# Patient Record
Sex: Male | Born: 1960 | Race: Black or African American | Hispanic: No | Smoking: Current every day smoker
Health system: Southern US, Community
[De-identification: ages and names within clinical notes are randomized; demographics above are authoritative.]

## PROBLEM LIST (undated history)

## (undated) DIAGNOSIS — W3400XA Accidental discharge from unspecified firearms or gun, initial encounter: Secondary | ICD-10-CM

## (undated) DIAGNOSIS — I472 Ventricular tachycardia: Secondary | ICD-10-CM

## (undated) DIAGNOSIS — J189 Pneumonia, unspecified organism: Secondary | ICD-10-CM

## (undated) DIAGNOSIS — D649 Anemia, unspecified: Secondary | ICD-10-CM

## (undated) DIAGNOSIS — F32A Depression, unspecified: Secondary | ICD-10-CM

## (undated) DIAGNOSIS — R000 Tachycardia, unspecified: Secondary | ICD-10-CM

## (undated) DIAGNOSIS — I440 Atrioventricular block, first degree: Secondary | ICD-10-CM

## (undated) DIAGNOSIS — I10 Essential (primary) hypertension: Secondary | ICD-10-CM

## (undated) DIAGNOSIS — I714 Abdominal aortic aneurysm, without rupture, unspecified: Secondary | ICD-10-CM

## (undated) DIAGNOSIS — R7989 Other specified abnormal findings of blood chemistry: Secondary | ICD-10-CM

## (undated) DIAGNOSIS — Z992 Dependence on renal dialysis: Secondary | ICD-10-CM

## (undated) DIAGNOSIS — I441 Atrioventricular block, second degree: Secondary | ICD-10-CM

## (undated) DIAGNOSIS — F329 Major depressive disorder, single episode, unspecified: Secondary | ICD-10-CM

## (undated) DIAGNOSIS — D696 Thrombocytopenia, unspecified: Secondary | ICD-10-CM

## (undated) DIAGNOSIS — B1920 Unspecified viral hepatitis C without hepatic coma: Secondary | ICD-10-CM

## (undated) DIAGNOSIS — N186 End stage renal disease: Secondary | ICD-10-CM

## (undated) DIAGNOSIS — H409 Unspecified glaucoma: Secondary | ICD-10-CM

## (undated) DIAGNOSIS — I639 Cerebral infarction, unspecified: Secondary | ICD-10-CM

## (undated) HISTORY — PX: ORIF CALCANEAL FRACTURE: SUR921

## (undated) HISTORY — PX: WISDOM TOOTH EXTRACTION: SHX21

## (undated) HISTORY — PX: FRACTURE SURGERY: SHX138

## (undated) HISTORY — PX: EXPLORATORY LAPAROTOMY: SUR591

## (undated) HISTORY — PX: OTHER SURGICAL HISTORY: SHX169

---

## 1980-10-23 HISTORY — PX: LUNG SURGERY: SHX703

## 2016-11-23 DIAGNOSIS — I639 Cerebral infarction, unspecified: Secondary | ICD-10-CM

## 2016-11-23 HISTORY — DX: Cerebral infarction, unspecified: I63.9

## 2016-11-23 HISTORY — PX: INSERTION OF DIALYSIS CATHETER: SHX1324

## 2016-12-10 ENCOUNTER — Inpatient Hospital Stay (HOSPITAL_COMMUNITY)
Admission: AD | Admit: 2016-12-10 | Discharge: 2016-12-14 | DRG: 682 | Discharge status: Against Medical Advice | Payer: Medicaid Other | Source: Other Acute Inpatient Hospital | Attending: Internal Medicine | Admitting: Internal Medicine

## 2016-12-10 ENCOUNTER — Encounter (HOSPITAL_COMMUNITY): Payer: Self-pay | Admitting: Internal Medicine

## 2016-12-10 DIAGNOSIS — I471 Supraventricular tachycardia: Secondary | ICD-10-CM | POA: Diagnosis present

## 2016-12-10 DIAGNOSIS — G9340 Encephalopathy, unspecified: Secondary | ICD-10-CM

## 2016-12-10 DIAGNOSIS — I161 Hypertensive emergency: Secondary | ICD-10-CM | POA: Diagnosis present

## 2016-12-10 DIAGNOSIS — Z0189 Encounter for other specified special examinations: Secondary | ICD-10-CM

## 2016-12-10 DIAGNOSIS — Z9114 Patient's other noncompliance with medication regimen: Secondary | ICD-10-CM

## 2016-12-10 DIAGNOSIS — I158 Other secondary hypertension: Secondary | ICD-10-CM | POA: Diagnosis present

## 2016-12-10 DIAGNOSIS — F17210 Nicotine dependence, cigarettes, uncomplicated: Secondary | ICD-10-CM | POA: Diagnosis present

## 2016-12-10 DIAGNOSIS — M311 Thrombotic microangiopathy, unspecified: Secondary | ICD-10-CM | POA: Diagnosis present

## 2016-12-10 DIAGNOSIS — I639 Cerebral infarction, unspecified: Secondary | ICD-10-CM

## 2016-12-10 DIAGNOSIS — E871 Hypo-osmolality and hyponatremia: Secondary | ICD-10-CM | POA: Diagnosis not present

## 2016-12-10 DIAGNOSIS — D696 Thrombocytopenia, unspecified: Secondary | ICD-10-CM | POA: Diagnosis present

## 2016-12-10 DIAGNOSIS — I6300 Cerebral infarction due to thrombosis of unspecified precerebral artery: Secondary | ICD-10-CM | POA: Diagnosis not present

## 2016-12-10 DIAGNOSIS — I10 Essential (primary) hypertension: Secondary | ICD-10-CM | POA: Diagnosis not present

## 2016-12-10 DIAGNOSIS — I248 Other forms of acute ischemic heart disease: Secondary | ICD-10-CM | POA: Diagnosis present

## 2016-12-10 DIAGNOSIS — B1920 Unspecified viral hepatitis C without hepatic coma: Secondary | ICD-10-CM | POA: Diagnosis present

## 2016-12-10 DIAGNOSIS — I472 Ventricular tachycardia: Secondary | ICD-10-CM | POA: Diagnosis not present

## 2016-12-10 DIAGNOSIS — R4781 Slurred speech: Secondary | ICD-10-CM | POA: Diagnosis present

## 2016-12-10 DIAGNOSIS — I441 Atrioventricular block, second degree: Secondary | ICD-10-CM | POA: Diagnosis present

## 2016-12-10 DIAGNOSIS — Z452 Encounter for adjustment and management of vascular access device: Secondary | ICD-10-CM

## 2016-12-10 DIAGNOSIS — R066 Hiccough: Secondary | ICD-10-CM | POA: Diagnosis present

## 2016-12-10 DIAGNOSIS — R17 Unspecified jaundice: Secondary | ICD-10-CM | POA: Diagnosis present

## 2016-12-10 DIAGNOSIS — R748 Abnormal levels of other serum enzymes: Secondary | ICD-10-CM | POA: Diagnosis not present

## 2016-12-10 DIAGNOSIS — R778 Other specified abnormalities of plasma proteins: Secondary | ICD-10-CM | POA: Diagnosis present

## 2016-12-10 DIAGNOSIS — N19 Unspecified kidney failure: Secondary | ICD-10-CM

## 2016-12-10 DIAGNOSIS — Z1389 Encounter for screening for other disorder: Secondary | ICD-10-CM

## 2016-12-10 DIAGNOSIS — N179 Acute kidney failure, unspecified: Secondary | ICD-10-CM | POA: Diagnosis present

## 2016-12-10 DIAGNOSIS — D589 Hereditary hemolytic anemia, unspecified: Secondary | ICD-10-CM | POA: Diagnosis present

## 2016-12-10 DIAGNOSIS — R7989 Other specified abnormal findings of blood chemistry: Secondary | ICD-10-CM

## 2016-12-10 DIAGNOSIS — R112 Nausea with vomiting, unspecified: Secondary | ICD-10-CM | POA: Diagnosis present

## 2016-12-10 DIAGNOSIS — N189 Chronic kidney disease, unspecified: Secondary | ICD-10-CM | POA: Diagnosis present

## 2016-12-10 DIAGNOSIS — I159 Secondary hypertension, unspecified: Secondary | ICD-10-CM | POA: Diagnosis not present

## 2016-12-10 DIAGNOSIS — R000 Tachycardia, unspecified: Secondary | ICD-10-CM

## 2016-12-10 HISTORY — DX: Essential (primary) hypertension: I10

## 2016-12-10 LAB — RETICULOCYTES
RBC.: 4.84 MIL/uL (ref 4.22–5.81)
RETIC COUNT ABSOLUTE: 72.6 10*3/uL (ref 19.0–186.0)
Retic Ct Pct: 1.5 % (ref 0.4–3.1)

## 2016-12-10 LAB — COMPREHENSIVE METABOLIC PANEL
ALBUMIN: 3.5 g/dL (ref 3.5–5.0)
ALT: 30 U/L (ref 17–63)
AST: 41 U/L (ref 15–41)
Alkaline Phosphatase: 57 U/L (ref 38–126)
Anion gap: 16 — ABNORMAL HIGH (ref 5–15)
BUN: 93 mg/dL — ABNORMAL HIGH (ref 6–20)
CHLORIDE: 100 mmol/L — AB (ref 101–111)
CO2: 20 mmol/L — AB (ref 22–32)
CREATININE: 8.67 mg/dL — AB (ref 0.61–1.24)
Calcium: 9.1 mg/dL (ref 8.9–10.3)
GFR calc non Af Amer: 6 mL/min — ABNORMAL LOW (ref >60)
GFR, EST AFRICAN AMERICAN: 7 mL/min — AB (ref >60)
GLUCOSE: 147 mg/dL — AB (ref 65–99)
Potassium: 3.6 mmol/L (ref 3.5–5.1)
SODIUM: 136 mmol/L (ref 135–145)
Total Bilirubin: 1.8 mg/dL — ABNORMAL HIGH (ref 0.3–1.2)
Total Protein: 6.1 g/dL — ABNORMAL LOW (ref 6.5–8.1)

## 2016-12-10 LAB — CBC WITH DIFFERENTIAL/PLATELET
BASOS ABS: 0 10*3/uL (ref 0.0–0.1)
Basophils Relative: 0 %
Eosinophils Absolute: 0 10*3/uL (ref 0.0–0.7)
Eosinophils Relative: 0 %
HEMATOCRIT: 40.9 % (ref 39.0–52.0)
Hemoglobin: 14.6 g/dL (ref 13.0–17.0)
LYMPHS ABS: 1.2 10*3/uL (ref 0.7–4.0)
LYMPHS PCT: 15 %
MCH: 30.2 pg (ref 26.0–34.0)
MCHC: 35.7 g/dL (ref 30.0–36.0)
MCV: 84.5 fL (ref 78.0–100.0)
MONO ABS: 1.1 10*3/uL — AB (ref 0.1–1.0)
Monocytes Relative: 14 %
NEUTROS ABS: 5.6 10*3/uL (ref 1.7–7.7)
Neutrophils Relative %: 72 %
Platelets: 55 10*3/uL — ABNORMAL LOW (ref 150–400)
RBC: 4.84 MIL/uL (ref 4.22–5.81)
RDW: 14 % (ref 11.5–15.5)
WBC: 7.8 10*3/uL (ref 4.0–10.5)

## 2016-12-10 LAB — MAGNESIUM: Magnesium: 1.9 mg/dL (ref 1.7–2.4)

## 2016-12-10 LAB — MRSA PCR SCREENING: MRSA by PCR: NEGATIVE

## 2016-12-10 LAB — TROPONIN I: Troponin I: 0.53 ng/mL (ref <0.03)

## 2016-12-10 LAB — TSH: TSH: 0.669 u[IU]/mL (ref 0.350–4.500)

## 2016-12-10 LAB — PROTIME-INR
INR: 1.03
Prothrombin Time: 13.6 seconds (ref 11.4–15.2)

## 2016-12-10 LAB — PHOSPHORUS: PHOSPHORUS: 4.7 mg/dL — AB (ref 2.5–4.6)

## 2016-12-10 LAB — VITAMIN B12: Vitamin B-12: 372 pg/mL (ref 180–914)

## 2016-12-10 LAB — LACTATE DEHYDROGENASE: LDH: 460 U/L — ABNORMAL HIGH (ref 98–192)

## 2016-12-10 LAB — TECHNOLOGIST SMEAR REVIEW

## 2016-12-10 LAB — LIPASE, BLOOD: Lipase: 33 U/L (ref 11–51)

## 2016-12-10 LAB — APTT: aPTT: 31 seconds (ref 24–36)

## 2016-12-10 LAB — LACTIC ACID, PLASMA: Lactic Acid, Venous: 2.5 mmol/L (ref 0.5–1.9)

## 2016-12-10 LAB — URIC ACID: Uric Acid, Serum: 8 mg/dL — ABNORMAL HIGH (ref 4.4–7.6)

## 2016-12-10 MED ORDER — FOLIC ACID 1 MG PO TABS
1.0000 mg | ORAL_TABLET | Freq: Every day | ORAL | Status: DC
  Administered 2016-12-10 – 2016-12-14 (×5): 1 mg via ORAL
  Filled 2016-12-10 (×5): qty 1

## 2016-12-10 MED ORDER — CLONIDINE HCL 0.1 MG PO TABS
0.1000 mg | ORAL_TABLET | Freq: Three times a day (TID) | ORAL | Status: DC
  Administered 2016-12-10 – 2016-12-14 (×9): 0.1 mg via ORAL
  Filled 2016-12-10 (×9): qty 1

## 2016-12-10 MED ORDER — ONDANSETRON HCL 4 MG/2ML IJ SOLN
4.0000 mg | Freq: Four times a day (QID) | INTRAMUSCULAR | Status: DC | PRN
  Administered 2016-12-12: 4 mg via INTRAVENOUS
  Filled 2016-12-10: qty 2

## 2016-12-10 MED ORDER — ACETAMINOPHEN 650 MG RE SUPP: 650.0000 mg | Freq: Four times a day (QID) | RECTAL | Status: DC | PRN

## 2016-12-10 MED ORDER — DOCUSATE SODIUM 100 MG PO CAPS
100.0000 mg | ORAL_CAPSULE | Freq: Two times a day (BID) | ORAL | Status: DC
  Administered 2016-12-10 – 2016-12-12 (×4): 100 mg via ORAL
  Filled 2016-12-10 (×4): qty 1

## 2016-12-10 MED ORDER — PROMETHAZINE HCL 25 MG/ML IJ SOLN: 12.5000 mg | Freq: Four times a day (QID) | INTRAMUSCULAR | Status: DC | PRN

## 2016-12-10 MED ORDER — ACETAMINOPHEN 325 MG PO TABS
650.0000 mg | ORAL_TABLET | Freq: Four times a day (QID) | ORAL | Status: DC | PRN
  Administered 2016-12-10 – 2016-12-12 (×3): 650 mg via ORAL
  Filled 2016-12-10 (×3): qty 2

## 2016-12-10 MED ORDER — ONDANSETRON HCL 4 MG PO TABS: 4.0000 mg | ORAL_TABLET | Freq: Four times a day (QID) | ORAL | Status: DC | PRN

## 2016-12-10 MED ORDER — NITROGLYCERIN IN D5W 200-5 MCG/ML-% IV SOLN
20.0000 ug/min | INTRAVENOUS | Status: DC
  Administered 2016-12-10 – 2016-12-11 (×2): 60 ug/min via INTRAVENOUS
  Filled 2016-12-10 (×2): qty 250

## 2016-12-10 MED ORDER — CHLORPROMAZINE HCL 25 MG/ML IJ SOLN
12.5000 mg | Freq: Three times a day (TID) | INTRAMUSCULAR | Status: DC | PRN
  Filled 2016-12-10: qty 0.5

## 2016-12-10 MED ORDER — HYDRALAZINE HCL 20 MG/ML IJ SOLN
10.0000 mg | INTRAMUSCULAR | Status: DC | PRN
  Administered 2016-12-12: 10 mg via INTRAVENOUS
  Filled 2016-12-10: qty 1

## 2016-12-10 MED ORDER — NICOTINE 14 MG/24HR TD PT24
14.0000 mg | MEDICATED_PATCH | TRANSDERMAL | Status: DC
  Administered 2016-12-10 – 2016-12-13 (×4): 14 mg via TRANSDERMAL
  Filled 2016-12-10 (×4): qty 1

## 2016-12-10 MED ORDER — TRAMADOL HCL 50 MG PO TABS: 50.0000 mg | ORAL_TABLET | Freq: Four times a day (QID) | ORAL | Status: DC | PRN

## 2016-12-10 MED ORDER — AMLODIPINE BESYLATE 5 MG PO TABS
5.0000 mg | ORAL_TABLET | Freq: Every day | ORAL | Status: DC
  Administered 2016-12-10 – 2016-12-11 (×2): 5 mg via ORAL
  Filled 2016-12-10 (×2): qty 1

## 2016-12-10 MED ORDER — ASPIRIN EC 325 MG PO TBEC
325.0000 mg | DELAYED_RELEASE_TABLET | Freq: Every day | ORAL | Status: DC
  Administered 2016-12-10 – 2016-12-12 (×3): 325 mg via ORAL
  Filled 2016-12-10 (×3): qty 1

## 2016-12-10 MED ORDER — SODIUM CHLORIDE 0.9% FLUSH
3.0000 mL | Freq: Two times a day (BID) | INTRAVENOUS | Status: DC
  Administered 2016-12-10 – 2016-12-14 (×8): 3 mL via INTRAVENOUS
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
## 2016-12-11 ENCOUNTER — Inpatient Hospital Stay (HOSPITAL_COMMUNITY): Payer: Medicaid Other

## 2016-12-11 ENCOUNTER — Encounter (HOSPITAL_COMMUNITY): Payer: Self-pay | Admitting: Emergency Medicine

## 2016-12-11 DIAGNOSIS — I159 Secondary hypertension, unspecified: Secondary | ICD-10-CM

## 2016-12-11 DIAGNOSIS — N179 Acute kidney failure, unspecified: Principal | ICD-10-CM

## 2016-12-11 DIAGNOSIS — D696 Thrombocytopenia, unspecified: Secondary | ICD-10-CM

## 2016-12-11 LAB — RENAL FUNCTION PANEL
ALBUMIN: 3.4 g/dL — AB (ref 3.5–5.0)
Anion gap: 12 (ref 5–15)
BUN: 97 mg/dL — AB (ref 6–20)
CO2: 22 mmol/L (ref 22–32)
CREATININE: 9.12 mg/dL — AB (ref 0.61–1.24)
Calcium: 9.2 mg/dL (ref 8.9–10.3)
Chloride: 100 mmol/L — ABNORMAL LOW (ref 101–111)
GFR calc Af Amer: 7 mL/min — ABNORMAL LOW (ref >60)
GFR calc non Af Amer: 6 mL/min — ABNORMAL LOW (ref >60)
Glucose, Bld: 119 mg/dL — ABNORMAL HIGH (ref 65–99)
PHOSPHORUS: 4.5 mg/dL (ref 2.5–4.6)
POTASSIUM: 3.7 mmol/L (ref 3.5–5.1)
Sodium: 134 mmol/L — ABNORMAL LOW (ref 135–145)

## 2016-12-11 LAB — URINALYSIS, COMPLETE (UACMP) WITH MICROSCOPIC
Bilirubin Urine: NEGATIVE
GLUCOSE, UA: NEGATIVE mg/dL
KETONES UR: NEGATIVE mg/dL
NITRITE: NEGATIVE
PH: 5 (ref 5.0–8.0)
Protein, ur: >=300 mg/dL — AB
Specific Gravity, Urine: 1.012 (ref 1.005–1.030)

## 2016-12-11 LAB — ABO/RH: ABO/RH(D): O POS

## 2016-12-11 LAB — RAPID URINE DRUG SCREEN, HOSP PERFORMED
Amphetamines: NOT DETECTED
BARBITURATES: NOT DETECTED
BENZODIAZEPINES: NOT DETECTED
Cocaine: NOT DETECTED
Opiates: NOT DETECTED
Tetrahydrocannabinol: POSITIVE — AB

## 2016-12-11 LAB — TYPE AND SCREEN
ABO/RH(D): O POS
Antibody Screen: NEGATIVE

## 2016-12-11 LAB — CBC
HEMATOCRIT: 39.2 % (ref 39.0–52.0)
Hemoglobin: 13.9 g/dL (ref 13.0–17.0)
MCH: 30.2 pg (ref 26.0–34.0)
MCHC: 35.5 g/dL (ref 30.0–36.0)
MCV: 85.2 fL (ref 78.0–100.0)
PLATELETS: 71 10*3/uL — AB (ref 150–400)
RBC: 4.6 MIL/uL (ref 4.22–5.81)
RDW: 14.5 % (ref 11.5–15.5)
WBC: 8.6 10*3/uL (ref 4.0–10.5)

## 2016-12-11 LAB — PROTEIN / CREATININE RATIO, URINE
CREATININE, URINE: 106.59 mg/dL
Protein Creatinine Ratio: 3.1 mg/mg{Cre} — ABNORMAL HIGH (ref 0.00–0.15)
Total Protein, Urine: 330 mg/dL

## 2016-12-11 LAB — OSMOLALITY, URINE: Osmolality, Ur: 348 mOsm/kg (ref 300–900)

## 2016-12-11 LAB — HEMOGLOBIN A1C
HEMOGLOBIN A1C: 5.2 % (ref 4.8–5.6)
MEAN PLASMA GLUCOSE: 103 mg/dL

## 2016-12-11 LAB — HEPATITIS B SURFACE ANTIGEN: HEP B S AG: NEGATIVE

## 2016-12-11 LAB — TROPONIN I
Troponin I: 0.46 ng/mL (ref <0.03)
Troponin I: 0.5 ng/mL (ref <0.03)

## 2016-12-11 LAB — SAVE SMEAR

## 2016-12-11 LAB — SODIUM, URINE, RANDOM: SODIUM UR: 38 mmol/L

## 2016-12-11 LAB — OSMOLALITY: OSMOLALITY: 327 mosm/kg — AB (ref 275–295)

## 2016-12-11 LAB — FIBRINOGEN: FIBRINOGEN: 274 mg/dL (ref 210–475)

## 2016-12-11 LAB — CREATININE, URINE, RANDOM: Creatinine, Urine: 107 mg/dL

## 2016-12-11 MED ORDER — AMLODIPINE BESYLATE 2.5 MG PO TABS
2.5000 mg | ORAL_TABLET | Freq: Once | ORAL | Status: AC
  Administered 2016-12-11: 2.5 mg via ORAL
  Filled 2016-12-11: qty 1

## 2016-12-11 MED ORDER — AMLODIPINE BESYLATE 10 MG PO TABS
10.0000 mg | ORAL_TABLET | Freq: Every day | ORAL | Status: DC
  Administered 2016-12-12 – 2016-12-14 (×3): 10 mg via ORAL
  Filled 2016-12-11 (×3): qty 1

## 2016-12-11 MED ORDER — HYDRALAZINE HCL 25 MG PO TABS
25.0000 mg | ORAL_TABLET | Freq: Four times a day (QID) | ORAL | Status: DC
  Administered 2016-12-11: 25 mg via ORAL
  Filled 2016-12-11: qty 1

## 2016-12-11 MED ORDER — ISOSORBIDE MONONITRATE ER 30 MG PO TB24
30.0000 mg | ORAL_TABLET | Freq: Every day | ORAL | Status: DC
  Administered 2016-12-11 – 2016-12-12 (×2): 30 mg via ORAL
  Filled 2016-12-11 (×2): qty 1

## 2016-12-11 MED ORDER — HYDRALAZINE HCL 50 MG PO TABS
50.0000 mg | ORAL_TABLET | Freq: Three times a day (TID) | ORAL | Status: DC
  Administered 2016-12-11 – 2016-12-14 (×9): 50 mg via ORAL
  Filled 2016-12-11 (×9): qty 1

## 2016-12-11 MED ORDER — BOOST / RESOURCE BREEZE PO LIQD
1.0000 | Freq: Three times a day (TID) | ORAL | Status: DC
  Administered 2016-12-11 – 2016-12-14 (×6): 1 via ORAL

## 2016-12-11 NOTE — Progress Notes (Signed)
Initial Nutrition Assessment  DOCUMENTATION CODES:   Not applicable  INTERVENTION:    Boost Breeze po TID, each supplement provides 250 kcal and 9 grams of protein  NUTRITION DIAGNOSIS:   Increased nutrient needs related to acute illness as evidenced by estimated needs  GOAL:   Patient will meet greater than or equal to 90% of their needs  MONITOR:   PO intake, Supplement acceptance, Labs, Weight trends, I & O's  REASON FOR ASSESSMENT:   Consult  (weight loss)  ASSESSMENT:   21M with severe renal failure (chronicity unknown -- presumably acute though), TCP, malignant HTN.  Worry is for Acute RPGN esp a TMA.  TTP a possibility with renal failure and TCP.  Other GN also possible.  HTN Emergency can also be present here but pt seems to have been normotensive in past month but not check in past week or two.    Pt reports a poor appetite PTA; vomiting for 3 days. States he's lost 18 lbs in the last 6 days >> ? accuracy  Tolerating Clear Liquids >> PO intake 50% per flowsheet records. Would benefit from oral nutrition supplements >> pt amenable. Unable to complete Nutrition Focused Physical Exam at this time.  Diet Order:  Diet clear liquid Room service appropriate? Yes; Fluid consistency: Thin  Skin:  Reviewed, no issues  Last BM:  2/15  Height:   Ht Readings from Last 1 Encounters:  12/10/16 6\' 3"  (1.905 m)    Weight:   Wt Readings from Last 1 Encounters:  12/11/16 174 lb 13.2 oz (79.3 kg)    Ideal Body Weight:  89 kg  BMI:  Body mass index is 21.85 kg/m.  Estimated Nutritional Needs:   Kcal:  2000-2200  Protein:  100-110 gm  Fluid:  2.0-2.2 L  EDUCATION NEEDS:   No education needs identified at this time  Arthur Holms, RD, LDN Pager #: [phone] After-Hours Pager #: [phone]

## 2016-12-11 NOTE — Care Management Note (Signed)
Case Management Note  Patient Details  Name: Bret Vanessen MRN: [id number] Date of Birth: 02/15/1961  Subjective/Objective:    Presents with AKI, uremia, htn emergency, thrombocytopenia, elevated bili, elevated trops, n/v, conts on nitro drip, he is a English as a second language teacher, he goes to [facility] for his medications and he PCP is Dr. Durene Romans.  He also has medicaid, so if need to get any medications locally ,his medicaid will cover.  He has a cane and crutches and lives in [place], he states one of his friends will transport him home when he is ready for discharge. NCM will cont to follow for dc needs.               Action/Plan:   Expected Discharge Date:                  Expected Discharge Plan:  Home/Self Care  In-House Referral:     Discharge planning Services  CM Consult  Post Acute Care Choice:    Choice offered to:     DME Arranged:    DME Agency:     HH Arranged:    HH Agency:     Status of Service:  In process, will continue to follow  If discussed at Long Length of Stay Meetings, dates discussed:    Additional Comments:  Zenon Mayo, RN 12/11/2016, 3:20 PM

## 2016-12-11 NOTE — Consult Note (Signed)
Frank Fuller Admit Date: 12/10/2016 12/11/2016 Frank Fuller Requesting Physician:  Frank Pronto MD  Reason for Consult:  Renal Failure, HTN HPI:  62M seen at request of Dr. Posey Fuller for above concerns.  PMH Incudes:  HTN: says took daily BPs at home showing normal BPs (SBP 120s) and PMD stopped all BP meds aboue 3-4 weeks ago  No hx/o DM2, FHx renal disease, Thrombocytopenia, known renal disease  About one week ago developed occiptal headaches and then in past sev days N/V without diarrhea.  Having some hiccups.  On / off abdominal pain.  No rashes, arthralgias.  No note of confusion.  Denies F/C.  No change in color or quanity of urine.  Denies NSAIDs. Not on ACEi.  At OSH ED BP was very elevated req NTG gtt.  Rec sev liters of IV NS given concern for hypovolemia.  CT A/P at OSH w/o urinary obstruction.    Pt with TCP PLT count 50 --> 70.  Nl coags.  LDH elevated in 400s.  Hb normal.  BP remains high on NTG gtt.  Other meds include amlodipine, clonidine, hydralazine, ISMN.    No current HA.    UA with TNTC RBC/HPF, 4+ protein, 6-30 WBC/HPF; URine sediment with mostly numerous monomorphic RBC.  Scattered pigmented granular debris and sev pigmented granular casts. No cellular casts.  +RTEs and WBC present   CBC does not note schistocytes.     Creatinine, Ser (mg/dL)  Date Value  12/11/2016 9.12 (H)  12/10/2016 8.67 (H)  ] I/Os:  ROS NSAIDS: denies exposure IV Contrast no exposure TMP/SMX no exposure Hypotension none Balance of 12 systems is negative w/ exceptions as above  PMH  Past Medical History:  Diagnosis Date  . Hypertension    PSH History reviewed. No pertinent surgical history. FH  Family History  Problem Relation Age of Onset  . Hypertension Mother    SH  reports that he has been smoking Cigarettes.  He does not have any smokeless tobacco history on file. His alcohol and drug histories are not on file. Allergies No Known Allergies Home medications Prior to  Admission medications   Medication Sig Start Date End Date Taking? Authorizing Provider  Acetaminophen (TYLENOL PO) Take 2 tablets by mouth every 6 (six) hours as needed (pain/headache).   Yes Historical Provider, MD  amLODipine (NORVASC) 10 MG tablet Take 10 mg by mouth daily.   Yes Historical Provider, MD    Current Medications Scheduled Meds: . amLODipine  5 mg Oral Daily  . aspirin EC  325 mg Oral Daily  . cloNIDine  0.1 mg Oral TID  . docusate sodium  100 mg Oral BID  . folic acid  1 mg Oral Daily  . hydrALAZINE  25 mg Oral Q6H  . isosorbide mononitrate  30 mg Oral Daily  . nicotine  14 mg Transdermal Q24H  . sodium chloride flush  3 mL Intravenous Q12H   Continuous Infusions: . nitroGLYCERIN 60 mcg/min (12/10/16 1857)   PRN Meds:.acetaminophen **OR** acetaminophen, chlorproMAZINE (THORAZINE) IV, hydrALAZINE, ondansetron **OR** ondansetron (ZOFRAN) IV, promethazine  CBC  Recent Labs Lab 12/10/16 1845 12/11/16 0705  WBC 7.8 8.6  NEUTROABS 5.6  --   HGB 14.6 13.9  HCT 40.9 39.2  MCV 84.5 85.2  PLT 55* 71*   Basic Metabolic Panel  Recent Labs Lab 12/10/16 1845 12/11/16 0705  NA 136 134*  K 3.6 3.7  CL 100* 100*  CO2 20* 22  GLUCOSE 147* 119*  BUN 93* 97*  CREATININE 8.67* 9.12*  CALCIUM 9.1 9.2  PHOS 4.7* 4.5    Physical Exam  Blood pressure (!) 161/107, pulse 86, temperature 98.1 F (36.7 C), temperature source Oral, resp. rate 18, height 6\' 3"  (1.905 m), weight 79.3 kg (174 lb 13.2 oz), SpO2 100 %. GEN: NAD, thin, nl muscle/bulk ENT: NCAT EYES: EOMI, on injection or icterus CV: RRR, nl s1s2, no rub PULM: CTAB, nl wob ABD: s/nt/nd. No abd bruits. No HSM.  +BS SKIN: no rashes/lesions. No petechiae / purpura EXT:no LEE   Assessment 23M with severe renal failure (chronicity unknown -- presumably acute though), TCP, malignant HTN.  Worry is for Acute RPGN esp a TMA.  TTP a possibility with renal failure and TCP.  Other GN also possible.  HTN Emergency  can also be present here but pt seems to have been normotensive in past month but not check in past week or two.    1. Renal Failure, presumed acute and possibly RPGN, worrisome for TMA perhaps from HTN or TTP 2. Malignant HTN 3. TCP, nl coags.   Plan 1. Full serologies 2. ADAMTS13 level, haptoblogin; discussed with Dr. Julien Fuller of hematology who will see patient 3. Will likely need renal biopsy when feasible 4. Renal US, UP/C 5. Likely to need dialysis in next 24-48h 6. Hold on further hydration 7. uptitrate amlodpine and hydralazine to ween NTG gtt, target SBP < 160   Frank Grippe MD [phone] pgr 12/11/2016, 1:14 PM

## 2016-12-11 NOTE — Progress Notes (Signed)
[place] [phone]   [phone]  CONSULT NOTE  REFERRING PHYSICIAN: Dr. [name]  REASON FOR CONSULTATION:  56 years old African-American male with thrombocytopenia.  HPI Berley Gambrell is a 56 y.o. male with no significant past medical history except for hypertension and he was not on any medications. The patient mentioned that few days ago he started having severe headache followed by nausea and vomiting for 3 days. He also has generalized malaise. He finally presented to the emergency department at [facility] for evaluation and was found to have malignant hypertension in addition to acute renal failure. He was transferred to [facility] for further evaluation and management of his condition. CBC on the day of admission showed normal white blood count of 7.8, hemoglobin 14.6, hematocrit 40.9 and platelets count were low at 55,000. Peripheral blood smear was performed and it showed burst cells as well as a small bite cells and large platelets but no schistocytes. His comprehensive metabolic panel showed creatinine of 8.67. The patient was started on IV hydration. He was seen by nephrology and renal ultrasound showed increased renal cortical echotexture consistent with medical renal disease. There is no hydronephrosis. Repeat CBC today. Showed improvement of the platelets count to 71,000. No evidence of hemolytic anemia. Dr. Joelyn Oms was concerned about the possibility of TTP and he kindly asked me to see the patient today for evaluation of this condition. When seen today the patient is feeling fine with no specific complaints. He denied having any fever or chills. He has no current neurological abnormalities. He was able to give full history of his condition. He denied having any chest pain, shortness of breath, cough or hemoptysis. He has no current nausea or vomiting. He denied having any bleeding, bruises or ecchymosis. Family history is  unremarkable for any hematological disorder or malignancy. The patient is single and has 2 children. He works as a Hospital doctor. He has a history of smoking for around 37 years. He also drinks alcohol at regular basis. He denied having any drug abuse.  HPI  Past Medical History:  Diagnosis Date  . Hypertension     History reviewed. No pertinent surgical history.  Family History  Problem Relation Age of Onset  . Hypertension Mother     Social History Social History  Substance Use Topics  . Smoking status: Current Every Day Smoker    Types: Cigarettes  . Smokeless tobacco: Not on file  . Alcohol use Not on file    No Known Allergies  Current Facility-Administered Medications  Medication Dose Route Frequency Provider Last Rate Last Dose  . acetaminophen (TYLENOL) tablet 650 mg  650 mg Oral Q6H PRN Lavina Hamman, MD   650 mg at 12/10/16 2144   Or  . acetaminophen (TYLENOL) suppository 650 mg  650 mg Rectal Q6H PRN Lavina Hamman, MD      . Derrill Memo ON 12/12/2016] amLODipine (NORVASC) tablet 10 mg  10 mg Oral Daily Lavina Hamman, MD      . aspirin EC tablet 325 mg  325 mg Oral Daily Lavina Hamman, MD   325 mg at 12/11/16 4431  . chlorproMAZINE (THORAZINE) 12.5 mg in sodium chloride 0.9 % 25 mL IVPB  12.5 mg Intravenous Q8H PRN Lavina Hamman, MD      . cloNIDine (CATAPRES) tablet 0.1 mg  0.1 mg Oral TID Lavina Hamman, MD   0.1 mg at 12/11/16 1515  . docusate sodium (  COLACE) capsule 100 mg  100 mg Oral BID Lavina Hamman, MD   100 mg at 12/11/16 3428  . feeding supplement (BOOST / RESOURCE BREEZE) liquid 1 Container  1 Container Oral TID BM Lavina Hamman, MD   1 Container at 12/11/16 1515  . folic acid (FOLVITE) tablet 1 mg  1 mg Oral Daily Lavina Hamman, MD   1 mg at 12/11/16 7681  . hydrALAZINE (APRESOLINE) injection 10 mg  10 mg Intravenous Q4H PRN Lavina Hamman, MD      . hydrALAZINE (APRESOLINE) tablet 50 mg  50 mg Oral Q8H Lavina Hamman, MD   50 mg at 12/11/16 1655  .  isosorbide mononitrate (IMDUR) 24 hr tablet 30 mg  30 mg Oral Daily Lavina Hamman, MD   30 mg at 12/11/16 0810  . nicotine (NICODERM CQ - dosed in mg/24 hours) patch 14 mg  14 mg Transdermal Q24H Lavina Hamman, MD   14 mg at 12/10/16 2142  . nitroGLYCERIN 50 mg in dextrose 5 % 250 mL (0.2 mg/mL) infusion  20-200 mcg/min Intravenous Titrated Lavina Hamman, MD 18 mL/hr at 12/10/16 1857 60 mcg/min at 12/10/16 1857  . ondansetron (ZOFRAN) tablet 4 mg  4 mg Oral Q6H PRN Lavina Hamman, MD       Or  . ondansetron [facility]) injection 4 mg  4 mg Intravenous Q6H PRN Lavina Hamman, MD      . promethazine (PHENERGAN) injection 12.5 mg  12.5 mg Intravenous Q6H PRN Lavina Hamman, MD      . sodium chloride flush (NS) 0.9 % injection 3 mL  3 mL Intravenous Q12H Lavina Hamman, MD   3 mL at 12/11/16 1559    Review of Systems  Constitutional: negative Eyes: negative Ears, nose, mouth, throat, and face: negative Respiratory: negative Cardiovascular: negative Gastrointestinal: negative Genitourinary:negative Integument/breast: negative Hematologic/lymphatic: negative Musculoskeletal:negative Neurological: negative Behavioral/Psych: negative Endocrine: negative Allergic/Immunologic: negative  Physical Exam  LXB:WIOMB, healthy, no distress, well nourished and well developed SKIN: skin color, texture, turgor are normal, no rashes or significant lesions HEAD: Normocephalic, No masses, lesions, tenderness or abnormalities EYES: normal, PERRLA, Conjunctiva are pink and non-injected EARS: External ears normal, Canals clear OROPHARYNX:no exudate, no erythema and lips, buccal mucosa, and tongue normal  NECK: supple, no adenopathy, no JVD LYMPH:  no palpable lymphadenopathy, no hepatosplenomegaly LUNGS: clear to auscultation , and palpation HEART: regular rate & rhythm, no murmurs and no gallops ABDOMEN:abdomen soft, non-tender, normal bowel sounds and no masses or organomegaly BACK: Back symmetric,  no curvature., No CVA tenderness EXTREMITIES:no joint deformities, effusion, or inflammation, no edema, no skin discoloration  NEURO: alert & oriented x 3 with fluent speech, no focal motor/sensory deficits  PERFORMANCE STATUS: ECOG 1  LABORATORY DATA: Lab Results  Component Value Date   WBC 8.6 12/11/2016   HGB 13.9 12/11/2016   HCT 39.2 12/11/2016   MCV 85.2 12/11/2016   PLT 71 (L) 12/11/2016    @LASTCHEM @  RADIOGRAPHIC STUDIES: Dg Chest 2 View  Result Date: 12/11/2016 CLINICAL DATA:  Acute renal failure. No current cardiopulmonary complaints. Smoker, history of hypertension EXAM: CHEST  2 VIEW COMPARISON:  None in PACs FINDINGS: The lungs are adequately inflated and clear. The heart and pulmonary vascularity are normal. There is calcification in the wall of the thoracic aorta. There is no pleural effusion. The mediastinum is normal in width. The bony thorax exhibits no acute abnormality. IMPRESSION: There is acute cardiopulmonary abnormality. Thoracic aortic  atherosclerosis. Electronically Signed   By: David  Martinique M.D.   On: 12/11/2016 12:42   US Renal  Result Date: 12/11/2016 CLINICAL DATA:  Acute renal insufficiency. EXAM: RENAL / URINARY TRACT ULTRASOUND COMPLETE COMPARISON:  None in PACs FINDINGS: Right Kidney: Length: 10.4 cm. The renal cortical echotexture is increased and is greater than that of the adjacent liver. There is no hydronephrosis. Left Kidney: Length: 10.1 cm. The renal cortical echotexture is increased similar to that on the right. There is a lower pole simple appearing cyst measuring 1.2 cm in greatest dimension. There is no hydronephrosis. Bladder: The urinary bladder is decompressed by a Foley catheter. IMPRESSION: Increased renal cortical echotexture consistent with medical renal disease. No hydronephrosis. The urinary bladder is decompressed by a Foley catheter. Electronically Signed   By: David  Martinique M.D.   On: 12/11/2016 13:12    ASSESSMENT AND PLAN: This  is a very pleasant 56 years old African-American male who was admitted with malignant hypertension in addition to acute renal failure and thrombocytopenia. I'm seeing the patient today for evaluation of questionable TTP. Based on the available data and blood smear, I don't see any convincing findings for TTP but this cannot be completely excluded.  I will wait for the ADAMTS13 test results. We will continue to monitor his CBC closely but the patient has improvement of his platelets count. For the acute renal failure and hypertension, he is followed by nephrology and expected to have biopsy of the kidney for evaluation of his acute renal failure. The patient voices understanding of current disease status and treatment options and is in agreement with the current care plan.  All questions were answered. The patient knows to call the clinic with any problems, questions or concerns. We can certainly see the patient much sooner if necessary.  Thank you so much for allowing me to participate in the care of Frank Fuller. I will continue to follow up the patient with you and assist in his care.  Disclaimer: This note was dictated with voice recognition software. Similar sounding words can inadvertently be transcribed and may not be corrected upon review.   Candyce Gambino K. December 11, 2016, 5:45 PM

## 2016-12-11 NOTE — Progress Notes (Signed)
[facility] Progress Note  Patient: Frank Fuller CLE:[id number]   PCP: No primary care provider on file. DOB: [birth year]   DOA: 12/10/2016   DOS: 12/11/2016   Date of Service: the patient was seen and examined on 12/11/2016   Subjective: Feeling better, no nausea no hiccups. No chest pain. No shortness of breath. No swelling of the leg.  Brief hospital course: Pt. with PMH of hypertension; admitted on 12/10/2016, with complaint of nausea vomiting, hiccups, generalized malaise, was found to have acute kidney injury and thrombocytopenia. Nephrology consulted who in turn has consulted hematology at present. Currently further plan is further workup and supportive management.  Assessment and Plan: 1. AKI (acute kidney injury) ([place])  Uremia with hiccups and nausea. Patient presents with nausea vomiting and general malaise. Found to have a creatinine of 8.8 with BUN off 85. Also has hypertensive emergency. CT abdomen shows no evidence of renal obstruction. This is probably combination of hypertensive nephropathy and prerenal etiology with poor oral intake. UA was bland in other facility, here has proteinuria. Not taking any ACE inhibitor or NSAIDs Currently has a Foley catheter which was inserted in the other facility which I will continue. Patient has received 3 liters of normal saline bolus in the other facility. Avoiding aggressive IV hydration in the setting of hypertensive emergency. Nephrology consulted, no indication for acute hemodialysis as does not have evidence of severe volume overload, acute encephalopathy or hyperkalemia. with uremic symptoms if AKI does not improve may require hemodialysis. Thrombocytopenia is concerning in the setting of acute kidney injury although the patient does not appear to have any active sepsis and does not appear toxic. Therefore TTP HUS DIC is low on differential. Checking SPEP, hepatitis B, and C, HIV. Normal uric acid levels, elevated serum  osmolarity, 348 urine osmolarity Fena 2.4, appears intrinsic  May need HD in a few days.   2. Hypertensive emergency. In the setting of not taking his blood pressure medication for last 3 weeks. Patient has received enalaprilat, hydralazine, oral Lopressor. Was started on nitroglycerin infusion in the other facility I would stop nitroglycerin infusion, add imdur and hydralazine. Continue clonidine 0.1 mg 3 times a day, Norvasc 5 mg daily. Use when necessary IV hydralazine for acute blood pressure changes. avoiding ACE inhibitor. No evidence of volume overload or acute CHF at present. No evidence of encephalopathy.  3. Thrombocytopenia. Etiology currently unclear. Patient denies having any active bleeding. Patient denies any issues with his platelets in the past. Normal INR, APTT, pending fibrinogen level, hematology consulted by nephrology, will monitor.  Technologist smear review does not show any schistocytes. Avoiding pharmacological DVT prophylaxis in the setting of platelets less then 100. Using mechanical compression.  4. Elevated bilirubin. Etiology currently unclear, will monitor and recheck. No evidence of AST ALT elevation or ALP Elevation. No right upper quadrant abdominal pain as well.  5. Elevated troponin. Elevated NT-proBNP EKG shows T-wave inversions in lead V4 to V6 and aVL. No ST elevation. This is likely in the setting of hypertensive emergency.  Also possible etiology of elevation of cardiac enzymes is acute kidney injury causing poor clearance. Patient denies having any compressive chest pain or shortness of breath. I do not suspect that he has any ongoing active ACS or any acute CHF. Since hypertensive emergency causing demand ischemia/non-STEMI cannot be ruled out, I would add aspirin 325 mg daily once platelets are stable. Check echocardiogram. Monitor on telemetry. May require cardiology consult if he has any abnormalities on above workup. UDS  rule  out cocaine.  6. Hiccups. When necessary Thorazine.  7. Nausea and vomiting. CT abdomen unremarkable. UA unremarkable. Normal lipase level, lactic acid level. When necessary Zofran, when necessary Phenergan for refractory nausea.  Bowel regimen: last BM 12/07/2016 Diet: renal diet DVT Prophylaxis: mechanical compression device  Advance goals of care discussion: full code  Family Communication: no family was present at bedside, at the time of interview.   Disposition:  Discharge to home. Expected discharge date: 12/20/2016, stabilization of the renal function  Consultants: nephrology, hematology Procedures: echocardiogram  Antibiotics: Anti-infectives    None     Objective: Physical Exam: Vitals:   12/11/16 0810 12/11/16 0900 12/11/16 1200 12/11/16 1205  BP: (!) 187/118 (!) 168/95  (!) 161/107  Pulse:  79 78 86  Resp:  17 16 18   Temp:    98.1 F (36.7 C)  TempSrc:    Oral  SpO2:  100% 100% 100%  Weight:      Height:        Intake/Output Summary (Last 24 hours) at 12/11/16 1447 Last data filed at 12/11/16 1217  Gross per 24 hour  Intake              480 ml  Output              850 ml  Net             -370 ml   Filed Weights   12/10/16 1742 12/11/16 0500  Weight: 78.8 kg (173 lb 11.2 oz) 79.3 kg (174 lb 13.2 oz)   General: Alert, Awake and Oriented to Time, Place and Person. Appear in mild distress, affect appropriate Eyes: PERRL, Conjunctiva normal ENT: Oral Mucosa clear moist. Neck: no JVD, no Abnormal Mass Or lumps Cardiovascular: S1 and S2 Present, no Murmur, Respiratory: Bilateral Air entry equal and Decreased, no use of accessory muscle, Clear to Auscultation, no Crackles, no wheezes Abdomen: Bowel Sound present, Soft and no tenderness Skin: no redness, no Rash, no induration Extremities: no Pedal edema, no calf tenderness Neurologic: Grossly no focal neuro deficit. Bilaterally Equal motor strength  Data Reviewed: CBC:  Recent Labs Lab  12/10/16 1845 12/11/16 0705  WBC 7.8 8.6  NEUTROABS 5.6  --   HGB 14.6 13.9  HCT 40.9 39.2  MCV 84.5 85.2  PLT 55* 71*   Basic Metabolic Panel:  Recent Labs Lab 12/10/16 1845 12/11/16 0705  NA 136 134*  K 3.6 3.7  CL 100* 100*  CO2 20* 22  GLUCOSE 147* 119*  BUN 93* 97*  CREATININE 8.67* 9.12*  CALCIUM 9.1 9.2  MG 1.9  --   PHOS 4.7* 4.5   Liver Function Tests:  Recent Labs Lab 12/10/16 1845 12/11/16 0705  AST 41  --   ALT 30  --   ALKPHOS 57  --   BILITOT 1.8*  --   PROT 6.1*  --   ALBUMIN 3.5 3.4*    Recent Labs Lab 12/10/16 1938  LIPASE 33   No results for input(s): AMMONIA in the last 168 hours. Coagulation Profile:  Recent Labs Lab 12/10/16 1845  INR 1.03   Cardiac Enzymes:  Recent Labs Lab 12/10/16 1845 12/10/16 2355 12/11/16 0705  TROPONINI 0.53* 0.50* 0.46*   BNP (last 3 results) No results for input(s): PROBNP in the last 8760 hours.  CBG: No results for input(s): GLUCAP in the last 168 hours.  Studies: Dg Chest 2 View  Result Date: 12/11/2016 CLINICAL DATA:  Acute renal failure. No  current cardiopulmonary complaints. Smoker, history of hypertension EXAM: CHEST  2 VIEW COMPARISON:  None in PACs FINDINGS: The lungs are adequately inflated and clear. The heart and pulmonary vascularity are normal. There is calcification in the wall of the thoracic aorta. There is no pleural effusion. The mediastinum is normal in width. The bony thorax exhibits no acute abnormality. IMPRESSION: There is acute cardiopulmonary abnormality. Thoracic aortic atherosclerosis. Electronically Signed   By: David  Martinique M.D.   On: 12/11/2016 12:42   US Renal  Result Date: 12/11/2016 CLINICAL DATA:  Acute renal insufficiency. EXAM: RENAL / URINARY TRACT ULTRASOUND COMPLETE COMPARISON:  None in PACs FINDINGS: Right Kidney: Length: 10.4 cm. The renal cortical echotexture is increased and is greater than that of the adjacent liver. There is no hydronephrosis. Left  Kidney: Length: 10.1 cm. The renal cortical echotexture is increased similar to that on the right. There is a lower pole simple appearing cyst measuring 1.2 cm in greatest dimension. There is no hydronephrosis. Bladder: The urinary bladder is decompressed by a Foley catheter. IMPRESSION: Increased renal cortical echotexture consistent with medical renal disease. No hydronephrosis. The urinary bladder is decompressed by a Foley catheter. Electronically Signed   By: David  Martinique M.D.   On: 12/11/2016 13:12   Scheduled Meds: . amLODipine  5 mg Oral Daily  . aspirin EC  325 mg Oral Daily  . cloNIDine  0.1 mg Oral TID  . docusate sodium  100 mg Oral BID  . feeding supplement  1 Container Oral TID BM  . folic acid  1 mg Oral Daily  . hydrALAZINE  25 mg Oral Q6H  . isosorbide mononitrate  30 mg Oral Daily  . nicotine  14 mg Transdermal Q24H  . sodium chloride flush  3 mL Intravenous Q12H   Continuous Infusions: . nitroGLYCERIN 60 mcg/min (12/10/16 1857)   PRN Meds: acetaminophen **OR** acetaminophen, chlorproMAZINE (THORAZINE) IV, hydrALAZINE, ondansetron **OR** ondansetron (ZOFRAN) IV, promethazine  Time spent: 30 minutes  Author: Berle Mull, MD [facility] Pager: [phone] 12/11/2016 2:47 PM  If 7PM-7AM, please contact night-coverage at [email or web address], password [facility]

## 2016-12-12 ENCOUNTER — Inpatient Hospital Stay (HOSPITAL_COMMUNITY): Payer: Medicaid Other

## 2016-12-12 DIAGNOSIS — M311 Thrombotic microangiopathy, unspecified: Secondary | ICD-10-CM | POA: Diagnosis present

## 2016-12-12 DIAGNOSIS — I10 Essential (primary) hypertension: Secondary | ICD-10-CM

## 2016-12-12 DIAGNOSIS — R7989 Other specified abnormal findings of blood chemistry: Secondary | ICD-10-CM

## 2016-12-12 LAB — CBC
HEMATOCRIT: 32.4 % — AB (ref 39.0–52.0)
HEMOGLOBIN: 11.6 g/dL — AB (ref 13.0–17.0)
MCH: 30.4 pg (ref 26.0–34.0)
MCHC: 35.8 g/dL (ref 30.0–36.0)
MCV: 85 fL (ref 78.0–100.0)
Platelets: 74 10*3/uL — ABNORMAL LOW (ref 150–400)
RBC: 3.81 MIL/uL — ABNORMAL LOW (ref 4.22–5.81)
RDW: 14.5 % (ref 11.5–15.5)
WBC: 7.5 10*3/uL (ref 4.0–10.5)

## 2016-12-12 LAB — HAPTOGLOBIN: Haptoglobin: <10 mg/dL — ABNORMAL LOW (ref 34–200)

## 2016-12-12 LAB — RENAL FUNCTION PANEL
ALBUMIN: 2.8 g/dL — AB (ref 3.5–5.0)
ANION GAP: 13 (ref 5–15)
BUN: 102 mg/dL — ABNORMAL HIGH (ref 6–20)
CO2: 20 mmol/L — ABNORMAL LOW (ref 22–32)
Calcium: 8.5 mg/dL — ABNORMAL LOW (ref 8.9–10.3)
Chloride: 99 mmol/L — ABNORMAL LOW (ref 101–111)
Creatinine, Ser: 9.78 mg/dL — ABNORMAL HIGH (ref 0.61–1.24)
GFR, EST AFRICAN AMERICAN: 6 mL/min — AB (ref >60)
GFR, EST NON AFRICAN AMERICAN: 5 mL/min — AB (ref >60)
Glucose, Bld: 112 mg/dL — ABNORMAL HIGH (ref 65–99)
PHOSPHORUS: 4.4 mg/dL (ref 2.5–4.6)
POTASSIUM: 3.6 mmol/L (ref 3.5–5.1)
Sodium: 132 mmol/L — ABNORMAL LOW (ref 135–145)

## 2016-12-12 LAB — URINE CULTURE: Culture: NO GROWTH

## 2016-12-12 LAB — C3 COMPLEMENT: C3 COMPLEMENT: 86 mg/dL (ref 82–167)

## 2016-12-12 LAB — HIV ANTIBODY (ROUTINE TESTING W REFLEX)
HIV SCREEN 4TH GENERATION: NONREACTIVE
HIV Screen 4th Generation wRfx: NONREACTIVE

## 2016-12-12 LAB — HEPATITIS B SURFACE ANTIGEN: HEP B S AG: NEGATIVE

## 2016-12-12 LAB — C4 COMPLEMENT: COMPLEMENT C4, BODY FLUID: 21 mg/dL (ref 14–44)

## 2016-12-12 LAB — GLOMERULAR BASEMENT MEMBRANE ANTIBODIES: GBM Ab: 3 units (ref 0–20)

## 2016-12-12 LAB — MPO/PR-3 (ANCA) ANTIBODIES
ANCA Proteinase 3: <3.5 U/mL (ref 0.0–3.5)
Myeloperoxidase Abs: <9 U/mL (ref 0.0–9.0)

## 2016-12-12 LAB — COMMENT2 - HEP PANEL

## 2016-12-12 LAB — ANCA TITERS
Atypical P-ANCA titer: <1:20 {titer}
C-ANCA: <1:20 {titer}

## 2016-12-12 LAB — HEPATITIS C ANTIBODY: HCV Ab: >11 s/co ratio — ABNORMAL HIGH (ref 0.0–0.9)

## 2016-12-12 LAB — ADAMTS13 ACTIVITY: ADAMTS 13 ACTIVITY: 62.7 % — AB (ref >66.8)

## 2016-12-12 LAB — ECHOCARDIOGRAM COMPLETE
Height: 75 in
Weight: 2825.42 oz

## 2016-12-12 LAB — ANTINUCLEAR ANTIBODIES, IFA: ANA Ab, IFA: NEGATIVE

## 2016-12-12 LAB — ADAMTS13 ACTIVITY REFLEX

## 2016-12-12 LAB — HEPATITIS C ANTIBODY (REFLEX)

## 2016-12-12 LAB — HEPATITIS B SURFACE ANTIBODY,QUALITATIVE: HEP B S AB: NONREACTIVE

## 2016-12-12 LAB — UREA NITROGEN, URINE: UREA NITROGEN UR: 600 mg/dL

## 2016-12-12 MED ORDER — HEPARIN SODIUM (PORCINE) 1000 UNIT/ML IJ SOLN
2.4000 mL | Freq: Once | INTRAMUSCULAR | Status: DC
  Filled 2016-12-12: qty 2.4

## 2016-12-12 MED ORDER — SODIUM CHLORIDE 0.9 % IV SOLN
4.0000 g | Freq: Once | INTRAVENOUS | Status: DC
  Administered 2016-12-13: 4 g via INTRAVENOUS
  Filled 2016-12-12: qty 40

## 2016-12-12 MED ORDER — CALCIUM CARBONATE ANTACID 500 MG PO CHEW: 2.0000 | CHEWABLE_TABLET | ORAL | Status: DC

## 2016-12-12 MED ORDER — ACETAMINOPHEN 325 MG PO TABS: 650.0000 mg | ORAL_TABLET | ORAL | Status: DC | PRN

## 2016-12-12 MED ORDER — POLYETHYLENE GLYCOL 3350 17 G PO PACK
17.0000 g | PACK | Freq: Every day | ORAL | Status: DC
  Administered 2016-12-12 – 2016-12-14 (×3): 17 g via ORAL
  Filled 2016-12-12 (×3): qty 1

## 2016-12-12 MED ORDER — SENNOSIDES-DOCUSATE SODIUM 8.6-50 MG PO TABS
1.0000 | ORAL_TABLET | Freq: Two times a day (BID) | ORAL | Status: DC
  Administered 2016-12-12 – 2016-12-14 (×3): 1 via ORAL
  Filled 2016-12-12 (×3): qty 1

## 2016-12-12 MED ORDER — ACD FORMULA A 0.73-2.45-2.2 GM/100ML VI SOLN
500.0000 mL | Status: DC
  Administered 2016-12-13 (×2): 500 mL via INTRAVENOUS
  Filled 2016-12-12: qty 500

## 2016-12-12 MED ORDER — ASPIRIN EC 81 MG PO TBEC: 81.0000 mg | DELAYED_RELEASE_TABLET | Freq: Every day | ORAL | Status: DC

## 2016-12-12 MED ORDER — DIPHENHYDRAMINE HCL 25 MG PO CAPS: 25.0000 mg | ORAL_CAPSULE | Freq: Four times a day (QID) | ORAL | Status: DC | PRN

## 2016-12-12 MED ORDER — PREDNISONE 50 MG PO TABS
60.0000 mg | ORAL_TABLET | Freq: Every day | ORAL | Status: DC
  Administered 2016-12-12 – 2016-12-14 (×3): 60 mg via ORAL
  Filled 2016-12-12 (×3): qty 1

## 2016-12-12 MED ORDER — ANTICOAGULANT SODIUM CITRATE 4% (200MG/5ML) IV SOLN
5.0000 mL | Freq: Once | Status: DC
  Filled 2016-12-12 (×2): qty 250

## 2016-12-12 MED ORDER — ISOSORBIDE MONONITRATE ER 60 MG PO TB24
60.0000 mg | ORAL_TABLET | Freq: Every day | ORAL | Status: DC
Start: 2016-12-12 — End: 2016-12-13
  Administered 2016-12-12 – 2016-12-13 (×2): 60 mg via ORAL
  Filled 2016-12-12 (×2): qty 1

## 2016-12-12 MED ORDER — SODIUM CHLORIDE 0.9 % IV SOLN: Freq: Once | INTRAVENOUS | Status: DC

## 2016-12-12 MED ORDER — SODIUM CHLORIDE 0.9% FLUSH
10.0000 mL | INTRAVENOUS | Status: DC | PRN
  Administered 2016-12-12: 30 mL
  Filled 2016-12-12: qty 40

## 2016-12-12 NOTE — Progress Notes (Signed)
Patient ID: Delando Satter, male   DOB: 1961/08/12, 56 y.o.   MRN: [id number] S: No complaints O:BP (!) 162/91   Pulse 70   Temp 97.6 F (36.4 C) (Oral)   Resp 17   Ht 6\' 3"  (1.905 m)   Wt 80.1 kg (176 lb 9.4 oz)   SpO2 100%   BMI 22.07 kg/m   Intake/Output Summary (Last 24 hours) at 12/12/16 1011 Last data filed at 12/12/16 0436  Gross per 24 hour  Intake            414.9 ml  Output              650 ml  Net           -235.1 ml   Intake/Output: I/O last 3 completed shifts: In: 414.9 [I.V.:414.9] Out: 1607 [Urine:1050]  Intake/Output this shift:  No intake/output data recorded. Weight change: 1.31 kg (2 lb 14.2 oz) Gen:WD WN in NAD CVS:no rub Resp:occ rhonchi PXT:GGYIRS Ext:no edema   Recent Labs Lab 12/10/16 1845 12/11/16 0705 12/12/16 0239  NA 136 134* 132*  K 3.6 3.7 3.6  CL 100* 100* 99*  CO2 20* 22 20*  GLUCOSE 147* 119* 112*  BUN 93* 97* 102*  CREATININE 8.67* 9.12* 9.78*  ALBUMIN 3.5 3.4* 2.8*  CALCIUM 9.1 9.2 8.5*  PHOS 4.7* 4.5 4.4  AST 41  --   --   ALT 30  --   --    Liver Function Tests:  Recent Labs Lab 12/10/16 1845 12/11/16 0705 12/12/16 0239  AST 41  --   --   ALT 30  --   --   ALKPHOS 57  --   --   BILITOT 1.8*  --   --   PROT 6.1*  --   --   ALBUMIN 3.5 3.4* 2.8*    Recent Labs Lab 12/10/16 1938  LIPASE 33   No results for input(s): AMMONIA in the last 168 hours. CBC:  Recent Labs Lab 12/10/16 1845 12/11/16 0705 12/12/16 0239  WBC 7.8 8.6 7.5  NEUTROABS 5.6  --   --   HGB 14.6 13.9 11.6*  HCT 40.9 39.2 32.4*  MCV 84.5 85.2 85.0  PLT 55* 71* 74*   Cardiac Enzymes:  Recent Labs Lab 12/10/16 1845 12/10/16 2355 12/11/16 0705  TROPONINI 0.53* 0.50* 0.46*   CBG: No results for input(s): GLUCAP in the last 168 hours.  Iron Studies: No results for input(s): IRON, TIBC, TRANSFERRIN, FERRITIN in the last 72 hours. Studies/Results: Dg Chest 2 View  Result Date: 12/11/2016 CLINICAL DATA:  Acute renal failure. No  current cardiopulmonary complaints. Smoker, history of hypertension EXAM: CHEST  2 VIEW COMPARISON:  None in PACs FINDINGS: The lungs are adequately inflated and clear. The heart and pulmonary vascularity are normal. There is calcification in the wall of the thoracic aorta. There is no pleural effusion. The mediastinum is normal in width. The bony thorax exhibits no acute abnormality. IMPRESSION: There is acute cardiopulmonary abnormality. Thoracic aortic atherosclerosis. Electronically Signed   By: David  Martinique M.D.   On: 12/11/2016 12:42   US Renal  Result Date: 12/11/2016 CLINICAL DATA:  Acute renal insufficiency. EXAM: RENAL / URINARY TRACT ULTRASOUND COMPLETE COMPARISON:  None in PACs FINDINGS: Right Kidney: Length: 10.4 cm. The renal cortical echotexture is increased and is greater than that of the adjacent liver. There is no hydronephrosis. Left Kidney: Length: 10.1 cm. The renal cortical echotexture is increased similar to that on the right. There  is a lower pole simple appearing cyst measuring 1.2 cm in greatest dimension. There is no hydronephrosis. Bladder: The urinary bladder is decompressed by a Foley catheter. IMPRESSION: Increased renal cortical echotexture consistent with medical renal disease. No hydronephrosis. The urinary bladder is decompressed by a Foley catheter. Electronically Signed   By: David  Martinique M.D.   On: 12/11/2016 13:12   . amLODipine  10 mg Oral Daily  . [START ON 12/13/2016] aspirin EC  81 mg Oral Daily  . cloNIDine  0.1 mg Oral TID  . docusate sodium  100 mg Oral BID  . feeding supplement  1 Container Oral TID BM  . folic acid  1 mg Oral Daily  . hydrALAZINE  50 mg Oral Q8H  . isosorbide mononitrate  60 mg Oral Daily  . nicotine  14 mg Transdermal Q24H  . sodium chloride flush  3 mL Intravenous Q12H    BMET    Component Value Date/Time   NA 132 (L) 12/12/2016 0239   K 3.6 12/12/2016 0239   CL 99 (L) 12/12/2016 0239   CO2 20 (L) 12/12/2016 0239   GLUCOSE  112 (H) 12/12/2016 0239   BUN 102 (H) 12/12/2016 0239   CREATININE 9.78 (H) 12/12/2016 0239   CALCIUM 8.5 (L) 12/12/2016 0239   GFRNONAA 5 (L) 12/12/2016 0239   GFRAA 6 (L) 12/12/2016 0239   CBC    Component Value Date/Time   WBC 7.5 12/12/2016 0239   RBC 3.81 (L) 12/12/2016 0239   HGB 11.6 (L) 12/12/2016 0239   HCT 32.4 (L) 12/12/2016 0239   PLT 74 (L) 12/12/2016 0239   MCV 85.0 12/12/2016 0239   MCH 30.4 12/12/2016 0239   MCHC 35.8 12/12/2016 0239   RDW 14.5 12/12/2016 0239   LYMPHSABS 1.2 12/10/2016 1845   MONOABS 1.1 (H) 12/10/2016 1845   EOSABS 0.0 12/10/2016 1845   BASOSABS 0.0 12/10/2016 1845   Per Dr. Adin Hector consult on 12/11/16:  66M with severe renal failure (chronicity unknown -- presumably acute though), TCP, malignant HTN.  Worry is for Acute RPGN esp a TMA.  TTP a possibility with renal failure and TCP.  Other GN also possible.  HTN Emergency can also be present here but pt seems to have been normotensive in past month but not check in past week or two.   Per Dr. Julien Nordmann, there is concern for TTP  Assessment/Plan:  1. AKI- in setting of malignant hypertension, thrombocytopenia, concern for TTP vs. Acute RPGN/TMA.   1. Consulted PCCM for HD catheter placement and start TPE for presumed TTP +/- HD as needed.   2. Normal complements, negative Hep B, C, HIV, anti GBM, however ANA/ANCA/ADAMTS13 activity are pending 2. Thrombocytopenia with suspicion for TTP/HUS:  Fever, ARF, thrombocytopenia, hemolytic anemia (low haptoglobin, +schistocytes), and AMS  1. Discussed with Heme and PCCM.  As above 3. Malignant HTN- improving with medications  Donetta Potts, MD [facility] [phone]

## 2016-12-12 NOTE — Consult Note (Signed)
Admission H&P    Chief Complaint: Transient right-sided numbness and weakness.  HPI: Frank Fuller is an 56 y.o. male with a history of hypertension, admitted on 12/10/2016 with nausea and vomiting for 3 days as well as pickups. He was noted to be thrombocytopenic as well as having signs of acute kidney injury. He is being evaluated by hematology and also nephrology. Asthma pheresis and possible hemodialysis or planned. Neurology consultation was obtained because patient developed acute onset of numbness and weakness involving his right side at around 6 PM today. He noted inability to make a tight fist with his right hand, and had difficulty with speech output. This is subsequently resolved. CT scan of his head showed no acute abnormality. MRI findings consistent with small acute left frontal ischemic infarction. NIH stroke score at the time of this evaluation was 0.  LSN: 6:00 PM on 12/12/2016 tPA Given: No: Thrombocytopenic; minimal deficits mRankin:  Past Medical History:  Diagnosis Date  . Hypertension     History reviewed. No pertinent surgical history.  Family History  Problem Relation Age of Onset  . Hypertension Mother    Social History:  reports that he has been smoking Cigarettes.  He does not have any smokeless tobacco history on file. His alcohol and drug histories are not on file.  Allergies: No Known Allergies  Medications Prior to Admission  Medication Sig Dispense Refill  . Acetaminophen (TYLENOL PO) Take 2 tablets by mouth every 6 (six) hours as needed (pain/headache).    Marland Kitchen amLODipine (NORVASC) 10 MG tablet Take 10 mg by mouth daily.      ROS: History obtained from the patient  General ROS: negative for - chills, fatigue, fever, night sweats, weight gain or weight loss Psychological ROS: negative for - behavioral disorder, hallucinations, memory difficulties, mood swings or suicidal ideation Ophthalmic ROS: negative for - blurry vision, double vision, eye pain or  loss of vision ENT ROS: negative for - epistaxis, nasal discharge, oral lesions, sore throat, tinnitus or vertigo Allergy and Immunology ROS: negative for - hives or itchy/watery eyes Hematological and Lymphatic ROS: negative for - bleeding problems, bruising or swollen lymph nodes Endocrine ROS: negative for - galactorrhea, hair pattern changes, polydipsia/polyuria or temperature intolerance Respiratory ROS: negative for - cough, hemoptysis, shortness of breath or wheezing Cardiovascular ROS: negative for - chest pain, dyspnea on exertion, edema or irregular heartbeat Gastrointestinal ROS: As noted in present illness  Genito-Urinary ROS: As noted in present illness  Musculoskeletal ROS: negative for - joint swelling or muscular weakness Neurological ROS: as noted in HPI Dermatological ROS: negative for rash and skin lesion changes  Physical Examination: Blood pressure (!) 152/101, pulse 76, temperature 97.9 F (36.6 C), temperature source Oral, resp. rate 19, height '6\' 3"'  (1.905 m), weight 80.1 kg (176 lb 9.4 oz), SpO2 100 %.  HEENT-  Normocephalic, no lesions, without obvious abnormality.  Normal external eye and conjunctiva.  Normal TM's bilaterally.  Normal auditory canals and external ears. Normal external nose, mucus membranes and septum.  Normal pharynx. Neck supple with no masses, nodes, nodules or enlargement. Cardiovascular - regular rate and rhythm, S1, S2 normal, no murmur, click, rub or gallop Lungs - chest clear, no wheezing, rales, normal symmetric air entry Abdomen - soft, non-tender; bowel sounds normal; no masses,  no organomegaly Extremities - no joint deformities, effusion, or inflammation  Neurologic Examination: Mental Status: Alert, oriented, thought content appropriate.  Speech fluent without evidence of aphasia. Able to follow commands without difficulty. Cranial Nerves:  II-Visual fields were normal. III/IV/VI-Pupils were equal and reacted normally to light.  Extraocular movements were full and conjugate.    V/VII-no facial numbness and no facial weakness. VIII-normal. X-normal speech. Motor: 5/5 bilaterally with normal tone and bulk Sensory: Normal throughout. Deep Tendon Reflexes: 1+ and symmetric in upper extremities and absent in lower extremities. Plantars: Flexor bilaterally Cerebellar: Normal finger-to-nose testing. Carotid auscultation: Normal  Results for orders placed or performed during the hospital encounter of 12/10/16 (from the past 48 hour(s))  Troponin I     Status: Abnormal   Collection Time: 12/10/16 11:55 PM  Result Value Ref Range   Troponin I 0.50 (HH) <0.03 ng/mL    Comment: CRITICAL VALUE NOTED.  VALUE IS CONSISTENT WITH PREVIOUSLY REPORTED AND CALLED VALUE.  Urine culture     Status: None   Collection Time: 12/11/16  5:45 AM  Result Value Ref Range   Specimen Description URINE, CATHETERIZED    Special Requests NONE    Culture NO GROWTH    Report Status 12/12/2016 FINAL   Urinalysis, Complete w Microscopic     Status: Abnormal   Collection Time: 12/11/16  5:45 AM  Result Value Ref Range   Color, Urine YELLOW YELLOW   APPearance HAZY (A) CLEAR   Specific Gravity, Urine 1.012 1.005 - 1.030   pH 5.0 5.0 - 8.0   Glucose, UA NEGATIVE NEGATIVE mg/dL   Hgb urine dipstick LARGE (A) NEGATIVE   Bilirubin Urine NEGATIVE NEGATIVE   Ketones, ur NEGATIVE NEGATIVE mg/dL   Protein, ur >=300 (A) NEGATIVE mg/dL   Nitrite NEGATIVE NEGATIVE   Leukocytes, UA TRACE (A) NEGATIVE   RBC / HPF TOO NUMEROUS TO COUNT 0 - 5 RBC/hpf   WBC, UA 6-30 0 - 5 WBC/hpf   Bacteria, UA RARE (A) NONE SEEN   Squamous Epithelial / LPF 0-5 (A) NONE SEEN  Urine rapid drug screen (hosp performed)     Status: Abnormal   Collection Time: 12/11/16  5:45 AM  Result Value Ref Range   Opiates NONE DETECTED NONE DETECTED   Cocaine NONE DETECTED NONE DETECTED   Benzodiazepines NONE DETECTED NONE DETECTED   Amphetamines NONE DETECTED NONE DETECTED    Tetrahydrocannabinol POSITIVE (A) NONE DETECTED   Barbiturates NONE DETECTED NONE DETECTED    Comment:        DRUG SCREEN FOR MEDICAL PURPOSES ONLY.  IF CONFIRMATION IS NEEDED FOR ANY PURPOSE, NOTIFY LAB WITHIN 5 DAYS.        LOWEST DETECTABLE LIMITS FOR URINE DRUG SCREEN Drug Class       Cutoff (ng/mL) Amphetamine      1000 Barbiturate      200 Benzodiazepine   366 Tricyclics       294 Opiates          300 Cocaine          300 THC              50   Osmolality, urine     Status: None   Collection Time: 12/11/16  5:45 AM  Result Value Ref Range   Osmolality, Ur 348 300 - 900 mOsm/kg  Creatinine, urine, random     Status: None   Collection Time: 12/11/16  5:45 AM  Result Value Ref Range   Creatinine, Urine 107.00 mg/dL  Sodium, urine, random     Status: None   Collection Time: 12/11/16  5:45 AM  Result Value Ref Range   Sodium, Ur 38 mmol/L  Urea nitrogen, urine  Status: None   Collection Time: 12/11/16  5:45 AM  Result Value Ref Range   Urea Nitrogen, Ur 600 Not Estab. mg/dL    Comment: (NOTE) Performed At: [facility] [place], [place] [id number] Lindon Romp MD OI:[id number]   Protein / creatinine ratio, urine     Status: Abnormal   Collection Time: 12/11/16  5:45 AM  Result Value Ref Range   Creatinine, Urine 106.59 mg/dL   Total Protein, Urine 330 mg/dL    Comment: RESULTS CONFIRMED BY MANUAL DILUTION NO NORMAL RANGE ESTABLISHED FOR THIS TEST    Protein Creatinine Ratio 3.10 (H) 0.00 - 0.15 mg/mg[Cre]  Troponin I     Status: Abnormal   Collection Time: 12/11/16  7:05 AM  Result Value Ref Range   Troponin I 0.46 (HH) <0.03 ng/mL    Comment: CRITICAL VALUE NOTED.  VALUE IS CONSISTENT WITH PREVIOUSLY REPORTED AND CALLED VALUE.  CBC     Status: Abnormal   Collection Time: 12/11/16  7:05 AM  Result Value Ref Range   WBC 8.6 4.0 - 10.5 K/uL   RBC 4.60 4.22 - 5.81 MIL/uL   Hemoglobin 13.9 13.0 - 17.0 g/dL   HCT 39.2 39.0 - 52.0 %    MCV 85.2 78.0 - 100.0 fL   MCH 30.2 26.0 - 34.0 pg   MCHC 35.5 30.0 - 36.0 g/dL   RDW 14.5 11.5 - 15.5 %   Platelets 71 (L) 150 - 400 K/uL    Comment: CONSISTENT WITH PREVIOUS RESULT  Renal function panel     Status: Abnormal   Collection Time: 12/11/16  7:05 AM  Result Value Ref Range   Sodium 134 (L) 135 - 145 mmol/L   Potassium 3.7 3.5 - 5.1 mmol/L    Comment: SPECIMEN HEMOLYZED. HEMOLYSIS MAY AFFECT INTEGRITY OF RESULTS.   Chloride 100 (L) 101 - 111 mmol/L   CO2 22 22 - 32 mmol/L   Glucose, Bld 119 (H) 65 - 99 mg/dL   BUN 97 (H) 6 - 20 mg/dL   Creatinine, Ser 9.12 (H) 0.61 - 1.24 mg/dL   Calcium 9.2 8.9 - 10.3 mg/dL   Phosphorus 4.5 2.5 - 4.6 mg/dL   Albumin 3.4 (L) 3.5 - 5.0 g/dL   GFR calc non Af Amer 6 (L) >60 mL/min   GFR calc Af Amer 7 (L) >60 mL/min    Comment: (NOTE) The eGFR has been calculated using the CKD EPI equation. This calculation has not been validated in all clinical situations. eGFR's persistently <60 mL/min signify possible Chronic Kidney Disease.    Anion gap 12 5 - 15  Type and screen [place]     Status: None   Collection Time: 12/11/16  2:00 PM  Result Value Ref Range   ABO/RH(D) O POS    Antibody Screen NEG    Sample Expiration 12/14/2016   ABO/Rh     Status: None   Collection Time: 12/11/16  2:00 PM  Result Value Ref Range   ABO/RH(D) O POS   ADAMTS13 Activity     Status: Abnormal   Collection Time: 12/11/16  2:07 PM  Result Value Ref Range   Adamts 13 Activity 62.7 (L) >66.8 %    Comment: (NOTE) This test was developed and its performance characteristics determined by [facility]. It has not been cleared or approved by the Food and Drug Administration. Performed At: [facility] [place], [place] [id number] Lindon Romp MD QH:[id number]   HIV  antibody     Status: None   Collection Time: 12/11/16  2:07 PM  Result Value Ref Range   HIV Screen 4th Generation wRfx Non Reactive Non Reactive     Comment: (NOTE) Performed At: [facility] [address] [place], [place] [id number] Lindon Romp MD [id number]   Antinuclear Antibodies, IFA     Status: None   Collection Time: 12/11/16  2:07 PM  Result Value Ref Range   ANA Ab, IFA Negative     Comment: (NOTE)                                     Negative   <1:80                                     Borderline  1:80                                     Positive   >1:80 Performed At: [facility] [place], [place] [id number] Lindon Romp MD OF:[id number]   Hepatitis B surface antigen     Status: None   Collection Time: 12/11/16  2:07 PM  Result Value Ref Range   Hepatitis B Surface Ag Negative Negative    Comment: (NOTE) Performed At: [facility] [place], [place] [id number] Lindon Romp MD [id number]   Hepatitis B surface antibody     Status: None   Collection Time: 12/11/16  2:07 PM  Result Value Ref Range   Hep B S Ab Non Reactive     Comment: (NOTE)              Non Reactive: Inconsistent with immunity,                            less than 10 mIU/mL              Reactive:     Consistent with immunity,                            greater than 9.9 mIU/mL Performed At: [facility] [place], [place] [id number] Lindon Romp MD OP:[id number]   Hepatitis C antibody     Status: Abnormal   Collection Time: 12/11/16  2:07 PM  Result Value Ref Range   HCV Ab >11.0 (H) 0.0 - 0.9 s/co ratio    Comment: (NOTE)                                  Negative:     < 0.8                             Indeterminate: 0.8 - 0.9                                  Positive:     > 0.9 The CDC recommends that a positive HCV antibody result be followed up with a  HCV Nucleic Acid Amplification test (542706). Performed At: [facility] [place], [place] [id number] Lindon Romp MD VV:[id number]   C3 complement     Status:  None   Collection Time: 12/11/16  2:07 PM  Result Value Ref Range   C3 Complement 86 82 - 167 mg/dL    Comment: (NOTE) Performed At: [facility] [address] [place], [place] [id number] Lindon Romp MD VO:[id number]   C4 complement     Status: None   Collection Time: 12/11/16  2:07 PM  Result Value Ref Range   Complement C4, Body Fluid 21 14 - 44 mg/dL    Comment: (NOTE) Performed At: [facility] [address] [place], [place] [id number] Lindon Romp MD [id number]   Haptoglobin     Status: Abnormal   Collection Time: 12/11/16  2:07 PM  Result Value Ref Range   Haptoglobin <10 (L) 34 - 200 mg/dL    Comment: (NOTE) Performed At: [facility] [place], [place] [id number] Lindon Romp MD [id number]   ANCA Titers     Status: None   Collection Time: 12/11/16  2:07 PM  Result Value Ref Range   C-ANCA <1:20 Neg:<1:20 titer   P-ANCA <1:20 Neg:<1:20 titer    Comment: (NOTE) The presence of positive fluorescence exhibiting P-ANCA or C-ANCA patterns alone is not specific for the diagnosis of Wegener's Granulomatosis (WG) or microscopic polyangiitis. Decisions about treatment should not be based solely on ANCA IFA results.  The International ANCA Group Consensus recommends follow up testing of positive sera with both PR-3 and MPO-ANCA enzyme immunoassays. As many as 5% serum samples are positive only by EIA. Ref. AM J Clin Pathol 1999;111:507-513.    Atypical P-ANCA titer <1:20 Neg:<1:20 titer    Comment: (NOTE) The atypical pANCA pattern has been observed in a significant percentage of patients with ulcerative colitis, primary sclerosing cholangitis and autoimmune hepatitis. Performed At: [facility] [place], [place] [id number] Lindon Romp MD KD:[id number]   Mpo/pr-3 (anca) antibodies     Status: None   Collection Time: 12/11/16  2:07 PM  Result Value Ref Range   Myeloperoxidase Abs  <9.0 0.0 - 9.0 U/mL   ANCA Proteinase 3 <3.5 0.0 - 3.5 U/mL    Comment: (NOTE) Performed At: [facility] [place], [place] [id number] Lindon Romp MD NL:[id number]   Glomerular basement membrane antibodies     Status: None   Collection Time: 12/11/16  2:07 PM  Result Value Ref Range   GBM Ab 3 0 - 20 units    Comment: (NOTE)                   Negative                   0 - 20                   Weak Positive             21 - 30                   Moderate to Strong Positive   >30 Performed At: [facility] [address] [place], [place] [id number] Lindon Romp MD [id number]   ADAMTS13 Activity reflex     Status: None   Collection Time: 12/11/16  2:07 PM  Result Value Ref Range   ADAMTS13 Activity comment Comment  Comment: (NOTE) Severe deficiency of ADAMTS13 (less than 10% activity) is a relatively specific finding in patients with a clinical diagnosis of either hereditary or acquired thrombotic thrombocytopenic purpura (TTP). Normal to moderately reduced ADAMTS13 activity results do not exclude a diagnosis of TTP. Conditions that could have ADAMTS13 activity greater than 10% include hemolytic uremic syndrome (HUS), atypical hemolytic uremic syndrome (aHUS), and other thrombotic microangiopathies associated with hematopoietic stem cell and solid organ transplantation, liver disease, DIC, sepsis, pregnancy, or effects of certain medications (eg, clopidogrel, cyclosporine, mitomycin C, quinine). Performed At: [facility] [place], [place] [id number] Lindon Romp MD WY:[id number]   Save smear     Status: None   Collection Time: 12/11/16  3:22 PM  Result Value Ref Range   Smear Review SMEAR STAINED AND AVAILABLE FOR REVIEW   Fibrinogen     Status: None   Collection Time: 12/11/16  5:00 PM  Result Value Ref Range   Fibrinogen 274 210 - 475 mg/dL  CBC     Status: Abnormal   Collection Time: 12/12/16  2:39  AM  Result Value Ref Range   WBC 7.5 4.0 - 10.5 K/uL   RBC 3.81 (L) 4.22 - 5.81 MIL/uL   Hemoglobin 11.6 (L) 13.0 - 17.0 g/dL   HCT 32.4 (L) 39.0 - 52.0 %   MCV 85.0 78.0 - 100.0 fL   MCH 30.4 26.0 - 34.0 pg   MCHC 35.8 30.0 - 36.0 g/dL   RDW 14.5 11.5 - 15.5 %   Platelets 74 (L) 150 - 400 K/uL    Comment: CONSISTENT WITH PREVIOUS RESULT  Renal function panel     Status: Abnormal   Collection Time: 12/12/16  2:39 AM  Result Value Ref Range   Sodium 132 (L) 135 - 145 mmol/L   Potassium 3.6 3.5 - 5.1 mmol/L   Chloride 99 (L) 101 - 111 mmol/L   CO2 20 (L) 22 - 32 mmol/L   Glucose, Bld 112 (H) 65 - 99 mg/dL   BUN 102 (H) 6 - 20 mg/dL   Creatinine, Ser 9.78 (H) 0.61 - 1.24 mg/dL   Calcium 8.5 (L) 8.9 - 10.3 mg/dL   Phosphorus 4.4 2.5 - 4.6 mg/dL   Albumin 2.8 (L) 3.5 - 5.0 g/dL   GFR calc non Af Amer 5 (L) >60 mL/min   GFR calc Af Amer 6 (L) >60 mL/min    Comment: (NOTE) The eGFR has been calculated using the CKD EPI equation. This calculation has not been validated in all clinical situations. eGFR's persistently <60 mL/min signify possible Chronic Kidney Disease.    Anion gap 13 5 - 15   Dg Chest 2 View  Result Date: 12/11/2016 CLINICAL DATA:  Acute renal failure. No current cardiopulmonary complaints. Smoker, history of hypertension EXAM: CHEST  2 VIEW COMPARISON:  None in PACs FINDINGS: The lungs are adequately inflated and clear. The heart and pulmonary vascularity are normal. There is calcification in the wall of the thoracic aorta. There is no pleural effusion. The mediastinum is normal in width. The bony thorax exhibits no acute abnormality. IMPRESSION: There is acute cardiopulmonary abnormality. Thoracic aortic atherosclerosis. Electronically Signed   By: David  Martinique M.D.   On: 12/11/2016 12:42   Dg Abd 1 View  Result Date: 12/12/2016 CLINICAL DATA:  56 y/o  M; evaluation for metal prior to MRI. EXAM: ABDOMEN - 1 VIEW COMPARISON:  None. FINDINGS: Punctate metallic  fragments project over the left hemi sacrum and there is a  20 mm fragment projecting over the left ilium. Normal bowel gas pattern. Lower lumbar spine degenerative changes. No acute osseous abnormality identified. IMPRESSION: Metal fragments projecting over the pelvis. Electronically Signed   By: Kristine Garbe M.D.   On: 12/12/2016 20:17   Ct Head Wo Contrast  Result Date: 12/12/2016 CLINICAL DATA:  Acute encephalopathy.  Hypertension EXAM: CT HEAD WITHOUT CONTRAST TECHNIQUE: Contiguous axial images were obtained from the base of the skull through the vertex without intravenous contrast. COMPARISON:  None. FINDINGS: Brain: Ventricle size and cerebral volume normal. Extensive white matter hypodensity diffusely. Brainstem and cerebellum normal. Negative for hemorrhage or mass. No shift of the midline structures. Vascular: No hyperdense vessel or unexpected calcification. Skull: Negative Sinuses/Orbits: Negative Other: None IMPRESSION: Extensive white matter disease bilaterally. No prior studies available however this pattern is most consistent with advanced chronic microvascular ischemia. No acute infarct or hemorrhage. Electronically Signed   By: Franchot Gallo M.D.   On: 12/12/2016 19:47   Mr Brain Wo Contrast  Result Date: 12/12/2016 CLINICAL DATA:  Headache and hypertension.  Right body numbness EXAM: MRI HEAD WITHOUT CONTRAST TECHNIQUE: Multiplanar, multiecho pulse sequences of the brain and surrounding structures were obtained without intravenous contrast. COMPARISON:  CT head 12/12/2016 FINDINGS: Brain: Small linear area of restricted diffusion in the left frontal white matter most consistent with acute infarct. Small area of restricted diffusion in the right parietal white matter also consistent with acute infarct. Extensive hyperintensity throughout the periventricular and deep white matter bilaterally. Hyperintensity in the pons bilaterally extending into the cerebellum bilaterally. These  findings are most likely due to chronic ischemia. Negative for hemorrhage or mass. Ventricle size normal. No shift of the midline structures. Vascular: Normal arterial flow void Skull and upper cervical spine: Negative Sinuses/Orbits: Negative Other: None IMPRESSION: Small areas of restricted diffusion in the left frontal lobe and right parietal lobe most consistent with acute infarction. Extensive white matter changes bilaterally most compatible chronic microvascular ischemia. Hyperintensity in the pons and cerebellum most likely due to chronic ischemia. Given history severe hypertension, PRES also a consideration. Electronically Signed   By: Franchot Gallo M.D.   On: 12/12/2016 21:10   US Renal  Result Date: 12/11/2016 CLINICAL DATA:  Acute renal insufficiency. EXAM: RENAL / URINARY TRACT ULTRASOUND COMPLETE COMPARISON:  None in PACs FINDINGS: Right Kidney: Length: 10.4 cm. The renal cortical echotexture is increased and is greater than that of the adjacent liver. There is no hydronephrosis. Left Kidney: Length: 10.1 cm. The renal cortical echotexture is increased similar to that on the right. There is a lower pole simple appearing cyst measuring 1.2 cm in greatest dimension. There is no hydronephrosis. Bladder: The urinary bladder is decompressed by a Foley catheter. IMPRESSION: Increased renal cortical echotexture consistent with medical renal disease. No hydronephrosis. The urinary bladder is decompressed by a Foley catheter. Electronically Signed   By: David  Martinique M.D.   On: 12/11/2016 13:12   Dg Chest Port 1 View  Result Date: 12/12/2016 CLINICAL DATA:  Central line placement EXAM: PORTABLE CHEST 1 VIEW COMPARISON:  12/11/2016 FINDINGS: New right internal jugular central line tip in the SVC at the azygos level, 6 cm above the right atrium. No pneumothorax. Lungs remain clear. IMPRESSION: Right internal jugular central line tip in the SVC at the azygos level. Electronically Signed   By: Nelson Chimes  M.D.   On: 12/12/2016 15:27    Assessment: 56 y.o. male with a history of hypertension new-onset acute kidney injury and thrombocytopenia,  with findings consistent with acute left frontal ischemic stroke.  Stroke Risk Factors - hypertension and smoking  Plan: 1. HgbA1c, fasting lipid panel 2. MRI, MRA  of the brain without contrast 3. PT consult, OT consult, Speech consult 4. Echocardiogram 5. Carotid dopplers 6. Prophylactic therapy-None (thrombocytopenia) 7. Risk factor modification 8. Telemetry monitoring  C.R. Nicole Kindred, MD [facility] [phone]  12/12/2016, 9:47 PM

## 2016-12-12 NOTE — Progress Notes (Signed)
CHART NOTE The results from CBC and Haptoglobin today increased my suspicion for underlying TTP. I will ask Nephrology to consider the patient for plasma exchange.

## 2016-12-12 NOTE — Progress Notes (Signed)
Pt systolic BP elevated to 437C. Pt complaining of headache. Pt also states that R side of body is feeling numb. When asked if it felt like pins and needles he said "no, dizzy" and struggle to find words and stated "I feel like I've been drugged". Pt face still symmetrical, grips still equal however weaker than prior exams. PRN hydralizine given and PRN tylenol given. BP down to 167/97. Pt complains of being sleepy. MD paged and ordered CT scan and q4 Neuro checks. Will continue to monitor.

## 2016-12-12 NOTE — Procedures (Signed)
Hemodialysis Insertion Procedure Note Frank Fuller [id number] Mar 01, 1961  Procedure: Insertion of Hemodialysis Catheter Type: 3 port  Indications: Hemodialysis   Procedure Details Consent: Risks of procedure as well as the alternatives and risks of each were explained to the (patient/caregiver).  Consent for procedure obtained. Time Out: Verified patient identification, verified procedure, site/side was marked, verified correct patient position, special equipment/implants available, medications/allergies/relevent history reviewed, required imaging and test results available.  Performed  Maximum sterile technique was used including antiseptics, cap, gloves, gown, hand hygiene, mask and sheet. Skin prep: Chlorhexidine; local anesthetic administered A antimicrobial bonded/coated triple lumen catheter was placed in the right internal jugular vein using the Seldinger technique. Ultrasound guidance used.Yes.   Catheter placed to 16 cm. Blood aspirated via all 3 ports and then flushed x 3. Line sutured x 2 and dressing applied.  Evaluation Blood flow good Complications: No apparent complications Patient did tolerate procedure well. Chest X-ray ordered to verify placement.  CXR: pending.  Georgann Housekeeper, AGACNP-BC [facility] Pulmonology/Critical Care Pager [phone] or [phone]  12/12/2016 3:07 PM

## 2016-12-12 NOTE — Progress Notes (Addendum)
[facility] Progress Note  Patient: Frank Fuller INO:[id number]   PCP: No primary care provider on file. DOB: November 29, 1960   DOA: 12/10/2016   DOS: 12/12/2016   Date of Service: the patient was seen and examined on 12/12/2016   Subjective: Somewhat more lethargic today. Denies any nausea or vomiting no acute complaints.  Brief hospital course: Pt. with PMH of hypertension; admitted on 12/10/2016, with complaint of nausea vomiting, hiccups, generalized malaise, was found to have acute kidney injury and thrombocytopenia. Nephrology consulted who in turn has consulted hematology at present. Low haptoglobin, hematology recommended plasmapheresis, CCM with insert HD catheter. Currently further plan is plasmapheresis and monitor for renal recovery  Assessment and Plan: 1. AKI (acute kidney injury) ([place])  Uremia with hiccups and nausea. 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 Found to have a creatinine of 8.8 with BUN off 85. Also has hypertensive emergency. CT abdomen shows no evidence of renal obstruction. This is probably combination of hypertensive nephropathy and prerenal etiology with poor oral intake. With low haptoglobin this can also be TMA/TTP. Not taking any ACE inhibitor or NSAIDs Had a Foley catheter which was inserted in the other facility which was removed on 12/12/2016. Avoiding aggressive IV hydration in the setting of hypertensive emergency. Nephrology consulted. Fena 2.4, appears intrinsic. Hematology is concerned that the patient has having TMA and recommended plasmapheresis, CCM will be putting in HD catheter and the patient will be starting on the plasmapheresis today. Monitor for renal recovery. Daily weight and strict ins and outs. Avoid nephrotoxic medications.  2.  malignant hypertension In the setting of not taking his blood pressure medication for last 3 weeks. Patient has received enalaprilat,  hydralazine, oral Lopressor. Was started on nitroglycerin infusion I would stop nitroglycerin infusion. Increase imdur and hydralazine. Continue clonidine 0.1 mg 3 times a day, Norvasc 10 mg daily. Use when necessary IV hydralazine for acute blood pressure changes. avoiding ACE inhibitor. No evidence of volume overload or acute CHF at present.  Somewhat lethargic today. No focal deficit  3. Thrombocytopenia. Etiology currently unclear. Patient denies having any active bleeding. Patient denies any issues with his platelets in the past. Normal INR, APTT, normal fibrinogen level, hematology consulted by nephrology, but low haptoglobin concern for TMA, plasmapheresis will be initiated. Monitor for response. Avoiding pharmacological DVT prophylaxis in the setting of platelets less then 100. Using mechanical compression.  4. Elevated bilirubin. Etiology currently unclear, will monitor and recheck. No evidence of AST ALT elevation or ALP Elevation. No right upper quadrant abdominal pain as well. CT scan of the other facility unremarkable  5. Elevated troponin. Elevated NT-proBNP EKG shows T-wave inversions in lead V4 to V6 and aVL. No ST elevation. This is likely in the setting of hypertensive emergency. Also possible etiology of elevation of cardiac enzymes is acute kidney injury causing poor clearance.  Patient denies having any compressive chest pain or shortness of breath. I do not suspect that he has any ongoing active ACS or any acute CHF. Continue 80 mg aspirin, Check echocardiogram. Monitor on telemetry. May require cardiology consult if he has any abnormalities on above workup. UDS rule out cocaine.  6. Hiccups. When necessary Thorazine.  7. Nausea and vomiting. CT abdomen unremarkable. UA unremarkable. Normal lipase level, lactic acid level. When necessary Zofran, when necessary Phenergan for refractory nausea.  8. Hepatitis C Positive antibody, check viral load,  outpatient treatment  complement level stable  Bowel regimen: last BM 12/07/2016, bowel regimen  ordered Diet: renal diet DVT Prophylaxis: mechanical compression device  Advance goals of care discussion: full code  Family Communication: no family was present at bedside, at the time of interview.   Disposition:  Discharge to home. Expected discharge date: 12/20/2016, stabilization of the renal function  Consultants: nephrology, hematology Procedures: echocardiogram  Antibiotics: Anti-infectives    None     Objective: Physical Exam: Vitals:   12/12/16 0809 12/12/16 0900 12/12/16 1000 12/12/16 1100  BP: (!) 167/86 (!) 162/91 (!) 167/83 (!) 150/98  Pulse: 80 70 67 79  Resp: 18 17  13   Temp: 97.6 F (36.4 C)   97.6 F (36.4 C)  TempSrc: Oral   Oral  SpO2: 100% 100% 100% 99%  Weight:      Height:        Intake/Output Summary (Last 24 hours) at 12/12/16 1308 Last data filed at 12/12/16 1144  Gross per 24 hour  Intake            414.9 ml  Output              850 ml  Net           -435.1 ml   Filed Weights   12/10/16 1742 12/11/16 0500 12/12/16 0500  Weight: 78.8 kg (173 lb 11.2 oz) 79.3 kg (174 lb 13.2 oz) 80.1 kg (176 lb 9.4 oz)   General: Alert, Awake and Oriented to Time, Place and Person. Appear in mild distress, affect appropriate Eyes: PERRL, Conjunctiva normal ENT: Oral Mucosa clear moist. Neck: no JVD, no Abnormal Mass Or lumps Cardiovascular: S1 and S2 Present, no Murmur, Respiratory: Bilateral Air entry equal and Decreased, no use of accessory muscle, Clear to Auscultation, no Crackles, no wheezes Abdomen: Bowel Sound present, Soft and no tenderness Skin: no redness, no Rash, no induration Extremities: no Pedal edema, no calf tenderness Neurologic: Grossly no focal neuro deficit. Bilaterally Equal motor strength  Data Reviewed: CBC:  Recent Labs Lab 12/10/16 1845 12/11/16 0705 12/12/16 0239  WBC 7.8 8.6 7.5  NEUTROABS 5.6  --   --   HGB 14.6  13.9 11.6*  HCT 40.9 39.2 32.4*  MCV 84.5 85.2 85.0  PLT 55* 71* 74*   Basic Metabolic Panel:  Recent Labs Lab 12/10/16 1845 12/11/16 0705 12/12/16 0239  NA 136 134* 132*  K 3.6 3.7 3.6  CL 100* 100* 99*  CO2 20* 22 20*  GLUCOSE 147* 119* 112*  BUN 93* 97* 102*  CREATININE 8.67* 9.12* 9.78*  CALCIUM 9.1 9.2 8.5*  MG 1.9  --   --   PHOS 4.7* 4.5 4.4   Liver Function Tests:  Recent Labs Lab 12/10/16 1845 12/11/16 0705 12/12/16 0239  AST 41  --   --   ALT 30  --   --   ALKPHOS 57  --   --   BILITOT 1.8*  --   --   PROT 6.1*  --   --   ALBUMIN 3.5 3.4* 2.8*    Recent Labs Lab 12/10/16 1938  LIPASE 33   No results for input(s): AMMONIA in the last 168 hours. Coagulation Profile:  Recent Labs Lab 12/10/16 1845  INR 1.03   Cardiac Enzymes:  Recent Labs Lab 12/10/16 1845 12/10/16 2355 12/11/16 0705  TROPONINI 0.53* 0.50* 0.46*   BNP (last 3 results) No results for input(s): PROBNP in the last 8760 hours.  CBG: No results for input(s): GLUCAP in the last 168 hours.  Studies: No results found. Scheduled Meds: .  amLODipine  10 mg Oral Daily  . [START ON 12/13/2016] aspirin EC  81 mg Oral Daily  . cloNIDine  0.1 mg Oral TID  . docusate sodium  100 mg Oral BID  . feeding supplement  1 Container Oral TID BM  . folic acid  1 mg Oral Daily  . hydrALAZINE  50 mg Oral Q8H  . isosorbide mononitrate  60 mg Oral Daily  . nicotine  14 mg Transdermal Q24H  . sodium chloride flush  3 mL Intravenous Q12H   Continuous Infusions:  PRN Meds: acetaminophen **OR** acetaminophen, chlorproMAZINE (THORAZINE) IV, hydrALAZINE, ondansetron **OR** ondansetron (ZOFRAN) IV, promethazine  Time spent: 30 minutes  Author: Berle Mull, MD [facility] Pager: [phone] 12/12/2016 1:08 PM  If 7PM-7AM, please contact night-coverage at [email or web address], password [facility]

## 2016-12-13 ENCOUNTER — Encounter (HOSPITAL_COMMUNITY): Payer: Self-pay | Admitting: *Deleted

## 2016-12-13 ENCOUNTER — Encounter (HOSPITAL_COMMUNITY): Payer: Medicaid Other

## 2016-12-13 DIAGNOSIS — E871 Hypo-osmolality and hyponatremia: Secondary | ICD-10-CM

## 2016-12-13 DIAGNOSIS — I471 Supraventricular tachycardia, unspecified: Secondary | ICD-10-CM

## 2016-12-13 DIAGNOSIS — I639 Cerebral infarction, unspecified: Secondary | ICD-10-CM | POA: Diagnosis not present

## 2016-12-13 DIAGNOSIS — I6300 Cerebral infarction due to thrombosis of unspecified precerebral artery: Secondary | ICD-10-CM

## 2016-12-13 DIAGNOSIS — R112 Nausea with vomiting, unspecified: Secondary | ICD-10-CM

## 2016-12-13 LAB — RENAL FUNCTION PANEL
ANION GAP: 13 (ref 5–15)
Albumin: 2.8 g/dL — ABNORMAL LOW (ref 3.5–5.0)
BUN: 107 mg/dL — ABNORMAL HIGH (ref 6–20)
CALCIUM: 8.7 mg/dL — AB (ref 8.9–10.3)
CO2: 20 mmol/L — AB (ref 22–32)
Chloride: 95 mmol/L — ABNORMAL LOW (ref 101–111)
Creatinine, Ser: 10.36 mg/dL — ABNORMAL HIGH (ref 0.61–1.24)
GFR calc non Af Amer: 5 mL/min — ABNORMAL LOW (ref >60)
GFR, EST AFRICAN AMERICAN: 6 mL/min — AB (ref >60)
GLUCOSE: 141 mg/dL — AB (ref 65–99)
POTASSIUM: 4.1 mmol/L (ref 3.5–5.1)
Phosphorus: 5.5 mg/dL — ABNORMAL HIGH (ref 2.5–4.6)
SODIUM: 128 mmol/L — AB (ref 135–145)

## 2016-12-13 LAB — POCT I-STAT, CHEM 8
BUN: 114 mg/dL — AB (ref 6–20)
CREATININE: 11 mg/dL — AB (ref 0.61–1.24)
Calcium, Ion: 1.1 mmol/L — ABNORMAL LOW (ref 1.15–1.40)
Chloride: 95 mmol/L — ABNORMAL LOW (ref 101–111)
GLUCOSE: 135 mg/dL — AB (ref 65–99)
HCT: 34 % — ABNORMAL LOW (ref 39.0–52.0)
HEMOGLOBIN: 11.6 g/dL — AB (ref 13.0–17.0)
POTASSIUM: 4.1 mmol/L (ref 3.5–5.1)
Sodium: 129 mmol/L — ABNORMAL LOW (ref 135–145)
TCO2: 21 mmol/L (ref 0–100)

## 2016-12-13 LAB — CBC
HCT: 33.3 % — ABNORMAL LOW (ref 39.0–52.0)
HEMOGLOBIN: 11.6 g/dL — AB (ref 13.0–17.0)
MCH: 29.7 pg (ref 26.0–34.0)
MCHC: 34.8 g/dL (ref 30.0–36.0)
MCV: 85.2 fL (ref 78.0–100.0)
Platelets: 107 10*3/uL — ABNORMAL LOW (ref 150–400)
RBC: 3.91 MIL/uL — AB (ref 4.22–5.81)
RDW: 14.1 % (ref 11.5–15.5)
WBC: 5 10*3/uL (ref 4.0–10.5)

## 2016-12-13 LAB — PROTEIN ELECTROPHORESIS, SERUM
A/G Ratio: 1.3 (ref 0.7–1.7)
ALPHA-1-GLOBULIN: 0.2 g/dL (ref 0.0–0.4)
ALPHA-2-GLOBULIN: 0.5 g/dL (ref 0.4–1.0)
Albumin ELP: 3.5 g/dL (ref 2.9–4.4)
Beta Globulin: 1 g/dL (ref 0.7–1.3)
GAMMA GLOBULIN: 0.9 g/dL (ref 0.4–1.8)
GLOBULIN, TOTAL: 2.6 (ref 2.2–3.9)
TOTAL PROTEIN ELP: 6.1 g/dL (ref 6.0–8.5)

## 2016-12-13 LAB — TROPONIN I
Troponin I: 0.12 ng/mL (ref <0.03)
Troponin I: 0.2 ng/mL (ref <0.03)

## 2016-12-13 LAB — TSH: TSH: 0.301 u[IU]/mL — AB (ref 0.350–4.500)

## 2016-12-13 LAB — GLUCOSE, CAPILLARY: Glucose-Capillary: 169 mg/dL — ABNORMAL HIGH (ref 65–99)

## 2016-12-13 LAB — HEPATITIS B SURFACE ANTIGEN: HEP B S AG: NEGATIVE

## 2016-12-13 LAB — MAGNESIUM: MAGNESIUM: 1.7 mg/dL (ref 1.7–2.4)

## 2016-12-13 MED ORDER — DIPHENHYDRAMINE HCL 25 MG PO CAPS
ORAL_CAPSULE | ORAL | Status: AC
  Filled 2016-12-13: qty 1

## 2016-12-13 MED ORDER — ALTEPLASE 2 MG IJ SOLR: 2.0000 mg | Freq: Once | INTRAMUSCULAR | Status: DC | PRN

## 2016-12-13 MED ORDER — PENTAFLUOROPROP-TETRAFLUOROETH EX AERO: 1.0000 "application " | INHALATION_SPRAY | CUTANEOUS | Status: DC | PRN

## 2016-12-13 MED ORDER — ACD FORMULA A 0.73-2.45-2.2 GM/100ML VI SOLN
Status: AC
  Administered 2016-12-13: 500 mL via INTRAVENOUS
  Filled 2016-12-13: qty 500

## 2016-12-13 MED ORDER — HYDRALAZINE HCL 20 MG/ML IJ SOLN
INTRAMUSCULAR | Status: AC
  Filled 2016-12-13: qty 10

## 2016-12-13 MED ORDER — LIDOCAINE-PRILOCAINE 2.5-2.5 % EX CREA
1.0000 "application " | TOPICAL_CREAM | CUTANEOUS | Status: DC | PRN
  Filled 2016-12-13: qty 5

## 2016-12-13 MED ORDER — CLONIDINE HCL 0.1 MG PO TABS
0.1000 mg | ORAL_TABLET | ORAL | Status: AC
  Administered 2016-12-13: 0.1 mg via ORAL
  Filled 2016-12-13 (×2): qty 1

## 2016-12-13 MED ORDER — SODIUM CHLORIDE 0.9 % IV SOLN: 100.0000 mL | INTRAVENOUS | Status: DC | PRN

## 2016-12-13 MED ORDER — ACETAMINOPHEN 325 MG PO TABS
ORAL_TABLET | ORAL | Status: AC
  Filled 2016-12-13: qty 2

## 2016-12-13 MED ORDER — MAGNESIUM SULFATE 4 GM/100ML IV SOLN
4.0000 g | Freq: Once | INTRAVENOUS | Status: AC
  Administered 2016-12-13: 4 g via INTRAVENOUS
  Filled 2016-12-13: qty 100

## 2016-12-13 MED ORDER — SENNOSIDES-DOCUSATE SODIUM 8.6-50 MG PO TABS: 1.0000 | ORAL_TABLET | Freq: Every evening | ORAL | Status: DC | PRN

## 2016-12-13 MED ORDER — SODIUM CHLORIDE 0.9 % IV SOLN
4.0000 g | Freq: Once | INTRAVENOUS | Status: DC
  Filled 2016-12-13: qty 40

## 2016-12-13 MED ORDER — ACETAMINOPHEN 325 MG PO TABS: 650.0000 mg | ORAL_TABLET | ORAL | Status: DC | PRN

## 2016-12-13 MED ORDER — ACETAMINOPHEN 650 MG RE SUPP: 650.0000 mg | RECTAL | Status: DC | PRN

## 2016-12-13 MED ORDER — SODIUM CHLORIDE 0.9 % IV SOLN: Freq: Once | INTRAVENOUS | Status: DC

## 2016-12-13 MED ORDER — METOPROLOL TARTRATE 25 MG PO TABS
25.0000 mg | ORAL_TABLET | Freq: Two times a day (BID) | ORAL | Status: DC
  Administered 2016-12-13 – 2016-12-14 (×2): 25 mg via ORAL
  Filled 2016-12-13 (×2): qty 1

## 2016-12-13 MED ORDER — CALCIUM CARBONATE ANTACID 500 MG PO CHEW: 2.0000 | CHEWABLE_TABLET | ORAL | Status: AC

## 2016-12-13 MED ORDER — ACETAMINOPHEN 325 MG PO TABS
650.0000 mg | ORAL_TABLET | ORAL | Status: DC | PRN
  Administered 2016-12-13: 650 mg via ORAL

## 2016-12-13 MED ORDER — STROKE: EARLY STAGES OF RECOVERY BOOK
Freq: Once | Status: DC
  Filled 2016-12-13: qty 1

## 2016-12-13 MED ORDER — ACD FORMULA A 0.73-2.45-2.2 GM/100ML VI SOLN
Status: AC
  Filled 2016-12-13: qty 1000

## 2016-12-13 MED ORDER — LIDOCAINE HCL (PF) 1 % IJ SOLN
5.0000 mL | INTRAMUSCULAR | Status: DC | PRN
Start: 2016-12-13 — End: 2016-12-14

## 2016-12-13 MED ORDER — ACETAMINOPHEN 160 MG/5ML PO SOLN: 650.0000 mg | ORAL | Status: DC | PRN

## 2016-12-13 MED ORDER — HEPARIN SODIUM (PORCINE) 1000 UNIT/ML DIALYSIS
1000.0000 [IU] | INTRAMUSCULAR | Status: DC | PRN
  Filled 2016-12-13: qty 1

## 2016-12-13 MED ORDER — ACD FORMULA A 0.73-2.45-2.2 GM/100ML VI SOLN
500.0000 mL | Status: DC
  Filled 2016-12-13: qty 500

## 2016-12-13 MED ORDER — DIPHENHYDRAMINE HCL 25 MG PO CAPS: 25.0000 mg | ORAL_CAPSULE | Freq: Four times a day (QID) | ORAL | Status: DC | PRN

## 2016-12-13 MED ORDER — ANTICOAGULANT SODIUM CITRATE 4% (200MG/5ML) IV SOLN
5.0000 mL | Freq: Once | Status: DC
  Filled 2016-12-13: qty 250

## 2016-12-13 NOTE — Progress Notes (Signed)
   12/13/16 1354  Vitals  Temp 97.8 F (36.6 C)  Temp Source Oral  BP (!) 188/103  BP Location Right Arm  BP Method Automatic  Patient Position (if appropriate) Lying  Pulse Rate 94  ECG Heart Rate 91  Resp 18  Oxygen Therapy  SpO2 96 %  O2 Device Room Air  Pt arrived back from HD, reported off from HD RN pt had HTN episode and BP returned to stable state prior to pt receiving BP med, pt VS as above, pt BP meds given, MD [place] S[place] updated, nursing will cont to monitor pt

## 2016-12-13 NOTE — Progress Notes (Addendum)
Patient ID: Frank Fuller, male   DOB: 06-06-1961, 56 y.o.   MRN: [id number] S:Does not feel well and more agitated today.  Patient was seen on dialysis and the procedure was supervised. BFR [address] BP is variable from 200/108 down to 147/97.   O:BP (!) 148/87 (BP Location: Right Arm)   Pulse 81   Temp 98.7 F (37.1 C) (Oral)   Resp 20   Ht 6\' 3"  (1.905 m)   Wt 81.4 kg (179 lb 7.3 oz)   SpO2 100%   BMI 22.43 kg/m   Intake/Output Summary (Last 24 hours) at 12/13/16 0903 Last data filed at 12/13/16 0600  Gross per 24 hour  Intake             1150 ml  Output              700 ml  Net              450 ml   Intake/Output: I/O last 3 completed shifts: In: 1150 [P.O.:1120; I.V.:30] Out: 1150 [Urine:1150]  Intake/Output this shift:  No intake/output data recorded. Weight change: 0.9 kg (1 lb 15.7 oz) Gen:WD AAM in mild distress, slowed mentation CVS:no rub Resp:cta YIF:OYDXAJ Ext:no edema   Recent Labs Lab 12/10/16 1845 12/11/16 0705 12/12/16 0239 12/13/16 0343 12/13/16 0353  NA 136 134* 132* 128* 129*  K 3.6 3.7 3.6 4.1 4.1  CL 100* 100* 99* 95* 95*  CO2 20* 22 20* 20*  --   GLUCOSE 147* 119* 112* 141* 135*  BUN 93* 97* 102* 107* 114*  CREATININE 8.67* 9.12* 9.78* 10.36* 11.00*  ALBUMIN 3.5 3.4* 2.8* 2.8*  --   CALCIUM 9.1 9.2 8.5* 8.7*  --   PHOS 4.7* 4.5 4.4 5.5*  --   AST 41  --   --   --   --   ALT 30  --   --   --   --    Liver Function Tests:  Recent Labs Lab 12/10/16 1845 12/11/16 0705 12/12/16 0239 12/13/16 0343  AST 41  --   --   --   ALT 30  --   --   --   ALKPHOS 57  --   --   --   BILITOT 1.8*  --   --   --   PROT 6.1*  --   --   --   ALBUMIN 3.5 3.4* 2.8* 2.8*    Recent Labs Lab 12/10/16 1938  LIPASE 33   No results for input(s): AMMONIA in the last 168 hours. CBC:  Recent Labs Lab 12/10/16 1845 12/11/16 0705 12/12/16 0239 12/13/16 0342 12/13/16 0353  WBC 7.8 8.6 7.5 5.0  --   NEUTROABS 5.6  --   --   --   --   HGB 14.6  13.9 11.6* 11.6* 11.6*  HCT 40.9 39.2 32.4* 33.3* 34.0*  MCV 84.5 85.2 85.0 85.2  --   PLT 55* 71* 74* 107*  --    Cardiac Enzymes:  Recent Labs Lab 12/10/16 1845 12/10/16 2355 12/11/16 0705  TROPONINI 0.53* 0.50* 0.46*   CBG: No results for input(s): GLUCAP in the last 168 hours.  Iron Studies: No results for input(s): IRON, TIBC, TRANSFERRIN, FERRITIN in the last 72 hours. Studies/Results: Dg Chest 2 View  Result Date: 12/11/2016 CLINICAL DATA:  Acute renal failure. No current cardiopulmonary complaints. Smoker, history of hypertension EXAM: CHEST  2 VIEW COMPARISON:  None in PACs FINDINGS: The lungs are adequately inflated  and clear. The heart and pulmonary vascularity are normal. There is calcification in the wall of the thoracic aorta. There is no pleural effusion. The mediastinum is normal in width. The bony thorax exhibits no acute abnormality. IMPRESSION: There is acute cardiopulmonary abnormality. Thoracic aortic atherosclerosis. Electronically Signed   By: David  Martinique M.D.   On: 12/11/2016 12:42   Dg Abd 1 View  Result Date: 12/12/2016 CLINICAL DATA:  56 y/o  M; evaluation for metal prior to MRI. EXAM: ABDOMEN - 1 VIEW COMPARISON:  None. FINDINGS: Punctate metallic fragments project over the left hemi sacrum and there is a 20 mm fragment projecting over the left ilium. Normal bowel gas pattern. Lower lumbar spine degenerative changes. No acute osseous abnormality identified. IMPRESSION: Metal fragments projecting over the pelvis. Electronically Signed   By: Kristine Garbe M.D.   On: 12/12/2016 20:17   Ct Head Wo Contrast  Result Date: 12/12/2016 CLINICAL DATA:  Acute encephalopathy.  Hypertension EXAM: CT HEAD WITHOUT CONTRAST TECHNIQUE: Contiguous axial images were obtained from the base of the skull through the vertex without intravenous contrast. COMPARISON:  None. FINDINGS: Brain: Ventricle size and cerebral volume normal. Extensive white matter hypodensity  diffusely. Brainstem and cerebellum normal. Negative for hemorrhage or mass. No shift of the midline structures. Vascular: No hyperdense vessel or unexpected calcification. Skull: Negative Sinuses/Orbits: Negative Other: None IMPRESSION: Extensive white matter disease bilaterally. No prior studies available however this pattern is most consistent with advanced chronic microvascular ischemia. No acute infarct or hemorrhage. Electronically Signed   By: Franchot Gallo M.D.   On: 12/12/2016 19:47   Mr Brain Wo Contrast  Result Date: 12/12/2016 CLINICAL DATA:  Headache and hypertension.  Right body numbness EXAM: MRI HEAD WITHOUT CONTRAST TECHNIQUE: Multiplanar, multiecho pulse sequences of the brain and surrounding structures were obtained without intravenous contrast. COMPARISON:  CT head 12/12/2016 FINDINGS: Brain: Small linear area of restricted diffusion in the left frontal white matter most consistent with acute infarct. Small area of restricted diffusion in the right parietal white matter also consistent with acute infarct. Extensive hyperintensity throughout the periventricular and deep white matter bilaterally. Hyperintensity in the pons bilaterally extending into the cerebellum bilaterally. These findings are most likely due to chronic ischemia. Negative for hemorrhage or mass. Ventricle size normal. No shift of the midline structures. Vascular: Normal arterial flow void Skull and upper cervical spine: Negative Sinuses/Orbits: Negative Other: None IMPRESSION: Small areas of restricted diffusion in the left frontal lobe and right parietal lobe most consistent with acute infarction. Extensive white matter changes bilaterally most compatible chronic microvascular ischemia. Hyperintensity in the pons and cerebellum most likely due to chronic ischemia. Given history severe hypertension, PRES also a consideration. Electronically Signed   By: Franchot Gallo M.D.   On: 12/12/2016 21:10   US Renal  Result Date:  12/11/2016 CLINICAL DATA:  Acute renal insufficiency. EXAM: RENAL / URINARY TRACT ULTRASOUND COMPLETE COMPARISON:  None in PACs FINDINGS: Right Kidney: Length: 10.4 cm. The renal cortical echotexture is increased and is greater than that of the adjacent liver. There is no hydronephrosis. Left Kidney: Length: 10.1 cm. The renal cortical echotexture is increased similar to that on the right. There is a lower pole simple appearing cyst measuring 1.2 cm in greatest dimension. There is no hydronephrosis. Bladder: The urinary bladder is decompressed by a Foley catheter. IMPRESSION: Increased renal cortical echotexture consistent with medical renal disease. No hydronephrosis. The urinary bladder is decompressed by a Foley catheter. Electronically Signed   By: Shanon Brow  Martinique M.D.   On: 12/11/2016 13:12   Dg Chest Port 1 View  Result Date: 12/12/2016 CLINICAL DATA:  Central line placement EXAM: PORTABLE CHEST 1 VIEW COMPARISON:  12/11/2016 FINDINGS: New right internal jugular central line tip in the SVC at the azygos level, 6 cm above the right atrium. No pneumothorax. Lungs remain clear. IMPRESSION: Right internal jugular central line tip in the SVC at the azygos level. Electronically Signed   By: Nelson Chimes M.D.   On: 12/12/2016 15:27   . sodium chloride   Intravenous Once  . sodium chloride   Intravenous Once  . amLODipine  10 mg Oral Daily  . aspirin EC  81 mg Oral Daily  . cloNIDine  0.1 mg Oral TID  . feeding supplement  1 Container Oral TID BM  . folic acid  1 mg Oral Daily  . heparin  2.4 mL Intravenous Once  . hydrALAZINE  50 mg Oral Q8H  . isosorbide mononitrate  60 mg Oral Daily  . nicotine  14 mg Transdermal Q24H  . polyethylene glycol  17 g Oral Daily  . predniSONE  60 mg Oral Q breakfast  . senna-docusate  1 tablet Oral BID  . sodium chloride flush  3 mL Intravenous Q12H    BMET    Component Value Date/Time   NA 129 (L) 12/13/2016 0353   K 4.1 12/13/2016 0353   CL 95 (L) 12/13/2016  0353   CO2 20 (L) 12/13/2016 0343   GLUCOSE 135 (H) 12/13/2016 0353   BUN 114 (H) 12/13/2016 0353   CREATININE 11.00 (H) 12/13/2016 0353   CALCIUM 8.7 (L) 12/13/2016 0343   GFRNONAA 5 (L) 12/13/2016 0343   GFRAA 6 (L) 12/13/2016 0343   CBC    Component Value Date/Time   WBC 5.0 12/13/2016 0342   RBC 3.91 (L) 12/13/2016 0342   HGB 11.6 (L) 12/13/2016 0353   HCT 34.0 (L) 12/13/2016 0353   PLT 107 (L) 12/13/2016 0342   MCV 85.2 12/13/2016 0342   MCH 29.7 12/13/2016 0342   MCHC 34.8 12/13/2016 0342   RDW 14.1 12/13/2016 0342   LYMPHSABS 1.2 12/10/2016 1845   MONOABS 1.1 (H) 12/10/2016 1845   EOSABS 0.0 12/10/2016 1845   BASOSABS 0.0 12/10/2016 1845     Assessment/Plan:  1. AKI- in setting of malignant hypertension, thrombocytopenia, concern for TTP vs. Acute RPGN/TMA.   1. S/p temp HD catheter placement by PCCM on 12/12/16 2. started TPE for presumed TTP 12/12/16 3. Will plan for HD today before TPE due to rising BUN/Cr.   4. Normal complements, negative Hep B, C, HIV, anti GBM, ANA, and ANCA  2. Thrombocytopenia with suspicion for TTP/HUS:  Fever, ARF, thrombocytopenia, hemolytic anemia (low haptoglobin, +schistocytes), and AMS  1. Discussed with Heme and PCCM.   2. Platelets are up after first TPE, will continue to follow 3. ADAMTS13 activity 63 % only mildly decreased 3. Malignant HTN- improving with medications 4. AMS- ?TTP but also with new small acute left frontal ischemic CVA per MRI  Donetta Potts, MD [facility] [phone]

## 2016-12-13 NOTE — Progress Notes (Signed)
STROKE TEAM PROGRESS NOTE   SUBJECTIVE (INTERVAL HISTORY) Patient currently in hemodialysis. Feels back to baseline. No new complaints.   OBJECTIVE Temp:  [97.8 F (36.6 C)-98.7 F (37.1 C)] 97.8 F (36.6 C) (02/21 1000) Pulse Rate:  [70-123] 80 (02/21 1130) Cardiac Rhythm: Normal sinus rhythm (02/21 1000) Resp:  [12-20] 19 (02/21 1130) BP: (134-182)/(77-107) 165/87 (02/21 1130) SpO2:  [97 %-100 %] 98 % (02/21 1000) Weight:  [81 kg (178 lb 9.2 oz)-82.5 kg (181 lb 14.1 oz)] 82.5 kg (181 lb 14.1 oz) (02/21 1000)  CBC:  Recent Labs Lab 12/10/16 1845  12/12/16 0239 12/13/16 0342 12/13/16 0353  WBC 7.8  < > 7.5 5.0  --   NEUTROABS 5.6  --   --   --   --   HGB 14.6  < > 11.6* 11.6* 11.6*  HCT 40.9  < > 32.4* 33.3* 34.0*  MCV 84.5  < > 85.0 85.2  --   PLT 55*  < > 74* 107*  --   < > = values in this interval not displayed.  Basic Metabolic Panel:  Recent Labs Lab 12/10/16 1845  12/12/16 0239 12/13/16 0343 12/13/16 0353  NA 136  < > 132* 128* 129*  K 3.6  < > 3.6 4.1 4.1  CL 100*  < > 99* 95* 95*  CO2 20*  < > 20* 20*  --   GLUCOSE 147*  < > 112* 141* 135*  BUN 93*  < > 102* 107* 114*  CREATININE 8.67*  < > 9.78* 10.36* 11.00*  CALCIUM 9.1  < > 8.5* 8.7*  --   MG 1.9  --   --   --   --   PHOS 4.7*  < > 4.4 5.5*  --   < > = values in this interval not displayed.  Lipid Panel: No results found for: CHOL, TRIG, HDL, CHOLHDL, VLDL, LDLCALC   PHYSICAL EXAM Frail middle aged african [place] male not in distress. . Afebrile. Head is nontraumatic. Neck is supple without bruit.    Cardiac exam no murmur or gallop. Lungs are clear to auscultation. Distal pulses are well felt. Neurological Exam ;  Awake  Alert oriented x 3. Normal speech and language.eye movements full without nystagmus.fundi were not visualized. Vision acuity and fields appear normal. Hearing is normal. Palatal movements are normal. Face symmetric. Tongue midline. Normal strength, tone, reflexes and  coordination. Normal sensation. Gait deferred.  ASSESSMENT/PLAN [name] is a 56 y.o. male with history of HTN and smoking admitted with vomiting x 3 days who was found to be thrombocytopenic with adute kidney injury. He also developed transient right-sided numbness and weakness. MRI showed an acute small L frontal ischemic infarct.Marland Kitchen He did not receive IV t-PA due to thrombocytopenia, minimal deficits.   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 Extensive white matter disease. No acute abnormality  MRI  small L frontal ischemic infarct  MRA head  pending   MRA neck pending   Carotid Doppler  pending   2D Echo  EF 50-55%. No source of embolus   LDL pending   HgbA1c 5.2  SCDs for VTE prophylaxis  Diet renal with fluid restriction Fluid restriction: 1500 mL Fluid; Room service appropriate? Yes; Fluid consistency: Thin  No antithrombotic prior to admission, now on No antithrombotic given thrombocytopenia (platelets increasing 55->107)  Therapy recommendations:  pending   Disposition:  pending  Malignant Hypertension  As high as 190/125 on arrival  Noncompliant with meds at home  ACE inhibitor on hold  Initially on On NTG drip  Neurologic symptom worsening with rapid lowering of  BP At this time, Gradual lowering of BP to normotensive range. Will adjust if PRES confirmed.  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 Agree with note above.  He presented with significantly elevated blood pressure and transient right-sided weakness and MRI scan shows bilateral small lacunar infarcts from small vessel disease. Remains at risk for neurological worsening, recurrent stroke, TIA needs aggressive blood pressure control and close neurological monitoring. Continue aspirin for stroke prevention and deep systolic blood pressure between 140 and 180. Greater than 50% time during this 25 minute visit was spent on counseling and coordination of care about his strokes, small vessel disease, risk factor modification and answered questions  Antony Contras, MD Medical Director [facility] Pager: [phone] 12/13/2016 3:24 PM  To contact Stroke Continuity provider, please refer to [email or web address] After hours, contact General Neurology

## 2016-12-13 NOTE — Progress Notes (Signed)
Was called by nursing that patient had approximately 50 beat run of SVT. Per nursing and patient patient with complaints of headache, dizziness, palpitations, and not feeling well while in hemodialysis this morning at which time patient was noted to have a blood pressure 188/103. Patient's blood pressure subsequently improved. And patient returned to the floor. Subjective: She states not feeling too well. Patient with some complaints of headache which she states are improving, some dizziness and also noted some palpitations. Vitals blood pressure 188/103, pulse of 94, respirations 18, sats 96% on room air. Gen.: Laying in bed in no acute distress Respiratory: Clear to auscultation bilaterally Cardiovascular: Regular rate rhythm no murmurs rubs or gallops Abdomen: Soft, nontender, nondistended, positive bowel sounds Extremities: No clubbing cyanosis or edema Telemetry with at 38-40 beat run of SVT. Assessment/plan #1 38-40 beat run of SVT Questionable etiology. Patient stated during hemodialysis had a headache, dizziness, palpitations which have since improved. Will cycle cardiac enzymes every 6 hours 3. Check a magnesium level. Check a TSH. Repeat EKG. Repeat vital signs. Patient with recent 2-D echo 12/12/2016 with a EF of 50-55%, no wall motion abnormalities, grade 1 diastolic dysfunction, mild LVH, no significant valvular abnormalities. Continue current regimen of Norvasc, clonidine, hydralazine,imdur for blood pressure control. Will consult with cardiology for further evaluation and management.

## 2016-12-13 NOTE — Consult Note (Signed)
Date: 12/13/2016               Patient Name:  Frank Fuller MRN: [id number]  DOB: [birth year] Age / Sex: 56 y.o., male   PCP: No primary care provider on file.         Requesting Physician: Dr. Eugenie Filler, MD    Consulting Reason:  Sustained SVT     Chief Complaint:   History of Present Illness: Pt is a 56 yo F with a PMHx of HTN transferred from  [facility] to [facility] on 12/10/2016 with complaint of nausea, vomiting, hiccups, and generalized malaise found to have acute kidney injury, malignant HTN, and thrombocytopenia. An acute CVA noted on MRI of head done 12/12/2016. Cardiology has been consulted as patient was found to have an episode of SVT on telemetry and was symptomatic at that time. Patient states he started having dizziness, headaches, sweating, body aches, nausea, chills, palpitations, chest pain, and numbness in both of his hands during dialysis. He describes it as a "panic attack" and states he had trouble breathing at that time. States his symptoms have improved but he still continues to have headaches, chills, and body aches at present. Per nursing staff, patient was in dialysis from 10:35 AM to 1:51 PM. Review of telemetry shows patient had a 36 beat run of wide complex tachycardia at 2:36 PM. EKG done this afternoon showing TWI in leads V4-6 and aVL, pattern consistent with LVH, and 1st degree heart block. Findings are similar to prior tracing from admission. HR 88 on EKG done today. Echo done 12/12/2016 with a EF of 40-98%, grade 1 diastolic dysfunction, and mild LVH. Chart review does not show any prior history of coronary artery disease.   Meds: Current Facility-Administered Medications  Medication Dose Route Frequency Provider Last Rate Last Dose  .  stroke: mapping our early stages of recovery book   Does not apply Once Irine Seal V, MD      . 0.9 %  sodium chloride infusion   Intravenous Once Donato Heinz, MD      . 0.9 %  sodium chloride infusion    Intravenous Once Donato Heinz, MD      . acetaminophen (TYLENOL) 325 MG tablet           . acetaminophen (TYLENOL) tablet 650 mg  650 mg Oral Q4H PRN Eugenie Filler, MD   650 mg at 12/13/16 1140   Or  . acetaminophen (TYLENOL) solution 650 mg  650 mg Per Tube Q4H PRN Eugenie Filler, MD       Or  . acetaminophen (TYLENOL) suppository 650 mg  650 mg Rectal Q4H PRN Eugenie Filler, MD      . amLODipine (NORVASC) tablet 10 mg  10 mg Oral Daily Lavina Hamman, MD   10 mg at 12/13/16 1511  . chlorproMAZINE (THORAZINE) 12.5 mg in sodium chloride 0.9 % 25 mL IVPB  12.5 mg Intravenous Q8H PRN Lavina Hamman, MD      . cloNIDine (CATAPRES) tablet 0.1 mg  0.1 mg Oral TID Lavina Hamman, MD   0.1 mg at 12/12/16 2143  . feeding supplement (BOOST / RESOURCE BREEZE) liquid 1 Container  1 Container Oral TID BM Lavina Hamman, MD   1 Container at 12/12/16 1457  . folic acid (FOLVITE) tablet 1 mg  1 mg Oral Daily Lavina Hamman, MD   1 mg at 12/13/16 1511  . heparin injection 2,400 Units  2.4  mL Intravenous Once Corey Harold, NP      . hydrALAZINE (APRESOLINE) injection 10 mg  10 mg Intravenous Q4H PRN Lavina Hamman, MD   10 mg at 12/12/16 1822  . hydrALAZINE (APRESOLINE) tablet 50 mg  50 mg Oral Q8H Lavina Hamman, MD   50 mg at 12/13/16 1408  . isosorbide mononitrate (IMDUR) 24 hr tablet 60 mg  60 mg Oral Daily Lavina Hamman, MD   60 mg at 12/13/16 1511  . nicotine (NICODERM CQ - dosed in mg/24 hours) patch 14 mg  14 mg Transdermal Q24H Lavina Hamman, MD   14 mg at 12/12/16 2000  . ondansetron (ZOFRAN) tablet 4 mg  4 mg Oral Q6H PRN Lavina Hamman, MD       Or  . ondansetron [facility]) injection 4 mg  4 mg Intravenous Q6H PRN Lavina Hamman, MD   4 mg at 12/12/16 2143  . polyethylene glycol (MIRALAX / GLYCOLAX) packet 17 g  17 g Oral Daily Lavina Hamman, MD   17 g at 12/13/16 1522  . predniSONE (DELTASONE) tablet 60 mg  60 mg Oral Q breakfast Donato Heinz, MD   60 mg at 12/13/16 9379  .  promethazine (PHENERGAN) injection 12.5 mg  12.5 mg Intravenous Q6H PRN Lavina Hamman, MD      . senna-docusate (Senokot-S) tablet 1 tablet  1 tablet Oral BID Lavina Hamman, MD   1 tablet at 12/12/16 2143  . senna-docusate (Senokot-S) tablet 1 tablet  1 tablet Oral QHS PRN Irine Seal V, MD      . sodium chloride flush (NS) 0.9 % injection 10-40 mL  10-40 mL Intracatheter PRN Lavina Hamman, MD   30 mL at 12/12/16 1557  . sodium chloride flush (NS) 0.9 % injection 3 mL  3 mL Intravenous Q12H Lavina Hamman, MD   3 mL at 12/13/16 0900    Allergies: Allergies as of 12/10/2016  . (No Known Allergies)   Past Medical History:  Diagnosis Date  . Hypertension    History reviewed. No pertinent surgical history. Family History  Problem Relation Age of Onset  . Hypertension Mother    Social History   Social History  . Marital status: Single    Spouse name: N/A  . Number of children: N/A  . Years of education: N/A   Occupational History  . Not on file.   Social History Main Topics  . Smoking status: Current Every Day Smoker    Packs/day: 1.00    Years: 27.00    Types: Cigarettes  . Smokeless tobacco: Never Used  . Alcohol use No  . Drug use: No  . Sexual activity: Yes    Partners: Female    Birth control/ protection: None   Other Topics Concern  . Not on file   Social History Narrative  . No narrative on file    Review of Systems: Pertinent positives mentioned in HPI. Remainder of all ROS negative.   Physical Exam: Blood pressure (!) 188/103, pulse 94, temperature 97.8 F (36.6 C), temperature source Oral, resp. rate 18, height 6\' 3"  (1.905 m), weight 181 lb 14.1 oz (82.5 kg), SpO2 96 %. Physical Exam  Constitutional: He is oriented to person, place, and time. He appears well-developed and well-nourished. No distress.  Sitting comfortably in hospital bed watching TV  HENT:  Head: Normocephalic and atraumatic.  Eyes: EOM are normal.  Cardiovascular: Normal  rate, regular rhythm and intact distal  pulses.  Exam reveals no gallop and no friction rub.   No murmur heard. Pulmonary/Chest: Effort normal and breath sounds normal. No respiratory distress. He has no wheezes. He has no rales.  Abdominal: Soft. Bowel sounds are normal. He exhibits no distension. There is no tenderness.  Musculoskeletal: He exhibits no edema.  Neurological: He is alert and oriented to person, place, and time.  Skin: Skin is warm and dry.    Lab results: Basic Metabolic Panel:  Recent Labs  12/10/16 1845  12/12/16 0239 12/13/16 0343 12/13/16 0353  NA 136  < > 132* 128* 129*  K 3.6  < > 3.6 4.1 4.1  CL 100*  < > 99* 95* 95*  CO2 20*  < > 20* 20*  --   GLUCOSE 147*  < > 112* 141* 135*  BUN 93*  < > 102* 107* 114*  CREATININE 8.67*  < > 9.78* 10.36* 11.00*  CALCIUM 9.1  < > 8.5* 8.7*  --   MG 1.9  --   --   --   --   PHOS 4.7*  < > 4.4 5.5*  --   < > = values in this interval not displayed. Liver Function Tests:  Recent Labs  12/10/16 1845  12/12/16 0239 12/13/16 0343  AST 41  --   --   --   ALT 30  --   --   --   ALKPHOS 57  --   --   --   BILITOT 1.8*  --   --   --   PROT 6.1*  --   --   --   ALBUMIN 3.5  < > 2.8* 2.8*  < > = values in this interval not displayed.  Recent Labs  12/10/16 1938  LIPASE 33   No results for input(s): AMMONIA in the last 72 hours. CBC:  Recent Labs  12/10/16 1845  12/12/16 0239 12/13/16 0342 12/13/16 0353  WBC 7.8  < > 7.5 5.0  --   NEUTROABS 5.6  --   --   --   --   HGB 14.6  < > 11.6* 11.6* 11.6*  HCT 40.9  < > 32.4* 33.3* 34.0*  MCV 84.5  < > 85.0 85.2  --   PLT 55*  < > 74* 107*  --   < > = values in this interval not displayed. Cardiac Enzymes:  Recent Labs  12/10/16 1845 12/10/16 2355 12/11/16 0705  TROPONINI 0.53* 0.50* 0.46*   BNP: No results for input(s): PROBNP in the last 72 hours. D-Dimer: No results for input(s): DDIMER in the last 72 hours. CBG:  Recent Labs  12/13/16 1317    GLUCAP 169*   Hemoglobin A1C:  Recent Labs  12/10/16 1845  HGBA1C 5.2   Fasting Lipid Panel: No results for input(s): CHOL, HDL, LDLCALC, TRIG, CHOLHDL, LDLDIRECT in the last 72 hours. Thyroid Function Tests:  Recent Labs  12/10/16 1845  TSH 0.669   Anemia Panel:  Recent Labs  12/10/16 1845 12/10/16 1938  VITAMINB12  --  372  RETICCTPCT 1.5  --    Coagulation:  Recent Labs  12/10/16 1845  LABPROT 13.6  INR 1.03   Urine Drug Screen: Drugs of Abuse     Component Value Date/Time   LABOPIA NONE DETECTED 12/11/2016 0545   COCAINSCRNUR NONE DETECTED 12/11/2016 0545   LABBENZ NONE DETECTED 12/11/2016 0545   AMPHETMU NONE DETECTED 12/11/2016 0545   THCU POSITIVE (A) 12/11/2016 0545   LABBARB NONE  DETECTED 12/11/2016 0545    Alcohol Level: No results for input(s): ETH in the last 72 hours. Urinalysis:  Recent Labs  12/11/16 0545  COLORURINE YELLOW  LABSPEC 1.012  PHURINE 5.0  GLUCOSEU NEGATIVE  HGBUR LARGE*  BILIRUBINUR NEGATIVE  KETONESUR NEGATIVE  PROTEINUR >=300*  NITRITE NEGATIVE  LEUKOCYTESUR TRACE*   Imaging results:  Dg Abd 1 View  Result Date: 12/12/2016 CLINICAL DATA:  56 y/o  M; evaluation for metal prior to MRI. EXAM: ABDOMEN - 1 VIEW COMPARISON:  None. FINDINGS: Punctate metallic fragments project over the left hemi sacrum and there is a 20 mm fragment projecting over the left ilium. Normal bowel gas pattern. Lower lumbar spine degenerative changes. No acute osseous abnormality identified. IMPRESSION: Metal fragments projecting over the pelvis. Electronically Signed   By: Kristine Garbe M.D.   On: 12/12/2016 20:17   Ct Head Wo Contrast  Result Date: 12/12/2016 CLINICAL DATA:  Acute encephalopathy.  Hypertension EXAM: CT HEAD WITHOUT CONTRAST TECHNIQUE: Contiguous axial images were obtained from the base of the skull through the vertex without intravenous contrast. COMPARISON:  None. FINDINGS: Brain: Ventricle size and cerebral  volume normal. Extensive white matter hypodensity diffusely. Brainstem and cerebellum normal. Negative for hemorrhage or mass. No shift of the midline structures. Vascular: No hyperdense vessel or unexpected calcification. Skull: Negative Sinuses/Orbits: Negative Other: None IMPRESSION: Extensive white matter disease bilaterally. No prior studies available however this pattern is most consistent with advanced chronic microvascular ischemia. No acute infarct or hemorrhage. Electronically Signed   By: Franchot Gallo M.D.   On: 12/12/2016 19:47   Mr Brain Wo Contrast  Result Date: 12/12/2016 CLINICAL DATA:  Headache and hypertension.  Right body numbness EXAM: MRI HEAD WITHOUT CONTRAST TECHNIQUE: Multiplanar, multiecho pulse sequences of the brain and surrounding structures were obtained without intravenous contrast. COMPARISON:  CT head 12/12/2016 FINDINGS: Brain: Small linear area of restricted diffusion in the left frontal white matter most consistent with acute infarct. Small area of restricted diffusion in the right parietal white matter also consistent with acute infarct. Extensive hyperintensity throughout the periventricular and deep white matter bilaterally. Hyperintensity in the pons bilaterally extending into the cerebellum bilaterally. These findings are most likely due to chronic ischemia. Negative for hemorrhage or mass. Ventricle size normal. No shift of the midline structures. Vascular: Normal arterial flow void Skull and upper cervical spine: Negative Sinuses/Orbits: Negative Other: None IMPRESSION: Small areas of restricted diffusion in the left frontal lobe and right parietal lobe most consistent with acute infarction. Extensive white matter changes bilaterally most compatible chronic microvascular ischemia. Hyperintensity in the pons and cerebellum most likely due to chronic ischemia. Given history severe hypertension, PRES also a consideration. Electronically Signed   By: Franchot Gallo M.D.    On: 12/12/2016 21:10   Dg Chest Port 1 View  Result Date: 12/12/2016 CLINICAL DATA:  Central line placement EXAM: PORTABLE CHEST 1 VIEW COMPARISON:  12/11/2016 FINDINGS: New right internal jugular central line tip in the SVC at the azygos level, 6 cm above the right atrium. No pneumothorax. Lungs remain clear. IMPRESSION: Right internal jugular central line tip in the SVC at the azygos level. Electronically Signed   By: Nelson Chimes M.D.   On: 12/12/2016 15:27    Other results: EKG: TWI in leads V4-6 and aVL, pattern consistent with LVH, and 1st degree heart block. Findings are similar to prior tracing from admission.  Telemetry: 36 beat run of wide complex tachycardia at 2:36 PM. Personally reviewed.   Assessment,  Plan, & Recommendations by Problem: Principal Problem:   ARF (acute renal failure) (HCC) Active Problems:   Thrombocytopenia (HCC)   Elevated troponin   Malignant hypertension   Hiccups   Elevated bilirubin   Nausea with vomiting   Uremia   Thrombotic microangiopathy (HCC)   CVA (cerebral vascular accident) ([place])   Hyponatremia   SVT (supraventricular tachycardia) ([place])  Wide complex tachycardia: Patient has a 36 beat run of wide complex tachycardia on telemetry this afternoon and was not feeling well at dialysis this morning. Currently feeling better and hemodynamically stable. Differentials include SVT and SVT with aberrancy. VT less likely as patient had a normal EF on recent echo. Troponin 0.12 today; was 0.50> 0.46 previously. Elevated troponin likely in the setting of demand ischemia as patient was admitted for malignant hypertension. Electrolytes including K and Mag normal. TSH only borderline low at 0.301.  -Continue to monitor on telemetry  -Start Metoprolol 25 mg BID  Malignant hypertension: In the setting of noncompliance with medications at home. -Goal systolic blood pressure between 140-180 in the setting of CVA as per neurology recommendation  -Discontinue  Imdur -Start Metoprolol 25 mg BID -Continue amlodipine, clonidine, and hydralazine.   AKI: In the setting of malignant HTN and thrombocytopenia. There is concern for TTP vs. Acute RPGN/TMA. Currently undergoing HD.  -Nephrology following   Thrombocytopenia: Pt presented with ARF, thrombocytopenia, hemolytic anemia with low haptoglobin and elevated LDH, and some alteration in his mental status. Mental status now improved. Suspicion for TTP/HUS. Platelets are up after plasma exchange. -Hematology following   Acute CVA:  Per MRI.  -Neurology following   Signed: Shela Leff, MD 12/13/2016, 3:48 PM   Patient seen, examined. Available data reviewed. Agree with findings, assessment, and plan as outlined by Dr Marlowe Sax. The patient is independently interviewed and examined. He is alert and oriented, in no distress. His affect is somewhat flat. JVP is normal, carotid upstrokes normal without bruits, lungs are clear to auscultation bilaterally, heart is regular rate and rhythm with an S4 gallop but no systolic or diastolic murmur. Abdomen is soft and nontender. Extremities are without edema. Telemetry is carefully reviewed and the patient has a rod of regular wide complex tachycardia. I suspect this is SVT with aberrancy. The patient has normal LV systolic function, normal electrolytes, and no evidence of acute coronary syndrome. His troponin has been mildly elevated in the setting of acute renal failure and malignant hypertension. Recommend addition of metoprolol 25 mg twice daily. Will discontinue isosorbide and monitor his blood pressure closely. Neurology recommendations reviewed today in order to keep his systolic blood pressure a proximally 140-180 mmHg. He did not have clear symptoms at the time of his short run of wide-complex tachycardia. Will follow with you.  Sherren Mocha, M.D. 12/13/2016 6:59 PM

## 2016-12-13 NOTE — Progress Notes (Signed)
Pt SBP 210/98; c/o of headache; Tylenol 650 mg given for the headche; headche persisted and SBP rechecked at 183/104; Dr. Donato Heinz, Nephrology made aware and he ordered clonidine 0.1mg  PO which was requested STAT from pharmacy. Due to the delay and pt condition not changing; hydralazine 10mg  injection was removed pyxis but was not give because pt SBP dropped to 147/97; pt noted diaphoretic, weak  and confused. Both hydralazine and clonidine withheld. Dr. Mary Sella rounded to see pt in HD. 10 mins upon completing HD; pt states he is feeling a little better. Paged [name] and informed him of the brief hypertensive episode; he stated pt has brittle BPs and to keep his SBP parameters 140 -160's. Report given to Cleatrice Burke, Floor RN.

## 2016-12-13 NOTE — Progress Notes (Signed)
PROGRESS NOTE    Frank Fuller  PTW:[id number] DOB: [birth year] DOA: 12/10/2016 PCP: No primary care provider on file.    Brief Narrative:  Pt. with PMH of hypertension; admitted on 12/10/2016, with complaint of nausea vomiting, hiccups, generalized malaise, was found to have acute kidney injury and thrombocytopenia. Nephrology consulted who in turn has consulted hematology at present. Low haptoglobin, hematology recommended plasmapheresis, CCM with insert HD catheter. Currently further plan is plasmapheresis and monitor for renal recovery   Assessment & Plan:   Principal Problem:   ARF (acute renal failure) (HCC) Active Problems:   Thrombocytopenia (HCC)   Elevated troponin   Malignant hypertension   Hiccups   Elevated bilirubin   Nausea with vomiting   Uremia   Thrombotic microangiopathy (HCC)   CVA (cerebral vascular accident) ([place])   Hyponatremia  #1 acute renal failure ?? Etiology. Patient presented with malignant hypertension in the setting of thrombocytopenia. 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 Patient noted to present with acute renal failure, thrombocytopenia, hemolytic anemia with low haptoglobin, elevated LDH, elevated bilirubin, altered mental status and now with an acute CVA noted on MRI of the head 12/12/2016. ANA antibody was negative. Anchor proteinase 3 less than 3.5. GBM antibodies was 3. C-ANCA was less than 1:20, p-ANCA <1:20, C3 of 86, C4 of 21.ADAMTS 13 activity pending. Hep B antibody nonreactive, hep B surface antigen negative. HIV nonreactive. Hep C viral antibody greater than 11.0. HD catheter placed yesterday per P CCM and patient started on TPE on 12/12/2016 for presumed TTP +/- HD as needed. Per nephrology.  #2 thrombocytopenia with suspicion for TTP/HUS Patient had presented with acute renal failure, thrombocytopenia, hemolytic anemia with low haptoglobin, elevated LDH with some associated altered mental status. Mental status has improved.  Thrombocytopenia improving with TPE. Renal function worsened creatinine currently at 11 with a BUN of 114. Hematology and nephrology following.  #3 hyponatremia Likely secondary to problem #1. Follow.  #4 acute CVA MRI of the brain 12/12/2016 showing small areas of restricted diffusion the left frontal lobe and right parietal lobe was consistent with acute infarction. Patient was noted on 12/12/2016 to have some slurred speech and right upper extremity weakness and numbness which has since resolved. CT head negative. Stroke workup underway with carotid Dopplers, MRA of the head and neck, hemoglobin A1c, fasting lipid panel. 2-D echo which was done with a EF of 50-55%, no wall motion abnormalities, grade 1 diastolic dysfunction, no significant valvular abnormalities, no source of emboli noted. Unable to place patient on antiplatelets for stroke prophylaxis secondary to thrombocytopenia. PT/OT/ST. Neurology following.  #5 malignant hypertension Secondary to medication noncompliance that patient had not taken his blood pressure medications for least 3 weeks prior to admission. Continue to hold ACE inhibitor secondary to acute renal failure. Nitroglycerin drip has been discontinued. Blood pressure improved. Continue current regimen of Norvasc, clonidine, hydralazine,imdur. Follow and titrate as needed.  #6 tobacco abuse Tobacco cessation. Continue nicotine patch.  #7 elevated troponin EKG on admission showed T-wave inversions in leads V4-V6 and aVL. No ST elevation. Likely secondary to malignant hypertension and acute renal failure. Patient denies any acute chest pain. 2-D echo with a EF of 50-55% with no wall motion abnormalities. Discontinue aspirin as patient with thrombocytopenia and will defer to hematology when aspirin may be started.  #8 hiccups Thorazine as needed.  #9 nausea/vomiting Likely secondary to uremia secondary to acute renal failure. Improved.  #10 hepatitis C antibody  positive Hep C viral load pending. Outpatient follow-up.  DVT prophylaxis: SCDs Code Status: Full Family Communication: Updated patient. No family present. Disposition Plan: Remain the stepdown unit today. Home once acute renal failure has resolved, thrombocytopenia improved, stroke workup completed and per nephrology.   Consultants:   Neurology: Dr. Tressie Ellis 12/12/2016  Nephrology Dr. Joelyn Oms 12/11/2016  Hematology oncology Dr. Lorna Few 12/11/2016  Procedures:   CT 12/12/2016  MRI head 12/12/2016  Abdominal x-ray 12/12/2016  Chest x-ray 12/11/2016, 12/12/2016  Renal ultrasound 12/11/2016  2-D echo 12/12/2016  Plasmapheresis started 12/12/2016  Insertion of hemodialysis catheter, Georgann Housekeeper, NP 12/12/2016    Antimicrobials:   None   Subjective: Patient alert x 4. No SOB. No CP. Patient states he is making urine. Patient states right upper extremity weakness and numbness resolved. No complaints. Patient states he's feeling better.  Objective: Vitals:   12/13/16 0520 12/13/16 0545 12/13/16 0800 12/13/16 0840  BP: (!) 141/77 (!) 145/87 (!) 148/87 (!) 148/87  Pulse:  80 80 81  Resp: 20 12 15 20   Temp: 98 F (36.7 C) 98.1 F (36.7 C)  98.7 F (37.1 C)  TempSrc: Oral Oral  Oral  SpO2: 97% 98% 99% 100%  Weight:  81.4 kg (179 lb 7.3 oz)    Height:        Intake/Output Summary (Last 24 hours) at 12/13/16 0933 Last data filed at 12/13/16 0600  Gross per 24 hour  Intake             1150 ml  Output              700 ml  Net              450 ml   Filed Weights   12/12/16 0500 12/12/16 2120 12/13/16 0545  Weight: 80.1 kg (176 lb 9.4 oz) 81 kg (178 lb 9.2 oz) 81.4 kg (179 lb 7.3 oz)    Examination:  General exam: Appears calm and comfortable  Respiratory system: Clear to auscultation. Respiratory effort normal. Cardiovascular system: S1 & S2 heard, RRR. No JVD, murmurs, rubs, gallops or clicks. No pedal edema. Gastrointestinal system: Abdomen  is nondistended, soft and nontender. No organomegaly or masses felt. Normal bowel sounds heard. Central nervous system: Alert and oriented. No focal neurological deficits. Extremities: Symmetric 5 x 5 power. Skin: No rashes, lesions or ulcers Psychiatry: Judgement and insight appear normal. Mood & affect appropriate.     Data Reviewed: I have personally reviewed following labs and imaging studies  CBC:  Recent Labs Lab 12/10/16 1845 12/11/16 0705 12/12/16 0239 12/13/16 0342 12/13/16 0353  WBC 7.8 8.6 7.5 5.0  --   NEUTROABS 5.6  --   --   --   --   HGB 14.6 13.9 11.6* 11.6* 11.6*  HCT 40.9 39.2 32.4* 33.3* 34.0*  MCV 84.5 85.2 85.0 85.2  --   PLT 55* 71* 74* 107*  --    Basic Metabolic Panel:  Recent Labs Lab 12/10/16 1845 12/11/16 0705 12/12/16 0239 12/13/16 0343 12/13/16 0353  NA 136 134* 132* 128* 129*  K 3.6 3.7 3.6 4.1 4.1  CL 100* 100* 99* 95* 95*  CO2 20* 22 20* 20*  --   GLUCOSE 147* 119* 112* 141* 135*  BUN 93* 97* 102* 107* 114*  CREATININE 8.67* 9.12* 9.78* 10.36* 11.00*  CALCIUM 9.1 9.2 8.5* 8.7*  --   MG 1.9  --   --   --   --   PHOS 4.7* 4.5 4.4 5.5*  --    GFR: Estimated  Creatinine Clearance: 8.5 mL/min (by C-G formula based on SCr of 11 mg/dL (H)). Liver Function Tests:  Recent Labs Lab 12/10/16 1845 12/11/16 0705 12/12/16 0239 12/13/16 0343  AST 41  --   --   --   ALT 30  --   --   --   ALKPHOS 57  --   --   --   BILITOT 1.8*  --   --   --   PROT 6.1*  --   --   --   ALBUMIN 3.5 3.4* 2.8* 2.8*    Recent Labs Lab 12/10/16 1938  LIPASE 33   No results for input(s): AMMONIA in the last 168 hours. Coagulation Profile:  Recent Labs Lab 12/10/16 1845  INR 1.03   Cardiac Enzymes:  Recent Labs Lab 12/10/16 1845 12/10/16 2355 12/11/16 0705  TROPONINI 0.53* 0.50* 0.46*   BNP (last 3 results) No results for input(s): PROBNP in the last 8760 hours. HbA1C:  Recent Labs  12/10/16 1845  HGBA1C 5.2   CBG: No results for  input(s): GLUCAP in the last 168 hours. Lipid Profile: No results for input(s): CHOL, HDL, LDLCALC, TRIG, CHOLHDL, LDLDIRECT in the last 72 hours. Thyroid Function Tests:  Recent Labs  12/10/16 1845  TSH 0.669   Anemia Panel:  Recent Labs  12/10/16 1845 12/10/16 1938  VITAMINB12  --  372  RETICCTPCT 1.5  --    Sepsis Labs:  Recent Labs Lab 12/10/16 1939  LATICACIDVEN 2.5*    Recent Results (from the past 240 hour(s))  MRSA PCR Screening     Status: None   Collection Time: 12/10/16  5:46 PM  Result Value Ref Range Status   MRSA by PCR NEGATIVE NEGATIVE Final    Comment:        The GeneXpert MRSA Assay (FDA approved for NASAL specimens only), is one component of a comprehensive MRSA colonization surveillance program. It is not intended to diagnose MRSA infection nor to guide or monitor treatment for MRSA infections.   Urine culture     Status: None   Collection Time: 12/11/16  5:45 AM  Result Value Ref Range Status   Specimen Description URINE, CATHETERIZED  Final   Special Requests NONE  Final   Culture NO GROWTH  Final   Report Status 12/12/2016 FINAL  Final         Radiology Studies: Dg Chest 2 View  Result Date: 12/11/2016 CLINICAL DATA:  Acute renal failure. No current cardiopulmonary complaints. Smoker, history of hypertension EXAM: CHEST  2 VIEW COMPARISON:  None in PACs FINDINGS: The lungs are adequately inflated and clear. The heart and pulmonary vascularity are normal. There is calcification in the wall of the thoracic aorta. There is no pleural effusion. The mediastinum is normal in width. The bony thorax exhibits no acute abnormality. IMPRESSION: There is acute cardiopulmonary abnormality. Thoracic aortic atherosclerosis. Electronically Signed   By: David  Martinique M.D.   On: 12/11/2016 12:42   Dg Abd 1 View  Result Date: 12/12/2016 CLINICAL DATA:  56 y/o  M; evaluation for metal prior to MRI. EXAM: ABDOMEN - 1 VIEW COMPARISON:  None. FINDINGS:  Punctate metallic fragments project over the left hemi sacrum and there is a 20 mm fragment projecting over the left ilium. Normal bowel gas pattern. Lower lumbar spine degenerative changes. No acute osseous abnormality identified. IMPRESSION: Metal fragments projecting over the pelvis. Electronically Signed   By: Kristine Garbe M.D.   On: 12/12/2016 20:17   Ct Head Wo  Contrast  Result Date: 12/12/2016 CLINICAL DATA:  Acute encephalopathy.  Hypertension EXAM: CT HEAD WITHOUT CONTRAST TECHNIQUE: Contiguous axial images were obtained from the base of the skull through the vertex without intravenous contrast. COMPARISON:  None. FINDINGS: Brain: Ventricle size and cerebral volume normal. Extensive white matter hypodensity diffusely. Brainstem and cerebellum normal. Negative for hemorrhage or mass. No shift of the midline structures. Vascular: No hyperdense vessel or unexpected calcification. Skull: Negative Sinuses/Orbits: Negative Other: None IMPRESSION: Extensive white matter disease bilaterally. No prior studies available however this pattern is most consistent with advanced chronic microvascular ischemia. No acute infarct or hemorrhage. Electronically Signed   By: Franchot Gallo M.D.   On: 12/12/2016 19:47   Mr Brain Wo Contrast  Result Date: 12/12/2016 CLINICAL DATA:  Headache and hypertension.  Right body numbness EXAM: MRI HEAD WITHOUT CONTRAST TECHNIQUE: Multiplanar, multiecho pulse sequences of the brain and surrounding structures were obtained without intravenous contrast. COMPARISON:  CT head 12/12/2016 FINDINGS: Brain: Small linear area of restricted diffusion in the left frontal white matter most consistent with acute infarct. Small area of restricted diffusion in the right parietal white matter also consistent with acute infarct. Extensive hyperintensity throughout the periventricular and deep white matter bilaterally. Hyperintensity in the pons bilaterally extending into the cerebellum  bilaterally. These findings are most likely due to chronic ischemia. Negative for hemorrhage or mass. Ventricle size normal. No shift of the midline structures. Vascular: Normal arterial flow void Skull and upper cervical spine: Negative Sinuses/Orbits: Negative Other: None IMPRESSION: Small areas of restricted diffusion in the left frontal lobe and right parietal lobe most consistent with acute infarction. Extensive white matter changes bilaterally most compatible chronic microvascular ischemia. Hyperintensity in the pons and cerebellum most likely due to chronic ischemia. Given history severe hypertension, PRES also a consideration. Electronically Signed   By: Franchot Gallo M.D.   On: 12/12/2016 21:10   US Renal  Result Date: 12/11/2016 CLINICAL DATA:  Acute renal insufficiency. EXAM: RENAL / URINARY TRACT ULTRASOUND COMPLETE COMPARISON:  None in PACs FINDINGS: Right Kidney: Length: 10.4 cm. The renal cortical echotexture is increased and is greater than that of the adjacent liver. There is no hydronephrosis. Left Kidney: Length: 10.1 cm. The renal cortical echotexture is increased similar to that on the right. There is a lower pole simple appearing cyst measuring 1.2 cm in greatest dimension. There is no hydronephrosis. Bladder: The urinary bladder is decompressed by a Foley catheter. IMPRESSION: Increased renal cortical echotexture consistent with medical renal disease. No hydronephrosis. The urinary bladder is decompressed by a Foley catheter. Electronically Signed   By: David  Martinique M.D.   On: 12/11/2016 13:12   Dg Chest Port 1 View  Result Date: 12/12/2016 CLINICAL DATA:  Central line placement EXAM: PORTABLE CHEST 1 VIEW COMPARISON:  12/11/2016 FINDINGS: New right internal jugular central line tip in the SVC at the azygos level, 6 cm above the right atrium. No pneumothorax. Lungs remain clear. IMPRESSION: Right internal jugular central line tip in the SVC at the azygos level. Electronically Signed    By: Nelson Chimes M.D.   On: 12/12/2016 15:27        Scheduled Meds: .  stroke: mapping our early stages of recovery book   Does not apply Once  . sodium chloride   Intravenous Once  . sodium chloride   Intravenous Once  . amLODipine  10 mg Oral Daily  . aspirin EC  81 mg Oral Daily  . cloNIDine  0.1 mg Oral TID  .  feeding supplement  1 Container Oral TID BM  . folic acid  1 mg Oral Daily  . heparin  2.4 mL Intravenous Once  . hydrALAZINE  50 mg Oral Q8H  . isosorbide mononitrate  60 mg Oral Daily  . nicotine  14 mg Transdermal Q24H  . polyethylene glycol  17 g Oral Daily  . predniSONE  60 mg Oral Q breakfast  . senna-docusate  1 tablet Oral BID  . sodium chloride flush  3 mL Intravenous Q12H   Continuous Infusions:   LOS: 3 days    Time spent: 73 mins    Prichard,Duana Benedict, MD [facility] Pager [phone]  If 7PM-7AM, please contact night-coverage [email or web address] Password [facility] 12/13/2016, 9:33 AM

## 2016-12-14 ENCOUNTER — Inpatient Hospital Stay (HOSPITAL_COMMUNITY): Payer: Medicaid Other

## 2016-12-14 ENCOUNTER — Encounter (HOSPITAL_COMMUNITY): Payer: Medicaid Other

## 2016-12-14 DIAGNOSIS — M311 Thrombotic microangiopathy: Secondary | ICD-10-CM

## 2016-12-14 DIAGNOSIS — R748 Abnormal levels of other serum enzymes: Secondary | ICD-10-CM

## 2016-12-14 DIAGNOSIS — I472 Ventricular tachycardia: Secondary | ICD-10-CM

## 2016-12-14 DIAGNOSIS — R000 Tachycardia, unspecified: Secondary | ICD-10-CM

## 2016-12-14 DIAGNOSIS — G9340 Encephalopathy, unspecified: Secondary | ICD-10-CM

## 2016-12-14 DIAGNOSIS — N19 Unspecified kidney failure: Secondary | ICD-10-CM

## 2016-12-14 LAB — PREPARE FRESH FROZEN PLASMA
BLOOD PRODUCT EXPIRATION DATE: 201802262359
BLOOD PRODUCT EXPIRATION DATE: 201802262359
BLOOD PRODUCT EXPIRATION DATE: 201802262359
Blood Product Expiration Date: 201802242359
Blood Product Expiration Date: 201802262359
Blood Product Expiration Date: 201802262359
Blood Product Expiration Date: 201802262359
Blood Product Expiration Date: 201802262359
Blood Product Expiration Date: 201802262359
Blood Product Expiration Date: 201802262359
Blood Product Expiration Date: 201802262359
Blood Product Expiration Date: 201802262359
Blood Product Expiration Date: 201802262359
ISSUE DATE / TIME: 201802210134
ISSUE DATE / TIME: 201802210134
ISSUE DATE / TIME: 201802210134
ISSUE DATE / TIME: 201802210134
ISSUE DATE / TIME: 201802210134
ISSUE DATE / TIME: 201802210134
ISSUE DATE / TIME: 201802210134
ISSUE DATE / TIME: 201802210134
ISSUE DATE / TIME: 201802210134
ISSUE DATE / TIME: 201802210134
ISSUE DATE / TIME: 201802210134
ISSUE DATE / TIME: 201802210134
ISSUE DATE / TIME: 201802210134
UNIT TYPE AND RH: 5100
UNIT TYPE AND RH: 5100
UNIT TYPE AND RH: 5100
UNIT TYPE AND RH: 9500
UNIT TYPE AND RH: 9500
Unit Type and Rh: 5100
Unit Type and Rh: 5100
Unit Type and Rh: 5100
Unit Type and Rh: 5100
Unit Type and Rh: 5100
Unit Type and Rh: 5100
Unit Type and Rh: 5100
Unit Type and Rh: 9500

## 2016-12-14 LAB — THERAPEUTIC PLASMA EXCHANGE (BLOOD BANK)
BLOOD PRODUCT EXPIRATION DATE: 201802252359
BLOOD PRODUCT EXPIRATION DATE: 201802252359
BLOOD PRODUCT EXPIRATION DATE: 201802262359
BLOOD PRODUCT EXPIRATION DATE: 201802262359
BLOOD PRODUCT EXPIRATION DATE: 201802262359
BLOOD PRODUCT EXPIRATION DATE: 201802262359
Blood Product Expiration Date: 201802252359
Blood Product Expiration Date: 201802252359
Blood Product Expiration Date: 201802262359
Blood Product Expiration Date: 201802262359
Blood Product Expiration Date: 201802262359
Blood Product Expiration Date: 201802262359
Blood Product Expiration Date: 201802262359
ISSUE DATE / TIME: 201802212101
ISSUE DATE / TIME: 201802212101
ISSUE DATE / TIME: 201802212101
ISSUE DATE / TIME: 201802212101
ISSUE DATE / TIME: 201802212101
ISSUE DATE / TIME: 201802212101
ISSUE DATE / TIME: 201802212101
ISSUE DATE / TIME: 201802212101
ISSUE DATE / TIME: 201802212101
ISSUE DATE / TIME: 201802212101
ISSUE DATE / TIME: 201802212101
ISSUE DATE / TIME: 201802212101
ISSUE DATE / TIME: 201802212101
UNIT TYPE AND RH: 5100
UNIT TYPE AND RH: 5100
UNIT TYPE AND RH: 5100
UNIT TYPE AND RH: 5100
UNIT TYPE AND RH: 6200
UNIT TYPE AND RH: 6200
UNIT TYPE AND RH: 9500
Unit Type and Rh: 5100
Unit Type and Rh: 5100
Unit Type and Rh: 5100
Unit Type and Rh: 5100
Unit Type and Rh: 6200
Unit Type and Rh: 6200

## 2016-12-14 LAB — LIPID PANEL
Cholesterol: 137 mg/dL (ref 0–200)
HDL: 46 mg/dL (ref >40)
LDL CALC: 69 mg/dL (ref 0–99)
Total CHOL/HDL Ratio: 3 RATIO
Triglycerides: 112 mg/dL (ref <150)
VLDL: 22 mg/dL (ref 0–40)

## 2016-12-14 LAB — CBC
HEMATOCRIT: 30.7 % — AB (ref 39.0–52.0)
Hemoglobin: 10.8 g/dL — ABNORMAL LOW (ref 13.0–17.0)
MCH: 30 pg (ref 26.0–34.0)
MCHC: 35.2 g/dL (ref 30.0–36.0)
MCV: 85.3 fL (ref 78.0–100.0)
Platelets: 161 10*3/uL (ref 150–400)
RBC: 3.6 MIL/uL — AB (ref 4.22–5.81)
RDW: 14 % (ref 11.5–15.5)
WBC: 10.4 10*3/uL (ref 4.0–10.5)

## 2016-12-14 LAB — BASIC METABOLIC PANEL
Anion gap: 16 — ABNORMAL HIGH (ref 5–15)
BUN: 66 mg/dL — ABNORMAL HIGH (ref 6–20)
CALCIUM: 9.6 mg/dL (ref 8.9–10.3)
CO2: 27 mmol/L (ref 22–32)
CREATININE: 7.2 mg/dL — AB (ref 0.61–1.24)
Chloride: 92 mmol/L — ABNORMAL LOW (ref 101–111)
GFR calc non Af Amer: 8 mL/min — ABNORMAL LOW (ref >60)
GFR, EST AFRICAN AMERICAN: 9 mL/min — AB (ref >60)
Glucose, Bld: 115 mg/dL — ABNORMAL HIGH (ref 65–99)
Potassium: 3.5 mmol/L (ref 3.5–5.1)
SODIUM: 135 mmol/L (ref 135–145)

## 2016-12-14 LAB — HEPATIC FUNCTION PANEL
ALT: 21 U/L (ref 17–63)
AST: 24 U/L (ref 15–41)
Albumin: 3.2 g/dL — ABNORMAL LOW (ref 3.5–5.0)
Alkaline Phosphatase: 53 U/L (ref 38–126)
BILIRUBIN INDIRECT: 0.6 mg/dL (ref 0.3–0.9)
Bilirubin, Direct: 0.2 mg/dL (ref 0.1–0.5)
TOTAL PROTEIN: 5.8 g/dL — AB (ref 6.5–8.1)
Total Bilirubin: 0.8 mg/dL (ref 0.3–1.2)

## 2016-12-14 LAB — HEPATITIS B SURFACE ANTIBODY,QUALITATIVE: HEP B S AB: REACTIVE

## 2016-12-14 LAB — POCT I-STAT, CHEM 8
BUN: 66 mg/dL — ABNORMAL HIGH (ref 6–20)
CREATININE: 8.2 mg/dL — AB (ref 0.61–1.24)
Calcium, Ion: 0.96 mmol/L — ABNORMAL LOW (ref 1.15–1.40)
Chloride: 93 mmol/L — ABNORMAL LOW (ref 101–111)
GLUCOSE: 174 mg/dL — AB (ref 65–99)
HEMATOCRIT: 29 % — AB (ref 39.0–52.0)
Hemoglobin: 9.9 g/dL — ABNORMAL LOW (ref 13.0–17.0)
POTASSIUM: 3.7 mmol/L (ref 3.5–5.1)
Sodium: 131 mmol/L — ABNORMAL LOW (ref 135–145)
TCO2: 25 mmol/L (ref 0–100)

## 2016-12-14 LAB — MAGNESIUM: MAGNESIUM: 2.8 mg/dL — AB (ref 1.7–2.4)

## 2016-12-14 LAB — HEPATITIS B CORE ANTIBODY, TOTAL: Hep B Core Total Ab: POSITIVE — AB

## 2016-12-14 LAB — TROPONIN I: Troponin I: 0.18 ng/mL (ref <0.03)

## 2016-12-14 LAB — T4, FREE: Free T4: 0.91 ng/dL (ref 0.61–1.12)

## 2016-12-14 MED ORDER — SODIUM CHLORIDE 0.9 % IV SOLN
4.0000 g | Freq: Once | INTRAVENOUS | Status: AC
  Administered 2016-12-14: 4 g via INTRAVENOUS
  Filled 2016-12-14: qty 40

## 2016-12-14 MED ORDER — CALCIUM CARBONATE ANTACID 500 MG PO CHEW: 2.0000 | CHEWABLE_TABLET | ORAL | Status: DC

## 2016-12-14 MED ORDER — DIPHENHYDRAMINE HCL 25 MG PO CAPS
25.0000 mg | ORAL_CAPSULE | Freq: Four times a day (QID) | ORAL | Status: DC | PRN
Start: 2016-12-14 — End: 2016-12-14

## 2016-12-14 MED ORDER — SODIUM CHLORIDE 0.9 % IV SOLN: Freq: Once | INTRAVENOUS | Status: DC

## 2016-12-14 MED ORDER — SODIUM CHLORIDE 0.9 % IV SOLN
4.0000 g | Freq: Once | INTRAVENOUS | Status: DC
  Filled 2016-12-14: qty 40

## 2016-12-14 MED ORDER — DIPHENHYDRAMINE HCL 25 MG PO CAPS: 25.0000 mg | ORAL_CAPSULE | Freq: Four times a day (QID) | ORAL | Status: DC | PRN

## 2016-12-14 MED ORDER — ANTICOAGULANT SODIUM CITRATE 4% (200MG/5ML) IV SOLN
5.0000 mL | Freq: Once | Status: DC
  Filled 2016-12-14: qty 250

## 2016-12-14 MED ORDER — ACETAMINOPHEN 325 MG PO TABS: 650.0000 mg | ORAL_TABLET | ORAL | Status: DC | PRN

## 2016-12-14 MED ORDER — CALCIUM CARBONATE ANTACID 500 MG PO CHEW
CHEWABLE_TABLET | ORAL | Status: AC
  Administered 2016-12-14: 400 mg
  Filled 2016-12-14: qty 2

## 2016-12-14 MED ORDER — ACD FORMULA A 0.73-2.45-2.2 GM/100ML VI SOLN
500.0000 mL | Status: DC
  Filled 2016-12-14: qty 500

## 2016-12-14 MED ORDER — ACD FORMULA A 0.73-2.45-2.2 GM/100ML VI SOLN
Status: AC
  Filled 2016-12-14: qty 500

## 2016-12-14 MED ORDER — CALCIUM CARBONATE ANTACID 500 MG PO CHEW
CHEWABLE_TABLET | ORAL | Status: AC
  Filled 2016-12-14: qty 1

## 2016-12-14 MED ORDER — ACD FORMULA A 0.73-2.45-2.2 GM/100ML VI SOLN
500.0000 mL | Status: DC
  Administered 2016-12-14: 500 mL via INTRAVENOUS
  Filled 2016-12-14: qty 500

## 2016-12-14 MED ORDER — ANTICOAGULANT SODIUM CITRATE 4% (200MG/5ML) IV SOLN
5.0000 mL | Freq: Once | Status: AC
  Administered 2016-12-14: 5 mL
  Filled 2016-12-14: qty 250

## 2016-12-14 NOTE — Progress Notes (Signed)
Removed pt IV's around 1945. Educated pt on the importance of staying in the hospital. Pt stated " I have money to make" . Pt up walking around the room. HD removal site has small amount of drainage. Reminded pt he could leave around 805 because he had to lay flat for an hour post HD cath removal. Pt also reminded staff has to escort him out. Pt got back in the bed. At 805 pt in hallway with friend walking halfway down the hall to the exit. Pt stopped and escort out to ED parking lot. Pt has all belongings.

## 2016-12-14 NOTE — Progress Notes (Signed)
Pt has signed [facility] paperwork, HD cath removed per MD order/ [facility] Silos and updated, pt re-educated on importance of not leaving AMA. Pt stated" you don't know how important this time of year is for me."

## 2016-12-14 NOTE — Progress Notes (Signed)
OT Cancellation Note  Patient Details Name: Frank Fuller MRN: [id number] DOB: Oct 01, 1961   Cancelled Treatment:    Reason Eval/Treat Not Completed: Patient at procedure or test/ unavailable. Spoke to pt's RN and she states pt is in [place] now. Will re-attempt eval tomorrow  Almon Register [phone] 12/14/2016, 3:10 PM

## 2016-12-14 NOTE — Progress Notes (Signed)
PT Cancellation Note  Patient Details Name: Frank Fuller MRN: [id number] DOB: [birth year]   Cancelled Treatment:    Reason Eval/Treat Not Completed: Patient at procedure or test/unavailable. Pt off of the floor for dialysis. PT will continue to f/u with pt as appropriate and available.    [place] 12/14/2016, 3:46 PM

## 2016-12-14 NOTE — Progress Notes (Signed)
SLP Cancellation Note  Patient Details Name: Frank Fuller MRN: [id number] DOB: 01/30/1961   Cancelled treatment:       Reason Eval/Treat Not Completed: Patient at procedure or test/unavailable   Nahome Bublitz, Katherene Ponto 12/14/2016, 2:12 PM

## 2016-12-14 NOTE — Progress Notes (Signed)
PROGRESS NOTE    Frank Fuller  SNK:[id number] DOB: [birth year] DOA: 12/10/2016 PCP: No primary care provider on file.    Brief Narrative:  Pt. with PMH of hypertension; admitted on 12/10/2016, with complaint of nausea vomiting, hiccups, generalized malaise, was found to have acute kidney injury and thrombocytopenia. Nephrology consulted who in turn has consulted hematology at present. Low haptoglobin, hematology recommended plasmapheresis, CCM with insert HD catheter. Currently further plan is plasmapheresis and monitor for renal recovery. Patient subsequently went into SVT with a 36 beat run was seen in consultation by cardiology and started on a beta blocker.   Assessment & Plan:   Principal Problem:   ARF (acute renal failure) (HCC) Active Problems:   Thrombocytopenia (HCC)   Elevated troponin   Malignant hypertension   Hiccups   Elevated bilirubin   Nausea with vomiting   Uremia   Thrombotic microangiopathy (HCC)   CVA (cerebral vascular accident) ([place])   Hyponatremia   SVT (supraventricular tachycardia) ([place])  #1 acute renal failure ?? Etiology. Patient presented with malignant hypertension in the setting of thrombocytopenia. 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 Patient noted to present with acute renal failure, thrombocytopenia, hemolytic anemia with low haptoglobin, elevated LDH, elevated bilirubin, altered mental status and now with an acute CVA noted on MRI of the head 12/12/2016. ANA antibody was negative. Anchor proteinase 3 less than 3.5. GBM antibodies was 3. C-ANCA was less than 1:20, p-ANCA <1:20, C3 of 86, C4 of 21.ADAMTS 13 activity 62.7 and slightly decreased. pending. Hep B antibody nonreactive on 12/11/2016 however repeat hep B surface antibody reactive on 12/13/2016. Hep B core antibody positive. Hep B surface antigen has been negative. HIV nonreactive. Hep C viral antibody greater than 11.0. HD catheter placed 12/12/2016 per P CCM and patient started on TPE on  12/12/2016 for presumed TTP +/- HD as needed. Per nephrology.  #2 thrombocytopenia with suspicion for TTP/HUS Patient had presented with acute renal failure, thrombocytopenia, hemolytic anemia with low haptoglobin, elevated LDH with some associated altered mental status. Mental status has improved. Thrombocytopenia improving with TPE. Renal function check him back down after hemodialysis on 12/13/2016. Platelet count has also improved and currently at 161,000. Hematology and nephrology following.  #3 hyponatremia Likely secondary to problem #1. Improved with hemodialysis. Follow.  #4 acute CVA MRI of the brain 12/12/2016 showing small areas of restricted diffusion the left frontal lobe and right parietal lobe was consistent with acute infarction. Patient was noted on 12/12/2016 to have some slurred speech and right upper extremity weakness and numbness which has since resolved. CT head negative. Stroke workup underway with carotid Dopplers, MRA of the head and neck negative for any significant stenosis. Fasting lipid panel with LDL of 69, hemoglobin A1c = 5.2. 2-D echo which was done with a EF of 50-55%, no wall motion abnormalities, grade 1 diastolic dysfunction, no significant valvular abnormalities, no source of emboli noted. Unable to place patient on antiplatelets for stroke prophylaxis secondary to thrombocytopenia. PT/OT/ST. Neurology following.  #5 malignant hypertension Secondary to medication noncompliance that patient had not taken his blood pressure medications for least 3 weeks prior to admission. Continue to hold ACE inhibitor secondary to acute renal failure. Nitroglycerin drip has been discontinued. Blood pressure improved. Continue current regimen of Norvasc, clonidine, hydralazine,imdur. Follow and titrate as needed.  #6 SVT Patient noted to have a 36 beat run of wide complex tachycardia felt likely secondary to SVT. TSH done was slightly decreased however free T4 within normal  limits.  Cardiac enzymes mildly elevated however felt to be secondary to acute renal failure and malignant hypertension. 2-D echo with normal left ventricular systolic function. Patient was started on metoprolol 25 mg twice daily per cardiology recommendations with no further SVT noted on telemetry. Cardiology following and appreciate input and recommendations.  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 Patient currently asymptomatic. Monitor closely while on metoprolol. Per cardiology.  #8 elevated troponin EKG on admission showed T-wave inversions in leads V4-V6 and aVL. No ST elevation. Likely secondary to malignant hypertension and acute renal failure. Patient denies any acute chest pain. 2-D echo with a EF of 50-55% with no wall motion abnormalities. Discontinued aspirin as patient with thrombocytopenia and will defer to hematology when aspirin may be started. Repeat troponins were done on 12/13/2016 secondary to patient going into SVT and mildly elevated felt likely secondary to malignant hypertension and acute renal failure and not acute coronary syndrome.  #9 hiccups Thorazine as needed.  #10 nausea/vomiting Likely secondary to uremia secondary to acute renal failure. Improved.  #11 hepatitis C antibody positive Hep C viral load pending. Outpatient follow-up.  #12 tobacco abuse Tobacco cessation. Continue nicotine patch.    DVT prophylaxis: SCDs Code Status: Full Family Communication: Updated patient. No family present. Disposition Plan: Remain the stepdown unit today. Home once acute renal failure has resolved, thrombocytopenia improved, stroke workup completed and per nephrology.   Consultants:   Neurology: Dr. Tressie Ellis 12/12/2016  Nephrology Dr. Joelyn Oms 12/11/2016  Hematology oncology Dr. Lorna Few 12/11/2016  Cardiology: Dr. Burt Knack 12/13/2016  Procedures:   CT 12/12/2016  MRI head 12/12/2016  Abdominal x-ray 12/12/2016  Chest x-ray  12/11/2016, 12/12/2016  Renal ultrasound 12/11/2016  2-D echo 12/12/2016  Plasmapheresis started 12/12/2016  Insertion of hemodialysis catheter, Georgann Housekeeper, NP 12/12/2016  Hemodialysis 12/13/2016  Antimicrobials:   None   Subjective: Patient alert x 4. No SOB. No CP. Patient states he is making urine. Patient states right upper extremity weakness and numbness resolved. Patient states he's feeling much better.  Objective: Vitals:   12/14/16 0624 12/14/16 0700 12/14/16 0800 12/14/16 0830  BP: (!) 149/92 (!) 158/94 (!) 147/100 (!) 147/92  Pulse:  70 78 84  Resp:  18 16 18   Temp:    98.5 F (36.9 C)  TempSrc:    Oral  SpO2:    97%  Weight:      Height:        Intake/Output Summary (Last 24 hours) at 12/14/16 0931 Last data filed at 12/14/16 0831  Gross per 24 hour  Intake              820 ml  Output             1052 ml  Net             -232 ml   Filed Weights   12/13/16 0545 12/13/16 1000 12/14/16 0500  Weight: 81.4 kg (179 lb 7.3 oz) 82.5 kg (181 lb 14.1 oz) 82.9 kg (182 lb 12.2 oz)    Examination:  General exam: Appears calm and comfortable  Respiratory system: Clear to auscultation. Respiratory effort normal. Cardiovascular system: S1 & S2 heard, RRR. No JVD, murmurs, rubs, gallops or clicks. No pedal edema. Gastrointestinal system: Abdomen is nondistended, soft and nontender. No organomegaly or masses felt. Normal bowel sounds heard. Central nervous system: Alert and oriented. No focal neurological deficits. Extremities: Symmetric 5 x 5 power. Skin: No rashes, lesions or ulcers Psychiatry: Judgement and insight appear  normal. Mood & affect appropriate.     Data Reviewed: I have personally reviewed following labs and imaging studies  CBC:  Recent Labs Lab 12/10/16 1845 12/11/16 0705 12/12/16 0239 12/13/16 0342 12/13/16 0353 12/13/16 2226 12/14/16 0228  WBC 7.8 8.6 7.5 5.0  --   --  10.4  NEUTROABS 5.6  --   --   --   --   --   --   HGB 14.6  13.9 11.6* 11.6* 11.6* 9.9* 10.8*  HCT 40.9 39.2 32.4* 33.3* 34.0* 29.0* 30.7*  MCV 84.5 85.2 85.0 85.2  --   --  85.3  PLT 55* 71* 74* 107*  --   --  563   Basic Metabolic Panel:  Recent Labs Lab 12/10/16 1845 12/11/16 0705 12/12/16 0239 12/13/16 0343 12/13/16 0353 12/13/16 1509 12/13/16 2226 12/14/16 0228  NA 136 134* 132* 128* 129*  --  131* 135  K 3.6 3.7 3.6 4.1 4.1  --  3.7 3.5  CL 100* 100* 99* 95* 95*  --  93* 92*  CO2 20* 22 20* 20*  --   --   --  27  GLUCOSE 147* 119* 112* 141* 135*  --  174* 115*  BUN 93* 97* 102* 107* 114*  --  66* 66*  CREATININE 8.67* 9.12* 9.78* 10.36* 11.00*  --  8.20* 7.20*  CALCIUM 9.1 9.2 8.5* 8.7*  --   --   --  9.6  MG 1.9  --   --   --   --  1.7  --  2.8*  PHOS 4.7* 4.5 4.4 5.5*  --   --   --   --    GFR: Estimated Creatinine Clearance: 13.3 mL/min (by C-G formula based on SCr of 7.2 mg/dL (H)). Liver Function Tests:  Recent Labs Lab 12/10/16 1845 12/11/16 0705 12/12/16 0239 12/13/16 0343 12/14/16 0228  AST 41  --   --   --  24  ALT 30  --   --   --  21  ALKPHOS 57  --   --   --  53  BILITOT 1.8*  --   --   --  0.8  PROT 6.1*  --   --   --  5.8*  ALBUMIN 3.5 3.4* 2.8* 2.8* 3.2*    Recent Labs Lab 12/10/16 1938  LIPASE 33   No results for input(s): AMMONIA in the last 168 hours. Coagulation Profile:  Recent Labs Lab 12/10/16 1845  INR 1.03   Cardiac Enzymes:  Recent Labs Lab 12/10/16 2355 12/11/16 0705 12/13/16 1509 12/13/16 2039 12/14/16 0228  TROPONINI 0.50* 0.46* 0.12* 0.20* 0.18*   BNP (last 3 results) No results for input(s): PROBNP in the last 8760 hours. HbA1C: No results for input(s): HGBA1C in the last 72 hours. CBG:  Recent Labs Lab 12/13/16 1317  GLUCAP 169*   Lipid Profile:  Recent Labs  12/14/16 0228  CHOL 137  HDL 46  LDLCALC 69  TRIG 112  CHOLHDL 3.0   Thyroid Function Tests:  Recent Labs  12/13/16 1508 12/14/16 0228  TSH 0.301*  --   FREET4  --  0.91   Anemia  Panel: No results for input(s): VITAMINB12, FOLATE, FERRITIN, TIBC, IRON, RETICCTPCT in the last 72 hours. Sepsis Labs:  Recent Labs Lab 12/10/16 1939  LATICACIDVEN 2.5*    Recent Results (from the past 240 hour(s))  MRSA PCR Screening     Status: None   Collection Time: 12/10/16  5:46 PM  Result  Value Ref Range Status   MRSA by PCR NEGATIVE NEGATIVE Final    Comment:        The GeneXpert MRSA Assay (FDA approved for NASAL specimens only), is one component of a comprehensive MRSA colonization surveillance program. It is not intended to diagnose MRSA infection nor to guide or monitor treatment for MRSA infections.   Urine culture     Status: None   Collection Time: 12/11/16  5:45 AM  Result Value Ref Range Status   Specimen Description URINE, CATHETERIZED  Final   Special Requests NONE  Final   Culture NO GROWTH  Final   Report Status 12/12/2016 FINAL  Final         Radiology Studies: Dg Abd 1 View  Result Date: 12/12/2016 CLINICAL DATA:  56 y/o  M; evaluation for metal prior to MRI. EXAM: ABDOMEN - 1 VIEW COMPARISON:  None. FINDINGS: Punctate metallic fragments project over the left hemi sacrum and there is a 20 mm fragment projecting over the left ilium. Normal bowel gas pattern. Lower lumbar spine degenerative changes. No acute osseous abnormality identified. IMPRESSION: Metal fragments projecting over the pelvis. Electronically Signed   By: Kristine Garbe M.D.   On: 12/12/2016 20:17   Ct Head Wo Contrast  Result Date: 12/12/2016 CLINICAL DATA:  Acute encephalopathy.  Hypertension EXAM: CT HEAD WITHOUT CONTRAST TECHNIQUE: Contiguous axial images were obtained from the base of the skull through the vertex without intravenous contrast. COMPARISON:  None. FINDINGS: Brain: Ventricle size and cerebral volume normal. Extensive white matter hypodensity diffusely. Brainstem and cerebellum normal. Negative for hemorrhage or mass. No shift of the midline structures.  Vascular: No hyperdense vessel or unexpected calcification. Skull: Negative Sinuses/Orbits: Negative Other: None IMPRESSION: Extensive white matter disease bilaterally. No prior studies available however this pattern is most consistent with advanced chronic microvascular ischemia. No acute infarct or hemorrhage. Electronically Signed   By: Franchot Gallo M.D.   On: 12/12/2016 19:47   Mr Jodene Nam Neck Wo Contrast  Result Date: 12/14/2016 CLINICAL DATA:  CVA EXAM: MRA NECK WITHOUT CONTRAST MRA HEAD WITHOUT CONTRAST TECHNIQUE: Multiplanar and multiecho pulse sequences of the neck were obtained without intravenous contrast. Angiographic images of the neck were obtained using MRA technique without intravenous contrast.; Angiographic images of the Circle of Willis were obtained using MRA technique without intravenous contrast. COMPARISON:  Brain MRI 12/12/2016 FINDINGS: MRA NECK FINDINGS There is a normal variant aortic arch branching pattern with the brachiocephalic and left common carotid arteries sharing a common origin. The proximal arch vessels are normal. Both carotid bifurcations are normal. There is no evidence of dissection, aneurysm or hemodynamically significant stenosis of the internal carotid arteries. Apparent narrowing of the proximal left ICA petrous segment is favored to be artifactual, as this is not confirmed on the time-of-flight sequence of the head. Both vertebral artery origins are normal. The vertebral system is left dominant. Both vertebral arteries are normal to their confluence at the basilar artery. MRA HEAD FINDINGS Intracranial internal carotid arteries: There is fusiform dilatation of the communicating segment of the right internal carotid artery. The left ICA is normal. Anterior cerebral arteries: Normal. Middle cerebral arteries: Normal. Posterior communicating arteries: Present on the right. Posterior cerebral arteries: Fetal predominant origin of the right posterior cerebral artery.  Otherwise normal. Basilar artery: Normal. Vertebral arteries: Left dominant. Normal. Superior cerebellar arteries: Normal. Anterior inferior cerebellar arteries: Normal. Posterior inferior cerebellar arteries: Normal. IMPRESSION: 1. No intracranial arterial occlusion or high-grade stenosis. 2. Mildly dilated appearance of  the communicating segment of the right internal carotid artery on the brain time-of-flight sequence may indicate carotid ectasia. However, the appearance is likely at least partially artifactual, as it is not reproduced on the intracranial portion of the MRA neck acquisition. 3. No cervical internal carotid or vertebral artery abnormality. Electronically Signed   By: Ulyses Jarred M.D.   On: 12/14/2016 05:46   Mr Brain Wo Contrast  Result Date: 12/12/2016 CLINICAL DATA:  Headache and hypertension.  Right body numbness EXAM: MRI HEAD WITHOUT CONTRAST TECHNIQUE: Multiplanar, multiecho pulse sequences of the brain and surrounding structures were obtained without intravenous contrast. COMPARISON:  CT head 12/12/2016 FINDINGS: Brain: Small linear area of restricted diffusion in the left frontal white matter most consistent with acute infarct. Small area of restricted diffusion in the right parietal white matter also consistent with acute infarct. Extensive hyperintensity throughout the periventricular and deep white matter bilaterally. Hyperintensity in the pons bilaterally extending into the cerebellum bilaterally. These findings are most likely due to chronic ischemia. Negative for hemorrhage or mass. Ventricle size normal. No shift of the midline structures. Vascular: Normal arterial flow void Skull and upper cervical spine: Negative Sinuses/Orbits: Negative Other: None IMPRESSION: Small areas of restricted diffusion in the left frontal lobe and right parietal lobe most consistent with acute infarction. Extensive white matter changes bilaterally most compatible chronic microvascular ischemia.  Hyperintensity in the pons and cerebellum most likely due to chronic ischemia. Given history severe hypertension, PRES also a consideration. Electronically Signed   By: Franchot Gallo M.D.   On: 12/12/2016 21:10   Dg Chest Port 1 View  Result Date: 12/12/2016 CLINICAL DATA:  Central line placement EXAM: PORTABLE CHEST 1 VIEW COMPARISON:  12/11/2016 FINDINGS: New right internal jugular central line tip in the SVC at the azygos level, 6 cm above the right atrium. No pneumothorax. Lungs remain clear. IMPRESSION: Right internal jugular central line tip in the SVC at the azygos level. Electronically Signed   By: Nelson Chimes M.D.   On: 12/12/2016 15:27   Mr Jodene Nam Head/brain RJ Cm  Result Date: 12/14/2016 CLINICAL DATA:  CVA EXAM: MRA NECK WITHOUT CONTRAST MRA HEAD WITHOUT CONTRAST TECHNIQUE: Multiplanar and multiecho pulse sequences of the neck were obtained without intravenous contrast. Angiographic images of the neck were obtained using MRA technique without intravenous contrast.; Angiographic images of the Circle of Willis were obtained using MRA technique without intravenous contrast. COMPARISON:  Brain MRI 12/12/2016 FINDINGS: MRA NECK FINDINGS There is a normal variant aortic arch branching pattern with the brachiocephalic and left common carotid arteries sharing a common origin. The proximal arch vessels are normal. Both carotid bifurcations are normal. There is no evidence of dissection, aneurysm or hemodynamically significant stenosis of the internal carotid arteries. Apparent narrowing of the proximal left ICA petrous segment is favored to be artifactual, as this is not confirmed on the time-of-flight sequence of the head. Both vertebral artery origins are normal. The vertebral system is left dominant. Both vertebral arteries are normal to their confluence at the basilar artery. MRA HEAD FINDINGS Intracranial internal carotid arteries: There is fusiform dilatation of the communicating segment of the right  internal carotid artery. The left ICA is normal. Anterior cerebral arteries: Normal. Middle cerebral arteries: Normal. Posterior communicating arteries: Present on the right. Posterior cerebral arteries: Fetal predominant origin of the right posterior cerebral artery. Otherwise normal. Basilar artery: Normal. Vertebral arteries: Left dominant. Normal. Superior cerebellar arteries: Normal. Anterior inferior cerebellar arteries: Normal. Posterior inferior cerebellar arteries: Normal. IMPRESSION: 1.  No intracranial arterial occlusion or high-grade stenosis. 2. Mildly dilated appearance of the communicating segment of the right internal carotid artery on the brain time-of-flight sequence may indicate carotid ectasia. However, the appearance is likely at least partially artifactual, as it is not reproduced on the intracranial portion of the MRA neck acquisition. 3. No cervical internal carotid or vertebral artery abnormality. Electronically Signed   By: Ulyses Jarred M.D.   On: 12/14/2016 05:46        Scheduled Meds: .  stroke: mapping our early stages of recovery book   Does not apply Once  . sodium chloride   Intravenous Once  . sodium chloride   Intravenous Once  . amLODipine  10 mg Oral Daily  . anticoagulant sodium citrate  5 mL Intracatheter Once  . calcium gluconate IVPB  4 g Intravenous Once  . cloNIDine  0.1 mg Oral TID  . diphenhydrAMINE      . feeding supplement  1 Container Oral TID BM  . folic acid  1 mg Oral Daily  . heparin  2.4 mL Intravenous Once  . hydrALAZINE  50 mg Oral Q8H  . metoprolol tartrate  25 mg Oral BID  . nicotine  14 mg Transdermal Q24H  . polyethylene glycol  17 g Oral Daily  . predniSONE  60 mg Oral Q breakfast  . senna-docusate  1 tablet Oral BID  . sodium chloride flush  3 mL Intravenous Q12H   Continuous Infusions: . citrate dextrose       LOS: 4 days    Time spent: 65 mins    Dewilde,DANIEL, MD [facility] Pager [phone]  If  7PM-7AM, please contact night-coverage [email or web address] Password [facility] 12/14/2016, 9:31 AM

## 2016-12-14 NOTE — Progress Notes (Signed)
Patient ID: Frank Fuller, male   DOB: 07-02-1961, 56 y.o.   MRN: [id number] S:No complaints, vague about the issue during HD yesterday and is dressed as if ready to go home. O:BP (!) 149/92   Pulse 72   Temp 97.9 F (36.6 C) (Oral)   Resp 14   Ht 6\' 3"  (1.905 m)   Wt 82.9 kg (182 lb 12.2 oz)   SpO2 99%   BMI 22.84 kg/m   Intake/Output Summary (Last 24 hours) at 12/14/16 0818 Last data filed at 12/14/16 0421  Gross per 24 hour  Intake              820 ml  Output              902 ml  Net              -82 ml   Intake/Output: I/O last 3 completed shifts: In: 940 [P.O.:840; IV Piggyback:100] Out: 1102 [Urine:875; Other:227]  Intake/Output this shift:  No intake/output data recorded. Weight change: 1.5 kg (3 lb 4.9 oz) Gen:WD AAM in NAD CVS:no rub Resp:cta ONG:EXBMWU Ext:no edema   Recent Labs Lab 12/10/16 1845 12/11/16 0705 12/12/16 0239 12/13/16 0343 12/13/16 0353 12/14/16 0228  NA 136 134* 132* 128* 129* 135  K 3.6 3.7 3.6 4.1 4.1 3.5  CL 100* 100* 99* 95* 95* 92*  CO2 20* 22 20* 20*  --  27  GLUCOSE 147* 119* 112* 141* 135* 115*  BUN 93* 97* 102* 107* 114* 66*  CREATININE 8.67* 9.12* 9.78* 10.36* 11.00* 7.20*  ALBUMIN 3.5 3.4* 2.8* 2.8*  --  3.2*  CALCIUM 9.1 9.2 8.5* 8.7*  --  9.6  PHOS 4.7* 4.5 4.4 5.5*  --   --   AST 41  --   --   --   --  24  ALT 30  --   --   --   --  21   Liver Function Tests:  Recent Labs Lab 12/10/16 1845  12/12/16 0239 12/13/16 0343 12/14/16 0228  AST 41  --   --   --  24  ALT 30  --   --   --  21  ALKPHOS 57  --   --   --  53  BILITOT 1.8*  --   --   --  0.8  PROT 6.1*  --   --   --  5.8*  ALBUMIN 3.5  < > 2.8* 2.8* 3.2*  < > = values in this interval not displayed.  Recent Labs Lab 12/10/16 1938  LIPASE 33   No results for input(s): AMMONIA in the last 168 hours. CBC:  Recent Labs Lab 12/10/16 1845 12/11/16 0705 12/12/16 0239 12/13/16 0342 12/13/16 0353 12/14/16 0228  WBC 7.8 8.6 7.5 5.0  --  10.4   NEUTROABS 5.6  --   --   --   --   --   HGB 14.6 13.9 11.6* 11.6* 11.6* 10.8*  HCT 40.9 39.2 32.4* 33.3* 34.0* 30.7*  MCV 84.5 85.2 85.0 85.2  --  85.3  PLT 55* 71* 74* 107*  --  161   Cardiac Enzymes:  Recent Labs Lab 12/10/16 2355 12/11/16 0705 12/13/16 1509 12/13/16 2039 12/14/16 0228  TROPONINI 0.50* 0.46* 0.12* 0.20* 0.18*   CBG:  Recent Labs Lab 12/13/16 1317  GLUCAP 169*    Iron Studies: No results for input(s): IRON, TIBC, TRANSFERRIN, FERRITIN in the last 72 hours. Studies/Results: Dg Abd 1 View  Result Date:  12/12/2016 CLINICAL DATA:  56 y/o  M; evaluation for metal prior to MRI. EXAM: ABDOMEN - 1 VIEW COMPARISON:  None. FINDINGS: Punctate metallic fragments project over the left hemi sacrum and there is a 20 mm fragment projecting over the left ilium. Normal bowel gas pattern. Lower lumbar spine degenerative changes. No acute osseous abnormality identified. IMPRESSION: Metal fragments projecting over the pelvis. Electronically Signed   By: Kristine Garbe M.D.   On: 12/12/2016 20:17   Ct Head Wo Contrast  Result Date: 12/12/2016 CLINICAL DATA:  Acute encephalopathy.  Hypertension EXAM: CT HEAD WITHOUT CONTRAST TECHNIQUE: Contiguous axial images were obtained from the base of the skull through the vertex without intravenous contrast. COMPARISON:  None. FINDINGS: Brain: Ventricle size and cerebral volume normal. Extensive white matter hypodensity diffusely. Brainstem and cerebellum normal. Negative for hemorrhage or mass. No shift of the midline structures. Vascular: No hyperdense vessel or unexpected calcification. Skull: Negative Sinuses/Orbits: Negative Other: None IMPRESSION: Extensive white matter disease bilaterally. No prior studies available however this pattern is most consistent with advanced chronic microvascular ischemia. No acute infarct or hemorrhage. Electronically Signed   By: Franchot Gallo M.D.   On: 12/12/2016 19:47   Mr Jodene Nam Neck Wo  Contrast  Result Date: 12/14/2016 CLINICAL DATA:  CVA EXAM: MRA NECK WITHOUT CONTRAST MRA HEAD WITHOUT CONTRAST TECHNIQUE: Multiplanar and multiecho pulse sequences of the neck were obtained without intravenous contrast. Angiographic images of the neck were obtained using MRA technique without intravenous contrast.; Angiographic images of the Circle of Willis were obtained using MRA technique without intravenous contrast. COMPARISON:  Brain MRI 12/12/2016 FINDINGS: MRA NECK FINDINGS There is a normal variant aortic arch branching pattern with the brachiocephalic and left common carotid arteries sharing a common origin. The proximal arch vessels are normal. Both carotid bifurcations are normal. There is no evidence of dissection, aneurysm or hemodynamically significant stenosis of the internal carotid arteries. Apparent narrowing of the proximal left ICA petrous segment is favored to be artifactual, as this is not confirmed on the time-of-flight sequence of the head. Both vertebral artery origins are normal. The vertebral system is left dominant. Both vertebral arteries are normal to their confluence at the basilar artery. MRA HEAD FINDINGS Intracranial internal carotid arteries: There is fusiform dilatation of the communicating segment of the right internal carotid artery. The left ICA is normal. Anterior cerebral arteries: Normal. Middle cerebral arteries: Normal. Posterior communicating arteries: Present on the right. Posterior cerebral arteries: Fetal predominant origin of the right posterior cerebral artery. Otherwise normal. Basilar artery: Normal. Vertebral arteries: Left dominant. Normal. Superior cerebellar arteries: Normal. Anterior inferior cerebellar arteries: Normal. Posterior inferior cerebellar arteries: Normal. IMPRESSION: 1. No intracranial arterial occlusion or high-grade stenosis. 2. Mildly dilated appearance of the communicating segment of the right internal carotid artery on the brain  time-of-flight sequence may indicate carotid ectasia. However, the appearance is likely at least partially artifactual, as it is not reproduced on the intracranial portion of the MRA neck acquisition. 3. No cervical internal carotid or vertebral artery abnormality. Electronically Signed   By: Ulyses Jarred M.D.   On: 12/14/2016 05:46   Mr Brain Wo Contrast  Result Date: 12/12/2016 CLINICAL DATA:  Headache and hypertension.  Right body numbness EXAM: MRI HEAD WITHOUT CONTRAST TECHNIQUE: Multiplanar, multiecho pulse sequences of the brain and surrounding structures were obtained without intravenous contrast. COMPARISON:  CT head 12/12/2016 FINDINGS: Brain: Small linear area of restricted diffusion in the left frontal white matter most consistent with acute infarct. Small area  of restricted diffusion in the right parietal white matter also consistent with acute infarct. Extensive hyperintensity throughout the periventricular and deep white matter bilaterally. Hyperintensity in the pons bilaterally extending into the cerebellum bilaterally. These findings are most likely due to chronic ischemia. Negative for hemorrhage or mass. Ventricle size normal. No shift of the midline structures. Vascular: Normal arterial flow void Skull and upper cervical spine: Negative Sinuses/Orbits: Negative Other: None IMPRESSION: Small areas of restricted diffusion in the left frontal lobe and right parietal lobe most consistent with acute infarction. Extensive white matter changes bilaterally most compatible chronic microvascular ischemia. Hyperintensity in the pons and cerebellum most likely due to chronic ischemia. Given history severe hypertension, PRES also a consideration. Electronically Signed   By: Franchot Gallo M.D.   On: 12/12/2016 21:10   Dg Chest Port 1 View  Result Date: 12/12/2016 CLINICAL DATA:  Central line placement EXAM: PORTABLE CHEST 1 VIEW COMPARISON:  12/11/2016 FINDINGS: New right internal jugular central  line tip in the SVC at the azygos level, 6 cm above the right atrium. No pneumothorax. Lungs remain clear. IMPRESSION: Right internal jugular central line tip in the SVC at the azygos level. Electronically Signed   By: Nelson Chimes M.D.   On: 12/12/2016 15:27   Mr Jodene Nam Head/brain VP Cm  Result Date: 12/14/2016 CLINICAL DATA:  CVA EXAM: MRA NECK WITHOUT CONTRAST MRA HEAD WITHOUT CONTRAST TECHNIQUE: Multiplanar and multiecho pulse sequences of the neck were obtained without intravenous contrast. Angiographic images of the neck were obtained using MRA technique without intravenous contrast.; Angiographic images of the Circle of Willis were obtained using MRA technique without intravenous contrast. COMPARISON:  Brain MRI 12/12/2016 FINDINGS: MRA NECK FINDINGS There is a normal variant aortic arch branching pattern with the brachiocephalic and left common carotid arteries sharing a common origin. The proximal arch vessels are normal. Both carotid bifurcations are normal. There is no evidence of dissection, aneurysm or hemodynamically significant stenosis of the internal carotid arteries. Apparent narrowing of the proximal left ICA petrous segment is favored to be artifactual, as this is not confirmed on the time-of-flight sequence of the head. Both vertebral artery origins are normal. The vertebral system is left dominant. Both vertebral arteries are normal to their confluence at the basilar artery. MRA HEAD FINDINGS Intracranial internal carotid arteries: There is fusiform dilatation of the communicating segment of the right internal carotid artery. The left ICA is normal. Anterior cerebral arteries: Normal. Middle cerebral arteries: Normal. Posterior communicating arteries: Present on the right. Posterior cerebral arteries: Fetal predominant origin of the right posterior cerebral artery. Otherwise normal. Basilar artery: Normal. Vertebral arteries: Left dominant. Normal. Superior cerebellar arteries: Normal.  Anterior inferior cerebellar arteries: Normal. Posterior inferior cerebellar arteries: Normal. IMPRESSION: 1. No intracranial arterial occlusion or high-grade stenosis. 2. Mildly dilated appearance of the communicating segment of the right internal carotid artery on the brain time-of-flight sequence may indicate carotid ectasia. However, the appearance is likely at least partially artifactual, as it is not reproduced on the intracranial portion of the MRA neck acquisition. 3. No cervical internal carotid or vertebral artery abnormality. Electronically Signed   By: Ulyses Jarred M.D.   On: 12/14/2016 05:46   .  stroke: mapping our early stages of recovery book   Does not apply Once  . sodium chloride   Intravenous Once  . sodium chloride   Intravenous Once  . amLODipine  10 mg Oral Daily  . anticoagulant sodium citrate  5 mL Intracatheter Once  . calcium  gluconate IVPB  4 g Intravenous Once  . citrate dextrose      . cloNIDine  0.1 mg Oral TID  . diphenhydrAMINE      . feeding supplement  1 Container Oral TID BM  . folic acid  1 mg Oral Daily  . heparin  2.4 mL Intravenous Once  . hydrALAZINE  50 mg Oral Q8H  . metoprolol tartrate  25 mg Oral BID  . nicotine  14 mg Transdermal Q24H  . polyethylene glycol  17 g Oral Daily  . predniSONE  60 mg Oral Q breakfast  . senna-docusate  1 tablet Oral BID  . sodium chloride flush  3 mL Intravenous Q12H    BMET    Component Value Date/Time   NA 135 12/14/2016 0228   K 3.5 12/14/2016 0228   CL 92 (L) 12/14/2016 0228   CO2 27 12/14/2016 0228   GLUCOSE 115 (H) 12/14/2016 0228   BUN 66 (H) 12/14/2016 0228   CREATININE 7.20 (H) 12/14/2016 0228   CALCIUM 9.6 12/14/2016 0228   GFRNONAA 8 (L) 12/14/2016 0228   GFRAA 9 (L) 12/14/2016 0228   CBC    Component Value Date/Time   WBC 10.4 12/14/2016 0228   RBC 3.60 (L) 12/14/2016 0228   HGB 10.8 (L) 12/14/2016 0228   HCT 30.7 (L) 12/14/2016 0228   PLT 161 12/14/2016 0228   MCV 85.3 12/14/2016 0228    MCH 30.0 12/14/2016 0228   MCHC 35.2 12/14/2016 0228   RDW 14.0 12/14/2016 0228   LYMPHSABS 1.2 12/10/2016 1845   MONOABS 1.1 (H) 12/10/2016 1845   EOSABS 0.0 12/10/2016 1845   BASOSABS 0.0 12/10/2016 1845     Assessment/Plan:  1. AKI- in setting of malignant hypertension, thrombocytopenia, concern for TTP vs. Acute RPGN/TMA.  1. S/p temp HD catheter placement by PCCM on 12/12/16 2. started TPE for presumed TTP 12/12/16 3. S/p HD 12/13/16 before TPE due to rising BUN/Cr.  4. Normal complements, negative Hep B, C, HIV, anti GBM, ANA, and ANCA  5. Hold off on further HD at this time and follow BUN/Cr 2. Thrombocytopenia with suspicion for TTP/HUS: Fever, ARF, thrombocytopenia, hemolytic anemia (low haptoglobin, +schistocytes), and AMS  1. Discussed with Heme and PCCM.  2. Platelets are up after first TPE, will continue to follow 3. S/p 2 TPE sessions and platelets normal, discussed with Heme and they recommend to continue with treatments for now. 4. ADAMTS13 activity 63 % only mildly decreased 3. Malignant HTN- improving with medications 4. 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

## 2016-12-14 NOTE — Progress Notes (Signed)
Progress Note  Patient Name: Javar Eshbach Date of Encounter: 12/14/2016  Primary Cardiologist:   Subjective   The patient is stable from the cardiac viewpoint. I asked him again today if he had ever had syncope or presyncope. He has never had syncope or presyncope. He does not have significant palpitations.  Inpatient Medications    Scheduled Meds: .  stroke: mapping our early stages of recovery book   Does not apply Once  . sodium chloride   Intravenous Once  . sodium chloride   Intravenous Once  . amLODipine  10 mg Oral Daily  . anticoagulant sodium citrate  5 mL Intracatheter Once  . calcium gluconate IVPB  4 g Intravenous Once  . cloNIDine  0.1 mg Oral TID  . diphenhydrAMINE      . feeding supplement  1 Container Oral TID BM  . folic acid  1 mg Oral Daily  . heparin  2.4 mL Intravenous Once  . hydrALAZINE  50 mg Oral Q8H  . metoprolol tartrate  25 mg Oral BID  . nicotine  14 mg Transdermal Q24H  . polyethylene glycol  17 g Oral Daily  . predniSONE  60 mg Oral Q breakfast  . senna-docusate  1 tablet Oral BID  . sodium chloride flush  3 mL Intravenous Q12H   Continuous Infusions: . citrate dextrose     PRN Meds: sodium chloride, sodium chloride, acetaminophen **OR** acetaminophen (TYLENOL) oral liquid 160 mg/5 mL **OR** acetaminophen, acetaminophen, alteplase, chlorproMAZINE (THORAZINE) IV, diphenhydrAMINE, heparin, hydrALAZINE, lidocaine (PF), lidocaine-prilocaine, ondansetron **OR** ondansetron (ZOFRAN) IV, pentafluoroprop-tetrafluoroeth, promethazine, senna-docusate, sodium chloride flush   Vital Signs    Vitals:   12/14/16 0624 12/14/16 0700 12/14/16 0800 12/14/16 0830  BP: (!) 149/92 (!) 158/94 (!) 147/100 (!) 147/92  Pulse:  70 78 84  Resp:  18 16 18   Temp:    98.5 F (36.9 C)  TempSrc:    Oral  SpO2:    97%  Weight:      Height:        Intake/Output Summary (Last 24 hours) at 12/14/16 1025 Last data filed at 12/14/16 0831  Gross per 24 hour    Intake              820 ml  Output              802 ml  Net               18 ml   Filed Weights   12/13/16 0545 12/13/16 1000 12/14/16 0500  Weight: 179 lb 7.3 oz (81.4 kg) 181 lb 14.1 oz (82.5 kg) 182 lb 12.2 oz (82.9 kg)    Telemetry    There is normal sinus rhythm. There has been no recurrent wide-complex tachycardia.  - Personally Reviewed  ECG    Physical Exam   Patient is oriented person time and place. Affect is normal. Lungs are clear. Respiratory effort is not labored. Cardiac exam reveals S1 and S2.  Labs    Chemistry Recent Labs Lab 12/10/16 1845  12/12/16 0239 12/13/16 0343 12/13/16 0353 12/13/16 2226 12/14/16 0228  NA 136  < > 132* 128* 129* 131* 135  K 3.6  < > 3.6 4.1 4.1 3.7 3.5  CL 100*  < > 99* 95* 95* 93* 92*  CO2 20*  < > 20* 20*  --   --  27  GLUCOSE 147*  < > 112* 141* 135* 174* 115*  BUN 93*  < > 102*  107* 114* 66* 66*  CREATININE 8.67*  < > 9.78* 10.36* 11.00* 8.20* 7.20*  CALCIUM 9.1  < > 8.5* 8.7*  --   --  9.6  PROT 6.1*  --   --   --   --   --  5.8*  ALBUMIN 3.5  < > 2.8* 2.8*  --   --  3.2*  AST 41  --   --   --   --   --  24  ALT 30  --   --   --   --   --  21  ALKPHOS 57  --   --   --   --   --  53  BILITOT 1.8*  --   --   --   --   --  0.8  GFRNONAA 6*  < > 5* 5*  --   --  8*  GFRAA 7*  < > 6* 6*  --   --  9*  ANIONGAP 16*  < > 13 13  --   --  16*  < > = values in this interval not displayed.   Hematology Recent Labs Lab 12/12/16 0239 12/13/16 0342 12/13/16 0353 12/13/16 2226 12/14/16 0228  WBC 7.5 5.0  --   --  10.4  RBC 3.81* 3.91*  --   --  3.60*  HGB 11.6* 11.6* 11.6* 9.9* 10.8*  HCT 32.4* 33.3* 34.0* 29.0* 30.7*  MCV 85.0 85.2  --   --  85.3  MCH 30.4 29.7  --   --  30.0  MCHC 35.8 34.8  --   --  35.2  RDW 14.5 14.1  --   --  14.0  PLT 74* 107*  --   --  161    Cardiac Enzymes Recent Labs Lab 12/11/16 0705 12/13/16 1509 12/13/16 2039 12/14/16 0228  TROPONINI 0.46* 0.12* 0.20* 0.18*   No results for  input(s): TROPIPOC in the last 168 hours.   BNPNo results for input(s): BNP, PROBNP in the last 168 hours.   DDimer No results for input(s): DDIMER in the last 168 hours.   Radiology    Dg Abd 1 View  Result Date: 12/12/2016 CLINICAL DATA:  56 y/o  M; evaluation for metal prior to MRI. EXAM: ABDOMEN - 1 VIEW COMPARISON:  None. FINDINGS: Punctate metallic fragments project over the left hemi sacrum and there is a 20 mm fragment projecting over the left ilium. Normal bowel gas pattern. Lower lumbar spine degenerative changes. No acute osseous abnormality identified. IMPRESSION: Metal fragments projecting over the pelvis. Electronically Signed   By: Kristine Garbe M.D.   On: 12/12/2016 20:17   Ct Head Wo Contrast  Result Date: 12/12/2016 CLINICAL DATA:  Acute encephalopathy.  Hypertension EXAM: CT HEAD WITHOUT CONTRAST TECHNIQUE: Contiguous axial images were obtained from the base of the skull through the vertex without intravenous contrast. COMPARISON:  None. FINDINGS: Brain: Ventricle size and cerebral volume normal. Extensive white matter hypodensity diffusely. Brainstem and cerebellum normal. Negative for hemorrhage or mass. No shift of the midline structures. Vascular: No hyperdense vessel or unexpected calcification. Skull: Negative Sinuses/Orbits: Negative Other: None IMPRESSION: Extensive white matter disease bilaterally. No prior studies available however this pattern is most consistent with advanced chronic microvascular ischemia. No acute infarct or hemorrhage. Electronically Signed   By: Franchot Gallo M.D.   On: 12/12/2016 19:47   Mr Jodene Nam Neck Wo Contrast  Result Date: 12/14/2016 CLINICAL DATA:  CVA EXAM: MRA NECK WITHOUT CONTRAST MRA  HEAD WITHOUT CONTRAST TECHNIQUE: Multiplanar and multiecho pulse sequences of the neck were obtained without intravenous contrast. Angiographic images of the neck were obtained using MRA technique without intravenous contrast.; Angiographic images  of the Circle of Willis were obtained using MRA technique without intravenous contrast. COMPARISON:  Brain MRI 12/12/2016 FINDINGS: MRA NECK FINDINGS There is a normal variant aortic arch branching pattern with the brachiocephalic and left common carotid arteries sharing a common origin. The proximal arch vessels are normal. Both carotid bifurcations are normal. There is no evidence of dissection, aneurysm or hemodynamically significant stenosis of the internal carotid arteries. Apparent narrowing of the proximal left ICA petrous segment is favored to be artifactual, as this is not confirmed on the time-of-flight sequence of the head. Both vertebral artery origins are normal. The vertebral system is left dominant. Both vertebral arteries are normal to their confluence at the basilar artery. MRA HEAD FINDINGS Intracranial internal carotid arteries: There is fusiform dilatation of the communicating segment of the right internal carotid artery. The left ICA is normal. Anterior cerebral arteries: Normal. Middle cerebral arteries: Normal. Posterior communicating arteries: Present on the right. Posterior cerebral arteries: Fetal predominant origin of the right posterior cerebral artery. Otherwise normal. Basilar artery: Normal. Vertebral arteries: Left dominant. Normal. Superior cerebellar arteries: Normal. Anterior inferior cerebellar arteries: Normal. Posterior inferior cerebellar arteries: Normal. IMPRESSION: 1. No intracranial arterial occlusion or high-grade stenosis. 2. Mildly dilated appearance of the communicating segment of the right internal carotid artery on the brain time-of-flight sequence may indicate carotid ectasia. However, the appearance is likely at least partially artifactual, as it is not reproduced on the intracranial portion of the MRA neck acquisition. 3. No cervical internal carotid or vertebral artery abnormality. Electronically Signed   By: Ulyses Jarred M.D.   On: 12/14/2016 05:46   Mr Brain  Wo Contrast  Result Date: 12/12/2016 CLINICAL DATA:  Headache and hypertension.  Right body numbness EXAM: MRI HEAD WITHOUT CONTRAST TECHNIQUE: Multiplanar, multiecho pulse sequences of the brain and surrounding structures were obtained without intravenous contrast. COMPARISON:  CT head 12/12/2016 FINDINGS: Brain: Small linear area of restricted diffusion in the left frontal white matter most consistent with acute infarct. Small area of restricted diffusion in the right parietal white matter also consistent with acute infarct. Extensive hyperintensity throughout the periventricular and deep white matter bilaterally. Hyperintensity in the pons bilaterally extending into the cerebellum bilaterally. These findings are most likely due to chronic ischemia. Negative for hemorrhage or mass. Ventricle size normal. No shift of the midline structures. Vascular: Normal arterial flow void Skull and upper cervical spine: Negative Sinuses/Orbits: Negative Other: None IMPRESSION: Small areas of restricted diffusion in the left frontal lobe and right parietal lobe most consistent with acute infarction. Extensive white matter changes bilaterally most compatible chronic microvascular ischemia. Hyperintensity in the pons and cerebellum most likely due to chronic ischemia. Given history severe hypertension, PRES also a consideration. Electronically Signed   By: Franchot Gallo M.D.   On: 12/12/2016 21:10   Dg Chest Port 1 View  Result Date: 12/12/2016 CLINICAL DATA:  Central line placement EXAM: PORTABLE CHEST 1 VIEW COMPARISON:  12/11/2016 FINDINGS: New right internal jugular central line tip in the SVC at the azygos level, 6 cm above the right atrium. No pneumothorax. Lungs remain clear. IMPRESSION: Right internal jugular central line tip in the SVC at the azygos level. Electronically Signed   By: Nelson Chimes M.D.   On: 12/12/2016 15:27   Mr Jodene Nam Head/brain YI Cm  Result  Date: 12/14/2016 CLINICAL DATA:  CVA EXAM: MRA NECK  WITHOUT CONTRAST MRA HEAD WITHOUT CONTRAST TECHNIQUE: Multiplanar and multiecho pulse sequences of the neck were obtained without intravenous contrast. Angiographic images of the neck were obtained using MRA technique without intravenous contrast.; Angiographic images of the Circle of Willis were obtained using MRA technique without intravenous contrast. COMPARISON:  Brain MRI 12/12/2016 FINDINGS: MRA NECK FINDINGS There is a normal variant aortic arch branching pattern with the brachiocephalic and left common carotid arteries sharing a common origin. The proximal arch vessels are normal. Both carotid bifurcations are normal. There is no evidence of dissection, aneurysm or hemodynamically significant stenosis of the internal carotid arteries. Apparent narrowing of the proximal left ICA petrous segment is favored to be artifactual, as this is not confirmed on the time-of-flight sequence of the head. Both vertebral artery origins are normal. The vertebral system is left dominant. Both vertebral arteries are normal to their confluence at the basilar artery. MRA HEAD FINDINGS Intracranial internal carotid arteries: There is fusiform dilatation of the communicating segment of the right internal carotid artery. The left ICA is normal. Anterior cerebral arteries: Normal. Middle cerebral arteries: Normal. Posterior communicating arteries: Present on the right. Posterior cerebral arteries: Fetal predominant origin of the right posterior cerebral artery. Otherwise normal. Basilar artery: Normal. Vertebral arteries: Left dominant. Normal. Superior cerebellar arteries: Normal. Anterior inferior cerebellar arteries: Normal. Posterior inferior cerebellar arteries: Normal. IMPRESSION: 1. No intracranial arterial occlusion or high-grade stenosis. 2. Mildly dilated appearance of the communicating segment of the right internal carotid artery on the brain time-of-flight sequence may indicate carotid ectasia. However, the appearance is  likely at least partially artifactual, as it is not reproduced on the intracranial portion of the MRA neck acquisition. 3. No cervical internal carotid or vertebral artery abnormality. Electronically Signed   By: Ulyses Jarred M.D.   On: 12/14/2016 05:46    Cardiac Studies     Patient Profile     56 y.o. male admitted with multiple medical problems. Cardiology is consulted for a brief burst of wide complex tachycardia.  Assessment & Plan    Wide complex tachycardia    This was self-limited on the monitor. The patient has normal left jugular function. There is no history of syncope or presyncope. He does not have palpitations. He's been started on a low dose of beta blockade. With his prolonged PR interval. We may have to use even a smaller dose.  Severe hypertension    Patient will need ongoing aggressive medication treatment for his blood pressure.  Prolonged PR interval    Patient has a prolonged PR interval at baseline. On the monitor he has intermittent periods where his PR interval is even much longer. I am not seeing any definite AV block.  Dola Argyle, MD  12/14/2016, 10:25 AM

## 2016-12-14 NOTE — Progress Notes (Signed)
Pt continues to request to leave through out day. This RN has communicated and educated the pt on the importance to remain in hospital for care. Pt is complaint at times however unsure if has called for ride to leave, nursing will cont to monitor

## 2016-12-14 NOTE — Consult Note (Signed)
Patient Name: Frank Fuller Date of Encounter: 12/14/2016  Primary Cardiologist: New- Dr. Tyrell Antonio Problem List     Principal Problem:   ARF (acute renal failure) [facility]) Active Problems:   Thrombocytopenia (HCC)   Elevated troponin   Malignant hypertension   Hiccups   Elevated bilirubin   Nausea with vomiting   Uremia   Thrombotic microangiopathy (HCC)   CVA (cerebral vascular accident) ([place])   Hyponatremia   SVT (supraventricular tachycardia) (HCC)     Subjective   No complaints. Feeling "fabulous."   Inpatient Medications    Scheduled Meds: .  stroke: mapping our early stages of recovery book   Does not apply Once  . sodium chloride   Intravenous Once  . sodium chloride   Intravenous Once  . amLODipine  10 mg Oral Daily  . anticoagulant sodium citrate  5 mL Intracatheter Once  . calcium gluconate IVPB  4 g Intravenous Once  . citrate dextrose      . cloNIDine  0.1 mg Oral TID  . diphenhydrAMINE      . feeding supplement  1 Container Oral TID BM  . folic acid  1 mg Oral Daily  . heparin  2.4 mL Intravenous Once  . hydrALAZINE  50 mg Oral Q8H  . metoprolol tartrate  25 mg Oral BID  . nicotine  14 mg Transdermal Q24H  . polyethylene glycol  17 g Oral Daily  . predniSONE  60 mg Oral Q breakfast  . senna-docusate  1 tablet Oral BID  . sodium chloride flush  3 mL Intravenous Q12H   Continuous Infusions: . citrate dextrose     PRN Meds: sodium chloride, sodium chloride, acetaminophen **OR** acetaminophen (TYLENOL) oral liquid 160 mg/5 mL **OR** acetaminophen, acetaminophen, alteplase, chlorproMAZINE (THORAZINE) IV, diphenhydrAMINE, heparin, hydrALAZINE, lidocaine (PF), lidocaine-prilocaine, ondansetron **OR** ondansetron (ZOFRAN) IV, pentafluoroprop-tetrafluoroeth, promethazine, senna-docusate, sodium chloride flush   Vital Signs    Vitals:   12/14/16 0317 12/14/16 0400 12/14/16 0500 12/14/16 0624  BP: (!) 153/99 (!) 148/94  (!) 149/92  Pulse: 76 72     Resp: 18 14    Temp: 97.9 F (36.6 C)     TempSrc: Oral     SpO2: 99% 99%    Weight:   182 lb 12.2 oz (82.9 kg)   Height:        Intake/Output Summary (Last 24 hours) at 12/14/16 3790 Last data filed at 12/14/16 0421  Gross per 24 hour  Intake              820 ml  Output              902 ml  Net              -82 ml   Filed Weights   12/13/16 0545 12/13/16 1000 12/14/16 0500  Weight: 179 lb 7.3 oz (81.4 kg) 181 lb 14.1 oz (82.5 kg) 182 lb 12.2 oz (82.9 kg)    Physical Exam   GEN: Well nourished, well developed, in no acute distress.  HEENT: Grossly normal.  Neck: Supple, no JVD, carotid bruits, or masses. Cardiac: RRR, no murmurs, rubs, or gallops. No clubbing, cyanosis, edema.  Radials/DP/PT 2+ and equal bilaterally.  Respiratory:  Respirations regular and unlabored, clear to auscultation bilaterally. GI: Soft, nontender, nondistended, BS + x 4. MS: no deformity or atrophy. Skin: warm and dry, no rash. Neuro:  Strength and sensation are intact. Psych: AAOx3.  Normal affect.  Labs    CBC  Recent Labs  12/13/16 0342 12/13/16 0353 12/14/16 0228  WBC 5.0  --  10.4  HGB 11.6* 11.6* 10.8*  HCT 33.3* 34.0* 30.7*  MCV 85.2  --  85.3  PLT 107*  --  924   Basic Metabolic Panel  Recent Labs  12/12/16 0239 12/13/16 0343 12/13/16 0353 12/13/16 1509 12/14/16 0228  NA 132* 128* 129*  --  135  K 3.6 4.1 4.1  --  3.5  CL 99* 95* 95*  --  92*  CO2 20* 20*  --   --  27  GLUCOSE 112* 141* 135*  --  115*  BUN 102* 107* 114*  --  66*  CREATININE 9.78* 10.36* 11.00*  --  7.20*  CALCIUM 8.5* 8.7*  --   --  9.6  MG  --   --   --  1.7 2.8*  PHOS 4.4 5.5*  --   --   --    Liver Function Tests  Recent Labs  12/13/16 0343 12/14/16 0228  AST  --  24  ALT  --  21  ALKPHOS  --  53  BILITOT  --  0.8  PROT  --  5.8*  ALBUMIN 2.8* 3.2*   No results for input(s): LIPASE, AMYLASE in the last 72 hours. Cardiac Enzymes  Recent Labs  12/13/16 1509 12/13/16 2039  12/14/16 0228  TROPONINI 0.12* 0.20* 0.18*   BNP Invalid input(s): POCBNP D-Dimer No results for input(s): DDIMER in the last 72 hours. Hemoglobin A1C No results for input(s): HGBA1C in the last 72 hours. Fasting Lipid Panel  Recent Labs  12/14/16 0228  CHOL 137  HDL 46  LDLCALC 69  TRIG 112  CHOLHDL 3.0   Thyroid Function Tests  Recent Labs  12/13/16 1508  TSH 0.301*    Telemetry    NSR, possible 2nd degree AV block Type I - Personally Reviewed  Radiology    Dg Abd 1 View  Result Date: 12/12/2016 CLINICAL DATA:  56 y/o  M; evaluation for metal prior to MRI. EXAM: ABDOMEN - 1 VIEW COMPARISON:  None. FINDINGS: Punctate metallic fragments project over the left hemi sacrum and there is a 20 mm fragment projecting over the left ilium. Normal bowel gas pattern. Lower lumbar spine degenerative changes. No acute osseous abnormality identified. IMPRESSION: Metal fragments projecting over the pelvis. Electronically Signed   By: Kristine Garbe M.D.   On: 12/12/2016 20:17   Ct Head Wo Contrast  Result Date: 12/12/2016 CLINICAL DATA:  Acute encephalopathy.  Hypertension EXAM: CT HEAD WITHOUT CONTRAST TECHNIQUE: Contiguous axial images were obtained from the base of the skull through the vertex without intravenous contrast. COMPARISON:  None. FINDINGS: Brain: Ventricle size and cerebral volume normal. Extensive white matter hypodensity diffusely. Brainstem and cerebellum normal. Negative for hemorrhage or mass. No shift of the midline structures. Vascular: No hyperdense vessel or unexpected calcification. Skull: Negative Sinuses/Orbits: Negative Other: None IMPRESSION: Extensive white matter disease bilaterally. No prior studies available however this pattern is most consistent with advanced chronic microvascular ischemia. No acute infarct or hemorrhage. Electronically Signed   By: Franchot Gallo M.D.   On: 12/12/2016 19:47   Mr Jodene Nam Neck Wo Contrast  Result Date:  12/14/2016 CLINICAL DATA:  CVA EXAM: MRA NECK WITHOUT CONTRAST MRA HEAD WITHOUT CONTRAST TECHNIQUE: Multiplanar and multiecho pulse sequences of the neck were obtained without intravenous contrast. Angiographic images of the neck were obtained using MRA technique without intravenous contrast.; Angiographic images of the Circle of Willis were obtained  using MRA technique without intravenous contrast. COMPARISON:  Brain MRI 12/12/2016 FINDINGS: MRA NECK FINDINGS There is a normal variant aortic arch branching pattern with the brachiocephalic and left common carotid arteries sharing a common origin. The proximal arch vessels are normal. Both carotid bifurcations are normal. There is no evidence of dissection, aneurysm or hemodynamically significant stenosis of the internal carotid arteries. Apparent narrowing of the proximal left ICA petrous segment is favored to be artifactual, as this is not confirmed on the time-of-flight sequence of the head. Both vertebral artery origins are normal. The vertebral system is left dominant. Both vertebral arteries are normal to their confluence at the basilar artery. MRA HEAD FINDINGS Intracranial internal carotid arteries: There is fusiform dilatation of the communicating segment of the right internal carotid artery. The left ICA is normal. Anterior cerebral arteries: Normal. Middle cerebral arteries: Normal. Posterior communicating arteries: Present on the right. Posterior cerebral arteries: Fetal predominant origin of the right posterior cerebral artery. Otherwise normal. Basilar artery: Normal. Vertebral arteries: Left dominant. Normal. Superior cerebellar arteries: Normal. Anterior inferior cerebellar arteries: Normal. Posterior inferior cerebellar arteries: Normal. IMPRESSION: 1. No intracranial arterial occlusion or high-grade stenosis. 2. Mildly dilated appearance of the communicating segment of the right internal carotid artery on the brain time-of-flight sequence may  indicate carotid ectasia. However, the appearance is likely at least partially artifactual, as it is not reproduced on the intracranial portion of the MRA neck acquisition. 3. No cervical internal carotid or vertebral artery abnormality. Electronically Signed   By: Ulyses Jarred M.D.   On: 12/14/2016 05:46   Mr Brain Wo Contrast  Result Date: 12/12/2016 CLINICAL DATA:  Headache and hypertension.  Right body numbness EXAM: MRI HEAD WITHOUT CONTRAST TECHNIQUE: Multiplanar, multiecho pulse sequences of the brain and surrounding structures were obtained without intravenous contrast. COMPARISON:  CT head 12/12/2016 FINDINGS: Brain: Small linear area of restricted diffusion in the left frontal white matter most consistent with acute infarct. Small area of restricted diffusion in the right parietal white matter also consistent with acute infarct. Extensive hyperintensity throughout the periventricular and deep white matter bilaterally. Hyperintensity in the pons bilaterally extending into the cerebellum bilaterally. These findings are most likely due to chronic ischemia. Negative for hemorrhage or mass. Ventricle size normal. No shift of the midline structures. Vascular: Normal arterial flow void Skull and upper cervical spine: Negative Sinuses/Orbits: Negative Other: None IMPRESSION: Small areas of restricted diffusion in the left frontal lobe and right parietal lobe most consistent with acute infarction. Extensive white matter changes bilaterally most compatible chronic microvascular ischemia. Hyperintensity in the pons and cerebellum most likely due to chronic ischemia. Given history severe hypertension, PRES also a consideration. Electronically Signed   By: Franchot Gallo M.D.   On: 12/12/2016 21:10   Dg Chest Port 1 View  Result Date: 12/12/2016 CLINICAL DATA:  Central line placement EXAM: PORTABLE CHEST 1 VIEW COMPARISON:  12/11/2016 FINDINGS: New right internal jugular central line tip in the SVC at the  azygos level, 6 cm above the right atrium. No pneumothorax. Lungs remain clear. IMPRESSION: Right internal jugular central line tip in the SVC at the azygos level. Electronically Signed   By: Nelson Chimes M.D.   On: 12/12/2016 15:27   Mr Jodene Nam Head/brain QQ Cm  Result Date: 12/14/2016 CLINICAL DATA:  CVA EXAM: MRA NECK WITHOUT CONTRAST MRA HEAD WITHOUT CONTRAST TECHNIQUE: Multiplanar and multiecho pulse sequences of the neck were obtained without intravenous contrast. Angiographic images of the neck were obtained using MRA technique  without intravenous contrast.; Angiographic images of the Circle of Willis were obtained using MRA technique without intravenous contrast. COMPARISON:  Brain MRI 12/12/2016 FINDINGS: MRA NECK FINDINGS There is a normal variant aortic arch branching pattern with the brachiocephalic and left common carotid arteries sharing a common origin. The proximal arch vessels are normal. Both carotid bifurcations are normal. There is no evidence of dissection, aneurysm or hemodynamically significant stenosis of the internal carotid arteries. Apparent narrowing of the proximal left ICA petrous segment is favored to be artifactual, as this is not confirmed on the time-of-flight sequence of the head. Both vertebral artery origins are normal. The vertebral system is left dominant. Both vertebral arteries are normal to their confluence at the basilar artery. MRA HEAD FINDINGS Intracranial internal carotid arteries: There is fusiform dilatation of the communicating segment of the right internal carotid artery. The left ICA is normal. Anterior cerebral arteries: Normal. Middle cerebral arteries: Normal. Posterior communicating arteries: Present on the right. Posterior cerebral arteries: Fetal predominant origin of the right posterior cerebral artery. Otherwise normal. Basilar artery: Normal. Vertebral arteries: Left dominant. Normal. Superior cerebellar arteries: Normal. Anterior inferior cerebellar  arteries: Normal. Posterior inferior cerebellar arteries: Normal. IMPRESSION: 1. No intracranial arterial occlusion or high-grade stenosis. 2. Mildly dilated appearance of the communicating segment of the right internal carotid artery on the brain time-of-flight sequence may indicate carotid ectasia. However, the appearance is likely at least partially artifactual, as it is not reproduced on the intracranial portion of the MRA neck acquisition. 3. No cervical internal carotid or vertebral artery abnormality. Electronically Signed   By: Ulyses Jarred M.D.   On: 12/14/2016 05:46    Cardiac Studies   2D ECHO: 12/12/2016 LV EF: 50% -   55% Study Conclusions - Left ventricle: The cavity size was normal. Wall thickness was   increased in a pattern of mild LVH. Systolic function was low   normal to mildly reduced. The estimated ejection fraction was in   the range of 50% to 55%. Wall motion was normal; there were no   regional wall motion abnormalities. Doppler parameters are   consistent with abnormal left ventricular relaxation (grade 1   diastolic dysfunction). - Aortic valve: There was no stenosis. - Aorta: Mildly dilated ascending aorta. Aortic root dimension: 38   mm (ED). - Mitral valve: There was no significant regurgitation. - Left atrium: The atrium was mildly to moderately dilated. - Right ventricle: The cavity size was normal. Systolic function   was normal. - Right atrium: The atrium was mildly dilated. - Pulmonary arteries: No complete TR doppler jet so unable to   estimate PA systolic pressure. - Inferior vena cava: The vessel was normal in size. The   respirophasic diameter changes were in the normal range (>= 50%),   consistent with normal central venous pressure. Impressions: - Normal LV size with mild LV hypertrophy. EF 50-55%, low normal to   mildly reduced systolic function. Normal RV size and systolic   function. No significant valvular abnormalities.   Patient  Profile     Pt is a 56 yo F with a PMHx of HTN who was transferred from [facility] to [facility] on 12/10/2016 with complaint of nausea, vomiting, hiccups, and generalized malaise found to have acute kidney injury, malignant HTN, thrombocytopenia, and acute CVA. Cardiology was consulted as patient was found to have an episode of SVT with abberency on telemetry   Assessment & Plan    SVT with abberency: Patient has a 36 beat  run of wide complex tachycardia on telemetry yesterday felt to be most c/w SVT with abberancy. Not clearly symptomatic. The patient has normal LV systolic function, normal electrolytes, and no evidence of acute coronary syndrome. Started on metoprolol 25 mg BID with no recurrence. TSH mildly suppressed at 0.301 but free T4 normal.   Possible 2nd degree AV block Type I: noted on tele. Dr. Burt Knack to follow. No further work up.   Elevated troponin:  0.50> 0.46 > 0.12 > 0.20> 0.18. His troponin has been mildly elevated in the setting of acute renal failure and malignant hypertension. No c/w ACS. No chest pain.   Malignant hypertension: In the setting of noncompliance with medications at home. -Goal systolic blood pressure between 140-180 in the setting of CVA as per neurology recommendation  - Imdur discontinued to make room for Metoprolol 25 mg BID -Continue amlodipine, clonidine, and hydralazine.   AKI: In the setting of malignant HTN and thrombocytopenia. There is concern for TTP vs. Acute RPGN/TMA. Currently undergoing HD.  -Nephrology following   Thrombocytopenia: Pt presented with ARF, thrombocytopenia, hemolytic anemia with low haptoglobin and elevated LDH, and some alteration in his mental status. Mental status now improved. Suspicion for TTP/HUS. Platelets are up after plasma exchange. -Hematology following   Acute CVA:  Per MRI.  -Neurology following   Signed, Angelena Form, PA-C  12/14/2016, 8:22 AM   Pt seen by Dr Ron Parker today. Please see his note.    Sherren Mocha 12/14/2016 12:21 PM

## 2016-12-14 NOTE — Progress Notes (Signed)
STROKE TEAM PROGRESS NOTE   SUBJECTIVE (INTERVAL HISTORY) Patient is more alert and interactive today. Feels back to baseline. No new complaints.he has thrombocytopenia and was transfused   OBJECTIVE Temp:  [97.4 F (36.3 C)-99 F (37.2 C)] 97.4 F (36.3 C) (02/22 1224) Pulse Rate:  [70-94] 84 (02/22 0830) Cardiac Rhythm: Heart block (02/22 0700) Resp:  [12-28] 18 (02/22 0830) BP: (120-188)/(74-103) 147/92 (02/22 0830) SpO2:  [95 %-99 %] 97 % (02/22 0830) Weight:  [182 lb 12.2 oz (82.9 kg)] 182 lb 12.2 oz (82.9 kg) (02/22 0500)  CBC:  Recent Labs Lab 12/10/16 1845  12/13/16 0342  12/13/16 2226 12/14/16 0228  WBC 7.8  < > 5.0  --   --  10.4  NEUTROABS 5.6  --   --   --   --   --   HGB 14.6  < > 11.6*  < > 9.9* 10.8*  HCT 40.9  < > 33.3*  < > 29.0* 30.7*  MCV 84.5  < > 85.2  --   --  85.3  PLT 55*  < > 107*  --   --  161  < > = values in this interval not displayed.  Basic Metabolic Panel:  Recent Labs Lab 12/12/16 0239 12/13/16 0343  12/13/16 1509 12/13/16 2226 12/14/16 0228  NA 132* 128*  < >  --  131* 135  K 3.6 4.1  < >  --  3.7 3.5  CL 99* 95*  < >  --  93* 92*  CO2 20* 20*  --   --   --  27  GLUCOSE 112* 141*  < >  --  174* 115*  BUN 102* 107*  < >  --  66* 66*  CREATININE 9.78* 10.36*  < >  --  8.20* 7.20*  CALCIUM 8.5* 8.7*  --   --   --  9.6  MG  --   --   --  1.7  --  2.8*  PHOS 4.4 5.5*  --   --   --   --   < > = values in this interval not displayed.  Lipid Panel:     Component Value Date/Time   CHOL 137 12/14/2016 0228   TRIG 112 12/14/2016 0228   HDL 46 12/14/2016 0228   CHOLHDL 3.0 12/14/2016 0228   VLDL 22 12/14/2016 0228   LDLCALC 69 12/14/2016 0228     PHYSICAL EXAM Frail middle aged african [place] male not in distress. . Afebrile. Head is nontraumatic. Neck is supple without bruit.    Cardiac exam no murmur or gallop. Lungs are clear to auscultation. Distal pulses are well felt. Neurological Exam ;  Awake  Alert oriented x 3.  Normal speech and language.eye movements full without nystagmus.fundi were not visualized. Vision acuity and fields appear normal. Hearing is normal. Palatal movements are normal. Face symmetric. Tongue midline. Normal strength, tone, reflexes and coordination. Normal sensation. Gait deferred.  ASSESSMENT/PLAN [name] is a 56 y.o. male with history of HTN and smoking admitted with vomiting x 3 days who was found to be thrombocytopenic with adute kidney injury. He also developed transient right-sided numbness and weakness. MRI showed an acute small L frontal ischemic infarct.Marland Kitchen He did not receive IV t-PA due to thrombocytopenia, minimal deficits.   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 Extensive white matter disease. No acute abnormality  MRI  small  L frontal ischemic infarct  MRA head  No large vessel stenosis  MRA neck no large vessel stenosis  Carotid Doppler  See mra neck   2D Echo  EF 50-55%. No source of embolus   LDL 69  HgbA1c 5.2  SCDs for VTE prophylaxis Diet renal with fluid restriction Fluid restriction: 1500 mL Fluid; Room service appropriate? Yes; Fluid consistency: Thin  No antithrombotic prior to admission, now on No antithrombotic given thrombocytopenia (platelets increasing 55->107)  Therapy recommendations:  pending   Disposition:  pending   Malignant Hypertension  As high as 190/125 on arrival  Noncompliant with meds at home  ACE inhibitor on hold  Initially on On NTG drip  Neurologic symptom worsening with rapid lowering of  BP At this time, Gradual lowering of BP to normotensive range. Will adjust if PRES confirmed.  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 Agree with note above.  He presented with significantly elevated blood pressure and transient right-sided weakness and MRI scan shows bilateral small lacunar infarcts from small vessel disease.  Given patient's low platelet count and thrombocytopenia will not recommend aspirin for stroke prevention at the present time .Keep systolic blood pressure between 140 and 180.discussed with Dr. Candiss Norse Stroke team will sign off. Kindly call for questions.  Antony Contras, MD Medical Director [facility] Pager: [phone] 12/14/2016 1:15 PM  To contact Stroke Continuity provider, please refer to [email or web address] After hours, contact General Neurology

## 2016-12-14 NOTE — Progress Notes (Signed)
VASCULAR LAB    Patient had normal MRA of the neck. Please advise if you would still like carotid duplex.    Thank you,  Beverlyn Mcginness, RVT 12/14/2016, 8:22 AM

## 2016-12-15 LAB — HEMOGLOBIN A1C
Hgb A1c MFr Bld: 5 % (ref 4.8–5.6)
Mean Plasma Glucose: 97

## 2016-12-15 LAB — THERAPEUTIC PLASMA EXCHANGE (BLOOD BANK)
Plasma Exchange: 4200
Plasma volume needed: 4200
UNIT DIVISION: 0
UNIT DIVISION: 0
UNIT DIVISION: 0
UNIT DIVISION: 0
UNIT DIVISION: 0
UNIT DIVISION: 0
UNIT DIVISION: 0
UNIT DIVISION: 0
UNIT DIVISION: 0
UNIT DIVISION: 0
UNIT DIVISION: 0
UNIT DIVISION: 0
UNIT DIVISION: 0
UNIT DIVISION: 0
Unit division: 0
Unit division: 0
Unit division: 0
Unit division: 0

## 2016-12-15 LAB — HEPATITIS C VRS RNA DETECT BY PCR-QUAL: Hepatitis C Vrs RNA by PCR-Qual: POSITIVE — AB

## 2016-12-15 LAB — T3, FREE: T3 FREE: 1.6 pg/mL — AB (ref 2.0–4.4)

## 2016-12-15 LAB — T3: T3 TOTAL: 66 ng/dL — AB (ref 71–180)

## 2016-12-18 ENCOUNTER — Other Ambulatory Visit: Payer: Self-pay

## 2016-12-18 NOTE — Patient Outreach (Signed)
[place] [facility]) Care Management  12/18/2016  Frank Fuller [birth year] [id number]     EMMI- Stroke RED ON EMMI ALERT Day #  Date: 12/16/16 Red Alert Reason: " Been able to take every dose of meds? No"    Problems setting up rehab? Yes"    Outreach attempt #1 to patient. Spoke with patient. Patient states he did not respond to questions that way and machine must have heard him incorrectly. He was able to get all his meds and has been taking them. Patient states that since starting some of his new meds he has had a slight headache and stomach ache. He has f/u appt tomorrow at [facility] and will address the matter with MD then. He voices he has transportation to appts.patient declined that he needed rehab. He states he is able to care for himself.  Patient advised that he will continue to get automated EMMI stroke post discharge calls and will receive call from a nurse if any of his responses are abnormal. He voiced understanding and was appreciative of call.    Plan: RN CM will notify [facility] administrative assistant of case status.   Enzo Montgomery, RN,BSN,CCM [facility] Telephonic Care Management Coordinator Direct Phone: [phone] Toll Free: [phone] Fax: [phone]

## 2017-01-04 DIAGNOSIS — G9340 Encephalopathy, unspecified: Secondary | ICD-10-CM
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
## 2017-01-09 ENCOUNTER — Other Ambulatory Visit: Payer: Self-pay | Admitting: Surgery

## 2017-01-09 DIAGNOSIS — N186 End stage renal disease: Secondary | ICD-10-CM

## 2017-01-17 ENCOUNTER — Encounter: Payer: Self-pay | Admitting: Surgery

## 2017-01-29 ENCOUNTER — Other Ambulatory Visit (HOSPITAL_COMMUNITY): Payer: Medicaid Other

## 2017-01-29 ENCOUNTER — Encounter: Payer: Medicaid Other | Admitting: Surgery

## 2017-01-29 ENCOUNTER — Encounter (HOSPITAL_COMMUNITY): Payer: Medicaid Other

## 2017-01-31 ENCOUNTER — Ambulatory Visit (HOSPITAL_COMMUNITY)
Admission: RE | Admit: 2017-01-31 | Discharge: 2017-01-31 | Disposition: A | Discharge status: Home / Self Care | Payer: Medicaid Other | Source: Ambulatory Visit | Attending: Surgery | Admitting: Surgery

## 2017-01-31 ENCOUNTER — Encounter: Payer: Self-pay | Admitting: Vascular Surgery

## 2017-01-31 ENCOUNTER — Ambulatory Visit (INDEPENDENT_AMBULATORY_CARE_PROVIDER_SITE_OTHER): Payer: Medicaid Other | Admitting: Vascular Surgery

## 2017-01-31 ENCOUNTER — Other Ambulatory Visit: Payer: Self-pay

## 2017-01-31 ENCOUNTER — Ambulatory Visit (INDEPENDENT_AMBULATORY_CARE_PROVIDER_SITE_OTHER)
Admission: RE | Admit: 2017-01-31 | Discharge: 2017-01-31 | Disposition: A | Discharge status: Home / Self Care | Payer: Medicaid Other | Source: Ambulatory Visit | Attending: Surgery | Admitting: Surgery

## 2017-01-31 VITALS — BP 187/124 | HR 67 | Resp 20 | Ht 75.0 in | Wt 173.0 lb

## 2017-01-31 DIAGNOSIS — N184 Chronic kidney disease, stage 4 (severe): Secondary | ICD-10-CM | POA: Diagnosis not present

## 2017-01-31 DIAGNOSIS — N186 End stage renal disease: Secondary | ICD-10-CM

## 2017-01-31 NOTE — Progress Notes (Signed)
Patient name: Frank Fuller MRN: [id number] DOB: July 10, 1961 Sex: male  REASON FOR CONSULT: To evaluate for hemodialysis access. Referred by Dr. Joelyn Oms.  HPI: Frank Fuller is a 56 y.o. male, who is on dialysis and [place]. He dialyzes on Tuesdays Thursdays and Saturdays. He uses a right IJ tunneled dialysis catheter. He was sent for evaluation for hemodialysis access.  I have reviewed the records that were sent with the patient. The patient had a tunneled dialysis catheter placed by interventional radiology on 12/18/2016. According to the records he does have hepatitis B.   He denies any uremic symptoms. Specifically, he denies nausea, vomiting, fatigue, anorexia, or palpitations.  Past Medical History:  Diagnosis Date  . Hypertension     Family History  Problem Relation Age of Onset  . Hypertension Mother     SOCIAL HISTORY: Social History   Social History  . Marital status: Single    Spouse name: N/A  . Number of children: N/A  . Years of education: N/A   Occupational History  . Not on file.   Social History Main Topics  . Smoking status: Current Every Day Smoker    Packs/day: 1.00    Years: 27.00    Types: Cigarettes  . Smokeless tobacco: Never Used  . Alcohol use No  . Drug use: No  . Sexual activity: Yes    Partners: Female    Birth control/ protection: None   Other Topics Concern  . Not on file   Social History Narrative  . No narrative on file    No Known Allergies  Current Outpatient Prescriptions  Medication Sig Dispense Refill  . hydrALAZINE (APRESOLINE) 100 MG tablet   11  . labetalol (NORMODYNE) 300 MG tablet TK 1 T PO BID  11  . Acetaminophen (TYLENOL PO) Take 2 tablets by mouth every 6 (six) hours as needed (pain/headache).    Marland Kitchen amLODipine (NORVASC) 10 MG tablet Take 10 mg by mouth daily.     No current facility-administered medications for this visit.     REVIEW OF SYSTEMS:  [X]  denotes positive finding, [ ]  denotes  negative finding Cardiac  Comments:  Chest pain or chest pressure:    Shortness of breath upon exertion:    Short of breath when lying flat:    Irregular heart rhythm:        Vascular    Pain in calf, thigh, or hip brought on by ambulation:    Pain in feet at night that wakes you up from your sleep:     Blood clot in your veins:    Leg swelling:         Pulmonary    Oxygen at home:    Productive cough:     Wheezing:         Neurologic    Sudden weakness in arms or legs:     Sudden numbness in arms or legs:     Sudden onset of difficulty speaking or slurred speech:    Temporary loss of vision in one eye:     Problems with dizziness:         Gastrointestinal    Blood in stool:     Vomited blood:         Genitourinary    Burning when urinating:     Blood in urine:        Psychiatric    Major depression:         Hematologic  Bleeding problems:    Problems with blood clotting too easily:        Skin    Rashes or ulcers:        Constitutional    Fever or chills:      PHYSICAL EXAM: Vitals:   01/31/17 1508 01/31/17 1510  BP: (!) 193/129 (!) 187/124  Pulse: 67   Resp: 20   SpO2: 97%   Weight: 173 lb (78.5 kg)   Height: 6\' 3"  (1.905 m)     GENERAL: The patient is a well-nourished male, in no acute distress. The vital signs are documented above. CARDIAC: There is a regular rate and rhythm.  VASCULAR: I do not detect carotid bruits. He has palpable brachial and radial pulses bilaterally. PULMONARY: There is good air exchange bilaterally without wheezing or rales. ABDOMEN: Soft and non-tender with normal pitched bowel sounds.  MUSCULOSKELETAL: There are no major deformities or cyanosis. NEUROLOGIC: No focal weakness or paresthesias are detected. SKIN: There are no ulcers or rashes noted. PSYCHIATRIC: The patient has a normal affect.  DATA:   BILATERAL UPPER EXTREMITY VEIN MAP: I have independently interpreted his bilateral upper vein map.  On the right  side he does not appear to have a usable cephalic vein or basilic vein.  On the left side, he does not have appeared to have a usable cephalic vein or basilic vein.  BILATERAL UPPER EXTREMITY ARTERIAL DUPLEX: Jennings Books interpreted his bilateral upper extremity arterial duplex scan. The brachial arteries appear to be good size. He has a triphasic radial and ulnar waveform bilaterally.  MEDICAL ISSUES:  END-STAGE RENAL DISEASE: Based on his vein mapping looks like he will likely require an AV graft. However, certainly if I see an adequate vein that I will attempt a fistula. His surgery is scheduled for 02/16/2017. This is a nondialysis day.  I have explained the indications for placement of an AV fistula or AV graft. I've explained that if at all possible we will place an AV fistula.  I have reviewed the risks of placement of an AV fistula including but not limited to: failure of the fistula to mature, need for subsequent interventions, and thrombosis. In addition I have reviewed the potential complications of placement of an AV graft. These risks include, but are not limited to, graft thrombosis, graft infection, wound healing problems, bleeding, arm swelling, and steal syndrome. All the patient's questions were answered and they are agreeable to proceed with surgery.  [name] [facility] [phone]

## 2017-02-01 ENCOUNTER — Encounter: Payer: Self-pay | Admitting: Nephrology

## 2017-02-13 ENCOUNTER — Encounter (HOSPITAL_COMMUNITY): Payer: Self-pay | Admitting: *Deleted

## 2017-02-13 NOTE — Progress Notes (Signed)
Pt denies SOB, chest pain, and being under the care of a cardiologist. Pt denies being under the care of a cardiologist. Pt denies having a cardiac cath but stated that a stress test is scheduled for the near future. Pt verbalized understanding of all pre-op instructions. Anesthesia asked to review pt history ( EKG, CXR, elevated troponin ).

## 2017-02-15 ENCOUNTER — Encounter (HOSPITAL_COMMUNITY): Payer: Self-pay | Admitting: Emergency Medicine
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
## 2017-02-16 ENCOUNTER — Ambulatory Visit (HOSPITAL_COMMUNITY): Admission: RE | Admit: 2017-02-16 | Payer: Medicaid Other | Source: Ambulatory Visit | Admitting: Vascular Surgery

## 2017-02-16 HISTORY — DX: Accidental discharge from unspecified firearms or gun, initial encounter: W34.00XA

## 2017-02-16 HISTORY — DX: Major depressive disorder, single episode, unspecified: F32.9

## 2017-02-16 HISTORY — DX: Pneumonia, unspecified organism: J18.9

## 2017-02-16 HISTORY — DX: Depression, unspecified: F32.A

## 2017-02-16 SURGERY — ARTERIOVENOUS (AV) FISTULA CREATION
Anesthesia: Monitor Anesthesia Care | Laterality: Left

## 2017-02-26 ENCOUNTER — Ambulatory Visit: Payer: Self-pay | Admitting: Neurology

## 2017-02-27 ENCOUNTER — Encounter: Payer: Self-pay | Admitting: Neurology

## 2017-06-23 HISTORY — PX: EXCHANGE OF A DIALYSIS CATHETER: SHX5818

## 2017-07-08 DIAGNOSIS — I161 Hypertensive emergency: Secondary | ICD-10-CM

## 2017-07-08 DIAGNOSIS — T82898A Other specified complication of vascular prosthetic devices, implants and grafts, initial encounter: Secondary | ICD-10-CM

## 2017-07-08 DIAGNOSIS — I10 Essential (primary) hypertension: Secondary | ICD-10-CM

## 2017-07-08 DIAGNOSIS — R58 Hemorrhage, not elsewhere classified: Secondary | ICD-10-CM

## 2017-07-08 DIAGNOSIS — N186 End stage renal disease: Secondary | ICD-10-CM | POA: Diagnosis not present

## 2017-07-09 DIAGNOSIS — I10 Essential (primary) hypertension: Secondary | ICD-10-CM | POA: Diagnosis not present

## 2017-07-09 DIAGNOSIS — T82898A Other specified complication of vascular prosthetic devices, implants and grafts, initial encounter: Secondary | ICD-10-CM | POA: Diagnosis not present

## 2017-07-09 DIAGNOSIS — I161 Hypertensive emergency: Secondary | ICD-10-CM | POA: Diagnosis not present

## 2017-07-09 DIAGNOSIS — N186 End stage renal disease: Secondary | ICD-10-CM | POA: Diagnosis not present

## 2017-07-09 DIAGNOSIS — R58 Hemorrhage, not elsewhere classified: Secondary | ICD-10-CM | POA: Diagnosis not present

## 2017-07-14 ENCOUNTER — Emergency Department (HOSPITAL_COMMUNITY): Payer: Medicare Other

## 2017-07-14 ENCOUNTER — Observation Stay (HOSPITAL_COMMUNITY)
Admission: EM | Admit: 2017-07-14 | Discharge: 2017-07-15 | Disposition: A | Discharge status: Home / Self Care | Payer: Medicare Other | Attending: Internal Medicine | Admitting: Internal Medicine

## 2017-07-14 ENCOUNTER — Encounter (HOSPITAL_COMMUNITY): Payer: Self-pay | Admitting: Emergency Medicine

## 2017-07-14 DIAGNOSIS — N2581 Secondary hyperparathyroidism of renal origin: Secondary | ICD-10-CM | POA: Diagnosis not present

## 2017-07-14 DIAGNOSIS — D631 Anemia in chronic kidney disease: Secondary | ICD-10-CM | POA: Insufficient documentation

## 2017-07-14 DIAGNOSIS — B1920 Unspecified viral hepatitis C without hepatic coma: Secondary | ICD-10-CM | POA: Insufficient documentation

## 2017-07-14 DIAGNOSIS — Z8673 Personal history of transient ischemic attack (TIA), and cerebral infarction without residual deficits: Secondary | ICD-10-CM | POA: Diagnosis not present

## 2017-07-14 DIAGNOSIS — R109 Unspecified abdominal pain: Secondary | ICD-10-CM | POA: Insufficient documentation

## 2017-07-14 DIAGNOSIS — I120 Hypertensive chronic kidney disease with stage 5 chronic kidney disease or end stage renal disease: Secondary | ICD-10-CM | POA: Diagnosis not present

## 2017-07-14 DIAGNOSIS — I160 Hypertensive urgency: Principal | ICD-10-CM | POA: Diagnosis present

## 2017-07-14 DIAGNOSIS — J90 Pleural effusion, not elsewhere classified: Secondary | ICD-10-CM | POA: Insufficient documentation

## 2017-07-14 DIAGNOSIS — I714 Abdominal aortic aneurysm, without rupture, unspecified: Secondary | ICD-10-CM

## 2017-07-14 DIAGNOSIS — Z79899 Other long term (current) drug therapy: Secondary | ICD-10-CM | POA: Insufficient documentation

## 2017-07-14 DIAGNOSIS — Z992 Dependence on renal dialysis: Secondary | ICD-10-CM | POA: Diagnosis not present

## 2017-07-14 DIAGNOSIS — R112 Nausea with vomiting, unspecified: Secondary | ICD-10-CM | POA: Diagnosis not present

## 2017-07-14 DIAGNOSIS — R110 Nausea: Secondary | ICD-10-CM | POA: Insufficient documentation

## 2017-07-14 DIAGNOSIS — F17210 Nicotine dependence, cigarettes, uncomplicated: Secondary | ICD-10-CM | POA: Insufficient documentation

## 2017-07-14 DIAGNOSIS — E8889 Other specified metabolic disorders: Secondary | ICD-10-CM | POA: Diagnosis not present

## 2017-07-14 DIAGNOSIS — I700 Atherosclerosis of aorta: Secondary | ICD-10-CM | POA: Diagnosis not present

## 2017-07-14 DIAGNOSIS — R197 Diarrhea, unspecified: Secondary | ICD-10-CM

## 2017-07-14 DIAGNOSIS — R0602 Shortness of breath: Secondary | ICD-10-CM | POA: Diagnosis not present

## 2017-07-14 DIAGNOSIS — D696 Thrombocytopenia, unspecified: Secondary | ICD-10-CM | POA: Diagnosis present

## 2017-07-14 DIAGNOSIS — R1084 Generalized abdominal pain: Secondary | ICD-10-CM

## 2017-07-14 DIAGNOSIS — R531 Weakness: Secondary | ICD-10-CM | POA: Diagnosis not present

## 2017-07-14 DIAGNOSIS — F329 Major depressive disorder, single episode, unspecified: Secondary | ICD-10-CM | POA: Diagnosis not present

## 2017-07-14 DIAGNOSIS — N186 End stage renal disease: Secondary | ICD-10-CM | POA: Diagnosis not present

## 2017-07-14 LAB — CBC WITH DIFFERENTIAL/PLATELET
BASOS ABS: 0 10*3/uL (ref 0.0–0.1)
BASOS PCT: 0 %
EOS ABS: 0.1 10*3/uL (ref 0.0–0.7)
Eosinophils Relative: 2 %
HEMATOCRIT: 32.4 % — AB (ref 39.0–52.0)
HEMOGLOBIN: 11.1 g/dL — AB (ref 13.0–17.0)
Lymphocytes Relative: 23 %
Lymphs Abs: 1.9 10*3/uL (ref 0.7–4.0)
MCH: 30.2 pg (ref 26.0–34.0)
MCHC: 34.3 g/dL (ref 30.0–36.0)
MCV: 88.3 fL (ref 78.0–100.0)
MONO ABS: 1.2 10*3/uL — AB (ref 0.1–1.0)
Monocytes Relative: 15 %
NEUTROS ABS: 4.9 10*3/uL (ref 1.7–7.7)
NEUTROS PCT: 60 %
Platelets: 76 10*3/uL — ABNORMAL LOW (ref 150–400)
RBC: 3.67 MIL/uL — ABNORMAL LOW (ref 4.22–5.81)
RDW: 16 % — AB (ref 11.5–15.5)
WBC: 8.2 10*3/uL (ref 4.0–10.5)

## 2017-07-14 LAB — COMPREHENSIVE METABOLIC PANEL
ALK PHOS: 56 U/L (ref 38–126)
ALT: 12 U/L — ABNORMAL LOW (ref 17–63)
ANION GAP: 12 (ref 5–15)
AST: 20 U/L (ref 15–41)
Albumin: 2.8 g/dL — ABNORMAL LOW (ref 3.5–5.0)
BILIRUBIN TOTAL: 0.8 mg/dL (ref 0.3–1.2)
BUN: 40 mg/dL — ABNORMAL HIGH (ref 6–20)
CALCIUM: 8.6 mg/dL — AB (ref 8.9–10.3)
CO2: 28 mmol/L (ref 22–32)
Chloride: 96 mmol/L — ABNORMAL LOW (ref 101–111)
Creatinine, Ser: 8.55 mg/dL — ABNORMAL HIGH (ref 0.61–1.24)
GFR calc Af Amer: 7 mL/min — ABNORMAL LOW (ref >60)
GFR calc non Af Amer: 6 mL/min — ABNORMAL LOW (ref >60)
Glucose, Bld: 96 mg/dL (ref 65–99)
Potassium: 3.7 mmol/L (ref 3.5–5.1)
Sodium: 136 mmol/L (ref 135–145)
TOTAL PROTEIN: 5.4 g/dL — AB (ref 6.5–8.1)

## 2017-07-14 LAB — I-STAT TROPONIN, ED
TROPONIN I, POC: 0.05 ng/mL (ref 0.00–0.08)
Troponin i, poc: 0.03 ng/mL (ref 0.00–0.08)

## 2017-07-14 LAB — LIPASE, BLOOD: Lipase: 35 U/L (ref 11–51)

## 2017-07-14 MED ORDER — PROMETHAZINE HCL 25 MG/ML IJ SOLN
25.0000 mg | Freq: Once | INTRAMUSCULAR | Status: AC
  Administered 2017-07-14: 25 mg via INTRAVENOUS
  Filled 2017-07-14: qty 1

## 2017-07-14 MED ORDER — METOCLOPRAMIDE HCL 5 MG/ML IJ SOLN: 10.0000 mg | Freq: Once | INTRAMUSCULAR | Status: DC

## 2017-07-14 MED ORDER — HYDRALAZINE HCL 25 MG PO TABS
100.0000 mg | ORAL_TABLET | Freq: Once | ORAL | Status: AC
  Administered 2017-07-14: 100 mg via ORAL
  Filled 2017-07-14: qty 4

## 2017-07-14 MED ORDER — LABETALOL HCL 5 MG/ML IV SOLN
10.0000 mg | Freq: Once | INTRAVENOUS | Status: AC
  Administered 2017-07-14: 10 mg via INTRAVENOUS
  Filled 2017-07-14: qty 4

## 2017-07-14 MED ORDER — FENTANYL CITRATE (PF) 100 MCG/2ML IJ SOLN
50.0000 ug | Freq: Once | INTRAMUSCULAR | Status: AC
  Administered 2017-07-14: 50 ug via INTRAVENOUS
  Filled 2017-07-14: qty 2

## 2017-07-14 MED ORDER — AMLODIPINE BESYLATE 5 MG PO TABS: 10.0000 mg | ORAL_TABLET | Freq: Every day | ORAL | Status: DC

## 2017-07-14 MED ORDER — HYDRALAZINE HCL 50 MG PO TABS: 100.0000 mg | ORAL_TABLET | Freq: Three times a day (TID) | ORAL | Status: DC

## 2017-07-14 MED ORDER — AMLODIPINE BESYLATE 5 MG PO TABS
10.0000 mg | ORAL_TABLET | Freq: Once | ORAL | Status: AC
  Administered 2017-07-14: 10 mg via ORAL
  Filled 2017-07-14: qty 2

## 2017-07-14 NOTE — ED Notes (Signed)
Spoke to [employer] on 3E who will call rapid response to assess patient for proper placement.  Discussed with admitting prior to admission orders being placed.

## 2017-07-14 NOTE — ED Notes (Signed)
Pt taken to ct 

## 2017-07-14 NOTE — ED Triage Notes (Signed)
Per EMS: Pt is coming from Dialysis today . Pt was only able to finish about half of his Tx b/c his BP kept rising.  Initial BP was 230/140, EMS gave 10 of labetalol and 4 of zofran. Pt c/o of blurred vision, N/V, Abd pain, and HA.  A&Ox4.
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
## 2017-07-14 NOTE — ED Notes (Signed)
Admitting at bedside 

## 2017-07-14 NOTE — ED Notes (Signed)
Pt received Labetolol enroute per [facility]-- received 1/2 dialysis treatment-- pt has dialysis catheter right subclavian, Right arm is restricted for future graft placement. Pt states that he has  Not been able to sleep due to shortness of breath for 3 days. No pedal edema,

## 2017-07-14 NOTE — Progress Notes (Signed)
Full admission note to follow.   Patient's bp at home normally 175/140's, currently above this but stable. Anticipate will be able to treat with oral meds and PRNs so stable to go to telemetry.   Alphonzo Grieve, MD IMTS - PGY2 Pager [phone]

## 2017-07-15 DIAGNOSIS — I714 Abdominal aortic aneurysm, without rupture, unspecified: Secondary | ICD-10-CM

## 2017-07-15 DIAGNOSIS — R112 Nausea with vomiting, unspecified: Secondary | ICD-10-CM

## 2017-07-15 DIAGNOSIS — I160 Hypertensive urgency: Secondary | ICD-10-CM | POA: Diagnosis not present

## 2017-07-15 DIAGNOSIS — R1084 Generalized abdominal pain: Secondary | ICD-10-CM

## 2017-07-15 DIAGNOSIS — N186 End stage renal disease: Secondary | ICD-10-CM | POA: Diagnosis not present

## 2017-07-15 DIAGNOSIS — D696 Thrombocytopenia, unspecified: Secondary | ICD-10-CM | POA: Diagnosis not present

## 2017-07-15 DIAGNOSIS — Z992 Dependence on renal dialysis: Secondary | ICD-10-CM | POA: Diagnosis not present

## 2017-07-15 LAB — PROTIME-INR
INR: 1.12
Prothrombin Time: 14.3 seconds (ref 11.4–15.2)

## 2017-07-15 LAB — COMPREHENSIVE METABOLIC PANEL
ALBUMIN: 3 g/dL — AB (ref 3.5–5.0)
ALK PHOS: 57 U/L (ref 38–126)
ALT: 11 U/L — ABNORMAL LOW (ref 17–63)
ANION GAP: 12 (ref 5–15)
AST: 21 U/L (ref 15–41)
BILIRUBIN TOTAL: 0.9 mg/dL (ref 0.3–1.2)
BUN: 49 mg/dL — AB (ref 6–20)
CALCIUM: 8.6 mg/dL — AB (ref 8.9–10.3)
CO2: 28 mmol/L (ref 22–32)
Chloride: 93 mmol/L — ABNORMAL LOW (ref 101–111)
Creatinine, Ser: 10.3 mg/dL — ABNORMAL HIGH (ref 0.61–1.24)
GFR calc Af Amer: 6 mL/min — ABNORMAL LOW (ref >60)
GFR calc non Af Amer: 5 mL/min — ABNORMAL LOW (ref >60)
GLUCOSE: 106 mg/dL — AB (ref 65–99)
Potassium: 3.8 mmol/L (ref 3.5–5.1)
Sodium: 133 mmol/L — ABNORMAL LOW (ref 135–145)
TOTAL PROTEIN: 5.9 g/dL — AB (ref 6.5–8.1)

## 2017-07-15 LAB — CBC
HEMATOCRIT: 33.4 % — AB (ref 39.0–52.0)
HEMOGLOBIN: 11.2 g/dL — AB (ref 13.0–17.0)
MCH: 29.8 pg (ref 26.0–34.0)
MCHC: 33.5 g/dL (ref 30.0–36.0)
MCV: 88.8 fL (ref 78.0–100.0)
Platelets: 80 10*3/uL — ABNORMAL LOW (ref 150–400)
RBC: 3.76 MIL/uL — ABNORMAL LOW (ref 4.22–5.81)
RDW: 16 % — ABNORMAL HIGH (ref 11.5–15.5)
WBC: 8.8 10*3/uL (ref 4.0–10.5)

## 2017-07-15 LAB — DIFFERENTIAL
Basophils Absolute: 0 10*3/uL (ref 0.0–0.1)
Basophils Relative: 0 %
EOS ABS: 0.1 10*3/uL (ref 0.0–0.7)
Eosinophils Relative: 2 %
Lymphocytes Relative: 21 %
Lymphs Abs: 1.9 10*3/uL (ref 0.7–4.0)
MONO ABS: 1.4 10*3/uL — AB (ref 0.1–1.0)
Monocytes Relative: 16 %
Neutro Abs: 5.4 10*3/uL (ref 1.7–7.7)
Neutrophils Relative %: 62 %

## 2017-07-15 MED ORDER — SEVELAMER CARBONATE 800 MG PO TABS: 2400.0000 mg | ORAL_TABLET | Freq: Three times a day (TID) | ORAL | Status: DC

## 2017-07-15 MED ORDER — LISINOPRIL 40 MG PO TABS: 40.0000 mg | ORAL_TABLET | Freq: Every day | ORAL | 0 refills | Status: DC

## 2017-07-15 MED ORDER — RENA-VITE PO TABS: 1.0000 | ORAL_TABLET | Freq: Every day | ORAL | Status: DC

## 2017-07-15 MED ORDER — HYDRALAZINE HCL 50 MG PO TABS
100.0000 mg | ORAL_TABLET | Freq: Three times a day (TID) | ORAL | Status: DC
  Administered 2017-07-15: 100 mg via ORAL
  Filled 2017-07-15: qty 2

## 2017-07-15 MED ORDER — AMLODIPINE BESYLATE 10 MG PO TABS
10.0000 mg | ORAL_TABLET | Freq: Every day | ORAL | Status: DC
  Administered 2017-07-15: 10 mg via ORAL
  Filled 2017-07-15: qty 1

## 2017-07-15 MED ORDER — CALCITRIOL 0.25 MCG PO CAPS: 0.2500 ug | ORAL_CAPSULE | ORAL | Status: DC

## 2017-07-15 MED ORDER — LABETALOL HCL 200 MG PO TABS
300.0000 mg | ORAL_TABLET | Freq: Two times a day (BID) | ORAL | Status: DC
  Administered 2017-07-15: 10:00:00 300 mg via ORAL
  Filled 2017-07-15: qty 1

## 2017-07-15 MED ORDER — LISINOPRIL 20 MG PO TABS: 20.0000 mg | ORAL_TABLET | Freq: Every day | ORAL | Status: DC

## 2017-07-15 MED ORDER — LABETALOL HCL 5 MG/ML IV SOLN
10.0000 mg | Freq: Once | INTRAVENOUS | Status: AC
  Administered 2017-07-15: 10 mg via INTRAVENOUS
  Filled 2017-07-15: qty 4

## 2017-07-15 NOTE — Progress Notes (Signed)
Pt left wallet and knife in room, Pt notified will return to pick up.
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
## 2017-07-15 NOTE — Discharge Summary (Signed)
Name: Frank [facility] MRN: [id number] DOB: 1961/03/07 56 y.o. PCP: Cathlyn Parsons, PA-C  Date of Admission: 07/14/2017  2:37 PM Date of Discharge: 07/16/2017 Attending Physician: No att. providers found  Discharge Diagnosis: Active Problems:   Thrombocytopenia ([place])   Hypertensive urgency   AAA (abdominal aortic aneurysm) without rupture ([place])   ESRD on hemodialysis [facility])  Discharge Medications: Allergies as of 07/15/2017   No Known Allergies     Medication List    STOP taking these medications   acetaminophen 325 MG tablet Commonly known as:  TYLENOL     TAKE these medications   amLODipine 10 MG tablet Commonly known as:  NORVASC Take 10 mg by mouth at bedtime.   ANALGESIC EX Apply 1 application topically 2 (two) times daily as needed (pain).   hydrALAZINE 100 MG tablet Commonly known as:  APRESOLINE Take 100 mg by mouth 3 (three) times daily. Hold for systolic blood pressure 366/44   labetalol 300 MG tablet Commonly known as:  NORMODYNE TAKE 1 TABLET BY MOUTH TWICE DAILY   lisinopril 40 MG tablet Commonly known as:  PRINIVIL,ZESTRIL Take 1 tablet (40 mg total) by mouth daily at 6 PM.            Discharge Care Instructions        Start     Ordered   07/15/17 0000  lisinopril (PRINIVIL,ZESTRIL) 40 MG tablet  Daily-1800     07/15/17 1220   07/15/17 0000  Increase activity slowly     07/15/17 1220   07/15/17 0000  Diet - low sodium heart healthy     07/15/17 1220   07/15/17 0000  Call MD for:  persistant dizziness or light-headedness     07/15/17 1220   07/15/17 0000  Call MD for:  severe uncontrolled pain     07/15/17 1220   07/15/17 0000  Call MD for:  difficulty breathing, headache or visual disturbances     07/15/17 1220   07/15/17 0000  Increase activity slowly     07/15/17 1240   07/15/17 0000  Diet - low sodium heart healthy     07/15/17 1240      Disposition and follow-up:   [facility] was discharged from [facility] in Stable condition.  At the hospital follow up visit please address:  1.  A. Right sided loculated pleural effusion with CT, Patient confirmed desire to follow at the [place]      B. Abdominal aortic aneurysm, incidentally found on CT      C. Uncontrolled hypertension with end organ failure, please address and assist with optimal control       D. Thrombocytopenia, please evaluate the patient for decreased PLT count if this is new  2.  Labs / imaging needed at time of follow-up: CBC  3.  Pending labs/ test needing follow-up: n/a  Follow-up Appointments: Follow-up Information    Cathlyn Parsons, PA-C Follow up in 1 week(s).   Specialty:  Physician Assistant Contact information: [place] [id number] [place] Hospital Course by problem list: Active Problems:   Thrombocytopenia ([place])   Hypertensive urgency   AAA (abdominal aortic aneurysm) without rupture ([place])   ESRD on hemodialysis ([place])   1. Hypertensive urgency: This patient stated that upon presenting to his hemodialysis session prior to admission he was observed to have a blood pressure greater than 250/175. He was noted that his pressures are typically  greater than 190/100 by nephrology as he is often noncompliant with medications. As such, the patient was administered his home dose of antihypertensives consisting of labetalol 300 mg twice a day, hydralazine 100 mg 3 times a day, and amlodipine daily. His pressures eventually decreased to 164/114 by discharge and he was advised to continue his home medication regimen and follow-up with hemodialysis on Tuesday as he had only completed half session the day of admission. Nephrology had advised the patient that they could continue dialysis the following day if he so desired, however, the patient clearly stated that he wished to be discharged home. In addition the patient was advised to bring with him his home medications to his next hemodialysis session  so that they may better determine if he is taking his medications appropriately. He agreed to follow-up with his PCP as advised and to comply with nephrologist request.  2. Thrombocytopenia- Chronic: Patient was observed to have thrombocytopenia on admission the platelet count of 76. Increased 80 prior to discharge. The patient was noted to be thrombocytopenic, however, he states this is not new and that he to follow-up with his primary care provider with regard to this lab finding.   3. AAA noted on abdominal CT in the ED The patient was observed to have a abdominal aortic aneurysm of 3.2 cm on abdominal CT secondary to complaint of abdominal pain, nausea, and emesis. Please follow-up with apropriate monitoring of the patients AAA. Radiology recommending a repeat scan in three years given the dimensions of the aneurysm.   4. Right sided loculated pleural effusion on X-ray The patient was noted to have a right-sided loculated pleural effusion on x-ray as an incidental finding. He denies shortness of breath, cough, or chest pain, and preferred to follow-up with his primary care provider as an outpatient with regard to this effusion. Recommend that the patient undergo CT evaluation of the loculation.   5. Vomiting, nausea, abdominal pain: The patient was originally admitted with severe nausea, vomiting, abdominal pain. This appeared to rapidly resolve with ondansetron and time. Patient was tolerating PO intake shortly after admission and continued to tolerate his breakfast prior to discharge. E was discharged home with continued complaints of vomiting, nausea or abdominal pain. This was most likely secondary to viral gastritis versus foodborne toxin ingestion.   Discharge Vitals:   BP (!) 164/114 (BP Location: Left Arm)   Pulse 80   Temp 98 F (36.7 C) (Oral)   Resp 20   Ht 6\' 3"  (1.905 m)   Wt 170 lb 8 oz (77.3 kg) Comment: a scale  SpO2 96%   BMI 21.31 kg/m   Pertinent Labs, Studies, and  Procedures:  CBC Latest Ref Rng & Units 07/15/2017 07/14/2017 12/14/2016  WBC 4.0 - 10.5 K/uL 8.8 8.2 10.4  Hemoglobin 13.0 - 17.0 g/dL 11.2(L) 11.1(L) 10.8(L)  Hematocrit 39.0 - 52.0 % 33.4(L) 32.4(L) 30.7(L)  Platelets 150 - 400 K/uL 80(L) 76(L) 161   CMP Latest Ref Rng & Units 07/15/2017 07/14/2017 12/14/2016  Glucose 65 - 99 mg/dL 106(H) 96 115(H)  BUN 6 - 20 mg/dL 49(H) 40(H) 66(H)  Creatinine 0.61 - 1.24 mg/dL 10.30(H) 8.55(H) 7.20(H)  Sodium 135 - 145 mmol/L 133(L) 136 135  Potassium 3.5 - 5.1 mmol/L 3.8 3.7 3.5  Chloride 101 - 111 mmol/L 93(L) 96(L) 92(L)  CO2 22 - 32 mmol/L 28 28 27   Calcium 8.9 - 10.3 mg/dL 8.6(L) 8.6(L) 9.6  Total Protein 6.5 - 8.1 g/dL 5.9(L) 5.4(L) 5.8(L)  Total  Bilirubin 0.3 - 1.2 mg/dL 0.9 0.8 0.8  Alkaline Phos 38 - 126 U/L 57 56 53  AST 15 - 41 U/L 21 20 24   ALT 17 - 63 U/L 11(L) 12(L) 21   Chest X-ray: IMPRESSION: No acute cardiopulmonary disease. Pleural based density over the lateral right thorax known to be fluid attenuation on CT likely loculated pleural fluid. Aortic Atherosclerosis (ICD10-I70.0).  CT abdomina and pelvis w/o contrast: IMPRESSION: No acute findings in the abdomen/pelvis. Subcentimeter liver hypodensity too small to characterize but likely cysts. Pleural based fluid density over the lateral right lower thorax likely loculated effusion as this is only partially visualized. Consider chest CT for further evaluation on an elective basis. Aortic Atherosclerosis (ICD10-I70.0). Aortic aneurysm NOS (ICD10-I71.9). 3.2 cm infrarenal abdominal aortic aneurysm. Recommend followup by ultrasound in 3 years. This recommendation follows ACR consensus guidelines: White Paper of the ACR Incidental Findings Committee II on Vascular Findings. J Am Coll Radiol 2013; 10:789-794.   Discharge Instructions: Discharge Instructions    Call MD for:  difficulty breathing, headache or visual disturbances    Complete by:  As directed    Call MD for:   persistant dizziness or light-headedness    Complete by:  As directed    Call MD for:  severe uncontrolled pain    Complete by:  As directed    Diet - low sodium heart healthy    Complete by:  As directed    Diet - low sodium heart healthy    Complete by:  As directed    Increase activity slowly    Complete by:  As directed    Increase activity slowly    Complete by:  As directed       Signed: Kathi Ludwig, MD 07/16/2017, 8:21 AM   Pager: Pager# [phone]

## 2017-07-15 NOTE — Progress Notes (Signed)
Paged Dr. Berneice Gandy updated with  pts elevated BP= 198/125 . No orders received.pt asymptomatic.

## 2017-07-15 NOTE — Progress Notes (Signed)
Pt discharge home. Discharge instructions given questions answered. Taken out via wheelchair.

## 2017-07-15 NOTE — Progress Notes (Signed)
   Subjective: Patient was lying comfortably in his bed today staring at the window upon entering the room. He stated he had not felt well yesterday and a such he had not watched any of the football games. The patient stated that he tolerated breakfast well, without issues, denied vomiting, or nausea. The patient reiterated history of presentation that initiated this hospital admission. He indicated that the nausea, vomiting and shortness of breath all resolved overnight. The patient was made aware of the plan to control his blood pressure to discharge him home with follow-up chest x-ray findings as an outpatient. He was in agreement with this plan.  Objective:  Vital signs in last 24 hours: Vitals:   07/15/17 0005 07/15/17 0636 07/15/17 0731 07/15/17 0953  BP: (!) 196/129 (!) 198/125 (!) 218/123 (!) 200/120  Pulse: 66 (!) 106 92   Resp: 16  20   Temp: 98.9 F (37.2 C) 98.4 F (36.9 C) 98 F (36.7 C)   TempSrc: Oral Oral Oral   SpO2: 99% 94% 96%   Weight: 170 lb 8 oz (77.3 kg)     Height: 6\' 3"  (1.905 m)      ROS negative except as per HPI.  Physical Exam Constitutional, non-distressed, appears well-nourished. HEENT, extraocular movements intact, Cardiovascular, normal rate, regular rhythm, no murmur auscultated Pulmonary, normal effort, normal breath sounds, absent wheezing or crackles Abdominal, soft, nontender, bowel sounds normal, Musculoskeletal, no tenderness or edema Neurological, alert and oriented Skin is warm and dry to touch Psych, Patient appears to have normal thought content abnormal mood and affect  Assessment/Plan:  Active Problems:   Hypertensive urgency  Severe Hypertension: -Continue amlodipine 10 mg, and hydralazine 100 mg TID -Restarted home labetalol 300 mg BID  -gave an additional dose of IV 10 mg labetalol today  Thrombocytopenia:   -Slightly increased today, appears stable, recommend that he continue to follow this with his PCP  ESRD on  dialysis T/Th/Sat:  -Follow up nephrology recommendations -patient says that they have session will suffice until his next scheduled on Tuesday, does not wish to have dialysis today  N/V and abdominal pain:  -resolved, tolerated breakfast well -most likely viral gastritis versus toxin ingestion from an infected food  Possible R loculated fluid collection:  -CT of chest on hold as [facility]  Abdominal aortic aneurysm: A 3.2 cm infrarenal AAA was also incidentally found on initial ED evaluation.  The radiologist recommended follow up scan in 3 years as an outpatient.  -the above information is consistent with medical guidance -Patient will be advised to follow-up with his PCP this finding.  Diet: Renal diet Fluids: none Code: full DVT ppx: SCD's while in bed, ambulate as tolerated Dispo: Anticipated discharge in approximately  0-1 day(s).   Kathi Ludwig, MD 07/15/2017, 11:06 AM Pager: Pager# [phone]

## 2017-07-15 NOTE — Discharge Instructions (Signed)
Please continue to take your medications as prescribed. If you have any questions please do not hesitate to call your primary care physician(PCP). It is essential that you control your blood pressure more effectively and this is best completed by strict medication compliance.   Please call your PCP to schedule an appointment with them for approximately one week from today. They will need to discuss the findings your your imaging concerning the abdominal aortic aneurysm and the incidental pulmonary effusion. We will send them a copy of your discharge summary to better assist them in the process.   Please bring all of your medications with you to the next hemodialysis session so that they may review them appropriately.  Thank you for your visit to [facility]. If any time you were to again experience concerning symptoms for free to return to the ED.
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
## 2017-07-15 NOTE — Progress Notes (Signed)
Pt just arrived on the floor. VS taken BP 196/129. No c/o headaches nor blurring of vision. no CP. Continued to observed. Tele on

## 2017-07-15 NOTE — Progress Notes (Signed)
Spoke to Rapid response fugio stated that pt is appropriate nto be admitted to 3e.

## 2017-07-16 DIAGNOSIS — N186 End stage renal disease: Secondary | ICD-10-CM

## 2017-07-16 DIAGNOSIS — Z992 Dependence on renal dialysis: Secondary | ICD-10-CM

## 2017-07-19 ENCOUNTER — Encounter (HOSPITAL_COMMUNITY): Payer: Self-pay | Admitting: Emergency Medicine

## 2017-07-19 ENCOUNTER — Emergency Department (HOSPITAL_COMMUNITY): Payer: Medicare Other

## 2017-07-19 ENCOUNTER — Observation Stay (HOSPITAL_COMMUNITY)
Admission: EM | Admit: 2017-07-19 | Discharge: 2017-07-20 | Disposition: A | Discharge status: Home / Self Care | Payer: Medicare Other | Attending: Internal Medicine | Admitting: Internal Medicine

## 2017-07-19 DIAGNOSIS — N186 End stage renal disease: Secondary | ICD-10-CM

## 2017-07-19 DIAGNOSIS — Z9114 Patient's other noncompliance with medication regimen: Secondary | ICD-10-CM

## 2017-07-19 DIAGNOSIS — J90 Pleural effusion, not elsewhere classified: Secondary | ICD-10-CM | POA: Diagnosis not present

## 2017-07-19 DIAGNOSIS — R109 Unspecified abdominal pain: Secondary | ICD-10-CM | POA: Insufficient documentation

## 2017-07-19 DIAGNOSIS — I161 Hypertensive emergency: Secondary | ICD-10-CM

## 2017-07-19 DIAGNOSIS — R112 Nausea with vomiting, unspecified: Secondary | ICD-10-CM | POA: Diagnosis not present

## 2017-07-19 DIAGNOSIS — D509 Iron deficiency anemia, unspecified: Secondary | ICD-10-CM | POA: Insufficient documentation

## 2017-07-19 DIAGNOSIS — I714 Abdominal aortic aneurysm, without rupture: Secondary | ICD-10-CM | POA: Diagnosis not present

## 2017-07-19 DIAGNOSIS — X58XXXA Exposure to other specified factors, initial encounter: Secondary | ICD-10-CM | POA: Insufficient documentation

## 2017-07-19 DIAGNOSIS — A084 Viral intestinal infection, unspecified: Secondary | ICD-10-CM

## 2017-07-19 DIAGNOSIS — N179 Acute kidney failure, unspecified: Secondary | ICD-10-CM | POA: Insufficient documentation

## 2017-07-19 DIAGNOSIS — I160 Hypertensive urgency: Secondary | ICD-10-CM | POA: Diagnosis present

## 2017-07-19 DIAGNOSIS — T8242XA Displacement of vascular dialysis catheter, initial encounter: Secondary | ICD-10-CM | POA: Insufficient documentation

## 2017-07-19 DIAGNOSIS — N2581 Secondary hyperparathyroidism of renal origin: Secondary | ICD-10-CM | POA: Diagnosis not present

## 2017-07-19 DIAGNOSIS — N189 Chronic kidney disease, unspecified: Secondary | ICD-10-CM

## 2017-07-19 DIAGNOSIS — Z79899 Other long term (current) drug therapy: Secondary | ICD-10-CM | POA: Insufficient documentation

## 2017-07-19 DIAGNOSIS — Z8249 Family history of ischemic heart disease and other diseases of the circulatory system: Secondary | ICD-10-CM | POA: Diagnosis not present

## 2017-07-19 DIAGNOSIS — T82898A Other specified complication of vascular prosthetic devices, implants and grafts, initial encounter: Secondary | ICD-10-CM

## 2017-07-19 DIAGNOSIS — H409 Unspecified glaucoma: Secondary | ICD-10-CM | POA: Diagnosis not present

## 2017-07-19 DIAGNOSIS — D631 Anemia in chronic kidney disease: Secondary | ICD-10-CM

## 2017-07-19 DIAGNOSIS — Z8673 Personal history of transient ischemic attack (TIA), and cerebral infarction without residual deficits: Secondary | ICD-10-CM

## 2017-07-19 DIAGNOSIS — Z91048 Other nonmedicinal substance allergy status: Secondary | ICD-10-CM

## 2017-07-19 DIAGNOSIS — T8241XA Breakdown (mechanical) of vascular dialysis catheter, initial encounter: Secondary | ICD-10-CM | POA: Diagnosis not present

## 2017-07-19 DIAGNOSIS — K5710 Diverticulosis of small intestine without perforation or abscess without bleeding: Secondary | ICD-10-CM | POA: Diagnosis not present

## 2017-07-19 DIAGNOSIS — B1920 Unspecified viral hepatitis C without hepatic coma: Secondary | ICD-10-CM | POA: Insufficient documentation

## 2017-07-19 DIAGNOSIS — I120 Hypertensive chronic kidney disease with stage 5 chronic kidney disease or end stage renal disease: Principal | ICD-10-CM | POA: Insufficient documentation

## 2017-07-19 DIAGNOSIS — Z992 Dependence on renal dialysis: Secondary | ICD-10-CM | POA: Insufficient documentation

## 2017-07-19 DIAGNOSIS — F17210 Nicotine dependence, cigarettes, uncomplicated: Secondary | ICD-10-CM

## 2017-07-19 LAB — COMPREHENSIVE METABOLIC PANEL
ALBUMIN: 3.1 g/dL — AB (ref 3.5–5.0)
ALK PHOS: 66 U/L (ref 38–126)
ALT: 11 U/L — AB (ref 17–63)
ANION GAP: 13 (ref 5–15)
AST: 20 U/L (ref 15–41)
BUN: 76 mg/dL — ABNORMAL HIGH (ref 6–20)
CALCIUM: 8.3 mg/dL — AB (ref 8.9–10.3)
CHLORIDE: 97 mmol/L — AB (ref 101–111)
CO2: 24 mmol/L (ref 22–32)
CREATININE: 15.82 mg/dL — AB (ref 0.61–1.24)
GFR calc Af Amer: 3 mL/min — ABNORMAL LOW (ref >60)
GFR calc non Af Amer: 3 mL/min — ABNORMAL LOW (ref >60)
GLUCOSE: 137 mg/dL — AB (ref 65–99)
Potassium: 4.2 mmol/L (ref 3.5–5.1)
SODIUM: 134 mmol/L — AB (ref 135–145)
Total Bilirubin: 0.7 mg/dL (ref 0.3–1.2)
Total Protein: 5.9 g/dL — ABNORMAL LOW (ref 6.5–8.1)

## 2017-07-19 LAB — CBC
HCT: 26.5 % — ABNORMAL LOW (ref 39.0–52.0)
HEMOGLOBIN: 8.8 g/dL — AB (ref 13.0–17.0)
MCH: 29.9 pg (ref 26.0–34.0)
MCHC: 33.2 g/dL (ref 30.0–36.0)
MCV: 90.1 fL (ref 78.0–100.0)
Platelets: 153 10*3/uL (ref 150–400)
RBC: 2.94 MIL/uL — ABNORMAL LOW (ref 4.22–5.81)
RDW: 17.1 % — ABNORMAL HIGH (ref 11.5–15.5)
WBC: 7.1 10*3/uL (ref 4.0–10.5)

## 2017-07-19 LAB — LIPASE, BLOOD: Lipase: 40 U/L (ref 11–51)

## 2017-07-19 MED ORDER — PENTAFLUOROPROP-TETRAFLUOROETH EX AERO
1.0000 "application " | INHALATION_SPRAY | CUTANEOUS | Status: DC | PRN
  Filled 2017-07-19: qty 30

## 2017-07-19 MED ORDER — IOPAMIDOL (ISOVUE-300) INJECTION 61%
INTRAVENOUS | Status: AC
  Administered 2017-07-19: 100 mL
  Filled 2017-07-19: qty 100

## 2017-07-19 MED ORDER — AMLODIPINE BESYLATE 5 MG PO TABS
10.0000 mg | ORAL_TABLET | Freq: Every day | ORAL | Status: DC
  Filled 2017-07-19: qty 2

## 2017-07-19 MED ORDER — METRONIDAZOLE IN NACL 5-0.79 MG/ML-% IV SOLN: 500.0000 mg | Freq: Once | INTRAVENOUS | Status: DC

## 2017-07-19 MED ORDER — SODIUM CHLORIDE 0.9 % IV SOLN: 100.0000 mL | INTRAVENOUS | Status: DC | PRN

## 2017-07-19 MED ORDER — CIPROFLOXACIN IN D5W 400 MG/200ML IV SOLN: 400.0000 mg | Freq: Once | INTRAVENOUS | Status: DC

## 2017-07-19 MED ORDER — HYDRALAZINE HCL 50 MG PO TABS
100.0000 mg | ORAL_TABLET | Freq: Three times a day (TID) | ORAL | Status: DC
  Filled 2017-07-19: qty 2

## 2017-07-19 MED ORDER — LABETALOL HCL 200 MG PO TABS
300.0000 mg | ORAL_TABLET | Freq: Two times a day (BID) | ORAL | Status: DC
  Filled 2017-07-19: qty 1.5

## 2017-07-19 MED ORDER — LABETALOL HCL 5 MG/ML IV SOLN
10.0000 mg | Freq: Once | INTRAVENOUS | Status: AC
  Administered 2017-07-20: 10 mg via INTRAVENOUS
  Filled 2017-07-19: qty 4

## 2017-07-19 MED ORDER — CALCITRIOL 0.25 MCG PO CAPS
0.2500 ug | ORAL_CAPSULE | ORAL | Status: DC
  Filled 2017-07-19: qty 1

## 2017-07-19 MED ORDER — LIDOCAINE HCL (PF) 1 % IJ SOLN: 5.0000 mL | INTRAMUSCULAR | Status: DC | PRN

## 2017-07-19 MED ORDER — LIDOCAINE-PRILOCAINE 2.5-2.5 % EX CREA
1.0000 "application " | TOPICAL_CREAM | CUTANEOUS | Status: DC | PRN
  Filled 2017-07-19: qty 5

## 2017-07-19 MED ORDER — HYDRALAZINE HCL 25 MG PO TABS
100.0000 mg | ORAL_TABLET | Freq: Three times a day (TID) | ORAL | Status: DC
  Administered 2017-07-20 (×3): 100 mg via ORAL
  Filled 2017-07-19 (×2): qty 4

## 2017-07-19 NOTE — ED Triage Notes (Signed)
Pt reports he was discharged last week for vomiting, went to dialysis today but did not get his dialysis done due to issue with catheter, states his vomiting has been ongoing since he left the hospital. Pt a/ox4, resp e/u, nad.

## 2017-07-19 NOTE — ED Notes (Signed)
Patient transported to CT 

## 2017-07-19 NOTE — ED Notes (Signed)
Pt is going to dialysis

## 2017-07-19 NOTE — ED Notes (Signed)
Pt still in dialysis 
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
## 2017-07-19 NOTE — ED Triage Notes (Signed)
Pt presents to the ed with ems from dialysis for complaints of something being wrong with his portacath to where they could not do his dialysis today, he also has been generally weak x 3 days.

## 2017-07-19 NOTE — Consult Note (Signed)
[facility] Progress Note   Dialysis Orders: [facility] 4 hr EDW 78 2 K 2.25 Ca 400/600 heparin 2000 calcitriol 0.25, Mircera 30 q 2 wk last 9./20 right IJ - referred to [facility] for access Recent labs: hgb 10.8 25% sat ferritin 588 iPTH 509 P 5.9   Assessment/Plan: 1. Dialysis catheter malfunction- rec check CXR for catheter placement and if ok, can dialyze tonight and then be replaced tomorrow. Can drawn labs on dialysis.  IR recently replaced ~9/18 at [facility].  He is high risk for catheter sepsis due to arriving twice at dialysis without a dressing and that he told staff he is doing his own dressing change.  Seems to have had a skin reaction to dressing material.  Rec empiric BC and Vanc and Fortaz coverage since he has some watery drainage and an exposed catheter cuff. Have made NPO after midnight for catheter replacement - if we keep on the right, will need to be retunneled.  CAth ok to use if secured 2. Uncontrolled hypertension - apparently only taking labetalol, hydralazine/amlodipine; claims he cannot take lisinopril- titrate volume down as much as possible. Claims to take labetalol and hydralazine pre HD at 10 am. Patient did bring in his BP meds 9/25 for review by his outpatient HD RN-  Rx was filled 8/16 and he still had pills in his bottle and pill organizer.  He was out of labetolol.  Health literacy is an issue. Severe nonadherence, literacy is not an issue with that.  S/O most tx and shortend.  Has PND, orthopnea, and severe vol xs. will need back to back HD to control vol and HTN.    2. ESRD -TTS - HD today; catheter placement is ok Uremic, Sarajane Jews from nonadherence.  Needs perm access .  3. Anemia - not due for ESA 4. Secondary hyperparathyroidism - continue calcitriol/renvela 3 ac 5. Nutrition - needs renal diet 6. Patch test on skin + for chlorhexidine allergy 06/26/17 - at outpatient dialysis unit- added to allergies 7. Severe nonadherence one of primary  issues  Myriam Jacobson, PA-C [place] [phone] 07/19/2017,2:58 PM  LOS: 0 days  I have seen and examined this patient and agree with the plan of care seen, eval, examined, discussed with PA and counseled patient. .  Kailee Essman L 07/19/2017, 5:44 PM   Subjective:   Drove self to dialysis. Arrived too weak to get out of the truck, tearful and SOB. Doing own dressing changes after leaving dialysis. States dressing comes off during the night Watery drainage/itching at skin around catheter.  C/o being cold. Dialysis staff said he was 4 kg above EDW.  The dialysis RN arranged transport to [facility] ED for evaluation and treatment Recurrent shortened tx, Needs perm access.   Objective Vitals:   07/19/17 1310  BP: (!) 171/115  Pulse: 84  Resp: 18  Temp: 97.7 F (36.5 C)  TempSrc: Oral  SpO2: 98%   Physical Exam Patient seen in Triage - sent to [facility] ED via ambulance from his [facility] General: slender AAM NAD - breathing ok on room air Heart: RRR LV lift GR 2/6 M Lungs: crackles at bases decreased bs Abdomen: soft Liver down 6 cm and tender. Extremities:LE edema 1+ Dialysis Access: right IJ [facility] cath exposed, watery drainage- skin changes oaround catheter exit site HEENT upper plate, Fundi HTN changes with exudates, silver wiring, AV nicking   Additional Objective Labs: Basic Metabolic Panel:  Recent Labs Lab 07/14/17 1615 07/15/17  0524  NA 136 133*  K 3.7 3.8  CL 96* 93*  CO2 28 28  GLUCOSE 96 106*  BUN 40* 49*  CREATININE 8.55* 10.30*  CALCIUM 8.6* 8.6*   Liver Function Tests:  Recent Labs Lab 07/14/17 1615 07/15/17 0524  AST 20 21  ALT 12* 11*  ALKPHOS 56 57  BILITOT 0.8 0.9  PROT 5.4* 5.9*  ALBUMIN 2.8* 3.0*    Recent Labs Lab 07/14/17 1759  LIPASE 35   CBC:  Recent Labs Lab 07/14/17 1615 07/15/17 0524  WBC 8.2 8.8  NEUTROABS 4.9 5.4  HGB 11.1* 11.2*  HCT 32.4* 33.4*  MCV 88.3 88.8  PLT 76* 80*   Blood  Culture    Component Value Date/Time   SDES URINE, CATHETERIZED 12/11/2016 0545   SPECREQUEST NONE 12/11/2016 0545   CULT NO GROWTH 12/11/2016 0545   REPTSTATUS 12/12/2016 FINAL 12/11/2016 0545    Cardiac Enzymes: No results for input(s): CKTOTAL, CKMB, CKMBINDEX, TROPONINI in the last 168 hours. CBG: No results for input(s): GLUCAP in the last 168 hours. Iron Studies: No results for input(s): IRON, TIBC, TRANSFERRIN, FERRITIN in the last 72 hours. Lab Results  Component Value Date   INR 1.12 07/15/2017   INR 1.03 12/10/2016   Studies/Results: No results found. Medications:
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
## 2017-07-20 ENCOUNTER — Inpatient Hospital Stay (HOSPITAL_COMMUNITY): Payer: Medicare Other

## 2017-07-20 ENCOUNTER — Encounter (HOSPITAL_COMMUNITY): Payer: Self-pay | Admitting: *Deleted

## 2017-07-20 DIAGNOSIS — I714 Abdominal aortic aneurysm, without rupture: Secondary | ICD-10-CM | POA: Diagnosis not present

## 2017-07-20 DIAGNOSIS — D509 Iron deficiency anemia, unspecified: Secondary | ICD-10-CM | POA: Diagnosis not present

## 2017-07-20 DIAGNOSIS — J90 Pleural effusion, not elsewhere classified: Secondary | ICD-10-CM

## 2017-07-20 DIAGNOSIS — N186 End stage renal disease: Secondary | ICD-10-CM | POA: Diagnosis not present

## 2017-07-20 DIAGNOSIS — Z888 Allergy status to other drugs, medicaments and biological substances status: Secondary | ICD-10-CM | POA: Diagnosis not present

## 2017-07-20 DIAGNOSIS — Z992 Dependence on renal dialysis: Secondary | ICD-10-CM | POA: Diagnosis not present

## 2017-07-20 DIAGNOSIS — I120 Hypertensive chronic kidney disease with stage 5 chronic kidney disease or end stage renal disease: Principal | ICD-10-CM

## 2017-07-20 DIAGNOSIS — N179 Acute kidney failure, unspecified: Secondary | ICD-10-CM

## 2017-07-20 DIAGNOSIS — N189 Chronic kidney disease, unspecified: Secondary | ICD-10-CM

## 2017-07-20 DIAGNOSIS — I160 Hypertensive urgency: Secondary | ICD-10-CM

## 2017-07-20 DIAGNOSIS — R748 Abnormal levels of other serum enzymes: Secondary | ICD-10-CM | POA: Diagnosis not present

## 2017-07-20 HISTORY — PX: IR FLUORO GUIDE CV LINE RIGHT: IMG2283

## 2017-07-20 LAB — IRON AND TIBC
IRON: 47 ug/dL (ref 45–182)
Saturation Ratios: 16 % — ABNORMAL LOW (ref 17.9–39.5)
TIBC: 302 ug/dL (ref 250–450)
UIBC: 255 ug/dL

## 2017-07-20 LAB — CBC
HCT: 25.6 % — ABNORMAL LOW (ref 39.0–52.0)
HEMOGLOBIN: 8.6 g/dL — AB (ref 13.0–17.0)
MCH: 29.9 pg (ref 26.0–34.0)
MCHC: 33.6 g/dL (ref 30.0–36.0)
MCV: 88.9 fL (ref 78.0–100.0)
PLATELETS: 165 10*3/uL (ref 150–400)
RBC: 2.88 MIL/uL — AB (ref 4.22–5.81)
RDW: 17 % — ABNORMAL HIGH (ref 11.5–15.5)
WBC: 6.6 10*3/uL (ref 4.0–10.5)

## 2017-07-20 LAB — BASIC METABOLIC PANEL
ANION GAP: 11 (ref 5–15)
BUN: 22 mg/dL — ABNORMAL HIGH (ref 6–20)
CALCIUM: 8.1 mg/dL — AB (ref 8.9–10.3)
CO2: 25 mmol/L (ref 22–32)
CREATININE: 5.97 mg/dL — AB (ref 0.61–1.24)
Chloride: 98 mmol/L — ABNORMAL LOW (ref 101–111)
GFR, EST AFRICAN AMERICAN: 11 mL/min — AB (ref >60)
GFR, EST NON AFRICAN AMERICAN: 9 mL/min — AB (ref >60)
Glucose, Bld: 99 mg/dL (ref 65–99)
Potassium: 3.1 mmol/L — ABNORMAL LOW (ref 3.5–5.1)
SODIUM: 134 mmol/L — AB (ref 135–145)

## 2017-07-20 LAB — FERRITIN: FERRITIN: 360 ng/mL — AB (ref 24–336)

## 2017-07-20 LAB — TROPONIN I
TROPONIN I: 0.1 ng/mL — AB (ref <0.03)
Troponin I: 0.1 ng/mL (ref <0.03)

## 2017-07-20 LAB — MRSA PCR SCREENING: MRSA by PCR: NEGATIVE

## 2017-07-20 MED ORDER — AMLODIPINE BESYLATE 10 MG PO TABS: 10.0000 mg | ORAL_TABLET | Freq: Every day | ORAL | Status: DC

## 2017-07-20 MED ORDER — VANCOMYCIN HCL 10 G IV SOLR
1750.0000 mg | Freq: Once | INTRAVENOUS | Status: AC
  Administered 2017-07-20: 1750 mg via INTRAVENOUS
  Filled 2017-07-20: qty 1750

## 2017-07-20 MED ORDER — LIDOCAINE HCL 1 % IJ SOLN
INTRAMUSCULAR | Status: AC
  Filled 2017-07-20: qty 20

## 2017-07-20 MED ORDER — CEFAZOLIN SODIUM-DEXTROSE 2-4 GM/100ML-% IV SOLN
INTRAVENOUS | Status: AC
  Administered 2017-07-20: 2 g via INTRAVENOUS
  Filled 2017-07-20: qty 100

## 2017-07-20 MED ORDER — CHLORHEXIDINE GLUCONATE 4 % EX LIQD
CUTANEOUS | Status: AC
  Filled 2017-07-20: qty 15

## 2017-07-20 MED ORDER — LABETALOL HCL 300 MG PO TABS
300.0000 mg | ORAL_TABLET | Freq: Two times a day (BID) | ORAL | Status: DC
  Administered 2017-07-20: 300 mg via ORAL
  Filled 2017-07-20: qty 1

## 2017-07-20 MED ORDER — HEPARIN SODIUM (PORCINE) 1000 UNIT/ML IJ SOLN
INTRAMUSCULAR | Status: AC
  Administered 2017-07-20: 3.8 mL
  Filled 2017-07-20: qty 1

## 2017-07-20 MED ORDER — LISINOPRIL 40 MG PO TABS
40.0000 mg | ORAL_TABLET | Freq: Every day | ORAL | Status: DC
  Administered 2017-07-20 (×2): 40 mg via ORAL
  Filled 2017-07-20 (×2): qty 1

## 2017-07-20 MED ORDER — LIDOCAINE HCL (PF) 1 % IJ SOLN
INTRAMUSCULAR | Status: DC | PRN
  Administered 2017-07-20: 5 mL

## 2017-07-20 MED ORDER — DEXTROSE 5 % IV SOLN
1.0000 g | Freq: Once | INTRAVENOUS | Status: AC
  Administered 2017-07-20: 1 g via INTRAVENOUS
  Filled 2017-07-20 (×2): qty 1

## 2017-07-20 MED ORDER — CEFAZOLIN SODIUM-DEXTROSE 2-4 GM/100ML-% IV SOLN
2.0000 g | Freq: Once | INTRAVENOUS | Status: AC
  Administered 2017-07-20: 2 g via INTRAVENOUS
  Filled 2017-07-20: qty 100

## 2017-07-20 NOTE — Progress Notes (Signed)
Pt back from IR. HD cath on right side- dressed with guaze by IR. Denies any pain.

## 2017-07-20 NOTE — Discharge Instructions (Signed)
Please be sure to call your primary care office to schedule an appointment within one week.   Please continue to care for your catheter site with appropriate cleaning and bandage replacement.   Please follow-up at your [facility] for fistula replacement as you were hospitalized today and unable to have it placed.  Please continue to take your blood pressure medications as scheduled to prevent severe hypertension which presents with nausea, vomiting and pain.  If at any time you were to develop additional symptoms, or worsening symptoms, please call your PCP or return to the ED if they are unavailable.   Thank you for visiting [facility] hospital.

## 2017-07-20 NOTE — Discharge Summary (Signed)
Name: Frank Fuller MRN: [id number] DOB: [birth year] 56 y.o. PCP: Cathlyn Parsons, PA-C  Date of Admission: 07/19/2017  4:52 PM Date of Discharge: 07/20/2017 Attending Physician: Sid Falcon, MD  Discharge Diagnosis: Principal Problem:   ESRD needing dialysis [facility]) Active Problems:   Hypertensive urgency   Acute kidney injury superimposed on chronic kidney disease [facility])   Discharge Medications: Allergies as of 07/20/2017      Reactions   Chlorhexidine    Patch skin test done at dialysis 06/26/17  - staff using clear dressing and alcohol to clean exit site of catheter      Medication List    STOP taking these medications   lisinopril 40 MG tablet Commonly known as:  PRINIVIL,ZESTRIL     TAKE these medications   amLODipine 10 MG tablet Commonly known as:  NORVASC Take 10 mg by mouth at bedtime.   ANALGESIC EX Apply 1 application topically 2 (two) times daily as needed (pain).   hydrALAZINE 100 MG tablet Commonly known as:  APRESOLINE Take 100 mg by mouth 3 (three) times daily. Hold for systolic blood pressure 409/81   labetalol 300 MG tablet Commonly known as:  NORMODYNE TAKE 1 TABLET (300MG ) BY MOUTH TWICE DAILY            Discharge Care Instructions        Start     Ordered   07/20/17 0000  Increase activity slowly     07/20/17 1417   07/20/17 0000  Diet - low sodium heart healthy     07/20/17 1417   07/20/17 0000  Call MD for:  severe uncontrolled pain     07/20/17 1417      Disposition and follow-up:   Frank Fuller was discharged from [facility] in Good condition.  At the hospital follow up visit please address:  1.  A. Catheter site care      B. Treatment of his underlying severe hypertension, true lisinopril reaction      C. [facility]      D. AAA will need to be followed      E. Will need outpatient follow-up for incidental lung finding, see below x-ray impression      F. Please  continue the patients vancomycin and ceftazidime following his 24 hours dose 07/20/17.  2.  Labs / imaging needed at time of follow-up: CT-chest  3.  Pending labs/ test needing follow-up: n/a  Follow-up Appointments: Follow-up Information    Cathlyn Parsons, PA-C Follow up.   Specialty:  Physician Assistant Contact information: [place] [address] [phone]           Hospital Course by problem list: Principal Problem:   ESRD needing dialysis [facility]) Active Problems:   Hypertensive urgency   Acute kidney injury superimposed on chronic kidney disease ([place])   1. ESRD in need of dialysis with displaced catheter Patient was sent to the ED from his dialysis center for catheter replacement as his was observed to be dislodged. It was used for HD the first night of his stay. Nephrology was consulted who requested IR perform the procedure. IR placed a right-sided IJ catheter where the previous catheter have been placed. As the patient was scheduled for hemodialysis the following day nephrology was in agreement with him being discharged home. He was discharged home in stable condition to [facility]  2. AAA noted  on abdominal CT in the ED The patient was observed to have an abdominal aortic aneurysm of 3.2 cm on abdominal CT secondary to complaint of abdominal pain, nausea, and emesis on his prior admission. Please follow-up with apropriate monitoring of the patients AAA. Radiology recommending a repeat scan in three years given the dimensions of the aneurysm.   3. Right sided loculated pleural effusion on X-ray The patient was noted to have a right-sided loculated pleural effusion on x-ray as an incidental finding which was labeled as a stable ventral fluid pseudotumor on the right on a repeat chest X-ray. He denies shortness of breath, cough, or chest pain, and again preferred to follow-up  with his primary care provider as an outpatient with regard to this effusion. Recommend that the patient undergo CT evaluation of the loculation at the [place], where he receives the majority of his care.  4. Nausea, abdominal pain: The patient was originally admitted with severe nausea, and abdominal pain. This appeared to rapidly resolve without treatment following better blood pressure control. Patient was tolerating PO intake following his catheter replacement. He was discharged home with continued complaints of vomiting, nausea or abdominal pain. This was most likely secondary to viral gastritis versus foodborne toxin ingestion or severe hypertension. Patient received one 24 hours dose of vancomycin and one 24 hours dose of ceftazidime prior to discharge. He will need to continue receiving both for a total of five days.  5. Iron deficiency anemia/ elevated troponin Anemia was noted on emergency department evaluation. It was evaluated and determined to be of Iron deficiency with ferritin 360, iron 47 and TIBC 302. Please discuss these findings with the patient and his nephrologist. There have been no signs of blood loss. The anemia is most likely secondary to his chronic kidney disease.  Patient was noted to have a slightly elevated troponin of 0.10 which was 0.10 again 6 hours later and attributed    Discharge Vitals:   BP (!) 173/107 (BP Location: Right Arm) Comment: RN notified  Pulse 87   Temp 97.9 F (36.6 C) (Oral)   Resp 18   Ht 6' 3.5" (1.918 m)   Wt 175 lb (79.4 kg)   SpO2 100%   BMI 21.58 kg/m   Pertinent Labs, Studies, and Procedures:  CBC Latest Ref Rng & Units 07/20/2017 07/19/2017 07/15/2017  WBC 4.0 - 10.5 K/uL 6.6 7.1 8.8  Hemoglobin 13.0 - 17.0 g/dL 8.6(L) 8.8(L) 11.2(L)  Hematocrit 39.0 - 52.0 % 25.6(L) 26.5(L) 33.4(L)  Platelets 150 - 400 K/uL 165 153 80(L)   CMP Latest Ref Rng & Units 07/20/2017 07/19/2017 07/15/2017  Glucose 65 - 99 mg/dL 99 137(H) 106(H)  BUN 6 - 20  mg/dL 22(H) 76(H) 49(H)  Creatinine 0.61 - 1.24 mg/dL 5.97(H) 15.82(H) 10.30(H)  Sodium 135 - 145 mmol/L 134(L) 134(L) 133(L)  Potassium 3.5 - 5.1 mmol/L 3.1(L) 4.2 3.8  Chloride 101 - 111 mmol/L 98(L) 97(L) 93(L)  CO2 22 - 32 mmol/L 25 24 28   Calcium 8.9 - 10.3 mg/dL 8.1(L) 8.3(L) 8.6(L)  Total Protein 6.5 - 8.1 g/dL - 5.9(L) 5.9(L)  Total Bilirubin 0.3 - 1.2 mg/dL - 0.7 0.9  Alkaline Phos 38 - 126 U/L - 66 57  AST 15 - 41 U/L - 20 21  ALT 17 - 63 U/L - 11(L) 11(L)   Chest X-ray IMPRESSION: 1.  Dual-lumen right IJ catheter stable position. 2. Stable cardiomegaly. Stable ventral fluid pseudotumor right chest. No acute cardiopulmonary disease Elliptical density again noted over  the right chest again consistent fissural fluid pseudotumor  Discharge Instructions: Discharge Instructions    Call MD for:  severe uncontrolled pain    Complete by:  As directed    Diet - low sodium heart healthy    Complete by:  As directed    Increase activity slowly    Complete by:  As directed       Signed: Kathi Ludwig, MD 07/20/2017, 2:17 PM   Pager: Pager# [phone]
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
## 2017-07-20 NOTE — Progress Notes (Signed)
New Admission Note:  Arrival Method: Stretcher Mental Orientation: Alert and oriented x 4  Telemetry: Box 04 ST   Assessment: Completed Skin: Warm and dry   IV: NSL  Pain: Denies Tubes: N/A Safety Measures: Safety Fall Prevention Plan initiated.  Admission: Completed 2 [place] Orientation: Patient has been orientated to the room, unit and the staff. Family:  Orders have been reviewed and implemented. Will continue to monitor the patient. Call light has been placed within reach and bed alarm has been activated.   Sima Matas BSN, RN  Phone Number: [phone]

## 2017-07-20 NOTE — Progress Notes (Signed)
Pharmacy Antibiotic Note  Frank Fuller is a 56 y.o. male admitted on 07/19/2017 with possible catheter infection.  Pharmacy has been consulted for vancomycin dosing.  Patient is ESRD on HD. Last had HD on 9/27 PM (4.15h @ BFR 400). Had catheter exchanged today with plan for him to receive antibiotics and then discharge and continue PTA HD schedule of TTSat.  Plan: Ceftazidime 1g IV x1 ordered by MD Vancomycin 1750mg  IV x1 to be given on the floor prior to discharge  Recommend vancomycin 750mg  IV qHD-TTSat and ceftazidime 2g IV qHD-TTSat as an outpatient regimen. Renal note states patient is to receive 5 more doses. These maintenance doses have NOT been entered as consult received as imminent discharge  Height: 6' 3.5" (191.8 cm) Weight: 175 lb (79.4 kg) IBW/kg (Calculated) : 85.65  Temp (24hrs), Avg:98.2 F (36.8 C), Min:97.7 F (36.5 C), Max:98.7 F (37.1 C)   Recent Labs Lab 07/14/17 1615 07/15/17 0524 07/19/17 1614 07/20/17 0137  WBC 8.2 8.8 7.1 6.6  CREATININE 8.55* 10.30* 15.82* 5.97*    Estimated Creatinine Clearance: 15.1 mL/min (A) (by C-G formula based on SCr of 5.97 mg/dL (H)).    Allergies  Allergen Reactions  . Chlorhexidine     Patch skin test done at dialysis 06/26/17  - staff using clear dressing and alcohol to clean exit site of catheter    Antimicrobials this admission: Ceftazidime 9/28 >>  Vancomycin 9/28 >>   Dose adjustments this admission: n/a  Microbiology results: 9/27 BCx: ngtd  9/28 MRSA PCR: neg  Thank you for allowing pharmacy to be a part of this patient's care.  Ketzaly Cardella D. Patty Leitzke, PharmD, BCPS Clinical Pharmacist Pager: [phone] Clinical Phone for 07/20/2017 until 3:30pm: [phone] If after 3:30pm, please call main pharmacy at [phone] 07/20/2017 3:11 PM

## 2017-07-20 NOTE — Progress Notes (Signed)
   Subjective: Frank Fuller was lying in bed upon entering the room today watching television. He stated that he would like to have this procedure done as soon as possible so that he may eat. In addition he would like to know when he may be able to be discharged home. The patient was informed that IR was planning to the procedure hopefully today and following the procedure will be discussed with nephrology as to this disposition and discharge plan. Patient denied headache, residual changes, nausea, vomiting, chest pain, abdominal pain, dysuria, muscle aches, fever or chills  Objective:  Vital signs in last 24 hours: Vitals:   07/20/17 0056 07/20/17 0136 07/20/17 0358 07/20/17 0502  BP: (!) 166/116 (!) 200/184 (!) 157/107   Pulse: 85 94 89 94  Resp: 12 19  16   Temp: 98.7 F (37.1 C) 98.3 F (36.8 C)  98.7 F (37.1 C)  TempSrc: Oral     SpO2: 100% 98%  99%  Weight: 173 lb 8 oz (78.7 kg) 175 lb (79.4 kg)    Height:  6' 3.5" (1.918 m)     ROS negative except as per HPI.  Physical Exam  Nursing note and vitals reviewed. constitutional: Patient per orders well-developed, in no acute distress at this time Cardiovascular: Regular rate and rhythm, no murmur auscultated with intact distal pulses Right IJ catheter site remains clean without drainage currently Pulmonary: Regular effort, by basilar crackles Abdominal: Soft. Nondistended, nontender, with bowel sounds Musculoskeletal, no edema,tenderness Neurological: Patient is alert and oriented Psychiatric: Mood is normal, normal affect   Assessment/Plan:  Principal Problem:   ESRD needing dialysis ([place]) Active Problems:   Hypertensive urgency   Acute kidney injury superimposed on chronic kidney disease ([place])  ESRD  -Patient was diuresed 07/19/17, post dialysis the patient's cr=5.97 -Nephrology following as he is their patient -Blood cultures pending -NPO for catheter replacement -May need to consult vascular for permanent access,  was to have access placed at [facility] today, will defer to [facility] for such -culture line after removal  Hypertensive Emergency The patient was previously on lisinopril but it was stopped. Patient stated that he gets nauseous when taking lisinopril which he was recently started on -labetalol 300mg  BID -amlodipine 10 mg qd -hydralazine 100mg  tid -Put patient in a quiet dark room which has been shown to drop bp >20/7mmHg  Abdominal Pain Small Bowel Enteritis -the patient stated that his abdominal pain had resolved.  -empiric ciprofloxacin and metronidazole not continued on the floor  Anemia of Chronic Disease  The patient's hb=8.8, mcv=90 which is decreased from his prior hb=11.2 in (11/2016). -Iron studies ordered: fe=47, tibc=302, saturation ratio=16% which is consistent with iron deficiency anemia -will defer to nephrology   Diet: renal Fluids: Code: Full Dvt ppx: NONE ordered??? Dispo: Anticipated discharge in approximately 1-2 day(s).   Kathi Ludwig, MD 07/20/2017, 6:57 AM Pager: Pager# [phone]

## 2017-07-20 NOTE — Procedures (Signed)
Interventional Radiology Procedure Note  Procedure: Exchange of tunneled hemodialysis catheter  Complications: None  Estimated Blood Loss: < 10 mL  Pre-existing catheter removed over wire and new 23 cm tip to cuff length Palindrome catheter advanced.  Tip in RA.  OK to use.  Venetia Night. Kathlene Cote, M.D Pager:  [phone]

## 2017-07-21 LAB — HEPATITIS B SURFACE ANTIGEN: HEP B S AG: NEGATIVE

## 2017-07-21 LAB — HEPATITIS B SURFACE ANTIBODY,QUALITATIVE: Hep B S Ab: NONREACTIVE

## 2017-07-21 LAB — HEPATITIS B CORE ANTIBODY, TOTAL: Hep B Core Total Ab: POSITIVE — AB

## 2017-07-24 LAB — CULTURE, BLOOD (ROUTINE X 2)
CULTURE: NO GROWTH
CULTURE: NO GROWTH
Special Requests: ADEQUATE

## 2017-08-02 ENCOUNTER — Encounter (HOSPITAL_COMMUNITY): Payer: Self-pay

## 2017-08-02 ENCOUNTER — Emergency Department (HOSPITAL_COMMUNITY): Payer: Medicare Other

## 2017-08-02 ENCOUNTER — Inpatient Hospital Stay (HOSPITAL_COMMUNITY)
Admission: EM | Admit: 2017-08-02 | Discharge: 2017-08-04 | DRG: 291 | Disposition: A | Discharge status: Home / Self Care | Payer: Medicare Other | Attending: Oncology | Admitting: Oncology

## 2017-08-02 ENCOUNTER — Other Ambulatory Visit: Payer: Self-pay

## 2017-08-02 DIAGNOSIS — Z9111 Patient's noncompliance with dietary regimen: Secondary | ICD-10-CM | POA: Diagnosis not present

## 2017-08-02 DIAGNOSIS — N186 End stage renal disease: Secondary | ICD-10-CM | POA: Diagnosis present

## 2017-08-02 DIAGNOSIS — D509 Iron deficiency anemia, unspecified: Secondary | ICD-10-CM | POA: Diagnosis present

## 2017-08-02 DIAGNOSIS — Z8673 Personal history of transient ischemic attack (TIA), and cerebral infarction without residual deficits: Secondary | ICD-10-CM | POA: Diagnosis not present

## 2017-08-02 DIAGNOSIS — Z8619 Personal history of other infectious and parasitic diseases: Secondary | ICD-10-CM

## 2017-08-02 DIAGNOSIS — Z79899 Other long term (current) drug therapy: Secondary | ICD-10-CM

## 2017-08-02 DIAGNOSIS — Z992 Dependence on renal dialysis: Secondary | ICD-10-CM | POA: Diagnosis not present

## 2017-08-02 DIAGNOSIS — I714 Abdominal aortic aneurysm, without rupture: Secondary | ICD-10-CM | POA: Diagnosis present

## 2017-08-02 DIAGNOSIS — D631 Anemia in chronic kidney disease: Secondary | ICD-10-CM | POA: Diagnosis present

## 2017-08-02 DIAGNOSIS — R042 Hemoptysis: Secondary | ICD-10-CM | POA: Diagnosis present

## 2017-08-02 DIAGNOSIS — F329 Major depressive disorder, single episode, unspecified: Secondary | ICD-10-CM | POA: Diagnosis present

## 2017-08-02 DIAGNOSIS — J810 Acute pulmonary edema: Secondary | ICD-10-CM | POA: Diagnosis not present

## 2017-08-02 DIAGNOSIS — I160 Hypertensive urgency: Secondary | ICD-10-CM | POA: Diagnosis present

## 2017-08-02 DIAGNOSIS — I509 Heart failure, unspecified: Secondary | ICD-10-CM | POA: Diagnosis present

## 2017-08-02 DIAGNOSIS — Z888 Allergy status to other drugs, medicaments and biological substances status: Secondary | ICD-10-CM | POA: Diagnosis not present

## 2017-08-02 DIAGNOSIS — I132 Hypertensive heart and chronic kidney disease with heart failure and with stage 5 chronic kidney disease, or end stage renal disease: Principal | ICD-10-CM | POA: Diagnosis present

## 2017-08-02 DIAGNOSIS — I120 Hypertensive chronic kidney disease with stage 5 chronic kidney disease or end stage renal disease: Secondary | ICD-10-CM | POA: Diagnosis not present

## 2017-08-02 DIAGNOSIS — Z87891 Personal history of nicotine dependence: Secondary | ICD-10-CM | POA: Diagnosis not present

## 2017-08-02 DIAGNOSIS — J811 Chronic pulmonary edema: Secondary | ICD-10-CM | POA: Diagnosis not present

## 2017-08-02 HISTORY — DX: End stage renal disease: N18.6

## 2017-08-02 HISTORY — DX: Unspecified glaucoma: H40.9

## 2017-08-02 HISTORY — DX: Dependence on renal dialysis: Z99.2

## 2017-08-02 HISTORY — DX: Cerebral infarction, unspecified: I63.9

## 2017-08-02 HISTORY — DX: Unspecified viral hepatitis C without hepatic coma: B19.20

## 2017-08-02 LAB — CBC
HEMATOCRIT: 27.7 % — AB (ref 39.0–52.0)
HEMOGLOBIN: 8.9 g/dL — AB (ref 13.0–17.0)
MCH: 30.4 pg (ref 26.0–34.0)
MCHC: 32.1 g/dL (ref 30.0–36.0)
MCV: 94.5 fL (ref 78.0–100.0)
Platelets: 180 10*3/uL (ref 150–400)
RBC: 2.93 MIL/uL — ABNORMAL LOW (ref 4.22–5.81)
RDW: 18.8 % — ABNORMAL HIGH (ref 11.5–15.5)
WBC: 7.6 10*3/uL (ref 4.0–10.5)

## 2017-08-02 LAB — COMPREHENSIVE METABOLIC PANEL
ALBUMIN: 3 g/dL — AB (ref 3.5–5.0)
ALT: 9 U/L — ABNORMAL LOW (ref 17–63)
AST: 19 U/L (ref 15–41)
Alkaline Phosphatase: 85 U/L (ref 38–126)
Anion gap: 15 (ref 5–15)
BUN: 59 mg/dL — AB (ref 6–20)
CHLORIDE: 102 mmol/L (ref 101–111)
CO2: 22 mmol/L (ref 22–32)
Calcium: 8.9 mg/dL (ref 8.9–10.3)
Creatinine, Ser: 13.2 mg/dL — ABNORMAL HIGH (ref 0.61–1.24)
GFR calc Af Amer: 4 mL/min — ABNORMAL LOW (ref >60)
GFR calc non Af Amer: 4 mL/min — ABNORMAL LOW (ref >60)
GLUCOSE: 95 mg/dL (ref 65–99)
POTASSIUM: 4.6 mmol/L (ref 3.5–5.1)
SODIUM: 139 mmol/L (ref 135–145)
TOTAL PROTEIN: 6.6 g/dL (ref 6.5–8.1)
Total Bilirubin: 0.9 mg/dL (ref 0.3–1.2)

## 2017-08-02 LAB — I-STAT TROPONIN, ED: Troponin i, poc: 0.04 ng/mL (ref 0.00–0.08)

## 2017-08-02 LAB — BRAIN NATRIURETIC PEPTIDE: B NATRIURETIC PEPTIDE 5: 2225.3 pg/mL — AB (ref 0.0–100.0)

## 2017-08-02 LAB — MRSA PCR SCREENING: MRSA BY PCR: NEGATIVE

## 2017-08-02 LAB — LIPASE, BLOOD: Lipase: 27 U/L (ref 11–51)

## 2017-08-02 MED ORDER — VANCOMYCIN HCL 10 G IV SOLR
1750.0000 mg | Freq: Once | INTRAVENOUS | Status: DC
  Filled 2017-08-02: qty 1750

## 2017-08-02 MED ORDER — LIDOCAINE HCL (PF) 1 % IJ SOLN: 5.0000 mL | INTRAMUSCULAR | Status: DC | PRN

## 2017-08-02 MED ORDER — SEVELAMER CARBONATE 800 MG PO TABS
3200.0000 mg | ORAL_TABLET | Freq: Three times a day (TID) | ORAL | Status: DC
  Administered 2017-08-03 – 2017-08-04 (×6): 3200 mg via ORAL
  Filled 2017-08-02 (×6): qty 4

## 2017-08-02 MED ORDER — DEXTROSE 5 % IV SOLN
1.0000 g | Freq: Once | INTRAVENOUS | Status: DC
  Filled 2017-08-02 (×2): qty 1

## 2017-08-02 MED ORDER — AMLODIPINE BESYLATE 10 MG PO TABS
10.0000 mg | ORAL_TABLET | Freq: Every day | ORAL | Status: DC
  Administered 2017-08-02 – 2017-08-03 (×2): 10 mg via ORAL
  Filled 2017-08-02 (×2): qty 1
  Filled 2017-08-02: qty 2

## 2017-08-02 MED ORDER — ACETAMINOPHEN 650 MG RE SUPP: 650.0000 mg | Freq: Four times a day (QID) | RECTAL | Status: DC | PRN

## 2017-08-02 MED ORDER — HEPARIN SODIUM (PORCINE) 1000 UNIT/ML DIALYSIS: 1000.0000 [IU] | INTRAMUSCULAR | Status: DC | PRN

## 2017-08-02 MED ORDER — SEVELAMER CARBONATE 800 MG PO TABS: 800.0000 mg | ORAL_TABLET | Freq: Three times a day (TID) | ORAL | Status: DC

## 2017-08-02 MED ORDER — SODIUM CHLORIDE 0.9 % IV SOLN: 100.0000 mL | INTRAVENOUS | Status: DC | PRN

## 2017-08-02 MED ORDER — ACETAMINOPHEN 325 MG PO TABS: 650.0000 mg | ORAL_TABLET | Freq: Four times a day (QID) | ORAL | Status: DC | PRN

## 2017-08-02 MED ORDER — SODIUM CHLORIDE 0.9 % IV SOLN
125.0000 mg | INTRAVENOUS | Status: DC
  Administered 2017-08-03 – 2017-08-04 (×2): 125 mg via INTRAVENOUS
  Filled 2017-08-02 (×3): qty 10

## 2017-08-02 MED ORDER — DOXYCYCLINE HYCLATE 100 MG PO TABS: 100.0000 mg | ORAL_TABLET | Freq: Once | ORAL | Status: DC

## 2017-08-02 MED ORDER — HEPARIN SODIUM (PORCINE) 1000 UNIT/ML DIALYSIS: 40.0000 [IU]/kg | Freq: Once | INTRAMUSCULAR | Status: DC

## 2017-08-02 MED ORDER — VANCOMYCIN HCL IN DEXTROSE 750-5 MG/150ML-% IV SOLN
750.0000 mg | INTRAVENOUS | Status: DC
Start: 2017-08-04 — End: 2017-08-02

## 2017-08-02 MED ORDER — LABETALOL HCL 200 MG PO TABS
300.0000 mg | ORAL_TABLET | Freq: Two times a day (BID) | ORAL | Status: DC
  Administered 2017-08-02 – 2017-08-04 (×3): 300 mg via ORAL
  Filled 2017-08-02: qty 1
  Filled 2017-08-02: qty 2
  Filled 2017-08-02: qty 1

## 2017-08-02 MED ORDER — HEPARIN SODIUM (PORCINE) 5000 UNIT/ML IJ SOLN
5000.0000 [IU] | Freq: Three times a day (TID) | INTRAMUSCULAR | Status: DC
  Administered 2017-08-02 – 2017-08-04 (×4): 5000 [IU] via SUBCUTANEOUS
  Filled 2017-08-02 (×4): qty 1

## 2017-08-02 MED ORDER — RENA-VITE PO TABS
1.0000 | ORAL_TABLET | Freq: Every day | ORAL | Status: DC
  Administered 2017-08-02 – 2017-08-03 (×2): 1 via ORAL
  Filled 2017-08-02 (×2): qty 1

## 2017-08-02 MED ORDER — DARBEPOETIN ALFA 200 MCG/0.4ML IJ SOSY
200.0000 ug | PREFILLED_SYRINGE | INTRAMUSCULAR | Status: DC
  Administered 2017-08-03: 200 ug via INTRAVENOUS
  Filled 2017-08-02: qty 0.4

## 2017-08-02 MED ORDER — ALTEPLASE 2 MG IJ SOLR: 2.0000 mg | Freq: Once | INTRAMUSCULAR | Status: DC | PRN

## 2017-08-02 MED ORDER — PENTAFLUOROPROP-TETRAFLUOROETH EX AERO: 1.0000 "application " | INHALATION_SPRAY | CUTANEOUS | Status: DC | PRN

## 2017-08-02 MED ORDER — DIPHENHYDRAMINE HCL 50 MG/ML IJ SOLN
25.0000 mg | Freq: Once | INTRAMUSCULAR | Status: AC
Start: 2017-08-02 — End: 2017-08-02
  Administered 2017-08-02: 25 mg via INTRAVENOUS
  Filled 2017-08-02: qty 1

## 2017-08-02 MED ORDER — HYDRALAZINE HCL 50 MG PO TABS
100.0000 mg | ORAL_TABLET | Freq: Three times a day (TID) | ORAL | Status: DC
  Administered 2017-08-02 – 2017-08-04 (×4): 100 mg via ORAL
  Filled 2017-08-02 (×4): qty 2

## 2017-08-02 MED ORDER — DICLOFENAC SODIUM 1 % TD GEL
4.0000 g | Freq: Four times a day (QID) | TRANSDERMAL | Status: DC
  Filled 2017-08-02: qty 100

## 2017-08-02 MED ORDER — LIDOCAINE-PRILOCAINE 2.5-2.5 % EX CREA: 1.0000 "application " | TOPICAL_CREAM | CUTANEOUS | Status: DC | PRN

## 2017-08-02 NOTE — ED Notes (Signed)
This EMT requested a urine sample from pt. Pt stated he could provide later. RN notified.
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
## 2017-08-02 NOTE — Progress Notes (Signed)
Pharmacy Antibiotic Note  Frank Fuller is a 56 y.o. male admitted on 08/02/2017 with pneumonia.  Pharmacy has been consulted for Vancomycin dosing. Patient hypertensive, WBC wnl, Afebrile. CXR: bilateral opacities.  ESRD HD T, Th, Sat. Did not receive dialysis today.  Cefepime 1g IV X1 received in ED.   Plan: Vancomycin 1750mg  IV X1, then vancomycin 750mg  IV with each HD sessions (T, Th, Sat)  Pre-HD  Vancomycin level as indicated, monitor for c/s, clinical resolution,  F/u de-escalation plan/LOT, F/u inpatient HD schedule/tolerance for entering maintenance doses     Temp (24hrs), Avg:97.7 F (36.5 C), Min:97.7 F (36.5 C), Max:97.7 F (36.5 C)   Recent Labs Lab 08/02/17 1201  WBC 7.6  CREATININE 13.20*    Estimated Creatinine Clearance: 6.9 mL/min (A) (by C-G formula based on SCr of 13.2 mg/dL (H)).    Allergies  Allergen Reactions  . Chlorhexidine     Patch skin test done at dialysis 06/26/17  - staff using clear dressing and alcohol to clean exit site of catheter    Antimicrobials this admission: Vancomycin 10/11 >>  Cefepime 10/11>>       Thank you for allowing pharmacy to be a part of this patient's care.  Jerrye Noble, PharmD Candidate  08/02/2017 4:22 PM

## 2017-08-02 NOTE — Progress Notes (Signed)
Subjective: Interval History:SOB for 2 d, progressive . Cough with blood streaked phlegm.  Did not dialyze today.  Has attended full sessions x 2 prior to today, but only 2 1/2 h prior to that.  Chills, no fever.  Here <2wk ago with catheter malfunction/infx and CHF.   For some reason dry wgt has been ^ 2.5 kg over past 10d despite ^^bp and vol xs...  Objective: Vital signs in last 24 hours: Temp:  [97.7 F (36.5 C)] 97.7 F (36.5 C) (10/11 1209) Pulse Rate:  [81-100] 100 (10/11 2010) Resp:  [13-22] 17 (10/11 1930) BP: (187-222)/(122-142) 187/141 (10/11 2010) SpO2:  [89 %-98 %] 92 % (10/11 1930) Weight change:   Intake/Output from previous day: No intake/output data recorded. Intake/Output this shift: No intake/output data recorded.  General appearance: alert, cooperative, moderate distress and SOB Eyes: alert, cooperative, moderate distress and SOB Neck: PCL Resp: decreased bs, rales in bases, dullness in bases to percussion Chest wall: RIJ PC Cardio: S1, S2 normal, S4 present, systolic murmur: holosystolic 2/6, blowing at apex and LV lift GI: pos bs, liver down 6 cm, soft Extremities: edema 1+ Eyes HTN changes,  Lab Results:  Recent Labs  08/02/17 1201  WBC 7.6  HGB 8.9*  HCT 27.7*  PLT 180   BMET:  Recent Labs  08/02/17 1201  NA 139  K 4.6  CL 102  CO2 22  GLUCOSE 95  BUN 59*  CREATININE 13.20*  CALCIUM 8.9   No results for input(s): PTH in the last 72 hours. Iron Studies: No results for input(s): IRON, TIBC, TRANSFERRIN, FERRITIN in the last 72 hours.  Studies/Results: Ct Abdomen Pelvis Wo Contrast  Result Date: 08/02/2017 CLINICAL DATA:  56 y/o male with hemoptysis, generalized weakness, abdominal pain. Dialysis patient. EXAM: CT ABDOMEN AND PELVIS WITHOUT CONTRAST TECHNIQUE: Multidetector CT imaging of the abdomen and pelvis was performed following the standard protocol without IV contrast. COMPARISON:  07/19/2017, 07/14/2017 FINDINGS: Lower chest:  Stable cardiomegaly. No pericardial effusion. Small or trace layering pleural effusions. Mosaic type attenuation in the lower lobes has not significantly changed, with central peribronchial type ground-glass opacity. More confluent dependent opacity in the left costophrenic angle more resembles atelectasis. Hepatobiliary: Negative noncontrast liver and gallbladder. Pancreas: Negative. Spleen: Negative. Adrenals/Urinary Tract: Stable mild adrenal thickening compatible with adrenal hyperplasia. Bilateral renal atrophy appears stable. No hydronephrosis or proximal hydroureter. Unremarkable urinary bladder. Stomach/Bowel: Mild gas and stool in the rectum. Decompressed sigmoid colon. Decompressed left colon. Mild retained stool throughout the transverse colon and right colon. Outside of the rectum all of the large bowel segments demonstrate up to mild wall thickening, but without convincing mesenteric inflammation. Normal appendix (coronal image 43). No dilated small bowel. Up to mild small bowel wall thickening in the left abdomen appears stable but could be artifactual. Stomach and duodenum appear negative. No abdominal free air.  No free fluid. Vascular/Lymphatic: Calcified aortic and iliofemoral atherosclerosis again noted. Infrarenal abdominal aortic aneurysm better demonstrated on the recent study with IV contrast. Vascular patency is not evaluated in the absence of IV contrast. No lymphadenopathy identified. Reproductive: Negative. Other: No pelvic free fluid. Musculoskeletal: Advanced lower thoracic disc and endplate degeneration. No acute osseous abnormality identified. IMPRESSION: 1. Essentially stable CT appearance of the abdomen and pelvis since 07/19/2017. 2. Questionable mild left abdominal small bowel and generalized large bowel wall thickening such that an infectious or inflammatory enterocolitis would be difficult to exclude, but there is no convincing mesenteric inflammation, and no free fluid. 3.  Continued nonspecific mosaic or peribronchial ground-glass opacity in both lower lobes. Trace bilateral pleural effusions. 4. Aortic Atherosclerosis (ICD10-I70.0) and 3.2 cm infrarenal abdominal aortic aneurysm. Ultrasound follow-up recommended in 3 years as per the prior CT. Electronically Signed   By: Genevie Ann M.D.   On: 08/02/2017 17:19   Dg Chest 2 View  Result Date: 08/02/2017 CLINICAL DATA:  Cough and hemoptysis. EXAM: CHEST  2 VIEW COMPARISON:  Chest x-ray dated July 19, 2017. FINDINGS: Right internal jugular tunnel dialysis catheter again noted, with tip near the cavoatrial junction. Mild cardiomegaly, unchanged. Increased central perihilar opacities bilaterally. Unchanged fissural fluid pseudotumor along the right lower chest. No new pleural effusion. No pneumothorax. No acute osseous abnormality. IMPRESSION: 1. Increased central perihilar opacities bilaterally, which could reflect pulmonary edema or atypical pneumonia. Electronically Signed   By: Titus Dubin M.D.   On: 08/02/2017 15:58    I have reviewed the patient's current medications.  Assessment/Plan: 1 ESRD vol xs . Needs lower dry, not higher.  bp is vol dependent.  Needs back to back HD 2 Cough with hemoptysis suspect pulm edema but could be a primary pulm process.  No wbc^, fever.  3 HTN bp meds and lower vol 4 HPTH 5 Anemia esa, Fe P HD, esa, lower vol , cont bp meds, cont Fe    LOS: 0 days   Lowana Hable L 08/02/2017,9:10 PM

## 2017-08-02 NOTE — ED Notes (Signed)
Patient transported to CT 
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
## 2017-08-02 NOTE — ED Notes (Signed)
Pt reports he has itching and burning to area around port that was just bandaged. PA made aware. Pt also noted to have oxygen saturations around 90%. Placed on 2L [place] and saturations improved. Will cont to monitor.

## 2017-08-02 NOTE — ED Triage Notes (Signed)
Pt presents for evaluation of coughing up blood x 1 day. Pt was at dialysis this AM, did not receive any of his treatment. Pt reports coughing up large clots, EMS reports pt coughed up clear sputum with bloody streaks in route. Pt ambulatory, hypertensive 198/60.

## 2017-08-02 NOTE — ED Notes (Signed)
IV team at bedside 

## 2017-08-02 NOTE — ED Notes (Signed)
Attempted report, secretary, Jaci Standard, stated RN unavailable @ this time could this RN leave callback #. This RN told Network engineer that b/c pt has had a bed for 1hr 40 mins, this RN would have to do bedside report, could Network engineer find another Educational psychologist for report, Network engineer stated she couldn't find anybody. Will do bedside report.

## 2017-08-03 ENCOUNTER — Inpatient Hospital Stay (HOSPITAL_COMMUNITY): Payer: Medicare Other

## 2017-08-03 DIAGNOSIS — J810 Acute pulmonary edema: Secondary | ICD-10-CM

## 2017-08-03 DIAGNOSIS — R042 Hemoptysis: Secondary | ICD-10-CM

## 2017-08-03 DIAGNOSIS — I120 Hypertensive chronic kidney disease with stage 5 chronic kidney disease or end stage renal disease: Secondary | ICD-10-CM

## 2017-08-03 LAB — IRON AND TIBC
Iron: 50 ug/dL (ref 45–182)
Saturation Ratios: 19 % (ref 17.9–39.5)
TIBC: 260 ug/dL (ref 250–450)
UIBC: 210 ug/dL

## 2017-08-03 LAB — RENAL FUNCTION PANEL
ALBUMIN: 2.7 g/dL — AB (ref 3.5–5.0)
Anion gap: 14 (ref 5–15)
BUN: 21 mg/dL — AB (ref 6–20)
CO2: 26 mmol/L (ref 22–32)
CREATININE: 6.23 mg/dL — AB (ref 0.61–1.24)
Calcium: 8 mg/dL — ABNORMAL LOW (ref 8.9–10.3)
Chloride: 96 mmol/L — ABNORMAL LOW (ref 101–111)
GFR calc Af Amer: 10 mL/min — ABNORMAL LOW (ref >60)
GFR, EST NON AFRICAN AMERICAN: 9 mL/min — AB (ref >60)
Glucose, Bld: 91 mg/dL (ref 65–99)
PHOSPHORUS: 2.5 mg/dL (ref 2.5–4.6)
POTASSIUM: 3.2 mmol/L — AB (ref 3.5–5.1)
Sodium: 136 mmol/L (ref 135–145)

## 2017-08-03 LAB — CBC
HEMATOCRIT: 26.3 % — AB (ref 39.0–52.0)
HEMOGLOBIN: 8.6 g/dL — AB (ref 13.0–17.0)
MCH: 30.5 pg (ref 26.0–34.0)
MCHC: 32.7 g/dL (ref 30.0–36.0)
MCV: 93.3 fL (ref 78.0–100.0)
Platelets: 172 10*3/uL (ref 150–400)
RBC: 2.82 MIL/uL — AB (ref 4.22–5.81)
RDW: 19 % — ABNORMAL HIGH (ref 11.5–15.5)
WBC: 5.4 10*3/uL (ref 4.0–10.5)

## 2017-08-03 LAB — ECHOCARDIOGRAM COMPLETE: WEIGHTICAEL: 2765.45 [oz_av]

## 2017-08-03 LAB — FERRITIN: Ferritin: 305 ng/mL (ref 24–336)

## 2017-08-03 MED ORDER — LIDOCAINE HCL (PF) 1 % IJ SOLN: 5.0000 mL | INTRAMUSCULAR | Status: DC | PRN

## 2017-08-03 MED ORDER — ALTEPLASE 2 MG IJ SOLR: 2.0000 mg | Freq: Once | INTRAMUSCULAR | Status: DC | PRN

## 2017-08-03 MED ORDER — DARBEPOETIN ALFA 200 MCG/0.4ML IJ SOSY
PREFILLED_SYRINGE | INTRAMUSCULAR | Status: AC
  Filled 2017-08-03: qty 0.4

## 2017-08-03 MED ORDER — SODIUM CHLORIDE 0.9 % IV SOLN: 100.0000 mL | INTRAVENOUS | Status: DC | PRN

## 2017-08-03 MED ORDER — LIDOCAINE-PRILOCAINE 2.5-2.5 % EX CREA: 1.0000 "application " | TOPICAL_CREAM | CUTANEOUS | Status: DC | PRN

## 2017-08-03 MED ORDER — HEPARIN SODIUM (PORCINE) 1000 UNIT/ML DIALYSIS: 1000.0000 [IU] | INTRAMUSCULAR | Status: DC | PRN

## 2017-08-03 MED ORDER — PENTAFLUOROPROP-TETRAFLUOROETH EX AERO: 1.0000 "application " | INHALATION_SPRAY | CUTANEOUS | Status: DC | PRN

## 2017-08-03 NOTE — Progress Notes (Signed)
Admit: 08/02/2017 LOS: 1  60M ESRD with HTN, reported hemoptysis, hypervolemia  Subjective:  HD yesterday: post weight 78.4 kg, 3.8 L UF; tolerated well; full Treatment BP still up No further hemptysis Hb stable     10/11 0701 - 10/12 0700 In: -  Out: 3759   Filed Weights   08/02/17 2304 08/03/17 0350 08/03/17 0559  Weight: 83.3 kg (183 lb 10.3 oz) 79.2 kg (174 lb 9.7 oz) 78.4 kg (172 lb 13.5 oz)    Scheduled Meds: . amLODipine  10 mg Oral QHS  . darbepoetin (ARANESP) injection - DIALYSIS  200 mcg Intravenous Q Fri-HD  . diclofenac sodium  4 g Topical QID  . heparin  5,000 Units Subcutaneous Q8H  . hydrALAZINE  100 mg Oral TID  . labetalol  300 mg Oral BID  . multivitamin  1 tablet Oral QHS  . sevelamer carbonate  3,200 mg Oral TID WC   Continuous Infusions: . [START ON 08/04/2017] ferric gluconate (FERRLECIT/NULECIT) IV     PRN Meds:.acetaminophen **OR** acetaminophen  Current Labs: reviewed    Physical Exam:  Blood pressure (!) 163/108, pulse 89, temperature 98.8 F (37.1 C), temperature source Oral, resp. rate 18, weight 78.4 kg (172 lb 13.5 oz), SpO2 94 %. NAD eating breakfast sitting up RRR nl s1s2 with 2/6 MSM Diminished in bases No LEE TDC  Outpt HD Orders Unit: [facility] Days: [facility] Time: 4h Dialyzer: F180 EDW: 79.5kg K/Ca: 2/2.25 Access: TDC Needle Size: na BFR/DFR: 400 / a1.5 UF Proflie: none VDRA: 0.29mcg qTx EPO: mircera 200 q2wk, last dose 10/2 IV Fe: On venofer load through 10/30 Heparin: 2000 IVB qTx Most Recent Phos / PTH: 5.7 / 507 Most Recent TSAT: 15% Treatment Adherence: freq s/o early   A 1. ESRD via [place] at [facility] 2. HTN, uncontrolled, hypervolemia 3. Hemoptysis 4. Anemia, for ESA today; on outpt Fe load cont here  P 1. HD again today, 4K, 3L UF, TDC,  2. Add on Fe panel, ESA today 3. Keep BP meds as they are currently ,see if can improve by lowering post weights   [name] MD 08/03/2017, 9:02 AM   Recent Labs Lab  08/02/17 1201 08/03/17 0554  NA 139 136  K 4.6 3.2*  CL 102 96*  CO2 22 26  GLUCOSE 95 91  BUN 59* 21*  CREATININE 13.20* 6.23*  CALCIUM 8.9 8.0*  PHOS  --  2.5    Recent Labs Lab 08/02/17 1201 08/03/17 0554  WBC 7.6 5.4  HGB 8.9* 8.6*  HCT 27.7* 26.3*  MCV 94.5 93.3  PLT 180 172

## 2017-08-03 NOTE — Progress Notes (Signed)
Medicine attending: I examined this patient today together with resident physician Dr. Rochele Pages and I concur with her evaluation and management plan which we discussed together. Please see attending history and physical note for complete details. We appreciate close nephrology follow-up.  Patient is above his dry weight.  He will be dialyzed again today.  He is on a Tuesday Thursday Saturday schedule as an outpatient.  Per nephrology recommendations, we will not change his antihypertensives at this time but reassess after additional volume is removed with dialysis. We plan a CT scan of the chest today in view of hemoptysis to rule out occult malignancy in this man who just stopped smoking a few weeks ago after smoking for 42 years.

## 2017-08-03 NOTE — Progress Notes (Signed)
  Echocardiogram 2D Echocardiogram has been performed.  Darlina Sicilian M 08/03/2017, 11:21 AM

## 2017-08-03 NOTE — Progress Notes (Signed)
   Subjective: Patient seen and examined. States he is continuing to cough up streaks of blood. Denies chest pain, abdominal pain, shortness of breath, or visual disturbances. Patient tolerated his HD session well.   Objective:  Vital signs in last 24 hours: Vitals:   08/03/17 0557 08/03/17 0559 08/03/17 0750 08/03/17 1300  BP:  (!) 169/113 (!) 163/108 (!) 183/125  Pulse:  85 89 87  Resp:  12 18 17   Temp: 98.9 F (37.2 C)  98.8 F (37.1 C) 98.2 F (36.8 C)  TempSrc: Oral  Oral Oral  SpO2:  96% 94% 95%  Weight:  172 lb 13.5 oz (78.4 kg)  176 lb 9.4 oz (80.1 kg)   General: Laying in bed comfortably, NAD HEENT: [place]/AT, EOMI, no scleral icterus, PERRL Cardiac: RRR, No R/M/G appreciated Pulm: normal effort, CTAB  Abd: soft, non tender, non distended, BS normal Ext: extremities well perfused, no peripheral edema Neuro: alert and oriented X3, cranial nerves II-XII grossly intact   Assessment/Plan:  Active Problems:   Pulmonary edema  ESRD Shortness of breath, Pulmonary edema Most likely secondary to pulmonary edema and volume overload in the setting of ESRD. Patient remains afebrile with no leukoctosis, and is sating well on room air. CXR revealed perihilar opacities that are consistent with pulmonary edema. CT of the abdomen showed trace bibasilar pleural effusions. Does not appear volume overloaded on exam, no peripheral edema or JVD, no crackles appreciated on PE. Elevated BNP >2200 on admission. Patient received HD session last night with removal of 3.8 L, tolerated it well. Symptoms improved after HD.  - HD today  -ECHO completed, results pending  -Nephro consulted >> appreciate recs  Hypertension Uncontrolled in the setting of ESRD. Improved after dialysis. Most recent 182/114. Asymptomatic.  -Continue Hydralazine 100 mg TID, Labetalol 300 mg, Amlodipine 10 mg -Will continue to monitor  Hemoptysis Afebrile without leukocytosis. Patient denies weight loss, night sweats,  or fevers. Acute onset hemoptysis and cough maybe consistent with acute viral bronchitis or atypical pneumonia. PE less likely as patient is sating well on RA, normal heart rate, and without increased work of breathing. No evidence of DVT on exam. Patient has never had a CT chest.  -Closely monitor vital signs for symptoms of infection -CT chest WO contrast   VTE ppx: SQ Heparin  Dispo: Anticipated discharge in approximately 1-2 day(s).   Melanee Spry, MD 08/03/2017, 1:19 PM Pager: [phone]

## 2017-08-04 DIAGNOSIS — I120 Hypertensive chronic kidney disease with stage 5 chronic kidney disease or end stage renal disease: Secondary | ICD-10-CM

## 2017-08-04 DIAGNOSIS — R042 Hemoptysis: Secondary | ICD-10-CM

## 2017-08-04 DIAGNOSIS — N186 End stage renal disease: Secondary | ICD-10-CM

## 2017-08-04 DIAGNOSIS — I714 Abdominal aortic aneurysm, without rupture: Secondary | ICD-10-CM

## 2017-08-04 DIAGNOSIS — J811 Chronic pulmonary edema: Secondary | ICD-10-CM

## 2017-08-04 DIAGNOSIS — Z888 Allergy status to other drugs, medicaments and biological substances status: Secondary | ICD-10-CM

## 2017-08-04 DIAGNOSIS — Z992 Dependence on renal dialysis: Secondary | ICD-10-CM

## 2017-08-04 LAB — URINALYSIS, ROUTINE W REFLEX MICROSCOPIC
Bacteria, UA: NONE SEEN
Bilirubin Urine: NEGATIVE
GLUCOSE, UA: 150 mg/dL — AB
HGB URINE DIPSTICK: NEGATIVE
KETONES UR: NEGATIVE mg/dL
Leukocytes, UA: NEGATIVE
Nitrite: NEGATIVE
Protein, ur: >=300 mg/dL — AB
Specific Gravity, Urine: 1.009 (ref 1.005–1.030)
pH: 9 — ABNORMAL HIGH (ref 5.0–8.0)

## 2017-08-04 LAB — CBC
HCT: 27.4 % — ABNORMAL LOW (ref 39.0–52.0)
Hemoglobin: 8.9 g/dL — ABNORMAL LOW (ref 13.0–17.0)
MCH: 30.7 pg (ref 26.0–34.0)
MCHC: 32.5 g/dL (ref 30.0–36.0)
MCV: 94.5 fL (ref 78.0–100.0)
Platelets: 151 10*3/uL (ref 150–400)
RBC: 2.9 MIL/uL — ABNORMAL LOW (ref 4.22–5.81)
RDW: 19.4 % — ABNORMAL HIGH (ref 11.5–15.5)
WBC: 5.2 10*3/uL (ref 4.0–10.5)

## 2017-08-04 LAB — RENAL FUNCTION PANEL
Albumin: 2.7 g/dL — ABNORMAL LOW (ref 3.5–5.0)
Anion gap: 10 (ref 5–15)
BUN: 25 mg/dL — ABNORMAL HIGH (ref 6–20)
CO2: 25 mmol/L (ref 22–32)
Calcium: 8.5 mg/dL — ABNORMAL LOW (ref 8.9–10.3)
Chloride: 99 mmol/L — ABNORMAL LOW (ref 101–111)
Creatinine, Ser: 6.43 mg/dL — ABNORMAL HIGH (ref 0.61–1.24)
GFR calc Af Amer: 10 mL/min — ABNORMAL LOW (ref >60)
GFR calc non Af Amer: 9 mL/min — ABNORMAL LOW (ref >60)
Glucose, Bld: 97 mg/dL (ref 65–99)
Phosphorus: 3.7 mg/dL (ref 2.5–4.6)
Potassium: 3.8 mmol/L (ref 3.5–5.1)
Sodium: 134 mmol/L — ABNORMAL LOW (ref 135–145)

## 2017-08-04 MED ORDER — SODIUM CHLORIDE 0.9 % IV SOLN: 100.0000 mL | INTRAVENOUS | Status: DC | PRN

## 2017-08-04 MED ORDER — HEPARIN SODIUM (PORCINE) 1000 UNIT/ML DIALYSIS: 40.0000 [IU]/kg | Freq: Once | INTRAMUSCULAR | Status: DC

## 2017-08-04 MED ORDER — ALTEPLASE 2 MG IJ SOLR: 2.0000 mg | Freq: Once | INTRAMUSCULAR | Status: DC | PRN

## 2017-08-04 MED ORDER — HEPARIN SODIUM (PORCINE) 1000 UNIT/ML DIALYSIS: 1000.0000 [IU] | INTRAMUSCULAR | Status: DC | PRN

## 2017-08-04 MED ORDER — PENTAFLUOROPROP-TETRAFLUOROETH EX AERO: 1.0000 "application " | INHALATION_SPRAY | CUTANEOUS | Status: DC | PRN

## 2017-08-04 MED ORDER — LIDOCAINE-PRILOCAINE 2.5-2.5 % EX CREA: 1.0000 "application " | TOPICAL_CREAM | CUTANEOUS | Status: DC | PRN

## 2017-08-04 MED ORDER — SEVELAMER HCL 800 MG PO TABS: 3200.0000 mg | ORAL_TABLET | Freq: Three times a day (TID) | ORAL | 2 refills | Status: DC

## 2017-08-04 MED ORDER — LIDOCAINE HCL (PF) 1 % IJ SOLN: 5.0000 mL | INTRAMUSCULAR | Status: DC | PRN

## 2017-08-04 NOTE — Progress Notes (Signed)
[facility] Progress Note  Subjective: no more SOB / hemoptysis, no CP.  Lots of questions about BP   Vitals:   08/04/17 0010 08/04/17 0347 08/04/17 0807 08/04/17 0809  BP: (!) 178/132 (!) 175/110 (!) 187/127   Pulse: 89 89 86 90  Resp:   20   Temp: 98.9 F (37.2 C) 98.9 F (37.2 C) 98.7 F (37.1 C)   TempSrc: Axillary Oral Oral   SpO2: 98% 99% 100%   Weight:  77.8 kg (171 lb 9.6 oz)      Inpatient medications: . amLODipine  10 mg Oral QHS  . darbepoetin (ARANESP) injection - DIALYSIS  200 mcg Intravenous Q Fri-HD  . diclofenac sodium  4 g Topical QID  . heparin  5,000 Units Subcutaneous Q8H  . hydrALAZINE  100 mg Oral TID  . labetalol  300 mg Oral BID  . multivitamin  1 tablet Oral QHS  . sevelamer carbonate  3,200 mg Oral TID WC   . ferric gluconate (FERRLECIT/NULECIT) IV 125 mg (08/03/17 1702)   acetaminophen **OR** acetaminophen  Exam: Alert no distress No jvd Chest cta bilat RRR no mrg Abd soft scaphoid No LE edema TDC  Dialysis: TTS [facility] 4h  79.5kg   2/2.25   TDC   Hep 2000 -venofer load through 10/30 -mirc every 2 wks last 10/2 -calcitriol 0.25 tiw      Impression: 1. Pulm edema/ hemoptysis - severe vol load/ uncont HTN. SOB resolved. Pt is losing body wt, needs another HD today and hopefully will be stable for dc today after HD.  2. Uncont HTN - also likely vol related, lowering dry wt, continues on 3 home BP meds. Says he can't take clonidine or lisinopril due to side effects.  3. Anemia of CKD, cont ESA 4. MBD cont meds  Plan - as above   [name] MD [facility] pager [phone]   08/04/2017, 10:53 AM    Recent Labs Lab 08/02/17 1201 08/03/17 0554  NA 139 136  K 4.6 3.2*  CL 102 96*  CO2 22 26  GLUCOSE 95 91  BUN 59* 21*  CREATININE 13.20* 6.23*  CALCIUM 8.9 8.0*  PHOS  --  2.5    Recent Labs Lab 08/02/17 1201 08/03/17 0554  AST 19  --   ALT 9*  --   ALKPHOS 85  --   BILITOT 0.9  --    PROT 6.6  --   ALBUMIN 3.0* 2.7*    Recent Labs Lab 08/02/17 1201 08/03/17 0554  WBC 7.6 5.4  HGB 8.9* 8.6*  HCT 27.7* 26.3*  MCV 94.5 93.3  PLT 180 172   Iron/TIBC/Ferritin/ %Sat    Component Value Date/Time   IRON 50 08/03/2017 1028   TIBC 260 08/03/2017 1028   FERRITIN 305 08/03/2017 1028   IRONPCTSAT 19 08/03/2017 1028

## 2017-08-04 NOTE — Progress Notes (Signed)
  Date: 08/04/2017  Patient name: Frank Fuller  Medical record number: [id number]  Date of birth: [birth year]   I have seen and evaluated this patient and I have discussed the plan of care with the house staff. Please see their note for complete details. I concur with their findings with the following additions/corrections:   57 year old male with ESRD on HD who presented with hemoptysis. Significantly improved now after multiple sessions of dialysis for volume overload, which was the presumed etiology of his hemoptysis. He is stable for discharge.  Oda Kilts, MD 08/04/2017, 7:45 PM

## 2017-08-04 NOTE — Progress Notes (Signed)
   Subjective: Patient states that his cough and hemoptysis has resolved since yesterday. He denies fevers, chills, chest pain, shortness of breath, nausea, vomiting. He states he had a great discussion with the nephrologist about his blood pressure and fluid management and that he was drinking too much fluids at home.  Objective:  Vital signs in last 24 hours: Vitals:   08/04/17 1530 08/04/17 1600 08/04/17 1630 08/04/17 1704  BP: (!) 170/89 (!) 141/75 (!) 141/78 (!) 156/102  Pulse: 90 86 84   Resp: 19 (!) 22 15 16   Temp:   98 F (36.7 C)   TempSrc:   Oral   SpO2:   100%   Weight:   168 lb 14 oz (76.6 kg)    General: sitting up in bed comfortably, NAD HEENT: EOMI, no scleral icterus Cardiac: RRR, No R/M/G appreciated Pulm: normal effort, CTAB, minimal bibasilar crackles  Abd: soft, non tender, non distended, BS normal Ext: extremities well perfused, no peripheral edema Neuro: alert and oriented X3, cranial nerves II-XII grossly intact  Assessment/Plan:  Active Problems:   Pulmonary edema   Hemoptysis  ESRD Shortness of breath, Pulmonary edema Shortness of breath and cough with hemoptysis seems to be 2/2 pulmonary edema stemming from need to establish new dry weight and patient noncompliance with fluid restriction. He is completely asymptomatic today and has had 3 session of HD back to back with better control of his BP. Echo done this admission shows EF 45-50%, and G2DD; there is no regional wall motion abnormality. We discussed again fluid restriction, salt intake including hidden salt in processed foods, etc. - likely discharge today after HD.  Hypertension Uncontrolled in the setting of ESRD. Improved after dialysis.   -Continue Hydralazine 100 mg TID, Labetalol 300 mg, Amlodipine 10 mg -will continue to work with dialysis to lower dry weight and with PCP for medication management.  Hemoptysis Resolved with control of his fluid status with HD. CT chest performed showed  ground glass opacities consistent with pulm edema vs pulm hemorrhage though most likely consistent with pulm edema as there was symptoms resolution with volume management. He continues to be afebrile.   Loculated fluid collection in inferior aspect of right ML He has remained asymptomatic with this finding. No indication for acute management but will include in documentation for PCP to f/u at the [place].  VTE ppx: SQ Heparin  Dispo: Anticipated discharge in approximately today.   Alphonzo Grieve, MD 08/04/2017, 5:43 PM

## 2017-08-04 NOTE — Progress Notes (Signed)
Pt d/c home in stable condition, in no acute distress.  No oxygen was needed today, Sats 93-100% on RA.  BP elevated but per MD, this is pt baseline.  All d/c instructions given to pt.  Only med change was Renvela, to take 3200mg  with each meal.  Pt understands and states his meds are all arranged accordingly in a med case.  PIV intact when d/c.

## 2017-08-04 NOTE — Discharge Summary (Signed)
Name: Frank Fuller MRN: [id number] DOB: Dec 05, 1960 56 y.o. PCP: Cathlyn Parsons, PA-C  Date of Admission: 08/02/2017  2:07 PM Date of Discharge: 08/04/2017 Attending Physician: Dr. Rebeca Alert  Discharge Diagnosis: 1. Pulmonary edema 2. ESRD on HD Active Problems:   Pulmonary edema   Hemoptysis   Discharge Medications: Allergies as of 08/04/2017      Reactions   Chlorhexidine    Patch skin test done at dialysis 06/26/17  - staff using clear dressing and alcohol to clean exit site of catheter   Clonidine Derivatives Other (See Comments)   unresponsive   Lisinopril Other (See Comments)   unresponsive      Medication List    TAKE these medications   amLODipine 10 MG tablet Commonly known as:  NORVASC Take 10 mg by mouth at bedtime.   hydrALAZINE 100 MG tablet Commonly known as:  APRESOLINE Take 100 mg by mouth 3 (three) times daily. Hold for systolic blood pressure 951/88   labetalol 300 MG tablet Commonly known as:  NORMODYNE TAKE 1 TABLET (300MG ) BY MOUTH TWICE DAILY   sevelamer 800 MG tablet Commonly known as:  RENAGEL Take 4 tablets (3,200 mg total) by mouth 3 (three) times daily with meals. What changed:  how much to take       Disposition and follow-up:   Frank Fuller was discharged from [facility] in Stable condition.  At the hospital follow up visit please address:  1.   ESRD on HD: --please reaffirm fluid intake <2L and salt intake <2gm daily --new dry weight is being established, at discharge was 76kg  Uncontrolled HTN: Patient with difficult to control HTN in setting of ESRD that is responsive to HD. --labetalol 300mg  BID --hydralazine 100mg   TID --amlodipine 10mg  daily --Please continue adjusting medication regimen in conjunction with fluid status management for better control of BP  AAA: 3.2 cm infrarenal AAA; repeat U/S in 3 yrs  R mid lobe pleural fluid loculation: Consider following with CT in 3-6 months   2.  Labs /  imaging needed at time of follow-up: Needs repeat AAA imaging in 3 years; consider further evaluation of pleural fluid collection  3.  Pending labs/ test needing follow-up: none  Follow-up Appointments: Follow-up Information    Cathlyn Parsons, [place]. Schedule an appointment as soon as possible for a visit in 1 week(s).   Specialty:  Physician Assistant Contact information: [place] [id number] [place] Hospital Course by problem list: Active Problems:   Pulmonary edema   Hemoptysis   Hemoptysis Pulmonary edema ESRD on HD Patient presented to ED from dialysis with increasing shortness of breath, one day history of hemoptysis and cough with uncontrolled hypertension, no hypoxia and afebrile. Patient also initially with abd pain due to coughing and no acute abdomen signs; CT abd was performed which showed small and large bowel wall thickening thought to be infectious or inflammatory - no actions were taken as patient was without further abdominal or infectious symptoms and abd pain resolved. He was found to have pulmonary edema on CXR and further on chest CT. Patient was evaluated by nephrology and had 3 days back to back dialysis for fluid removal and establishment of new dry weight. His cough, shortness of breath and hemoptysis resolved completely by the 2nd HD session. He was educated on fluid and salt intake at home. He will resume his previous HD schedule on TThS. Echo was performed this  admission which showed mildly decreased EF at 45-50%, and grade 2 diastolic dysfunction.   Uncontrolled HTN: Patient with chronically uncontrolled hypertension that is responsive somewhat to HD. On admission, BP was 210/140s (baseline per patient is 170s/120s); on admission he denied headache, vision or hearing changes, chest pain, nausea, vomiting. His EKG did not show acute ischemic changes and initial iStat troponin was negative. Patient was continued on his home regimen  of labetalol 300mg  BID, hydralazine 100mg  TID, and amlodipine 10mg  daily. On day of discharge after 3rd HD session, his BP was still ranging from 150-170s/90-100s and he remained asymptomatic. Please continue to work with patient and HD for further improvement of his BP control.   Loculated fluid collection in inferior aspect of middle lobe of right lung: Patient has had stable fluid collection in right lung throughout recent admissions over the last month. He has not become symptomatic with it and I do not believe it is contributing to his symptoms. Previously it was recommended to obtain CT chest to further characterize this loculation; CT chest was obtained this admission with no changes to its appearance. Consider repeat CT chest in 3-6 months to follow.   Infrarenal abdominal aortic aneurysm: Patient with incidental finding of 3.2cm infrarenal AAA on CT abdomen. Please f/u with ultrasound in 3 years per current recommendations.  Discharge Vitals:   BP (!) 156/102   Pulse 84   Temp 98 F (36.7 C) (Oral)   Resp 16   Wt 168 lb 14 oz (76.6 kg)   SpO2 100%   BMI 20.83 kg/m   Pertinent Labs, Studies, and Procedures:  CBC Latest Ref Rng & Units 08/04/2017 08/03/2017 08/02/2017  WBC 4.0 - 10.5 K/uL 5.2 5.4 7.6  Hemoglobin 13.0 - 17.0 g/dL 8.9(L) 8.6(L) 8.9(L)  Hematocrit 39.0 - 52.0 % 27.4(L) 26.3(L) 27.7(L)  Platelets 150 - 400 K/uL 151 172 180   BMP Latest Ref Rng & Units 08/04/2017 08/03/2017 08/02/2017  Glucose 65 - 99 mg/dL 97 91 95  BUN 6 - 20 mg/dL 25(H) 21(H) 59(H)  Creatinine 0.61 - 1.24 mg/dL 6.43(H) 6.23(H) 13.20(H)  Sodium 135 - 145 mmol/L 134(L) 136 139  Potassium 3.5 - 5.1 mmol/L 3.8 3.2(L) 4.6  Chloride 101 - 111 mmol/L 99(L) 96(L) 102  CO2 22 - 32 mmol/L 25 26 22   Calcium 8.9 - 10.3 mg/dL 8.5(L) 8.0(L) 8.9   CXR 08/02/17: Increased central perihilar opacities bilaterally which could reflect pulmonary edema or atypical pneumonia.   CT abd/pel wo contrast  08/02/17: 1. Questionable mild left abdominal small bowel and generalized large bowel wall thickening such that an infectious or inflammatory enterocolitis would be difficult to exclude, but there is no convincing mesenteric inflammation, and no free fluid. 2. Continued nonspecific mosaic or peribronchial ground-glass opacity in both lower lobes. Trace bilateral pleural effusions. 3. Aortic Atherosclerosis (ICD10-I70.0) and 3.2 cm infrarenal abdominal aortic aneurysm. Ultrasound follow-up recommended in 3 years as per the prior CT.  CT chest wo contrast 08/03/17: 1. Diffuse ground-glass opacities suggest pulmonary edema. Cannot exclude pulmonary hemorrhage with reported hemoptysis. 2. Stable loculated fluid collection the medial aspect of the RIGHT middle lobe. Similar to CT of 07/14/2017 3. No mediastinal adenopathy  Echo 08/03/17: - Left ventricle: The cavity size was normal. There was mild   concentric hypertrophy. Systolic function was mildly reduced. The   estimated ejection fraction was in the range of 45% to 50%. Wall   motion was normal; there were no regional wall motion   abnormalities. Features are  consistent with a pseudonormal left   ventricular filling pattern, with concomitant abnormal relaxation   and increased filling pressure (grade 2 diastolic dysfunction). - Mitral valve: There was mild regurgitation. - Left atrium: The atrium was mildly dilated. - Right ventricle: The cavity size was mildly dilated. Wall   thickness was normal. - Right atrium: The atrium was mildly dilated. - Atrial septum: No defect or patent foramen ovale was identified. - Tricuspid valve: There was mild regurgitation. Diastolic   regurgitation was present. - Pulmonary arteries: Systolic pressure was moderately increased.   PA peak pressure: 58 mm Hg (S).  Discharge Instructions: Discharge Instructions    (HEART FAILURE PATIENTS) Call MD:  Anytime you have any of the following symptoms:  1) 3 pound weight gain in 24 hours or 5 pounds in 1 week 2) shortness of breath, with or without a dry hacking cough 3) swelling in the hands, feet or stomach 4) if you have to sleep on extra pillows at night in order to breathe.    Complete by:  As directed    Call MD for:  difficulty breathing, headache or visual disturbances    Complete by:  As directed    Call MD for:  extreme fatigue    Complete by:  As directed    Call MD for:  hives    Complete by:  As directed    Call MD for:  persistant dizziness or light-headedness    Complete by:  As directed    Call MD for:  persistant nausea and vomiting    Complete by:  As directed    Call MD for:  redness, tenderness, or signs of infection (pain, swelling, redness, odor or green/yellow discharge around incision site)    Complete by:  As directed    Call MD for:  severe uncontrolled pain    Complete by:  As directed    Call MD for:  temperature >100.4    Complete by:  As directed    Diet - low sodium heart healthy    Complete by:  As directed    Discharge instructions    Complete by:  As directed    For your blood pressure please continue to take amlodipine 10mg  daily, hydralazine 100mg  three times daily, and labetalol 300mg  twice daily. Please work with your dialysis center and your primary care provider on your blood pressure in terms of decreasing your dry weight and possibly adjusting your medicines when you follow up.  You'll continue your dialysis Tuesdays, Thursdays and Saturdays. Please limit the amount of fluids from any source that you take it to less than 2 liters, and limit the amount of salt you take in to 2 grams (there is hidden salt in fried foods, cheese, meats, frozen meals, sauces, canned foods).   We performed an Echocardiogram of your heart; it showed EF 45-50%, and grade 2 diastolic dysfunction which is not unusual for patients with renal disease requiring dialysis.   We also performed a chest CT which showed fluid in  your lungs and also a loculated fluid collection in your left lower lung that has been stable for the last few weeks based on the images you've gotten your last couple of admissions. Please talk with your primary care provider about further follow up for this.   As discussed with your prior admissions, you were also found to have an abdominal aortic aneurysm, size of 3.2cm that should be monitored with imaging in 3 years.  Please schedule an  appointment to see your primary care provider in 1 to 2 weeks.  If you develop shortness of breath, chest pain, fevers, chills please either see your primary care provider or the emergency room.   Increase activity slowly    Complete by:  As directed       Signed: Alphonzo Grieve, MD 08/05/2017, 2:58 PM

## 2018-08-20 ENCOUNTER — Emergency Department (HOSPITAL_COMMUNITY)
Admission: EM | Admit: 2018-08-20 | Discharge: 2018-08-20 | Disposition: A | Discharge status: Home / Self Care | Payer: Medicare Other | Attending: Emergency Medicine | Admitting: Emergency Medicine

## 2018-08-20 ENCOUNTER — Emergency Department (HOSPITAL_COMMUNITY): Payer: Medicare Other

## 2018-08-20 DIAGNOSIS — Z992 Dependence on renal dialysis: Secondary | ICD-10-CM | POA: Diagnosis not present

## 2018-08-20 DIAGNOSIS — I120 Hypertensive chronic kidney disease with stage 5 chronic kidney disease or end stage renal disease: Secondary | ICD-10-CM | POA: Diagnosis not present

## 2018-08-20 DIAGNOSIS — N186 End stage renal disease: Secondary | ICD-10-CM | POA: Diagnosis not present

## 2018-08-20 DIAGNOSIS — F17210 Nicotine dependence, cigarettes, uncomplicated: Secondary | ICD-10-CM | POA: Diagnosis not present

## 2018-08-20 DIAGNOSIS — E8770 Fluid overload, unspecified: Secondary | ICD-10-CM

## 2018-08-20 DIAGNOSIS — Z79899 Other long term (current) drug therapy: Secondary | ICD-10-CM | POA: Insufficient documentation

## 2018-08-20 DIAGNOSIS — E875 Hyperkalemia: Secondary | ICD-10-CM | POA: Insufficient documentation

## 2018-08-20 DIAGNOSIS — R0789 Other chest pain: Secondary | ICD-10-CM | POA: Diagnosis present

## 2018-08-20 LAB — CBC WITH DIFFERENTIAL/PLATELET
ABS IMMATURE GRANULOCYTES: 0.04 10*3/uL (ref 0.00–0.07)
BASOS PCT: 0 %
Basophils Absolute: 0 10*3/uL (ref 0.0–0.1)
EOS ABS: 0.2 10*3/uL (ref 0.0–0.5)
Eosinophils Relative: 3 %
HCT: 32.3 % — ABNORMAL LOW (ref 39.0–52.0)
Hemoglobin: 10 g/dL — ABNORMAL LOW (ref 13.0–17.0)
IMMATURE GRANULOCYTES: 1 %
LYMPHS ABS: 1 10*3/uL (ref 0.7–4.0)
Lymphocytes Relative: 15 %
MCH: 31.1 pg (ref 26.0–34.0)
MCHC: 31 g/dL (ref 30.0–36.0)
MCV: 100.3 fL — AB (ref 80.0–100.0)
MONOS PCT: 9 %
Monocytes Absolute: 0.6 10*3/uL (ref 0.1–1.0)
NEUTROS PCT: 72 %
Neutro Abs: 4.8 10*3/uL (ref 1.7–7.7)
PLATELETS: 140 10*3/uL — AB (ref 150–400)
RBC: 3.22 MIL/uL — ABNORMAL LOW (ref 4.22–5.81)
RDW: 16.8 % — ABNORMAL HIGH (ref 11.5–15.5)
WBC: 6.7 10*3/uL (ref 4.0–10.5)
nRBC: 0 % (ref 0.0–0.2)

## 2018-08-20 LAB — I-STAT TROPONIN, ED: Troponin i, poc: 0.07 ng/mL (ref 0.00–0.08)

## 2018-08-20 LAB — I-STAT CHEM 8, ED
BUN: 78 mg/dL — AB (ref 6–20)
CREATININE: 14.9 mg/dL — AB (ref 0.61–1.24)
Calcium, Ion: 1.02 mmol/L — ABNORMAL LOW (ref 1.15–1.40)
Chloride: 107 mmol/L (ref 98–111)
GLUCOSE: 96 mg/dL (ref 70–99)
HCT: 30 % — ABNORMAL LOW (ref 39.0–52.0)
HEMOGLOBIN: 10.2 g/dL — AB (ref 13.0–17.0)
POTASSIUM: 6.5 mmol/L — AB (ref 3.5–5.1)
Sodium: 135 mmol/L (ref 135–145)
TCO2: 22 mmol/L (ref 22–32)

## 2018-08-20 MED ORDER — CHLORHEXIDINE GLUCONATE CLOTH 2 % EX PADS: 6.0000 | MEDICATED_PAD | Freq: Every day | CUTANEOUS | Status: DC

## 2018-08-20 MED ORDER — ALBUTEROL SULFATE (2.5 MG/3ML) 0.083% IN NEBU
5.0000 mg | INHALATION_SOLUTION | Freq: Once | RESPIRATORY_TRACT | Status: AC
  Administered 2018-08-20: 5 mg via RESPIRATORY_TRACT
  Filled 2018-08-20: qty 6

## 2018-08-20 NOTE — Progress Notes (Signed)
RT note-Patient transferred to Dialysis on Bipap.
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
## 2018-08-20 NOTE — ED Notes (Signed)
Pt is pouring sweat; retrations. RT setting up BIPAP.

## 2018-08-20 NOTE — ED Triage Notes (Signed)
Pt BIB EMS from home. C/o central CP, SOB. Began around 2100 last night, went away, then returned around 0600 this AM after waking. Dialysis pt, TuThSa. Should be getting it today. Received normal treatment on Sat. Pain worse with deep inspiration. 93% on RA. Given 2Lpm via [place], which increase to 96%.

## 2018-08-20 NOTE — ED Notes (Addendum)
2 attempts to stick unsuccessful. MD informed Phlebotomist to come stick.

## 2018-08-20 NOTE — Progress Notes (Signed)
Pt completed tx without any complication; denies pain, n/v, SOB, dizziness or weakness. Pt accompanied off the unit on a wheelchair by Willia Craze, Tech to his awaiting family.

## 2018-08-20 NOTE — ED Notes (Signed)
Pt is breathing much easier- no longer sweating or retracting

## 2018-08-20 NOTE — ED Notes (Addendum)
Report given to Daim, RN on dialysis. Pt to bay 3.

## 2018-11-11 ENCOUNTER — Other Ambulatory Visit: Payer: Self-pay

## 2018-11-11 ENCOUNTER — Non-Acute Institutional Stay (HOSPITAL_COMMUNITY)
Admission: EM | Admit: 2018-11-11 | Discharge: 2018-11-11 | Disposition: A | Discharge status: Home Health | Payer: No Typology Code available for payment source | Attending: Emergency Medicine | Admitting: Emergency Medicine

## 2018-11-11 ENCOUNTER — Emergency Department (HOSPITAL_COMMUNITY): Payer: No Typology Code available for payment source

## 2018-11-11 DIAGNOSIS — Z79899 Other long term (current) drug therapy: Secondary | ICD-10-CM | POA: Insufficient documentation

## 2018-11-11 DIAGNOSIS — F17210 Nicotine dependence, cigarettes, uncomplicated: Secondary | ICD-10-CM | POA: Diagnosis not present

## 2018-11-11 DIAGNOSIS — R9431 Abnormal electrocardiogram [ECG] [EKG]: Secondary | ICD-10-CM | POA: Diagnosis not present

## 2018-11-11 DIAGNOSIS — Z888 Allergy status to other drugs, medicaments and biological substances status: Secondary | ICD-10-CM | POA: Diagnosis not present

## 2018-11-11 DIAGNOSIS — I517 Cardiomegaly: Secondary | ICD-10-CM | POA: Insufficient documentation

## 2018-11-11 DIAGNOSIS — Z7982 Long term (current) use of aspirin: Secondary | ICD-10-CM | POA: Insufficient documentation

## 2018-11-11 DIAGNOSIS — Z992 Dependence on renal dialysis: Secondary | ICD-10-CM

## 2018-11-11 DIAGNOSIS — R0602 Shortness of breath: Secondary | ICD-10-CM | POA: Diagnosis present

## 2018-11-11 DIAGNOSIS — N186 End stage renal disease: Secondary | ICD-10-CM

## 2018-11-11 LAB — CBC WITH DIFFERENTIAL/PLATELET
ABS IMMATURE GRANULOCYTES: 0.01 10*3/uL (ref 0.00–0.07)
BASOS ABS: 0 10*3/uL (ref 0.0–0.1)
Basophils Relative: 0 %
EOS ABS: 0.1 10*3/uL (ref 0.0–0.5)
EOS PCT: 2 %
HCT: 32 % — ABNORMAL LOW (ref 39.0–52.0)
HEMOGLOBIN: 10.4 g/dL — AB (ref 13.0–17.0)
IMMATURE GRANULOCYTES: 0 %
LYMPHS ABS: 1.2 10*3/uL (ref 0.7–4.0)
Lymphocytes Relative: 24 %
MCH: 30.3 pg (ref 26.0–34.0)
MCHC: 32.5 g/dL (ref 30.0–36.0)
MCV: 93.3 fL (ref 80.0–100.0)
MONOS PCT: 13 %
Monocytes Absolute: 0.6 10*3/uL (ref 0.1–1.0)
NEUTROS PCT: 61 %
NRBC: 0 % (ref 0.0–0.2)
Neutro Abs: 3 10*3/uL (ref 1.7–7.7)
Platelets: 138 10*3/uL — ABNORMAL LOW (ref 150–400)
RBC: 3.43 MIL/uL — ABNORMAL LOW (ref 4.22–5.81)
RDW: 15.7 % — AB (ref 11.5–15.5)
WBC: 4.9 10*3/uL (ref 4.0–10.5)

## 2018-11-11 LAB — COMPREHENSIVE METABOLIC PANEL
ALBUMIN: 3.6 g/dL (ref 3.5–5.0)
ALT: 13 U/L (ref 0–44)
AST: 20 U/L (ref 15–41)
Alkaline Phosphatase: 71 U/L (ref 38–126)
Anion gap: 19 — ABNORMAL HIGH (ref 5–15)
BILIRUBIN TOTAL: 1.2 mg/dL (ref 0.3–1.2)
BUN: 61 mg/dL — AB (ref 6–20)
CO2: 22 mmol/L (ref 22–32)
CREATININE: 12.36 mg/dL — AB (ref 0.61–1.24)
Calcium: 9 mg/dL (ref 8.9–10.3)
Chloride: 97 mmol/L — ABNORMAL LOW (ref 98–111)
GFR calc Af Amer: 5 mL/min — ABNORMAL LOW (ref >60)
GFR calc non Af Amer: 4 mL/min — ABNORMAL LOW (ref >60)
GLUCOSE: 80 mg/dL (ref 70–99)
POTASSIUM: 4.2 mmol/L (ref 3.5–5.1)
Sodium: 138 mmol/L (ref 135–145)
Total Protein: 7 g/dL (ref 6.5–8.1)

## 2018-11-11 LAB — I-STAT TROPONIN, ED: Troponin i, poc: 0.05 ng/mL (ref 0.00–0.08)

## 2018-11-11 MED ORDER — SODIUM CHLORIDE 0.9 % IV SOLN: 100.0000 mL | INTRAVENOUS | Status: DC | PRN

## 2018-11-11 MED ORDER — HYDRALAZINE HCL 25 MG PO TABS
100.0000 mg | ORAL_TABLET | Freq: Once | ORAL | Status: AC
  Administered 2018-11-11: 100 mg via ORAL
  Filled 2018-11-11: qty 4

## 2018-11-11 MED ORDER — HEPARIN SODIUM (PORCINE) 1000 UNIT/ML DIALYSIS
20.0000 [IU]/kg | INTRAMUSCULAR | Status: DC | PRN
  Filled 2018-11-11: qty 2

## 2018-11-11 MED ORDER — LIDOCAINE-PRILOCAINE 2.5-2.5 % EX CREA
1.0000 "application " | TOPICAL_CREAM | CUTANEOUS | Status: DC | PRN
  Filled 2018-11-11: qty 5

## 2018-11-11 MED ORDER — HEPARIN SODIUM (PORCINE) 1000 UNIT/ML DIALYSIS
1000.0000 [IU] | INTRAMUSCULAR | Status: DC | PRN
  Filled 2018-11-11: qty 1

## 2018-11-11 MED ORDER — LIDOCAINE HCL (PF) 1 % IJ SOLN: 5.0000 mL | INTRAMUSCULAR | Status: DC | PRN

## 2018-11-11 MED ORDER — CHLORHEXIDINE GLUCONATE CLOTH 2 % EX PADS: 6.0000 | MEDICATED_PAD | Freq: Every day | CUTANEOUS | Status: DC

## 2018-11-11 MED ORDER — LABETALOL HCL 200 MG PO TABS
300.0000 mg | ORAL_TABLET | Freq: Once | ORAL | Status: AC
  Administered 2018-11-11: 300 mg via ORAL
  Filled 2018-11-11: qty 2

## 2018-11-11 MED ORDER — PENTAFLUOROPROP-TETRAFLUOROETH EX AERO
1.0000 "application " | INHALATION_SPRAY | CUTANEOUS | Status: DC | PRN
  Filled 2018-11-11: qty 116

## 2018-11-11 MED ORDER — ALTEPLASE 2 MG IJ SOLR: 2.0000 mg | Freq: Once | INTRAMUSCULAR | Status: DC | PRN

## 2018-11-11 NOTE — ED Triage Notes (Signed)
Pt T, Th, S dialysis pt. Dialysis machine down on Saturday so they only pulled off 2 L. Feeling SOB since Sunday, abdominal cramping, and slight chest pain.

## 2018-11-11 NOTE — Procedures (Signed)
   I was present at this dialysis session, have reviewed the session itself and made  appropriate changes [name] MD [place] pager [phone]   11/11/2018, 4:24 PM
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
## 2018-11-11 NOTE — ED Notes (Signed)
Patient transported to X-ray 

## 2018-11-11 NOTE — Progress Notes (Signed)
Pt presented with SOB.  Left 2 kg above EDW ostensibly due to machine issues at dialysis Saturday.  Plan extra tmt and d/c home.  SOB when supine.  BP elevated.  Still has not picked up his lisinopril (discussed with him last Thursday); encouraged him to do so after d/c today. K 4.2 with 3 K bath- plan extra treatment today for volume goal 4 L.  Then d/c home to return to usual treatment tomorrow.Amalia Hailey, PA-C

## 2018-11-12 LAB — HEPATITIS B SURFACE ANTIGEN: Hepatitis B Surface Ag: NEGATIVE

## 2018-12-23 ENCOUNTER — Ambulatory Visit: Payer: Medicare Other | Admitting: Family

## 2019-01-03 ENCOUNTER — Encounter: Payer: Self-pay | Admitting: Family

## 2019-01-03 ENCOUNTER — Ambulatory Visit: Payer: Medicare Other | Admitting: Family

## 2019-02-07 ENCOUNTER — Telehealth (HOSPITAL_COMMUNITY): Payer: Self-pay | Admitting: Rehabilitation

## 2019-02-07 NOTE — Telephone Encounter (Signed)
The above patient or their representative was contacted and gave the following answers to these questions:         Do you have any of the following symptoms? No  Fever                    Cough                   Shortness of breath  Do  you have any of the following other symptoms? No   muscle pain         vomiting,        diarrhea        rash         weakness        red eye        abdominal pain         bruising          bruising or bleeding              joint pain           severe headache    Have you been in contact with someone who was or has been sick in the past 2 weeks? No  Yes                 Unsure                         Unable to assess   Does the person that you were in contact with have any of the following symptoms? N/a  Cough         shortness of breath           muscle pain         vomiting,            diarrhea            rash            weakness           fever            red eye           abdominal pain           bruising  or  bleeding                joint pain                severe headache               Have you  or someone you have been in contact with traveled internationally in th last month? No       If yes, which countries? n/a   Have you  or someone you have been in contact with traveled outside [place] in th last month? No        If yes, which state and city? N/a   COMMENTS OR ACTION PLAN FOR THIS PATIENT:

## 2019-02-10 ENCOUNTER — Ambulatory Visit (INDEPENDENT_AMBULATORY_CARE_PROVIDER_SITE_OTHER): Payer: Medicare Other | Admitting: Family

## 2019-02-10 ENCOUNTER — Other Ambulatory Visit: Payer: Self-pay | Admitting: *Deleted

## 2019-02-10 ENCOUNTER — Encounter: Payer: Self-pay | Admitting: *Deleted

## 2019-02-10 ENCOUNTER — Encounter: Payer: Self-pay | Admitting: Family

## 2019-02-10 ENCOUNTER — Other Ambulatory Visit: Payer: Self-pay

## 2019-02-10 VITALS — BP 173/100 | HR 63 | Temp 97.9°F | Resp 16 | Ht 75.5 in | Wt 200.0 lb

## 2019-02-10 DIAGNOSIS — N186 End stage renal disease: Secondary | ICD-10-CM

## 2019-02-10 DIAGNOSIS — Z992 Dependence on renal dialysis: Secondary | ICD-10-CM

## 2019-02-10 DIAGNOSIS — T82590A Other mechanical complication of surgically created arteriovenous fistula, initial encounter: Secondary | ICD-10-CM
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
## 2019-02-13 ENCOUNTER — Telehealth: Payer: Self-pay | Admitting: *Deleted

## 2019-02-13 NOTE — Telephone Encounter (Signed)
Spoke to patient and instructed to be at [facility] admitting at 7 am on 4/ for procedure with Dr. Carlis Abbott. All other instructions unchanged. Verbalized understanding.

## 2019-02-14 ENCOUNTER — Ambulatory Visit (HOSPITAL_COMMUNITY)
Admission: RE | Admit: 2019-02-14 | Discharge: 2019-02-14 | Disposition: A | Discharge status: Home / Self Care | Payer: Medicare Other | Attending: Vascular Surgery | Admitting: Vascular Surgery

## 2019-02-14 ENCOUNTER — Encounter (HOSPITAL_COMMUNITY): Payer: Self-pay | Admitting: Vascular Surgery

## 2019-02-14 ENCOUNTER — Other Ambulatory Visit: Payer: Self-pay

## 2019-02-14 ENCOUNTER — Other Ambulatory Visit: Payer: Self-pay | Admitting: *Deleted

## 2019-02-14 ENCOUNTER — Encounter (HOSPITAL_COMMUNITY)
Admission: RE | Disposition: A | Discharge status: Home / Self Care | Payer: Self-pay | Source: Home / Self Care | Attending: Vascular Surgery

## 2019-02-14 DIAGNOSIS — I120 Hypertensive chronic kidney disease with stage 5 chronic kidney disease or end stage renal disease: Secondary | ICD-10-CM | POA: Diagnosis not present

## 2019-02-14 DIAGNOSIS — Y841 Kidney dialysis as the cause of abnormal reaction of the patient, or of later complication, without mention of misadventure at the time of the procedure: Secondary | ICD-10-CM | POA: Diagnosis not present

## 2019-02-14 DIAGNOSIS — F17210 Nicotine dependence, cigarettes, uncomplicated: Secondary | ICD-10-CM | POA: Diagnosis not present

## 2019-02-14 DIAGNOSIS — T82898A Other specified complication of vascular prosthetic devices, implants and grafts, initial encounter: Secondary | ICD-10-CM

## 2019-02-14 DIAGNOSIS — N186 End stage renal disease: Secondary | ICD-10-CM | POA: Insufficient documentation

## 2019-02-14 DIAGNOSIS — F329 Major depressive disorder, single episode, unspecified: Secondary | ICD-10-CM | POA: Insufficient documentation

## 2019-02-14 DIAGNOSIS — Z7982 Long term (current) use of aspirin: Secondary | ICD-10-CM | POA: Insufficient documentation

## 2019-02-14 DIAGNOSIS — Z8673 Personal history of transient ischemic attack (TIA), and cerebral infarction without residual deficits: Secondary | ICD-10-CM | POA: Diagnosis not present

## 2019-02-14 DIAGNOSIS — Z992 Dependence on renal dialysis: Secondary | ICD-10-CM | POA: Insufficient documentation

## 2019-02-14 DIAGNOSIS — Z79899 Other long term (current) drug therapy: Secondary | ICD-10-CM | POA: Diagnosis not present

## 2019-02-14 DIAGNOSIS — T82868A Thrombosis of vascular prosthetic devices, implants and grafts, initial encounter: Secondary | ICD-10-CM | POA: Diagnosis not present

## 2019-02-14 HISTORY — PX: A/V SHUNTOGRAM: CATH118297

## 2019-02-14 LAB — POCT I-STAT 4, (NA,K, GLUC, HGB,HCT)
Glucose, Bld: 130 mg/dL — ABNORMAL HIGH (ref 70–99)
HCT: 35 % — ABNORMAL LOW (ref 39.0–52.0)
Hemoglobin: 11.9 g/dL — ABNORMAL LOW (ref 13.0–17.0)
Potassium: 4.5 mmol/L (ref 3.5–5.1)
Sodium: 137 mmol/L (ref 135–145)

## 2019-02-14 SURGERY — A/V SHUNTOGRAM
Anesthesia: LOCAL | Laterality: Right

## 2019-02-14 MED ORDER — LIDOCAINE HCL (PF) 1 % IJ SOLN
INTRAMUSCULAR | Status: DC | PRN
  Administered 2019-02-14: 2 mL via INTRADERMAL

## 2019-02-14 MED ORDER — IODIXANOL 320 MG/ML IV SOLN
INTRAVENOUS | Status: DC | PRN
  Administered 2019-02-14: 09:00:00 75 mL via INTRAVENOUS

## 2019-02-14 MED ORDER — LIDOCAINE HCL (PF) 1 % IJ SOLN
INTRAMUSCULAR | Status: AC
  Filled 2019-02-14: qty 30

## 2019-02-14 MED ORDER — HEPARIN (PORCINE) IN NACL 1000-0.9 UT/500ML-% IV SOLN
INTRAVENOUS | Status: AC
  Filled 2019-02-14: qty 500

## 2019-02-14 MED ORDER — HYDRALAZINE HCL 20 MG/ML IJ SOLN
INTRAMUSCULAR | Status: AC
  Filled 2019-02-14: qty 1

## 2019-02-14 MED ORDER — HEPARIN (PORCINE) IN NACL 1000-0.9 UT/500ML-% IV SOLN
INTRAVENOUS | Status: DC | PRN
  Administered 2019-02-14: 500 mL

## 2019-02-14 MED ORDER — HYDRALAZINE HCL 20 MG/ML IJ SOLN
INTRAMUSCULAR | Status: DC | PRN
  Administered 2019-02-14 (×2): 10 mg via INTRAVENOUS

## 2019-02-14 MED ORDER — SODIUM CHLORIDE 0.9% FLUSH: 3.0000 mL | INTRAVENOUS | Status: DC | PRN

## 2019-02-14 SURGICAL SUPPLY — 9 items

## 2019-02-14 NOTE — Discharge Instructions (Signed)

## 2019-02-14 NOTE — Op Note (Signed)
° ° °  OPERATIVE NOTE   PROCEDURE: 1. Right forearm arteriovenous graft cannulation under ultrasound guidance 2. Right arm fistulogram  PRE-OPERATIVE DIAGNOSIS: Malfunctioning right arteriovenous forearm loop graft  POST-OPERATIVE DIAGNOSIS: same as above   SURGEON: [name], MD  ANESTHESIA: local  ESTIMATED BLOOD LOSS: 5 cc  FINDING(S): 1. Right forearm loop graft was widely patent and I saw no evidence of flow-limiting stenosis at either the arterial or venous anastomosis.  The graft had an excellent thrill.  Venous outflow in the upper arm was via the deep brachial system that was widely patent and there was no evidence of flow-limiting stenosis in the central system either.  He does have at least 5 pseudoaneurysms along the arterial limb side of the forearm loop graft and several of these are quite large.  SPECIMEN(S):  None  CONTRAST: 75 cc  INDICATIONS: Frank Fuller is a 58 y.o. male who  presents with malfunctioning right forearm arteriovenous graft.  The patient is scheduled for right arm fistulogram.  The patient is aware the risks include but are not limited to: bleeding, infection, thrombosis of the cannulated access, and possible anaphylactic reaction to the contrast.  The patient is aware of the risks of the procedure and elects to proceed forward.  DESCRIPTION: After full informed written consent was obtained, the patient was brought back to the angiography suite and placed supine upon the angiography table.  The patient was connected to monitoring equipment.  The right arm was prepped and draped in the standard fashion for a right arm fistulogram.  Under ultrasound guidance, graft was evaluated, it was patent, an image was saved.  The right forearm arteriovenous graft was cannulated with a micropuncture needle under ultrasound guidance.  The microwire was advanced into the fistula and the needle was exchanged for the a microsheath, which was lodged 2 cm into  the access.  The wire was removed and the sheath was connected to the IV extension tubing.  Hand injections were completed to image the acces.  We had a inflatable blood pressure cuff on the upper arm in order to get an adequate reflux shot to evaluate the arterial anastomosis.  Ultimately I did not see any focal evidence of flow-limiting stenosis at either the arterial or venous anastomosis.  The outflow of the deep brachial system the upper arm was widely patent and there was no central stenosis.  The patient does have at least 5 pseudoaneurysms along the arterial limb of the forearm loop graft.  Ultimately a 4 oh pursestring was placed and the micro sheath was removed.  Pressure was held.  Hemostasis.  COMPLICATIONS: None  CONDITION: Stable  Plan: Patient is anxious to have his forearm loop graft revised given that he feels the pseudoaneurysms have been rapidly degenerating recently and it is limiting dialysis center ability to access graft.  The skin has fairly good integrity although does appear to be thinning over one of the pseudoaneurysms.  I will look at the schedule and find time to get him revised in the near future on a nondialysis day likely with interposition graft tunneled laterally.  [name], MD [facility] Office: [phone] Pager: [phone]  02/14/2019 9:16 AM
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
## 2019-02-28 ENCOUNTER — Other Ambulatory Visit: Payer: Self-pay

## 2019-02-28 ENCOUNTER — Encounter (HOSPITAL_COMMUNITY): Payer: Self-pay | Admitting: *Deleted

## 2019-02-28 NOTE — Progress Notes (Addendum)
Spoke with pt for pre-op call. Pt denies any cardiac history, chest pain or sob. Pt does have a hx of HTN with a "mini stroke" a couple of years ago. Pt states he is not diabetic.   Coronavirus Screening  Have you experienced the following symptoms:  Cough NO Fever (>100.68F) NO Runny nose NO Sore throat NO Difficulty breathing/shortness of breath  NO  Have you or a family member traveled in the last 14 days and where? NO    Patient reminded that hospital visitation restrictions are in effect and the importance of the restrictions.

## 2019-03-03 ENCOUNTER — Ambulatory Visit (HOSPITAL_COMMUNITY)
Admission: RE | Admit: 2019-03-03 | Discharge: 2019-03-03 | Disposition: A | Discharge status: Home / Self Care | Payer: Medicare Other | Attending: Vascular Surgery | Admitting: Vascular Surgery

## 2019-03-03 ENCOUNTER — Ambulatory Visit (HOSPITAL_COMMUNITY): Payer: Medicare Other | Admitting: Certified Registered"

## 2019-03-03 ENCOUNTER — Encounter (HOSPITAL_COMMUNITY): Payer: Self-pay

## 2019-03-03 ENCOUNTER — Telehealth: Payer: Self-pay

## 2019-03-03 ENCOUNTER — Encounter (HOSPITAL_COMMUNITY)
Admission: RE | Disposition: A | Discharge status: Home / Self Care | Payer: Self-pay | Source: Home / Self Care | Attending: Vascular Surgery

## 2019-03-03 ENCOUNTER — Other Ambulatory Visit: Payer: Self-pay

## 2019-03-03 DIAGNOSIS — F17210 Nicotine dependence, cigarettes, uncomplicated: Secondary | ICD-10-CM | POA: Diagnosis not present

## 2019-03-03 DIAGNOSIS — Z8673 Personal history of transient ischemic attack (TIA), and cerebral infarction without residual deficits: Secondary | ICD-10-CM | POA: Insufficient documentation

## 2019-03-03 DIAGNOSIS — F329 Major depressive disorder, single episode, unspecified: Secondary | ICD-10-CM | POA: Diagnosis not present

## 2019-03-03 DIAGNOSIS — Z79899 Other long term (current) drug therapy: Secondary | ICD-10-CM | POA: Insufficient documentation

## 2019-03-03 DIAGNOSIS — Z1159 Encounter for screening for other viral diseases: Secondary | ICD-10-CM | POA: Insufficient documentation

## 2019-03-03 DIAGNOSIS — I132 Hypertensive heart and chronic kidney disease with heart failure and with stage 5 chronic kidney disease, or end stage renal disease: Secondary | ICD-10-CM | POA: Diagnosis not present

## 2019-03-03 DIAGNOSIS — Z992 Dependence on renal dialysis: Secondary | ICD-10-CM | POA: Insufficient documentation

## 2019-03-03 DIAGNOSIS — T82898A Other specified complication of vascular prosthetic devices, implants and grafts, initial encounter: Secondary | ICD-10-CM | POA: Diagnosis present

## 2019-03-03 DIAGNOSIS — B1920 Unspecified viral hepatitis C without hepatic coma: Secondary | ICD-10-CM | POA: Insufficient documentation

## 2019-03-03 DIAGNOSIS — Z8249 Family history of ischemic heart disease and other diseases of the circulatory system: Secondary | ICD-10-CM | POA: Insufficient documentation

## 2019-03-03 DIAGNOSIS — Z7982 Long term (current) use of aspirin: Secondary | ICD-10-CM | POA: Diagnosis not present

## 2019-03-03 DIAGNOSIS — X58XXXA Exposure to other specified factors, initial encounter: Secondary | ICD-10-CM | POA: Insufficient documentation

## 2019-03-03 DIAGNOSIS — N186 End stage renal disease: Secondary | ICD-10-CM | POA: Insufficient documentation

## 2019-03-03 DIAGNOSIS — I272 Pulmonary hypertension, unspecified: Secondary | ICD-10-CM | POA: Insufficient documentation

## 2019-03-03 DIAGNOSIS — Z888 Allergy status to other drugs, medicaments and biological substances status: Secondary | ICD-10-CM | POA: Insufficient documentation

## 2019-03-03 DIAGNOSIS — H409 Unspecified glaucoma: Secondary | ICD-10-CM | POA: Insufficient documentation

## 2019-03-03 DIAGNOSIS — I509 Heart failure, unspecified: Secondary | ICD-10-CM | POA: Diagnosis not present

## 2019-03-03 HISTORY — PX: REVISION OF ARTERIOVENOUS GORETEX GRAFT: SHX6073

## 2019-03-03 LAB — SARS CORONAVIRUS 2 BY RT PCR (HOSPITAL ORDER, PERFORMED IN ~~LOC~~ HOSPITAL LAB): SARS Coronavirus 2: NEGATIVE

## 2019-03-03 LAB — POCT I-STAT 4, (NA,K, GLUC, HGB,HCT)
Glucose, Bld: 79 mg/dL (ref 70–99)
HCT: 32 % — ABNORMAL LOW (ref 39.0–52.0)
Hemoglobin: 10.9 g/dL — ABNORMAL LOW (ref 13.0–17.0)
Potassium: 5.3 mmol/L — ABNORMAL HIGH (ref 3.5–5.1)
Sodium: 136 mmol/L (ref 135–145)

## 2019-03-03 SURGERY — REVISION OF ARTERIOVENOUS GORETEX GRAFT
Anesthesia: General | Site: Arm Upper | Laterality: Right

## 2019-03-03 MED ORDER — LABETALOL HCL 100 MG PO TABS
100.0000 mg | ORAL_TABLET | ORAL | Status: AC
  Administered 2019-03-03: 100 mg via ORAL
  Filled 2019-03-03: qty 1

## 2019-03-03 MED ORDER — AMLODIPINE BESYLATE 10 MG PO TABS
10.0000 mg | ORAL_TABLET | ORAL | Status: AC
  Administered 2019-03-03: 11:00:00 10 mg via ORAL
  Filled 2019-03-03: qty 1

## 2019-03-03 MED ORDER — 0.9 % SODIUM CHLORIDE (POUR BTL) OPTIME
TOPICAL | Status: DC | PRN
  Administered 2019-03-03: 1000 mL

## 2019-03-03 MED ORDER — ONDANSETRON HCL 4 MG/2ML IJ SOLN: 4.0000 mg | Freq: Once | INTRAMUSCULAR | Status: DC | PRN

## 2019-03-03 MED ORDER — MIDAZOLAM HCL 5 MG/5ML IJ SOLN
INTRAMUSCULAR | Status: DC | PRN
  Administered 2019-03-03: 1 mg via INTRAVENOUS

## 2019-03-03 MED ORDER — DEXAMETHASONE SODIUM PHOSPHATE 10 MG/ML IJ SOLN
INTRAMUSCULAR | Status: DC | PRN
  Administered 2019-03-03: 5 mg via INTRAVENOUS

## 2019-03-03 MED ORDER — GLYCOPYRROLATE 0.2 MG/ML IJ SOLN
INTRAMUSCULAR | Status: DC | PRN
  Administered 2019-03-03: 0.1 mg via INTRAVENOUS

## 2019-03-03 MED ORDER — LIDOCAINE HCL 1 % IJ SOLN
INTRAMUSCULAR | Status: DC | PRN
  Administered 2019-03-03: 10 mL

## 2019-03-03 MED ORDER — SODIUM CHLORIDE 0.9 % IV SOLN
INTRAVENOUS | Status: DC
  Administered 2019-03-03: 09:00:00 via INTRAVENOUS

## 2019-03-03 MED ORDER — PROTAMINE SULFATE 10 MG/ML IV SOLN
INTRAVENOUS | Status: DC | PRN
  Administered 2019-03-03: 20 mg via INTRAVENOUS

## 2019-03-03 MED ORDER — FENTANYL CITRATE (PF) 250 MCG/5ML IJ SOLN
INTRAMUSCULAR | Status: DC | PRN
  Administered 2019-03-03 (×5): 25 ug via INTRAVENOUS

## 2019-03-03 MED ORDER — LABETALOL HCL 5 MG/ML IV SOLN
10.0000 mg | Freq: Once | INTRAVENOUS | Status: AC
  Administered 2019-03-03: 10 mg via INTRAVENOUS

## 2019-03-03 MED ORDER — MIDAZOLAM HCL 2 MG/2ML IJ SOLN
INTRAMUSCULAR | Status: AC
  Filled 2019-03-03: qty 2

## 2019-03-03 MED ORDER — LIDOCAINE 2% (20 MG/ML) 5 ML SYRINGE
INTRAMUSCULAR | Status: DC | PRN
  Administered 2019-03-03: 60 mg via INTRAVENOUS

## 2019-03-03 MED ORDER — LABETALOL HCL 5 MG/ML IV SOLN
INTRAVENOUS | Status: AC
  Administered 2019-03-03: 10 mg via INTRAVENOUS
  Filled 2019-03-03: qty 4

## 2019-03-03 MED ORDER — FENTANYL CITRATE (PF) 100 MCG/2ML IJ SOLN: 25.0000 ug | INTRAMUSCULAR | Status: DC | PRN

## 2019-03-03 MED ORDER — SODIUM CHLORIDE 0.9 % IV SOLN
INTRAVENOUS | Status: DC | PRN
  Administered 2019-03-03: 12:00:00

## 2019-03-03 MED ORDER — HEMOSTATIC AGENTS (NO CHARGE) OPTIME
TOPICAL | Status: DC | PRN
  Administered 2019-03-03: 1 via TOPICAL

## 2019-03-03 MED ORDER — PROPOFOL 10 MG/ML IV BOLUS
INTRAVENOUS | Status: AC
  Filled 2019-03-03: qty 40

## 2019-03-03 MED ORDER — ONDANSETRON HCL 4 MG/2ML IJ SOLN
INTRAMUSCULAR | Status: DC | PRN
  Administered 2019-03-03: 4 mg via INTRAVENOUS

## 2019-03-03 MED ORDER — PROPOFOL 10 MG/ML IV BOLUS
INTRAVENOUS | Status: DC | PRN
  Administered 2019-03-03: 50 mg via INTRAVENOUS
  Administered 2019-03-03: 150 mg via INTRAVENOUS

## 2019-03-03 MED ORDER — CEFAZOLIN SODIUM-DEXTROSE 2-4 GM/100ML-% IV SOLN
2.0000 g | INTRAVENOUS | Status: AC
  Administered 2019-03-03: 2 g via INTRAVENOUS
  Filled 2019-03-03: qty 100

## 2019-03-03 MED ORDER — HYDRALAZINE HCL 20 MG/ML IJ SOLN
INTRAMUSCULAR | Status: DC | PRN
  Administered 2019-03-03 (×2): 10 mg via INTRAVENOUS

## 2019-03-03 MED ORDER — OXYCODONE HCL 5 MG/5ML PO SOLN: 5.0000 mg | Freq: Once | ORAL | Status: DC | PRN

## 2019-03-03 MED ORDER — HEPARIN SODIUM (PORCINE) 1000 UNIT/ML IJ SOLN
INTRAMUSCULAR | Status: DC | PRN
  Administered 2019-03-03: 3000 [IU] via INTRAVENOUS

## 2019-03-03 MED ORDER — HYDROCODONE-ACETAMINOPHEN 5-325 MG PO TABS: 1.0000 | ORAL_TABLET | Freq: Four times a day (QID) | ORAL | 0 refills | Status: DC | PRN

## 2019-03-03 MED ORDER — SODIUM CHLORIDE 0.9 % IV SOLN
INTRAVENOUS | Status: DC | PRN
  Administered 2019-03-03: 20 ug/min via INTRAVENOUS

## 2019-03-03 MED ORDER — OXYCODONE HCL 5 MG PO TABS: 5.0000 mg | ORAL_TABLET | Freq: Once | ORAL | Status: DC | PRN

## 2019-03-03 MED ORDER — SODIUM CHLORIDE 0.9 % IV SOLN
INTRAVENOUS | Status: AC
  Filled 2019-03-03: qty 1.2

## 2019-03-03 MED ORDER — HYDRALAZINE HCL 20 MG/ML IJ SOLN
INTRAMUSCULAR | Status: AC
  Filled 2019-03-03: qty 1

## 2019-03-03 MED ORDER — LABETALOL HCL 5 MG/ML IV SOLN
INTRAVENOUS | Status: DC | PRN
  Administered 2019-03-03 (×2): 10 mg via INTRAVENOUS

## 2019-03-03 MED ORDER — FENTANYL CITRATE (PF) 250 MCG/5ML IJ SOLN
INTRAMUSCULAR | Status: AC
  Filled 2019-03-03: qty 5

## 2019-03-03 MED ORDER — LIDOCAINE HCL (PF) 1 % IJ SOLN
INTRAMUSCULAR | Status: AC
  Filled 2019-03-03: qty 30

## 2019-03-03 SURGICAL SUPPLY — 36 items
ARMBAND PINK RESTRICT EXTREMIT (MISCELLANEOUS) ×3 IMPLANT
BANDAGE ELASTIC 4 VELCRO ST LF (GAUZE/BANDAGES/DRESSINGS) ×3 IMPLANT
CANISTER SUCT 3000ML PPV (MISCELLANEOUS) ×3 IMPLANT
CLIP VESOCCLUDE MED 6/CT (CLIP) ×3 IMPLANT
CLIP VESOCCLUDE SM WIDE 6/CT (CLIP) ×3 IMPLANT
COVER PROBE W GEL 5X96 (DRAPES) IMPLANT
COVER WAND RF STERILE (DRAPES) IMPLANT
DECANTER SPIKE VIAL GLASS SM (MISCELLANEOUS) ×3 IMPLANT
DERMABOND ADVANCED (GAUZE/BANDAGES/DRESSINGS) ×2
DERMABOND ADVANCED .7 DNX12 (GAUZE/BANDAGES/DRESSINGS) ×1 IMPLANT
ELECT REM PT RETURN 9FT ADLT (ELECTROSURGICAL) ×3
ELECTRODE REM PT RTRN 9FT ADLT (ELECTROSURGICAL) ×1 IMPLANT
GLOVE BIO SURGEON STRL SZ7.5 (GLOVE) ×6 IMPLANT
GLOVE BIOGEL PI IND STRL 8 (GLOVE) ×1 IMPLANT
GLOVE BIOGEL PI INDICATOR 8 (GLOVE) ×2
GOWN STRL REUS W/ TWL LRG LVL3 (GOWN DISPOSABLE) IMPLANT
GOWN STRL REUS W/ TWL XL LVL3 (GOWN DISPOSABLE) ×4 IMPLANT
GOWN STRL REUS W/TWL LRG LVL3 (GOWN DISPOSABLE)
GOWN STRL REUS W/TWL XL LVL3 (GOWN DISPOSABLE) ×8
GRAFT GORETEX 6X40 (Vascular Products) ×3 IMPLANT
HEMOSTAT SNOW SURGICEL 2X4 (HEMOSTASIS) ×3 IMPLANT
HEMOSTAT SPONGE AVITENE ULTRA (HEMOSTASIS) IMPLANT
KIT BASIN OR (CUSTOM PROCEDURE TRAY) ×3 IMPLANT
KIT TURNOVER KIT B (KITS) ×3 IMPLANT
NEEDLE HYPO 25GX1X1/2 BEV (NEEDLE) IMPLANT
NS IRRIG 1000ML POUR BTL (IV SOLUTION) ×3 IMPLANT
PACK CV ACCESS (CUSTOM PROCEDURE TRAY) ×3 IMPLANT
PAD ARMBOARD 7.5X6 YLW CONV (MISCELLANEOUS) ×6 IMPLANT
SUT MNCRL AB 4-0 PS2 18 (SUTURE) ×3 IMPLANT
SUT PROLENE 6 0 BV (SUTURE) ×6 IMPLANT
SUT PROLENE 7 0 BV 1 (SUTURE) IMPLANT
SUT VIC AB 3-0 SH 27 (SUTURE) ×4
SUT VIC AB 3-0 SH 27X BRD (SUTURE) ×2 IMPLANT
TOWEL GREEN STERILE (TOWEL DISPOSABLE) ×3 IMPLANT
UNDERPAD 30X30 (UNDERPADS AND DIAPERS) ×3 IMPLANT
WATER STERILE IRR 1000ML POUR (IV SOLUTION) IMPLANT
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
## 2019-03-03 NOTE — Anesthesia Postprocedure Evaluation (Signed)
Anesthesia Post Note  Patient: Frank Fuller  Procedure(s) Performed: REVISION OF ARTERIOVENOUS GORETEX GRAFT RIGHT FOREARM (Right Arm Upper)     Patient location during evaluation: PACU Anesthesia Type: General Level of consciousness: awake and alert Pain management: pain level controlled Vital Signs Assessment: post-procedure vital signs reviewed and stable Respiratory status: spontaneous breathing, nonlabored ventilation and respiratory function stable Cardiovascular status: blood pressure returned to baseline and stable Postop Assessment: no apparent nausea or vomiting Anesthetic complications: no    Last Vitals:  Vitals:   03/03/19 1335 03/03/19 1350  BP: (!) 155/108 (!) 162/102  Pulse: 71 73  Resp: 13 12  Temp:  36.4 C  SpO2: 95% 93%    Last Pain:  Vitals:   03/03/19 1350  TempSrc:   PainSc: 0-No pain                 Lidia Collum

## 2019-03-03 NOTE — Addendum Note (Signed)
Addendum  created 03/03/19 1452 by Cleda Daub, CRNA   Intraprocedure Flowsheets edited

## 2019-03-03 NOTE — Anesthesia Procedure Notes (Signed)
Procedure Name: LMA Insertion Date/Time: 03/03/2019 11:50 AM Performed by: Cleda Daub, CRNA Pre-anesthesia Checklist: Patient identified, Emergency Drugs available, Suction available and Patient being monitored Patient Re-evaluated:Patient Re-evaluated prior to induction Oxygen Delivery Method: Circle system utilized Preoxygenation: Pre-oxygenation with 100% oxygen Induction Type: IV induction LMA: LMA inserted LMA Size: 5.0 Number of attempts: 1 Placement Confirmation: positive ETCO2 Tube secured with: Tape Dental Injury: Teeth and Oropharynx as per pre-operative assessment

## 2019-03-03 NOTE — Anesthesia Preprocedure Evaluation (Addendum)
Anesthesia Evaluation  Patient identified by MRN, date of birth, ID band Patient awake    Reviewed: Allergy & Precautions, NPO status , Patient's Chart, lab work & pertinent test results, reviewed documented beta blocker date and time   History of Anesthesia Complications Negative for: history of anesthetic complications  Airway Mallampati: III  TM Distance: >3 FB Neck ROM: Full    Dental  (+) Edentulous Upper   Pulmonary neg pulmonary ROS, Current Smoker,    Pulmonary exam normal        Cardiovascular hypertension, Pt. on medications and Pt. on home beta blockers +CHF  Normal cardiovascular exam     Neuro/Psych CVA, No Residual Symptoms negative psych ROS   GI/Hepatic negative GI ROS, (+) Hepatitis -, C  Endo/Other  negative endocrine ROS  Renal/GU ESRF and DialysisRenal disease  negative genitourinary   Musculoskeletal negative musculoskeletal ROS (+)   Abdominal   Peds  Hematology negative hematology ROS (+)   Anesthesia Other Findings 58 yo M dialysis access revision - CVA (11/2016), HTN, CHF, pulm HTN, HCV, ESRD on HD - Echo 07/2017: EF 45-50%, grade 2 dd, mild MR, mild TR, PASP 58  Reproductive/Obstetrics                           Anesthesia Physical Anesthesia Plan  ASA: IV  Anesthesia Plan: General   Post-op Pain Management:    Induction: Intravenous  PONV Risk Score and Plan: 1 and Ondansetron, Dexamethasone and Treatment may vary due to age or medical condition  Airway Management Planned: LMA  Additional Equipment: None  Intra-op Plan:   Post-operative Plan: Extubation in OR  Informed Consent: I have reviewed the patients History and Physical, chart, labs and discussed the procedure including the risks, benefits and alternatives for the proposed anesthesia with the patient or authorized representative who has indicated his/her understanding and acceptance.      Dental advisory given  Plan Discussed with:   Anesthesia Plan Comments:        Anesthesia Quick Evaluation

## 2019-03-03 NOTE — Transfer of Care (Signed)
Immediate Anesthesia Transfer of Care Note  Patient: Frank Fuller  Procedure(s) Performed: REVISION OF ARTERIOVENOUS GORETEX GRAFT RIGHT FOREARM (Right Arm Upper)  Patient Location: PACU  Anesthesia Type:General  Level of Consciousness: drowsy and patient cooperative  Airway & Oxygen Therapy: Patient Spontanous Breathing and Patient connected to face mask oxygen  Post-op Assessment: Report given to RN and Post -op Vital signs reviewed and stable  Post vital signs: Reviewed and stable  Last Vitals:  Vitals Value Taken Time  BP 170/107 03/03/2019  1:20 PM  Temp 36.2 C 03/03/2019  1:20 PM  Pulse 75 03/03/2019  1:23 PM  Resp 22 03/03/2019  1:23 PM  SpO2 100 % 03/03/2019  1:23 PM  Vitals shown include unvalidated device data.  Last Pain:  Vitals:   03/03/19 0916  TempSrc:   PainSc: 0-No pain      Patients Stated Pain Goal: 4 (38/37/79 [id number])  Complications: No apparent anesthesia complications

## 2019-03-03 NOTE — Progress Notes (Signed)
Dr. Christella Hartigan made aware of pt's elevated bp's. New orders received.

## 2019-03-03 NOTE — Op Note (Signed)
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  Revision of right upper extremity forearm AV loop graft with 6 mm Gore-Tex interposition  Surgeon: Dr. [name], MD  Assistant: Arlee Muslim, PA  Indications: Patient is a 58 year old male with end-stage renal disease that has been dialyzing via a right forearm loop graft.  He was ultimately referred to our clinic for further evaluation of multiple pseudoaneurysms degenerating along the arterial limb of the graft.  Ultimately performed right upper extremity fistulogram with no overt stenosis identified.  He presents today for forearm graft revision after risks and benefits were discussed.  Findings: 6 mm Gore-Tex interposition in the right forearm with exclusion of at least 3 large pseudoaneurysms and excellent thrill in the graft at completion of the case.  Anesthesia: LMA  Details: The patient was taken to the operating room after informed consent was obtained.  He was placed on the operating table in supine position and his right arm was prepped and draped in usual sterile fashion.  A preop timeout was performed to identify patient, procedure and site.  After his timeout was performed closely examined his right forearm and identified where the three large pseudoaneurysms were located on the arterial side of the forearm graft (medially).  Ultimately made a transverse incision in the proximal forearm just before the pseudoaneurysms and then another transverse incision over the graft distally in the distal forearm.  Through each of these incisions I then dissected out with Bovie cautery and blunt dissection and got Vesseloops around the graft.  I then brought a straight tunneler on the field and instead of trying to tunnel the new graft limb laterally where I thought it would be out of anatomic position and difficult to cannulate I  decided to tunnel medial.  I then used a 6 mm Gore-Tex graft that was then passed through the tunneler and the tunneler was removed.  Patient was then given 3000 units of IV heparin.  I then used a fistula clamp and clamped proximally at our first transverse incision in the proximal forearm and then placed another fistula clamp in the distal forearm incision near where the graft formed its loop in the distal forearm.  I then spatulated the graft and the anastomosis was sewn in end to end fashion with a  running 6-0 Prolene.  I then released the first vascular clamp and with excellent pulsatile flow in the new interposition limb.  I then pulled to appropriate length and cut the graft to apporiate length since it had already been tunneled.  The distal anastomosis was sewn end to end with a running 6-0 Prolene.  There was excellent thrill in the graft.  At that point time patient was given protamine for reversal.  Both wounds were then copiously irrigated.  Surgicel snow was used for hemostasis.  The subcutaneous tissue was closed with 3-0 Vicryl in each incision.  Skin was run closed with 4-0 Monocryl.  Dermabond was applied.  Complications: None  Condition: Stable  [name], MD [facility] Office: [phone] Pager: [place]

## 2019-03-03 NOTE — Telephone Encounter (Signed)
Kim with [facility] called this morning to let us know that there was an issue with his authorization for surgery. She said that it had expired this weekend as the 30 days had just ended.   She asked that I call Keri to initiate a new approval.   Called Keri and gave her the information for surgery and she has entered in the new data and will fax over the new approval when it gets back.   York Cerise, CMA

## 2019-03-03 NOTE — Discharge Instructions (Signed)
° °  [facility] ° °Discharge Instructions ° °AV Fistula or Graft Surgery for Dialysis Access ° °Please refer to the following instructions for your post-procedure care. Your surgeon or physician assistant will discuss any changes with you. ° °Activity ° °You may drive the day following your surgery, if you are comfortable and no longer taking prescription pain medication. Resume full activity as the soreness in your incision resolves. ° °Bathing/Showering ° °You may shower after you go home. Keep your incision dry for 48 hours. Do not soak in a bathtub, hot tub, or swim until the incision heals completely. You may not shower if you have a hemodialysis catheter. ° °Incision Care ° °Clean your incision with mild soap and water after 48 hours. Pat the area dry with a clean towel. You do not need a bandage unless otherwise instructed. Do not apply any ointments or creams to your incision. You may have skin glue on your incision. Do not peel it off. It will come off on its own in about one week. Your arm may swell a bit after surgery. To reduce swelling use pillows to elevate your arm so it is above your heart. Your doctor will tell you if you need to lightly wrap your arm with an ACE bandage. ° °Diet ° °Resume your normal diet. There are not special food restrictions following this procedure. In order to heal from your surgery, it is CRITICAL to get adequate nutrition. Your body requires vitamins, minerals, and protein. Vegetables are the best source of vitamins and minerals. Vegetables also provide the perfect balance of protein. Processed food has little nutritional value, so try to avoid this. ° °Medications ° °Resume taking all of your medications. If your incision is causing pain, you may take over-the counter pain relievers such as acetaminophen (Tylenol). If you were prescribed a stronger pain medication, please be aware these medications can cause nausea and constipation. Prevent  nausea by taking the medication with a snack or meal. Avoid constipation by drinking plenty of fluids and eating foods with high amount of fiber, such as fruits, vegetables, and grains. Do not take Tylenol if you are taking prescription pain medications. ° ° ° ° °Follow up °Your surgeon may want to see you in the office following your access surgery. If so, this will be arranged at the time of your surgery. ° °Please call us immediately for any of the following conditions: ° °Increased pain, redness, drainage (pus) from your incision site °Fever of 101 degrees or higher °Severe or worsening pain at your incision site °Hand pain or numbness. ° °Reduce your risk of vascular disease: ° °Stop smoking. If you would like help, call [facility] at 1-800-QUIT-NOW (1-800-784-8669) or [place] at 336-586-4000 ° °Manage your cholesterol °Maintain a desired weight °Control your diabetes °Keep your blood pressure down ° °Dialysis ° °It will take several weeks to several months for your new dialysis access to be ready for use. Your surgeon will determine when it is OK to use it. Your nephrologist will continue to direct your dialysis. You can continue to use your Permcath until your new access is ready for use. ° °If you have any questions, please call the office at [phone]. ° °

## 2019-03-04 ENCOUNTER — Encounter (HOSPITAL_COMMUNITY): Payer: Self-pay | Admitting: Vascular Surgery

## 2019-03-19 ENCOUNTER — Encounter (HOSPITAL_COMMUNITY): Payer: Self-pay | Admitting: Vascular Surgery

## 2019-04-12 ENCOUNTER — Emergency Department (HOSPITAL_COMMUNITY): Payer: No Typology Code available for payment source

## 2019-04-12 ENCOUNTER — Other Ambulatory Visit: Payer: Self-pay

## 2019-04-12 ENCOUNTER — Emergency Department (HOSPITAL_COMMUNITY)
Admission: EM | Admit: 2019-04-12 | Discharge: 2019-04-12 | Disposition: A | Discharge status: Home / Self Care | Payer: No Typology Code available for payment source | Source: Home / Self Care | Attending: Emergency Medicine | Admitting: Emergency Medicine

## 2019-04-12 DIAGNOSIS — Z7982 Long term (current) use of aspirin: Secondary | ICD-10-CM | POA: Insufficient documentation

## 2019-04-12 DIAGNOSIS — Z992 Dependence on renal dialysis: Secondary | ICD-10-CM | POA: Insufficient documentation

## 2019-04-12 DIAGNOSIS — R1114 Bilious vomiting: Secondary | ICD-10-CM

## 2019-04-12 DIAGNOSIS — R1084 Generalized abdominal pain: Secondary | ICD-10-CM | POA: Diagnosis not present

## 2019-04-12 DIAGNOSIS — I120 Hypertensive chronic kidney disease with stage 5 chronic kidney disease or end stage renal disease: Secondary | ICD-10-CM | POA: Insufficient documentation

## 2019-04-12 DIAGNOSIS — Z79899 Other long term (current) drug therapy: Secondary | ICD-10-CM | POA: Insufficient documentation

## 2019-04-12 DIAGNOSIS — R1032 Left lower quadrant pain: Secondary | ICD-10-CM

## 2019-04-12 DIAGNOSIS — F17210 Nicotine dependence, cigarettes, uncomplicated: Secondary | ICD-10-CM | POA: Insufficient documentation

## 2019-04-12 DIAGNOSIS — N186 End stage renal disease: Secondary | ICD-10-CM | POA: Insufficient documentation

## 2019-04-12 DIAGNOSIS — T827XXA Infection and inflammatory reaction due to other cardiac and vascular devices, implants and grafts, initial encounter: Secondary | ICD-10-CM | POA: Diagnosis not present

## 2019-04-12 DIAGNOSIS — Z20828 Contact with and (suspected) exposure to other viral communicable diseases: Secondary | ICD-10-CM | POA: Insufficient documentation

## 2019-04-12 LAB — COMPREHENSIVE METABOLIC PANEL
ALT: 8 U/L (ref 0–44)
AST: 19 U/L (ref 15–41)
Albumin: 3.9 g/dL (ref 3.5–5.0)
Alkaline Phosphatase: 70 U/L (ref 38–126)
Anion gap: 14 (ref 5–15)
BUN: 25 mg/dL — ABNORMAL HIGH (ref 6–20)
CO2: 28 mmol/L (ref 22–32)
Calcium: 9.4 mg/dL (ref 8.9–10.3)
Chloride: 95 mmol/L — ABNORMAL LOW (ref 98–111)
Creatinine, Ser: 7.68 mg/dL — ABNORMAL HIGH (ref 0.61–1.24)
GFR calc Af Amer: 8 mL/min — ABNORMAL LOW (ref >60)
GFR calc non Af Amer: 7 mL/min — ABNORMAL LOW (ref >60)
Glucose, Bld: 102 mg/dL — ABNORMAL HIGH (ref 70–99)
Potassium: 4.2 mmol/L (ref 3.5–5.1)
Sodium: 137 mmol/L (ref 135–145)
Total Bilirubin: 0.7 mg/dL (ref 0.3–1.2)
Total Protein: 7.9 g/dL (ref 6.5–8.1)

## 2019-04-12 LAB — CBC WITH DIFFERENTIAL/PLATELET
Abs Immature Granulocytes: 0.04 10*3/uL (ref 0.00–0.07)
Basophils Absolute: 0 10*3/uL (ref 0.0–0.1)
Basophils Relative: 0 %
Eosinophils Absolute: 0 10*3/uL (ref 0.0–0.5)
Eosinophils Relative: 0 %
HCT: 37.8 % — ABNORMAL LOW (ref 39.0–52.0)
Hemoglobin: 12.7 g/dL — ABNORMAL LOW (ref 13.0–17.0)
Immature Granulocytes: 1 %
Lymphocytes Relative: 5 %
Lymphs Abs: 0.4 10*3/uL — ABNORMAL LOW (ref 0.7–4.0)
MCH: 30.8 pg (ref 26.0–34.0)
MCHC: 33.6 g/dL (ref 30.0–36.0)
MCV: 91.7 fL (ref 80.0–100.0)
Monocytes Absolute: 1.2 10*3/uL — ABNORMAL HIGH (ref 0.1–1.0)
Monocytes Relative: 14 %
Neutro Abs: 6.5 10*3/uL (ref 1.7–7.7)
Neutrophils Relative %: 80 %
Platelets: 142 10*3/uL — ABNORMAL LOW (ref 150–400)
RBC: 4.12 MIL/uL — ABNORMAL LOW (ref 4.22–5.81)
RDW: 17 % — ABNORMAL HIGH (ref 11.5–15.5)
WBC: 8.2 10*3/uL (ref 4.0–10.5)
nRBC: 0 % (ref 0.0–0.2)

## 2019-04-12 LAB — SARS CORONAVIRUS 2 BY RT PCR (HOSPITAL ORDER, PERFORMED IN ~~LOC~~ HOSPITAL LAB): SARS Coronavirus 2: NEGATIVE

## 2019-04-12 LAB — LIPASE, BLOOD: Lipase: 31 U/L (ref 11–51)

## 2019-04-12 MED ORDER — HYDROCODONE-ACETAMINOPHEN 5-325 MG PO TABS: 1.0000 | ORAL_TABLET | Freq: Four times a day (QID) | ORAL | 0 refills | Status: DC | PRN

## 2019-04-12 MED ORDER — ONDANSETRON HCL 4 MG/2ML IJ SOLN
4.0000 mg | Freq: Once | INTRAMUSCULAR | Status: AC
  Administered 2019-04-12: 4 mg via INTRAVENOUS
  Filled 2019-04-12: qty 2

## 2019-04-12 MED ORDER — IOHEXOL 300 MG/ML  SOLN
100.0000 mL | Freq: Once | INTRAMUSCULAR | Status: AC | PRN
  Administered 2019-04-12: 100 mL via INTRAVENOUS

## 2019-04-12 MED ORDER — FENTANYL CITRATE (PF) 100 MCG/2ML IJ SOLN
50.0000 ug | Freq: Once | INTRAMUSCULAR | Status: AC
  Administered 2019-04-12: 50 ug via INTRAVENOUS
  Filled 2019-04-12: qty 2

## 2019-04-12 MED ORDER — ONDANSETRON 4 MG PO TBDP: 4.0000 mg | ORAL_TABLET | Freq: Three times a day (TID) | ORAL | 0 refills | Status: DC | PRN

## 2019-04-12 MED ORDER — SODIUM CHLORIDE 0.9 % IV SOLN
INTRAVENOUS | Status: DC
  Administered 2019-04-12: 12:00:00 via INTRAVENOUS

## 2019-04-12 NOTE — ED Notes (Signed)
Pt. States that they do not make urine.

## 2019-04-12 NOTE — ED Triage Notes (Signed)
Pt here from dialysis-vomited dark emesis before dialysis x 3. LLQ tenderness. At dialysis got cold chills and began feeling overall unwell so EMS was called. Completed 3/4.25 hours of dialysis. 650 tylenol given 0600 today.

## 2019-04-12 NOTE — Discharge Instructions (Signed)
As discussed, your evaluation today has been largely reassuring.  But, it is important that you monitor your condition carefully, and do not hesitate to return to the ED if you develop new, or concerning changes in your condition. ? ?Otherwise, please follow-up with your physician for appropriate ongoing care. ? ?
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
## 2019-04-12 NOTE — ED Notes (Signed)
Pt mom Arlander Gillen [phone]

## 2019-04-14 ENCOUNTER — Inpatient Hospital Stay (HOSPITAL_COMMUNITY)
Admission: EM | Admit: 2019-04-14 | Discharge: 2019-04-18 | DRG: 252 | Disposition: A | Discharge status: Home / Self Care | Payer: No Typology Code available for payment source | Source: Ambulatory Visit | Attending: Internal Medicine | Admitting: Internal Medicine

## 2019-04-14 ENCOUNTER — Emergency Department (HOSPITAL_COMMUNITY): Payer: No Typology Code available for payment source

## 2019-04-14 ENCOUNTER — Encounter (HOSPITAL_COMMUNITY): Payer: Self-pay

## 2019-04-14 ENCOUNTER — Other Ambulatory Visit: Payer: Self-pay

## 2019-04-14 DIAGNOSIS — I440 Atrioventricular block, first degree: Secondary | ICD-10-CM | POA: Diagnosis present

## 2019-04-14 DIAGNOSIS — E1122 Type 2 diabetes mellitus with diabetic chronic kidney disease: Secondary | ICD-10-CM | POA: Diagnosis present

## 2019-04-14 DIAGNOSIS — Z888 Allergy status to other drugs, medicaments and biological substances status: Secondary | ICD-10-CM

## 2019-04-14 DIAGNOSIS — B9561 Methicillin susceptible Staphylococcus aureus infection as the cause of diseases classified elsewhere: Secondary | ICD-10-CM | POA: Diagnosis not present

## 2019-04-14 DIAGNOSIS — B1920 Unspecified viral hepatitis C without hepatic coma: Secondary | ICD-10-CM | POA: Diagnosis present

## 2019-04-14 DIAGNOSIS — Z8673 Personal history of transient ischemic attack (TIA), and cerebral infarction without residual deficits: Secondary | ICD-10-CM | POA: Diagnosis not present

## 2019-04-14 DIAGNOSIS — Z7982 Long term (current) use of aspirin: Secondary | ICD-10-CM | POA: Diagnosis not present

## 2019-04-14 DIAGNOSIS — D696 Thrombocytopenia, unspecified: Secondary | ICD-10-CM | POA: Diagnosis present

## 2019-04-14 DIAGNOSIS — Z79899 Other long term (current) drug therapy: Secondary | ICD-10-CM

## 2019-04-14 DIAGNOSIS — R509 Fever, unspecified: Secondary | ICD-10-CM | POA: Diagnosis not present

## 2019-04-14 DIAGNOSIS — Z8249 Family history of ischemic heart disease and other diseases of the circulatory system: Secondary | ICD-10-CM | POA: Diagnosis not present

## 2019-04-14 DIAGNOSIS — Y832 Surgical operation with anastomosis, bypass or graft as the cause of abnormal reaction of the patient, or of later complication, without mention of misadventure at the time of the procedure: Secondary | ICD-10-CM | POA: Diagnosis present

## 2019-04-14 DIAGNOSIS — H409 Unspecified glaucoma: Secondary | ICD-10-CM | POA: Diagnosis not present

## 2019-04-14 DIAGNOSIS — Z992 Dependence on renal dialysis: Secondary | ICD-10-CM

## 2019-04-14 DIAGNOSIS — F17200 Nicotine dependence, unspecified, uncomplicated: Secondary | ICD-10-CM | POA: Diagnosis present

## 2019-04-14 DIAGNOSIS — N186 End stage renal disease: Secondary | ICD-10-CM | POA: Diagnosis present

## 2019-04-14 DIAGNOSIS — D631 Anemia in chronic kidney disease: Secondary | ICD-10-CM | POA: Diagnosis present

## 2019-04-14 DIAGNOSIS — J189 Pneumonia, unspecified organism: Secondary | ICD-10-CM | POA: Diagnosis present

## 2019-04-14 DIAGNOSIS — Z79891 Long term (current) use of opiate analgesic: Secondary | ICD-10-CM

## 2019-04-14 DIAGNOSIS — R1084 Generalized abdominal pain: Secondary | ICD-10-CM | POA: Diagnosis present

## 2019-04-14 DIAGNOSIS — Y95 Nosocomial condition: Secondary | ICD-10-CM | POA: Diagnosis present

## 2019-04-14 DIAGNOSIS — R109 Unspecified abdominal pain: Secondary | ICD-10-CM | POA: Diagnosis not present

## 2019-04-14 DIAGNOSIS — T827XXA Infection and inflammatory reaction due to other cardiac and vascular devices, implants and grafts, initial encounter: Principal | ICD-10-CM | POA: Diagnosis present

## 2019-04-14 DIAGNOSIS — T82838A Hemorrhage of vascular prosthetic devices, implants and grafts, initial encounter: Secondary | ICD-10-CM | POA: Diagnosis present

## 2019-04-14 DIAGNOSIS — R066 Hiccough: Secondary | ICD-10-CM | POA: Diagnosis present

## 2019-04-14 DIAGNOSIS — F17210 Nicotine dependence, cigarettes, uncomplicated: Secondary | ICD-10-CM | POA: Diagnosis not present

## 2019-04-14 DIAGNOSIS — T82898A Other specified complication of vascular prosthetic devices, implants and grafts, initial encounter: Secondary | ICD-10-CM | POA: Diagnosis not present

## 2019-04-14 DIAGNOSIS — A4101 Sepsis due to Methicillin susceptible Staphylococcus aureus: Secondary | ICD-10-CM | POA: Diagnosis present

## 2019-04-14 DIAGNOSIS — Z8619 Personal history of other infectious and parasitic diseases: Secondary | ICD-10-CM | POA: Diagnosis not present

## 2019-04-14 DIAGNOSIS — E875 Hyperkalemia: Secondary | ICD-10-CM | POA: Diagnosis present

## 2019-04-14 DIAGNOSIS — I120 Hypertensive chronic kidney disease with stage 5 chronic kidney disease or end stage renal disease: Secondary | ICD-10-CM | POA: Diagnosis present

## 2019-04-14 DIAGNOSIS — R7881 Bacteremia: Secondary | ICD-10-CM | POA: Diagnosis not present

## 2019-04-14 DIAGNOSIS — R7989 Other specified abnormal findings of blood chemistry: Secondary | ICD-10-CM | POA: Diagnosis present

## 2019-04-14 DIAGNOSIS — R918 Other nonspecific abnormal finding of lung field: Secondary | ICD-10-CM | POA: Diagnosis not present

## 2019-04-14 DIAGNOSIS — Z8701 Personal history of pneumonia (recurrent): Secondary | ICD-10-CM

## 2019-04-14 DIAGNOSIS — F329 Major depressive disorder, single episode, unspecified: Secondary | ICD-10-CM | POA: Diagnosis present

## 2019-04-14 DIAGNOSIS — E871 Hypo-osmolality and hyponatremia: Secondary | ICD-10-CM | POA: Diagnosis present

## 2019-04-14 DIAGNOSIS — I700 Atherosclerosis of aorta: Secondary | ICD-10-CM | POA: Diagnosis not present

## 2019-04-14 DIAGNOSIS — Z20828 Contact with and (suspected) exposure to other viral communicable diseases: Secondary | ICD-10-CM | POA: Diagnosis present

## 2019-04-14 LAB — CBC WITH DIFFERENTIAL/PLATELET
Abs Immature Granulocytes: 0.13 10*3/uL — ABNORMAL HIGH (ref 0.00–0.07)
Basophils Absolute: 0 10*3/uL (ref 0.0–0.1)
Basophils Relative: 0 %
Eosinophils Absolute: 0 10*3/uL (ref 0.0–0.5)
Eosinophils Relative: 0 %
HCT: 35.6 % — ABNORMAL LOW (ref 39.0–52.0)
Hemoglobin: 12.2 g/dL — ABNORMAL LOW (ref 13.0–17.0)
Immature Granulocytes: 2 %
Lymphocytes Relative: 1 %
Lymphs Abs: 0.1 10*3/uL — ABNORMAL LOW (ref 0.7–4.0)
MCH: 31.1 pg (ref 26.0–34.0)
MCHC: 34.3 g/dL (ref 30.0–36.0)
MCV: 90.8 fL (ref 80.0–100.0)
Monocytes Absolute: 0.7 10*3/uL (ref 0.1–1.0)
Monocytes Relative: 9 %
Neutro Abs: 7.3 10*3/uL (ref 1.7–7.7)
Neutrophils Relative %: 88 %
Platelets: 92 10*3/uL — ABNORMAL LOW (ref 150–400)
RBC: 3.92 MIL/uL — ABNORMAL LOW (ref 4.22–5.81)
RDW: 16.9 % — ABNORMAL HIGH (ref 11.5–15.5)
WBC: 8.3 10*3/uL (ref 4.0–10.5)
nRBC: 0 % (ref 0.0–0.2)

## 2019-04-14 LAB — COMPREHENSIVE METABOLIC PANEL
ALT: 13 U/L (ref 0–44)
AST: 27 U/L (ref 15–41)
Albumin: 3.4 g/dL — ABNORMAL LOW (ref 3.5–5.0)
Alkaline Phosphatase: 75 U/L (ref 38–126)
Anion gap: 24 — ABNORMAL HIGH (ref 5–15)
BUN: 80 mg/dL — ABNORMAL HIGH (ref 6–20)
CO2: 21 mmol/L — ABNORMAL LOW (ref 22–32)
Calcium: 9.3 mg/dL (ref 8.9–10.3)
Chloride: 86 mmol/L — ABNORMAL LOW (ref 98–111)
Creatinine, Ser: 15.82 mg/dL — ABNORMAL HIGH (ref 0.61–1.24)
GFR calc Af Amer: 3 mL/min — ABNORMAL LOW (ref >60)
GFR calc non Af Amer: 3 mL/min — ABNORMAL LOW (ref >60)
Glucose, Bld: 113 mg/dL — ABNORMAL HIGH (ref 70–99)
Potassium: 6.3 mmol/L (ref 3.5–5.1)
Sodium: 131 mmol/L — ABNORMAL LOW (ref 135–145)
Total Bilirubin: 0.6 mg/dL (ref 0.3–1.2)
Total Protein: 7.3 g/dL (ref 6.5–8.1)

## 2019-04-14 LAB — SARS CORONAVIRUS 2 BY RT PCR (HOSPITAL ORDER, PERFORMED IN ~~LOC~~ HOSPITAL LAB): SARS Coronavirus 2: NEGATIVE

## 2019-04-14 LAB — LACTIC ACID, PLASMA: Lactic Acid, Venous: 1.4 mmol/L (ref 0.5–1.9)

## 2019-04-14 LAB — PROTIME-INR
INR: 1.2 (ref 0.8–1.2)
Prothrombin Time: 14.7 seconds (ref 11.4–15.2)

## 2019-04-14 LAB — GLUCOSE, RANDOM: Glucose, Bld: 139 mg/dL — ABNORMAL HIGH (ref 70–99)

## 2019-04-14 LAB — CBG MONITORING, ED
Glucose-Capillary: 103 mg/dL — ABNORMAL HIGH (ref 70–99)
Glucose-Capillary: 169 mg/dL — ABNORMAL HIGH (ref 70–99)

## 2019-04-14 LAB — TROPONIN I: Troponin I: 0.1 ng/mL (ref <0.03)

## 2019-04-14 LAB — GLUCOSE, CAPILLARY: Glucose-Capillary: 147 mg/dL — ABNORMAL HIGH (ref 70–99)

## 2019-04-14 MED ORDER — ACETAMINOPHEN 500 MG PO TABS
1000.0000 mg | ORAL_TABLET | Freq: Once | ORAL | Status: AC
  Administered 2019-04-14: 15:00:00 1000 mg via ORAL
  Filled 2019-04-14: qty 2

## 2019-04-14 MED ORDER — DEXTROSE 50 % IV SOLN
1.0000 | Freq: Once | INTRAVENOUS | Status: AC
  Administered 2019-04-14: 18:00:00 50 mL via INTRAVENOUS
  Filled 2019-04-14: qty 50

## 2019-04-14 MED ORDER — ALBUTEROL SULFATE (2.5 MG/3ML) 0.083% IN NEBU
2.5000 mg | INHALATION_SOLUTION | Freq: Four times a day (QID) | RESPIRATORY_TRACT | Status: DC
  Administered 2019-04-14 – 2019-04-15 (×2): 2.5 mg via RESPIRATORY_TRACT
  Filled 2019-04-14: qty 3

## 2019-04-14 MED ORDER — LABETALOL HCL 300 MG PO TABS
300.0000 mg | ORAL_TABLET | Freq: Three times a day (TID) | ORAL | Status: DC
  Administered 2019-04-14 – 2019-04-18 (×10): 300 mg via ORAL
  Filled 2019-04-14 (×10): qty 1

## 2019-04-14 MED ORDER — LIDOCAINE HCL (PF) 1 % IJ SOLN: 5.0000 mL | INTRAMUSCULAR | Status: DC | PRN

## 2019-04-14 MED ORDER — ALBUTEROL SULFATE (2.5 MG/3ML) 0.083% IN NEBU: 2.5000 mg | INHALATION_SOLUTION | RESPIRATORY_TRACT | Status: DC | PRN

## 2019-04-14 MED ORDER — ONDANSETRON HCL 4 MG PO TABS
4.0000 mg | ORAL_TABLET | Freq: Four times a day (QID) | ORAL | Status: DC | PRN
  Administered 2019-04-18 (×2): 4 mg via ORAL
  Filled 2019-04-14 (×2): qty 1

## 2019-04-14 MED ORDER — INSULIN ASPART 100 UNIT/ML IV SOLN
5.0000 [IU] | Freq: Once | INTRAVENOUS | Status: AC
  Administered 2019-04-14: 18:00:00 5 [IU] via INTRAVENOUS

## 2019-04-14 MED ORDER — DEXTROSE 10 % IV SOLN
Freq: Once | INTRAVENOUS | Status: AC
  Administered 2019-04-14: 18:00:00 250 mL/h via INTRAVENOUS

## 2019-04-14 MED ORDER — VANCOMYCIN HCL 10 G IV SOLR
2000.0000 mg | Freq: Once | INTRAVENOUS | Status: AC
  Administered 2019-04-14: 16:00:00 2000 mg via INTRAVENOUS
  Filled 2019-04-14: qty 2000

## 2019-04-14 MED ORDER — AMLODIPINE BESYLATE 10 MG PO TABS
10.0000 mg | ORAL_TABLET | Freq: Every day | ORAL | Status: DC
  Administered 2019-04-14 – 2019-04-17 (×3): 10 mg via ORAL
  Filled 2019-04-14 (×3): qty 1

## 2019-04-14 MED ORDER — HYDRALAZINE HCL 50 MG PO TABS
100.0000 mg | ORAL_TABLET | Freq: Three times a day (TID) | ORAL | Status: DC
  Administered 2019-04-14 – 2019-04-18 (×10): 100 mg via ORAL
  Filled 2019-04-14 (×11): qty 2

## 2019-04-14 MED ORDER — HYDRALAZINE HCL 20 MG/ML IJ SOLN
10.0000 mg | Freq: Once | INTRAMUSCULAR | Status: AC
  Administered 2019-04-14: 10 mg via INTRAVENOUS
  Filled 2019-04-14: qty 1

## 2019-04-14 MED ORDER — ACETAMINOPHEN 325 MG PO TABS
650.0000 mg | ORAL_TABLET | ORAL | Status: DC | PRN
  Administered 2019-04-14 – 2019-04-18 (×4): 650 mg via ORAL
  Filled 2019-04-14 (×5): qty 2

## 2019-04-14 MED ORDER — VANCOMYCIN VARIABLE DOSE PER UNSTABLE RENAL FUNCTION (PHARMACIST DOSING): Status: DC

## 2019-04-14 MED ORDER — FERRIC CITRATE 1 GM 210 MG(FE) PO TABS: 420.0000 mg | ORAL_TABLET | ORAL | Status: DC

## 2019-04-14 MED ORDER — SODIUM CHLORIDE 0.9 % IV SOLN: 1.0000 g | INTRAVENOUS | Status: DC

## 2019-04-14 MED ORDER — FERRIC CITRATE 1 GM 210 MG(FE) PO TABS
630.0000 mg | ORAL_TABLET | Freq: Three times a day (TID) | ORAL | Status: DC
  Administered 2019-04-15 – 2019-04-18 (×8): 630 mg via ORAL
  Filled 2019-04-14 (×12): qty 3

## 2019-04-14 MED ORDER — HEPARIN SODIUM (PORCINE) 1000 UNIT/ML DIALYSIS
1000.0000 [IU] | INTRAMUSCULAR | Status: DC | PRN
  Filled 2019-04-14: qty 1

## 2019-04-14 MED ORDER — LIDOCAINE-PRILOCAINE 2.5-2.5 % EX CREA
1.0000 "application " | TOPICAL_CREAM | CUTANEOUS | Status: DC | PRN
  Filled 2019-04-14: qty 5

## 2019-04-14 MED ORDER — SODIUM CHLORIDE 0.9 % IV SOLN
250.0000 mL | INTRAVENOUS | Status: DC | PRN
  Administered 2019-04-15: 19:00:00 via INTRAVENOUS

## 2019-04-14 MED ORDER — HEPARIN SODIUM (PORCINE) 1000 UNIT/ML DIALYSIS
20.0000 [IU]/kg | INTRAMUSCULAR | Status: DC | PRN
  Filled 2019-04-14: qty 2

## 2019-04-14 MED ORDER — FERRIC CITRATE 1 GM 210 MG(FE) PO TABS
420.0000 mg | ORAL_TABLET | Freq: Three times a day (TID) | ORAL | Status: DC | PRN
  Filled 2019-04-14: qty 2

## 2019-04-14 MED ORDER — PENTAFLUOROPROP-TETRAFLUOROETH EX AERO: 1.0000 "application " | INHALATION_SPRAY | CUTANEOUS | Status: DC | PRN

## 2019-04-14 MED ORDER — ASPIRIN EC 81 MG PO TBEC
81.0000 mg | DELAYED_RELEASE_TABLET | Freq: Every day | ORAL | Status: DC
  Administered 2019-04-15 – 2019-04-18 (×4): 81 mg via ORAL
  Filled 2019-04-14 (×4): qty 1

## 2019-04-14 MED ORDER — ENOXAPARIN SODIUM 40 MG/0.4ML ~~LOC~~ SOLN: 40.0000 mg | SUBCUTANEOUS | Status: DC

## 2019-04-14 MED ORDER — SODIUM CHLORIDE 0.9% FLUSH: 3.0000 mL | INTRAVENOUS | Status: DC | PRN

## 2019-04-14 MED ORDER — SODIUM CHLORIDE 0.9 % IV SOLN: 100.0000 mL | INTRAVENOUS | Status: DC | PRN

## 2019-04-14 MED ORDER — ALTEPLASE 2 MG IJ SOLR: 2.0000 mg | Freq: Once | INTRAMUSCULAR | Status: DC | PRN

## 2019-04-14 MED ORDER — SODIUM CHLORIDE 0.9% FLUSH
3.0000 mL | Freq: Two times a day (BID) | INTRAVENOUS | Status: DC
  Administered 2019-04-14 – 2019-04-18 (×6): 3 mL via INTRAVENOUS

## 2019-04-14 MED ORDER — PATIROMER SORBITEX CALCIUM 8.4 G PO PACK
16.8000 g | PACK | Freq: Once | ORAL | Status: AC
  Administered 2019-04-14: 16.8 g via ORAL
  Filled 2019-04-14: qty 2

## 2019-04-14 MED ORDER — SODIUM CHLORIDE 0.9 % IV SOLN
1.0000 g | Freq: Once | INTRAVENOUS | Status: AC
  Administered 2019-04-14: 1 g via INTRAVENOUS
  Filled 2019-04-14: qty 1

## 2019-04-14 MED ORDER — HYDROCODONE-ACETAMINOPHEN 5-325 MG PO TABS
1.0000 | ORAL_TABLET | Freq: Four times a day (QID) | ORAL | Status: DC | PRN
  Administered 2019-04-15 – 2019-04-18 (×3): 1 via ORAL
  Filled 2019-04-14 (×4): qty 1

## 2019-04-14 MED ORDER — SEVELAMER CARBONATE 800 MG PO TABS
800.0000 mg | ORAL_TABLET | Freq: Three times a day (TID) | ORAL | Status: DC
  Administered 2019-04-15 – 2019-04-18 (×8): 800 mg via ORAL
  Filled 2019-04-14 (×8): qty 1

## 2019-04-14 MED ORDER — ONDANSETRON 4 MG PO TBDP: 4.0000 mg | ORAL_TABLET | Freq: Three times a day (TID) | ORAL | Status: DC | PRN

## 2019-04-14 MED ORDER — HEPARIN SODIUM (PORCINE) 5000 UNIT/ML IJ SOLN
5000.0000 [IU] | Freq: Two times a day (BID) | INTRAMUSCULAR | Status: DC
  Administered 2019-04-14: 5000 [IU] via SUBCUTANEOUS
  Filled 2019-04-14 (×2): qty 1

## 2019-04-14 MED ORDER — ONDANSETRON HCL 4 MG/2ML IJ SOLN
4.0000 mg | Freq: Four times a day (QID) | INTRAMUSCULAR | Status: DC | PRN
  Administered 2019-04-15: 4 mg via INTRAVENOUS
  Filled 2019-04-14: qty 2
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
## 2019-04-14 NOTE — ED Notes (Signed)
MD at bedside. 

## 2019-04-14 NOTE — ED Notes (Signed)
Iv at left hand not funtional.

## 2019-04-14 NOTE — ED Notes (Signed)
Pt arrived with plastic wrap to left arm. Removed.

## 2019-04-14 NOTE — ED Notes (Addendum)
MD aware of abnormal labs.
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
## 2019-04-14 NOTE — ED Notes (Addendum)
Pt c/o/ hiccough resolve after a few minutes.

## 2019-04-14 NOTE — ED Notes (Signed)
Patient transported to CT 

## 2019-04-14 NOTE — ED Notes (Signed)
Pt is unable to provide urine specimen due to the fact he is a dialysis pt.
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
## 2019-04-14 NOTE — ED Triage Notes (Signed)
Pt arrives [facility] from dialysis center where he was going in to be treated but found to have fever 101.4. Pt also c/o abdominal pain and nausea x 2 days. Pt usually t/th/s. Schedule but off schedule due to recent hospitalization.

## 2019-04-14 NOTE — ED Provider Notes (Signed)
Care transferred to me.  Patient's labs are consistent with missing dialysis including a potassium of 6.3.  His CT scans show no acute intra-abdominal pathology but do show pneumonia/atypical pneumonia.  His pain is a little better.  He is tachypneic but in no respiratory distress.  He has been given pneumonia/hospital pneumonia antibiotics.  He is being given insulin and dextrose for the hyperkalemia.  There are some peaked T waves.  Covid is negative. D/w Dr. Laren Everts who will admit. D/w Nephrology, Dr. Joelyn Oms, who asks for 16.8 g veltassa as well.    EKG Interpretation  Date/Time:  Monday April 14 2019 13:55:30 EDT Ventricular Rate:  102 PR Interval:    QRS Duration: 90 QT Interval:  323 QTC Calculation: 421 R Axis:   -68 Text Interpretation:  Sinus or ectopic atrial tachycardia Prolonged PR interval Left anterior fascicular block Abnormal R-wave progression, early transition Borderline abnrm T, anterolateral leads Confirmed by Dene Gentry [id number]) on 04/14/2019 2:15:31 PM        CRITICAL CARE Performed by: Ephraim Hamburger   Total critical care time: 30 minutes  Critical care time was exclusive of separately billable procedures and treating other patients.  Critical care was necessary to treat or prevent imminent or life-threatening deterioration.  Critical care was time spent personally by me on the following activities: development of treatment plan with patient and/or surrogate as well as nursing, discussions with consultants, evaluation of patient's response to treatment, examination of patient, obtaining history from patient or surrogate, ordering and performing treatments and interventions, ordering and review of laboratory studies, ordering and review of radiographic studies, pulse oximetry and re-evaluation of patient's condition.    Sherwood Gambler, MD 04/14/19 [phone]

## 2019-04-14 NOTE — Progress Notes (Signed)
Pharmacy Antibiotic Note  Frank Fuller is a 58 y.o. male admitted on 04/14/2019 with sepsis.  Pharmacy has been consulted for Cefepime and Vancomycin dosing.  Patient is on hemodialysis.  Plan: Vancomycin 2 grams IV x 1, then variable dosing depending on dialysis schedule. Cefepime 1 gram IV q24hr Monitor C&S, dialysis schedule and vancomycin levels as needed  Height: 6' 3.5" (191.8 cm) Weight: 205 lb (93 kg) IBW/kg (Calculated) : 85.65  Temp (24hrs), Avg:102.5 F (39.2 C), Min:102.5 F (39.2 C), Max:102.5 F (39.2 C)  Recent Labs  Lab 04/12/19 1100  WBC 8.2  CREATININE 7.68*    Estimated Creatinine Clearance: 12.4 mL/min (A) (by C-G formula based on SCr of 7.68 mg/dL (H)).    Allergies  Allergen Reactions  . Chlorhexidine     UNSPECIFIED REACTION  Patch skin test done at dialysis 06/26/17  - staff using clear dressing and alcohol to clean exit site of catheter  . Clonidine Derivatives Other (See Comments)    unresponsive  . Lisinopril Other (See Comments)    unresponsive  . Carvedilol Rash    Antimicrobials this admission: Vanc 6/22 >>  Cefepime 6/22 >>   Thank you for allowing pharmacy to be a part of this patient's care.  Alanda Slim, PharmD, [facility] Clinical Pharmacist Please see AMION for all Pharmacists' Contact Phone Numbers 04/14/2019, 2:37 PM

## 2019-04-15 ENCOUNTER — Inpatient Hospital Stay (HOSPITAL_COMMUNITY): Payer: No Typology Code available for payment source | Admitting: Anesthesiology

## 2019-04-15 ENCOUNTER — Inpatient Hospital Stay (HOSPITAL_COMMUNITY): Payer: No Typology Code available for payment source

## 2019-04-15 ENCOUNTER — Inpatient Hospital Stay (HOSPITAL_COMMUNITY)
Admission: EM | Disposition: A | Discharge status: Home / Self Care | Payer: Self-pay | Source: Home / Self Care | Attending: Internal Medicine

## 2019-04-15 ENCOUNTER — Encounter (HOSPITAL_COMMUNITY): Payer: Self-pay

## 2019-04-15 DIAGNOSIS — T82838A Hemorrhage of vascular prosthetic devices, implants and grafts, initial encounter: Secondary | ICD-10-CM

## 2019-04-15 DIAGNOSIS — R7881 Bacteremia: Secondary | ICD-10-CM

## 2019-04-15 DIAGNOSIS — Z888 Allergy status to other drugs, medicaments and biological substances status: Secondary | ICD-10-CM

## 2019-04-15 DIAGNOSIS — I120 Hypertensive chronic kidney disease with stage 5 chronic kidney disease or end stage renal disease: Secondary | ICD-10-CM

## 2019-04-15 DIAGNOSIS — H409 Unspecified glaucoma: Secondary | ICD-10-CM

## 2019-04-15 DIAGNOSIS — R109 Unspecified abdominal pain: Secondary | ICD-10-CM

## 2019-04-15 DIAGNOSIS — R509 Fever, unspecified: Secondary | ICD-10-CM

## 2019-04-15 DIAGNOSIS — Z8673 Personal history of transient ischemic attack (TIA), and cerebral infarction without residual deficits: Secondary | ICD-10-CM

## 2019-04-15 DIAGNOSIS — N186 End stage renal disease: Secondary | ICD-10-CM

## 2019-04-15 DIAGNOSIS — B9561 Methicillin susceptible Staphylococcus aureus infection as the cause of diseases classified elsewhere: Secondary | ICD-10-CM

## 2019-04-15 DIAGNOSIS — Z8619 Personal history of other infectious and parasitic diseases: Secondary | ICD-10-CM

## 2019-04-15 DIAGNOSIS — Z992 Dependence on renal dialysis: Secondary | ICD-10-CM

## 2019-04-15 DIAGNOSIS — T82898A Other specified complication of vascular prosthetic devices, implants and grafts, initial encounter: Secondary | ICD-10-CM

## 2019-04-15 DIAGNOSIS — R918 Other nonspecific abnormal finding of lung field: Secondary | ICD-10-CM

## 2019-04-15 DIAGNOSIS — R110 Nausea: Secondary | ICD-10-CM

## 2019-04-15 DIAGNOSIS — F17210 Nicotine dependence, cigarettes, uncomplicated: Secondary | ICD-10-CM

## 2019-04-15 DIAGNOSIS — Z91048 Other nonmedicinal substance allergy status: Secondary | ICD-10-CM

## 2019-04-15 HISTORY — PX: REVISION OF ARTERIOVENOUS GORETEX GRAFT: SHX6073

## 2019-04-15 LAB — BLOOD CULTURE ID PANEL (REFLEXED)

## 2019-04-15 LAB — RENAL FUNCTION PANEL
Albumin: 2.8 g/dL — ABNORMAL LOW (ref 3.5–5.0)
Anion gap: 17 — ABNORMAL HIGH (ref 5–15)
BUN: 47 mg/dL — ABNORMAL HIGH (ref 6–20)
CO2: 24 mmol/L (ref 22–32)
Calcium: 9 mg/dL (ref 8.9–10.3)
Chloride: 91 mmol/L — ABNORMAL LOW (ref 98–111)
Creatinine, Ser: 11.7 mg/dL — ABNORMAL HIGH (ref 0.61–1.24)
GFR calc Af Amer: 5 mL/min — ABNORMAL LOW (ref >60)
GFR calc non Af Amer: 4 mL/min — ABNORMAL LOW (ref >60)
Glucose, Bld: 111 mg/dL — ABNORMAL HIGH (ref 70–99)
Phosphorus: 5.8 mg/dL — ABNORMAL HIGH (ref 2.5–4.6)
Potassium: 4.7 mmol/L (ref 3.5–5.1)
Sodium: 132 mmol/L — ABNORMAL LOW (ref 135–145)

## 2019-04-15 LAB — CBC
HCT: 32.3 % — ABNORMAL LOW (ref 39.0–52.0)
Hemoglobin: 11.1 g/dL — ABNORMAL LOW (ref 13.0–17.0)
MCH: 31 pg (ref 26.0–34.0)
MCHC: 34.4 g/dL (ref 30.0–36.0)
MCV: 90.2 fL (ref 80.0–100.0)
Platelets: 90 10*3/uL — ABNORMAL LOW (ref 150–400)
RBC: 3.58 MIL/uL — ABNORMAL LOW (ref 4.22–5.81)
RDW: 16.8 % — ABNORMAL HIGH (ref 11.5–15.5)
WBC: 8.2 10*3/uL (ref 4.0–10.5)
nRBC: 0 % (ref 0.0–0.2)

## 2019-04-15 LAB — GLUCOSE, CAPILLARY: Glucose-Capillary: 107 mg/dL — ABNORMAL HIGH (ref 70–99)

## 2019-04-15 LAB — ECHOCARDIOGRAM COMPLETE
Height: 75.5 in
Weight: 3086.44 oz

## 2019-04-15 SURGERY — REVISION OF ARTERIOVENOUS GORETEX GRAFT
Anesthesia: General | Site: Arm Lower | Laterality: Right

## 2019-04-15 MED ORDER — CEFAZOLIN SODIUM-DEXTROSE 1-4 GM/50ML-% IV SOLN
1.0000 g | INTRAVENOUS | Status: DC
  Administered 2019-04-16 – 2019-04-17 (×2): 1 g via INTRAVENOUS
  Filled 2019-04-15 (×3): qty 50

## 2019-04-15 MED ORDER — HEPARIN SODIUM (PORCINE) 1000 UNIT/ML IJ SOLN
INTRAMUSCULAR | Status: DC | PRN
  Administered 2019-04-15: 7000 [IU] via INTRAVENOUS

## 2019-04-15 MED ORDER — LIDOCAINE HCL (CARDIAC) PF 100 MG/5ML IV SOSY
PREFILLED_SYRINGE | INTRAVENOUS | Status: DC | PRN
  Administered 2019-04-15: 40 mg via INTRATRACHEAL

## 2019-04-15 MED ORDER — VANCOMYCIN HCL 1000 MG IV SOLR
INTRAVENOUS | Status: DC | PRN
  Administered 2019-04-15: 1000 mg via INTRAVENOUS

## 2019-04-15 MED ORDER — PHENYLEPHRINE HCL (PRESSORS) 10 MG/ML IV SOLN
INTRAVENOUS | Status: AC
  Filled 2019-04-15: qty 1

## 2019-04-15 MED ORDER — MIDAZOLAM HCL 2 MG/2ML IJ SOLN
INTRAMUSCULAR | Status: AC
  Filled 2019-04-15: qty 2

## 2019-04-15 MED ORDER — ROCURONIUM 10MG/ML (10ML) SYRINGE FOR MEDFUSION PUMP - OPTIME
INTRAVENOUS | Status: DC | PRN
  Administered 2019-04-15: 30 mg via INTRAVENOUS

## 2019-04-15 MED ORDER — PROTAMINE SULFATE 10 MG/ML IV SOLN
INTRAVENOUS | Status: AC
  Filled 2019-04-15: qty 10

## 2019-04-15 MED ORDER — SODIUM CHLORIDE 0.9 % IV SOLN
INTRAVENOUS | Status: AC
  Filled 2019-04-15: qty 1.2

## 2019-04-15 MED ORDER — VANCOMYCIN HCL IN DEXTROSE 1-5 GM/200ML-% IV SOLN
INTRAVENOUS | Status: AC
  Filled 2019-04-15: qty 200

## 2019-04-15 MED ORDER — LIDOCAINE 2% (20 MG/ML) 5 ML SYRINGE
INTRAMUSCULAR | Status: AC
  Filled 2019-04-15: qty 5

## 2019-04-15 MED ORDER — PROPOFOL 10 MG/ML IV BOLUS
INTRAVENOUS | Status: AC
  Filled 2019-04-15: qty 20

## 2019-04-15 MED ORDER — SODIUM CHLORIDE (PF) 0.9 % IJ SOLN
INTRAMUSCULAR | Status: AC
  Filled 2019-04-15: qty 10

## 2019-04-15 MED ORDER — THROMBIN (RECOMBINANT) 20000 UNITS EX SOLR
CUTANEOUS | Status: AC
  Filled 2019-04-15: qty 20000

## 2019-04-15 MED ORDER — CHLORPROMAZINE HCL 25 MG/ML IJ SOLN
25.0000 mg | Freq: Once | INTRAMUSCULAR | Status: AC
  Administered 2019-04-15: 25 mg via INTRAMUSCULAR
  Filled 2019-04-15: qty 1

## 2019-04-15 MED ORDER — SUGAMMADEX SODIUM 200 MG/2ML IV SOLN
INTRAVENOUS | Status: DC | PRN
  Administered 2019-04-15: 200 mg via INTRAVENOUS

## 2019-04-15 MED ORDER — SODIUM CHLORIDE 0.9 % IV SOLN
INTRAVENOUS | Status: DC
  Administered 2019-04-15: 19:00:00 via INTRAVENOUS

## 2019-04-15 MED ORDER — LIDOCAINE HCL (PF) 1 % IJ SOLN
INTRAMUSCULAR | Status: AC
  Filled 2019-04-15: qty 30

## 2019-04-15 MED ORDER — ONDANSETRON HCL 4 MG/2ML IJ SOLN
INTRAMUSCULAR | Status: DC | PRN
  Administered 2019-04-15: 4 mg via INTRAVENOUS

## 2019-04-15 MED ORDER — PROTAMINE SULFATE 10 MG/ML IV SOLN
INTRAVENOUS | Status: DC | PRN
  Administered 2019-04-15: 30 mg via INTRAVENOUS

## 2019-04-15 MED ORDER — CALCIUM CHLORIDE 10 % IV SOLN
INTRAVENOUS | Status: DC | PRN
  Administered 2019-04-15: 500 mg via INTRAVENOUS

## 2019-04-15 MED ORDER — FENTANYL CITRATE (PF) 250 MCG/5ML IJ SOLN
INTRAMUSCULAR | Status: DC | PRN
  Administered 2019-04-15 (×3): 50 ug via INTRAVENOUS

## 2019-04-15 MED ORDER — MIDAZOLAM HCL 2 MG/2ML IJ SOLN
INTRAMUSCULAR | Status: DC | PRN
  Administered 2019-04-15: 2 mg via INTRAVENOUS

## 2019-04-15 MED ORDER — ONDANSETRON HCL 4 MG/2ML IJ SOLN
INTRAMUSCULAR | Status: AC
  Filled 2019-04-15: qty 2

## 2019-04-15 MED ORDER — FENTANYL CITRATE (PF) 250 MCG/5ML IJ SOLN
INTRAMUSCULAR | Status: AC
  Filled 2019-04-15: qty 5

## 2019-04-15 MED ORDER — THROMBIN 20000 UNITS EX SOLR
CUTANEOUS | Status: DC | PRN
  Administered 2019-04-15: 21:00:00 20 mL via TOPICAL

## 2019-04-15 MED ORDER — LIDOCAINE-EPINEPHRINE (PF) 1 %-1:200000 IJ SOLN
INTRAMUSCULAR | Status: AC
  Filled 2019-04-15: qty 30

## 2019-04-15 MED ORDER — SUCCINYLCHOLINE CHLORIDE 200 MG/10ML IV SOSY
PREFILLED_SYRINGE | INTRAVENOUS | Status: AC
  Filled 2019-04-15: qty 10

## 2019-04-15 MED ORDER — HEPARIN SODIUM (PORCINE) 5000 UNIT/ML IJ SOLN
5000.0000 [IU] | Freq: Three times a day (TID) | INTRAMUSCULAR | Status: DC
  Administered 2019-04-15 – 2019-04-18 (×5): 5000 [IU] via SUBCUTANEOUS
  Filled 2019-04-15 (×7): qty 1

## 2019-04-15 MED ORDER — ALBUMIN HUMAN 5 % IV SOLN
INTRAVENOUS | Status: DC | PRN
  Administered 2019-04-15 (×2): via INTRAVENOUS

## 2019-04-15 MED ORDER — SODIUM CHLORIDE 0.9 % IV SOLN
INTRAVENOUS | Status: DC | PRN
  Administered 2019-04-15: 500 mL

## 2019-04-15 MED ORDER — EPHEDRINE 5 MG/ML INJ
INTRAVENOUS | Status: AC
  Filled 2019-04-15: qty 20

## 2019-04-15 MED ORDER — DEXAMETHASONE SODIUM PHOSPHATE 10 MG/ML IJ SOLN
INTRAMUSCULAR | Status: AC
  Filled 2019-04-15: qty 2

## 2019-04-15 MED ORDER — PROPOFOL 10 MG/ML IV BOLUS
INTRAVENOUS | Status: DC | PRN
  Administered 2019-04-15: 130 mg via INTRAVENOUS

## 2019-04-15 MED ORDER — SODIUM CHLORIDE 0.9 % IV SOLN
INTRAVENOUS | Status: DC | PRN
  Administered 2019-04-15: 100 ug/min via INTRAVENOUS

## 2019-04-15 MED ORDER — EPHEDRINE SULFATE 50 MG/ML IJ SOLN
INTRAMUSCULAR | Status: DC | PRN
  Administered 2019-04-15: 10 mg via INTRAVENOUS

## 2019-04-15 MED ORDER — ROCURONIUM BROMIDE 10 MG/ML (PF) SYRINGE
PREFILLED_SYRINGE | INTRAVENOUS | Status: AC
  Filled 2019-04-15: qty 10

## 2019-04-15 MED ORDER — HEPARIN SODIUM (PORCINE) 1000 UNIT/ML IJ SOLN
INTRAMUSCULAR | Status: AC
  Filled 2019-04-15: qty 2

## 2019-04-15 MED ORDER — PHENYLEPHRINE HCL (PRESSORS) 10 MG/ML IV SOLN
INTRAVENOUS | Status: DC | PRN
  Administered 2019-04-15 (×2): 80 ug via INTRAVENOUS
  Administered 2019-04-15: 160 ug via INTRAVENOUS

## 2019-04-15 MED ORDER — CEFAZOLIN SODIUM-DEXTROSE 2-4 GM/100ML-% IV SOLN
2.0000 g | INTRAVENOUS | Status: DC
  Administered 2019-04-15: 2 g via INTRAVENOUS
  Filled 2019-04-15: qty 100

## 2019-04-15 MED ORDER — DEXAMETHASONE SODIUM PHOSPHATE 10 MG/ML IJ SOLN
INTRAMUSCULAR | Status: DC | PRN
  Administered 2019-04-15: 10 mg via INTRAVENOUS

## 2019-04-15 MED ORDER — 0.9 % SODIUM CHLORIDE (POUR BTL) OPTIME
TOPICAL | Status: DC | PRN
  Administered 2019-04-15: 1000 mL

## 2019-04-15 MED ORDER — PHENYLEPHRINE 40 MCG/ML (10ML) SYRINGE FOR IV PUSH (FOR BLOOD PRESSURE SUPPORT)
PREFILLED_SYRINGE | INTRAVENOUS | Status: AC
  Filled 2019-04-15: qty 30

## 2019-04-15 MED ORDER — SUCCINYLCHOLINE 20MG/ML (10ML) SYRINGE FOR MEDFUSION PUMP - OPTIME
INTRAMUSCULAR | Status: DC | PRN
  Administered 2019-04-15: 110 mg via INTRAVENOUS

## 2019-04-15 SURGICAL SUPPLY — 45 items
CANISTER SUCT 3000ML PPV (MISCELLANEOUS) ×3 IMPLANT
CANNULA VESSEL 3MM 2 BLNT TIP (CANNULA) ×3 IMPLANT
CATH EMB 4FR 80CM (CATHETERS) ×2 IMPLANT
CLIP VESOCCLUDE MED 6/CT (CLIP) ×3 IMPLANT
CLIP VESOCCLUDE SM WIDE 6/CT (CLIP) ×3 IMPLANT
COVER WAND RF STERILE (DRAPES) ×1 IMPLANT
DECANTER SPIKE VIAL GLASS SM (MISCELLANEOUS) ×1 IMPLANT
DERMABOND ADVANCED (GAUZE/BANDAGES/DRESSINGS) ×2
DERMABOND ADVANCED .7 DNX12 (GAUZE/BANDAGES/DRESSINGS) ×1 IMPLANT
DRAPE INCISE IOBAN 66X45 STRL (DRAPES) ×1 IMPLANT
ELECT REM PT RETURN 9FT ADLT (ELECTROSURGICAL) ×3
ELECTRODE REM PT RTRN 9FT ADLT (ELECTROSURGICAL) ×1 IMPLANT
GAUZE SPONGE 2X2 8PLY STRL LF (GAUZE/BANDAGES/DRESSINGS) IMPLANT
GAUZE SPONGE 4X4 12PLY STRL LF (GAUZE/BANDAGES/DRESSINGS) ×2 IMPLANT
GLOVE BIO SURGEON STRL SZ7.5 (GLOVE) ×5 IMPLANT
GLOVE BIOGEL PI IND STRL 6.5 (GLOVE) IMPLANT
GLOVE BIOGEL PI IND STRL 7.5 (GLOVE) IMPLANT
GLOVE BIOGEL PI IND STRL 8 (GLOVE) ×1 IMPLANT
GLOVE BIOGEL PI INDICATOR 6.5 (GLOVE) ×2
GLOVE BIOGEL PI INDICATOR 7.5 (GLOVE) ×2
GLOVE BIOGEL PI INDICATOR 8 (GLOVE) ×2
GLOVE SURG SS PI 7.5 STRL IVOR (GLOVE) ×2 IMPLANT
GOWN STRL REUS W/ TWL LRG LVL3 (GOWN DISPOSABLE) ×3 IMPLANT
GOWN STRL REUS W/TWL LRG LVL3 (GOWN DISPOSABLE) ×6
GOWN STRL REUS W/TWL XL LVL3 (GOWN DISPOSABLE) ×2 IMPLANT
GRAFT GORETEX STRT 4-7X45 (Vascular Products) ×2 IMPLANT
KIT BASIN OR (CUSTOM PROCEDURE TRAY) ×3 IMPLANT
KIT TURNOVER KIT B (KITS) ×3 IMPLANT
NDL HYPO 25GX1X1/2 BEV (NEEDLE) ×1 IMPLANT
NEEDLE HYPO 25GX1X1/2 BEV (NEEDLE) ×3 IMPLANT
NS IRRIG 1000ML POUR BTL (IV SOLUTION) ×3 IMPLANT
PACK CV ACCESS (CUSTOM PROCEDURE TRAY) ×3 IMPLANT
PAD ARMBOARD 7.5X6 YLW CONV (MISCELLANEOUS) ×6 IMPLANT
SPONGE GAUZE 2X2 STER 10/PKG (GAUZE/BANDAGES/DRESSINGS) ×2
SPONGE SURGIFOAM ABS GEL 100 (HEMOSTASIS) ×2 IMPLANT
SUT ETHILON 3 0 PS 1 (SUTURE) ×2 IMPLANT
SUT PROLENE 6 0 BV (SUTURE) ×8 IMPLANT
SUT VIC AB 3-0 SH 27 (SUTURE) ×4
SUT VIC AB 3-0 SH 27X BRD (SUTURE) ×2 IMPLANT
SUT VICRYL 4-0 PS2 18IN ABS (SUTURE) ×6 IMPLANT
SWAB CULTURE LIQ STUART DBL (MISCELLANEOUS) ×2 IMPLANT
TAPE CLOTH SURG 4X10 WHT LF (GAUZE/BANDAGES/DRESSINGS) ×2 IMPLANT
TOWEL GREEN STERILE (TOWEL DISPOSABLE) ×3 IMPLANT
UNDERPAD 30X30 (UNDERPADS AND DIAPERS) ×3 IMPLANT
WATER STERILE IRR 1000ML POUR (IV SOLUTION) ×3 IMPLANT

## 2019-04-15 NOTE — Progress Notes (Signed)
[facility] Progress Note  Subjective: no new c/o, had HD [place], blood cx's + for staph, ID seeing pt  Vitals:   04/15/19 0245 04/15/19 0255 04/15/19 0714 04/15/19 0854  BP: 138/71 (!) 149/82 (!) 118/59   Pulse: 74 78 88   Resp:  18    Temp:  98 F (36.7 C)    TempSrc:  Oral    SpO2:  96%  96%  Weight:  87.5 kg    Height:        Inpatient medications: . amLODipine  10 mg Oral QHS  . aspirin EC  81 mg Oral Daily  . ferric citrate  630 mg Oral TID WC  . heparin  5,000 Units Subcutaneous Q8H  . hydrALAZINE  100 mg Oral TID  . labetalol  300 mg Oral TID  . sevelamer carbonate  800 mg Oral TID WC  . sodium chloride flush  3 mL Intravenous Q12H   . sodium chloride    . [START ON 04/16/2019]  ceFAZolin (ANCEF) IV     sodium chloride, acetaminophen, albuterol, ferric citrate, HYDROcodone-acetaminophen, ondansetron **OR** ondansetron (ZOFRAN) IV, sodium chloride flush    Exam: GEN: No acute distress, appears unwell, still ENT: NCAT EYES: EOMI CV: RRR, normal S1/S2, no murmur gallop or rub PULM: Clear bilaterally, normal work of breathing ABD: Soft, mild tenderness to palpation throughout, no rebound or guarding, quiet bowel sounds SKIN: No rashes or lesions VASCULAR: Right forearm AV graft with normal bruit and thrill, no identified ulcer/scab/bruising, dressing was not removed EXT: No peripheral edema   Outpt HD: [facility]  4h 81min  86kg   2/2.25 bath  450/1.5  P2  Hep 3000   RFA AVG  - mircera 50 ug last 6/2, is on hold currently  - calcitriol 1.5 ug tiw      Assessment/ Plan: 1. Hyperkalemia - sp HD last night 2. Staph bacteremia/ fevers - in combination w/ spont bleeding from AVG concerned about infected AVG.  Have called [facility] to see patient.  3. ESRD - HD TTS. Had HD last night, next HD likely 6/25 4. HTN - on 3 bp meds at home 5. Hep C 6. H/o CVA 7. H/o depression 8. Anemia ckd - not on esa now at op center    [facility] 04/15/2019, 2:01 PM  Iron/TIBC/Ferritin/ %Sat    Component Value Date/Time   IRON 50 08/03/2017 1028   TIBC 260 08/03/2017 1028   FERRITIN 305 08/03/2017 1028   IRONPCTSAT 19 08/03/2017 1028   Recent Labs  Lab 04/14/19 1420  04/15/19 0840  NA 131*  --  132*  K 6.3*  --  4.7  CL 86*  --  91*  CO2 21*  --  24  GLUCOSE 113*   < > 111*  BUN 80*  --  47*  CREATININE 15.82*  --  11.70*  CALCIUM 9.3  --  9.0  PHOS  --   --  5.8*  ALBUMIN 3.4*  --  2.8*  INR 1.2  --   --    < > = values in this interval not displayed.   Recent Labs  Lab 04/14/19 1420  AST 27  ALT 13  ALKPHOS 75  BILITOT 0.6  PROT 7.3   Recent Labs  Lab 04/15/19 0840  WBC 8.2  HGB 11.1*  HCT 32.3*  PLT 90*

## 2019-04-15 NOTE — Progress Notes (Signed)
Patient new admission to unit, arrived during shift change at 1940.  Patient lethargic, febrile and hypertensive on arrival.  Hospitalist notified and provided Hydralazine IV.  Patient pending dialysis, graft site located on Right lower arm.  Pressure dressing was intact from session earlier in outpatient facility, thrill and bruit present.  Blood pressures and fevers controlled before dialysis.  Patient had 1.4 liters of fluid removed during dialysis, with stable vitals.  Post dialysis patient complained to pain to site.  Swelling hematoma noted to access site.  Hospitalist, Hemodialysis Charge Nurse Ann, and Rapid Response called.  Ann with HD arrived to room to assess site she informed that she would contact Nephrologists to come and assess the site as it may require vascular intervention.  Patient stable at end of shift.  Provided Thorazine to left anterior thigh, due to complaints of hiccups x two days.  Will continue to monitor.

## 2019-04-15 NOTE — Progress Notes (Signed)
Pharmacy Antibiotic Note  Frank Fuller is a 58 y.o. male admitted on 04/14/2019 with MSSA bacteremia.  Pharmacy has been consulted for Cefazolin dosing.  The patient is ESRD-TTS, last HD early 6/23 AM. Cefazolin given appropriately post-HD. Given bacteremia and evaluation - it is likely that the patient may get off-schedule so will adjust Cefazolin to q24h dosing for now - next dose due 6/24.   Plan: - Adjust Cefazolin to 1g IV every 24 hours - next dose 6/24 - Will continue to follow HD schedule/duration, culture results, LOT, and antibiotic de-escalation plans   Height: 6' 3.5" (191.8 cm) Weight: 192 lb 14.4 oz (87.5 kg) IBW/kg (Calculated) : 85.65  Temp (24hrs), Avg:100.2 F (37.9 C), Min:98 F (36.7 C), Max:102.5 F (39.2 C)  Recent Labs  Lab 04/12/19 1100 04/14/19 1410 04/14/19 1420  WBC 8.2  --  8.3  CREATININE 7.68*  --  15.82*  LATICACIDVEN  --  1.4  --     Estimated Creatinine Clearance: 6 mL/min (A) (by C-G formula based on SCr of 15.82 mg/dL (H)).    Allergies  Allergen Reactions  . Chlorhexidine Other (See Comments)    Unknown reaction Patch skin test done at dialysis 06/26/17  - staff using clear dressing and alcohol to clean exit site of catheter  . Clonidine Derivatives Other (See Comments)    unresponsive  . Lisinopril Other (See Comments)    unresponsive  . Carvedilol Rash    Antimicrobials this admission: Cefepime 6/22 x 1 Vanc 6/22 x 1 Cefazolin 6/23 >>  Dose adjustments this admission: n/a  Microbiology results:  6/22 COVID >> neg 6/22 BCx >> 2/2 GPC (BCID MSSA)  Thank you for allowing pharmacy to be a part of this patient's care.  Alycia Rossetti, PharmD, BCPS Clinical Pharmacist Clinical phone for 04/15/2019: [phone] 04/15/2019 8:55 AM   **Pharmacist phone directory can now be found on [email or web address] ([email or web address]).  Listed under [facility].

## 2019-04-15 NOTE — Anesthesia Preprocedure Evaluation (Addendum)
Anesthesia Evaluation  Patient identified by MRN, date of birth, ID band Patient awake    Reviewed: Allergy & Precautions, NPO status , Patient's Chart, lab work & pertinent test results  Airway Mallampati: II  TM Distance: >3 FB Neck ROM: Full    Dental  (+) Edentulous Upper, Dental Advisory Given, Poor Dentition   Pulmonary Current Smoker,    Pulmonary exam normal breath sounds clear to auscultation       Cardiovascular hypertension, Pt. on home beta blockers and Pt. on medications Normal cardiovascular exam Rhythm:Regular Rate:Normal     Neuro/Psych Depression CVA    GI/Hepatic (+) Hepatitis -, C  Endo/Other    Renal/GU DialysisRenal disease     Musculoskeletal   Abdominal   Peds  Hematology   Anesthesia Other Findings   Reproductive/Obstetrics                            Anesthesia Physical Anesthesia Plan  ASA: III  Anesthesia Plan: General   Post-op Pain Management:    Induction: Intravenous  PONV Risk Score and Plan: Ondansetron and Dexamethasone  Airway Management Planned: Oral ETT  Additional Equipment:   Intra-op Plan:   Post-operative Plan: Extubation in OR  Informed Consent: I have reviewed the patients History and Physical, chart, labs and discussed the procedure including the risks, benefits and alternatives for the proposed anesthesia with the patient or authorized representative who has indicated his/her understanding and acceptance.     Dental advisory given  Plan Discussed with: Anesthesiologist, Surgeon and CRNA  Anesthesia Plan Comments:        Anesthesia Quick Evaluation

## 2019-04-15 NOTE — Progress Notes (Signed)
  Echocardiogram 2D Echocardiogram has been performed.  Bobbye Charleston 04/15/2019, 3:56 PM

## 2019-04-15 NOTE — Progress Notes (Signed)
Renal Navigator notified OP HD clinic/ of patient's admission and negative COVID 19 rapid test result in order to provide continuity of care and safety.  Nevyn Bossman Elizabeth, LCSW  Renal Navigator [phone] 

## 2019-04-15 NOTE — Anesthesia Procedure Notes (Signed)
Procedure Name: Intubation Performed by: Valetta Fuller, CRNA Pre-anesthesia Checklist: Patient identified, Emergency Drugs available, Suction available and Patient being monitored Patient Re-evaluated:Patient Re-evaluated prior to induction Oxygen Delivery Method: Circle system utilized Preoxygenation: Pre-oxygenation with 100% oxygen Induction Type: IV induction, Rapid sequence and Cricoid Pressure applied Laryngoscope Size: Miller and 2 Grade View: Grade I Tube type: Oral Tube size: 7.5 mm Number of attempts: 1 Airway Equipment and Method: Stylet Placement Confirmation: ETT inserted through vocal cords under direct vision,  breath sounds checked- equal and bilateral and positive ETCO2 Secured at: 23 cm Tube secured with: Tape Dental Injury: Teeth and Oropharynx as per pre-operative assessment

## 2019-04-15 NOTE — Progress Notes (Signed)
PHARMACY - PHYSICIAN COMMUNICATION CRITICAL VALUE ALERT - BLOOD CULTURE IDENTIFICATION (BCID)  Frank Fuller is an 58 y.o. male who presented to [facility] on 04/14/2019 with a chief complaint of fever/SOB/cough.    Assessment:  2/2 blood cultures growing MSSA.  Currently in HD, usual schedule TTSat  Name of physician (or Provider) Contacted: Tylene Fantasia  Current antibiotics: Vancomycin and Cefepime   Changes to prescribed antibiotics recommended:   Change to Ancef 2 g IV after each HD  Results for orders placed or performed during the hospital encounter of 04/14/19  Blood Culture ID Panel (Reflexed) (Collected: 04/14/2019  2:10 PM)  Result Value Ref Range   Enterococcus species NOT DETECTED NOT DETECTED   Listeria monocytogenes NOT DETECTED NOT DETECTED   Staphylococcus species DETECTED (A) NOT DETECTED   Staphylococcus aureus (BCID) DETECTED (A) NOT DETECTED   Methicillin resistance NOT DETECTED NOT DETECTED   Streptococcus species NOT DETECTED NOT DETECTED   Streptococcus agalactiae NOT DETECTED NOT DETECTED   Streptococcus pneumoniae NOT DETECTED NOT DETECTED   Streptococcus pyogenes NOT DETECTED NOT DETECTED   Acinetobacter baumannii NOT DETECTED NOT DETECTED   Enterobacteriaceae species NOT DETECTED NOT DETECTED   Enterobacter cloacae complex NOT DETECTED NOT DETECTED   Escherichia coli NOT DETECTED NOT DETECTED   Klebsiella oxytoca NOT DETECTED NOT DETECTED   Klebsiella pneumoniae NOT DETECTED NOT DETECTED   Proteus species NOT DETECTED NOT DETECTED   Serratia marcescens NOT DETECTED NOT DETECTED   Haemophilus influenzae NOT DETECTED NOT DETECTED   Neisseria meningitidis NOT DETECTED NOT DETECTED   Pseudomonas aeruginosa NOT DETECTED NOT DETECTED   Candida albicans NOT DETECTED NOT DETECTED   Candida glabrata NOT DETECTED NOT DETECTED   Candida krusei NOT DETECTED NOT DETECTED   Candida parapsilosis NOT DETECTED NOT DETECTED   Candida tropicalis NOT DETECTED NOT DETECTED     Caryl Pina 04/15/2019  3:20 AM

## 2019-04-15 NOTE — Anesthesia Postprocedure Evaluation (Signed)
Anesthesia Post Note  Patient: Frank Fuller  Procedure(s) Performed: REVISION OF RIGHT FOREARM ARTERIOVENOUS GORTEX GRAFT (Right Arm Lower)     Patient location during evaluation: PACU Anesthesia Type: General Level of consciousness: awake and alert Pain management: pain level controlled Vital Signs Assessment: post-procedure vital signs reviewed and stable Respiratory status: spontaneous breathing, nonlabored ventilation, respiratory function stable and patient connected to nasal cannula oxygen Cardiovascular status: blood pressure returned to baseline and stable Postop Assessment: no apparent nausea or vomiting Anesthetic complications: no    Last Vitals:  Vitals:   04/15/19 2228 04/15/19 2230  BP: 113/78 113/78  Pulse: 87 88  Resp: 20 20  Temp:  (!) 36.3 C  SpO2: 95% 95%    Last Pain:  Vitals:   04/15/19 2215  TempSrc:   PainSc: 7                  Basil Buffin COKER
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
## 2019-04-15 NOTE — Transfer of Care (Signed)
Immediate Anesthesia Transfer of Care Note  Patient: Frank Fuller  Procedure(s) Performed: REVISION OF RIGHT FOREARM ARTERIOVENOUS GORTEX GRAFT (Right Arm Lower)  Patient Location: PACU  Anesthesia Type:General  Level of Consciousness: awake, alert  and oriented  Airway & Oxygen Therapy: Patient connected to nasal cannula oxygen  Post-op Assessment: Report given to RN and Post -op Vital signs reviewed and stable  Post vital signs: Reviewed and stable  Last Vitals:  Vitals Value Taken Time  BP 145/82 04/15/19 2156  Temp 36.3 C 04/15/19 2156  Pulse 90 04/15/19 2156  Resp 22 04/15/19 2156  SpO2 96 % 04/15/19 2156    Last Pain:  Vitals:   04/15/19 2156  TempSrc:   PainSc: 8       Patients Stated Pain Goal: 0 (81/44/81 8563)  Complications: No apparent anesthesia complications
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
## 2019-04-15 NOTE — Op Note (Signed)
NAME: Frank Fuller    MRN: [id number] DOB: 1961-02-14    DATE OF OPERATION: 04/15/2019  PREOP DIAGNOSIS:    Possible infected right forearm AV graft  POSTOP DIAGNOSIS:    Possible infected hematoma adjacent to right forearm AV graft  PROCEDURE:    Revision of right forearm AV graft by replacing venous type of graft with interposition 7 mm PTFE  SURGEON: Judeth Cornfield. Scot Dock, MD, FACS  ASSIST: Arlee Muslim, PA  ANESTHESIA: General  EBL: Minimal  INDICATIONS:    Frank Fuller is a 58 y.o. male who was admitted with a fever and was found to have a pneumonia.  There was also some concerns about his right forearm graft as he had had some bleeding problems and a hematoma overlying the lateral aspect of the graft.  Given the concern for infection is brought to the operating room for exploration.  FINDINGS:   The graft was well incorporated proximal and distal to the area of concern.  I revised the venous half of the graft by bypassing medial to the old graft on the lateral aspect of the forearm.  Once this was closed and protected the hematoma was evacuated and cultures were sent.    TECHNIQUE:   The patient was taken to the operating room and received a general anesthetic.  The right arm was prepped and draped in usual sterile fashion.  Initially made an incision over the distal loop of the graft was dissected free.  Appeared to be well incorporated.  I then made a separate incision over the venous limb of the graft near the antecubital space in the lateral aspect of the forearm.  Again here the graft appeared well incorporated.  A tunnel was created between the 2 incisions medial to the old graft and a 7 mm PTFE graft tunnel between 2.  The patient was then heparinized.  The graft then clamped proximally and distally.  Initially I dressed the distal aspect of the graft.  The Fogarty catheter was passed multiple times up the arterial limb without evidence of obstruction.  This had  recently been revised in May.  No clot was retrieved.  The graft was flushed with heparinized saline and clamped.  The new segment of graft spatulated and sewn into into the spatulated old graft using continuous 6-0 Prolene suture.  The graft then pulled appropriate length for anastomosis to the graft upon the antecubital level.  Again I passed the Fogarty catheter multiple times and no clot was retrieved.  Each ends of the graft were beveled and cut the appropriate length and then the graft was sewn end-to-end with continuous 6-0 Prolene suture.  Hemostasis was obtained in this wound and the heparin was partially reversed with protamine.  Each of the wounds was closed with a deep layer of 3-0 Vicryl and the skin closed with 4-0 Vicryl.  Dermabond was then applied.  Once these wounds were protected elliptical incision was made encompassing the area of devitalized skin and hematoma was evacuated.  Intraoperative culture was sent.  The graft in this area was significantly degenerative and damaged in this area was excised.  Wound was irrigated.  The wound was then closed with interrupted 3 oh nylons.  Sterile dressing was applied.  Patient tolerated the procedure well was transferred to the recovery room in stable condition.  All needle and sponge counts were correct.  [name], MD, FACS [facility]  DATE OF DICTATION:   04/15/2019

## 2019-04-15 NOTE — Progress Notes (Signed)
PROGRESS NOTE    Frank Fuller  HLK:[id number] DOB: Apr 05, 1961 DOA: 04/14/2019 PCP: [facility]:   Brief Narrative:  As per he H&P "Frank Fuller  is a 58 y.o. male, with past medical history significant for ESRD, diabetes mellitus, CVA was presenting today with 2 days history of fever, shortness of breath and mild cough.  He reports also pleuritic chest pain.  The patient was scheduled for dialysis today however transferred to the emergency room due to the fever.  Patient also complains of diffuse abdominal pain but no diarrhea  . No recent exposure to COVID positive patient's Work-up in the emergency room right middle and lower lobe pneumonia.  His white blood cell count was 8.3 hemoglobin, platelets 92,000.  His potassium was 6.3 and troponin was 0.10.  Discussed with nephrology and the patient will have dialysis today after his COVID-19 was negative.  Patient started on IV antibiotics for treatment of hospital-acquired pneumonia".  04/15/2019: Patient was admitted with oxacillin sensitive Staphylococcus aureus bacteremia/sepsis.  Apparently, this occurred during dialysis.  Patient feels a lot better today.  No fever or chills.  Patient continues to have hiccups.  Will follow final cultures.  Nephrology input is appreciated.  Further management will depend on hospital course.  Assessment & Plan:   Active Problems:   Pneumonia   Hospital-acquired pneumonia  Bacteremia/sepsis secondary to staph species: Follow final cultures Continue IV cefazolin. IV vancomycin/cefepime have been discontinued. Further management will depend on hospital course.  ESRD on hemodialysis: Hemodialysis team is managing.  Elevated troponin: Likely type II elevation. No chest pain.  Hypertension:  Continue to optimize.  History of hep C and CVA.   DVT prophylaxis: Subacute heparin Code Status: Full code Family Communication:  Disposition Plan: Home   Consultants:    Nephrology  Procedures:   Hemodialysis  Antimicrobials:   IV cefazolin  IV vancomycin discontinued  IV cefepime discontinued   Subjective: No fever or chills. Patient continues to have hiccups.  Objective: Vitals:   04/15/19 0245 04/15/19 0255 04/15/19 0714 04/15/19 0854  BP: 138/71 (!) 149/82 (!) 118/59   Pulse: 74 78 88   Resp:  18    Temp:  98 F (36.7 C)    TempSrc:  Oral    SpO2:  96%  96%  Weight:  87.5 kg    Height:        Intake/Output Summary (Last 24 hours) at 04/15/2019 0948 Last data filed at 04/15/2019 0255 Gross per 24 hour  Intake 103 ml  Output 1405 ml  Net -1302 ml   Filed Weights   04/14/19 1400 04/15/19 0015 04/15/19 0255  Weight: 93 kg 88.9 kg 87.5 kg    Examination:  General exam: Appears calm and comfortable  Respiratory system: Clear to auscultation.  Cardiovascular system: S1 & S2.  Gastrointestinal system: Abdomen is nondistended, soft and nontender. No organomegaly or masses felt. Normal bowel sounds heard. Central nervous system: Alert and oriented.  Moves all extremities.   Extremities: No leg edema.  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  Data Reviewed: I have personally reviewed following labs and imaging studies  CBC: Recent Labs  Lab 04/12/19 1100 04/14/19 1420 04/15/19 0840  WBC 8.2 8.3 8.2  NEUTROABS 6.5 7.3  --   HGB 12.7* 12.2* 11.1*  HCT 37.8* 35.6* 32.3*  MCV 91.7 90.8 90.2  PLT 142* 92* 90*   Basic Metabolic Panel: Recent Labs  Lab 04/12/19 1100 04/14/19 1420 04/14/19 2232  NA 137 131*  --   K 4.2 6.3*  --   CL 95* 86*  --   CO2 28 21*  --   GLUCOSE 102* 113* 139*  BUN 25* 80*  --   CREATININE 7.68* 15.82*  --   CALCIUM 9.4 9.3  --    GFR: Estimated Creatinine Clearance: 6 mL/min (A) (by C-G formula based on SCr of 15.82 mg/dL (H)). Liver Function Tests: Recent Labs  Lab 04/12/19 1100 04/14/19 1420  AST 19 27  ALT 8 13  ALKPHOS 70 75  BILITOT 0.7 0.6  PROT 7.9 7.3   ALBUMIN 3.9 3.4*   Recent Labs  Lab 04/12/19 1100  LIPASE 31   No results for input(s): AMMONIA in the last 168 hours. Coagulation Profile: Recent Labs  Lab 04/14/19 1420  INR 1.2   Cardiac Enzymes: Recent Labs  Lab 04/14/19 1420  TROPONINI 0.10*   BNP (last 3 results) No results for input(s): PROBNP in the last 8760 hours. HbA1C: No results for input(s): HGBA1C in the last 72 hours. CBG: Recent Labs  Lab 04/14/19 1707 04/14/19 1830 04/14/19 2158  GLUCAP 103* 169* 147*   Lipid Profile: No results for input(s): CHOL, HDL, LDLCALC, TRIG, CHOLHDL, LDLDIRECT in the last 72 hours. Thyroid Function Tests: No results for input(s): TSH, T4TOTAL, FREET4, T3FREE, THYROIDAB in the last 72 hours. Anemia Panel: No results for input(s): VITAMINB12, FOLATE, FERRITIN, TIBC, IRON, RETICCTPCT in the last 72 hours. Urine analysis:    Component Value Date/Time   COLORURINE YELLOW 08/04/2017 1824   APPEARANCEUR HAZY (A) 08/04/2017 1824   LABSPEC 1.009 08/04/2017 1824   PHURINE 9.0 (H) 08/04/2017 1824   GLUCOSEU 150 (A) 08/04/2017 1824   HGBUR NEGATIVE 08/04/2017 1824   BILIRUBINUR NEGATIVE 08/04/2017 1824   KETONESUR NEGATIVE 08/04/2017 1824   PROTEINUR >=300 (A) 08/04/2017 1824   NITRITE NEGATIVE 08/04/2017 1824   LEUKOCYTESUR NEGATIVE 08/04/2017 1824   Sepsis Labs: @LABRCNTIP (procalcitonin:4,lacticidven:4)  ) Recent Results (from the past 240 hour(s))  SARS Coronavirus 2 (CEPHEID- Performed in [place] hospital lab), Hosp Order     Status: None   Collection Time: 04/12/19 10:50 AM   Specimen: Nasopharyngeal Swab  Result Value Ref Range Status   SARS Coronavirus 2 NEGATIVE NEGATIVE Final    Comment: (NOTE) If result is NEGATIVE SARS-CoV-2 target nucleic acids are NOT DETECTED. The SARS-CoV-2 RNA is generally detectable in upper and lower  respiratory specimens during the acute phase of infection. The lowest  concentration of SARS-CoV-2 viral copies this assay can  detect is 250  copies / mL. A negative result does not preclude SARS-CoV-2 infection  and should not be used as the sole basis for treatment or other  patient management decisions.  A negative result may occur with  improper specimen collection / handling, submission of specimen other  than nasopharyngeal swab, presence of viral mutation(s) within the  areas targeted by this assay, and inadequate number of viral copies  (<250 copies / mL). A negative result must be combined with clinical  observations, patient history, and epidemiological information. If result is POSITIVE SARS-CoV-2 target nucleic acids are DETECTED. The SARS-CoV-2 RNA is generally detectable in upper and lower  respiratory specimens dur ing the acute phase of infection.  Positive  results are indicative of active infection with SARS-CoV-2.  Clinical  correlation with patient history and other diagnostic information is  necessary to determine patient infection status.  Positive results do  not rule out bacterial infection or co-infection with  other viruses. If result is PRESUMPTIVE POSTIVE SARS-CoV-2 nucleic acids MAY BE PRESENT.   A presumptive positive result was obtained on the submitted specimen  and confirmed on repeat testing.  While 2019 novel coronavirus  (SARS-CoV-2) nucleic acids may be present in the submitted sample  additional confirmatory testing may be necessary for epidemiological  and / or clinical management purposes  to differentiate between  SARS-CoV-2 and other Sarbecovirus currently known to infect humans.  If clinically indicated additional testing with an alternate test  methodology [phone]) is advised. The SARS-CoV-2 RNA is generally  detectable in upper and lower respiratory sp ecimens during the acute  phase of infection. The expected result is Negative. Fact Sheet for Patients:  StrictlyIdeas.no Fact Sheet for Healthcare  Providers: BankingDealers.co.za This test is not yet approved or cleared by the [place] FDA and has been authorized for detection and/or diagnosis of SARS-CoV-2 by FDA under an Emergency Use Authorization (EUA).  This EUA will remain in effect (meaning this test can be used) for the duration of the COVID-19 declaration under Section 564(b)(1) of the Act, 21 U.S.C. section 360bbb-3(b)(1), unless the authorization is terminated or revoked sooner. Performed at [facility], [place] [address] [place], [place] [address]   Culture, blood (routine x 2)     Status: None (Preliminary result)   Collection Time: 04/14/19  2:10 PM   Specimen: BLOOD  Result Value Ref Range Status   Specimen Description BLOOD LEFT WRIST  Final   Special Requests   Final    BOTTLES DRAWN AEROBIC AND ANAEROBIC Blood Culture adequate volume   Culture  Setup Time   Final    IN BOTH AEROBIC AND ANAEROBIC BOTTLES GRAM POSITIVE COCCI CRITICAL RESULT CALLED TO, READ BACK BY AND VERIFIED WITH: G. ABBOTT,PHARMD 0301 04/15/2019 Mena Goes Performed at [facility], [place] [address] [place], [place] [address]    Culture GRAM POSITIVE COCCI  Final   Report Status PENDING  Incomplete  Blood Culture ID Panel (Reflexed)     Status: Abnormal   Collection Time: 04/14/19  2:10 PM  Result Value Ref Range Status   Enterococcus species NOT DETECTED NOT DETECTED Final   Listeria monocytogenes NOT DETECTED NOT DETECTED Final   Staphylococcus species DETECTED (A) NOT DETECTED Final    Comment: CRITICAL RESULT CALLED TO, READ BACK BY AND VERIFIED WITH: G. ABBOTT,PHARMD 0301 04/15/2019 T. TYSOR    Staphylococcus aureus (BCID) DETECTED (A) NOT DETECTED Final    Comment: Methicillin (oxacillin) susceptible Staphylococcus aureus (MSSA). Preferred therapy is anti staphylococcal beta lactam antibiotic (Cefazolin or Nafcillin), unless clinically contraindicated. CRITICAL RESULT CALLED TO, READ BACK BY AND  VERIFIED WITH: G. ABBOTT,PHARMD 0301 04/15/2019 T. TYSOR    Methicillin resistance NOT DETECTED NOT DETECTED Final   Streptococcus species NOT DETECTED NOT DETECTED Final   Streptococcus agalactiae NOT DETECTED NOT DETECTED Final   Streptococcus pneumoniae NOT DETECTED NOT DETECTED Final   Streptococcus pyogenes NOT DETECTED NOT DETECTED Final   Acinetobacter baumannii NOT DETECTED NOT DETECTED Final   Enterobacteriaceae species NOT DETECTED NOT DETECTED Final   Enterobacter cloacae complex NOT DETECTED NOT DETECTED Final   Escherichia coli NOT DETECTED NOT DETECTED Final   Klebsiella oxytoca NOT DETECTED NOT DETECTED Final   Klebsiella pneumoniae NOT DETECTED NOT DETECTED Final   Proteus species NOT DETECTED NOT DETECTED Final   Serratia marcescens NOT DETECTED NOT DETECTED Final   Haemophilus influenzae NOT DETECTED NOT DETECTED Final   Neisseria meningitidis NOT DETECTED NOT  DETECTED Final   Pseudomonas aeruginosa NOT DETECTED NOT DETECTED Final   Candida albicans NOT DETECTED NOT DETECTED Final   Candida glabrata NOT DETECTED NOT DETECTED Final   Candida krusei NOT DETECTED NOT DETECTED Final   Candida parapsilosis NOT DETECTED NOT DETECTED Final   Candida tropicalis NOT DETECTED NOT DETECTED Final    Comment: Performed at [facility], [place] [address] [place], [place] [address]  Culture, blood (routine x 2)     Status: None (Preliminary result)   Collection Time: 04/14/19  2:20 PM   Specimen: BLOOD  Result Value Ref Range Status   Specimen Description BLOOD BLOOD LEFT FOREARM  Final   Special Requests   Final    BOTTLES DRAWN AEROBIC AND ANAEROBIC Blood Culture results may not be optimal due to an inadequate volume of blood received in culture bottles   Culture  Setup Time   Final    GRAM POSITIVE COCCI IN CLUSTERS IN BOTH AEROBIC AND ANAEROBIC BOTTLES CRITICAL VALUE NOTED.  VALUE IS CONSISTENT WITH PREVIOUSLY REPORTED AND CALLED VALUE. Performed at [place], [place] [address] [place], [place] [address]    Culture [facility] POSITIVE COCCI  Final   Report Status PENDING  Incomplete  SARS Coronavirus 2 (CEPHEID - Performed in [place] hospital lab), Hosp Order     Status: None   Collection Time: 04/14/19  2:29 PM   Specimen: Nasopharyngeal Swab  Result Value Ref Range Status   SARS Coronavirus 2 NEGATIVE NEGATIVE Final    Comment: (NOTE) If result is NEGATIVE SARS-CoV-2 target nucleic acids are NOT DETECTED. The SARS-CoV-2 RNA is generally detectable in upper and lower  respiratory specimens during the acute phase of infection. The lowest  concentration of SARS-CoV-2 viral copies this assay can detect is 250  copies / mL. A negative result does not preclude SARS-CoV-2 infection  and should not be used as the sole basis for treatment or other  patient management decisions.  A negative result may occur with  improper specimen collection / handling, submission of specimen other  than nasopharyngeal swab, presence of viral mutation(s) within the  areas targeted by this assay, and inadequate number of viral copies  (<250 copies / mL). A negative result must be combined with clinical  observations, patient history, and epidemiological information. If result is POSITIVE SARS-CoV-2 target nucleic acids are DETECTED. The SARS-CoV-2 RNA is generally detectable in upper and lower  respiratory specimens dur ing the acute phase of infection.  Positive  results are indicative of active infection with SARS-CoV-2.  Clinical  correlation with patient history and other diagnostic information is  necessary to determine patient infection status.  Positive results do  not rule out bacterial infection or co-infection with other viruses. If result is PRESUMPTIVE POSTIVE SARS-CoV-2 nucleic acids MAY BE PRESENT.   A presumptive positive result was obtained on the submitted specimen  and confirmed on repeat testing.  While 2019 novel coronavirus  (SARS-CoV-2) nucleic  acids may be present in the submitted sample  additional confirmatory testing may be necessary for epidemiological  and / or clinical management purposes  to differentiate between  SARS-CoV-2 and other Sarbecovirus currently known to infect humans.  If clinically indicated additional testing with an alternate test  methodology [phone]) is advised. The SARS-CoV-2 RNA is generally  detectable in upper and lower respiratory sp ecimens during the acute  phase of infection. The expected result is Negative. Fact Sheet for Patients:  StrictlyIdeas.no Fact Sheet for  Healthcare Providers: BankingDealers.co.za This test is not yet approved or cleared by the [place] and has been authorized for detection and/or diagnosis of SARS-CoV-2 by FDA under an Emergency Use Authorization (EUA).  This EUA will remain in effect (meaning this test can be used) for the duration of the COVID-19 declaration under Section 564(b)(1) of the Act, 21 U.S.C. section 360bbb-3(b)(1), unless the authorization is terminated or revoked sooner. Performed at [facility], [place] [address], [place],  [address]          Radiology Studies: Ct Abdomen Pelvis Wo Contrast  Result Date: 04/14/2019 CLINICAL DATA:  Abdominal pain, fever. Suspected abscess. EXAM: CT CHEST, ABDOMEN AND PELVIS WITHOUT CONTRAST TECHNIQUE: Multidetector CT imaging of the chest, abdomen and pelvis was performed following the standard protocol without IV contrast. COMPARISON:  04/12/2019 FINDINGS: CT CHEST FINDINGS Cardiovascular: Enlarged heart. Small pericardial effusion measuring 7 mm in maximum thickness. Calcific atherosclerotic disease of the coronary arteries and aorta. Mediastinum/Nodes: No enlarged mediastinal, hilar, or axillary lymph nodes. Thyroid gland, and trachea demonstrate no significant findings. Diffuse mild dilation of the esophagus. Lungs/Pleura: Scattered ground-glass  opacities in the right lower lobe and right middle lobe, with minimal ground-glass opacification of the rest of the lung parenchyma. Musculoskeletal: No chest wall mass or suspicious bone lesions identified. CT ABDOMEN PELVIS FINDINGS Hepatobiliary: Normal appearance of the liver. Vicarious excretion of contrast in the gallbladder, likely residual from the CT scan dated 04/12/2019 Pancreas: Unremarkable. No pancreatic ductal dilatation or surrounding inflammatory changes. Spleen: Normal in size without focal abnormality. Adrenals/Urinary Tract: Numerous hypoattenuated lesions throughout both kidneys, incompletely evaluated due to lack of IV contrast. The largest 2.9 cm mass in the superior pole of the right kidney does not satisfy the criteria for simple cyst. Atrophic appearance of the kidneys. The minimally distended urinary bladder contains excreted contrast. Stomach/Bowel: Stomach is within normal limits. Appendix appears normal. No evidence of bowel wall thickening, distention, or inflammatory changes. Vascular/Lymphatic: Calcific atherosclerotic disease and tortuosity of the aorta. Again seen is a fusiform abdominal aortic aneurysm of the infrarenal abdominal aorta with maximum AP measurement of 3.5 cm. Reproductive: Prostate is unremarkable. Other: No abdominal wall hernia or abnormality. No abdominopelvic ascites. Musculoskeletal: Mild multilevel spondylosis of the spine. Advanced spondylosis at L4-L5 and L5-S1. IMPRESSION: 1. Interval development of confluent ground-glass airspace opacifications in the right lower and right middle lobes, with minimal ground-glass opacification of the rest of the lung parenchyma. Findings may represent asymmetric edema, however atypical/viral pneumonia, including COVID pneumonia is also of consideration. 2. Numerous hypoattenuated lesions throughout both kidneys, incompletely evaluated due to lack of IV contrast. The largest 2.9 cm mass in the superior pole of the right  kidney does not satisfy the criteria for a simple cyst. It may represent a hemorrhagic cyst, or a solid renal mass. 3. Enlarged heart with small pericardial effusion. 4. Calcific atherosclerotic disease of the coronary arteries and aorta. 5. Diffuse dilation of the esophagus with circumferential mucosal thickening of the distal esophagus, suggestive of esophagitis/dysmotility. 6. Fusiform infrarenal abdominal aortic aneurysm with maximum AP measurement of 3.5 cm. Recommend followup by ultrasound in 2 years. This recommendation follows ACR consensus guidelines: White Paper of the ACR Incidental Findings Committee II on Vascular Findings. J Am Coll Radiol 2013; 10:789-794. Electronically Signed   By: Fidela Salisbury M.D.   On: 04/14/2019 15:38   Ct Chest Wo Contrast  Result Date: 04/14/2019 CLINICAL DATA:  Abdominal pain, fever. Suspected abscess. EXAM: CT CHEST, ABDOMEN AND  PELVIS WITHOUT CONTRAST TECHNIQUE: Multidetector CT imaging of the chest, abdomen and pelvis was performed following the standard protocol without IV contrast. COMPARISON:  04/12/2019 FINDINGS: CT CHEST FINDINGS Cardiovascular: Enlarged heart. Small pericardial effusion measuring 7 mm in maximum thickness. Calcific atherosclerotic disease of the coronary arteries and aorta. Mediastinum/Nodes: No enlarged mediastinal, hilar, or axillary lymph nodes. Thyroid gland, and trachea demonstrate no significant findings. Diffuse mild dilation of the esophagus. Lungs/Pleura: Scattered ground-glass opacities in the right lower lobe and right middle lobe, with minimal ground-glass opacification of the rest of the lung parenchyma. Musculoskeletal: No chest wall mass or suspicious bone lesions identified. CT ABDOMEN PELVIS FINDINGS Hepatobiliary: Normal appearance of the liver. Vicarious excretion of contrast in the gallbladder, likely residual from the CT scan dated 04/12/2019 Pancreas: Unremarkable. No pancreatic ductal dilatation or surrounding  inflammatory changes. Spleen: Normal in size without focal abnormality. Adrenals/Urinary Tract: Numerous hypoattenuated lesions throughout both kidneys, incompletely evaluated due to lack of IV contrast. The largest 2.9 cm mass in the superior pole of the right kidney does not satisfy the criteria for simple cyst. Atrophic appearance of the kidneys. The minimally distended urinary bladder contains excreted contrast. Stomach/Bowel: Stomach is within normal limits. Appendix appears normal. No evidence of bowel wall thickening, distention, or inflammatory changes. Vascular/Lymphatic: Calcific atherosclerotic disease and tortuosity of the aorta. Again seen is a fusiform abdominal aortic aneurysm of the infrarenal abdominal aorta with maximum AP measurement of 3.5 cm. Reproductive: Prostate is unremarkable. Other: No abdominal wall hernia or abnormality. No abdominopelvic ascites. Musculoskeletal: Mild multilevel spondylosis of the spine. Advanced spondylosis at L4-L5 and L5-S1. IMPRESSION: 1. Interval development of confluent ground-glass airspace opacifications in the right lower and right middle lobes, with minimal ground-glass opacification of the rest of the lung parenchyma. Findings may represent asymmetric edema, however atypical/viral pneumonia, including COVID pneumonia is also of consideration. 2. Numerous hypoattenuated lesions throughout both kidneys, incompletely evaluated due to lack of IV contrast. The largest 2.9 cm mass in the superior pole of the right kidney does not satisfy the criteria for a simple cyst. It may represent a hemorrhagic cyst, or a solid renal mass. 3. Enlarged heart with small pericardial effusion. 4. Calcific atherosclerotic disease of the coronary arteries and aorta. 5. Diffuse dilation of the esophagus with circumferential mucosal thickening of the distal esophagus, suggestive of esophagitis/dysmotility. 6. Fusiform infrarenal abdominal aortic aneurysm with maximum AP measurement of  3.5 cm. Recommend followup by ultrasound in 2 years. This recommendation follows ACR consensus guidelines: White Paper of the ACR Incidental Findings Committee II on Vascular Findings. J Am Coll Radiol 2013; 10:789-794. Electronically Signed   By: Fidela Salisbury M.D.   On: 04/14/2019 15:38   Dg Chest Port 1 View  Result Date: 04/14/2019 CLINICAL DATA:  Fever. EXAM: PORTABLE CHEST 1 VIEW COMPARISON:  Radiograph of April 12, 2019. FINDINGS: Stable cardiomegaly. Atherosclerosis of thoracic aorta is noted. No pneumothorax or pleural effusion is noted. Left lung is clear. Mild right basilar atelectasis or infiltrate is noted. Bony thorax unremarkable. IMPRESSION: Mild right basilar atelectasis or infiltrate is noted. Aortic Atherosclerosis (ICD10-I70.0). Electronically Signed   By: Marijo Conception M.D.   On: 04/14/2019 14:34        Scheduled Meds:  amLODipine  10 mg Oral QHS   aspirin EC  81 mg Oral Daily   ferric citrate  630 mg Oral TID WC   heparin  5,000 Units Subcutaneous Q8H   hydrALAZINE  100 mg Oral TID   labetalol  300  mg Oral TID   sevelamer carbonate  800 mg Oral TID WC   sodium chloride flush  3 mL Intravenous Q12H   Continuous Infusions:  sodium chloride     [START ON 04/16/2019]  ceFAZolin (ANCEF) IV       LOS: 1 day    Time spent: 35 Minutes.    Dana Allan, MD  [facility] Pager #: [phone] 7PM-7AM contact night coverage as above

## 2019-04-15 NOTE — Significant Event (Signed)
Rapid Response Event Note  Overview:Called d/t bleeding AV graft with enlarging hematoma formation. Time Called: 0631 Arrival Time: 0638    Initial Focused Assessment: Pt laying in bed, alert and oriented, VSS, R AVG bleeding under cannulation site, very tender to touch, enlarging hematoma present.   Interventions: Mild pressure held X 20 minutes until HD RN arrived to evaluate. Plan of Care (if not transferred): Per HD RN Event Summary: Name of Physician Notified: Baltazar Najjar, NP at [phone]  Name of Consulting Physician Notified: HD RN at [phone], [phone], [phone], [phone])  Outcome: Stayed in room and stabalized  Event End Time: 0715  Dillard Essex

## 2019-04-15 NOTE — Plan of Care (Signed)
Care plan and goals discussed with patient.  Patient verbalizes understanding with goals and interventions.   Problem: Education: Goal: Knowledge of General Education information will improve Description: Including pain rating scale, medication(s)/side effects and non-pharmacologic comfort measures Outcome: Progressing   Problem: Health Behavior/Discharge Planning: Goal: Ability to manage health-related needs will improve Outcome: Progressing   Problem: Clinical Measurements: Goal: Ability to maintain clinical measurements within normal limits will improve Outcome: Progressing Goal: Will remain free from infection Outcome: Progressing Goal: Diagnostic test results will improve Outcome: Progressing Goal: Respiratory complications will improve Outcome: Progressing Goal: Cardiovascular complication will be avoided Outcome: Progressing   Problem: Activity: Goal: Risk for activity intolerance will decrease Outcome: Progressing   Problem: Nutrition: Goal: Adequate nutrition will be maintained Outcome: Progressing   Problem: Coping: Goal: Level of anxiety will decrease Outcome: Progressing   Problem: Elimination: Goal: Will not experience complications related to bowel motility Outcome: Progressing Goal: Will not experience complications related to urinary retention Outcome: Progressing   Problem: Pain Managment: Goal: General experience of comfort will improve Outcome: Progressing   Problem: Safety: Goal: Ability to remain free from injury will improve Outcome: Progressing   Problem: Skin Integrity: Goal: Risk for impaired skin integrity will decrease Outcome: Progressing

## 2019-04-16 ENCOUNTER — Encounter (HOSPITAL_COMMUNITY): Payer: Self-pay | Admitting: Vascular Surgery

## 2019-04-16 LAB — RENAL FUNCTION PANEL
Albumin: 2.9 g/dL — ABNORMAL LOW (ref 3.5–5.0)
Anion gap: 18 — ABNORMAL HIGH (ref 5–15)
BUN: 72 mg/dL — ABNORMAL HIGH (ref 6–20)
CO2: 22 mmol/L (ref 22–32)
Calcium: 8.9 mg/dL (ref 8.9–10.3)
Chloride: 89 mmol/L — ABNORMAL LOW (ref 98–111)
Creatinine, Ser: 13.71 mg/dL — ABNORMAL HIGH (ref 0.61–1.24)
GFR calc Af Amer: 4 mL/min — ABNORMAL LOW (ref >60)
GFR calc non Af Amer: 3 mL/min — ABNORMAL LOW (ref >60)
Glucose, Bld: 134 mg/dL — ABNORMAL HIGH (ref 70–99)
Phosphorus: 6.4 mg/dL — ABNORMAL HIGH (ref 2.5–4.6)
Potassium: 5.9 mmol/L — ABNORMAL HIGH (ref 3.5–5.1)
Sodium: 129 mmol/L — ABNORMAL LOW (ref 135–145)

## 2019-04-16 LAB — CBC WITH DIFFERENTIAL/PLATELET
Abs Immature Granulocytes: 0.04 10*3/uL (ref 0.00–0.07)
Basophils Absolute: 0 10*3/uL (ref 0.0–0.1)
Basophils Relative: 0 %
Eosinophils Absolute: 0 10*3/uL (ref 0.0–0.5)
Eosinophils Relative: 0 %
HCT: 30.9 % — ABNORMAL LOW (ref 39.0–52.0)
Hemoglobin: 10.6 g/dL — ABNORMAL LOW (ref 13.0–17.0)
Immature Granulocytes: 1 %
Lymphocytes Relative: 5 %
Lymphs Abs: 0.4 10*3/uL — ABNORMAL LOW (ref 0.7–4.0)
MCH: 31.2 pg (ref 26.0–34.0)
MCHC: 34.3 g/dL (ref 30.0–36.0)
MCV: 90.9 fL (ref 80.0–100.0)
Monocytes Absolute: 0.9 10*3/uL (ref 0.1–1.0)
Monocytes Relative: 11 %
Neutro Abs: 6.8 10*3/uL (ref 1.7–7.7)
Neutrophils Relative %: 83 %
Platelets: 96 10*3/uL — ABNORMAL LOW (ref 150–400)
RBC: 3.4 MIL/uL — ABNORMAL LOW (ref 4.22–5.81)
RDW: 17.2 % — ABNORMAL HIGH (ref 11.5–15.5)
WBC: 8.1 10*3/uL (ref 4.0–10.5)
nRBC: 0 % (ref 0.0–0.2)

## 2019-04-16 LAB — HIV ANTIBODY (ROUTINE TESTING W REFLEX): HIV Screen 4th Generation wRfx: NONREACTIVE

## 2019-04-16 MED ORDER — SODIUM ZIRCONIUM CYCLOSILICATE 10 G PO PACK
10.0000 g | PACK | Freq: Every day | ORAL | Status: AC
  Administered 2019-04-16: 10 g via ORAL
  Filled 2019-04-16: qty 1

## 2019-04-16 MED ORDER — CHLORHEXIDINE GLUCONATE CLOTH 2 % EX PADS
6.0000 | MEDICATED_PAD | Freq: Every day | CUTANEOUS | Status: DC
  Administered 2019-04-17 – 2019-04-18 (×2): 6 via TOPICAL

## 2019-04-16 MED FILL — Thrombin (Recombinant) For Soln 20000 Unit: CUTANEOUS | Qty: 1 | Status: AC

## 2019-04-16 NOTE — Progress Notes (Signed)
[facility] for Infectious Disease  Date of Admission:  04/14/2019     Total days of antibiotics 3         ASSESSMENT/PLAN  Frank Fuller is a 58 year old male admitted with fever, abdominal pain, and nausea with concern for pneumonia on CT scan and found to have MSSA bacteremia with potential source being his right fistula given new onset pain and bleeding.  Antimicrobial therapy was narrowed to Ancef with identification of MSSA.  Evaluated by vascular surgery now status post revision of right forearm AV graft by replacing venous type of graft with interposition 7 mm PTFE.  Surgical specimens with Gram stain showing gram-positive cocci and culture pending.  MSSA bacteremia -repeat blood cultures drawn today.  2D echocardiogram with no evidence of endocarditis and will likely require TEE.  Most likely source of infection being the right AV fistula s/p replacement of segment.  Clinically no evidence of pneumonia today. Continue current dose of Ancef.  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  Continue wound management and care per vascular surgery.  End-stage renal disease on dialysis - continuing on intermittent hemodialysis per nephrology.   Principal Problem:   MSSA bacteremia Active Problems:   Pneumonia   Hospital-acquired pneumonia   . amLODipine  10 mg Oral QHS  . aspirin EC  81 mg Oral Daily  . [START ON 04/17/2019] Chlorhexidine Gluconate Cloth  6 each Topical Q0600  . ferric citrate  630 mg Oral TID WC  . heparin  5,000 Units Subcutaneous Q8H  . hydrALAZINE  100 mg Oral TID  . labetalol  300 mg Oral TID  . sevelamer carbonate  800 mg Oral TID WC  . sodium chloride flush  3 mL Intravenous Q12H    SUBJECTIVE:  Afebrile overnight without leukocytosis. Having arm swelling today following surgery.   Allergies  Allergen Reactions  .  Chlorhexidine Other (See Comments)    Unknown reaction Patch skin test done at dialysis 06/26/17  - staff using clear dressing and alcohol to clean exit site of catheter  . Clonidine Derivatives Other (See Comments)    unresponsive  . Lisinopril Other (See Comments)    unresponsive  . Carvedilol Rash     Review of Systems: Review of Systems  Constitutional: Negative for chills, fever and weight loss.  Respiratory: Negative for cough, shortness of breath and wheezing.   Cardiovascular: Negative for chest pain and leg swelling.  Gastrointestinal: Negative for abdominal pain, constipation, diarrhea, nausea and vomiting.  Musculoskeletal:       Positive for right arm swelling.   Skin: Negative for rash.      OBJECTIVE: Vitals:   04/15/19 2228 04/15/19 2230 04/16/19 0100 04/16/19 0755  BP: 113/78 113/78 (!) 120/55 132/87  Pulse: 87 88  76  Resp: 20 20  19   Temp:  (!) 97.3 F (36.3 C) 98 F (36.7 C) 97.8 F (36.6 C)  TempSrc:   Oral   SpO2: 95% 95% 95% 94%  Weight:      Height:       Body mass index is 23.79 kg/m.  Physical Exam Constitutional:      General: He is not in acute distress.    Appearance: He is well-developed.  Cardiovascular:     Rate and Rhythm: Normal rate and regular rhythm.     Heart sounds: Normal heart sounds.  Comments: Surgical site appears well approximated with no drainage. There is mild/moderate edema. Distal pulses and sensation are intact and appropriate.  Pulmonary:     Effort: Pulmonary effort is normal.     Breath sounds: Normal breath sounds.  Skin:    General: Skin is warm and dry.  Neurological:     Mental Status: He is alert and oriented to person, place, and time.  Psychiatric:        Behavior: Behavior normal.        Thought Content: Thought content normal.        Judgment: Judgment normal.     Lab Results Lab Results  Component Value Date   WBC 8.1 04/16/2019   HGB 10.6 (L) 04/16/2019   HCT 30.9 (L) 04/16/2019   MCV  90.9 04/16/2019   PLT 96 (L) 04/16/2019    Lab Results  Component Value Date   CREATININE 13.71 (H) 04/16/2019   BUN 72 (H) 04/16/2019   NA 129 (L) 04/16/2019   K 5.9 (H) 04/16/2019   CL 89 (L) 04/16/2019   CO2 22 04/16/2019    Lab Results  Component Value Date   ALT 13 04/14/2019   AST 27 04/14/2019   ALKPHOS 75 04/14/2019   BILITOT 0.6 04/14/2019     Microbiology: Recent Results (from the past 240 hour(s))  SARS Coronavirus 2 (CEPHEID- Performed in [place] hospital lab), Hosp Order     Status: None   Collection Time: 04/12/19 10:50 AM   Specimen: Nasopharyngeal Swab  Result Value Ref Range Status   SARS Coronavirus 2 NEGATIVE NEGATIVE Final    Comment: (NOTE) If result is NEGATIVE SARS-CoV-2 target nucleic acids are NOT DETECTED. The SARS-CoV-2 RNA is generally detectable in upper and lower  respiratory specimens during the acute phase of infection. The lowest  concentration of SARS-CoV-2 viral copies this assay can detect is 250  copies / mL. A negative result does not preclude SARS-CoV-2 infection  and should not be used as the sole basis for treatment or other  patient management decisions.  A negative result may occur with  improper specimen collection / handling, submission of specimen other  than nasopharyngeal swab, presence of viral mutation(s) within the  areas targeted by this assay, and inadequate number of viral copies  (<250 copies / mL). A negative result must be combined with clinical  observations, patient history, and epidemiological information. If result is POSITIVE SARS-CoV-2 target nucleic acids are DETECTED. The SARS-CoV-2 RNA is generally detectable in upper and lower  respiratory specimens dur ing the acute phase of infection.  Positive  results are indicative of active infection with SARS-CoV-2.  Clinical  correlation with patient history and other diagnostic information is  necessary to determine patient infection status.  Positive  results do  not rule out bacterial infection or co-infection with other viruses. If result is PRESUMPTIVE POSTIVE SARS-CoV-2 nucleic acids MAY BE PRESENT.   A presumptive positive result was obtained on the submitted specimen  and confirmed on repeat testing.  While 2019 novel coronavirus  (SARS-CoV-2) nucleic acids may be present in the submitted sample  additional confirmatory testing may be necessary for epidemiological  and / or clinical management purposes  to differentiate between  SARS-CoV-2 and other Sarbecovirus currently known to infect humans.  If clinically indicated additional testing with an alternate test  methodology [phone]) is advised. The SARS-CoV-2 RNA is generally  detectable in upper and lower respiratory sp ecimens during the acute  phase of infection.  The expected result is Negative. Fact Sheet for Patients:  StrictlyIdeas.no Fact Sheet for Healthcare Providers: BankingDealers.co.za This test is not yet approved or cleared by the [place] FDA and has been authorized for detection and/or diagnosis of SARS-CoV-2 by FDA under an Emergency Use Authorization (EUA).  This EUA will remain in effect (meaning this test can be used) for the duration of the COVID-19 declaration under Section 564(b)(1) of the Act, 21 U.S.C. section 360bbb-3(b)(1), unless the authorization is terminated or revoked sooner. Performed at [facility], [place] [address], [place], [place] [address]   Culture, blood (routine x 2)     Status: Abnormal (Preliminary result)   Collection Time: 04/14/19  2:10 PM   Specimen: BLOOD  Result Value Ref Range Status   Specimen Description BLOOD LEFT WRIST  Final   Special Requests   Final    BOTTLES DRAWN AEROBIC AND ANAEROBIC Blood Culture adequate volume   Culture  Setup Time   Final    IN BOTH AEROBIC AND ANAEROBIC BOTTLES GRAM POSITIVE COCCI CRITICAL RESULT CALLED TO, READ BACK BY AND  VERIFIED WITH: G. ABBOTT,PHARMD 0301 04/15/2019 T. TYSOR    Culture (A)  Final    STAPHYLOCOCCUS AUREUS SUSCEPTIBILITIES TO FOLLOW Performed at [facility], [place] [address], [place], [place] [address]    Report Status PENDING  Incomplete  Blood Culture ID Panel (Reflexed)     Status: Abnormal   Collection Time: 04/14/19  2:10 PM  Result Value Ref Range Status   Enterococcus species NOT DETECTED NOT DETECTED Final   Listeria monocytogenes NOT DETECTED NOT DETECTED Final   Staphylococcus species DETECTED (A) NOT DETECTED Final    Comment: CRITICAL RESULT CALLED TO, READ BACK BY AND VERIFIED WITH: G. ABBOTT,PHARMD 0301 04/15/2019 T. TYSOR    Staphylococcus aureus (BCID) DETECTED (A) NOT DETECTED Final    Comment: Methicillin (oxacillin) susceptible Staphylococcus aureus (MSSA). Preferred therapy is anti staphylococcal beta lactam antibiotic (Cefazolin or Nafcillin), unless clinically contraindicated. CRITICAL RESULT CALLED TO, READ BACK BY AND VERIFIED WITH: G. ABBOTT,PHARMD 0301 04/15/2019 T. TYSOR    Methicillin resistance NOT DETECTED NOT DETECTED Final   Streptococcus species NOT DETECTED NOT DETECTED Final   Streptococcus agalactiae NOT DETECTED NOT DETECTED Final   Streptococcus pneumoniae NOT DETECTED NOT DETECTED Final   Streptococcus pyogenes NOT DETECTED NOT DETECTED Final   Acinetobacter baumannii NOT DETECTED NOT DETECTED Final   Enterobacteriaceae species NOT DETECTED NOT DETECTED Final   Enterobacter cloacae complex NOT DETECTED NOT DETECTED Final   Escherichia coli NOT DETECTED NOT DETECTED Final   Klebsiella oxytoca NOT DETECTED NOT DETECTED Final   Klebsiella pneumoniae NOT DETECTED NOT DETECTED Final   Proteus species NOT DETECTED NOT DETECTED Final   Serratia marcescens NOT DETECTED NOT DETECTED Final   Haemophilus influenzae NOT DETECTED NOT DETECTED Final   Neisseria meningitidis NOT DETECTED NOT DETECTED Final   Pseudomonas aeruginosa NOT DETECTED NOT  DETECTED Final   Candida albicans NOT DETECTED NOT DETECTED Final   Candida glabrata NOT DETECTED NOT DETECTED Final   Candida krusei NOT DETECTED NOT DETECTED Final   Candida parapsilosis NOT DETECTED NOT DETECTED Final   Candida tropicalis NOT DETECTED NOT DETECTED Final    Comment: Performed at [facility], [place]. [address] [place], [place] [address]  Culture, blood (routine x 2)     Status: Abnormal (Preliminary result)   Collection Time: 04/14/19  2:20 PM   Specimen: BLOOD  Result Value Ref Range Status  Specimen Description BLOOD BLOOD LEFT FOREARM  Final   Special Requests   Final    BOTTLES DRAWN AEROBIC AND ANAEROBIC Blood Culture results may not be optimal due to an inadequate volume of blood received in culture bottles   Culture  Setup Time   Final    GRAM POSITIVE COCCI IN CLUSTERS IN BOTH AEROBIC AND ANAEROBIC BOTTLES CRITICAL VALUE NOTED.  VALUE IS CONSISTENT WITH PREVIOUSLY REPORTED AND CALLED VALUE. Performed at [facility], [place] [address] [place], [place] [address]    Culture STAPHYLOCOCCUS AUREUS (A)  Final   Report Status PENDING  Incomplete  SARS Coronavirus 2 (CEPHEID - Performed in [place] hospital lab), Hosp Order     Status: None   Collection Time: 04/14/19  2:29 PM   Specimen: Nasopharyngeal Swab  Result Value Ref Range Status   SARS Coronavirus 2 NEGATIVE NEGATIVE Final    Comment: (NOTE) If result is NEGATIVE SARS-CoV-2 target nucleic acids are NOT DETECTED. The SARS-CoV-2 RNA is generally detectable in upper and lower  respiratory specimens during the acute phase of infection. The lowest  concentration of SARS-CoV-2 viral copies this assay can detect is 250  copies / mL. A negative result does not preclude SARS-CoV-2 infection  and should not be used as the sole basis for treatment or other  patient management decisions.  A negative result may occur with  improper specimen collection / handling, submission of specimen other  than  nasopharyngeal swab, presence of viral mutation(s) within the  areas targeted by this assay, and inadequate number of viral copies  (<250 copies / mL). A negative result must be combined with clinical  observations, patient history, and epidemiological information. If result is POSITIVE SARS-CoV-2 target nucleic acids are DETECTED. The SARS-CoV-2 RNA is generally detectable in upper and lower  respiratory specimens dur ing the acute phase of infection.  Positive  results are indicative of active infection with SARS-CoV-2.  Clinical  correlation with patient history and other diagnostic information is  necessary to determine patient infection status.  Positive results do  not rule out bacterial infection or co-infection with other viruses. If result is PRESUMPTIVE POSTIVE SARS-CoV-2 nucleic acids MAY BE PRESENT.   A presumptive positive result was obtained on the submitted specimen  and confirmed on repeat testing.  While 2019 novel coronavirus  (SARS-CoV-2) nucleic acids may be present in the submitted sample  additional confirmatory testing may be necessary for epidemiological  and / or clinical management purposes  to differentiate between  SARS-CoV-2 and other Sarbecovirus currently known to infect humans.  If clinically indicated additional testing with an alternate test  methodology [phone]) is advised. The SARS-CoV-2 RNA is generally  detectable in upper and lower respiratory sp ecimens during the acute  phase of infection. The expected result is Negative. Fact Sheet for Patients:  StrictlyIdeas.no Fact Sheet for Healthcare Providers: BankingDealers.co.za This test is not yet approved or cleared by the [place] FDA and has been authorized for detection and/or diagnosis of SARS-CoV-2 by FDA under an Emergency Use Authorization (EUA).  This EUA will remain in effect (meaning this test can be used) for the duration of the  COVID-19 declaration under Section 564(b)(1) of the Act, 21 U.S.C. section 360bbb-3(b)(1), unless the authorization is terminated or revoked sooner. Performed at [facility], [place] [address] [place], [place] [address]   Aerobic/Anaerobic Culture (surgical/deep wound)     Status: None (Preliminary result)   Collection Time: 04/15/19  9:28 PM  Specimen: Wound  Result Value Ref Range Status   Specimen Description WOUND RIGHT FOREARM  Final   Special Requests PT ON VANC  Final   Gram Stain   Final    RARE WBC PRESENT, PREDOMINANTLY PMN FEW GRAM POSITIVE COCCI Performed at [facility], [place] [address] [place], [place] [address]    Culture PENDING  Incomplete   Report Status PENDING  Incomplete     Terri Piedra, NP [place] [facility] [place] [phone] Pager  04/16/2019  2:41 PM

## 2019-04-16 NOTE — Progress Notes (Signed)
   VASCULAR SURGERY ASSESSMENT & PLAN:   POD 1: Status post revision of right forearm AV graft.  His graft has a good thrill.  The graft should only be cannulated along the medial/ulnar aspect of the right forearm for 1 month.  ID: The graft itself did not appear to be infected.  There was hematoma around the damaged segment of graft which was removed.  An intraoperative culture was sent.  This had a few gram-positive cocci on Gram stain.  Culture is pending.  I have arranged follow-up in the office for removal of the sutures where the damaged segment of graft was removed.  SUBJECTIVE:   No complaints this morning.  PHYSICAL EXAM:   Vitals:   04/15/19 2215 04/15/19 2228 04/15/19 2230 04/16/19 0100  BP: (!) 142/76 113/78 113/78 (!) 120/55  Pulse: 89 87 88   Resp: 19 20 20    Temp:   (!) 97.3 F (36.3 C) 98 F (36.7 C)  TempSrc:    Oral  SpO2: 96% 95% 95% 95%  Weight:      Height:       His graft has a good thrill.   He has a palpable right radial pulse.   The incision where I am removed the damaged segment of graft and evacuated the hematoma looks fine without drainage.  LABS:   Lab Results  Component Value Date   WBC 8.2 04/15/2019   HGB 11.1 (L) 04/15/2019   HCT 32.3 (L) 04/15/2019   MCV 90.2 04/15/2019   PLT 90 (L) 04/15/2019   Lab Results  Component Value Date   CREATININE 11.70 (H) 04/15/2019   Lab Results  Component Value Date   INR 1.2 04/14/2019   CBG (last 3)  Recent Labs    04/14/19 1830 04/14/19 2158 04/15/19 1847  GLUCAP 169* 147* 107*    PROBLEM LIST:    Principal Problem:   MSSA bacteremia Active Problems:   Pneumonia   Hospital-acquired pneumonia   CURRENT MEDS:   . amLODipine  10 mg Oral QHS  . aspirin EC  81 mg Oral Daily  . ferric citrate  630 mg Oral TID WC  . heparin  5,000 Units Subcutaneous Q8H  . hydrALAZINE  100 mg Oral TID  . labetalol  300 mg Oral TID  . sevelamer carbonate  800 mg Oral TID WC  . sodium chloride  flush  3 mL Intravenous Q12H    [name] Beeper: [phone] Office: [phone] 04/16/2019

## 2019-04-16 NOTE — Progress Notes (Signed)
PROGRESS NOTE    Frank Fuller  XIP:[id number] DOB: [birth year] DOA: 04/14/2019 PCP: [facility]ists:   Brief Narrative:  As per he H&P "Frank Fuller  is a 58 y.o. male, with past medical history significant for ESRD, diabetes mellitus, CVA was presenting today with 2 days history of fever, shortness of breath and mild cough.  He reports also pleuritic chest pain.  The patient was scheduled for dialysis today however transferred to the emergency room due to the fever.  Patient also complains of diffuse abdominal pain but no diarrhea  . No recent exposure to COVID positive patient's Work-up in the emergency room right middle and lower lobe pneumonia.  His white blood cell count was 8.3 hemoglobin, platelets 92,000.  His potassium was 6.3 and troponin was 0.10.  Discussed with nephrology and the patient will have dialysis today after his COVID-19 was negative.  Patient started on IV antibiotics for treatment of hospital-acquired pneumonia".  04/15/2019: Patient was admitted with oxacillin sensitive Staphylococcus aureus bacteremia/sepsis.  Apparently, this occurred during dialysis.  Patient feels a lot better today.  No fever or chills.  Patient continues to have hiccups.  Will follow final cultures.  Nephrology input is appreciated.  Further management will depend on hospital course.  04/16/2019: Patient seen.  Reports pain around the lateral aspect of the right AV graft.  Input from the vascular surgical team is appreciated.  Work-up for possible infected AV graft is in progress.  Cultures have been sent.  Nephrology and infectious disease input is appreciated as well.  No fever overnight.  Assessment & Plan:   Principal Problem:   MSSA bacteremia Active Problems:   Pneumonia   Hospital-acquired pneumonia  Bacteremia/sepsis secondary to staph species: Follow final cultures Continue IV cefazolin. IV vancomycin/cefepime have been discontinued. Further management  will depend on hospital course. 04/16/2019: MSSA could be secondary to AV graft infection.  Vascular surgery and infectious disease team input is appreciated.  Continue IV antibiotics for now.  ESRD on hemodialysis: Hemodialysis team is managing.  Elevated troponin: Likely type II elevation. No chest pain.  Hypertension:  Continue to optimize.  History of hep C and CVA.   DVT prophylaxis: Subacute heparin Code Status: Full code Family Communication:  Disposition Plan: Home   Consultants:   Nephrology  Procedures:   Hemodialysis  Antimicrobials:   IV cefazolin  IV vancomycin discontinued  IV cefepime discontinued   Subjective: No fever or chills. Hiccups have resolved.   Objective: Vitals:   04/15/19 2228 04/15/19 2230 04/16/19 0100 04/16/19 0755  BP: 113/78 113/78 (!) 120/55 132/87  Pulse: 87 88  76  Resp: 20 20  19   Temp:  (!) 97.3 F (36.3 C) 98 F (36.7 C) 97.8 F (36.6 C)  TempSrc:   Oral   SpO2: 95% 95% 95% 94%  Weight:      Height:        Intake/Output Summary (Last 24 hours) at 04/16/2019 1221 Last data filed at 04/16/2019 0200 Gross per 24 hour  Intake 530 ml  Output 100 ml  Net 430 ml   Filed Weights   04/14/19 1400 04/15/19 0015 04/15/19 0255  Weight: 93 kg 88.9 kg 87.5 kg    Examination:  General exam: Appears calm and comfortable  Respiratory system: Clear to auscultation.  Cardiovascular system: S1 & S2.  Gastrointestinal system: Abdomen is nondistended, soft and nontender. No organomegaly or masses felt. Normal bowel sounds heard. Central nervous system: Alert and oriented.  Moves  all extremities.   Extremities: No leg edema.  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  Data Reviewed: I have personally reviewed following labs and imaging studies  CBC: Recent Labs  Lab 04/12/19 1100 04/14/19 1420 04/15/19 0840 04/16/19 0625  WBC 8.2 8.3 8.2 8.1  NEUTROABS 6.5 7.3  --  6.8  HGB 12.7* 12.2* 11.1* 10.6*  HCT  37.8* 35.6* 32.3* 30.9*  MCV 91.7 90.8 90.2 90.9  PLT 142* 92* 90* 96*   Basic Metabolic Panel: Recent Labs  Lab 04/12/19 1100 04/14/19 1420 04/14/19 2232 04/15/19 0840 04/16/19 0625  NA 137 131*  --  132* 129*  K 4.2 6.3*  --  4.7 5.9*  CL 95* 86*  --  91* 89*  CO2 28 21*  --  24 22  GLUCOSE 102* 113* 139* 111* 134*  BUN 25* 80*  --  47* 72*  CREATININE 7.68* 15.82*  --  11.70* 13.71*  CALCIUM 9.4 9.3  --  9.0 8.9  PHOS  --   --   --  5.8* 6.4*   GFR: Estimated Creatinine Clearance: 6.9 mL/min (A) (by C-G formula based on SCr of 13.71 mg/dL (H)). Liver Function Tests: Recent Labs  Lab 04/12/19 1100 04/14/19 1420 04/15/19 0840 04/16/19 0625  AST 19 27  --   --   ALT 8 13  --   --   ALKPHOS 70 75  --   --   BILITOT 0.7 0.6  --   --   PROT 7.9 7.3  --   --   ALBUMIN 3.9 3.4* 2.8* 2.9*   Recent Labs  Lab 04/12/19 1100  LIPASE 31   No results for input(s): AMMONIA in the last 168 hours. Coagulation Profile: Recent Labs  Lab 04/14/19 1420  INR 1.2   Cardiac Enzymes: Recent Labs  Lab 04/14/19 1420  TROPONINI 0.10*   BNP (last 3 results) No results for input(s): PROBNP in the last 8760 hours. HbA1C: No results for input(s): HGBA1C in the last 72 hours. CBG: Recent Labs  Lab 04/14/19 1707 04/14/19 1830 04/14/19 2158 04/15/19 1847  GLUCAP 103* 169* 147* 107*   Lipid Profile: No results for input(s): CHOL, HDL, LDLCALC, TRIG, CHOLHDL, LDLDIRECT in the last 72 hours. Thyroid Function Tests: No results for input(s): TSH, T4TOTAL, FREET4, T3FREE, THYROIDAB in the last 72 hours. Anemia Panel: No results for input(s): VITAMINB12, FOLATE, FERRITIN, TIBC, IRON, RETICCTPCT in the last 72 hours. Urine analysis:    Component Value Date/Time   COLORURINE YELLOW 08/04/2017 1824   APPEARANCEUR HAZY (A) 08/04/2017 1824   LABSPEC 1.009 08/04/2017 1824   PHURINE 9.0 (H) 08/04/2017 1824   GLUCOSEU 150 (A) 08/04/2017 1824   HGBUR NEGATIVE 08/04/2017 1824    BILIRUBINUR NEGATIVE 08/04/2017 1824   KETONESUR NEGATIVE 08/04/2017 1824   PROTEINUR >=300 (A) 08/04/2017 1824   NITRITE NEGATIVE 08/04/2017 1824   LEUKOCYTESUR NEGATIVE 08/04/2017 1824   Sepsis Labs: @LABRCNTIP (procalcitonin:4,lacticidven:4)  ) Recent Results (from the past 240 hour(s))  SARS Coronavirus 2 (CEPHEID- Performed in [facility] hospital lab), Hosp Order     Status: None   Collection Time: 04/12/19 10:50 AM   Specimen: Nasopharyngeal Swab  Result Value Ref Range Status   SARS Coronavirus 2 NEGATIVE NEGATIVE Final    Comment: (NOTE) If result is NEGATIVE SARS-CoV-2 target nucleic acids are NOT DETECTED. The SARS-CoV-2 RNA is generally detectable in upper and lower  respiratory specimens during the acute phase of infection. The lowest  concentration of SARS-CoV-2 viral  copies this assay can detect is 250  copies / mL. A negative result does not preclude SARS-CoV-2 infection  and should not be used as the sole basis for treatment or other  patient management decisions.  A negative result may occur with  improper specimen collection / handling, submission of specimen other  than nasopharyngeal swab, presence of viral mutation(s) within the  areas targeted by this assay, and inadequate number of viral copies  (<250 copies / mL). A negative result must be combined with clinical  observations, patient history, and epidemiological information. If result is POSITIVE SARS-CoV-2 target nucleic acids are DETECTED. The SARS-CoV-2 RNA is generally detectable in upper and lower  respiratory specimens dur ing the acute phase of infection.  Positive  results are indicative of active infection with SARS-CoV-2.  Clinical  correlation with patient history and other diagnostic information is  necessary to determine patient infection status.  Positive results do  not rule out bacterial infection or co-infection with other viruses. If result is PRESUMPTIVE POSTIVE SARS-CoV-2 nucleic  acids MAY BE PRESENT.   A presumptive positive result was obtained on the submitted specimen  and confirmed on repeat testing.  While 2019 novel coronavirus  (SARS-CoV-2) nucleic acids may be present in the submitted sample  additional confirmatory testing may be necessary for epidemiological  and / or clinical management purposes  to differentiate between  SARS-CoV-2 and other Sarbecovirus currently known to infect humans.  If clinically indicated additional testing with an alternate test  methodology [phone]) is advised. The SARS-CoV-2 RNA is generally  detectable in upper and lower respiratory sp ecimens during the acute  phase of infection. The expected result is Negative. Fact Sheet for Patients:  StrictlyIdeas.no Fact Sheet for Healthcare Providers: BankingDealers.co.za This test is not yet approved or cleared by the [place] FDA and has been authorized for detection and/or diagnosis of SARS-CoV-2 by FDA under an Emergency Use Authorization (EUA).  This EUA will remain in effect (meaning this test can be used) for the duration of the COVID-19 declaration under Section 564(b)(1) of the Act, 21 U.S.C. section 360bbb-3(b)(1), unless the authorization is terminated or revoked sooner. Performed at [facility], [place] [address] [place], [place] [address]   Culture, blood (routine x 2)     Status: Abnormal (Preliminary result)   Collection Time: 04/14/19  2:10 PM   Specimen: BLOOD  Result Value Ref Range Status   Specimen Description BLOOD LEFT WRIST  Final   Special Requests   Final    BOTTLES DRAWN AEROBIC AND ANAEROBIC Blood Culture adequate volume   Culture  Setup Time   Final    IN BOTH AEROBIC AND ANAEROBIC BOTTLES GRAM POSITIVE COCCI CRITICAL RESULT CALLED TO, READ BACK BY AND VERIFIED WITH: G. ABBOTT,PHARMD 0301 04/15/2019 T. TYSOR    Culture (A)  Final    STAPHYLOCOCCUS AUREUS SUSCEPTIBILITIES TO  FOLLOW Performed at [facility], [place] [address], [place], [place] [address]    Report Status PENDING  Incomplete  Blood Culture ID Panel (Reflexed)     Status: Abnormal   Collection Time: 04/14/19  2:10 PM  Result Value Ref Range Status   Enterococcus species NOT DETECTED NOT DETECTED Final   Listeria monocytogenes NOT DETECTED NOT DETECTED Final   Staphylococcus species DETECTED (A) NOT DETECTED Final    Comment: CRITICAL RESULT CALLED TO, READ BACK BY AND VERIFIED WITH: G. ABBOTT,PHARMD 0301 04/15/2019 T. TYSOR    Staphylococcus aureus (BCID) DETECTED (A) NOT DETECTED  Final    Comment: Methicillin (oxacillin) susceptible Staphylococcus aureus (MSSA). Preferred therapy is anti staphylococcal beta lactam antibiotic (Cefazolin or Nafcillin), unless clinically contraindicated. CRITICAL RESULT CALLED TO, READ BACK BY AND VERIFIED WITH: G. ABBOTT,PHARMD 0301 04/15/2019 T. TYSOR    Methicillin resistance NOT DETECTED NOT DETECTED Final   Streptococcus species NOT DETECTED NOT DETECTED Final   Streptococcus agalactiae NOT DETECTED NOT DETECTED Final   Streptococcus pneumoniae NOT DETECTED NOT DETECTED Final   Streptococcus pyogenes NOT DETECTED NOT DETECTED Final   Acinetobacter baumannii NOT DETECTED NOT DETECTED Final   Enterobacteriaceae species NOT DETECTED NOT DETECTED Final   Enterobacter cloacae complex NOT DETECTED NOT DETECTED Final   Escherichia coli NOT DETECTED NOT DETECTED Final   Klebsiella oxytoca NOT DETECTED NOT DETECTED Final   Klebsiella pneumoniae NOT DETECTED NOT DETECTED Final   Proteus species NOT DETECTED NOT DETECTED Final   Serratia marcescens NOT DETECTED NOT DETECTED Final   Haemophilus influenzae NOT DETECTED NOT DETECTED Final   Neisseria meningitidis NOT DETECTED NOT DETECTED Final   Pseudomonas aeruginosa NOT DETECTED NOT DETECTED Final   Candida albicans NOT DETECTED NOT DETECTED Final   Candida glabrata NOT DETECTED NOT DETECTED Final   Candida  krusei NOT DETECTED NOT DETECTED Final   Candida parapsilosis NOT DETECTED NOT DETECTED Final   Candida tropicalis NOT DETECTED NOT DETECTED Final    Comment: Performed at [facility], [place]. [address], [place], [place] [address]  Culture, blood (routine x 2)     Status: Abnormal (Preliminary result)   Collection Time: 04/14/19  2:20 PM   Specimen: BLOOD  Result Value Ref Range Status   Specimen Description BLOOD BLOOD LEFT FOREARM  Final   Special Requests   Final    BOTTLES DRAWN AEROBIC AND ANAEROBIC Blood Culture results may not be optimal due to an inadequate volume of blood received in culture bottles   Culture  Setup Time   Final    GRAM POSITIVE COCCI IN CLUSTERS IN BOTH AEROBIC AND ANAEROBIC BOTTLES CRITICAL VALUE NOTED.  VALUE IS CONSISTENT WITH PREVIOUSLY REPORTED AND CALLED VALUE. Performed at [facility], [place] [address], [place], [place] [address]    Culture STAPHYLOCOCCUS AUREUS (A)  Final   Report Status PENDING  Incomplete  SARS Coronavirus 2 (CEPHEID - Performed in [facility] hospital lab), Hosp Order     Status: None   Collection Time: 04/14/19  2:29 PM   Specimen: Nasopharyngeal Swab  Result Value Ref Range Status   SARS Coronavirus 2 NEGATIVE NEGATIVE Final    Comment: (NOTE) If result is NEGATIVE SARS-CoV-2 target nucleic acids are NOT DETECTED. The SARS-CoV-2 RNA is generally detectable in upper and lower  respiratory specimens during the acute phase of infection. The lowest  concentration of SARS-CoV-2 viral copies this assay can detect is 250  copies / mL. A negative result does not preclude SARS-CoV-2 infection  and should not be used as the sole basis for treatment or other  patient management decisions.  A negative result may occur with  improper specimen collection / handling, submission of specimen other  than nasopharyngeal swab, presence of viral mutation(s) within the  areas targeted by this assay, and inadequate number of viral copies   (<250 copies / mL). A negative result must be combined with clinical  observations, patient history, and epidemiological information. If result is POSITIVE SARS-CoV-2 target nucleic acids are DETECTED. The SARS-CoV-2 RNA is generally detectable in upper and lower  respiratory specimens dur  ing the acute phase of infection.  Positive  results are indicative of active infection with SARS-CoV-2.  Clinical  correlation with patient history and other diagnostic information is  necessary to determine patient infection status.  Positive results do  not rule out bacterial infection or co-infection with other viruses. If result is PRESUMPTIVE POSTIVE SARS-CoV-2 nucleic acids MAY BE PRESENT.   A presumptive positive result was obtained on the submitted specimen  and confirmed on repeat testing.  While 2019 novel coronavirus  (SARS-CoV-2) nucleic acids may be present in the submitted sample  additional confirmatory testing may be necessary for epidemiological  and / or clinical management purposes  to differentiate between  SARS-CoV-2 and other Sarbecovirus currently known to infect humans.  If clinically indicated additional testing with an alternate test  methodology [phone]) is advised. The SARS-CoV-2 RNA is generally  detectable in upper and lower respiratory sp ecimens during the acute  phase of infection. The expected result is Negative. Fact Sheet for Patients:  StrictlyIdeas.no Fact Sheet for Healthcare Providers: BankingDealers.co.za This test is not yet approved or cleared by the [place] FDA and has been authorized for detection and/or diagnosis of SARS-CoV-2 by FDA under an Emergency Use Authorization (EUA).  This EUA will remain in effect (meaning this test can be used) for the duration of the COVID-19 declaration under Section 564(b)(1) of the Act, 21 U.S.C. section 360bbb-3(b)(1), unless the authorization is terminated  or revoked sooner. Performed at [facility], [place] [address], [place], [place] [address]   Aerobic/Anaerobic Culture (surgical/deep wound)     Status: None (Preliminary result)   Collection Time: 04/15/19  9:28 PM   Specimen: Wound  Result Value Ref Range Status   Specimen Description WOUND RIGHT FOREARM  Final   Special Requests PT ON VANC  Final   Gram Stain   Final    RARE WBC PRESENT, PREDOMINANTLY PMN FEW GRAM POSITIVE COCCI Performed at [facility], [place] [address], [place],  [address]    Culture PENDING  Incomplete   Report Status PENDING  Incomplete         Radiology Studies: Ct Abdomen Pelvis Wo Contrast  Result Date: 04/14/2019 CLINICAL DATA:  Abdominal pain, fever. Suspected abscess. EXAM: CT CHEST, ABDOMEN AND PELVIS WITHOUT CONTRAST TECHNIQUE: Multidetector CT imaging of the chest, abdomen and pelvis was performed following the standard protocol without IV contrast. COMPARISON:  04/12/2019 FINDINGS: CT CHEST FINDINGS Cardiovascular: Enlarged heart. Small pericardial effusion measuring 7 mm in maximum thickness. Calcific atherosclerotic disease of the coronary arteries and aorta. Mediastinum/Nodes: No enlarged mediastinal, hilar, or axillary lymph nodes. Thyroid gland, and trachea demonstrate no significant findings. Diffuse mild dilation of the esophagus. Lungs/Pleura: Scattered ground-glass opacities in the right lower lobe and right middle lobe, with minimal ground-glass opacification of the rest of the lung parenchyma. Musculoskeletal: No chest wall mass or suspicious bone lesions identified. CT ABDOMEN PELVIS FINDINGS Hepatobiliary: Normal appearance of the liver. Vicarious excretion of contrast in the gallbladder, likely residual from the CT scan dated 04/12/2019 Pancreas: Unremarkable. No pancreatic ductal dilatation or surrounding inflammatory changes. Spleen: Normal in size without focal abnormality. Adrenals/Urinary Tract: Numerous hypoattenuated  lesions throughout both kidneys, incompletely evaluated due to lack of IV contrast. The largest 2.9 cm mass in the superior pole of the right kidney does not satisfy the criteria for simple cyst. Atrophic appearance of the kidneys. The minimally distended urinary bladder contains excreted contrast. Stomach/Bowel: Stomach is within normal limits. Appendix appears normal. No  evidence of bowel wall thickening, distention, or inflammatory changes. Vascular/Lymphatic: Calcific atherosclerotic disease and tortuosity of the aorta. Again seen is a fusiform abdominal aortic aneurysm of the infrarenal abdominal aorta with maximum AP measurement of 3.5 cm. Reproductive: Prostate is unremarkable. Other: No abdominal wall hernia or abnormality. No abdominopelvic ascites. Musculoskeletal: Mild multilevel spondylosis of the spine. Advanced spondylosis at L4-L5 and L5-S1. IMPRESSION: 1. Interval development of confluent ground-glass airspace opacifications in the right lower and right middle lobes, with minimal ground-glass opacification of the rest of the lung parenchyma. Findings may represent asymmetric edema, however atypical/viral pneumonia, including COVID pneumonia is also of consideration. 2. Numerous hypoattenuated lesions throughout both kidneys, incompletely evaluated due to lack of IV contrast. The largest 2.9 cm mass in the superior pole of the right kidney does not satisfy the criteria for a simple cyst. It may represent a hemorrhagic cyst, or a solid renal mass. 3. Enlarged heart with small pericardial effusion. 4. Calcific atherosclerotic disease of the coronary arteries and aorta. 5. Diffuse dilation of the esophagus with circumferential mucosal thickening of the distal esophagus, suggestive of esophagitis/dysmotility. 6. Fusiform infrarenal abdominal aortic aneurysm with maximum AP measurement of 3.5 cm. Recommend followup by ultrasound in 2 years. This recommendation follows ACR consensus guidelines: White Paper  of the ACR Incidental Findings Committee II on Vascular Findings. J Am Coll Radiol 2013; 10:789-794. Electronically Signed   By: Fidela Salisbury M.D.   On: 04/14/2019 15:38   Ct Chest Wo Contrast  Result Date: 04/14/2019 CLINICAL DATA:  Abdominal pain, fever. Suspected abscess. EXAM: CT CHEST, ABDOMEN AND PELVIS WITHOUT CONTRAST TECHNIQUE: Multidetector CT imaging of the chest, abdomen and pelvis was performed following the standard protocol without IV contrast. COMPARISON:  04/12/2019 FINDINGS: CT CHEST FINDINGS Cardiovascular: Enlarged heart. Small pericardial effusion measuring 7 mm in maximum thickness. Calcific atherosclerotic disease of the coronary arteries and aorta. Mediastinum/Nodes: No enlarged mediastinal, hilar, or axillary lymph nodes. Thyroid gland, and trachea demonstrate no significant findings. Diffuse mild dilation of the esophagus. Lungs/Pleura: Scattered ground-glass opacities in the right lower lobe and right middle lobe, with minimal ground-glass opacification of the rest of the lung parenchyma. Musculoskeletal: No chest wall mass or suspicious bone lesions identified. CT ABDOMEN PELVIS FINDINGS Hepatobiliary: Normal appearance of the liver. Vicarious excretion of contrast in the gallbladder, likely residual from the CT scan dated 04/12/2019 Pancreas: Unremarkable. No pancreatic ductal dilatation or surrounding inflammatory changes. Spleen: Normal in size without focal abnormality. Adrenals/Urinary Tract: Numerous hypoattenuated lesions throughout both kidneys, incompletely evaluated due to lack of IV contrast. The largest 2.9 cm mass in the superior pole of the right kidney does not satisfy the criteria for simple cyst. Atrophic appearance of the kidneys. The minimally distended urinary bladder contains excreted contrast. Stomach/Bowel: Stomach is within normal limits. Appendix appears normal. No evidence of bowel wall thickening, distention, or inflammatory changes.  Vascular/Lymphatic: Calcific atherosclerotic disease and tortuosity of the aorta. Again seen is a fusiform abdominal aortic aneurysm of the infrarenal abdominal aorta with maximum AP measurement of 3.5 cm. Reproductive: Prostate is unremarkable. Other: No abdominal wall hernia or abnormality. No abdominopelvic ascites. Musculoskeletal: Mild multilevel spondylosis of the spine. Advanced spondylosis at L4-L5 and L5-S1. IMPRESSION: 1. Interval development of confluent ground-glass airspace opacifications in the right lower and right middle lobes, with minimal ground-glass opacification of the rest of the lung parenchyma. Findings may represent asymmetric edema, however atypical/viral pneumonia, including COVID pneumonia is also of consideration. 2. Numerous hypoattenuated lesions throughout both kidneys, incompletely evaluated due  to lack of IV contrast. The largest 2.9 cm mass in the superior pole of the right kidney does not satisfy the criteria for a simple cyst. It may represent a hemorrhagic cyst, or a solid renal mass. 3. Enlarged heart with small pericardial effusion. 4. Calcific atherosclerotic disease of the coronary arteries and aorta. 5. Diffuse dilation of the esophagus with circumferential mucosal thickening of the distal esophagus, suggestive of esophagitis/dysmotility. 6. Fusiform infrarenal abdominal aortic aneurysm with maximum AP measurement of 3.5 cm. Recommend followup by ultrasound in 2 years. This recommendation follows ACR consensus guidelines: White Paper of the ACR Incidental Findings Committee II on Vascular Findings. J Am Coll Radiol 2013; 10:789-794. Electronically Signed   By: Fidela Salisbury M.D.   On: 04/14/2019 15:38   Dg Chest Port 1 View  Result Date: 04/14/2019 CLINICAL DATA:  Fever. EXAM: PORTABLE CHEST 1 VIEW COMPARISON:  Radiograph of April 12, 2019. FINDINGS: Stable cardiomegaly. Atherosclerosis of thoracic aorta is noted. No pneumothorax or pleural effusion is noted. Left  lung is clear. Mild right basilar atelectasis or infiltrate is noted. Bony thorax unremarkable. IMPRESSION: Mild right basilar atelectasis or infiltrate is noted. Aortic Atherosclerosis (ICD10-I70.0). Electronically Signed   By: Marijo Conception M.D.   On: 04/14/2019 14:34        Scheduled Meds:  amLODipine  10 mg Oral QHS   aspirin EC  81 mg Oral Daily   ferric citrate  630 mg Oral TID WC   heparin  5,000 Units Subcutaneous Q8H   hydrALAZINE  100 mg Oral TID   labetalol  300 mg Oral TID   sevelamer carbonate  800 mg Oral TID WC   sodium chloride flush  3 mL Intravenous Q12H   sodium zirconium cyclosilicate  10 g Oral Daily   Continuous Infusions:  sodium chloride Stopped (04/15/19 2215)   sodium chloride Stopped (04/15/19 2215)    ceFAZolin (ANCEF) IV       LOS: 2 days    Time spent: 35 Minutes.    Dana Allan, MD  [facility] Pager #: [phone] 7PM-7AM contact night coverage as above

## 2019-04-16 NOTE — Progress Notes (Signed)
[facility] Progress Note  Subjective: went to OR for revision AVG (excision of infected area and replaced w/ new segment).  No c/o today  Vitals:   04/15/19 2228 04/15/19 2230 04/16/19 0100 04/16/19 0755  BP: 113/78 113/78 (!) 120/55 132/87  Pulse: 87 88  76  Resp: 20 20  19   Temp:  (!) 97.3 F (36.3 C) 98 F (36.7 C) 97.8 F (36.6 C)  TempSrc:   Oral   SpO2: 95% 95% 95% 94%  Weight:      Height:        Inpatient medications: . amLODipine  10 mg Oral QHS  . aspirin EC  81 mg Oral Daily  . ferric citrate  630 mg Oral TID WC  . heparin  5,000 Units Subcutaneous Q8H  . hydrALAZINE  100 mg Oral TID  . labetalol  300 mg Oral TID  . sevelamer carbonate  800 mg Oral TID WC  . sodium chloride flush  3 mL Intravenous Q12H   . sodium chloride Stopped (04/15/19 2215)  . sodium chloride Stopped (04/15/19 2215)  .  ceFAZolin (ANCEF) IV     sodium chloride, acetaminophen, albuterol, ferric citrate, HYDROcodone-acetaminophen, ondansetron **OR** ondansetron (ZOFRAN) IV, sodium chloride flush    Exam: GEN: No acute distress, appears unwell, still ENT: NCAT EYES: EOMI CV: RRR, normal S1/S2, no murmur gallop or rub PULM: Clear bilaterally, normal work of breathing ABD: Soft, mild tenderness to palpation throughout, no rebound or guarding, quiet bowel sounds SKIN: No rashes or lesions VASCULAR: Right forearm AV graft with normal bruit and thrill, no identified ulcer/scab/bruising, dressing was not removed EXT: No peripheral edema   Outpt HD: [facility]  4h 62min  86kg   2/2.25 bath  450/1.5  P2  Hep 3000   RFA AVG  - mircera 50 ug last 6/2, is on hold currently  - calcitriol 1.5 ug tiw      Assessment/ Plan: 1. Hyperkalemia - K+ still up 5.9.  Lokelma x 1 and HD tomorrow.  2. Staph bacteremia/ spontaneous AVG bleed: infected segment removed at surg yest 6/23 w/ revision of new segment of AVG.  OR fluid gram stain + GPC.   3. ESRD - HD TTS. HD tomorrow 4. HTN - on  3 bp meds at home 5. Hep C 6. H/o CVA 7. H/o depression 8. Anemia ckd - not on esa now at op center    [facility] 04/16/2019, 2:00 PM  Iron/TIBC/Ferritin/ %Sat    Component Value Date/Time   IRON 50 08/03/2017 1028   TIBC 260 08/03/2017 1028   FERRITIN 305 08/03/2017 1028   IRONPCTSAT 19 08/03/2017 1028   Recent Labs  Lab 04/14/19 1420  04/16/19 0625  NA 131*   < > 129*  K 6.3*   < > 5.9*  CL 86*   < > 89*  CO2 21*   < > 22  GLUCOSE 113*   < > 134*  BUN 80*   < > 72*  CREATININE 15.82*   < > 13.71*  CALCIUM 9.3   < > 8.9  PHOS  --    < > 6.4*  ALBUMIN 3.4*   < > 2.9*  INR 1.2  --   --    < > = values in this interval not displayed.   Recent Labs  Lab 04/14/19 1420  AST 27  ALT 13  ALKPHOS 75  BILITOT 0.6  PROT 7.3   Recent Labs  Lab  04/16/19 0625  WBC 8.1  HGB 10.6*  HCT 30.9*  PLT 96*

## 2019-04-17 LAB — RENAL FUNCTION PANEL
Albumin: 2.7 g/dL — ABNORMAL LOW (ref 3.5–5.0)
Anion gap: 20 — ABNORMAL HIGH (ref 5–15)
BUN: 108 mg/dL — ABNORMAL HIGH (ref 6–20)
CO2: 20 mmol/L — ABNORMAL LOW (ref 22–32)
Calcium: 8.9 mg/dL (ref 8.9–10.3)
Chloride: 87 mmol/L — ABNORMAL LOW (ref 98–111)
Creatinine, Ser: 14.95 mg/dL — ABNORMAL HIGH (ref 0.61–1.24)
GFR calc Af Amer: 4 mL/min — ABNORMAL LOW (ref >60)
GFR calc non Af Amer: 3 mL/min — ABNORMAL LOW (ref >60)
Glucose, Bld: 109 mg/dL — ABNORMAL HIGH (ref 70–99)
Phosphorus: 4.8 mg/dL — ABNORMAL HIGH (ref 2.5–4.6)
Potassium: 4.8 mmol/L (ref 3.5–5.1)
Sodium: 127 mmol/L — ABNORMAL LOW (ref 135–145)

## 2019-04-17 LAB — CBC
HCT: 28.3 % — ABNORMAL LOW (ref 39.0–52.0)
Hemoglobin: 9.9 g/dL — ABNORMAL LOW (ref 13.0–17.0)
MCH: 31.4 pg (ref 26.0–34.0)
MCHC: 35 g/dL (ref 30.0–36.0)
MCV: 89.8 fL (ref 80.0–100.0)
Platelets: 109 10*3/uL — ABNORMAL LOW (ref 150–400)
RBC: 3.15 MIL/uL — ABNORMAL LOW (ref 4.22–5.81)
RDW: 16.9 % — ABNORMAL HIGH (ref 11.5–15.5)
WBC: 9.1 10*3/uL (ref 4.0–10.5)
nRBC: 0 % (ref 0.0–0.2)

## 2019-04-17 LAB — CULTURE, BLOOD (ROUTINE X 2): Special Requests: ADEQUATE

## 2019-04-17 MED ORDER — HEPARIN SODIUM (PORCINE) 1000 UNIT/ML DIALYSIS: 2000.0000 [IU] | Freq: Once | INTRAMUSCULAR | Status: DC

## 2019-04-17 NOTE — Progress Notes (Signed)
[facility]  Date of Admission:  04/14/2019     Total days of antibiotics 4        ASSESSMENT/PLAN  Frank Fuller is a 58 year old male admitted with fever, abdominal pain, and nausea with concern for pneumonia on CT scan and found to have MSSA bacteremia with infection of his dialysis graft located in his right forearm.  Antimicrobial therapy narrowed to Ancef with identification of MSSA.  Now status post revision of the graft by replacing venous type of graft with interposition 7 mm PTFE.  Atrial operative specimens positive for MSSA.  MSSA bacteremia -repeat blood cultures with no growth following 1 day.  Most likely source was infected dialysis graft which has since been replaced.  Awaiting TEE to rule out endocarditis.  Continue current dose of Ancef.  Infected dialysis graft status post replacement - postop day 2 with some mild swelling.  Appears to be healing well.  Operative cultures confirming MSSA. Continue wound care per vascular surgery.   Principal Problem:   MSSA bacteremia Active Problems:   Pneumonia   Hospital-acquired pneumonia   . amLODipine  10 mg Oral QHS  . aspirin EC  81 mg Oral Daily  . Chlorhexidine Gluconate Cloth  6 each Topical Q0600  . ferric citrate  630 mg Oral TID WC  . heparin  5,000 Units Subcutaneous Q8H  . hydrALAZINE  100 mg Oral TID  . labetalol  300 mg Oral TID  . sevelamer carbonate  800 mg Oral TID WC  . sodium chloride flush  3 mL Intravenous Q12H    SUBJECTIVE:  Afebrile overnight.  Continues to have swelling and pain in the right forearm.   Allergies  Allergen Reactions  . Chlorhexidine Other (See Comments)    Unknown reaction Patch skin test done at dialysis 06/26/17  - staff using clear dressing and alcohol to clean exit site of catheter  . Clonidine Derivatives Other (See Comments)    unresponsive  . Lisinopril Other (See Comments)    unresponsive  . Carvedilol Rash     Review of Systems:  Review of Systems  Constitutional: Negative for chills, fever and weight loss.  Respiratory: Negative for cough, shortness of breath and wheezing.   Cardiovascular: Negative for chest pain and leg swelling.  Gastrointestinal: Negative for abdominal pain, constipation, diarrhea, nausea and vomiting.  Musculoskeletal:       Positive for right arm pain  Skin: Negative for rash.      OBJECTIVE: Vitals:   04/17/19 1030 04/17/19 1040 04/17/19 1202 04/17/19 1610  BP: (!) 146/88 (!) 166/93 (!) 155/88 (!) 148/81  Pulse: 69 68 75 70  Resp:  17 18 (!) 24  Temp:  98.5 F (36.9 C) 97.8 F (36.6 C) 98 F (36.7 C)  TempSrc:  Oral Oral Oral  SpO2:  97% 94% 94%  Weight:      Height:       Body mass index is 24.88 kg/m.  Physical Exam Constitutional:      General: He is not in acute distress.    Appearance: He is well-developed.     Comments: Lying in bed with head of bed elevated; pleasant.   Cardiovascular:     Rate and Rhythm: Normal rate and regular rhythm.     Heart sounds: Normal heart sounds.  Pulmonary:     Effort: Pulmonary effort is normal.     Breath sounds: Normal breath sounds.  Skin:    General: Skin is warm  and dry.     Comments: Surgical incision appears well approximated with dermabond present. No evidence of infection.   Neurological:     Mental Status: He is alert and oriented to person, place, and time.  Psychiatric:        Behavior: Behavior normal.        Thought Content: Thought content normal.        Judgment: Judgment normal.     Lab Results Lab Results  Component Value Date   WBC 9.1 04/17/2019   HGB 9.9 (L) 04/17/2019   HCT 28.3 (L) 04/17/2019   MCV 89.8 04/17/2019   PLT 109 (L) 04/17/2019    Lab Results  Component Value Date   CREATININE 14.95 (H) 04/17/2019   BUN 108 (H) 04/17/2019   NA 127 (L) 04/17/2019   K 4.8 04/17/2019   CL 87 (L) 04/17/2019   CO2 20 (L) 04/17/2019    Lab Results  Component Value Date   ALT 13 04/14/2019   AST  27 04/14/2019   ALKPHOS 75 04/14/2019   BILITOT 0.6 04/14/2019     Microbiology: Recent Results (from the past 240 hour(s))  SARS Coronavirus 2 (CEPHEID- Performed in [facility] hospital lab), Hosp Order     Status: None   Collection Time: 04/12/19 10:50 AM   Specimen: Nasopharyngeal Swab  Result Value Ref Range Status   SARS Coronavirus 2 NEGATIVE NEGATIVE Final    Comment: (NOTE) If result is NEGATIVE SARS-CoV-2 target nucleic acids are NOT DETECTED. The SARS-CoV-2 RNA is generally detectable in upper and lower  respiratory specimens during the acute phase of infection. The lowest  concentration of SARS-CoV-2 viral copies this assay can detect is 250  copies / mL. A negative result does not preclude SARS-CoV-2 infection  and should not be used as the sole basis for treatment or other  patient management decisions.  A negative result may occur with  improper specimen collection / handling, submission of specimen other  than nasopharyngeal swab, presence of viral mutation(s) within the  areas targeted by this assay, and inadequate number of viral copies  (<250 copies / mL). A negative result must be combined with clinical  observations, patient history, and epidemiological information. If result is POSITIVE SARS-CoV-2 target nucleic acids are DETECTED. The SARS-CoV-2 RNA is generally detectable in upper and lower  respiratory specimens dur ing the acute phase of infection.  Positive  results are indicative of active infection with SARS-CoV-2.  Clinical  correlation with patient history and other diagnostic information is  necessary to determine patient infection status.  Positive results do  not rule out bacterial infection or co-infection with other viruses. If result is PRESUMPTIVE POSTIVE SARS-CoV-2 nucleic acids MAY BE PRESENT.   A presumptive positive result was obtained on the submitted specimen  and confirmed on repeat testing.  While 2019 novel coronavirus   (SARS-CoV-2) nucleic acids may be present in the submitted sample  additional confirmatory testing may be necessary for epidemiological  and / or clinical management purposes  to differentiate between  SARS-CoV-2 and other Sarbecovirus currently known to infect humans.  If clinically indicated additional testing with an alternate test  methodology [phone]) is advised. The SARS-CoV-2 RNA is generally  detectable in upper and lower respiratory sp ecimens during the acute  phase of infection. The expected result is Negative. Fact Sheet for Patients:  StrictlyIdeas.no Fact Sheet for Healthcare Providers: BankingDealers.co.za This test is not yet approved or cleared by the [place] FDA and has  been authorized for detection and/or diagnosis of SARS-CoV-2 by FDA under an Emergency Use Authorization (EUA).  This EUA will remain in effect (meaning this test can be used) for the duration of the COVID-19 declaration under Section 564(b)(1) of the Act, 21 U.S.C. section 360bbb-3(b)(1), unless the authorization is terminated or revoked sooner. Performed at [facility], [place] [address] [place], [place] [address]   Culture, blood (routine x 2)     Status: Abnormal   Collection Time: 04/14/19  2:10 PM   Specimen: BLOOD  Result Value Ref Range Status   Specimen Description BLOOD LEFT WRIST  Final   Special Requests   Final    BOTTLES DRAWN AEROBIC AND ANAEROBIC Blood Culture adequate volume   Culture  Setup Time   Final    IN BOTH AEROBIC AND ANAEROBIC BOTTLES GRAM POSITIVE COCCI CRITICAL RESULT CALLED TO, READ BACK BY AND VERIFIED WITH: G. ABBOTT,PHARMD 0301 04/15/2019 Mena Goes Performed at [facility], [place] [address] [place], [place] [address]    Culture STAPHYLOCOCCUS AUREUS (A)  Final   Report Status 04/17/2019 FINAL  Final   Organism ID, Bacteria STAPHYLOCOCCUS AUREUS  Final      Susceptibility   Staphylococcus aureus -  MIC*    CIPROFLOXACIN <=0.5 SENSITIVE Sensitive     ERYTHROMYCIN <=0.25 SENSITIVE Sensitive     GENTAMICIN <=0.5 SENSITIVE Sensitive     OXACILLIN <=0.25 SENSITIVE Sensitive     TETRACYCLINE <=1 SENSITIVE Sensitive     VANCOMYCIN 1 SENSITIVE Sensitive     TRIMETH/SULFA <=10 SENSITIVE Sensitive     CLINDAMYCIN <=0.25 SENSITIVE Sensitive     RIFAMPIN <=0.5 SENSITIVE Sensitive     Inducible Clindamycin NEGATIVE Sensitive     * STAPHYLOCOCCUS AUREUS  Blood Culture ID Panel (Reflexed)     Status: Abnormal   Collection Time: 04/14/19  2:10 PM  Result Value Ref Range Status   Enterococcus species NOT DETECTED NOT DETECTED Final   Listeria monocytogenes NOT DETECTED NOT DETECTED Final   Staphylococcus species DETECTED (A) NOT DETECTED Final    Comment: CRITICAL RESULT CALLED TO, READ BACK BY AND VERIFIED WITH: G. ABBOTT,PHARMD 0301 04/15/2019 T. TYSOR    Staphylococcus aureus (BCID) DETECTED (A) NOT DETECTED Final    Comment: Methicillin (oxacillin) susceptible Staphylococcus aureus (MSSA). Preferred therapy is anti staphylococcal beta lactam antibiotic (Cefazolin or Nafcillin), unless clinically contraindicated. CRITICAL RESULT CALLED TO, READ BACK BY AND VERIFIED WITH: G. ABBOTT,PHARMD 0301 04/15/2019 T. TYSOR    Methicillin resistance NOT DETECTED NOT DETECTED Final   Streptococcus species NOT DETECTED NOT DETECTED Final   Streptococcus agalactiae NOT DETECTED NOT DETECTED Final   Streptococcus pneumoniae NOT DETECTED NOT DETECTED Final   Streptococcus pyogenes NOT DETECTED NOT DETECTED Final   Acinetobacter baumannii NOT DETECTED NOT DETECTED Final   Enterobacteriaceae species NOT DETECTED NOT DETECTED Final   Enterobacter cloacae complex NOT DETECTED NOT DETECTED Final   Escherichia coli NOT DETECTED NOT DETECTED Final   Klebsiella oxytoca NOT DETECTED NOT DETECTED Final   Klebsiella pneumoniae NOT DETECTED NOT DETECTED Final   Proteus species NOT DETECTED NOT DETECTED Final    Serratia marcescens NOT DETECTED NOT DETECTED Final   Haemophilus influenzae NOT DETECTED NOT DETECTED Final   Neisseria meningitidis NOT DETECTED NOT DETECTED Final   Pseudomonas aeruginosa NOT DETECTED NOT DETECTED Final   Candida albicans NOT DETECTED NOT DETECTED Final   Candida glabrata NOT DETECTED NOT DETECTED Final   Candida krusei NOT DETECTED NOT DETECTED Final  Candida parapsilosis NOT DETECTED NOT DETECTED Final   Candida tropicalis NOT DETECTED NOT DETECTED Final    Comment: Performed at [facility], [place] [address] [place], [place] [address]  Culture, blood (routine x 2)     Status: Abnormal   Collection Time: 04/14/19  2:20 PM   Specimen: BLOOD  Result Value Ref Range Status   Specimen Description BLOOD BLOOD LEFT FOREARM  Final   Special Requests   Final    BOTTLES DRAWN AEROBIC AND ANAEROBIC Blood Culture results may not be optimal due to an inadequate volume of blood received in culture bottles   Culture  Setup Time   Final    GRAM POSITIVE COCCI IN CLUSTERS IN BOTH AEROBIC AND ANAEROBIC BOTTLES CRITICAL VALUE NOTED.  VALUE IS CONSISTENT WITH PREVIOUSLY REPORTED AND CALLED VALUE.    Culture (A)  Final    STAPHYLOCOCCUS AUREUS SUSCEPTIBILITIES PERFORMED ON PREVIOUS CULTURE WITHIN THE LAST 5 DAYS. Performed at [facility], [place] [address] [place], [place] [address]    Report Status 04/17/2019 FINAL  Final  SARS Coronavirus 2 (CEPHEID - Performed in [facility] hospital lab), Hosp Order     Status: None   Collection Time: 04/14/19  2:29 PM   Specimen: Nasopharyngeal Swab  Result Value Ref Range Status   SARS Coronavirus 2 NEGATIVE NEGATIVE Final    Comment: (NOTE) If result is NEGATIVE SARS-CoV-2 target nucleic acids are NOT DETECTED. The SARS-CoV-2 RNA is generally detectable in upper and lower  respiratory specimens during the acute phase of infection. The lowest  concentration of SARS-CoV-2 viral copies this assay can detect is 250  copies / mL.  A negative result does not preclude SARS-CoV-2 infection  and should not be used as the sole basis for treatment or other  patient management decisions.  A negative result may occur with  improper specimen collection / handling, submission of specimen other  than nasopharyngeal swab, presence of viral mutation(s) within the  areas targeted by this assay, and inadequate number of viral copies  (<250 copies / mL). A negative result must be combined with clinical  observations, patient history, and epidemiological information. If result is POSITIVE SARS-CoV-2 target nucleic acids are DETECTED. The SARS-CoV-2 RNA is generally detectable in upper and lower  respiratory specimens dur ing the acute phase of infection.  Positive  results are indicative of active infection with SARS-CoV-2.  Clinical  correlation with patient history and other diagnostic information is  necessary to determine patient infection status.  Positive results do  not rule out bacterial infection or co-infection with other viruses. If result is PRESUMPTIVE POSTIVE SARS-CoV-2 nucleic acids MAY BE PRESENT.   A presumptive positive result was obtained on the submitted specimen  and confirmed on repeat testing.  While 2019 novel coronavirus  (SARS-CoV-2) nucleic acids may be present in the submitted sample  additional confirmatory testing may be necessary for epidemiological  and / or clinical management purposes  to differentiate between  SARS-CoV-2 and other Sarbecovirus currently known to infect humans.  If clinically indicated additional testing with an alternate test  methodology [phone]) is advised. The SARS-CoV-2 RNA is generally  detectable in upper and lower respiratory sp ecimens during the acute  phase of infection. The expected result is Negative. Fact Sheet for Patients:  StrictlyIdeas.no Fact Sheet for Healthcare Providers: BankingDealers.co.za This test is not  yet approved or cleared by the [place] FDA and has been authorized for detection and/or diagnosis of SARS-CoV-2 by  FDA under an Emergency Use Authorization (EUA).  This EUA will remain in effect (meaning this test can be used) for the duration of the COVID-19 declaration under Section 564(b)(1) of the Act, 21 U.S.C. section 360bbb-3(b)(1), unless the authorization is terminated or revoked sooner. Performed at [facility], [place] [address] [place], [place] [address]   Aerobic/Anaerobic Culture (surgical/deep wound)     Status: None (Preliminary result)   Collection Time: 04/15/19  9:28 PM   Specimen: Wound  Result Value Ref Range Status   Specimen Description WOUND RIGHT FOREARM  Final   Special Requests PT ON VANC  Final   Gram Stain   Final    RARE WBC PRESENT, PREDOMINANTLY PMN FEW GRAM POSITIVE COCCI Performed at [facility], [place] [address] [place], [place] [address]    Culture   Final    MODERATE STAPHYLOCOCCUS AUREUS NO ANAEROBES ISOLATED; CULTURE IN PROGRESS FOR 5 DAYS    Report Status PENDING  Incomplete   Organism ID, Bacteria STAPHYLOCOCCUS AUREUS  Final      Susceptibility   Staphylococcus aureus - MIC*    CIPROFLOXACIN <=0.5 SENSITIVE Sensitive     ERYTHROMYCIN <=0.25 SENSITIVE Sensitive     GENTAMICIN <=0.5 SENSITIVE Sensitive     OXACILLIN <=0.25 SENSITIVE Sensitive     TETRACYCLINE <=1 SENSITIVE Sensitive     VANCOMYCIN <=0.5 SENSITIVE Sensitive     TRIMETH/SULFA <=10 SENSITIVE Sensitive     CLINDAMYCIN <=0.25 SENSITIVE Sensitive     RIFAMPIN <=0.5 SENSITIVE Sensitive     Inducible Clindamycin NEGATIVE Sensitive     * MODERATE STAPHYLOCOCCUS AUREUS  Culture, blood (routine x 2)     Status: None (Preliminary result)   Collection Time: 04/16/19  9:09 AM   Specimen: BLOOD  Result Value Ref Range Status   Specimen Description BLOOD LEFT ANTECUBITAL  Final   Special Requests   Final    BOTTLES DRAWN AEROBIC AND ANAEROBIC Blood Culture  adequate volume   Culture   Final    NO GROWTH 1 DAY Performed at [facility], [address], [place],  [address]    Report Status PENDING  Incomplete  Culture, blood (routine x 2)     Status: None (Preliminary result)   Collection Time: 04/16/19  9:15 AM   Specimen: BLOOD RIGHT HAND  Result Value Ref Range Status   Specimen Description BLOOD RIGHT HAND  Final   Special Requests   Final    BOTTLES DRAWN AEROBIC ONLY Blood Culture adequate volume   Culture   Final    NO GROWTH 1 DAY Performed at [facility], [place] [address] [place],  [address]    Report Status PENDING  Incomplete     Terri Piedra, [place] [facility] Pager  04/17/2019  4:32 PM

## 2019-04-17 NOTE — Progress Notes (Signed)
PROGRESS NOTE    Frank Fuller  TUU:[id number] DOB: Sep 16, 1961 DOA: 04/14/2019 PCP: [facility]:   Brief Narrative:  As per he H&P "Frank Fuller  is a 58 y.o. male, with past medical history significant for ESRD, diabetes mellitus, CVA was presenting today with 2 days history of fever, shortness of breath and mild cough.  He reports also pleuritic chest pain.  The patient was scheduled for dialysis today however transferred to the emergency room due to the fever.  Patient also complains of diffuse abdominal pain but no diarrhea  . No recent exposure to COVID positive patient's Work-up in the emergency room right middle and lower lobe pneumonia.  His white blood cell count was 8.3 hemoglobin, platelets 92,000.  His potassium was 6.3 and troponin was 0.10.  Discussed with nephrology and the patient will have dialysis today after his COVID-19 was negative".    04/17/2019: Patient was admitted with MSSA bacteremia/sepsis.  Patient underwent excision of part of the right upper extremity AV graft and specimen was sent for culture.  Infectious disease input is appreciated.  Vascular surgery input is appreciated.  For TEE in the morning as per infectious disease team.  Meanwhile, patient is continued on IV cefazolin. Assessment & Plan:   Principal Problem:   MSSA bacteremia Active Problems:   Pneumonia   Hospital-acquired pneumonia  Bacteremia/sepsis secondary to MSSA: Continue IV cefazolin. IV vancomycin/cefepime have been discontinued. Further management will depend on hospital course. 04/17/2019: MSSA likely secondary to AV graft infection.  Vascular surgery and infectious disease team input is appreciated.  Continue IV antibiotics for now.  For TEE in am.  ESRD on hemodialysis: Hemodialysis team is managing.  Elevated troponin: Likely type II elevation. No chest pain.  Hypertension:  Continue to optimize.  History of hep C and CVA.   DVT prophylaxis:  Subacute heparin Code Status: Full code Family Communication:  Disposition Plan: Home   Consultants:   Nephrology  Procedures:   Hemodialysis  Antimicrobials:   IV cefazolin  IV vancomycin discontinued  IV cefepime discontinued   Subjective: No fever or chills. Hiccups have resolved.   Objective: Vitals:   04/17/19 1000 04/17/19 1030 04/17/19 1040 04/17/19 1202  BP: (!) 149/88 (!) 146/88 (!) 166/93 (!) 155/88  Pulse: 78 69 68 75  Resp:   17 18  Temp:   98.5 F (36.9 C) 97.8 F (36.6 C)  TempSrc:   Oral Oral  SpO2:   97% 94%  Weight:      Height:        Intake/Output Summary (Last 24 hours) at 04/17/2019 1413 Last data filed at 04/17/2019 1040 Gross per 24 hour  Intake 130.67 ml  Output 2000 ml  Net -1869.33 ml   Filed Weights   04/15/19 0015 04/15/19 0255 04/17/19 0735  Weight: 88.9 kg 87.5 kg 91.5 kg    Examination:  General exam: Appears calm and comfortable  Respiratory system: Clear to auscultation.  Cardiovascular system: S1 & S2.  Gastrointestinal system: Abdomen is nondistended, soft and nontender. No organomegaly or masses felt. Normal bowel sounds heard. Central nervous system: Alert and oriented.  Moves all extremities.   Extremities: No leg edema.  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  Data Reviewed: I have personally reviewed following labs and imaging studies  CBC: Recent Labs  Lab 04/12/19 1100 04/14/19 1420 04/15/19 0840 04/16/19 0625 04/17/19 0750  WBC 8.2 8.3 8.2 8.1 9.1  NEUTROABS 6.5 7.3  --  6.8  --  HGB 12.7* 12.2* 11.1* 10.6* 9.9*  HCT 37.8* 35.6* 32.3* 30.9* 28.3*  MCV 91.7 90.8 90.2 90.9 89.8  PLT 142* 92* 90* 96* 956*   Basic Metabolic Panel: Recent Labs  Lab 04/12/19 1100 04/14/19 1420 04/14/19 2232 04/15/19 0840 04/16/19 0625 04/17/19 0750  NA 137 131*  --  132* 129* 127*  K 4.2 6.3*  --  4.7 5.9* 4.8  CL 95* 86*  --  91* 89* 87*  CO2 28 21*  --  24 22 20*  GLUCOSE 102* 113* 139*  111* 134* 109*  BUN 25* 80*  --  47* 72* 108*  CREATININE 7.68* 15.82*  --  11.70* 13.71* 14.95*  CALCIUM 9.4 9.3  --  9.0 8.9 8.9  PHOS  --   --   --  5.8* 6.4* 4.8*   GFR: Estimated Creatinine Clearance: 6.4 mL/min (A) (by C-G formula based on SCr of 14.95 mg/dL (H)). Liver Function Tests: Recent Labs  Lab 04/12/19 1100 04/14/19 1420 04/15/19 0840 04/16/19 0625 04/17/19 0750  AST 19 27  --   --   --   ALT 8 13  --   --   --   ALKPHOS 70 75  --   --   --   BILITOT 0.7 0.6  --   --   --   PROT 7.9 7.3  --   --   --   ALBUMIN 3.9 3.4* 2.8* 2.9* 2.7*   Recent Labs  Lab 04/12/19 1100  LIPASE 31   No results for input(s): AMMONIA in the last 168 hours. Coagulation Profile: Recent Labs  Lab 04/14/19 1420  INR 1.2   Cardiac Enzymes: Recent Labs  Lab 04/14/19 1420  TROPONINI 0.10*   BNP (last 3 results) No results for input(s): PROBNP in the last 8760 hours. HbA1C: No results for input(s): HGBA1C in the last 72 hours. CBG: Recent Labs  Lab 04/14/19 1707 04/14/19 1830 04/14/19 2158 04/15/19 1847  GLUCAP 103* 169* 147* 107*   Lipid Profile: No results for input(s): CHOL, HDL, LDLCALC, TRIG, CHOLHDL, LDLDIRECT in the last 72 hours. Thyroid Function Tests: No results for input(s): TSH, T4TOTAL, FREET4, T3FREE, THYROIDAB in the last 72 hours. Anemia Panel: No results for input(s): VITAMINB12, FOLATE, FERRITIN, TIBC, IRON, RETICCTPCT in the last 72 hours. Urine analysis:    Component Value Date/Time   COLORURINE YELLOW 08/04/2017 1824   APPEARANCEUR HAZY (A) 08/04/2017 1824   LABSPEC 1.009 08/04/2017 1824   PHURINE 9.0 (H) 08/04/2017 1824   GLUCOSEU 150 (A) 08/04/2017 1824   HGBUR NEGATIVE 08/04/2017 1824   BILIRUBINUR NEGATIVE 08/04/2017 1824   KETONESUR NEGATIVE 08/04/2017 1824   PROTEINUR >=300 (A) 08/04/2017 1824   NITRITE NEGATIVE 08/04/2017 1824   LEUKOCYTESUR NEGATIVE 08/04/2017 1824   Sepsis Labs: @LABRCNTIP (procalcitonin:4,lacticidven:4)  )  Recent Results (from the past 240 hour(s))  SARS Coronavirus 2 (CEPHEID- Performed in [place] hospital lab), Hosp Order     Status: None   Collection Time: 04/12/19 10:50 AM   Specimen: Nasopharyngeal Swab  Result Value Ref Range Status   SARS Coronavirus 2 NEGATIVE NEGATIVE Final    Comment: (NOTE) If result is NEGATIVE SARS-CoV-2 target nucleic acids are NOT DETECTED. The SARS-CoV-2 RNA is generally detectable in upper and lower  respiratory specimens during the acute phase of infection. The lowest  concentration of SARS-CoV-2 viral copies this assay can detect is 250  copies / mL. A negative result does not preclude SARS-CoV-2 infection  and should not be used  as the sole basis for treatment or other  patient management decisions.  A negative result may occur with  improper specimen collection / handling, submission of specimen other  than nasopharyngeal swab, presence of viral mutation(s) within the  areas targeted by this assay, and inadequate number of viral copies  (<250 copies / mL). A negative result must be combined with clinical  observations, patient history, and epidemiological information. If result is POSITIVE SARS-CoV-2 target nucleic acids are DETECTED. The SARS-CoV-2 RNA is generally detectable in upper and lower  respiratory specimens dur ing the acute phase of infection.  Positive  results are indicative of active infection with SARS-CoV-2.  Clinical  correlation with patient history and other diagnostic information is  necessary to determine patient infection status.  Positive results do  not rule out bacterial infection or co-infection with other viruses. If result is PRESUMPTIVE POSTIVE SARS-CoV-2 nucleic acids MAY BE PRESENT.   A presumptive positive result was obtained on the submitted specimen  and confirmed on repeat testing.  While 2019 novel coronavirus  (SARS-CoV-2) nucleic acids may be present in the submitted sample  additional confirmatory  testing may be necessary for epidemiological  and / or clinical management purposes  to differentiate between  SARS-CoV-2 and other Sarbecovirus currently known to infect humans.  If clinically indicated additional testing with an alternate test  methodology [phone]) is advised. The SARS-CoV-2 RNA is generally  detectable in upper and lower respiratory sp ecimens during the acute  phase of infection. The expected result is Negative. Fact Sheet for Patients:  StrictlyIdeas.no Fact Sheet for Healthcare Providers: BankingDealers.co.za This test is not yet approved or cleared by the [place] FDA and has been authorized for detection and/or diagnosis of SARS-CoV-2 by FDA under an Emergency Use Authorization (EUA).  This EUA will remain in effect (meaning this test can be used) for the duration of the COVID-19 declaration under Section 564(b)(1) of the Act, 21 U.S.C. section 360bbb-3(b)(1), unless the authorization is terminated or revoked sooner. Performed at [facility], [place] [address] [place], [place] [address]   Culture, blood (routine x 2)     Status: Abnormal   Collection Time: 04/14/19  2:10 PM   Specimen: BLOOD  Result Value Ref Range Status   Specimen Description BLOOD LEFT WRIST  Final   Special Requests   Final    BOTTLES DRAWN AEROBIC AND ANAEROBIC Blood Culture adequate volume   Culture  Setup Time   Final    IN BOTH AEROBIC AND ANAEROBIC BOTTLES GRAM POSITIVE COCCI CRITICAL RESULT CALLED TO, READ BACK BY AND VERIFIED WITH: G. ABBOTT,PHARMD 0301 04/15/2019 Mena Goes Performed at [facility], [place] [address], [place], [place] [address]    Culture STAPHYLOCOCCUS AUREUS (A)  Final   Report Status 04/17/2019 FINAL  Final   Organism ID, Bacteria STAPHYLOCOCCUS AUREUS  Final      Susceptibility   Staphylococcus aureus - MIC*    CIPROFLOXACIN <=0.5 SENSITIVE Sensitive     ERYTHROMYCIN <=0.25 SENSITIVE Sensitive      GENTAMICIN <=0.5 SENSITIVE Sensitive     OXACILLIN <=0.25 SENSITIVE Sensitive     TETRACYCLINE <=1 SENSITIVE Sensitive     VANCOMYCIN 1 SENSITIVE Sensitive     TRIMETH/SULFA <=10 SENSITIVE Sensitive     CLINDAMYCIN <=0.25 SENSITIVE Sensitive     RIFAMPIN <=0.5 SENSITIVE Sensitive     Inducible Clindamycin NEGATIVE Sensitive     * STAPHYLOCOCCUS AUREUS  Blood Culture ID Panel (Reflexed)  Status: Abnormal   Collection Time: 04/14/19  2:10 PM  Result Value Ref Range Status   Enterococcus species NOT DETECTED NOT DETECTED Final   Listeria monocytogenes NOT DETECTED NOT DETECTED Final   Staphylococcus species DETECTED (A) NOT DETECTED Final    Comment: CRITICAL RESULT CALLED TO, READ BACK BY AND VERIFIED WITH: G. ABBOTT,PHARMD 0301 04/15/2019 T. TYSOR    Staphylococcus aureus (BCID) DETECTED (A) NOT DETECTED Final    Comment: Methicillin (oxacillin) susceptible Staphylococcus aureus (MSSA). Preferred therapy is anti staphylococcal beta lactam antibiotic (Cefazolin or Nafcillin), unless clinically contraindicated. CRITICAL RESULT CALLED TO, READ BACK BY AND VERIFIED WITH: G. ABBOTT,PHARMD 0301 04/15/2019 T. TYSOR    Methicillin resistance NOT DETECTED NOT DETECTED Final   Streptococcus species NOT DETECTED NOT DETECTED Final   Streptococcus agalactiae NOT DETECTED NOT DETECTED Final   Streptococcus pneumoniae NOT DETECTED NOT DETECTED Final   Streptococcus pyogenes NOT DETECTED NOT DETECTED Final   Acinetobacter baumannii NOT DETECTED NOT DETECTED Final   Enterobacteriaceae species NOT DETECTED NOT DETECTED Final   Enterobacter cloacae complex NOT DETECTED NOT DETECTED Final   Escherichia coli NOT DETECTED NOT DETECTED Final   Klebsiella oxytoca NOT DETECTED NOT DETECTED Final   Klebsiella pneumoniae NOT DETECTED NOT DETECTED Final   Proteus species NOT DETECTED NOT DETECTED Final   Serratia marcescens NOT DETECTED NOT DETECTED Final   Haemophilus influenzae NOT DETECTED NOT  DETECTED Final   Neisseria meningitidis NOT DETECTED NOT DETECTED Final   Pseudomonas aeruginosa NOT DETECTED NOT DETECTED Final   Candida albicans NOT DETECTED NOT DETECTED Final   Candida glabrata NOT DETECTED NOT DETECTED Final   Candida krusei NOT DETECTED NOT DETECTED Final   Candida parapsilosis NOT DETECTED NOT DETECTED Final   Candida tropicalis NOT DETECTED NOT DETECTED Final    Comment: Performed at [facility], [place]. [address] [place], [place] [address]  Culture, blood (routine x 2)     Status: Abnormal   Collection Time: 04/14/19  2:20 PM   Specimen: BLOOD  Result Value Ref Range Status   Specimen Description BLOOD BLOOD LEFT FOREARM  Final   Special Requests   Final    BOTTLES DRAWN AEROBIC AND ANAEROBIC Blood Culture results may not be optimal due to an inadequate volume of blood received in culture bottles   Culture  Setup Time   Final    GRAM POSITIVE COCCI IN CLUSTERS IN BOTH AEROBIC AND ANAEROBIC BOTTLES CRITICAL VALUE NOTED.  VALUE IS CONSISTENT WITH PREVIOUSLY REPORTED AND CALLED VALUE.    Culture (A)  Final    STAPHYLOCOCCUS AUREUS SUSCEPTIBILITIES PERFORMED ON PREVIOUS CULTURE WITHIN THE LAST 5 DAYS. Performed at [facility], [place] [address] [place], [place] [address]    Report Status 04/17/2019 FINAL  Final  SARS Coronavirus 2 (CEPHEID - Performed in [place] hospital lab), Hosp Order     Status: None   Collection Time: 04/14/19  2:29 PM   Specimen: Nasopharyngeal Swab  Result Value Ref Range Status   SARS Coronavirus 2 NEGATIVE NEGATIVE Final    Comment: (NOTE) If result is NEGATIVE SARS-CoV-2 target nucleic acids are NOT DETECTED. The SARS-CoV-2 RNA is generally detectable in upper and lower  respiratory specimens during the acute phase of infection. The lowest  concentration of SARS-CoV-2 viral copies this assay can detect is 250  copies / mL. A negative result does not preclude SARS-CoV-2 infection  and should not be used as the sole  basis for treatment or other  patient management decisions.  A negative result may occur with  improper specimen collection / handling, submission of specimen other  than nasopharyngeal swab, presence of viral mutation(s) within the  areas targeted by this assay, and inadequate number of viral copies  (<250 copies / mL). A negative result must be combined with clinical  observations, patient history, and epidemiological information. If result is POSITIVE SARS-CoV-2 target nucleic acids are DETECTED. The SARS-CoV-2 RNA is generally detectable in upper and lower  respiratory specimens dur ing the acute phase of infection.  Positive  results are indicative of active infection with SARS-CoV-2.  Clinical  correlation with patient history and other diagnostic information is  necessary to determine patient infection status.  Positive results do  not rule out bacterial infection or co-infection with other viruses. If result is PRESUMPTIVE POSTIVE SARS-CoV-2 nucleic acids MAY BE PRESENT.   A presumptive positive result was obtained on the submitted specimen  and confirmed on repeat testing.  While 2019 novel coronavirus  (SARS-CoV-2) nucleic acids may be present in the submitted sample  additional confirmatory testing may be necessary for epidemiological  and / or clinical management purposes  to differentiate between  SARS-CoV-2 and other Sarbecovirus currently known to infect humans.  If clinically indicated additional testing with an alternate test  methodology [phone]) is advised. The SARS-CoV-2 RNA is generally  detectable in upper and lower respiratory sp ecimens during the acute  phase of infection. The expected result is Negative. Fact Sheet for Patients:  StrictlyIdeas.no Fact Sheet for Healthcare Providers: BankingDealers.co.za This test is not yet approved or cleared by the [place] FDA and has been authorized for detection  and/or diagnosis of SARS-CoV-2 by FDA under an Emergency Use Authorization (EUA).  This EUA will remain in effect (meaning this test can be used) for the duration of the COVID-19 declaration under Section 564(b)(1) of the Act, 21 U.S.C. section 360bbb-3(b)(1), unless the authorization is terminated or revoked sooner. Performed at [facility], [place] [address] [place], [place] [address]   Aerobic/Anaerobic Culture (surgical/deep wound)     Status: None (Preliminary result)   Collection Time: 04/15/19  9:28 PM   Specimen: Wound  Result Value Ref Range Status   Specimen Description WOUND RIGHT FOREARM  Final   Special Requests PT ON VANC  Final   Gram Stain   Final    RARE WBC PRESENT, PREDOMINANTLY PMN FEW GRAM POSITIVE COCCI Performed at [facility], [place] [address], [place], [place] [address]    Culture   Final    MODERATE STAPHYLOCOCCUS AUREUS NO ANAEROBES ISOLATED; CULTURE IN PROGRESS FOR 5 DAYS    Report Status PENDING  Incomplete   Organism ID, Bacteria STAPHYLOCOCCUS AUREUS  Final      Susceptibility   Staphylococcus aureus - MIC*    CIPROFLOXACIN <=0.5 SENSITIVE Sensitive     ERYTHROMYCIN <=0.25 SENSITIVE Sensitive     GENTAMICIN <=0.5 SENSITIVE Sensitive     OXACILLIN <=0.25 SENSITIVE Sensitive     TETRACYCLINE <=1 SENSITIVE Sensitive     VANCOMYCIN <=0.5 SENSITIVE Sensitive     TRIMETH/SULFA <=10 SENSITIVE Sensitive     CLINDAMYCIN <=0.25 SENSITIVE Sensitive     RIFAMPIN <=0.5 SENSITIVE Sensitive     Inducible Clindamycin NEGATIVE Sensitive     * MODERATE STAPHYLOCOCCUS AUREUS  Culture, blood (routine x 2)     Status: None (Preliminary result)   Collection Time: 04/16/19  9:09 AM   Specimen: BLOOD  Result Value Ref Range Status  Specimen Description BLOOD LEFT ANTECUBITAL  Final   Special Requests   Final    BOTTLES DRAWN AEROBIC AND ANAEROBIC Blood Culture adequate volume   Culture   Final    NO GROWTH 1 DAY Performed at [facility],  [address] [place], [place] [address]    Report Status PENDING  Incomplete  Culture, blood (routine x 2)     Status: None (Preliminary result)   Collection Time: 04/16/19  9:15 AM   Specimen: BLOOD RIGHT HAND  Result Value Ref Range Status   Specimen Description BLOOD RIGHT HAND  Final   Special Requests   Final    BOTTLES DRAWN AEROBIC ONLY Blood Culture adequate volume   Culture   Final    NO GROWTH 1 DAY Performed at [facility], [place] [address], [place], [place] [address]    Report Status PENDING  Incomplete         Radiology Studies: No results found.      Scheduled Meds: . amLODipine  10 mg Oral QHS  . aspirin EC  81 mg Oral Daily  . Chlorhexidine Gluconate Cloth  6 each Topical Q0600  . ferric citrate  630 mg Oral TID WC  . heparin  5,000 Units Subcutaneous Q8H  . hydrALAZINE  100 mg Oral TID  . labetalol  300 mg Oral TID  . sevelamer carbonate  800 mg Oral TID WC  . sodium chloride flush  3 mL Intravenous Q12H   Continuous Infusions: . sodium chloride Stopped (04/15/19 2215)  . sodium chloride Stopped (04/15/19 2215)  .  ceFAZolin (ANCEF) IV Stopped (04/17/19 0712)     LOS: 3 days    Time spent: 25 Minutes.    Dana Allan, MD  [facility] Pager #: [phone] 7PM-7AM contact night coverage as above

## 2019-04-17 NOTE — H&P (View-Only) (Signed)
PROGRESS NOTE    Frank Fuller  YFV:[id number] DOB: 1961-06-03 DOA: 04/14/2019 PCP: [facility] Specialists:   Brief Narrative:  As per he H&P "Frank Fuller  is a 58 y.o. male, with past medical history significant for ESRD, diabetes mellitus, CVA was presenting today with 2 days history of fever, shortness of breath and mild cough.  He reports also pleuritic chest pain.  The patient was scheduled for dialysis today however transferred to the emergency room due to the fever.  Patient also complains of diffuse abdominal pain but no diarrhea  . No recent exposure to COVID positive patient's Work-up in the emergency room right middle and lower lobe pneumonia.  His white blood cell count was 8.3 hemoglobin, platelets 92,000.  His potassium was 6.3 and troponin was 0.10.  Discussed with nephrology and the patient will have dialysis today after his COVID-19 was negative".    04/17/2019: Patient was admitted with MSSA bacteremia/sepsis.  Patient underwent excision of part of the right upper extremity AV graft and specimen was sent for culture.  Infectious disease input is appreciated.  Vascular surgery input is appreciated.  For TEE in the morning as per infectious disease team.  Meanwhile, patient is continued on IV cefazolin. Assessment & Plan:   Principal Problem:   MSSA bacteremia Active Problems:   Pneumonia   Hospital-acquired pneumonia  Bacteremia/sepsis secondary to MSSA: Continue IV cefazolin. IV vancomycin/cefepime have been discontinued. Further management will depend on hospital course. 04/17/2019: MSSA likely secondary to AV graft infection.  Vascular surgery and infectious disease team input is appreciated.  Continue IV antibiotics for now.  For TEE in am.  ESRD on hemodialysis: Hemodialysis team is managing.  Elevated troponin: Likely type II elevation. No chest pain.  Hypertension:  Continue to optimize.  History of hep C and CVA.   DVT prophylaxis:  Subacute heparin Code Status: Full code Family Communication:  Disposition Plan: Home   Consultants:   Nephrology  Procedures:   Hemodialysis  Antimicrobials:   IV cefazolin  IV vancomycin discontinued  IV cefepime discontinued   Subjective: No fever or chills. Hiccups have resolved.   Objective: Vitals:   04/17/19 1000 04/17/19 1030 04/17/19 1040 04/17/19 1202  BP: (!) 149/88 (!) 146/88 (!) 166/93 (!) 155/88  Pulse: 78 69 68 75  Resp:   17 18  Temp:   98.5 F (36.9 C) 97.8 F (36.6 C)  TempSrc:   Oral Oral  SpO2:   97% 94%  Weight:      Height:        Intake/Output Summary (Last 24 hours) at 04/17/2019 1413 Last data filed at 04/17/2019 1040 Gross per 24 hour  Intake 130.67 ml  Output 2000 ml  Net -1869.33 ml   Filed Weights   04/15/19 0015 04/15/19 0255 04/17/19 0735  Weight: 88.9 kg 87.5 kg 91.5 kg    Examination:  General exam: Appears calm and comfortable  Respiratory system: Clear to auscultation.  Cardiovascular system: S1 & S2.  Gastrointestinal system: Abdomen is nondistended, soft and nontender. No organomegaly or masses felt. Normal bowel sounds heard. Central nervous system: Alert and oriented.  Moves all extremities.   Extremities: No leg edema.  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  Data Reviewed: I have personally reviewed following labs and imaging studies  CBC: Recent Labs  Lab 04/12/19 1100 04/14/19 1420 04/15/19 0840 04/16/19 0625 04/17/19 0750  WBC 8.2 8.3 8.2 8.1 9.1  NEUTROABS 6.5 7.3  --  6.8  --  HGB 12.7* 12.2* 11.1* 10.6* 9.9*  HCT 37.8* 35.6* 32.3* 30.9* 28.3*  MCV 91.7 90.8 90.2 90.9 89.8  PLT 142* 92* 90* 96* 485*   Basic Metabolic Panel: Recent Labs  Lab 04/12/19 1100 04/14/19 1420 04/14/19 2232 04/15/19 0840 04/16/19 0625 04/17/19 0750  NA 137 131*  --  132* 129* 127*  K 4.2 6.3*  --  4.7 5.9* 4.8  CL 95* 86*  --  91* 89* 87*  CO2 28 21*  --  24 22 20*  GLUCOSE 102* 113* 139*  111* 134* 109*  BUN 25* 80*  --  47* 72* 108*  CREATININE 7.68* 15.82*  --  11.70* 13.71* 14.95*  CALCIUM 9.4 9.3  --  9.0 8.9 8.9  PHOS  --   --   --  5.8* 6.4* 4.8*   GFR: Estimated Creatinine Clearance: 6.4 mL/min (A) (by C-G formula based on SCr of 14.95 mg/dL (H)). Liver Function Tests: Recent Labs  Lab 04/12/19 1100 04/14/19 1420 04/15/19 0840 04/16/19 0625 04/17/19 0750  AST 19 27  --   --   --   ALT 8 13  --   --   --   ALKPHOS 70 75  --   --   --   BILITOT 0.7 0.6  --   --   --   PROT 7.9 7.3  --   --   --   ALBUMIN 3.9 3.4* 2.8* 2.9* 2.7*   Recent Labs  Lab 04/12/19 1100  LIPASE 31   No results for input(s): AMMONIA in the last 168 hours. Coagulation Profile: Recent Labs  Lab 04/14/19 1420  INR 1.2   Cardiac Enzymes: Recent Labs  Lab 04/14/19 1420  TROPONINI 0.10*   BNP (last 3 results) No results for input(s): PROBNP in the last 8760 hours. HbA1C: No results for input(s): HGBA1C in the last 72 hours. CBG: Recent Labs  Lab 04/14/19 1707 04/14/19 1830 04/14/19 2158 04/15/19 1847  GLUCAP 103* 169* 147* 107*   Lipid Profile: No results for input(s): CHOL, HDL, LDLCALC, TRIG, CHOLHDL, LDLDIRECT in the last 72 hours. Thyroid Function Tests: No results for input(s): TSH, T4TOTAL, FREET4, T3FREE, THYROIDAB in the last 72 hours. Anemia Panel: No results for input(s): VITAMINB12, FOLATE, FERRITIN, TIBC, IRON, RETICCTPCT in the last 72 hours. Urine analysis:    Component Value Date/Time   COLORURINE YELLOW 08/04/2017 1824   APPEARANCEUR HAZY (A) 08/04/2017 1824   LABSPEC 1.009 08/04/2017 1824   PHURINE 9.0 (H) 08/04/2017 1824   GLUCOSEU 150 (A) 08/04/2017 1824   HGBUR NEGATIVE 08/04/2017 1824   BILIRUBINUR NEGATIVE 08/04/2017 1824   KETONESUR NEGATIVE 08/04/2017 1824   PROTEINUR >=300 (A) 08/04/2017 1824   NITRITE NEGATIVE 08/04/2017 1824   LEUKOCYTESUR NEGATIVE 08/04/2017 1824   Sepsis Labs: @LABRCNTIP (IOEVOJJKKXFGH:8,EXHBZJIRCVE:9)  )  Recent Results (from the past 240 hour(s))  SARS Coronavirus 2 (CEPHEID- Performed in [place] hospital lab), Hosp Order     Status: None   Collection Time: 04/12/19 10:50 AM   Specimen: Nasopharyngeal Swab  Result Value Ref Range Status   SARS Coronavirus 2 NEGATIVE NEGATIVE Final    Comment: (NOTE) If result is NEGATIVE SARS-CoV-2 target nucleic acids are NOT DETECTED. The SARS-CoV-2 RNA is generally detectable in upper and lower  respiratory specimens during the acute phase of infection. The lowest  concentration of SARS-CoV-2 viral copies this assay can detect is 250  copies / mL. A negative result does not preclude SARS-CoV-2 infection  and should not be used  as the sole basis for treatment or other  patient management decisions.  A negative result may occur with  improper specimen collection / handling, submission of specimen other  than nasopharyngeal swab, presence of viral mutation(s) within the  areas targeted by this assay, and inadequate number of viral copies  (<250 copies / mL). A negative result must be combined with clinical  observations, patient history, and epidemiological information. If result is POSITIVE SARS-CoV-2 target nucleic acids are DETECTED. The SARS-CoV-2 RNA is generally detectable in upper and lower  respiratory specimens dur ing the acute phase of infection.  Positive  results are indicative of active infection with SARS-CoV-2.  Clinical  correlation with patient history and other diagnostic information is  necessary to determine patient infection status.  Positive results do  not rule out bacterial infection or co-infection with other viruses. If result is PRESUMPTIVE POSTIVE SARS-CoV-2 nucleic acids MAY BE PRESENT.   A presumptive positive result was obtained on the submitted specimen  and confirmed on repeat testing.  While 2019 novel coronavirus  (SARS-CoV-2) nucleic acids may be present in the submitted sample  additional confirmatory  testing may be necessary for epidemiological  and / or clinical management purposes  to differentiate between  SARS-CoV-2 and other Sarbecovirus currently known to infect humans.  If clinically indicated additional testing with an alternate test  methodology [phone]) is advised. The SARS-CoV-2 RNA is generally  detectable in upper and lower respiratory sp ecimens during the acute  phase of infection. The expected result is Negative. Fact Sheet for Patients:  StrictlyIdeas.no Fact Sheet for Healthcare Providers: BankingDealers.co.za This test is not yet approved or cleared by the [place] FDA and has been authorized for detection and/or diagnosis of SARS-CoV-2 by FDA under an Emergency Use Authorization (EUA).  This EUA will remain in effect (meaning this test can be used) for the duration of the COVID-19 declaration under Section 564(b)(1) of the Act, 21 U.S.C. section 360bbb-3(b)(1), unless the authorization is terminated or revoked sooner. Performed at [facility], [place] [address], [place], [place] [address]   Culture, blood (routine x 2)     Status: Abnormal   Collection Time: 04/14/19  2:10 PM   Specimen: BLOOD  Result Value Ref Range Status   Specimen Description BLOOD LEFT WRIST  Final   Special Requests   Final    BOTTLES DRAWN AEROBIC AND ANAEROBIC Blood Culture adequate volume   Culture  Setup Time   Final    IN BOTH AEROBIC AND ANAEROBIC BOTTLES GRAM POSITIVE COCCI CRITICAL RESULT CALLED TO, READ BACK BY AND VERIFIED WITH: G. ABBOTT,PHARMD 0301 04/15/2019 Mena Goes Performed at [facility], [place] [address] [place], [place] [address]    Culture STAPHYLOCOCCUS AUREUS (A)  Final   Report Status 04/17/2019 FINAL  Final   Organism ID, Bacteria STAPHYLOCOCCUS AUREUS  Final      Susceptibility   Staphylococcus aureus - MIC*    CIPROFLOXACIN <=0.5 SENSITIVE Sensitive     ERYTHROMYCIN <=0.25 SENSITIVE Sensitive      GENTAMICIN <=0.5 SENSITIVE Sensitive     OXACILLIN <=0.25 SENSITIVE Sensitive     TETRACYCLINE <=1 SENSITIVE Sensitive     VANCOMYCIN 1 SENSITIVE Sensitive     TRIMETH/SULFA <=10 SENSITIVE Sensitive     CLINDAMYCIN <=0.25 SENSITIVE Sensitive     RIFAMPIN <=0.5 SENSITIVE Sensitive     Inducible Clindamycin NEGATIVE Sensitive     * STAPHYLOCOCCUS AUREUS  Blood Culture ID Panel (Reflexed)  Status: Abnormal   Collection Time: 04/14/19  2:10 PM  Result Value Ref Range Status   Enterococcus species NOT DETECTED NOT DETECTED Final   Listeria monocytogenes NOT DETECTED NOT DETECTED Final   Staphylococcus species DETECTED (A) NOT DETECTED Final    Comment: CRITICAL RESULT CALLED TO, READ BACK BY AND VERIFIED WITH: G. ABBOTT,PHARMD 0301 04/15/2019 T. TYSOR    Staphylococcus aureus (BCID) DETECTED (A) NOT DETECTED Final    Comment: Methicillin (oxacillin) susceptible Staphylococcus aureus (MSSA). Preferred therapy is anti staphylococcal beta lactam antibiotic (Cefazolin or Nafcillin), unless clinically contraindicated. CRITICAL RESULT CALLED TO, READ BACK BY AND VERIFIED WITH: G. ABBOTT,PHARMD 0301 04/15/2019 T. TYSOR    Methicillin resistance NOT DETECTED NOT DETECTED Final   Streptococcus species NOT DETECTED NOT DETECTED Final   Streptococcus agalactiae NOT DETECTED NOT DETECTED Final   Streptococcus pneumoniae NOT DETECTED NOT DETECTED Final   Streptococcus pyogenes NOT DETECTED NOT DETECTED Final   Acinetobacter baumannii NOT DETECTED NOT DETECTED Final   Enterobacteriaceae species NOT DETECTED NOT DETECTED Final   Enterobacter cloacae complex NOT DETECTED NOT DETECTED Final   Escherichia coli NOT DETECTED NOT DETECTED Final   Klebsiella oxytoca NOT DETECTED NOT DETECTED Final   Klebsiella pneumoniae NOT DETECTED NOT DETECTED Final   Proteus species NOT DETECTED NOT DETECTED Final   Serratia marcescens NOT DETECTED NOT DETECTED Final   Haemophilus influenzae NOT DETECTED NOT  DETECTED Final   Neisseria meningitidis NOT DETECTED NOT DETECTED Final   Pseudomonas aeruginosa NOT DETECTED NOT DETECTED Final   Candida albicans NOT DETECTED NOT DETECTED Final   Candida glabrata NOT DETECTED NOT DETECTED Final   Candida krusei NOT DETECTED NOT DETECTED Final   Candida parapsilosis NOT DETECTED NOT DETECTED Final   Candida tropicalis NOT DETECTED NOT DETECTED Final    Comment: Performed at [facility], [place] [address] [place], [place] [address]  Culture, blood (routine x 2)     Status: Abnormal   Collection Time: 04/14/19  2:20 PM   Specimen: BLOOD  Result Value Ref Range Status   Specimen Description BLOOD BLOOD LEFT FOREARM  Final   Special Requests   Final    BOTTLES DRAWN AEROBIC AND ANAEROBIC Blood Culture results may not be optimal due to an inadequate volume of blood received in culture bottles   Culture  Setup Time   Final    GRAM POSITIVE COCCI IN CLUSTERS IN BOTH AEROBIC AND ANAEROBIC BOTTLES CRITICAL VALUE NOTED.  VALUE IS CONSISTENT WITH PREVIOUSLY REPORTED AND CALLED VALUE.    Culture (A)  Final    STAPHYLOCOCCUS AUREUS SUSCEPTIBILITIES PERFORMED ON PREVIOUS CULTURE WITHIN THE LAST 5 DAYS. Performed at [facility], [place] [address], [place], [place] [address]    Report Status 04/17/2019 FINAL  Final  SARS Coronavirus 2 (CEPHEID - Performed in [place] hospital lab), Hosp Order     Status: None   Collection Time: 04/14/19  2:29 PM   Specimen: Nasopharyngeal Swab  Result Value Ref Range Status   SARS Coronavirus 2 NEGATIVE NEGATIVE Final    Comment: (NOTE) If result is NEGATIVE SARS-CoV-2 target nucleic acids are NOT DETECTED. The SARS-CoV-2 RNA is generally detectable in upper and lower  respiratory specimens during the acute phase of infection. The lowest  concentration of SARS-CoV-2 viral copies this assay can detect is 250  copies / mL. A negative result does not preclude SARS-CoV-2 infection  and should not be used as the sole  basis for treatment or other  patient management decisions.  A negative result may occur with  improper specimen collection / handling, submission of specimen other  than nasopharyngeal swab, presence of viral mutation(s) within the  areas targeted by this assay, and inadequate number of viral copies  (<250 copies / mL). A negative result must be combined with clinical  observations, patient history, and epidemiological information. If result is POSITIVE SARS-CoV-2 target nucleic acids are DETECTED. The SARS-CoV-2 RNA is generally detectable in upper and lower  respiratory specimens dur ing the acute phase of infection.  Positive  results are indicative of active infection with SARS-CoV-2.  Clinical  correlation with patient history and other diagnostic information is  necessary to determine patient infection status.  Positive results do  not rule out bacterial infection or co-infection with other viruses. If result is PRESUMPTIVE POSTIVE SARS-CoV-2 nucleic acids MAY BE PRESENT.   A presumptive positive result was obtained on the submitted specimen  and confirmed on repeat testing.  While 2019 novel coronavirus  (SARS-CoV-2) nucleic acids may be present in the submitted sample  additional confirmatory testing may be necessary for epidemiological  and / or clinical management purposes  to differentiate between  SARS-CoV-2 and other Sarbecovirus currently known to infect humans.  If clinically indicated additional testing with an alternate test  methodology [phone]) is advised. The SARS-CoV-2 RNA is generally  detectable in upper and lower respiratory sp ecimens during the acute  phase of infection. The expected result is Negative. Fact Sheet for Patients:  StrictlyIdeas.no Fact Sheet for Healthcare Providers: BankingDealers.co.za This test is not yet approved or cleared by the [place] FDA and has been authorized for detection  and/or diagnosis of SARS-CoV-2 by FDA under an Emergency Use Authorization (EUA).  This EUA will remain in effect (meaning this test can be used) for the duration of the COVID-19 declaration under Section 564(b)(1) of the Act, 21 U.S.C. section 360bbb-3(b)(1), unless the authorization is terminated or revoked sooner. Performed at [facility], [place] [address], [place], [place] [address]   Aerobic/Anaerobic Culture (surgical/deep wound)     Status: None (Preliminary result)   Collection Time: 04/15/19  9:28 PM   Specimen: Wound  Result Value Ref Range Status   Specimen Description WOUND RIGHT FOREARM  Final   Special Requests PT ON VANC  Final   Gram Stain   Final    RARE WBC PRESENT, PREDOMINANTLY PMN FEW GRAM POSITIVE COCCI Performed at [facility], [place] [address], [name], [place] [address]    Culture   Final    MODERATE STAPHYLOCOCCUS AUREUS NO ANAEROBES ISOLATED; CULTURE IN PROGRESS FOR 5 DAYS    Report Status PENDING  Incomplete   Organism ID, Bacteria STAPHYLOCOCCUS AUREUS  Final      Susceptibility   Staphylococcus aureus - MIC*    CIPROFLOXACIN <=0.5 SENSITIVE Sensitive     ERYTHROMYCIN <=0.25 SENSITIVE Sensitive     GENTAMICIN <=0.5 SENSITIVE Sensitive     OXACILLIN <=0.25 SENSITIVE Sensitive     TETRACYCLINE <=1 SENSITIVE Sensitive     VANCOMYCIN <=0.5 SENSITIVE Sensitive     TRIMETH/SULFA <=10 SENSITIVE Sensitive     CLINDAMYCIN <=0.25 SENSITIVE Sensitive     RIFAMPIN <=0.5 SENSITIVE Sensitive     Inducible Clindamycin NEGATIVE Sensitive     * MODERATE STAPHYLOCOCCUS AUREUS  Culture, blood (routine x 2)     Status: None (Preliminary result)   Collection Time: 04/16/19  9:09 AM   Specimen: BLOOD  Result Value Ref Range Status  Specimen Description BLOOD LEFT ANTECUBITAL  Final   Special Requests   Final    BOTTLES DRAWN AEROBIC AND ANAEROBIC Blood Culture adequate volume   Culture   Final    NO GROWTH 1 DAY Performed at [facility],  [address] [place], [place] [address]    Report Status PENDING  Incomplete  Culture, blood (routine x 2)     Status: None (Preliminary result)   Collection Time: 04/16/19  9:15 AM   Specimen: BLOOD RIGHT HAND  Result Value Ref Range Status   Specimen Description BLOOD RIGHT HAND  Final   Special Requests   Final    BOTTLES DRAWN AEROBIC ONLY Blood Culture adequate volume   Culture   Final    NO GROWTH 1 DAY Performed at [facility], [place] [address] [place], [place] [address]    Report Status PENDING  Incomplete         Radiology Studies: No results found.      Scheduled Meds: . amLODipine  10 mg Oral QHS  . aspirin EC  81 mg Oral Daily  . Chlorhexidine Gluconate Cloth  6 each Topical Q0600  . ferric citrate  630 mg Oral TID WC  . heparin  5,000 Units Subcutaneous Q8H  . hydrALAZINE  100 mg Oral TID  . labetalol  300 mg Oral TID  . sevelamer carbonate  800 mg Oral TID WC  . sodium chloride flush  3 mL Intravenous Q12H   Continuous Infusions: . sodium chloride Stopped (04/15/19 2215)  . sodium chloride Stopped (04/15/19 2215)  .  ceFAZolin (ANCEF) IV Stopped (04/17/19 0712)     LOS: 3 days    Time spent: 25 Minutes.    Dana Allan, MD  [facility] Pager #: [phone] 7PM-7AM contact night coverage as above

## 2019-04-17 NOTE — Progress Notes (Signed)
    [facility] has been requested to perform a transesophageal echocardiogram on Clovis Fredrickson for bacteremia.  After careful review of history and examination, the risks and benefits of transesophageal echocardiogram have been explained including risks of esophageal damage, perforation (1:10,000 risk), bleeding, pharyngeal hematoma as well as other potential complications associated with conscious sedation including aspiration, arrhythmia, respiratory failure and death. Alternatives to treatment were discussed, questions were answered. Patient is willing to proceed.   Lyda Jester, PA-C  04/17/2019 7:28 PM

## 2019-04-17 NOTE — Progress Notes (Signed)
[facility] Progress Note  Subjective: on HD this am, no new c/o's, afeb overnight  Vitals:   04/17/19 1000 04/17/19 1030 04/17/19 1040 04/17/19 1202  BP: (!) 149/88 (!) 146/88 (!) 166/93 (!) 155/88  Pulse: 78 69 68 75  Resp:   17 18  Temp:   98.5 F (36.9 C) 97.8 F (36.6 C)  TempSrc:   Oral Oral  SpO2:   97% 94%  Weight:      Height:        Inpatient medications: . amLODipine  10 mg Oral QHS  . aspirin EC  81 mg Oral Daily  . Chlorhexidine Gluconate Cloth  6 each Topical Q0600  . ferric citrate  630 mg Oral TID WC  . heparin  5,000 Units Subcutaneous Q8H  . hydrALAZINE  100 mg Oral TID  . labetalol  300 mg Oral TID  . sevelamer carbonate  800 mg Oral TID WC  . sodium chloride flush  3 mL Intravenous Q12H   . sodium chloride Stopped (04/15/19 2215)  . sodium chloride Stopped (04/15/19 2215)  .  ceFAZolin (ANCEF) IV Stopped (04/17/19 0277)   sodium chloride, acetaminophen, albuterol, ferric citrate, HYDROcodone-acetaminophen, ondansetron **OR** ondansetron (ZOFRAN) IV, sodium chloride flush    Exam: GEN: No acute distress, appears unwell, still ENT: NCAT EYES: EOMI CV: RRR, normal S1/S2, no murmur gallop or rub PULM: Clear bilaterally, normal work of breathing ABD: Soft, mild tenderness to palpation throughout, no rebound or guarding, quiet bowel sounds SKIN: No rashes or lesions VASCULAR: Right forearm AV graft with normal bruit and thrill, no identified ulcer/scab/bruising, dressing was not removed EXT: No peripheral edema   Outpt HD: [facility]  4h 93min  86kg   2/2.25 bath  450/1.5  P2  Hep 3000   RFA AVG  - mircera 50 ug last 6/2, is on hold currently  - calcitriol 1.5 ug tiw     Assessment/ Plan: 1. MSSA bacteremia/ infected AVG w/ bleed:  6/23 sp partial resection w/ revision of AVG. Abx per ID.  2. ESRD - HD TTS. HD today, stick the most medial limb of the AVG for now 3. HTN - cont meds 4. Hep C 5. H/o CVA 6. H/o depression 7. Anemia  ckd - not on esa now at op center 8. Dispo - pending, possibly today    [facility] 04/17/2019, 1:51 PM  Iron/TIBC/Ferritin/ %Sat    Component Value Date/Time   IRON 50 08/03/2017 1028   TIBC 260 08/03/2017 1028   FERRITIN 305 08/03/2017 1028   IRONPCTSAT 19 08/03/2017 1028   Recent Labs  Lab 04/14/19 1420  04/17/19 0750  NA 131*   < > 127*  K 6.3*   < > 4.8  CL 86*   < > 87*  CO2 21*   < > 20*  GLUCOSE 113*   < > 109*  BUN 80*   < > 108*  CREATININE 15.82*   < > 14.95*  CALCIUM 9.3   < > 8.9  PHOS  --    < > 4.8*  ALBUMIN 3.4*   < > 2.7*  INR 1.2  --   --    < > = values in this interval not displayed.   Recent Labs  Lab 04/14/19 1420  AST 27  ALT 13  ALKPHOS 75  BILITOT 0.6  PROT 7.3   Recent Labs  Lab 04/17/19 0750  WBC 9.1  HGB 9.9*  HCT 28.3*  PLT  109*      

## 2019-04-17 NOTE — Progress Notes (Signed)
[facility], with [facility] team [place], PCP is [facility], Percell Locus is CSW for discharge phone [phone] ext [phone]

## 2019-04-18 ENCOUNTER — Inpatient Hospital Stay (HOSPITAL_COMMUNITY): Payer: No Typology Code available for payment source

## 2019-04-18 ENCOUNTER — Encounter (HOSPITAL_COMMUNITY)
Admission: EM | Disposition: A | Discharge status: Home / Self Care | Payer: Self-pay | Source: Home / Self Care | Attending: Internal Medicine

## 2019-04-18 ENCOUNTER — Encounter (HOSPITAL_COMMUNITY): Payer: Self-pay | Admitting: Cardiology

## 2019-04-18 DIAGNOSIS — I700 Atherosclerosis of aorta: Secondary | ICD-10-CM

## 2019-04-18 DIAGNOSIS — R7881 Bacteremia: Secondary | ICD-10-CM

## 2019-04-18 HISTORY — PX: TEE WITHOUT CARDIOVERSION: SHX5443

## 2019-04-18 LAB — BASIC METABOLIC PANEL
Anion gap: 17 — ABNORMAL HIGH (ref 5–15)
BUN: 71 mg/dL — ABNORMAL HIGH (ref 6–20)
CO2: 22 mmol/L (ref 22–32)
Calcium: 8.8 mg/dL — ABNORMAL LOW (ref 8.9–10.3)
Chloride: 91 mmol/L — ABNORMAL LOW (ref 98–111)
Creatinine, Ser: 11.47 mg/dL — ABNORMAL HIGH (ref 0.61–1.24)
GFR calc Af Amer: 5 mL/min — ABNORMAL LOW (ref >60)
GFR calc non Af Amer: 4 mL/min — ABNORMAL LOW (ref >60)
Glucose, Bld: 91 mg/dL (ref 70–99)
Potassium: 4.5 mmol/L (ref 3.5–5.1)
Sodium: 130 mmol/L — ABNORMAL LOW (ref 135–145)

## 2019-04-18 SURGERY — ECHOCARDIOGRAM, TRANSESOPHAGEAL
Anesthesia: Moderate Sedation

## 2019-04-18 MED ORDER — BUTAMBEN-TETRACAINE-BENZOCAINE 2-2-14 % EX AERO
INHALATION_SPRAY | CUTANEOUS | Status: DC | PRN
  Administered 2019-04-18: 13:00:00 2 via TOPICAL

## 2019-04-18 MED ORDER — FENTANYL CITRATE (PF) 100 MCG/2ML IJ SOLN
INTRAMUSCULAR | Status: AC
  Filled 2019-04-18: qty 2

## 2019-04-18 MED ORDER — OXYCODONE HCL 5 MG PO TABS: 5.0000 mg | ORAL_TABLET | Freq: Once | ORAL | Status: DC | PRN

## 2019-04-18 MED ORDER — FENTANYL CITRATE (PF) 100 MCG/2ML IJ SOLN
INTRAMUSCULAR | Status: DC | PRN
  Administered 2019-04-18 (×2): 25 ug via INTRAVENOUS

## 2019-04-18 MED ORDER — FENTANYL CITRATE (PF) 100 MCG/2ML IJ SOLN: 25.0000 ug | INTRAMUSCULAR | Status: DC | PRN

## 2019-04-18 MED ORDER — SEVELAMER CARBONATE 800 MG PO TABS: 800.0000 mg | ORAL_TABLET | Freq: Three times a day (TID) | ORAL | 0 refills | Status: AC

## 2019-04-18 MED ORDER — MIDAZOLAM HCL (PF) 10 MG/2ML IJ SOLN
INTRAMUSCULAR | Status: DC | PRN
  Administered 2019-04-18 (×2): 2 mg via INTRAVENOUS

## 2019-04-18 MED ORDER — DIPHENHYDRAMINE HCL 50 MG/ML IJ SOLN
INTRAMUSCULAR | Status: AC
  Filled 2019-04-18: qty 1

## 2019-04-18 MED ORDER — OXYCODONE HCL 5 MG/5ML PO SOLN: 5.0000 mg | Freq: Once | ORAL | Status: DC | PRN

## 2019-04-18 MED ORDER — SODIUM CHLORIDE 0.9 % IV SOLN
INTRAVENOUS | Status: DC
  Administered 2019-04-18: 08:00:00 via INTRAVENOUS

## 2019-04-18 MED ORDER — ONDANSETRON HCL 4 MG/2ML IJ SOLN: 4.0000 mg | Freq: Once | INTRAMUSCULAR | Status: DC | PRN

## 2019-04-18 MED ORDER — MIDAZOLAM HCL (PF) 5 MG/ML IJ SOLN
INTRAMUSCULAR | Status: AC
  Filled 2019-04-18: qty 2

## 2019-04-18 NOTE — CV Procedure (Signed)
    Transesophageal Echocardiogram Note  Frank Fuller [id number] 06-28-1961  Procedure: Transesophageal Echocardiogram Indications: bacteremia   Procedure Details Consent: Obtained Time Out: Verified patient identification, verified procedure, site/side was marked, verified correct patient position, special equipment/implants available, Radiology Safety Procedures followed,  medications/allergies/relevent history reviewed, required imaging and test results available.  Performed  Medications:  During this procedure the patient is administered a total of Versed 4 mg and Fentanyl 50 mcg  to achieve and maintain moderate conscious sedation.  The patient's heart rate, blood pressure, and oxygen saturation are monitored continuously during the procedure. The period of conscious sedation is 30 minutes, of which I was present face-to-face 100% of this time.  Normal LV function; no vegetations   Complications: No apparent complications Patient did tolerate procedure well.  Kirk Ruths, MD

## 2019-04-18 NOTE — Progress Notes (Signed)
  Echocardiogram Echocardiogram Transesophageal has been performed.  Jolissa Kapral L Androw 04/18/2019, 1:39 PM

## 2019-04-18 NOTE — Discharge Summary (Signed)
Physician Discharge Summary  Patient ID: Frank Fuller MRN: [id number] DOB/AGE: [birth year] 58 y.o.  Admit date: 04/14/2019 Discharge date: 04/18/2019  Admission Diagnoses:  Discharge Diagnoses:  Principal Problem:   MSSA bacteremia Active Problems:   Possibly infected hemodialysis AV graft.   ESRD on hemodialysis   Discharged Condition: stable  Hospital Course: Patient is a 58 year old African-American male with past medical history significant for end-stage renal disease on hemodialysis, diabetes mellitus and CVA.  Patient was admitted with fever.  Apparently, patient developed fever, chills our adult was on hemodialysis.  Patient was admitted to the hospital from the hemodialysis unit.  Work-up done revealed MSSA sepsis/bacteremia.  The source of the MSSA is thought to be right upper extremity AV graft.  Patient was initially admitted and started on broad-spectrum antibiotics.  Antibiotics was changed to IV Ancef on finalization of the blood cultures.  Vascular surgery team was consulted to assist with patient's management.  Patient underwent excision of segment of the AV graft.  Preliminary Gram stain from the AV graft is showing GPC's.  Infectious disease team was consulted to assist with further management.  Patient underwent TEE today that was negative for vegetations.  Patient will be discharged back on to the care of the primary care provider, infectious disease, nephrology and vascular surgery team.  Patient will complete 6 weeks of IV Ancef.  Dr. Roney Jaffe, and nephrologist will arrange IV antibiotics with hemodialysis.    Bacteremia/sepsis secondary to MSSA: Complete 6 weeks course of IV cefazolin.   TEE was negative for vegetation  ESRD on hemodialysis: IV Ancef with hemodialysis on discharge.   Nephrology team directed hemodialysis management during the hospital stay.    Elevated troponin: Likely type II elevation. No chest pain.  Hypertension:  Continue to  optimize.  History of hep C and CVA.   Consults: ID, nephrology and vascular surgery  Significant Diagnostic Studies:  Blood culture grew MSSA. Specimen taken from the excised portion of AV graft reveals GPC.  Treatments:  IV Ancef  Discharge Exam: Blood pressure (!) 160/84, pulse 68, temperature 98 F (36.7 C), temperature source Temporal, resp. rate 13, height 6' 3.5" (1.918 m), weight 91.5 kg, SpO2 98 %.   Disposition: Discharge disposition: 01-Home or Self Care      Discharge Instructions    Diet - low sodium heart healthy   Complete by: As directed    Increase activity slowly   Complete by: As directed      Allergies as of 04/18/2019      Reactions   Chlorhexidine Other (See Comments)   Unknown reaction Patch skin test done at dialysis 06/26/17  - staff using clear dressing and alcohol to clean exit site of catheter   Clonidine Derivatives Other (See Comments)   unresponsive   Lisinopril Other (See Comments)   unresponsive   Carvedilol Rash      Medication List    STOP taking these medications   ondansetron 4 MG disintegrating tablet Commonly known as: Zofran ODT   sevelamer 800 MG tablet Commonly known as: RENAGEL Replaced by: sevelamer carbonate 800 MG tablet     TAKE these medications   acetaminophen 500 MG tablet Commonly known as: TYLENOL Take 1,000 mg by mouth every 6 (six) hours as needed for fever or headache (pain).   amLODipine 10 MG tablet Commonly known as: NORVASC Take 10 mg by mouth at bedtime.   aspirin EC 81 MG tablet Take 81 mg by mouth every evening.   Auryxia 1  GM 210 MG(Fe) tablet Generic drug: ferric citrate Take 420-630 mg by mouth See admin instructions. Take 3 tablets (630 mg) by mouth three times daily with meals, take 2 tablets (420 mg) with snacks   Ensure Take 237 mLs by mouth 2 (two) times a day.   hydrALAZINE 100 MG tablet Commonly known as: APRESOLINE Take 100 mg by mouth 3 (three) times daily. Hold for systolic  blood pressure 295/62   HYDROcodone-acetaminophen 5-325 MG tablet Commonly known as: Norco Take 1 tablet by mouth every 6 (six) hours as needed for moderate pain.   labetalol 300 MG tablet Commonly known as: NORMODYNE Take 300 mg by mouth 3 (three) times daily.   lidocaine-prilocaine cream Commonly known as: EMLA Apply 1 application topically See admin instructions. Apply topically three times week (Tuesday, Thursday, Saturday) prior to port access   sevelamer carbonate 800 MG tablet Commonly known as: RENVELA Take 1 tablet (800 mg total) by mouth 3 (three) times daily with meals for 30 days. Replaces: sevelamer 800 MG tablet      Follow-up Information    Carlyle Basques, MD Follow up.   Specialty: Infectious Diseases Why: 7/27 @ 10:30am. Please call to reschedule if you are unable to make appointment.  Contact information: [address] [place] [address] [phone]           Signed: Bonnell Public 04/18/2019, 3:00 PM

## 2019-04-18 NOTE — Care Management Important Message (Signed)
Important Message  Patient Details  Name: Frank Fuller MRN: [id number] Date of Birth: 1961/08/09   Medicare Important Message Given:  Yes     Horatio Bertz Montine Circle 04/18/2019, 2:56 PM

## 2019-04-18 NOTE — Progress Notes (Signed)
[facility] for Infectious Disease  Date of Admission:  04/14/2019     Total days of antibiotics 5         ASSESSMENT/PLAN  Mr. Wilford is a 58 year old male admitted with fever, abdominal pain and nausea with concern for pneumonia on CT scan and found to have MSSA bacteremia with infection of his dialysis graft located in his right forearm now status post replacement.  No evidence of pneumonia on clinical exam.  TEE completed with no evidence of endocarditis.  Repeat blood cultures without growth to date.  Okay to discharge from ID perspective  MSSA bacteremia -repeat cultures without growth to date.  Most likely source being his right forearm infected AV graft.  Plan for 6 weeks of antimicrobial therapy with Ancef to be completed with dialysis.  Infected dialysis graft status post replacement -postop day 3 with improving swelling and discomfort.  Healing well with no obvious signs of infection.  Continue wound care per vascular surgery.   Diagnosis: MSSA Bacteremia / Infected graft  Culture Result: MSSA   Allergies  Allergen Reactions  . Chlorhexidine Other (See Comments)    Unknown reaction Patch skin test done at dialysis 06/26/17  - staff using clear dressing and alcohol to clean exit site of catheter  . Clonidine Derivatives Other (See Comments)    unresponsive  . Lisinopril Other (See Comments)    unresponsive  . Carvedilol Rash    OPAT Orders Discharge antibiotics: Ancef with dialysis Per pharmacy protocol   Duration: 6 weeks  End Date: May 28, 2019   [facility] Care Per Protocol:  Labs weekly while on IV antibiotics: _X_ CBC with differential _X_ BMP __ CMP __ CRP __ ESR __ Vancomycin trough __ CK  __ Please pull PIC at completion of IV antibiotics __ Please leave PIC in place until doctor has seen patient or been notified  Fax weekly labs to [phone]  Clinic Follow Up Appt:  05/19/2019 with Dr. Baxter Flattery   Principal Problem:   MSSA  bacteremia Active Problems:   Pneumonia   Hospital-acquired pneumonia   . amLODipine  10 mg Oral QHS  . aspirin EC  81 mg Oral Daily  . Chlorhexidine Gluconate Cloth  6 each Topical Q0600  . ferric citrate  630 mg Oral TID WC  . heparin  5,000 Units Subcutaneous Q8H  . hydrALAZINE  100 mg Oral TID  . labetalol  300 mg Oral TID  . sevelamer carbonate  800 mg Oral TID WC  . sodium chloride flush  3 mL Intravenous Q12H    SUBJECTIVE:  Afebrile overnight with no new concerns. Right forearm is feeling better. Ready to go home.   Allergies  Allergen Reactions  . Chlorhexidine Other (See Comments)    Unknown reaction Patch skin test done at dialysis 06/26/17  - staff using clear dressing and alcohol to clean exit site of catheter  . Clonidine Derivatives Other (See Comments)    unresponsive  . Lisinopril Other (See Comments)    unresponsive  . Carvedilol Rash     Review of Systems: Review of Systems  Constitutional: Negative for chills, fever and weight loss.  Respiratory: Negative for cough, shortness of breath and wheezing.   Cardiovascular: Negative for chest pain and leg swelling.  Gastrointestinal: Negative for abdominal pain, constipation, diarrhea, nausea and vomiting.  Skin: Negative for rash.      OBJECTIVE: Vitals:   04/18/19 1334 04/18/19 1340 04/18/19 1350 04/18/19 1401  BP: (!) 150/98 Marland Kitchen)  149/85 (!) (P) 146/96 (!) 160/84  Pulse: 70 (!) 57 72 68  Resp: '17 19 16 13  ' Temp:  98 F (36.7 C)    TempSrc:  Temporal    SpO2: 100% 100% 98% 98%  Weight:      Height:       Body mass index is 24.88 kg/m.  Physical Exam Constitutional:      General: He is not in acute distress.    Appearance: He is well-developed.  Cardiovascular:     Rate and Rhythm: Normal rate and regular rhythm.     Heart sounds: Normal heart sounds.  Pulmonary:     Effort: Pulmonary effort is normal.     Breath sounds: Normal breath sounds.  Skin:    General: Skin is warm and dry.   Neurological:     Mental Status: He is alert.  Psychiatric:        Mood and Affect: Mood normal.        Thought Content: Thought content normal.        Judgment: Judgment normal.     Lab Results Lab Results  Component Value Date   WBC 9.1 04/17/2019   HGB 9.9 (L) 04/17/2019   HCT 28.3 (L) 04/17/2019   MCV 89.8 04/17/2019   PLT 109 (L) 04/17/2019    Lab Results  Component Value Date   CREATININE 11.47 (H) 04/18/2019   BUN 71 (H) 04/18/2019   NA 130 (L) 04/18/2019   K 4.5 04/18/2019   CL 91 (L) 04/18/2019   CO2 22 04/18/2019    Lab Results  Component Value Date   ALT 13 04/14/2019   AST 27 04/14/2019   ALKPHOS 75 04/14/2019   BILITOT 0.6 04/14/2019     Microbiology: Recent Results (from the past 240 hour(s))  SARS Coronavirus 2 (CEPHEID- Performed in [facility] hospital lab), Hosp Order     Status: None   Collection Time: 04/12/19 10:50 AM   Specimen: Nasopharyngeal Swab  Result Value Ref Range Status   SARS Coronavirus 2 NEGATIVE NEGATIVE Final    Comment: (NOTE) If result is NEGATIVE SARS-CoV-2 target nucleic acids are NOT DETECTED. The SARS-CoV-2 RNA is generally detectable in upper and lower  respiratory specimens during the acute phase of infection. The lowest  concentration of SARS-CoV-2 viral copies this assay can detect is 250  copies / mL. A negative result does not preclude SARS-CoV-2 infection  and should not be used as the sole basis for treatment or other  patient management decisions.  A negative result may occur with  improper specimen collection / handling, submission of specimen other  than nasopharyngeal swab, presence of viral mutation(s) within the  areas targeted by this assay, and inadequate number of viral copies  (<250 copies / mL). A negative result must be combined with clinical  observations, patient history, and epidemiological information. If result is POSITIVE SARS-CoV-2 target nucleic acids are DETECTED. The SARS-CoV-2 RNA is  generally detectable in upper and lower  respiratory specimens dur ing the acute phase of infection.  Positive  results are indicative of active infection with SARS-CoV-2.  Clinical  correlation with patient history and other diagnostic information is  necessary to determine patient infection status.  Positive results do  not rule out bacterial infection or co-infection with other viruses. If result is PRESUMPTIVE POSTIVE SARS-CoV-2 nucleic acids MAY BE PRESENT.   A presumptive positive result was obtained on the submitted specimen  and confirmed on repeat testing.  While 2019  novel coronavirus  (SARS-CoV-2) nucleic acids may be present in the submitted sample  additional confirmatory testing may be necessary for epidemiological  and / or clinical management purposes  to differentiate between  SARS-CoV-2 and other Sarbecovirus currently known to infect humans.  If clinically indicated additional testing with an alternate test  methodology [phone]) is advised. The SARS-CoV-2 RNA is generally  detectable in upper and lower respiratory sp ecimens during the acute  phase of infection. The expected result is Negative. Fact Sheet for Patients:  StrictlyIdeas.no Fact Sheet for Healthcare Providers: BankingDealers.co.za This test is not yet approved or cleared by the [place] FDA and has been authorized for detection and/or diagnosis of SARS-CoV-2 by FDA under an Emergency Use Authorization (EUA).  This EUA will remain in effect (meaning this test can be used) for the duration of the COVID-19 declaration under Section 564(b)(1) of the Act, 21 U.S.C. section 360bbb-3(b)(1), unless the authorization is terminated or revoked sooner. Performed at [facility], [place] [address], [place], [place] [address]   Culture, blood (routine x 2)     Status: Abnormal   Collection Time: 04/14/19  2:10 PM   Specimen: BLOOD  Result Value Ref Range  Status   Specimen Description BLOOD LEFT WRIST  Final   Special Requests   Final    BOTTLES DRAWN AEROBIC AND ANAEROBIC Blood Culture adequate volume   Culture  Setup Time   Final    IN BOTH AEROBIC AND ANAEROBIC BOTTLES GRAM POSITIVE COCCI CRITICAL RESULT CALLED TO, READ BACK BY AND VERIFIED WITH: G. ABBOTT,PHARMD 0301 04/15/2019 Mena Goes Performed at [facility], [place] [address] [place], [place] [address]    Culture STAPHYLOCOCCUS AUREUS (A)  Final   Report Status 04/17/2019 FINAL  Final   Organism ID, Bacteria STAPHYLOCOCCUS AUREUS  Final      Susceptibility   Staphylococcus aureus - MIC*    CIPROFLOXACIN <=0.5 SENSITIVE Sensitive     ERYTHROMYCIN <=0.25 SENSITIVE Sensitive     GENTAMICIN <=0.5 SENSITIVE Sensitive     OXACILLIN <=0.25 SENSITIVE Sensitive     TETRACYCLINE <=1 SENSITIVE Sensitive     VANCOMYCIN 1 SENSITIVE Sensitive     TRIMETH/SULFA <=10 SENSITIVE Sensitive     CLINDAMYCIN <=0.25 SENSITIVE Sensitive     RIFAMPIN <=0.5 SENSITIVE Sensitive     Inducible Clindamycin NEGATIVE Sensitive     * STAPHYLOCOCCUS AUREUS  Blood Culture ID Panel (Reflexed)     Status: Abnormal   Collection Time: 04/14/19  2:10 PM  Result Value Ref Range Status   Enterococcus species NOT DETECTED NOT DETECTED Final   Listeria monocytogenes NOT DETECTED NOT DETECTED Final   Staphylococcus species DETECTED (A) NOT DETECTED Final    Comment: CRITICAL RESULT CALLED TO, READ BACK BY AND VERIFIED WITH: G. ABBOTT,PHARMD 0301 04/15/2019 T. TYSOR    Staphylococcus aureus (BCID) DETECTED (A) NOT DETECTED Final    Comment: Methicillin (oxacillin) susceptible Staphylococcus aureus (MSSA). Preferred therapy is anti staphylococcal beta lactam antibiotic (Cefazolin or Nafcillin), unless clinically contraindicated. CRITICAL RESULT CALLED TO, READ BACK BY AND VERIFIED WITH: G. ABBOTT,PHARMD 0301 04/15/2019 T. TYSOR    Methicillin resistance NOT DETECTED NOT DETECTED Final   Streptococcus species  NOT DETECTED NOT DETECTED Final   Streptococcus agalactiae NOT DETECTED NOT DETECTED Final   Streptococcus pneumoniae NOT DETECTED NOT DETECTED Final   Streptococcus pyogenes NOT DETECTED NOT DETECTED Final   Acinetobacter baumannii NOT DETECTED NOT DETECTED Final   Enterobacteriaceae species NOT DETECTED NOT DETECTED  Final   Enterobacter cloacae complex NOT DETECTED NOT DETECTED Final   Escherichia coli NOT DETECTED NOT DETECTED Final   Klebsiella oxytoca NOT DETECTED NOT DETECTED Final   Klebsiella pneumoniae NOT DETECTED NOT DETECTED Final   Proteus species NOT DETECTED NOT DETECTED Final   Serratia marcescens NOT DETECTED NOT DETECTED Final   Haemophilus influenzae NOT DETECTED NOT DETECTED Final   Neisseria meningitidis NOT DETECTED NOT DETECTED Final   Pseudomonas aeruginosa NOT DETECTED NOT DETECTED Final   Candida albicans NOT DETECTED NOT DETECTED Final   Candida glabrata NOT DETECTED NOT DETECTED Final   Candida krusei NOT DETECTED NOT DETECTED Final   Candida parapsilosis NOT DETECTED NOT DETECTED Final   Candida tropicalis NOT DETECTED NOT DETECTED Final    Comment: Performed at [facility], [place] [address] [place], [place] [address]  Culture, blood (routine x 2)     Status: Abnormal   Collection Time: 04/14/19  2:20 PM   Specimen: BLOOD  Result Value Ref Range Status   Specimen Description BLOOD BLOOD LEFT FOREARM  Final   Special Requests   Final    BOTTLES DRAWN AEROBIC AND ANAEROBIC Blood Culture results may not be optimal due to an inadequate volume of blood received in culture bottles   Culture  Setup Time   Final    GRAM POSITIVE COCCI IN CLUSTERS IN BOTH AEROBIC AND ANAEROBIC BOTTLES CRITICAL VALUE NOTED.  VALUE IS CONSISTENT WITH PREVIOUSLY REPORTED AND CALLED VALUE.    Culture (A)  Final    STAPHYLOCOCCUS AUREUS SUSCEPTIBILITIES PERFORMED ON PREVIOUS CULTURE WITHIN THE LAST 5 DAYS. Performed at [facility], [place] [address], [place],   [address]    Report Status 04/17/2019 FINAL  Final  SARS Coronavirus 2 (CEPHEID - Performed in [facility] hospital lab), Hosp Order     Status: None   Collection Time: 04/14/19  2:29 PM   Specimen: Nasopharyngeal Swab  Result Value Ref Range Status   SARS Coronavirus 2 NEGATIVE NEGATIVE Final    Comment: (NOTE) If result is NEGATIVE SARS-CoV-2 target nucleic acids are NOT DETECTED. The SARS-CoV-2 RNA is generally detectable in upper and lower  respiratory specimens during the acute phase of infection. The lowest  concentration of SARS-CoV-2 viral copies this assay can detect is 250  copies / mL. A negative result does not preclude SARS-CoV-2 infection  and should not be used as the sole basis for treatment or other  patient management decisions.  A negative result may occur with  improper specimen collection / handling, submission of specimen other  than nasopharyngeal swab, presence of viral mutation(s) within the  areas targeted by this assay, and inadequate number of viral copies  (<250 copies / mL). A negative result must be combined with clinical  observations, patient history, and epidemiological information. If result is POSITIVE SARS-CoV-2 target nucleic acids are DETECTED. The SARS-CoV-2 RNA is generally detectable in upper and lower  respiratory specimens dur ing the acute phase of infection.  Positive  results are indicative of active infection with SARS-CoV-2.  Clinical  correlation with patient history and other diagnostic information is  necessary to determine patient infection status.  Positive results do  not rule out bacterial infection or co-infection with other viruses. If result is PRESUMPTIVE POSTIVE SARS-CoV-2 nucleic acids MAY BE PRESENT.   A presumptive positive result was obtained on the submitted specimen  and confirmed on repeat testing.  While 2019 novel coronavirus  (SARS-CoV-2) nucleic acids may be present  in the submitted sample  additional confirmatory  testing may be necessary for epidemiological  and / or clinical management purposes  to differentiate between  SARS-CoV-2 and other Sarbecovirus currently known to infect humans.  If clinically indicated additional testing with an alternate test  methodology [phone]) is advised. The SARS-CoV-2 RNA is generally  detectable in upper and lower respiratory sp ecimens during the acute  phase of infection. The expected result is Negative. Fact Sheet for Patients:  StrictlyIdeas.no Fact Sheet for Healthcare Providers: BankingDealers.co.za This test is not yet approved or cleared by the [place] FDA and has been authorized for detection and/or diagnosis of SARS-CoV-2 by FDA under an Emergency Use Authorization (EUA).  This EUA will remain in effect (meaning this test can be used) for the duration of the COVID-19 declaration under Section 564(b)(1) of the Act, 21 U.S.C. section 360bbb-3(b)(1), unless the authorization is terminated or revoked sooner. Performed at [facility], [place] [address] [place], [place] [address]   Aerobic/Anaerobic Culture (surgical/deep wound)     Status: None (Preliminary result)   Collection Time: 04/15/19  9:28 PM   Specimen: Wound  Result Value Ref Range Status   Specimen Description WOUND RIGHT FOREARM  Final   Special Requests PT ON VANC  Final   Gram Stain   Final    RARE WBC PRESENT, PREDOMINANTLY PMN FEW GRAM POSITIVE COCCI Performed at [facility], [place] [address] [place], [place] [address]    Culture   Final    MODERATE STAPHYLOCOCCUS AUREUS NO ANAEROBES ISOLATED; CULTURE IN PROGRESS FOR 5 DAYS    Report Status PENDING  Incomplete   Organism ID, Bacteria STAPHYLOCOCCUS AUREUS  Final      Susceptibility   Staphylococcus aureus - MIC*    CIPROFLOXACIN <=0.5 SENSITIVE Sensitive     ERYTHROMYCIN <=0.25 SENSITIVE Sensitive     GENTAMICIN <=0.5 SENSITIVE Sensitive     OXACILLIN <=0.25  SENSITIVE Sensitive     TETRACYCLINE <=1 SENSITIVE Sensitive     VANCOMYCIN <=0.5 SENSITIVE Sensitive     TRIMETH/SULFA <=10 SENSITIVE Sensitive     CLINDAMYCIN <=0.25 SENSITIVE Sensitive     RIFAMPIN <=0.5 SENSITIVE Sensitive     Inducible Clindamycin NEGATIVE Sensitive     * MODERATE STAPHYLOCOCCUS AUREUS  Culture, blood (routine x 2)     Status: None (Preliminary result)   Collection Time: 04/16/19  9:09 AM   Specimen: BLOOD  Result Value Ref Range Status   Specimen Description BLOOD LEFT ANTECUBITAL  Final   Special Requests   Final    BOTTLES DRAWN AEROBIC AND ANAEROBIC Blood Culture adequate volume   Culture   Final    NO GROWTH 2 DAYS Performed at [facility], [address] [place], [place] [address]    Report Status PENDING  Incomplete  Culture, blood (routine x 2)     Status: None (Preliminary result)   Collection Time: 04/16/19  9:15 AM   Specimen: BLOOD RIGHT HAND  Result Value Ref Range Status   Specimen Description BLOOD RIGHT HAND  Final   Special Requests   Final    BOTTLES DRAWN AEROBIC ONLY Blood Culture adequate volume   Culture   Final    NO GROWTH 2 DAYS Performed at [facility], [place] [address], [place], [place] [address]    Report Status PENDING  Incomplete     Terri Piedra, [place] [facility] [phone] Pager  04/18/2019  2:30 PM

## 2019-04-18 NOTE — Interval H&P Note (Signed)
History and Physical Interval Note:  04/18/2019 12:59 PM  Frank Fuller  has presented today for surgery, with the diagnosis of BACTEREMIA.  The various methods of treatment have been discussed with the patient and family. After consideration of risks, benefits and other options for treatment, the patient has consented to  Procedure(s): TRANSESOPHAGEAL ECHOCARDIOGRAM (TEE) (N/A) as a surgical intervention.  The patient's history has been reviewed, patient examined, no change in status, stable for surgery.  I have reviewed the patient's chart and labs.  Questions were answered to the patient's satisfaction.     Kirk Ruths

## 2019-04-18 NOTE — Progress Notes (Signed)
Renal Navigator notified OP HD clinic/[place] of patient's plan for discharge today to provide continuity of care.  Alphonzo Cruise, [place] Renal Navigator [phone]

## 2019-04-18 NOTE — Progress Notes (Signed)
Pharmacy Antibiotic Note  Frank Fuller is a 58 y.o. male admitted on 04/14/2019 with MSSA bacteremia likely 2/2 infected HD graft.  Pharmacy has been consulted for Cefazolin dosing. Pt has ESRD on TTS but with need for procedures there was concern schedule may be labile so dosing 1g q24h - last HD 6/25. TEE planned today to evaluate for endocarditis. Repeat cultures NGTD.  Plan: -Continue cefazolin 1g IV q24h for now -Monitor HD schedule, if pt remains on schedule through weekend can consolidate to 2g IV qHD  Height: 6' 3.5" (191.8 cm) Weight: 201 lb 11.5 oz (91.5 kg) IBW/kg (Calculated) : 85.65  Temp (24hrs), Avg:98.3 F (36.8 C), Min:97.8 F (36.6 C), Max:98.8 F (37.1 C)  Recent Labs  Lab 04/12/19 1100 04/14/19 1410 04/14/19 1420 04/15/19 0840 04/16/19 0625 04/17/19 0750  WBC 8.2  --  8.3 8.2 8.1 9.1  CREATININE 7.68*  --  15.82* 11.70* 13.71* 14.95*  LATICACIDVEN  --  1.4  --   --   --   --     Estimated Creatinine Clearance: 6.4 mL/min (A) (by C-G formula based on SCr of 14.95 mg/dL (H)).    Allergies  Allergen Reactions  . Chlorhexidine Other (See Comments)    Unknown reaction Patch skin test done at dialysis 06/26/17  - staff using clear dressing and alcohol to clean exit site of catheter  . Clonidine Derivatives Other (See Comments)    unresponsive  . Lisinopril Other (See Comments)    unresponsive  . Carvedilol Rash    Antimicrobials this admission: Cefepime 6/22 x 1 Vanc 6/22 x 1 Cefazolin 6/23 >>  Dose adjustments this admission: n/a  Microbiology results: 6/22 COVID >> neg 6/22 BCx >> 2/2 GPC (BCID MSSA, panS) 6/23 R-AVG (intra-op) >> GPC on GS >> MSSA 6/24 BCx >> NGTD  Arrie Senate, PharmD, BCPS Clinical Pharmacist [phone] Please check AMION for all [name] numbers 04/18/2019

## 2019-04-18 NOTE — Progress Notes (Signed)
   VASCULAR SURGERY ASSESSMENT & PLAN:   3 Days Post-Op s/p: Revision of right forearm AV graft.  The graft has a good thrill and they were able to use the medial aspect of the graft for dialysis.  I will follow the wound on the lateral aspect of the arm in the office if he is discharged.  ID: His intraoperative culture, the hematoma, grew staph aureus sensitive to everything tested.  He is currently on IV Ancef.  Given that his revise segment of graft was adjacent to this wound he will need to continue on antibiotics.  ID is following.   SUBJECTIVE:   No complaints.  Anxious to go home.  PHYSICAL EXAM:   Vitals:   04/17/19 1202 04/17/19 1610 04/17/19 2322 04/18/19 0733  BP: (!) 155/88 (!) 148/81 (!) 154/88 (!) 176/101  Pulse: 75 70 75 82  Resp: 18 (!) 24  16  Temp: 97.8 F (36.6 C) 98 F (36.7 C) 98.8 F (37.1 C) 98.3 F (36.8 C)  TempSrc: Oral Oral Oral Oral  SpO2: 94% 94% 96% 97%  Weight:      Height:       His right forearm graft has a good thrill. Small amount of drainage from the incision where the hematoma was drained.  Dressing was changed.  LABS:   Lab Results  Component Value Date   WBC 9.1 04/17/2019   HGB 9.9 (L) 04/17/2019   HCT 28.3 (L) 04/17/2019   MCV 89.8 04/17/2019   PLT 109 (L) 04/17/2019   Lab Results  Component Value Date   CREATININE 11.47 (H) 04/18/2019   Lab Results  Component Value Date   INR 1.2 04/14/2019   CBG (last 3)  Recent Labs    04/15/19 1847  GLUCAP 107*    PROBLEM LIST:    Principal Problem:   MSSA bacteremia Active Problems:   Pneumonia   Hospital-acquired pneumonia   CURRENT MEDS:   . amLODipine  10 mg Oral QHS  . aspirin EC  81 mg Oral Daily  . Chlorhexidine Gluconate Cloth  6 each Topical Q0600  . ferric citrate  630 mg Oral TID WC  . heparin  5,000 Units Subcutaneous Q8H  . hydrALAZINE  100 mg Oral TID  . labetalol  300 mg Oral TID  . sevelamer carbonate  800 mg Oral TID WC  . sodium chloride flush  3  mL Intravenous Q12H    [name] Beeper: [phone] Office: [phone] 04/18/2019

## 2019-04-18 NOTE — Progress Notes (Signed)
[facility] Progress Note  Subjective: for TEE today  Vitals:   04/18/19 1334 04/18/19 1340 04/18/19 1350 04/18/19 1401  BP: (!) 150/98 (!) 149/85 (!) (P) 146/96 (!) 160/84  Pulse: 70 (!) 57 72 68  Resp: 17 19 16 13   Temp:  98 F (36.7 C)    TempSrc:  Temporal    SpO2: 100% 100% 98% 98%  Weight:      Height:        Inpatient medications: . amLODipine  10 mg Oral QHS  . aspirin EC  81 mg Oral Daily  . Chlorhexidine Gluconate Cloth  6 each Topical Q0600  . ferric citrate  630 mg Oral TID WC  . heparin  5,000 Units Subcutaneous Q8H  . hydrALAZINE  100 mg Oral TID  . labetalol  300 mg Oral TID  . sevelamer carbonate  800 mg Oral TID WC  . sodium chloride flush  3 mL Intravenous Q12H   . sodium chloride Stopped (04/15/19 2215)  . sodium chloride Stopped (04/15/19 2215)  .  ceFAZolin (ANCEF) IV Stopped (04/17/19 2137)   sodium chloride, acetaminophen, albuterol, ferric citrate, HYDROcodone-acetaminophen, ondansetron **OR** ondansetron (ZOFRAN) IV, sodium chloride flush    Exam: GEN: No acute distress, appears unwell, still ENT: NCAT EYES: EOMI CV: RRR, normal S1/S2, no murmur gallop or rub PULM: Clear bilaterally, normal work of breathing ABD: Soft, mild tenderness to palpation throughout, no rebound or guarding, quiet bowel sounds SKIN: No rashes or lesions VASCULAR: Right forearm AV graft with normal bruit and thrill, no identified ulcer/scab/bruising, dressing was not removed EXT: No peripheral edema   Outpt HD: TTS [facility]  4h 42min  86kg   2/2.25 bath  450/1.5  P2  Hep 3000   RFA AVG  - mircera 50 ug last 6/2, is on hold currently  - calcitriol 1.5 ug tiw     Assessment/ Plan: 1. MSSA bacteremia/ infected AVG w/ bleed:  6/23 sp partial resection w/ revision of AVG/ placement of new graft segment. Abx for 6 wks per ID.  For TEE today.  2. ESRD - HD TTS. HD today, stick the most medial limb of the AVG for now 3. HTN - cont meds 4. Hep C 5. H/o  CVA 6. H/o depression 7. Anemia ckd - not on esa now at op center 8. Dispo - pending results of TEE    [facility] 04/18/2019, 3:15 PM  Iron/TIBC/Ferritin/ %Sat    Component Value Date/Time   IRON 50 08/03/2017 1028   TIBC 260 08/03/2017 1028   FERRITIN 305 08/03/2017 1028   IRONPCTSAT 19 08/03/2017 1028   Recent Labs  Lab 04/14/19 1420  04/17/19 0750 04/18/19 0755  NA 131*   < > 127* 130*  K 6.3*   < > 4.8 4.5  CL 86*   < > 87* 91*  CO2 21*   < > 20* 22  GLUCOSE 113*   < > 109* 91  BUN 80*   < > 108* 71*  CREATININE 15.82*   < > 14.95* 11.47*  CALCIUM 9.3   < > 8.9 8.8*  PHOS  --    < > 4.8*  --   ALBUMIN 3.4*   < > 2.7*  --   INR 1.2  --   --   --    < > = values in this interval not displayed.   Recent Labs  Lab 04/14/19 1420  AST 27  ALT 13  ALKPHOS 75  BILITOT 0.6  PROT 7.3   Recent Labs  Lab 04/17/19 0750  WBC 9.1  HGB 9.9*  HCT 28.3*  PLT 109*

## 2019-04-18 NOTE — Progress Notes (Signed)
PHARMACY CONSULT NOTE FOR:  OUTPATIENT  PARENTERAL ANTIBIOTIC THERAPY (OPAT)  Indication: MSSA bacteremia Regimen: Cefazolin 2g IV qHD End date: 05/28/19  PLEASE NOTE: No antibiotic orders have been pended as pt can receive cefazolin 2g IV qHD at dialysis center. Please discuss with nephrology for outpatient plans prior to discharge.   Thank you for allowing pharmacy to be a part of this patient's care.   Arrie Senate, PharmD, BCPS Clinical Pharmacist [phone] Please check AMION for all [place] numbers 04/18/2019

## 2019-04-20 ENCOUNTER — Encounter (HOSPITAL_COMMUNITY): Payer: Self-pay | Admitting: Cardiology

## 2019-04-20 LAB — AEROBIC/ANAEROBIC CULTURE W GRAM STAIN (SURGICAL/DEEP WOUND)

## 2019-04-21 ENCOUNTER — Encounter (HOSPITAL_COMMUNITY): Payer: Self-pay

## 2019-04-21 ENCOUNTER — Emergency Department (HOSPITAL_COMMUNITY)
Admission: EM | Admit: 2019-04-21 | Discharge: 2019-04-21 | Discharge status: Against Medical Advice | Payer: No Typology Code available for payment source | Source: Home / Self Care

## 2019-04-21 ENCOUNTER — Inpatient Hospital Stay (HOSPITAL_COMMUNITY)
Admission: EM | Admit: 2019-04-21 | Discharge: 2019-04-23 | DRG: 252 | Disposition: A | Discharge status: Home / Self Care | Payer: No Typology Code available for payment source | Attending: Internal Medicine | Admitting: Internal Medicine

## 2019-04-21 ENCOUNTER — Encounter (HOSPITAL_COMMUNITY): Payer: Self-pay | Admitting: Emergency Medicine

## 2019-04-21 ENCOUNTER — Other Ambulatory Visit: Payer: Self-pay

## 2019-04-21 DIAGNOSIS — T82838A Hemorrhage of vascular prosthetic devices, implants and grafts, initial encounter: Secondary | ICD-10-CM | POA: Diagnosis present

## 2019-04-21 DIAGNOSIS — T827XXA Infection and inflammatory reaction due to other cardiac and vascular devices, implants and grafts, initial encounter: Secondary | ICD-10-CM | POA: Diagnosis not present

## 2019-04-21 DIAGNOSIS — E871 Hypo-osmolality and hyponatremia: Secondary | ICD-10-CM | POA: Diagnosis present

## 2019-04-21 DIAGNOSIS — Z992 Dependence on renal dialysis: Secondary | ICD-10-CM

## 2019-04-21 DIAGNOSIS — Z8673 Personal history of transient ischemic attack (TIA), and cerebral infarction without residual deficits: Secondary | ICD-10-CM

## 2019-04-21 DIAGNOSIS — Z79891 Long term (current) use of opiate analgesic: Secondary | ICD-10-CM

## 2019-04-21 DIAGNOSIS — Z8619 Personal history of other infectious and parasitic diseases: Secondary | ICD-10-CM

## 2019-04-21 DIAGNOSIS — D649 Anemia, unspecified: Secondary | ICD-10-CM | POA: Diagnosis present

## 2019-04-21 DIAGNOSIS — H409 Unspecified glaucoma: Secondary | ICD-10-CM | POA: Diagnosis present

## 2019-04-21 DIAGNOSIS — Z419 Encounter for procedure for purposes other than remedying health state, unspecified: Secondary | ICD-10-CM

## 2019-04-21 DIAGNOSIS — Z8701 Personal history of pneumonia (recurrent): Secondary | ICD-10-CM

## 2019-04-21 DIAGNOSIS — F17210 Nicotine dependence, cigarettes, uncomplicated: Secondary | ICD-10-CM | POA: Diagnosis present

## 2019-04-21 DIAGNOSIS — Z79899 Other long term (current) drug therapy: Secondary | ICD-10-CM

## 2019-04-21 DIAGNOSIS — D62 Acute posthemorrhagic anemia: Secondary | ICD-10-CM | POA: Diagnosis present

## 2019-04-21 DIAGNOSIS — I120 Hypertensive chronic kidney disease with stage 5 chronic kidney disease or end stage renal disease: Secondary | ICD-10-CM | POA: Diagnosis present

## 2019-04-21 DIAGNOSIS — D631 Anemia in chronic kidney disease: Secondary | ICD-10-CM | POA: Diagnosis present

## 2019-04-21 DIAGNOSIS — I10 Essential (primary) hypertension: Secondary | ICD-10-CM | POA: Diagnosis present

## 2019-04-21 DIAGNOSIS — Z7982 Long term (current) use of aspirin: Secondary | ICD-10-CM

## 2019-04-21 DIAGNOSIS — Z1159 Encounter for screening for other viral diseases: Secondary | ICD-10-CM

## 2019-04-21 DIAGNOSIS — Y832 Surgical operation with anastomosis, bypass or graft as the cause of abnormal reaction of the patient, or of later complication, without mention of misadventure at the time of the procedure: Secondary | ICD-10-CM | POA: Diagnosis present

## 2019-04-21 DIAGNOSIS — T82898A Other specified complication of vascular prosthetic devices, implants and grafts, initial encounter: Secondary | ICD-10-CM | POA: Diagnosis present

## 2019-04-21 DIAGNOSIS — I714 Abdominal aortic aneurysm, without rupture: Secondary | ICD-10-CM | POA: Diagnosis present

## 2019-04-21 DIAGNOSIS — B9561 Methicillin susceptible Staphylococcus aureus infection as the cause of diseases classified elsewhere: Secondary | ICD-10-CM | POA: Diagnosis present

## 2019-04-21 DIAGNOSIS — M79601 Pain in right arm: Secondary | ICD-10-CM

## 2019-04-21 DIAGNOSIS — Z888 Allergy status to other drugs, medicaments and biological substances status: Secondary | ICD-10-CM

## 2019-04-21 DIAGNOSIS — R7881 Bacteremia: Secondary | ICD-10-CM | POA: Diagnosis present

## 2019-04-21 DIAGNOSIS — Z8249 Family history of ischemic heart disease and other diseases of the circulatory system: Secondary | ICD-10-CM

## 2019-04-21 DIAGNOSIS — T82898D Other specified complication of vascular prosthetic devices, implants and grafts, subsequent encounter: Secondary | ICD-10-CM

## 2019-04-21 DIAGNOSIS — Z9889 Other specified postprocedural states: Secondary | ICD-10-CM

## 2019-04-21 DIAGNOSIS — N186 End stage renal disease: Secondary | ICD-10-CM

## 2019-04-21 DIAGNOSIS — F329 Major depressive disorder, single episode, unspecified: Secondary | ICD-10-CM | POA: Diagnosis present

## 2019-04-21 LAB — CULTURE, BLOOD (ROUTINE X 2)
Culture: NO GROWTH
Culture: NO GROWTH
Special Requests: ADEQUATE
Special Requests: ADEQUATE
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
## 2019-04-21 NOTE — ED Triage Notes (Signed)
Patient here for wound check - states he had revision of dialysis fistula on Friday and had aneurysm that burst on Saturday next to access site - states there was a lot of bleeding initially, but it has been oozing since. Denies blood thinner. C/o swelling to R arm and pain at site. Pulses equal bilaterally.

## 2019-04-21 NOTE — ED Triage Notes (Signed)
Pt here earlier but left after triage. Pt here for pain and swelling in his right arm where is fistula is. See triage noted from earlier.

## 2019-04-22 ENCOUNTER — Encounter (HOSPITAL_COMMUNITY)
Admission: EM | Disposition: A | Discharge status: Home / Self Care | Payer: Self-pay | Source: Home / Self Care | Attending: Student

## 2019-04-22 ENCOUNTER — Inpatient Hospital Stay (HOSPITAL_COMMUNITY): Payer: No Typology Code available for payment source

## 2019-04-22 ENCOUNTER — Encounter (HOSPITAL_COMMUNITY): Payer: Self-pay | Admitting: Family Medicine

## 2019-04-22 ENCOUNTER — Observation Stay (HOSPITAL_COMMUNITY): Payer: No Typology Code available for payment source | Admitting: Anesthesiology

## 2019-04-22 ENCOUNTER — Observation Stay (HOSPITAL_COMMUNITY): Payer: No Typology Code available for payment source

## 2019-04-22 DIAGNOSIS — N186 End stage renal disease: Secondary | ICD-10-CM

## 2019-04-22 DIAGNOSIS — I10 Essential (primary) hypertension: Secondary | ICD-10-CM | POA: Diagnosis not present

## 2019-04-22 DIAGNOSIS — H409 Unspecified glaucoma: Secondary | ICD-10-CM | POA: Diagnosis present

## 2019-04-22 DIAGNOSIS — D649 Anemia, unspecified: Secondary | ICD-10-CM | POA: Diagnosis present

## 2019-04-22 DIAGNOSIS — F17210 Nicotine dependence, cigarettes, uncomplicated: Secondary | ICD-10-CM | POA: Diagnosis present

## 2019-04-22 DIAGNOSIS — D62 Acute posthemorrhagic anemia: Secondary | ICD-10-CM | POA: Diagnosis present

## 2019-04-22 DIAGNOSIS — E871 Hypo-osmolality and hyponatremia: Secondary | ICD-10-CM | POA: Diagnosis present

## 2019-04-22 DIAGNOSIS — Z992 Dependence on renal dialysis: Secondary | ICD-10-CM

## 2019-04-22 DIAGNOSIS — T82898A Other specified complication of vascular prosthetic devices, implants and grafts, initial encounter: Secondary | ICD-10-CM

## 2019-04-22 DIAGNOSIS — Z8701 Personal history of pneumonia (recurrent): Secondary | ICD-10-CM | POA: Diagnosis not present

## 2019-04-22 DIAGNOSIS — Z79891 Long term (current) use of opiate analgesic: Secondary | ICD-10-CM | POA: Diagnosis not present

## 2019-04-22 DIAGNOSIS — I714 Abdominal aortic aneurysm, without rupture: Secondary | ICD-10-CM | POA: Diagnosis present

## 2019-04-22 DIAGNOSIS — R7881 Bacteremia: Secondary | ICD-10-CM | POA: Diagnosis present

## 2019-04-22 DIAGNOSIS — B9561 Methicillin susceptible Staphylococcus aureus infection as the cause of diseases classified elsewhere: Secondary | ICD-10-CM | POA: Diagnosis present

## 2019-04-22 DIAGNOSIS — D631 Anemia in chronic kidney disease: Secondary | ICD-10-CM | POA: Diagnosis present

## 2019-04-22 DIAGNOSIS — I120 Hypertensive chronic kidney disease with stage 5 chronic kidney disease or end stage renal disease: Secondary | ICD-10-CM | POA: Diagnosis present

## 2019-04-22 DIAGNOSIS — Z888 Allergy status to other drugs, medicaments and biological substances status: Secondary | ICD-10-CM | POA: Diagnosis not present

## 2019-04-22 DIAGNOSIS — Y832 Surgical operation with anastomosis, bypass or graft as the cause of abnormal reaction of the patient, or of later complication, without mention of misadventure at the time of the procedure: Secondary | ICD-10-CM | POA: Diagnosis present

## 2019-04-22 DIAGNOSIS — Z1159 Encounter for screening for other viral diseases: Secondary | ICD-10-CM | POA: Diagnosis not present

## 2019-04-22 DIAGNOSIS — M79601 Pain in right arm: Secondary | ICD-10-CM | POA: Diagnosis present

## 2019-04-22 DIAGNOSIS — Z8249 Family history of ischemic heart disease and other diseases of the circulatory system: Secondary | ICD-10-CM | POA: Diagnosis not present

## 2019-04-22 DIAGNOSIS — F329 Major depressive disorder, single episode, unspecified: Secondary | ICD-10-CM | POA: Diagnosis present

## 2019-04-22 DIAGNOSIS — T827XXA Infection and inflammatory reaction due to other cardiac and vascular devices, implants and grafts, initial encounter: Secondary | ICD-10-CM | POA: Diagnosis present

## 2019-04-22 DIAGNOSIS — Z7982 Long term (current) use of aspirin: Secondary | ICD-10-CM | POA: Diagnosis not present

## 2019-04-22 DIAGNOSIS — T82838A Hemorrhage of vascular prosthetic devices, implants and grafts, initial encounter: Secondary | ICD-10-CM | POA: Diagnosis present

## 2019-04-22 DIAGNOSIS — Z8673 Personal history of transient ischemic attack (TIA), and cerebral infarction without residual deficits: Secondary | ICD-10-CM | POA: Diagnosis not present

## 2019-04-22 DIAGNOSIS — Z8619 Personal history of other infectious and parasitic diseases: Secondary | ICD-10-CM | POA: Diagnosis not present

## 2019-04-22 DIAGNOSIS — Z79899 Other long term (current) drug therapy: Secondary | ICD-10-CM | POA: Diagnosis not present

## 2019-04-22 HISTORY — PX: INSERTION OF DIALYSIS CATHETER: SHX1324

## 2019-04-22 HISTORY — PX: REVISION OF ARTERIOVENOUS GORETEX GRAFT: SHX6073

## 2019-04-22 LAB — BASIC METABOLIC PANEL
Anion gap: 19 — ABNORMAL HIGH (ref 5–15)
Anion gap: 15 (ref 5–15)
BUN: 72 mg/dL — ABNORMAL HIGH (ref 6–20)
BUN: 73 mg/dL — ABNORMAL HIGH (ref 6–20)
CO2: 20 mmol/L — ABNORMAL LOW (ref 22–32)
CO2: 24 mmol/L (ref 22–32)
Calcium: 8.9 mg/dL (ref 8.9–10.3)
Calcium: 8.8 mg/dL — ABNORMAL LOW (ref 8.9–10.3)
Chloride: 92 mmol/L — ABNORMAL LOW (ref 98–111)
Chloride: 95 mmol/L — ABNORMAL LOW (ref 98–111)
Creatinine, Ser: 13.54 mg/dL — ABNORMAL HIGH (ref 0.61–1.24)
Creatinine, Ser: 14.48 mg/dL — ABNORMAL HIGH (ref 0.61–1.24)
GFR calc Af Amer: 4 mL/min — ABNORMAL LOW (ref >60)
GFR calc Af Amer: 4 mL/min — ABNORMAL LOW (ref >60)
GFR calc non Af Amer: 3 mL/min — ABNORMAL LOW (ref >60)
GFR calc non Af Amer: 3 mL/min — ABNORMAL LOW (ref >60)
Glucose, Bld: 101 mg/dL — ABNORMAL HIGH (ref 70–99)
Glucose, Bld: 93 mg/dL (ref 70–99)
Potassium: 4.8 mmol/L (ref 3.5–5.1)
Potassium: 5 mmol/L (ref 3.5–5.1)
Sodium: 131 mmol/L — ABNORMAL LOW (ref 135–145)
Sodium: 134 mmol/L — ABNORMAL LOW (ref 135–145)

## 2019-04-22 LAB — CBC WITH DIFFERENTIAL/PLATELET
Abs Immature Granulocytes: 0.5 10*3/uL — ABNORMAL HIGH (ref 0.00–0.07)
Abs Immature Granulocytes: 0.54 10*3/uL — ABNORMAL HIGH (ref 0.00–0.07)
Basophils Absolute: 0 10*3/uL (ref 0.0–0.1)
Basophils Absolute: 0 10*3/uL (ref 0.0–0.1)
Basophils Relative: 0 %
Basophils Relative: 0 %
Eosinophils Absolute: 0.3 10*3/uL (ref 0.0–0.5)
Eosinophils Absolute: 0.2 10*3/uL (ref 0.0–0.5)
Eosinophils Relative: 2 %
Eosinophils Relative: 2 %
HCT: 22.9 % — ABNORMAL LOW (ref 39.0–52.0)
HCT: 24 % — ABNORMAL LOW (ref 39.0–52.0)
Hemoglobin: 7.6 g/dL — ABNORMAL LOW (ref 13.0–17.0)
Hemoglobin: 8.1 g/dL — ABNORMAL LOW (ref 13.0–17.0)
Immature Granulocytes: 5 %
Lymphocytes Relative: 5 %
Lymphocytes Relative: 13 %
Lymphs Abs: 0.7 10*3/uL (ref 0.7–4.0)
Lymphs Abs: 1.4 10*3/uL (ref 0.7–4.0)
MCH: 31 pg (ref 26.0–34.0)
MCH: 30.8 pg (ref 26.0–34.0)
MCHC: 33.2 g/dL (ref 30.0–36.0)
MCHC: 33.8 g/dL (ref 30.0–36.0)
MCV: 93.5 fL (ref 80.0–100.0)
MCV: 91.3 fL (ref 80.0–100.0)
Metamyelocytes Relative: 1 %
Monocytes Absolute: 0.5 10*3/uL (ref 0.1–1.0)
Monocytes Absolute: 1.5 10*3/uL — ABNORMAL HIGH (ref 0.1–1.0)
Monocytes Relative: 4 %
Monocytes Relative: 13 %
Myelocytes: 3 %
Neutro Abs: 11.3 10*3/uL — ABNORMAL HIGH (ref 1.7–7.7)
Neutro Abs: 7.2 10*3/uL (ref 1.7–7.7)
Neutrophils Relative %: 85 %
Neutrophils Relative %: 67 %
Platelets: 257 10*3/uL (ref 150–400)
Platelets: 197 10*3/uL (ref 150–400)
RBC: 2.45 MIL/uL — ABNORMAL LOW (ref 4.22–5.81)
RBC: 2.63 MIL/uL — ABNORMAL LOW (ref 4.22–5.81)
RDW: 17.4 % — ABNORMAL HIGH (ref 11.5–15.5)
RDW: 17.1 % — ABNORMAL HIGH (ref 11.5–15.5)
WBC: 13.3 10*3/uL — ABNORMAL HIGH (ref 4.0–10.5)
WBC: 10.8 10*3/uL — ABNORMAL HIGH (ref 4.0–10.5)
nRBC: 0 % (ref 0.0–0.2)
nRBC: 0 /100 WBC
nRBC: 0 % (ref 0.0–0.2)

## 2019-04-22 LAB — MRSA PCR SCREENING: MRSA by PCR: NEGATIVE

## 2019-04-22 LAB — GLUCOSE, CAPILLARY
Glucose-Capillary: 105 mg/dL — ABNORMAL HIGH (ref 70–99)
Glucose-Capillary: 109 mg/dL — ABNORMAL HIGH (ref 70–99)
Glucose-Capillary: 110 mg/dL — ABNORMAL HIGH (ref 70–99)
Glucose-Capillary: 123 mg/dL — ABNORMAL HIGH (ref 70–99)
Glucose-Capillary: 124 mg/dL — ABNORMAL HIGH (ref 70–99)
Glucose-Capillary: 151 mg/dL — ABNORMAL HIGH (ref 70–99)

## 2019-04-22 LAB — HEMOGLOBIN AND HEMATOCRIT, BLOOD
HCT: 28.5 % — ABNORMAL LOW (ref 39.0–52.0)
Hemoglobin: 9.7 g/dL — ABNORMAL LOW (ref 13.0–17.0)

## 2019-04-22 LAB — POCT I-STAT 4, (NA,K, GLUC, HGB,HCT)
Glucose, Bld: 110 mg/dL — ABNORMAL HIGH (ref 70–99)
Glucose, Bld: 196 mg/dL — ABNORMAL HIGH (ref 70–99)
Glucose, Bld: 133 mg/dL — ABNORMAL HIGH (ref 70–99)
HCT: 22 % — ABNORMAL LOW (ref 39.0–52.0)
HCT: 22 % — ABNORMAL LOW (ref 39.0–52.0)
HCT: 23 % — ABNORMAL LOW (ref 39.0–52.0)
Hemoglobin: 7.5 g/dL — ABNORMAL LOW (ref 13.0–17.0)
Hemoglobin: 7.5 g/dL — ABNORMAL LOW (ref 13.0–17.0)
Hemoglobin: 7.8 g/dL — ABNORMAL LOW (ref 13.0–17.0)
Potassium: 6.7 mmol/L (ref 3.5–5.1)
Potassium: 7.1 mmol/L (ref 3.5–5.1)
Potassium: 5.6 mmol/L — ABNORMAL HIGH (ref 3.5–5.1)
Sodium: 129 mmol/L — ABNORMAL LOW (ref 135–145)
Sodium: 130 mmol/L — ABNORMAL LOW (ref 135–145)
Sodium: 131 mmol/L — ABNORMAL LOW (ref 135–145)

## 2019-04-22 LAB — PREPARE RBC (CROSSMATCH)

## 2019-04-22 LAB — APTT: aPTT: 42 seconds — ABNORMAL HIGH (ref 24–36)

## 2019-04-22 LAB — IRON AND TIBC
Iron: 38 ug/dL — ABNORMAL LOW (ref 45–182)
Saturation Ratios: 18 % (ref 17.9–39.5)
TIBC: 211 ug/dL — ABNORMAL LOW (ref 250–450)
UIBC: 173 ug/dL

## 2019-04-22 LAB — SARS CORONAVIRUS 2 BY RT PCR (HOSPITAL ORDER, PERFORMED IN ~~LOC~~ HOSPITAL LAB): SARS Coronavirus 2: NEGATIVE

## 2019-04-22 LAB — PROTIME-INR
INR: 1.2 (ref 0.8–1.2)
Prothrombin Time: 14.8 seconds (ref 11.4–15.2)

## 2019-04-22 SURGERY — INSERTION OF DIALYSIS CATHETER
Anesthesia: General | Site: Chest | Laterality: Right

## 2019-04-22 MED ORDER — SODIUM CHLORIDE 0.9% IV SOLUTION: Freq: Once | INTRAVENOUS | Status: DC

## 2019-04-22 MED ORDER — LABETALOL HCL 5 MG/ML IV SOLN
5.0000 mg | INTRAVENOUS | Status: AC | PRN
  Administered 2019-04-22 (×4): 5 mg via INTRAVENOUS

## 2019-04-22 MED ORDER — SEVELAMER CARBONATE 800 MG PO TABS
800.0000 mg | ORAL_TABLET | Freq: Three times a day (TID) | ORAL | Status: DC
  Administered 2019-04-23: 09:00:00 800 mg via ORAL
  Filled 2019-04-22 (×2): qty 1

## 2019-04-22 MED ORDER — CHLORHEXIDINE GLUCONATE CLOTH 2 % EX PADS: 6.0000 | MEDICATED_PAD | Freq: Every day | CUTANEOUS | Status: DC

## 2019-04-22 MED ORDER — SODIUM CHLORIDE 0.9% FLUSH
3.0000 mL | Freq: Two times a day (BID) | INTRAVENOUS | Status: DC
  Administered 2019-04-22: 3 mL via INTRAVENOUS

## 2019-04-22 MED ORDER — SODIUM CHLORIDE 0.9 % IV SOLN
INTRAVENOUS | Status: DC | PRN
  Administered 2019-04-22: 14:00:00 500 mL

## 2019-04-22 MED ORDER — MIDAZOLAM HCL 5 MG/5ML IJ SOLN
INTRAMUSCULAR | Status: DC | PRN
  Administered 2019-04-22: 2 mg via INTRAVENOUS

## 2019-04-22 MED ORDER — ONDANSETRON HCL 4 MG PO TABS: 4.0000 mg | ORAL_TABLET | Freq: Four times a day (QID) | ORAL | Status: DC | PRN

## 2019-04-22 MED ORDER — SODIUM CHLORIDE 0.9 % IV SOLN
INTRAVENOUS | Status: DC | PRN
  Administered 2019-04-22: 50 ug/min via INTRAVENOUS

## 2019-04-22 MED ORDER — EPHEDRINE 5 MG/ML INJ
INTRAVENOUS | Status: AC
  Filled 2019-04-22: qty 10

## 2019-04-22 MED ORDER — ACETAMINOPHEN 650 MG RE SUPP: 650.0000 mg | Freq: Four times a day (QID) | RECTAL | Status: DC | PRN

## 2019-04-22 MED ORDER — PROPOFOL 10 MG/ML IV BOLUS
INTRAVENOUS | Status: DC | PRN
  Administered 2019-04-22: 200 mg via INTRAVENOUS

## 2019-04-22 MED ORDER — PROPOFOL 10 MG/ML IV BOLUS
INTRAVENOUS | Status: AC
  Filled 2019-04-22: qty 20

## 2019-04-22 MED ORDER — CEFAZOLIN SODIUM-DEXTROSE 2-4 GM/100ML-% IV SOLN
2.0000 g | INTRAVENOUS | Status: DC
  Administered 2019-04-23: 2 g via INTRAVENOUS
  Filled 2019-04-22 (×2): qty 100

## 2019-04-22 MED ORDER — INSULIN ASPART 100 UNIT/ML ~~LOC~~ SOLN
SUBCUTANEOUS | Status: DC | PRN
  Administered 2019-04-22 (×2): 5 [IU] via INTRAVENOUS

## 2019-04-22 MED ORDER — HEPARIN SODIUM (PORCINE) 1000 UNIT/ML IJ SOLN
INTRAMUSCULAR | Status: AC
  Filled 2019-04-22: qty 1

## 2019-04-22 MED ORDER — INSULIN ASPART 100 UNIT/ML ~~LOC~~ SOLN
SUBCUTANEOUS | Status: AC
  Filled 2019-04-22: qty 1

## 2019-04-22 MED ORDER — DEXTROSE 50 % IV SOLN
25.0000 mL | Freq: Once | INTRAVENOUS | Status: AC
  Administered 2019-04-22: 25 mL via INTRAVENOUS

## 2019-04-22 MED ORDER — DEXTROSE 50 % IV SOLN
INTRAVENOUS | Status: AC
  Filled 2019-04-22: qty 50

## 2019-04-22 MED ORDER — AMLODIPINE BESYLATE 10 MG PO TABS: 10.0000 mg | ORAL_TABLET | Freq: Every day | ORAL | Status: DC

## 2019-04-22 MED ORDER — LABETALOL HCL 5 MG/ML IV SOLN
INTRAVENOUS | Status: AC
  Filled 2019-04-22: qty 4

## 2019-04-22 MED ORDER — SODIUM CHLORIDE 0.9% FLUSH
3.0000 mL | Freq: Two times a day (BID) | INTRAVENOUS | Status: DC
  Administered 2019-04-22 – 2019-04-23 (×2): 3 mL via INTRAVENOUS

## 2019-04-22 MED ORDER — 0.9 % SODIUM CHLORIDE (POUR BTL) OPTIME
TOPICAL | Status: DC | PRN
  Administered 2019-04-22: 1000 mL

## 2019-04-22 MED ORDER — LABETALOL HCL 200 MG PO TABS
300.0000 mg | ORAL_TABLET | Freq: Three times a day (TID) | ORAL | Status: DC
  Administered 2019-04-22 – 2019-04-23 (×2): 300 mg via ORAL
  Filled 2019-04-22 (×2): qty 1

## 2019-04-22 MED ORDER — DEXAMETHASONE SODIUM PHOSPHATE 10 MG/ML IJ SOLN
INTRAMUSCULAR | Status: AC
  Filled 2019-04-22: qty 1

## 2019-04-22 MED ORDER — FENTANYL CITRATE (PF) 100 MCG/2ML IJ SOLN
12.5000 ug | INTRAMUSCULAR | Status: DC | PRN
  Administered 2019-04-23: 25 ug via INTRAVENOUS
  Administered 2019-04-23: 12.5 ug via INTRAVENOUS
  Filled 2019-04-22 (×2): qty 2

## 2019-04-22 MED ORDER — LIDOCAINE 2% (20 MG/ML) 5 ML SYRINGE
INTRAMUSCULAR | Status: AC
  Filled 2019-04-22: qty 5

## 2019-04-22 MED ORDER — DEXTROSE 50 % IV SOLN
25.0000 mL | Freq: Once | INTRAVENOUS | Status: AC
  Administered 2019-04-22: 17:00:00 25 mL via INTRAVENOUS

## 2019-04-22 MED ORDER — DEXAMETHASONE SODIUM PHOSPHATE 4 MG/ML IJ SOLN
INTRAMUSCULAR | Status: DC | PRN
  Administered 2019-04-22: 8 mg via INTRAVENOUS

## 2019-04-22 MED ORDER — ONDANSETRON HCL 4 MG/2ML IJ SOLN: 4.0000 mg | Freq: Four times a day (QID) | INTRAMUSCULAR | Status: DC | PRN

## 2019-04-22 MED ORDER — HEMOSTATIC AGENTS (NO CHARGE) OPTIME
TOPICAL | Status: DC | PRN
  Administered 2019-04-22: 1 via TOPICAL

## 2019-04-22 MED ORDER — EPHEDRINE SULFATE-NACL 50-0.9 MG/10ML-% IV SOSY
PREFILLED_SYRINGE | INTRAVENOUS | Status: DC | PRN
  Administered 2019-04-22: 10 mg via INTRAVENOUS
  Administered 2019-04-22: 5 mg via INTRAVENOUS
  Administered 2019-04-22: 10 mg via INTRAVENOUS

## 2019-04-22 MED ORDER — HYDRALAZINE HCL 50 MG PO TABS
100.0000 mg | ORAL_TABLET | Freq: Three times a day (TID) | ORAL | Status: DC
  Administered 2019-04-22 – 2019-04-23 (×3): 100 mg via ORAL
  Filled 2019-04-22 (×3): qty 2

## 2019-04-22 MED ORDER — HEPARIN SODIUM (PORCINE) 1000 UNIT/ML IJ SOLN
INTRAMUSCULAR | Status: DC | PRN
  Administered 2019-04-22: 3400 [IU]

## 2019-04-22 MED ORDER — LIDOCAINE 2% (20 MG/ML) 5 ML SYRINGE
INTRAMUSCULAR | Status: DC | PRN
  Administered 2019-04-22: 100 mg via INTRAVENOUS

## 2019-04-22 MED ORDER — MIDAZOLAM HCL 2 MG/2ML IJ SOLN
INTRAMUSCULAR | Status: AC
  Filled 2019-04-22: qty 2

## 2019-04-22 MED ORDER — ALBUMIN HUMAN 5 % IV SOLN
INTRAVENOUS | Status: DC | PRN
  Administered 2019-04-22: 15:00:00 via INTRAVENOUS

## 2019-04-22 MED ORDER — MORPHINE SULFATE (PF) 4 MG/ML IV SOLN
4.0000 mg | Freq: Once | INTRAVENOUS | Status: AC
  Administered 2019-04-22: 01:00:00 4 mg via INTRAVENOUS
  Filled 2019-04-22: qty 1

## 2019-04-22 MED ORDER — SODIUM CHLORIDE 0.9 % IV SOLN
1.5000 g | INTRAVENOUS | Status: AC
  Administered 2019-04-22: 1.5 g via INTRAVENOUS
  Filled 2019-04-22: qty 1.5

## 2019-04-22 MED ORDER — FENTANYL CITRATE (PF) 100 MCG/2ML IJ SOLN
INTRAMUSCULAR | Status: DC | PRN
  Administered 2019-04-22: 25 ug via INTRAVENOUS
  Administered 2019-04-22: 75 ug via INTRAVENOUS
  Administered 2019-04-22 (×4): 25 ug via INTRAVENOUS
  Administered 2019-04-22: 50 ug via INTRAVENOUS

## 2019-04-22 MED ORDER — ACETAMINOPHEN 325 MG PO TABS
650.0000 mg | ORAL_TABLET | Freq: Four times a day (QID) | ORAL | Status: DC | PRN
  Administered 2019-04-22: 650 mg via ORAL
  Filled 2019-04-22: qty 2

## 2019-04-22 MED ORDER — ONDANSETRON HCL 4 MG/2ML IJ SOLN
INTRAMUSCULAR | Status: DC | PRN
  Administered 2019-04-22: 4 mg via INTRAVENOUS

## 2019-04-22 MED ORDER — SODIUM CHLORIDE 0.9% FLUSH: 3.0000 mL | INTRAVENOUS | Status: DC | PRN

## 2019-04-22 MED ORDER — SODIUM CHLORIDE 0.9 % IV SOLN: INTRAVENOUS | Status: DC

## 2019-04-22 MED ORDER — DARBEPOETIN ALFA 100 MCG/0.5ML IJ SOSY
100.0000 ug | PREFILLED_SYRINGE | INTRAMUSCULAR | Status: DC
  Administered 2019-04-23 (×2): 100 ug via INTRAVENOUS
  Filled 2019-04-22: qty 0.5

## 2019-04-22 MED ORDER — PHENYLEPHRINE 40 MCG/ML (10ML) SYRINGE FOR IV PUSH (FOR BLOOD PRESSURE SUPPORT)
PREFILLED_SYRINGE | INTRAVENOUS | Status: AC
  Filled 2019-04-22: qty 20

## 2019-04-22 MED ORDER — SODIUM CHLORIDE 0.9 % IV SOLN
250.0000 mL | INTRAVENOUS | Status: DC | PRN
  Administered 2019-04-22 (×2): via INTRAVENOUS

## 2019-04-22 MED ORDER — SODIUM CHLORIDE 0.9 % IV SOLN
INTRAVENOUS | Status: AC
  Filled 2019-04-22: qty 1.2

## 2019-04-22 MED ORDER — LIDOCAINE-EPINEPHRINE (PF) 1 %-1:200000 IJ SOLN
INTRAMUSCULAR | Status: AC
  Filled 2019-04-22: qty 30

## 2019-04-22 MED ORDER — DEXTROSE 50 % IV SOLN
INTRAVENOUS | Status: DC | PRN
  Administered 2019-04-22: 50 mL via INTRAVENOUS

## 2019-04-22 MED ORDER — PHENYLEPHRINE 40 MCG/ML (10ML) SYRINGE FOR IV PUSH (FOR BLOOD PRESSURE SUPPORT)
PREFILLED_SYRINGE | INTRAVENOUS | Status: DC | PRN
  Administered 2019-04-22 (×3): 80 ug via INTRAVENOUS

## 2019-04-22 MED ORDER — FENTANYL CITRATE (PF) 250 MCG/5ML IJ SOLN
INTRAMUSCULAR | Status: AC
  Filled 2019-04-22: qty 5

## 2019-04-22 SURGICAL SUPPLY — 71 items
ARMBAND PINK RESTRICT EXTREMIT (MISCELLANEOUS) ×6 IMPLANT
BAG DECANTER FOR FLEXI CONT (MISCELLANEOUS) ×4 IMPLANT
BANDAGE ELASTIC 4 VELCRO ST LF (GAUZE/BANDAGES/DRESSINGS) ×2 IMPLANT
BIOPATCH RED 1 DISK 7.0 (GAUZE/BANDAGES/DRESSINGS) ×3 IMPLANT
BIOPATCH RED 1IN DISK 7.0MM (GAUZE/BANDAGES/DRESSINGS) ×1
BNDG ESMARK 4X9 LF (GAUZE/BANDAGES/DRESSINGS) ×2 IMPLANT
CANISTER SUCT 3000ML PPV (MISCELLANEOUS) ×4 IMPLANT
CATH EMB 3FR 40CM (CATHETERS) ×2 IMPLANT
CATH PALINDROME RT-P 15FX19CM (CATHETERS) IMPLANT
CATH PALINDROME RT-P 15FX23CM (CATHETERS) ×2 IMPLANT
CATH PALINDROME RT-P 15FX28CM (CATHETERS) IMPLANT
CATH PALINDROME RT-P 15FX55CM (CATHETERS) IMPLANT
CLIP VESOCCLUDE MED 6/CT (CLIP) ×4 IMPLANT
CLIP VESOCCLUDE SM WIDE 6/CT (CLIP) ×6 IMPLANT
COVER PROBE W GEL 5X96 (DRAPES) ×6 IMPLANT
COVER SURGICAL LIGHT HANDLE (MISCELLANEOUS) ×4 IMPLANT
COVER WAND RF STERILE (DRAPES) ×2 IMPLANT
CUFF TOURNIQUET SINGLE 18IN (TOURNIQUET CUFF) ×2 IMPLANT
DERMABOND ADVANCED (GAUZE/BANDAGES/DRESSINGS) ×4
DERMABOND ADVANCED .7 DNX12 (GAUZE/BANDAGES/DRESSINGS) ×2 IMPLANT
DRAPE C-ARM 42X72 X-RAY (DRAPES) ×4 IMPLANT
DRAPE CHEST BREAST 15X10 FENES (DRAPES) ×4 IMPLANT
DRAPE SURG 17X23 STRL (DRAPES) ×2 IMPLANT
DRSG COVADERM 4X6 (GAUZE/BANDAGES/DRESSINGS) ×2 IMPLANT
ELECT CAUTERY BLADE 6.4 (BLADE) ×2 IMPLANT
ELECT REM PT RETURN 9FT ADLT (ELECTROSURGICAL) ×4
ELECTRODE REM PT RTRN 9FT ADLT (ELECTROSURGICAL) ×2 IMPLANT
GAUZE 4X4 16PLY RFD (DISPOSABLE) ×2 IMPLANT
GAUZE SPONGE 4X4 12PLY STRL LF (GAUZE/BANDAGES/DRESSINGS) ×2 IMPLANT
GLOVE BIO SURGEON STRL SZ7.5 (GLOVE) ×8 IMPLANT
GOWN STRL NON-REIN LRG LVL3 (GOWN DISPOSABLE) ×2 IMPLANT
GOWN STRL REUS W/ TWL LRG LVL3 (GOWN DISPOSABLE) ×4 IMPLANT
GOWN STRL REUS W/ TWL XL LVL3 (GOWN DISPOSABLE) ×2 IMPLANT
GOWN STRL REUS W/TWL LRG LVL3 (GOWN DISPOSABLE) ×4
GOWN STRL REUS W/TWL XL LVL3 (GOWN DISPOSABLE) ×6
HEMOSTAT SNOW SURGICEL 2X4 (HEMOSTASIS) ×2 IMPLANT
INSERT FOGARTY SM (MISCELLANEOUS) ×2 IMPLANT
KIT BASIN OR (CUSTOM PROCEDURE TRAY) ×4 IMPLANT
KIT TURNOVER KIT B (KITS) ×4 IMPLANT
NDL 18GX1X1/2 (RX/OR ONLY) (NEEDLE) ×2 IMPLANT
NDL HYPO 25GX1X1/2 BEV (NEEDLE) ×2 IMPLANT
NEEDLE 18GX1X1/2 (RX/OR ONLY) (NEEDLE) ×4 IMPLANT
NEEDLE HYPO 25GX1X1/2 BEV (NEEDLE) ×4 IMPLANT
NS IRRIG 1000ML POUR BTL (IV SOLUTION) ×4 IMPLANT
PACK CV ACCESS (CUSTOM PROCEDURE TRAY) ×4 IMPLANT
PACK SURGICAL SETUP 50X90 (CUSTOM PROCEDURE TRAY) ×4 IMPLANT
PAD ARMBOARD 7.5X6 YLW CONV (MISCELLANEOUS) ×8 IMPLANT
PADDING CAST SYNTHETIC 4 (CAST SUPPLIES) ×2
PADDING CAST SYNTHETIC 4X4 STR (CAST SUPPLIES) IMPLANT
PENCIL BUTTON HOLSTER BLD 10FT (ELECTRODE) ×2 IMPLANT
SOAP 2 % CHG 4 OZ (WOUND CARE) ×2 IMPLANT
SPONGE LAP 18X18 RF (DISPOSABLE) ×4 IMPLANT
STAPLER VISISTAT 35W (STAPLE) ×2 IMPLANT
SUT ETHILON 3 0 PS 1 (SUTURE) ×4 IMPLANT
SUT GORETEX 6.0 TH-9 30 IN (SUTURE) ×2 IMPLANT
SUT GORETEX CV-6TTC-13 36IN (SUTURE) ×2 IMPLANT
SUT MNCRL AB 4-0 PS2 18 (SUTURE) ×6 IMPLANT
SUT PROLENE 6 0 BV (SUTURE) ×10 IMPLANT
SUT VIC AB 2-0 CT1 27 (SUTURE) ×8
SUT VIC AB 2-0 CT1 TAPERPNT 27 (SUTURE) IMPLANT
SUT VIC AB 3-0 SH 27 (SUTURE) ×2
SUT VIC AB 3-0 SH 27X BRD (SUTURE) ×4 IMPLANT
SYR 10ML LL (SYRINGE) ×4 IMPLANT
SYR 20CC LL (SYRINGE) ×8 IMPLANT
SYR 5ML LL (SYRINGE) ×4 IMPLANT
SYR CONTROL 10ML LL (SYRINGE) ×4 IMPLANT
TOWEL GREEN STERILE (TOWEL DISPOSABLE) ×4 IMPLANT
TOWEL GREEN STERILE FF (TOWEL DISPOSABLE) ×4 IMPLANT
TOWEL OR 17X26 4PK STRL BLUE (TOWEL DISPOSABLE) ×4 IMPLANT
UNDERPAD 30X30 (UNDERPADS AND DIAPERS) ×4 IMPLANT
WATER STERILE IRR 1000ML POUR (IV SOLUTION) ×4 IMPLANT

## 2019-04-22 NOTE — Progress Notes (Signed)
Patient has received 20mg  IV labetalol, BP remains elevated at 183/105.  Dr. Smith Robert notified. No new orders at this time since patient will be dialyzed tonight.
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
## 2019-04-22 NOTE — Plan of Care (Signed)

## 2019-04-22 NOTE — ED Notes (Signed)
Attempted to obtain IV access x2. IV team consult placed.
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
## 2019-04-22 NOTE — Anesthesia Procedure Notes (Addendum)
Procedure Name: LMA Insertion Date/Time: 04/22/2019 12:50 PM Performed by: Neldon Newport, CRNA Pre-anesthesia Checklist: Patient identified, Emergency Drugs available, Suction available, Patient being monitored and Timeout performed Patient Re-evaluated:Patient Re-evaluated prior to induction Oxygen Delivery Method: Circle system utilized Preoxygenation: Pre-oxygenation with 100% oxygen Induction Type: IV induction LMA: LMA inserted LMA Size: 5.0 Number of attempts: 1 Placement Confirmation: positive ETCO2 and breath sounds checked- equal and bilateral Tube secured with: Tape Dental Injury: Teeth and Oropharynx as per pre-operative assessment

## 2019-04-22 NOTE — Progress Notes (Signed)
On admission to the unit patient graft site had small amount of blood that was circled to monitor.  When reassessed later, the blood had spread outside of the marking and was leaking slightly around bandage.  Pressure was applied and original bandage was reinforced with gauze and paper tape to add pressure.  Asked patient to notify if bleeding started through reinforced dressing.  Patient is alert and oriented and able to monitor site.  Will continue to monitor and report to oncoming shift.

## 2019-04-22 NOTE — Transfer of Care (Signed)
Immediate Anesthesia Transfer of Care Note  Patient: Frank Fuller  Procedure(s) Performed: TUNNEL DIALYSIS CATHETER (Right Chest) REVISION OF FOREARM ARTERIOVENOUS GORETEX GRAFT (Right )  Patient Location: PACU  Anesthesia Type:General  Level of Consciousness: awake, alert  and oriented  Airway & Oxygen Therapy: Patient Spontanous Breathing and Patient connected to nasal cannula oxygen  Post-op Assessment: Report given to RN and Post -op Vital signs reviewed and stable  Post vital signs: Reviewed and stable  Last Vitals:  Vitals Value Taken Time  BP 136/106 04/22/19 1641  Temp 36.2 C 04/22/19 1641  Pulse 70 04/22/19 1647  Resp 20 04/22/19 1647  SpO2 100 % 04/22/19 1647  Vitals shown include unvalidated device data.  Last Pain:  Vitals:   04/22/19 0741  TempSrc: Oral  PainSc:          Complications: No apparent anesthesia complications

## 2019-04-22 NOTE — Anesthesia Preprocedure Evaluation (Addendum)
Anesthesia Evaluation  Patient identified by MRN, date of birth, ID band Patient awake    Reviewed: Allergy & Precautions, NPO status , Patient's Chart, lab work & pertinent test results  Airway Mallampati: II  TM Distance: >3 FB Neck ROM: full    Dental  (+) Teeth Intact   Pulmonary Current Smoker,    Pulmonary exam normal        Cardiovascular hypertension, Pt. on medications Normal cardiovascular exam Rhythm:regular     Neuro/Psych PSYCHIATRIC DISORDERS Depression CVA    GI/Hepatic (+) Hepatitis -  Endo/Other    Renal/GU ESRF and DialysisRenal disease     Musculoskeletal   Abdominal   Peds  Hematology  (+) Blood dyscrasia, anemia ,   Anesthesia Other Findings   Reproductive/Obstetrics                            Anesthesia Physical Anesthesia Plan  ASA: III  Anesthesia Plan: General   Post-op Pain Management:    Induction: Intravenous  PONV Risk Score and Plan: 1 and Ondansetron  Airway Management Planned: LMA  Additional Equipment:   Intra-op Plan:   Post-operative Plan: Extubation in OR  Informed Consent: I have reviewed the patients History and Physical, chart, labs and discussed the procedure including the risks, benefits and alternatives for the proposed anesthesia with the patient or authorized representative who has indicated his/her understanding and acceptance.       Plan Discussed with: CRNA and Surgeon  Anesthesia Plan Comments:        Anesthesia Quick Evaluation

## 2019-04-22 NOTE — Progress Notes (Signed)
Post op potassium is 7.1   I spoke with Dr. [name] who will work on getting him to HD in the near furute.  Insulin and glucose given   Annamarie Major

## 2019-04-22 NOTE — ED Notes (Signed)
IV team at bedside 

## 2019-04-22 NOTE — Op Note (Signed)
Patient name: Frank Fuller MRN: [id number] DOB: 18-Sep-1961 Sex: male  04/22/2019 Pre-operative Diagnosis: End-stage renal disease, infected right arm AV graft Post-operative diagnosis:  Same Surgeon:  Eda Paschal. Donzetta Matters, MD Assistants: Arlee Muslim, PA; Laurence Slate, [place] Procedure Performed: 1.  Right IJ 23 cm tunneled dialysis catheter placed with ultrasound guidance 2.  Excision of right arm AV graft x2 3.  Harvest antecubital basilic vein right arm 4.  Patch angioplasty right brachial artery with basilic vein  Indications: 58 year old male with infected right forearm AV graft.  He is undergone revision of this recently now has bleeding persistent infection and is indicated for excision placement of tunneled dialysis catheter.  Findings: Right IJ was patent and easily compressible and 23 cm catheter was placed terminating in the SVC atrial junction.  The graft was incorporated from the antecubitum distally on the venous side there were 2 grafts one that was very well incorporated one that was just recently placed these were both excised.  The area where there was a blowout medially on the arterial side was excised and there was also another graft excised on the medial aspect.  In the end all graft was removed and the wound was irrigated.  The brachial artery was heavily diseased.  Branch the basilic vein above the elbow was harvested and used as a patch.  At completion the patient had a palpable radial artery pulse at the wrist and all graft was removed all skin was closed with staples distal to the elbow and the vein harvest site above the elbow was closed with Monocryl.  There is adequate basilic vein for fistula creation above the elbow.   Procedure:  The patient was identified in the holding area and taken to the operating room where is placed upon operative table.  General anesthesia was induced he was given antibiotics timeout was called.  We initially his ultrasound identified the  artery which was noted be patent and compressible.  This was cannulated with direct ultrasound visualization with 18-gauge needle and wire was passed easily.  The wire tract was dilated and a catheter was tunneled from a counterincision.  Introducer sheath was placed with direct fluoroscopic visualization.  Catheter was placed to the SVC atrial junction.  He was then assembled and affixed to the skin with 3-0 nylon suture and flushed heparinized saline.  The neck incision was closed with 4 Monocryl and arise placed at both sites and a sterile dressing was placed.  Drapes were taken down and sterilely prepped draped in the right upper extremity.  We began by opening his previous incision for the anastomosis.  We dissected down to the venous anastomosis.  This appeared to be end-to-end with a course to take suture.  We were able to dissected to the vein.  The vein was heavily diseased it was clamped and transected and the graft was clamped as well.  In dissecting out the artery appeared there was possibly a pseudoaneurysm That We Entered and We Had Strong Pulsatile Bleeding.  A Tourniquet Was Placed up Esmarch Was Used To Exsanguinate the Arm and We Inflated the Tourniquet to 250 mmHg.  I Was Able to Dissect out the Brachial Artery Proximally and Distally.  There Was Heavy Scar Tissue Here.  We Did Dissected out the Trifurcation of the Brachial Artery Placed Vesseloops around All These and Clamped It.  The Tourniquet Was Allowed down.  We Then Excised the Entire Graft from the Artery.  The Artery Was Heavily Diseased and Had  to Be Repaired on the Lateral and with Running 6-0 Prolene Suture.  We Then Use Ultrasound We Identified a Basilic Vein above the Elbow Which Was Noted to Be Enlarged Appear to Be Suitable for Patch.  Longitudinal Incision Was Made We Dissected down to the Vein.  There Were Multiple Branches.  This Vein Appeared to Be Great Use for Fistula so We Did Not Take It.  We Then Dissected out Towards  the Elbow We Found a Large Branch Were Able to Tie This off on Both Sides and Opened It and Spatulated It.  We Then Allowed Our Clamps down.  The Tourniquet Was Taken down at This Time.  We Flushed the Artery with Heparinized Saline Both Directions.  The Vein Patch Was Then Sewn in Place with 6-0 Prolene Suture.  Prior to Completion As Muscular Flushing All Directions.  Upon Completion There Is Palpable Radial Pulse.  There Was Good Signal in Our Brachial Artery.  Satisfied with This We Turned Our Attention Towards Excising the Graft.  We Opened His Previous Incision Still Recently Open.  We Also Had to Excise an Elliptical Area of Skin Where He Recently Had His Blowout Medially.  We Tediously Dissected out Both Bluntly and with Cautery to Get All the Graft out.  I Used Ultrasound to Identify the Grafts Remove Them All Did Not See Any Further Graft with Ultrasound in the Arm.  The Wound Was [employer].  We Obtain Hemostasis in the Wounds.  We Did Attempt to Reapproximate Some of the Levels with Vicryl and Closed All the Incisions below the Antecubitum with Staples.  The basilic vein harvest site we closed with running 3-0 Vicryl deep and 4-0 Monocryl.  Dermabond placed to level the skin.  We then placed gauze and Ace wraps over the arm.  He was awakened anesthesia having tolerated procedure without immediate complication.  All counts were correct at completion.  EBL: 1000 cc     [name] C. Donzetta Matters, MD [facility] Office: [phone] Pager: [phone]

## 2019-04-22 NOTE — H&P (Signed)
   History and Physical Update  The patient was interviewed and re-examined.  The patient's previous History and Physical has been reviewed and is unchanged from Dr. Carlis Abbott evaluation earlier today. Plan for left arm graft excision and tdc today in OR.   Arul Farabee C. Donzetta Matters, MD [facility] Office: [phone] Pager: [phone]  04/22/2019, 11:59 AM

## 2019-04-22 NOTE — Progress Notes (Signed)
Admission from earlier this morning with dialysis graft rupture/bleeding.  Currently without active bleeding.  Patient has no complaint.  Some swelling in left forearm.  Dressing over right forearm appears clean and dry.  HD graft with bruits. Plan for HD cath, dialysis and graft revision today.  VVS and nephrology on board. Continue Ancef with HD for MSSA bacteremia.

## 2019-04-22 NOTE — Progress Notes (Signed)
[facility] R[facility]ING NOTE   Subjective:   This is a 58 year old gentleman with a history of hypertension end-stage renal disease right forearm AV fistula usual dialysis days Tuesday Thursday Saturday he has a history of CVA 12/20/2016.  History of hepatitis C status post treatment 08/02/2017 and history of hypertension that has been poorly controlled due to poor Dayarmys to antihypertensive medications.  He was recently admitted and discharged 04/18/2019 to complete a 6-week course of IV Ancef for MSSA bacteremia.  With a stop date August 7 he had a negative TEE.  He presented to the emergency room with an ulcerated segment along the medial limb of the left forearm graft.  Amlodipine 10 mg daily hydralazine 100 mg 3 times daily labetalol 300 mg 3 times daily Renvela 800 mg 3 times daily  Sodium 134 potassium 5.0 chloride 95 CO2 24 BUN 73 creatinine 14.48 glucose 93 calcium 8.8 WBC 10.8 hemoglobin 8.1 platelets 197   Blood pressure 150/81 pulse 75 temperature 98.5 O2 sats 97% room air      Objective:  Vital signs in last 24 hours:  Temp:  [98.1 F (36.7 C)-98.5 F (36.9 C)] 98.5 F (36.9 C) (06/30 0741) Pulse Rate:  [65-83] 75 (06/30 0924) Resp:  [13-23] 18 (06/30 0741) BP: (139-178)/(77-115) 152/81 (06/30 0924) SpO2:  [96 %-98 %] 97 % (06/30 0741) Weight:  [90.6 kg-91.5 kg] 90.6 kg (06/30 0234)  Weight change:  Filed Weights   04/22/19 0024 04/22/19 0234  Weight: 91.5 kg 90.6 kg    Intake/Output: I/O last 3 completed shifts: In: 120 [P.O.:120] Out: -    Intake/Output this shift:  No intake/output data recorded.  CVS- RRR no murmurs rubs or gallops RS- CTA no wheezes or rales ABD- BS present soft non-distended EXT- no edema ulcerated medial limb right AV graft   Basic Metabolic Panel: Recent Labs  Lab 04/16/19 0625 04/17/19 0750 04/18/19 0755 04/21/19 2359 04/22/19 0635  NA 129* 127* 130* 131* 134*  K 5.9* 4.8 4.5 4.8 5.0  CL 89* 87* 91* 92*  95*  CO2 22 20* 22 20* 24  GLUCOSE 134* 109* 91 101* 93  BUN 72* 108* 71* 72* 73*  CREATININE 13.71* 14.95* 11.47* 13.54* 14.48*  CALCIUM 8.9 8.9 8.8* 8.9 8.8*  PHOS 6.4* 4.8*  --   --   --     Liver Function Tests: Recent Labs  Lab 04/16/19 0625 04/17/19 0750  ALBUMIN 2.9* 2.7*   No results for input(s): LIPASE, AMYLASE in the last 168 hours. No results for input(s): AMMONIA in the last 168 hours.  CBC: Recent Labs  Lab 04/16/19 0625 04/17/19 0750 04/21/19 2359 04/22/19 0635  WBC 8.1 9.1 13.3* 10.8*  NEUTROABS 6.8  --  11.3* 7.2  HGB 10.6* 9.9* 7.6* 8.1*  HCT 30.9* 28.3* 22.9* 24.0*  MCV 90.9 89.8 93.5 91.3  PLT 96* 109* 257 197    Cardiac Enzymes: No results for input(s): CKTOTAL, CKMB, CKMBINDEX, TROPONINI in the last 168 hours.  BNP: Invalid input(s): POCBNP  CBG: Recent Labs  Lab 04/15/19 1847  GLUCAP 107*    Microbiology: Results for orders placed or performed during the hospital encounter of 04/21/19  MRSA PCR Screening     Status: None   Collection Time: 04/22/19  6:33 AM   Specimen: Nasopharyngeal  Result Value Ref Range Status   MRSA by PCR NEGATIVE NEGATIVE Final    Comment:        The GeneXpert MRSA Assay (FDA approved for NASAL specimens  only), is one component of a comprehensive MRSA colonization surveillance program. It is not intended to diagnose MRSA infection nor to guide or monitor treatment for MRSA infections. Performed at [facility], [place] [address] [place], [place] [address]     Coagulation Studies: Recent Labs    04/22/19 0145  LABPROT 14.8  INR 1.2    Urinalysis: No results for input(s): COLORURINE, LABSPEC, PHURINE, GLUCOSEU, HGBUR, BILIRUBINUR, KETONESUR, PROTEINUR, UROBILINOGEN, NITRITE, LEUKOCYTESUR in the last 72 hours.  Invalid input(s): APPERANCEUR    Imaging: No results found.   Medications:   . sodium chloride    . [START ON 04/23/2019] cefUROXime (ZINACEF)  IV     . amLODipine  10 mg  Oral QHS  . hydrALAZINE  100 mg Oral TID  . labetalol  300 mg Oral TID  . sevelamer carbonate  800 mg Oral TID WC  . sodium chloride flush  3 mL Intravenous Q12H  . sodium chloride flush  3 mL Intravenous Q12H   sodium chloride, acetaminophen **OR** acetaminophen, fentaNYL (SUBLIMAZE) injection, ondansetron **OR** ondansetron (ZOFRAN) IV, sodium chloride flush   As per kidney center prescription 2K 2.25 calcium  estimated dry weight 86 kg  micera-50 mcg every 2 weeks  Assessment/ Plan:   ESRD-Tuesday Thursday dialysis [facility].  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  MSSA infection forearm graft.  Continue Ancef 2 g q. HD until May 30, 2019  Ruptured AV graft appreciate assistance of Dr. Carlis Abbott   LOS: 0 Sherril Croon @TODAY @10 :54 AM
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
## 2019-04-23 ENCOUNTER — Encounter (HOSPITAL_COMMUNITY): Payer: Self-pay | Admitting: Vascular Surgery

## 2019-04-23 DIAGNOSIS — N186 End stage renal disease: Secondary | ICD-10-CM

## 2019-04-23 DIAGNOSIS — D62 Acute posthemorrhagic anemia: Secondary | ICD-10-CM

## 2019-04-23 DIAGNOSIS — I10 Essential (primary) hypertension: Secondary | ICD-10-CM

## 2019-04-23 DIAGNOSIS — Z992 Dependence on renal dialysis: Secondary | ICD-10-CM

## 2019-04-23 DIAGNOSIS — E871 Hypo-osmolality and hyponatremia: Secondary | ICD-10-CM

## 2019-04-23 DIAGNOSIS — R7881 Bacteremia: Secondary | ICD-10-CM

## 2019-04-23 DIAGNOSIS — T82898A Other specified complication of vascular prosthetic devices, implants and grafts, initial encounter: Secondary | ICD-10-CM

## 2019-04-23 LAB — PROTIME-INR
INR: 1.2 (ref 0.8–1.2)
Prothrombin Time: 14.7 seconds (ref 11.4–15.2)

## 2019-04-23 LAB — CBC
HCT: 27.6 % — ABNORMAL LOW (ref 39.0–52.0)
Hemoglobin: 9.5 g/dL — ABNORMAL LOW (ref 13.0–17.0)
MCH: 30.4 pg (ref 26.0–34.0)
MCHC: 34.4 g/dL (ref 30.0–36.0)
MCV: 88.5 fL (ref 80.0–100.0)
Platelets: 198 10*3/uL (ref 150–400)
RBC: 3.12 MIL/uL — ABNORMAL LOW (ref 4.22–5.81)
RDW: 16.8 % — ABNORMAL HIGH (ref 11.5–15.5)
WBC: 13.7 10*3/uL — ABNORMAL HIGH (ref 4.0–10.5)
nRBC: 0 % (ref 0.0–0.2)

## 2019-04-23 LAB — TYPE AND SCREEN
ABO/RH(D): O POS
Antibody Screen: NEGATIVE
Unit division: 0
Unit division: 0

## 2019-04-23 LAB — NOVEL CORONAVIRUS, NAA (HOSP ORDER, SEND-OUT TO REF LAB; TAT 18-24 HRS): SARS-CoV-2, NAA: NOT DETECTED

## 2019-04-23 LAB — RENAL FUNCTION PANEL
Albumin: 2.8 g/dL — ABNORMAL LOW (ref 3.5–5.0)
Anion gap: 15 (ref 5–15)
BUN: 46 mg/dL — ABNORMAL HIGH (ref 6–20)
CO2: 24 mmol/L (ref 22–32)
Calcium: 8.6 mg/dL — ABNORMAL LOW (ref 8.9–10.3)
Chloride: 94 mmol/L — ABNORMAL LOW (ref 98–111)
Creatinine, Ser: 10.21 mg/dL — ABNORMAL HIGH (ref 0.61–1.24)
GFR calc Af Amer: 6 mL/min — ABNORMAL LOW (ref >60)
GFR calc non Af Amer: 5 mL/min — ABNORMAL LOW (ref >60)
Glucose, Bld: 119 mg/dL — ABNORMAL HIGH (ref 70–99)
Phosphorus: 5 mg/dL — ABNORMAL HIGH (ref 2.5–4.6)
Potassium: 4.2 mmol/L (ref 3.5–5.1)
Sodium: 133 mmol/L — ABNORMAL LOW (ref 135–145)

## 2019-04-23 LAB — BPAM RBC
Blood Product Expiration Date: 202007312359
Blood Product Expiration Date: 202007312359
ISSUE DATE / TIME: 202006301622
ISSUE DATE / TIME: 202006301622
Unit Type and Rh: 5100
Unit Type and Rh: 5100

## 2019-04-23 MED ORDER — LIDOCAINE HCL (PF) 1 % IJ SOLN: 5.0000 mL | INTRAMUSCULAR | Status: DC | PRN

## 2019-04-23 MED ORDER — HEPARIN SODIUM (PORCINE) 1000 UNIT/ML IJ SOLN
INTRAMUSCULAR | Status: AC
  Administered 2019-04-23: 01:00:00
  Filled 2019-04-23: qty 4

## 2019-04-23 MED ORDER — ALTEPLASE 2 MG IJ SOLR: 2.0000 mg | Freq: Once | INTRAMUSCULAR | Status: DC | PRN

## 2019-04-23 MED ORDER — LOSARTAN POTASSIUM 50 MG PO TABS
100.0000 mg | ORAL_TABLET | Freq: Every day | ORAL | Status: DC
  Administered 2019-04-23: 100 mg via ORAL
  Filled 2019-04-23: qty 2

## 2019-04-23 MED ORDER — PENTAFLUOROPROP-TETRAFLUOROETH EX AERO: 1.0000 "application " | INHALATION_SPRAY | CUTANEOUS | Status: DC | PRN

## 2019-04-23 MED ORDER — CEFAZOLIN SODIUM-DEXTROSE 2-4 GM/100ML-% IV SOLN: 2.0000 g | INTRAVENOUS | 0 refills | Status: DC

## 2019-04-23 MED ORDER — CHLORHEXIDINE GLUCONATE CLOTH 2 % EX PADS: 6.0000 | MEDICATED_PAD | Freq: Every day | CUTANEOUS | Status: DC

## 2019-04-23 MED ORDER — FENTANYL CITRATE (PF) 100 MCG/2ML IJ SOLN
25.0000 ug | Freq: Once | INTRAMUSCULAR | Status: AC
  Administered 2019-04-23: 25 ug via INTRAVENOUS
  Filled 2019-04-23: qty 2

## 2019-04-23 MED ORDER — HEPARIN SODIUM (PORCINE) 1000 UNIT/ML DIALYSIS: 1000.0000 [IU] | INTRAMUSCULAR | Status: DC | PRN

## 2019-04-23 MED ORDER — LIDOCAINE-PRILOCAINE 2.5-2.5 % EX CREA: 1.0000 "application " | TOPICAL_CREAM | CUTANEOUS | Status: DC | PRN

## 2019-04-23 MED ORDER — HEPARIN SODIUM (PORCINE) 1000 UNIT/ML IJ SOLN
3.4000 mL | Freq: Once | INTRAMUSCULAR | Status: AC
  Administered 2019-04-23: 3400 [IU] via INTRAVENOUS
  Filled 2019-04-23: qty 3.4

## 2019-04-23 MED ORDER — HYDRALAZINE HCL 20 MG/ML IJ SOLN
10.0000 mg | Freq: Once | INTRAMUSCULAR | Status: AC
  Administered 2019-04-23: 10 mg via INTRAVENOUS
  Filled 2019-04-23: qty 1

## 2019-04-23 MED ORDER — LOSARTAN POTASSIUM 100 MG PO TABS: 100.0000 mg | ORAL_TABLET | Freq: Every day | ORAL | 0 refills | Status: DC

## 2019-04-23 MED ORDER — SODIUM CHLORIDE 0.9 % IV SOLN: 100.0000 mL | INTRAVENOUS | Status: DC | PRN

## 2019-04-23 MED ORDER — DARBEPOETIN ALFA 100 MCG/0.5ML IJ SOSY
PREFILLED_SYRINGE | INTRAMUSCULAR | Status: AC
  Filled 2019-04-23: qty 0.5

## 2019-04-23 MED ORDER — HEPARIN SODIUM (PORCINE) 1000 UNIT/ML IJ SOLN
3.4000 mL | Freq: Once | INTRAMUSCULAR | Status: AC
  Administered 2019-04-23: 3400 [IU] via INTRAVENOUS

## 2019-04-23 NOTE — Discharge Summary (Signed)
Physician Discharge Summary  Frank Fuller TFT:[id number] DOB: [birth year] DOA: 04/21/2019  PCP: [facility]  Admit date: 04/21/2019 Discharge date: 04/23/2019  Admitted From: Home Disposition: Home  Recommendations for Outpatient Follow-up:  1. Follow up with PCP in 1-2 weeks 2. Please obtain BMP/CBC in one week 3. Follow-up with vascular surgery in 2 weeks scheduled for staple removal and possible single-stage basilic vein fistula 4. Continue antibiotics per Nephrology with dialysis, through August 7th   Discharge Condition: Stable CODE STATUS: Full Diet recommendation: Renal diet  Brief/Interim Summary: Frank Fuller is a 58 y.o. male with medical history significant for hypertension, hepatitis C, history of CVA, and end-stage renal disease on hemodialysis, now presenting to the emergency department for evaluation of right arm pain and swelling around the dialysis graft with oozing of blood from the site.  Patient was admitted to the hospital a week ago with sepsis secondary to MSSA bacteremia likely from infected dialysis graft, underwent revision of the graft on 04/15/2019, has been continued on IV cefazolin, and now presents for evaluation of bleeding from the graft as well as surrounding pain and swelling. Upon arrival to the ED, patient is found to be afebrile, saturating well on room air, slightly tachypneic, and hypertensive to 180/100.  Chemistry panel is notable for sodium of 131, bicarbonate 20, and BUN of 72.  CBC features a leukocytosis to 13,300 and normocytic anemia with hemoglobin 7.6.  Vascular surgery was consulted by the ED physician and recommended medical admission.  Patient now status post TDC and right arm graft excision with patch angioplasty brachial artery.  Discussion with vascular indicates patient stable for discharge, no further episodes of bleeding, labs appear stable, patient reports pain is well controlled tolerating p.o. denies nausea, vomiting, diarrhea,  constipation, headache, fevers, chills.  She will need close follow-up with PCP and vascular surgery in 2 weeks time scheduled for staple removal and possible single-stage basilic vein fistula.  Patient otherwise stable and agreeable for discharge home.  Discharge Diagnoses:  Principal Problem:   Problem with dialysis access [facility]) Active Problems:   Accelerated hypertension   Hyponatremia   ESRD on dialysis ([facility])   MSSA bacteremia   Acute blood loss anemia  Discharge Instructions Follow-up with vascular surgery and PCP as scheduled above.  Allergies as of 04/23/2019      Reactions   Chlorhexidine Other (See Comments)   Unknown reaction Patch skin test done at dialysis 06/26/17  - staff using clear dressing and alcohol to clean exit site of catheter   Clonidine Derivatives Other (See Comments)   unresponsive   Lisinopril Other (See Comments)   unresponsive   Carvedilol Rash      Medication List    TAKE these medications   acetaminophen 500 MG tablet Commonly known as: TYLENOL Take 1,000 mg by mouth every 6 (six) hours as needed for fever or headache (pain).   amLODipine 10 MG tablet Commonly known as: NORVASC Take 10 mg by mouth at bedtime.   aspirin EC 81 MG tablet Take 81 mg by mouth every evening.   Auryxia 1 GM 210 MG(Fe) tablet Generic drug: ferric citrate Take 420-630 mg by mouth See admin instructions. Take 3 tablets (630 mg) by mouth three times daily with meals, take 2 tablets (420 mg) with snacks   ceFAZolin 2-4 GM/100ML-% IVPB Commonly known as: ANCEF Inject 100 mLs (2 g total) into the vein Every Tuesday,Thursday,and Saturday with dialysis for 16 doses. Start taking on: April 24, 2019   Ensure  Take 237 mLs by mouth 2 (two) times a day.   hydrALAZINE 100 MG tablet Commonly known as: APRESOLINE Take 100 mg by mouth 3 (three) times daily. Hold for systolic blood pressure 403/47   HYDROcodone-acetaminophen 5-325 MG tablet Commonly known as: Norco Take 1  tablet by mouth every 6 (six) hours as needed for moderate pain.   labetalol 300 MG tablet Commonly known as: NORMODYNE Take 300 mg by mouth 3 (three) times daily.   lidocaine-prilocaine cream Commonly known as: EMLA Apply 1 application topically See admin instructions. Apply topically three times week (Tuesday, Thursday, Saturday) prior to port access   losartan 100 MG tablet Commonly known as: COZAAR Take 1 tablet (100 mg total) by mouth daily.   sevelamer carbonate 800 MG tablet Commonly known as: RENVELA Take 1 tablet (800 mg total) by mouth 3 (three) times daily with meals for 30 days.       Allergies  Allergen Reactions  . Chlorhexidine Other (See Comments)    Unknown reaction Patch skin test done at dialysis 06/26/17  - staff using clear dressing and alcohol to clean exit site of catheter  . Clonidine Derivatives Other (See Comments)    unresponsive  . Lisinopril Other (See Comments)    unresponsive  . Carvedilol Rash    Consultations:  Nephrology, Vascular Surgery   Procedures/Studies: Ct Abdomen Pelvis Wo Contrast  Result Date: 04/14/2019 CLINICAL DATA:  Abdominal pain, fever. Suspected abscess. EXAM: CT CHEST, ABDOMEN AND PELVIS WITHOUT CONTRAST TECHNIQUE: Multidetector CT imaging of the chest, abdomen and pelvis was performed following the standard protocol without IV contrast. COMPARISON:  04/12/2019 FINDINGS: CT CHEST FINDINGS Cardiovascular: Enlarged heart. Small pericardial effusion measuring 7 mm in maximum thickness. Calcific atherosclerotic disease of the coronary arteries and aorta. Mediastinum/Nodes: No enlarged mediastinal, hilar, or axillary lymph nodes. Thyroid gland, and trachea demonstrate no significant findings. Diffuse mild dilation of the esophagus. Lungs/Pleura: Scattered ground-glass opacities in the right lower lobe and right middle lobe, with minimal ground-glass opacification of the rest of the lung parenchyma. Musculoskeletal: No chest wall  mass or suspicious bone lesions identified. CT ABDOMEN PELVIS FINDINGS Hepatobiliary: Normal appearance of the liver. Vicarious excretion of contrast in the gallbladder, likely residual from the CT scan dated 04/12/2019 Pancreas: Unremarkable. No pancreatic ductal dilatation or surrounding inflammatory changes. Spleen: Normal in size without focal abnormality. Adrenals/Urinary Tract: Numerous hypoattenuated lesions throughout both kidneys, incompletely evaluated due to lack of IV contrast. The largest 2.9 cm mass in the superior pole of the right kidney does not satisfy the criteria for simple cyst. Atrophic appearance of the kidneys. The minimally distended urinary bladder contains excreted contrast. Stomach/Bowel: Stomach is within normal limits. Appendix appears normal. No evidence of bowel wall thickening, distention, or inflammatory changes. Vascular/Lymphatic: Calcific atherosclerotic disease and tortuosity of the aorta. Again seen is a fusiform abdominal aortic aneurysm of the infrarenal abdominal aorta with maximum AP measurement of 3.5 cm. Reproductive: Prostate is unremarkable. Other: No abdominal wall hernia or abnormality. No abdominopelvic ascites. Musculoskeletal: Mild multilevel spondylosis of the spine. Advanced spondylosis at L4-L5 and L5-S1. IMPRESSION: 1. Interval development of confluent ground-glass airspace opacifications in the right lower and right middle lobes, with minimal ground-glass opacification of the rest of the lung parenchyma. Findings may represent asymmetric edema, however atypical/viral pneumonia, including COVID pneumonia is also of consideration. 2. Numerous hypoattenuated lesions throughout both kidneys, incompletely evaluated due to lack of IV contrast. The largest 2.9 cm mass in the superior pole of the right kidney does  not satisfy the criteria for a simple cyst. It may represent a hemorrhagic cyst, or a solid renal mass. 3. Enlarged heart with small pericardial effusion.  4. Calcific atherosclerotic disease of the coronary arteries and aorta. 5. Diffuse dilation of the esophagus with circumferential mucosal thickening of the distal esophagus, suggestive of esophagitis/dysmotility. 6. Fusiform infrarenal abdominal aortic aneurysm with maximum AP measurement of 3.5 cm. Recommend followup by ultrasound in 2 years. This recommendation follows ACR consensus guidelines: White Paper of the ACR Incidental Findings Committee II on Vascular Findings. J Am Coll Radiol 2013; 10:789-794. Electronically Signed   By: Fidela Salisbury M.D.   On: 04/14/2019 15:38   Ct Chest Wo Contrast  Result Date: 04/14/2019 CLINICAL DATA:  Abdominal pain, fever. Suspected abscess. EXAM: CT CHEST, ABDOMEN AND PELVIS WITHOUT CONTRAST TECHNIQUE: Multidetector CT imaging of the chest, abdomen and pelvis was performed following the standard protocol without IV contrast. COMPARISON:  04/12/2019 FINDINGS: CT CHEST FINDINGS Cardiovascular: Enlarged heart. Small pericardial effusion measuring 7 mm in maximum thickness. Calcific atherosclerotic disease of the coronary arteries and aorta. Mediastinum/Nodes: No enlarged mediastinal, hilar, or axillary lymph nodes. Thyroid gland, and trachea demonstrate no significant findings. Diffuse mild dilation of the esophagus. Lungs/Pleura: Scattered ground-glass opacities in the right lower lobe and right middle lobe, with minimal ground-glass opacification of the rest of the lung parenchyma. Musculoskeletal: No chest wall mass or suspicious bone lesions identified. CT ABDOMEN PELVIS FINDINGS Hepatobiliary: Normal appearance of the liver. Vicarious excretion of contrast in the gallbladder, likely residual from the CT scan dated 04/12/2019 Pancreas: Unremarkable. No pancreatic ductal dilatation or surrounding inflammatory changes. Spleen: Normal in size without focal abnormality. Adrenals/Urinary Tract: Numerous hypoattenuated lesions throughout both kidneys, incompletely  evaluated due to lack of IV contrast. The largest 2.9 cm mass in the superior pole of the right kidney does not satisfy the criteria for simple cyst. Atrophic appearance of the kidneys. The minimally distended urinary bladder contains excreted contrast. Stomach/Bowel: Stomach is within normal limits. Appendix appears normal. No evidence of bowel wall thickening, distention, or inflammatory changes. Vascular/Lymphatic: Calcific atherosclerotic disease and tortuosity of the aorta. Again seen is a fusiform abdominal aortic aneurysm of the infrarenal abdominal aorta with maximum AP measurement of 3.5 cm. Reproductive: Prostate is unremarkable. Other: No abdominal wall hernia or abnormality. No abdominopelvic ascites. Musculoskeletal: Mild multilevel spondylosis of the spine. Advanced spondylosis at L4-L5 and L5-S1. IMPRESSION: 1. Interval development of confluent ground-glass airspace opacifications in the right lower and right middle lobes, with minimal ground-glass opacification of the rest of the lung parenchyma. Findings may represent asymmetric edema, however atypical/viral pneumonia, including COVID pneumonia is also of consideration. 2. Numerous hypoattenuated lesions throughout both kidneys, incompletely evaluated due to lack of IV contrast. The largest 2.9 cm mass in the superior pole of the right kidney does not satisfy the criteria for a simple cyst. It may represent a hemorrhagic cyst, or a solid renal mass. 3. Enlarged heart with small pericardial effusion. 4. Calcific atherosclerotic disease of the coronary arteries and aorta. 5. Diffuse dilation of the esophagus with circumferential mucosal thickening of the distal esophagus, suggestive of esophagitis/dysmotility. 6. Fusiform infrarenal abdominal aortic aneurysm with maximum AP measurement of 3.5 cm. Recommend followup by ultrasound in 2 years. This recommendation follows ACR consensus guidelines: White Paper of the ACR Incidental Findings Committee II on  Vascular Findings. J Am Coll Radiol 2013; 10:789-794. Electronically Signed   By: Fidela Salisbury M.D.   On: 04/14/2019 15:38   Ct Abdomen Pelvis W  Contrast  Result Date: 04/12/2019 CLINICAL DATA:  Dark emesis and left lower quadrant pain today at dialysis. EXAM: CT ABDOMEN AND PELVIS WITH CONTRAST TECHNIQUE: Multidetector CT imaging of the abdomen and pelvis was performed using the standard protocol following bolus administration of intravenous contrast. CONTRAST:  163mL OMNIPAQUE IOHEXOL 300 MG/ML  SOLN COMPARISON:  08/02/2017 FINDINGS: Lower chest: Mild bibasilar dependent atelectasis. Hepatobiliary: Liver demonstrates couple tiny subcentimeter hypodensities too small to characterize but likely cysts. Gallbladder and biliary tree are normal. Pancreas: Normal. Spleen: Normal. Adrenals/Urinary Tract: Adrenal glands are normal. Multiple bilateral renal cortical hypodensities likely cysts, although some are indeterminate as the largest is over the upper pole right kidney measuring 2.7 cm with Hounsfield unit measurements of 32. Bladder and visualized ureters are normal. Stomach/Bowel: Stomach and small bowel are within normal. Appendix is normal. Colon is unremarkable. Vascular/Lymphatic: Moderate calcified plaque over the abdominal aorta. Distal abdominal aorta measures 3.5 cm in AP diameter. Moderate calcified plaque over the iliac arteries. No adenopathy. Reproductive: Normal. Other: No free fluid or focal inflammatory change. Metallic density over the lateral left lower quadrant. Musculoskeletal: Degenerative change of the lower lumbar spine with disc disease at the L4-5 and L5-S1 levels. IMPRESSION: No acute findings in the abdomen/pelvis. Multiple bilateral renal cortical hypodensities likely cysts, although some are indeterminate as the largest measures 2.7 cm over the upper pole right kidney. Recommend further evaluation with renal protocol CT on elective basis. Couple tiny subcentimeter liver  hypodensities too small to characterize but likely cysts. Infrarenal abdominal aortic aneurysm measuring 3.5 cm in AP diameter. Recommend followup by ultrasound in 2 years. This recommendation follows ACR consensus guidelines: White Paper of the ACR Incidental Findings Committee II on Vascular Findings. J Am Coll Radiol 2013; 10:789-794. Aortic aneurysm NOS (ICD10-I71.9). Aortic Atherosclerosis (ICD10-I70.0). Electronically Signed   By: Marin Olp M.D.   On: 04/12/2019 13:24   Dg Chest Port 1 View  Result Date: 04/14/2019 CLINICAL DATA:  Fever. EXAM: PORTABLE CHEST 1 VIEW COMPARISON:  Radiograph of April 12, 2019. FINDINGS: Stable cardiomegaly. Atherosclerosis of thoracic aorta is noted. No pneumothorax or pleural effusion is noted. Left lung is clear. Mild right basilar atelectasis or infiltrate is noted. Bony thorax unremarkable. IMPRESSION: Mild right basilar atelectasis or infiltrate is noted. Aortic Atherosclerosis (ICD10-I70.0). Electronically Signed   By: Marijo Conception M.D.   On: 04/14/2019 14:34   Dg Chest Port 1 View  Result Date: 04/12/2019 CLINICAL DATA:  Fever today. EXAM: PORTABLE CHEST 1 VIEW COMPARISON:  11/11/2018 and prior chest radiographs FINDINGS: Cardiomegaly noted. There is no evidence of focal airspace disease, pulmonary edema, suspicious pulmonary nodule/mass, pleural effusion, or pneumothorax. No acute bony abnormalities are identified. IMPRESSION: Cardiomegaly without evidence of acute cardiopulmonary disease. Electronically Signed   By: Margarette Canada M.D.   On: 04/12/2019 12:20   Dg Chest Port 1v Same Day  Result Date: 04/22/2019 CLINICAL DATA:  Dialysis catheter placement. EXAM: PORTABLE CHEST 1 VIEW COMPARISON:  04/14/2019 FINDINGS: The heart size is moderately enlarged. Aortic atherosclerosis noted. Interval placement of right sided dialysis catheter with tip at the cavoatrial junction. Nodular opacity within the right middle lobe as seen on recent chest CT is again noted  measuring approximately 2.8 cm and is likely post infectious or inflammatory. Pulmonary vascular congestion is noted. No pleural effusion. IMPRESSION: 1. No complications after right chest wall dialysis catheter. Tip projects over the cavoatrial junction. 2. Nodular opacity in the right middle lobe, unchanged and likely representing asymmetric edema or infection. Electronically  Signed   By: Kerby Moors M.D.   On: 04/22/2019 20:32   Dg Fluoro Guide Cv Line-no Report  Result Date: 04/22/2019 Fluoroscopy was utilized by the requesting physician.  No radiographic interpretation.    Subjective: No acute issues or events overnight, denies chest pain, nausea, vomiting, headache, fevers or chills.  Discharge Exam: Vitals:   04/23/19 0623 04/23/19 0844  BP: (!) 184/93 (!) 190/103  Pulse: 76 76  Resp:  20  Temp:    SpO2:  98%   Vitals:   04/23/19 0457 04/23/19 0500 04/23/19 0623 04/23/19 0844  BP: (!) 206/116 (!) 210/100 (!) 184/93 (!) 190/103  Pulse: 74 76 76 76  Resp: 19   20  Temp: 98.8 F (37.1 C)     TempSrc: Oral     SpO2: 97%   98%  Weight:      Height:        General:  Pleasantly resting in bed, No acute distress. HEENT: Normocephalic atraumatic. Sclerae nonicteric, noninjected. Extraocular movements intact bilaterally. Neck: Without mass or deformity. Trachea is midline. Lungs: Clear to auscultate bilaterally without rhonchi, wheeze, or rales. Heart: Regular rate and rhythm.  Without murmurs, rubs, or gallops. Abdomen: Soft, nontender, nondistended.  Without guarding or rebound. Extremities: Without cyanosis, clubbing, edema, or obvious deformity. Vascular: Dorsalis pedis and posterior tibial pulses palpable bilaterally. Skin: Warm and dry, no erythema, no ulcerations.  The results of significant diagnostics from this hospitalization (including imaging, microbiology, ancillary and laboratory) are listed below for reference.    Microbiology: Recent Results (from the past  240 hour(s))  Culture, blood (routine x 2)     Status: Abnormal   Collection Time: 04/14/19  2:10 PM   Specimen: BLOOD  Result Value Ref Range Status   Specimen Description BLOOD LEFT WRIST  Final   Special Requests   Final    BOTTLES DRAWN AEROBIC AND ANAEROBIC Blood Culture adequate volume   Culture  Setup Time   Final    IN BOTH AEROBIC AND ANAEROBIC BOTTLES GRAM POSITIVE COCCI CRITICAL RESULT CALLED TO, READ BACK BY AND VERIFIED WITH: G. ABBOTT,PHARMD 0301 04/15/2019 Mena Goes Performed at [facility], [place] [address], [place], [place] [address]    Culture STAPHYLOCOCCUS AUREUS (A)  Final   Report Status 04/17/2019 FINAL  Final   Organism ID, Bacteria STAPHYLOCOCCUS AUREUS  Final      Susceptibility   Staphylococcus aureus - MIC*    CIPROFLOXACIN <=0.5 SENSITIVE Sensitive     ERYTHROMYCIN <=0.25 SENSITIVE Sensitive     GENTAMICIN <=0.5 SENSITIVE Sensitive     OXACILLIN <=0.25 SENSITIVE Sensitive     TETRACYCLINE <=1 SENSITIVE Sensitive     VANCOMYCIN 1 SENSITIVE Sensitive     TRIMETH/SULFA <=10 SENSITIVE Sensitive     CLINDAMYCIN <=0.25 SENSITIVE Sensitive     RIFAMPIN <=0.5 SENSITIVE Sensitive     Inducible Clindamycin NEGATIVE Sensitive     * STAPHYLOCOCCUS AUREUS  Blood Culture ID Panel (Reflexed)     Status: Abnormal   Collection Time: 04/14/19  2:10 PM  Result Value Ref Range Status   Enterococcus species NOT DETECTED NOT DETECTED Final   Listeria monocytogenes NOT DETECTED NOT DETECTED Final   Staphylococcus species DETECTED (A) NOT DETECTED Final    Comment: CRITICAL RESULT CALLED TO, READ BACK BY AND VERIFIED WITH: G. ABBOTT,PHARMD 0301 04/15/2019 T. TYSOR    Staphylococcus aureus (BCID) DETECTED (A) NOT DETECTED Final    Comment: Methicillin (oxacillin) susceptible Staphylococcus aureus (MSSA). Preferred therapy  is anti staphylococcal beta lactam antibiotic (Cefazolin or Nafcillin), unless clinically contraindicated. CRITICAL RESULT CALLED TO, READ BACK BY  AND VERIFIED WITH: G. ABBOTT,PHARMD 0301 04/15/2019 T. TYSOR    Methicillin resistance NOT DETECTED NOT DETECTED Final   Streptococcus species NOT DETECTED NOT DETECTED Final   Streptococcus agalactiae NOT DETECTED NOT DETECTED Final   Streptococcus pneumoniae NOT DETECTED NOT DETECTED Final   Streptococcus pyogenes NOT DETECTED NOT DETECTED Final   Acinetobacter baumannii NOT DETECTED NOT DETECTED Final   Enterobacteriaceae species NOT DETECTED NOT DETECTED Final   Enterobacter cloacae complex NOT DETECTED NOT DETECTED Final   Escherichia coli NOT DETECTED NOT DETECTED Final   Klebsiella oxytoca NOT DETECTED NOT DETECTED Final   Klebsiella pneumoniae NOT DETECTED NOT DETECTED Final   Proteus species NOT DETECTED NOT DETECTED Final   Serratia marcescens NOT DETECTED NOT DETECTED Final   Haemophilus influenzae NOT DETECTED NOT DETECTED Final   Neisseria meningitidis NOT DETECTED NOT DETECTED Final   Pseudomonas aeruginosa NOT DETECTED NOT DETECTED Final   Candida albicans NOT DETECTED NOT DETECTED Final   Candida glabrata NOT DETECTED NOT DETECTED Final   Candida krusei NOT DETECTED NOT DETECTED Final   Candida parapsilosis NOT DETECTED NOT DETECTED Final   Candida tropicalis NOT DETECTED NOT DETECTED Final    Comment: Performed at [facility], [place]. [address], [place], [place] [address]  Culture, blood (routine x 2)     Status: Abnormal   Collection Time: 04/14/19  2:20 PM   Specimen: BLOOD  Result Value Ref Range Status   Specimen Description BLOOD BLOOD LEFT FOREARM  Final   Special Requests   Final    BOTTLES DRAWN AEROBIC AND ANAEROBIC Blood Culture results may not be optimal due to an inadequate volume of blood received in culture bottles   Culture  Setup Time   Final    GRAM POSITIVE COCCI IN CLUSTERS IN BOTH AEROBIC AND ANAEROBIC BOTTLES CRITICAL VALUE NOTED.  VALUE IS CONSISTENT WITH PREVIOUSLY REPORTED AND CALLED VALUE.    Culture (A)  Final    STAPHYLOCOCCUS  AUREUS SUSCEPTIBILITIES PERFORMED ON PREVIOUS CULTURE WITHIN THE LAST 5 DAYS. Performed at [facility], [place] [address] [place], [place] [address]    Report Status 04/17/2019 FINAL  Final  SARS Coronavirus 2 (CEPHEID - Performed in [place] hospital lab), Hosp Order     Status: None   Collection Time: 04/14/19  2:29 PM   Specimen: Nasopharyngeal Swab  Result Value Ref Range Status   SARS Coronavirus 2 NEGATIVE NEGATIVE Final    Comment: (NOTE) If result is NEGATIVE SARS-CoV-2 target nucleic acids are NOT DETECTED. The SARS-CoV-2 RNA is generally detectable in upper and lower  respiratory specimens during the acute phase of infection. The lowest  concentration of SARS-CoV-2 viral copies this assay can detect is 250  copies / mL. A negative result does not preclude SARS-CoV-2 infection  and should not be used as the sole basis for treatment or other  patient management decisions.  A negative result may occur with  improper specimen collection / handling, submission of specimen other  than nasopharyngeal swab, presence of viral mutation(s) within the  areas targeted by this assay, and inadequate number of viral copies  (<250 copies / mL). A negative result must be combined with clinical  observations, patient history, and epidemiological information. If result is POSITIVE SARS-CoV-2 target nucleic acids are DETECTED. The SARS-CoV-2 RNA is generally detectable in upper and lower  respiratory specimens dur  ing the acute phase of infection.  Positive  results are indicative of active infection with SARS-CoV-2.  Clinical  correlation with patient history and other diagnostic information is  necessary to determine patient infection status.  Positive results do  not rule out bacterial infection or co-infection with other viruses. If result is PRESUMPTIVE POSTIVE SARS-CoV-2 nucleic acids MAY BE PRESENT.   A presumptive positive result was obtained on the submitted specimen  and  confirmed on repeat testing.  While 2019 novel coronavirus  (SARS-CoV-2) nucleic acids may be present in the submitted sample  additional confirmatory testing may be necessary for epidemiological  and / or clinical management purposes  to differentiate between  SARS-CoV-2 and other Sarbecovirus currently known to infect humans.  If clinically indicated additional testing with an alternate test  methodology [phone]) is advised. The SARS-CoV-2 RNA is generally  detectable in upper and lower respiratory sp ecimens during the acute  phase of infection. The expected result is Negative. Fact Sheet for Patients:  StrictlyIdeas.no Fact Sheet for Healthcare Providers: BankingDealers.co.za This test is not yet approved or cleared by the [place] FDA and has been authorized for detection and/or diagnosis of SARS-CoV-2 by FDA under an Emergency Use Authorization (EUA).  This EUA will remain in effect (meaning this test can be used) for the duration of the COVID-19 declaration under Section 564(b)(1) of the Act, 21 U.S.C. section 360bbb-3(b)(1), unless the authorization is terminated or revoked sooner. Performed at [facility], [place] [address] [place], [place] [address]   Aerobic/Anaerobic Culture (surgical/deep wound)     Status: None   Collection Time: 04/15/19  9:28 PM   Specimen: Wound  Result Value Ref Range Status   Specimen Description WOUND RIGHT FOREARM  Final   Special Requests PT ON VANC  Final   Gram Stain   Final    RARE WBC PRESENT, PREDOMINANTLY PMN FEW GRAM POSITIVE COCCI    Culture   Final    MODERATE STAPHYLOCOCCUS AUREUS NO ANAEROBES ISOLATED Performed at [facility], [place] [address], [place], [place] [address]    Report Status 04/20/2019 FINAL  Final   Organism ID, Bacteria STAPHYLOCOCCUS AUREUS  Final      Susceptibility   Staphylococcus aureus - MIC*    CIPROFLOXACIN <=0.5 SENSITIVE Sensitive      ERYTHROMYCIN <=0.25 SENSITIVE Sensitive     GENTAMICIN <=0.5 SENSITIVE Sensitive     OXACILLIN <=0.25 SENSITIVE Sensitive     TETRACYCLINE <=1 SENSITIVE Sensitive     VANCOMYCIN <=0.5 SENSITIVE Sensitive     TRIMETH/SULFA <=10 SENSITIVE Sensitive     CLINDAMYCIN <=0.25 SENSITIVE Sensitive     RIFAMPIN <=0.5 SENSITIVE Sensitive     Inducible Clindamycin NEGATIVE Sensitive     * MODERATE STAPHYLOCOCCUS AUREUS  Culture, blood (routine x 2)     Status: None   Collection Time: 04/16/19  9:09 AM   Specimen: BLOOD  Result Value Ref Range Status   Specimen Description BLOOD LEFT ANTECUBITAL  Final   Special Requests   Final    BOTTLES DRAWN AEROBIC AND ANAEROBIC Blood Culture adequate volume   Culture   Final    NO GROWTH 5 DAYS Performed at [facility], [place] [address] [place], [place] [address]    Report Status 04/21/2019 FINAL  Final  Culture, blood (routine x 2)     Status: None   Collection Time: 04/16/19  9:15 AM   Specimen: BLOOD RIGHT HAND  Result Value Ref Range  Status   Specimen Description BLOOD RIGHT HAND  Final   Special Requests   Final    BOTTLES DRAWN AEROBIC ONLY Blood Culture adequate volume   Culture   Final    NO GROWTH 5 DAYS Performed at [facility], [address], [place], [place] [address]    Report Status 04/21/2019 FINAL  Final  Novel Coronavirus,NAA,(SEND-OUT TO REF LAB - TAT 24-48 hrs); Hosp Order     Status: None   Collection Time: 04/22/19  1:30 AM   Specimen: Nasopharyngeal Swab; Respiratory  Result Value Ref Range Status   SARS-CoV-2, NAA NOT DETECTED NOT DETECTED Final    Comment: (NOTE) This test was developed and its performance characteristics determined by [employer]. This test has not been FDA cleared or approved. This test has been authorized by FDA under an Emergency Use Authorization (EUA). This test is only authorized for the duration of time the declaration that circumstances exist justifying the authorization of  the emergency use of in vitro diagnostic tests for detection of SARS-CoV-2 virus and/or diagnosis of COVID-19 infection under section 564(b)(1) of the Act, 21 U.S.C. 616WVP-7(T)(0), unless the authorization is terminated or revoked sooner. When diagnostic testing is negative, the possibility of a false negative result should be considered in the context of a patient's recent exposures and the presence of clinical signs and symptoms consistent with COVID-19. An individual without symptoms of COVID-19 and who is not shedding SARS-CoV-2 virus would expect to have a negative (not detected) result in this assay. Performed  At: [facility] [address] [place], [place] [id number] Rush Farmer MD EV:[id number]    [place]  Final    Comment: Performed at [facility], [place] [address] [place], [place] [address]  MRSA PCR Screening     Status: None   Collection Time: 04/22/19  6:33 AM   Specimen: Nasopharyngeal  Result Value Ref Range Status   MRSA by PCR NEGATIVE NEGATIVE Final    Comment:        The GeneXpert MRSA Assay (FDA approved for NASAL specimens only), is one component of a comprehensive MRSA colonization surveillance program. It is not intended to diagnose MRSA infection nor to guide or monitor treatment for MRSA infections. Performed at [facility], [place] [address], [place], [place] [address]   SARS Coronavirus 2 (CEPHEID - Performed in [place] hospital lab), Hosp Order     Status: None   Collection Time: 04/22/19 10:17 AM   Specimen: Nasopharyngeal Swab  Result Value Ref Range Status   SARS Coronavirus 2 NEGATIVE NEGATIVE Final    Comment: (NOTE) If result is NEGATIVE SARS-CoV-2 target nucleic acids are NOT DETECTED. The SARS-CoV-2 RNA is generally detectable in upper and lower  respiratory specimens during the acute phase of infection. The lowest  concentration of SARS-CoV-2 viral copies this assay can detect is 250   copies / mL. A negative result does not preclude SARS-CoV-2 infection  and should not be used as the sole basis for treatment or other  patient management decisions.  A negative result may occur with  improper specimen collection / handling, submission of specimen other  than nasopharyngeal swab, presence of viral mutation(s) within the  areas targeted by this assay, and inadequate number of viral copies  (<250 copies / mL). A negative result must be combined with clinical  observations, patient history, and epidemiological information. If result is POSITIVE SARS-CoV-2 target nucleic acids are DETECTED. The SARS-CoV-2 RNA  is generally detectable in upper and lower  respiratory specimens dur ing the acute phase of infection.  Positive  results are indicative of active infection with SARS-CoV-2.  Clinical  correlation with patient history and other diagnostic information is  necessary to determine patient infection status.  Positive results do  not rule out bacterial infection or co-infection with other viruses. If result is PRESUMPTIVE POSTIVE SARS-CoV-2 nucleic acids MAY BE PRESENT.   A presumptive positive result was obtained on the submitted specimen  and confirmed on repeat testing.  While 2019 novel coronavirus  (SARS-CoV-2) nucleic acids may be present in the submitted sample  additional confirmatory testing may be necessary for epidemiological  and / or clinical management purposes  to differentiate between  SARS-CoV-2 and other Sarbecovirus currently known to infect humans.  If clinically indicated additional testing with an alternate test  methodology [phone]) is advised. The SARS-CoV-2 RNA is generally  detectable in upper and lower respiratory sp ecimens during the acute  phase of infection. The expected result is Negative. Fact Sheet for Patients:  StrictlyIdeas.no Fact Sheet for Healthcare  Providers: BankingDealers.co.za This test is not yet approved or cleared by the [place] FDA and has been authorized for detection and/or diagnosis of SARS-CoV-2 by FDA under an Emergency Use Authorization (EUA).  This EUA will remain in effect (meaning this test can be used) for the duration of the COVID-19 declaration under Section 564(b)(1) of the Act, 21 U.S.C. section 360bbb-3(b)(1), unless the authorization is terminated or revoked sooner. Performed at [facility], [place] [address], [place], [place] [address]      Labs: BNP (last 3 results) No results for input(s): BNP in the last 8760 hours.   Basic Metabolic Panel: Recent Labs  Lab 04/17/19 0750 04/18/19 0755 04/21/19 2359 04/22/19 0635 04/22/19 1600 04/22/19 1627 04/22/19 1711 04/23/19 0356  NA 127* 130* 131* 134* 129* 130* 131* 133*  K 4.8 4.5 4.8 5.0 6.7* 7.1* 5.6* 4.2  CL 87* 91* 92* 95*  --   --   --  94*  CO2 20* 22 20* 24  --   --   --  24  GLUCOSE 109* 91 101* 93 110* 196* 133* 119*  BUN 108* 71* 72* 73*  --   --   --  46*  CREATININE 14.95* 11.47* 13.54* 14.48*  --   --   --  10.21*  CALCIUM 8.9 8.8* 8.9 8.8*  --   --   --  8.6*  PHOS 4.8*  --   --   --   --   --   --  5.0*   Liver Function Tests: Recent Labs  Lab 04/17/19 0750 04/23/19 0356  ALBUMIN 2.7* 2.8*   No results for input(s): LIPASE, AMYLASE in the last 168 hours. No results for input(s): AMMONIA in the last 168 hours. CBC: Recent Labs  Lab 04/17/19 0750 04/21/19 2359 04/22/19 0635 04/22/19 1600 04/22/19 1627 04/22/19 1711 04/22/19 2222 04/23/19 0356  WBC 9.1 13.3* 10.8*  --   --   --   --  13.7*  NEUTROABS  --  11.3* 7.2  --   --   --   --   --   HGB 9.9* 7.6* 8.1* 7.5* 7.5* 7.8* 9.7* 9.5*  HCT 28.3* 22.9* 24.0* 22.0* 22.0* 23.0* 28.5* 27.6*  MCV 89.8 93.5 91.3  --   --   --   --  88.5  PLT 109* 257 197  --   --   --   --  198   Cardiac Enzymes: No results for input(s): CKTOTAL, CKMB,  CKMBINDEX, TROPONINI in the last 168 hours. BNP: Invalid input(s): POCBNP CBG: Recent Labs  Lab 04/22/19 1753 04/22/19 1840 04/22/19 1925 04/22/19 2033 04/22/19 2142  GLUCAP 109* 123* 110* 124* 151*   D-Dimer No results for input(s): DDIMER in the last 72 hours. Hgb A1c No results for input(s): HGBA1C in the last 72 hours. Lipid Profile No results for input(s): CHOL, HDL, LDLCALC, TRIG, CHOLHDL, LDLDIRECT in the last 72 hours. Thyroid function studies No results for input(s): TSH, T4TOTAL, T3FREE, THYROIDAB in the last 72 hours.  Invalid input(s): FREET3 Anemia work up Recent Labs    04/22/19 1130  TIBC 211*  IRON 38*   Urinalysis    Component Value Date/Time   COLORURINE YELLOW 08/04/2017 1824   APPEARANCEUR HAZY (A) 08/04/2017 1824   LABSPEC 1.009 08/04/2017 1824   PHURINE 9.0 (H) 08/04/2017 1824   GLUCOSEU 150 (A) 08/04/2017 1824   HGBUR NEGATIVE 08/04/2017 1824   BILIRUBINUR NEGATIVE 08/04/2017 [place] 08/04/2017 1824   PROTEINUR >=300 (A) 08/04/2017 1824   NITRITE NEGATIVE 08/04/2017 1824   LEUKOCYTESUR NEGATIVE 08/04/2017 1824   Sepsis Labs Invalid input(s): PROCALCITONIN,  WBC,  LACTICIDVEN Microbiology Recent Results (from the past 240 hour(s))  Culture, blood (routine x 2)     Status: Abnormal   Collection Time: 04/14/19  2:10 PM   Specimen: BLOOD  Result Value Ref Range Status   Specimen Description BLOOD LEFT WRIST  Final   Special Requests   Final    BOTTLES DRAWN AEROBIC AND ANAEROBIC Blood Culture adequate volume   Culture  Setup Time   Final    IN BOTH AEROBIC AND ANAEROBIC BOTTLES GRAM POSITIVE COCCI CRITICAL RESULT CALLED TO, READ BACK BY AND VERIFIED WITH: G. ABBOTT,PHARMD 0301 04/15/2019 Mena Goes Performed at [facility], [place] [address], [place],  [address]    Culture STAPHYLOCOCCUS AUREUS (A)  Final   Report Status 04/17/2019 FINAL  Final   Organism ID, Bacteria STAPHYLOCOCCUS AUREUS  Final       Susceptibility   Staphylococcus aureus - MIC*    CIPROFLOXACIN <=0.5 SENSITIVE Sensitive     ERYTHROMYCIN <=0.25 SENSITIVE Sensitive     GENTAMICIN <=0.5 SENSITIVE Sensitive     OXACILLIN <=0.25 SENSITIVE Sensitive     TETRACYCLINE <=1 SENSITIVE Sensitive     VANCOMYCIN 1 SENSITIVE Sensitive     TRIMETH/SULFA <=10 SENSITIVE Sensitive     CLINDAMYCIN <=0.25 SENSITIVE Sensitive     RIFAMPIN <=0.5 SENSITIVE Sensitive     Inducible Clindamycin NEGATIVE Sensitive     * STAPHYLOCOCCUS AUREUS  Blood Culture ID Panel (Reflexed)     Status: Abnormal   Collection Time: 04/14/19  2:10 PM  Result Value Ref Range Status   Enterococcus species NOT DETECTED NOT DETECTED Final   Listeria monocytogenes NOT DETECTED NOT DETECTED Final   Staphylococcus species DETECTED (A) NOT DETECTED Final    Comment: CRITICAL RESULT CALLED TO, READ BACK BY AND VERIFIED WITH: G. ABBOTT,PHARMD 0301 04/15/2019 T. TYSOR    Staphylococcus aureus (BCID) DETECTED (A) NOT DETECTED Final    Comment: Methicillin (oxacillin) susceptible Staphylococcus aureus (MSSA). Preferred therapy is anti staphylococcal beta lactam antibiotic (Cefazolin or Nafcillin), unless clinically contraindicated. CRITICAL RESULT CALLED TO, READ BACK BY AND VERIFIED WITH: G. ABBOTT,PHARMD 0301 04/15/2019 T. TYSOR    Methicillin resistance NOT DETECTED NOT DETECTED Final   Streptococcus species NOT DETECTED NOT DETECTED Final   Streptococcus  agalactiae NOT DETECTED NOT DETECTED Final   Streptococcus pneumoniae NOT DETECTED NOT DETECTED Final   Streptococcus pyogenes NOT DETECTED NOT DETECTED Final   Acinetobacter baumannii NOT DETECTED NOT DETECTED Final   Enterobacteriaceae species NOT DETECTED NOT DETECTED Final   Enterobacter cloacae complex NOT DETECTED NOT DETECTED Final   Escherichia coli NOT DETECTED NOT DETECTED Final   Klebsiella oxytoca NOT DETECTED NOT DETECTED Final   Klebsiella pneumoniae NOT DETECTED NOT DETECTED Final   Proteus  species NOT DETECTED NOT DETECTED Final   Serratia marcescens NOT DETECTED NOT DETECTED Final   Haemophilus influenzae NOT DETECTED NOT DETECTED Final   Neisseria meningitidis NOT DETECTED NOT DETECTED Final   Pseudomonas aeruginosa NOT DETECTED NOT DETECTED Final   Candida albicans NOT DETECTED NOT DETECTED Final   Candida glabrata NOT DETECTED NOT DETECTED Final   Candida krusei NOT DETECTED NOT DETECTED Final   Candida parapsilosis NOT DETECTED NOT DETECTED Final   Candida tropicalis NOT DETECTED NOT DETECTED Final    Comment: Performed at [facility], [place] [address] [place], [place] [address]  Culture, blood (routine x 2)     Status: Abnormal   Collection Time: 04/14/19  2:20 PM   Specimen: BLOOD  Result Value Ref Range Status   Specimen Description BLOOD BLOOD LEFT FOREARM  Final   Special Requests   Final    BOTTLES DRAWN AEROBIC AND ANAEROBIC Blood Culture results may not be optimal due to an inadequate volume of blood received in culture bottles   Culture  Setup Time   Final    GRAM POSITIVE COCCI IN CLUSTERS IN BOTH AEROBIC AND ANAEROBIC BOTTLES CRITICAL VALUE NOTED.  VALUE IS CONSISTENT WITH PREVIOUSLY REPORTED AND CALLED VALUE.    Culture (A)  Final    STAPHYLOCOCCUS AUREUS SUSCEPTIBILITIES PERFORMED ON PREVIOUS CULTURE WITHIN THE LAST 5 DAYS. Performed at [facility], [place] [address], [place], [place] [address]    Report Status 04/17/2019 FINAL  Final  SARS Coronavirus 2 (CEPHEID - Performed in [place] hospital lab), Hosp Order     Status: None   Collection Time: 04/14/19  2:29 PM   Specimen: Nasopharyngeal Swab  Result Value Ref Range Status   SARS Coronavirus 2 NEGATIVE NEGATIVE Final    Comment: (NOTE) If result is NEGATIVE SARS-CoV-2 target nucleic acids are NOT DETECTED. The SARS-CoV-2 RNA is generally detectable in upper and lower  respiratory specimens during the acute phase of infection. The lowest  concentration of SARS-CoV-2 viral copies  this assay can detect is 250  copies / mL. A negative result does not preclude SARS-CoV-2 infection  and should not be used as the sole basis for treatment or other  patient management decisions.  A negative result may occur with  improper specimen collection / handling, submission of specimen other  than nasopharyngeal swab, presence of viral mutation(s) within the  areas targeted by this assay, and inadequate number of viral copies  (<250 copies / mL). A negative result must be combined with clinical  observations, patient history, and epidemiological information. If result is POSITIVE SARS-CoV-2 target nucleic acids are DETECTED. The SARS-CoV-2 RNA is generally detectable in upper and lower  respiratory specimens dur ing the acute phase of infection.  Positive  results are indicative of active infection with SARS-CoV-2.  Clinical  correlation with patient history and other diagnostic information is  necessary to determine patient infection status.  Positive results do  not rule out bacterial infection or co-infection with other  viruses. If result is PRESUMPTIVE POSTIVE SARS-CoV-2 nucleic acids MAY BE PRESENT.   A presumptive positive result was obtained on the submitted specimen  and confirmed on repeat testing.  While 2019 novel coronavirus  (SARS-CoV-2) nucleic acids may be present in the submitted sample  additional confirmatory testing may be necessary for epidemiological  and / or clinical management purposes  to differentiate between  SARS-CoV-2 and other Sarbecovirus currently known to infect humans.  If clinically indicated additional testing with an alternate test  methodology [phone]) is advised. The SARS-CoV-2 RNA is generally  detectable in upper and lower respiratory sp ecimens during the acute  phase of infection. The expected result is Negative. Fact Sheet for Patients:  StrictlyIdeas.no Fact Sheet for Healthcare  Providers: BankingDealers.co.za This test is not yet approved or cleared by the [place] FDA and has been authorized for detection and/or diagnosis of SARS-CoV-2 by FDA under an Emergency Use Authorization (EUA).  This EUA will remain in effect (meaning this test can be used) for the duration of the COVID-19 declaration under Section 564(b)(1) of the Act, 21 U.S.C. section 360bbb-3(b)(1), unless the authorization is terminated or revoked sooner. Performed at [facility], [place] [address], [place], [place] [address]   Aerobic/Anaerobic Culture (surgical/deep wound)     Status: None   Collection Time: 04/15/19  9:28 PM   Specimen: Wound  Result Value Ref Range Status   Specimen Description WOUND RIGHT FOREARM  Final   Special Requests PT ON VANC  Final   Gram Stain   Final    RARE WBC PRESENT, PREDOMINANTLY PMN FEW GRAM POSITIVE COCCI    Culture   Final    MODERATE STAPHYLOCOCCUS AUREUS NO ANAEROBES ISOLATED Performed at [facility], [place] [address] [place], [place] [address]    Report Status 04/20/2019 FINAL  Final   Organism ID, Bacteria STAPHYLOCOCCUS AUREUS  Final      Susceptibility   Staphylococcus aureus - MIC*    CIPROFLOXACIN <=0.5 SENSITIVE Sensitive     ERYTHROMYCIN <=0.25 SENSITIVE Sensitive     GENTAMICIN <=0.5 SENSITIVE Sensitive     OXACILLIN <=0.25 SENSITIVE Sensitive     TETRACYCLINE <=1 SENSITIVE Sensitive     VANCOMYCIN <=0.5 SENSITIVE Sensitive     TRIMETH/SULFA <=10 SENSITIVE Sensitive     CLINDAMYCIN <=0.25 SENSITIVE Sensitive     RIFAMPIN <=0.5 SENSITIVE Sensitive     Inducible Clindamycin NEGATIVE Sensitive     * MODERATE STAPHYLOCOCCUS AUREUS  Culture, blood (routine x 2)     Status: None   Collection Time: 04/16/19  9:09 AM   Specimen: BLOOD  Result Value Ref Range Status   Specimen Description BLOOD LEFT ANTECUBITAL  Final   Special Requests   Final    BOTTLES DRAWN AEROBIC AND ANAEROBIC Blood Culture  adequate volume   Culture   Final    NO GROWTH 5 DAYS Performed at [facility], [place] [address] [place], [place] [address]    Report Status 04/21/2019 FINAL  Final  Culture, blood (routine x 2)     Status: None   Collection Time: 04/16/19  9:15 AM   Specimen: BLOOD RIGHT HAND  Result Value Ref Range Status   Specimen Description BLOOD RIGHT HAND  Final   Special Requests   Final    BOTTLES DRAWN AEROBIC ONLY Blood Culture adequate volume   Culture   Final    NO GROWTH 5 DAYS Performed at [facility], [place]  [address], [place], [place] [address]    Report Status 04/21/2019 FINAL  Final  Novel Coronavirus,NAA,(SEND-OUT TO REF LAB - TAT 24-48 hrs); Hosp Order     Status: None   Collection Time: 04/22/19  1:30 AM   Specimen: Nasopharyngeal Swab; Respiratory  Result Value Ref Range Status   SARS-CoV-2, NAA NOT DETECTED NOT DETECTED Final    Comment: (NOTE) This test was developed and its performance characteristics determined by [employer]. This test has not been FDA cleared or approved. This test has been authorized by FDA under an Emergency Use Authorization (EUA). This test is only authorized for the duration of time the declaration that circumstances exist justifying the authorization of the emergency use of in vitro diagnostic tests for detection of SARS-CoV-2 virus and/or diagnosis of COVID-19 infection under section 564(b)(1) of the Act, 21 U.S.C. 009FGH-8(E)(9), unless the authorization is terminated or revoked sooner. When diagnostic testing is negative, the possibility of a false negative result should be considered in the context of a patient's recent exposures and the presence of clinical signs and symptoms consistent with COVID-19. An individual without symptoms of COVID-19 and who is not shedding SARS-CoV-2 virus would expect to have a negative (not detected) result in this assay. Performed  At: [facility] [address] [place],  [place] [id number] Rush Farmer MD LF:[id number]    [place]  Final    Comment: Performed at [facility], [place] [address], [place], [place] [address]  MRSA PCR Screening     Status: None   Collection Time: 04/22/19  6:33 AM   Specimen: Nasopharyngeal  Result Value Ref Range Status   MRSA by PCR NEGATIVE NEGATIVE Final    Comment:        The GeneXpert MRSA Assay (FDA approved for NASAL specimens only), is one component of a comprehensive MRSA colonization surveillance program. It is not intended to diagnose MRSA infection nor to guide or monitor treatment for MRSA infections. Performed at [facility], [place] [address] [place], [place] [address]   SARS Coronavirus 2 (CEPHEID - Performed in [place] hospital lab), Hosp Order     Status: None   Collection Time: 04/22/19 10:17 AM   Specimen: Nasopharyngeal Swab  Result Value Ref Range Status   SARS Coronavirus 2 NEGATIVE NEGATIVE Final    Comment: (NOTE) If result is NEGATIVE SARS-CoV-2 target nucleic acids are NOT DETECTED. The SARS-CoV-2 RNA is generally detectable in upper and lower  respiratory specimens during the acute phase of infection. The lowest  concentration of SARS-CoV-2 viral copies this assay can detect is 250  copies / mL. A negative result does not preclude SARS-CoV-2 infection  and should not be used as the sole basis for treatment or other  patient management decisions.  A negative result may occur with  improper specimen collection / handling, submission of specimen other  than nasopharyngeal swab, presence of viral mutation(s) within the  areas targeted by this assay, and inadequate number of viral copies  (<250 copies / mL). A negative result must be combined with clinical  observations, patient history, and epidemiological information. If result is POSITIVE SARS-CoV-2 target nucleic acids are DETECTED. The SARS-CoV-2 RNA is generally detectable in upper and lower   respiratory specimens dur ing the acute phase of infection.  Positive  results are indicative of active infection with SARS-CoV-2.  Clinical  correlation with patient history and other diagnostic information is  necessary to determine patient infection status.  Positive results  do  not rule out bacterial infection or co-infection with other viruses. If result is PRESUMPTIVE POSTIVE SARS-CoV-2 nucleic acids MAY BE PRESENT.   A presumptive positive result was obtained on the submitted specimen  and confirmed on repeat testing.  While 2019 novel coronavirus  (SARS-CoV-2) nucleic acids may be present in the submitted sample  additional confirmatory testing may be necessary for epidemiological  and / or clinical management purposes  to differentiate between  SARS-CoV-2 and other Sarbecovirus currently known to infect humans.  If clinically indicated additional testing with an alternate test  methodology [phone]) is advised. The SARS-CoV-2 RNA is generally  detectable in upper and lower respiratory sp ecimens during the acute  phase of infection. The expected result is Negative. Fact Sheet for Patients:  StrictlyIdeas.no Fact Sheet for Healthcare Providers: BankingDealers.co.za This test is not yet approved or cleared by the [place] FDA and has been authorized for detection and/or diagnosis of SARS-CoV-2 by FDA under an Emergency Use Authorization (EUA).  This EUA will remain in effect (meaning this test can be used) for the duration of the COVID-19 declaration under Section 564(b)(1) of the Act, 21 U.S.C. section 360bbb-3(b)(1), unless the authorization is terminated or revoked sooner. Performed at [facility], [place] [address], [place], [place] [address]    Time coordinating discharge: Over 30 minutes  SIGNED:  Little Ishikawa, DO [facility] 04/23/2019, 10:53 AM Pager

## 2019-04-23 NOTE — Progress Notes (Signed)
Pt noted with BP of 210/100 ( manual ). Provider notified.

## 2019-04-23 NOTE — Progress Notes (Signed)
  Progress Note    04/23/2019 10:14 AM 1 Day Post-Op  Subjective: Feeling much better today  Vitals:   04/23/19 0623 04/23/19 0844  BP: (!) 184/93 (!) 190/103  Pulse: 76 76  Resp:  20  Temp:    SpO2:  98%    Physical Exam: [id number] Right arm incision is healing well with staples. Palpable right radial pulse   CBC    Component Value Date/Time   WBC 13.7 (H) 04/23/2019 0356   RBC 3.12 (L) 04/23/2019 0356   HGB 9.5 (L) 04/23/2019 0356   HCT 27.6 (L) 04/23/2019 0356   PLT 198 04/23/2019 0356   MCV 88.5 04/23/2019 0356   MCH 30.4 04/23/2019 0356   MCHC 34.4 04/23/2019 0356   RDW 16.8 (H) 04/23/2019 0356   LYMPHSABS 1.4 04/22/2019 0635   MONOABS 1.5 (H) 04/22/2019 0635   EOSABS 0.2 04/22/2019 0635   BASOSABS 0.0 04/22/2019 0635    BMET    Component Value Date/Time   NA 133 (L) 04/23/2019 0356   K 4.2 04/23/2019 0356   CL 94 (L) 04/23/2019 0356   CO2 24 04/23/2019 0356   GLUCOSE 119 (H) 04/23/2019 0356   BUN 46 (H) 04/23/2019 0356   CREATININE 10.21 (H) 04/23/2019 0356   CALCIUM 8.6 (L) 04/23/2019 0356   GFRNONAA 5 (L) 04/23/2019 0356   GFRAA 6 (L) 04/23/2019 0356    INR    Component Value Date/Time   INR 1.2 04/23/2019 0356     Intake/Output Summary (Last 24 hours) at 04/23/2019 1014 Last data filed at 04/23/2019 0958 Gross per 24 hour  Intake 2032.67 ml  Output 3300 ml  Net -1267.33 ml     Assessment:  58 y.o. male is s/p TDC and right arm graft excision with patch angioplasty brachial artery.  Palpable right radial pulse.  Okay for discharge from vascular standpoint.  We will follow-up in 2 to 3 weeks for staple removal and we will plan for possible single stage basilic vein fistula at that time.   Brandon C. Donzetta Matters, MD [facility] Office: [phone] Pager: [phone]  04/23/2019 10:14 AM

## 2019-04-23 NOTE — Progress Notes (Signed)
Per PCP, pt took generic vantin 25 mg for 5 days prior to admission with the doxycycline. Pt was also not on metformin.

## 2019-04-23 NOTE — Progress Notes (Signed)
Pt returned to floor post dialysis via bed. Alert and oriented x4, no distress noted, c/o 9/10 pain to right arm. PRN med given.

## 2019-04-23 NOTE — Progress Notes (Signed)
[facility] ROUNDING NOTE   Subjective:   This is a 58 year old gentleman with a history of hypertension end-stage renal disease right forearm AV fistula usual dialysis days Tuesday Thursday Saturday he has a history of CVA 12/20/2016.  History of hepatitis C status post treatment 08/02/2017 and history of hypertension that has been poorly controlled due to poor Dayarmys to antihypertensive medications.  He was recently admitted and discharged 04/18/2019 to complete a 6-week course of IV Ancef for MSSA bacteremia.  With a stop date August 7 he had a negative TEE.He presented to the emergency room with an ulcerated segment along the medial limb of the left forearm graft.  Hyperkalemia postoperatively required dialysis 04/21/2022 tunneled dialysis catheter.  Appreciate assistance from Dr. Trula Slade  Blood pressure 184/93 pulse 76 temperature 98.8 O2 sats 97% nasal cannula  Amlodipine 10 mg daily hydralazine 100 mg 3 times daily labetalol 300 mg 3 times daily Renvela 800 mg 3 times daily, darbepoetin 100 mcg every Tuesday  Sodium 133 potassium 4.2 chloride 94 CO2 24 BUN 46 creatinine 10.2 glucose 119 calcium 8.6 phosphorus 5.0 albumin 2.8 WBC 13.7 hemoglobin 9.5 platelets 198  IV Ancef 2 g 3 times weekly with dialysis       Objective:  Vital signs in last 24 hours:  Temp:  [97.2 F (36.2 C)-98.8 F (37.1 C)] 98.8 F (37.1 C) (07/01 0457) Pulse Rate:  [67-96] 76 (07/01 0623) Resp:  [11-21] 19 (07/01 0457) BP: (136-215)/(81-129) 184/93 (07/01 0623) SpO2:  [93 %-100 %] 97 % (07/01 0457) Weight:  [90.1 kg-92.4 kg] 90.8 kg (07/01 0300)  Weight change: 0.9 kg Filed Weights   04/22/19 2315 04/23/19 0220 04/23/19 0300  Weight: 92.4 kg 90.1 kg 90.8 kg    Intake/Output: I/O last 3 completed shifts: In: 1912.7 [P.O.:120; I.V.:800; Blood:742.7; IV Piggyback:250] Out: 3300 [Other:2300; Blood:1000]   Intake/Output this shift:  No intake/output data recorded.  CVS- RRR no  murmurs rubs or gallops RS- CTA no wheezes or rales tunneled IJ catheter ABD- BS present soft non-distended EXT-no edema left arm wrapped   Basic Metabolic Panel: Recent Labs  Lab 04/17/19 0750 04/18/19 0755 04/21/19 2359 04/22/19 0635 04/22/19 1600 04/22/19 1627 04/22/19 1711 04/23/19 0356  NA 127* 130* 131* 134* 129* 130* 131* 133*  K 4.8 4.5 4.8 5.0 6.7* 7.1* 5.6* 4.2  CL 87* 91* 92* 95*  --   --   --  94*  CO2 20* 22 20* 24  --   --   --  24  GLUCOSE 109* 91 101* 93 110* 196* 133* 119*  BUN 108* 71* 72* 73*  --   --   --  46*  CREATININE 14.95* 11.47* 13.54* 14.48*  --   --   --  10.21*  CALCIUM 8.9 8.8* 8.9 8.8*  --   --   --  8.6*  PHOS 4.8*  --   --   --   --   --   --  5.0*    Liver Function Tests: Recent Labs  Lab 04/17/19 0750 04/23/19 0356  ALBUMIN 2.7* 2.8*   No results for input(s): LIPASE, AMYLASE in the last 168 hours. No results for input(s): AMMONIA in the last 168 hours.  CBC: Recent Labs  Lab 04/17/19 0750 04/21/19 2359 04/22/19 0635 04/22/19 1600 04/22/19 1627 04/22/19 1711 04/22/19 2222 04/23/19 0356  WBC 9.1 13.3* 10.8*  --   --   --   --  13.7*  NEUTROABS  --  11.3* 7.2  --   --   --   --   --  HGB 9.9* 7.6* 8.1* 7.5* 7.5* 7.8* 9.7* 9.5*  HCT 28.3* 22.9* 24.0* 22.0* 22.0* 23.0* 28.5* 27.6*  MCV 89.8 93.5 91.3  --   --   --   --  88.5  PLT 109* 257 197  --   --   --   --  198    Cardiac Enzymes: No results for input(s): CKTOTAL, CKMB, CKMBINDEX, TROPONINI in the last 168 hours.  BNP: Invalid input(s): POCBNP  CBG: Recent Labs  Lab 04/22/19 1753 04/22/19 1840 04/22/19 1925 04/22/19 2033 04/22/19 2142  GLUCAP 109* 123* 110* 46* 151*    Microbiology: Results for orders placed or performed during the hospital encounter of 04/21/19  MRSA PCR Screening     Status: None   Collection Time: 04/22/19  6:33 AM   Specimen: Nasopharyngeal  Result Value Ref Range Status   MRSA by PCR NEGATIVE NEGATIVE Final    Comment:         The GeneXpert MRSA Assay (FDA approved for NASAL specimens only), is one component of a comprehensive MRSA colonization surveillance program. It is not intended to diagnose MRSA infection nor to guide or monitor treatment for MRSA infections. Performed at [facility], [place] [address] [place], [place] [address]   SARS Coronavirus 2 (CEPHEID - Performed in [place] hospital lab), Hosp Order     Status: None   Collection Time: 04/22/19 10:17 AM   Specimen: Nasopharyngeal Swab  Result Value Ref Range Status   SARS Coronavirus 2 NEGATIVE NEGATIVE Final    Comment: (NOTE) If result is NEGATIVE SARS-CoV-2 target nucleic acids are NOT DETECTED. The SARS-CoV-2 RNA is generally detectable in upper and lower  respiratory specimens during the acute phase of infection. The lowest  concentration of SARS-CoV-2 viral copies this assay can detect is 250  copies / mL. A negative result does not preclude SARS-CoV-2 infection  and should not be used as the sole basis for treatment or other  patient management decisions.  A negative result may occur with  improper specimen collection / handling, submission of specimen other  than nasopharyngeal swab, presence of viral mutation(s) within the  areas targeted by this assay, and inadequate number of viral copies  (<250 copies / mL). A negative result must be combined with clinical  observations, patient history, and epidemiological information. If result is POSITIVE SARS-CoV-2 target nucleic acids are DETECTED. The SARS-CoV-2 RNA is generally detectable in upper and lower  respiratory specimens dur ing the acute phase of infection.  Positive  results are indicative of active infection with SARS-CoV-2.  Clinical  correlation with patient history and other diagnostic information is  necessary to determine patient infection status.  Positive results do  not rule out bacterial infection or co-infection with other viruses. If result is  PRESUMPTIVE POSTIVE SARS-CoV-2 nucleic acids MAY BE PRESENT.   A presumptive positive result was obtained on the submitted specimen  and confirmed on repeat testing.  While 2019 novel coronavirus  (SARS-CoV-2) nucleic acids may be present in the submitted sample  additional confirmatory testing may be necessary for epidemiological  and / or clinical management purposes  to differentiate between  SARS-CoV-2 and other Sarbecovirus currently known to infect humans.  If clinically indicated additional testing with an alternate test  methodology [phone]) is advised. The SARS-CoV-2 RNA is generally  detectable in upper and lower respiratory sp ecimens during the acute  phase of infection. The expected result is Negative. Fact Sheet for Patients:  StrictlyIdeas.no Fact Sheet  for Healthcare Providers: BankingDealers.co.za This test is not yet approved or cleared by the [place] and has been authorized for detection and/or diagnosis of SARS-CoV-2 by FDA under an Emergency Use Authorization (EUA).  This EUA will remain in effect (meaning this test can be used) for the duration of the COVID-19 declaration under Section 564(b)(1) of the Act, 21 U.S.C. section 360bbb-3(b)(1), unless the authorization is terminated or revoked sooner. Performed at [facility], [place] [address] [place], [place] [address]     Coagulation Studies: Recent Labs    04/22/19 0145 04/23/19 0356  LABPROT 14.8 14.7  INR 1.2 1.2    Urinalysis: No results for input(s): COLORURINE, LABSPEC, PHURINE, GLUCOSEU, HGBUR, BILIRUBINUR, KETONESUR, PROTEINUR, UROBILINOGEN, NITRITE, LEUKOCYTESUR in the last 72 hours.  Invalid input(s): APPERANCEUR    Imaging: Dg Chest Port 1v Same Day  Result Date: 04/22/2019 CLINICAL DATA:  Dialysis catheter placement. EXAM: PORTABLE CHEST 1 VIEW COMPARISON:  04/14/2019 FINDINGS: The heart size is moderately enlarged. Aortic  atherosclerosis noted. Interval placement of right sided dialysis catheter with tip at the cavoatrial junction. Nodular opacity within the right middle lobe as seen on recent chest CT is again noted measuring approximately 2.8 cm and is likely post infectious or inflammatory. Pulmonary vascular congestion is noted. No pleural effusion. IMPRESSION: 1. No complications after right chest wall dialysis catheter. Tip projects over the cavoatrial junction. 2. Nodular opacity in the right middle lobe, unchanged and likely representing asymmetric edema or infection. Electronically Signed   By: Kerby Moors M.D.   On: 04/22/2019 20:32   Dg Fluoro Guide Cv Line-no Report  Result Date: 04/22/2019 Fluoroscopy was utilized by the requesting physician.  No radiographic interpretation.     Medications:   . sodium chloride    . sodium chloride    . sodium chloride    . sodium chloride    .  ceFAZolin (ANCEF) IV 2 g (04/23/19 0150)   . sodium chloride   Intravenous Once  . amLODipine  10 mg Oral QHS  . Chlorhexidine Gluconate Cloth  6 each Topical Q0600  . darbepoetin (ARANESP) injection - DIALYSIS  100 mcg Intravenous Q Tue-HD  . hydrALAZINE  100 mg Oral TID  . labetalol  300 mg Oral TID  . sevelamer carbonate  800 mg Oral TID WC  . sodium chloride flush  3 mL Intravenous Q12H  . sodium chloride flush  3 mL Intravenous Q12H   sodium chloride, sodium chloride, sodium chloride, acetaminophen **OR** acetaminophen, alteplase, fentaNYL (SUBLIMAZE) injection, heparin, lidocaine (PF), lidocaine-prilocaine, ondansetron **OR** ondansetron (ZOFRAN) IV, pentafluoroprop-tetrafluoroeth, sodium chloride flush   As per kidney center prescription 2K 2.25 calcium  estimated dry weight 86 kg  micera-50 mcg every 2 weeks  Assessment/ Plan:   ESRD-Tuesday Thursday dialysis [facility].  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  Will add ARB would recommend losartan 100 mg daily to medications.  Nonspecific allergy noted to lisinopril ACE inhibitor  ACCESS-we will need dialysis catheter.  MSSA infection forearm graft.  Continue Ancef 2 g q. HD until May 30, 2019  Ruptured AV graft appreciate assistance of Dr. Carlis Abbott   LOS: [place] @TODAY @8 :27 AM

## 2019-04-23 NOTE — Progress Notes (Signed)
Renal Navigator notified OP HD clinic/[place] of patient's hospitalization and negative COVID 19 rapid test result in order to provide continuity of care and safety.  Alphonzo Cruise, [place] [facility]al Navigator [phone]

## 2019-04-23 NOTE — Anesthesia Postprocedure Evaluation (Signed)
Anesthesia Post Note  Patient: Leobardo Granlund  Procedure(s) Performed: TUNNEL DIALYSIS CATHETER (Right Chest) REVISION OF FOREARM ARTERIOVENOUS GORETEX GRAFT (Right )     Patient location during evaluation: PACU Anesthesia Type: General Level of consciousness: awake and alert Pain management: pain level controlled Vital Signs Assessment: post-procedure vital signs reviewed and stable Respiratory status: spontaneous breathing, nonlabored ventilation, respiratory function stable and patient connected to nasal cannula oxygen Cardiovascular status: blood pressure returned to baseline and stable Postop Assessment: no apparent nausea or vomiting Anesthetic complications: no    Last Vitals:  Vitals:   04/23/19 0623 04/23/19 0844  BP: (!) 184/93 (!) 190/103  Pulse: 76 76  Resp:  20  Temp:    SpO2:  98%    Last Pain:  Vitals:   04/23/19 0920  TempSrc:   PainSc: 9                  Kalliope Riesen DAVID

## 2019-04-23 NOTE — Progress Notes (Signed)
Pt noted with BP of 184/93, provider notified, no new orders at this time.

## 2019-04-29 ENCOUNTER — Telehealth (HOSPITAL_COMMUNITY): Payer: Self-pay | Admitting: Rehabilitation

## 2019-04-29 NOTE — Telephone Encounter (Signed)

## 2019-04-30 ENCOUNTER — Encounter: Payer: Self-pay | Admitting: Vascular Surgery

## 2019-04-30 ENCOUNTER — Other Ambulatory Visit: Payer: Self-pay

## 2019-04-30 ENCOUNTER — Ambulatory Visit (INDEPENDENT_AMBULATORY_CARE_PROVIDER_SITE_OTHER): Payer: Medicare Other | Admitting: Vascular Surgery

## 2019-04-30 VITALS — BP 146/86 | HR 67 | Temp 97.4°F | Resp 20 | Ht 75.5 in | Wt 198.4 lb

## 2019-04-30 DIAGNOSIS — N186 End stage renal disease: Secondary | ICD-10-CM

## 2019-04-30 DIAGNOSIS — Z992 Dependence on renal dialysis: Secondary | ICD-10-CM

## 2019-04-30 NOTE — Progress Notes (Signed)
Patient name: Frank Fuller MRN: [id number] DOB: May 15, 1961 Sex: male  REASON FOR VISIT:   Status post removal of infected right forearm graft:  HPI:   Frank Fuller is a pleasant 58 y.o. male who had presented with a possibly infected right forearm graft.  I explored proximal and distal to the wound and the graft here was incorporated so I replaced the venous half of the graft with an interposition 7 mm PTFE graft.  This was on 04/15/2019.  On 04/22/2019 the patient required excision of his right forearm graft with patch angioplasty of the right brachial artery and placement of a tunneled dialysis catheter.  He is now 8 days postop.  The patient has moderate discomfort but overall is doing well.  Current Outpatient Medications  Medication Sig Dispense Refill  . acetaminophen (TYLENOL) 500 MG tablet Take 1,000 mg by mouth every 6 (six) hours as needed for fever or headache (pain).    Marland Kitchen amLODipine (NORVASC) 10 MG tablet Take 10 mg by mouth at bedtime.     Marland Kitchen aspirin EC 81 MG tablet Take 81 mg by mouth every evening.     Marland Kitchen ceFAZolin (ANCEF) 2-4 GM/100ML-% IVPB Inject 100 mLs (2 g total) into the vein Every Tuesday,Thursday,and Saturday with dialysis for 16 doses. 1 each 0  . Ensure (ENSURE) Take 237 mLs by mouth 2 (two) times a day.    . ferric citrate (AURYXIA) 1 GM 210 MG(Fe) tablet Take 420-630 mg by mouth See admin instructions. Take 3 tablets (630 mg) by mouth three times daily with meals, take 2 tablets (420 mg) with snacks    . hydrALAZINE (APRESOLINE) 100 MG tablet Take 100 mg by mouth 3 (three) times daily. Hold for systolic blood pressure 169/67  11  . HYDROcodone-acetaminophen (NORCO) 5-325 MG tablet Take 1 tablet by mouth every 6 (six) hours as needed for moderate pain. 15 tablet 0  . labetalol (NORMODYNE) 300 MG tablet Take 300 mg by mouth 3 (three) times daily.   11  . lidocaine-prilocaine (EMLA) cream Apply 1 application topically See admin instructions. Apply topically three  times week (Tuesday, Thursday, Saturday) prior to port access    . losartan (COZAAR) 100 MG tablet Take 1 tablet (100 mg total) by mouth daily. 30 tablet 0  . sevelamer carbonate (RENVELA) 800 MG tablet Take 1 tablet (800 mg total) by mouth 3 (three) times daily with meals for 30 days. 90 tablet 0   No current facility-administered medications for this visit.     REVIEW OF SYSTEMS:  [X]  denotes positive finding, [ ]  denotes negative finding Vascular    Leg swelling    Cardiac    Chest pain or chest pressure:    Shortness of breath upon exertion:    Short of breath when lying flat:    Irregular heart rhythm:    Constitutional    Fever or chills:     PHYSICAL EXAM:   Vitals:   04/30/19 1050  BP: (!) 146/86  Pulse: 67  Resp: 20  Temp: (!) 97.4 F (36.3 C)  TempSrc: Temporal  SpO2: 97%  Weight: 198 lb 6.4 oz (90 kg)  Height: 6' 3.5" (1.918 m)    GENERAL: The patient is a well-nourished male, in no acute distress. The vital signs are documented above. CARDIOVASCULAR: There is a regular rate and rhythm. PULMONARY: There is good air exchange bilaterally without wheezing or rales. His arm incisions are healing nicely.  He has a palpable right radial pulse.  He has a palpable left radial pulse.  DATA:   No new data.  MEDICAL ISSUES:   STATUS POST REMOVAL RIGHT FOREARM AV GRAFT: This patient required removal of his entire right forearm graft for infection and had vein patch angioplasty of the brachial artery.  The patient is concerned about having his staples taken out today as it is only been 8 days and I completely agree with this assessment.  He will have to come back next week for staple removal.  At that time we will obtain a vein map of the left arm so we can start evaluating for new access in the left arm once the right arm incisions have completely healed.  For now he has a functioning right IJ tunneled dialysis catheter.  [name] [facility] [phone]

## 2019-05-02 ENCOUNTER — Other Ambulatory Visit: Payer: Self-pay

## 2019-05-02 DIAGNOSIS — Z992 Dependence on renal dialysis: Secondary | ICD-10-CM

## 2019-05-02 DIAGNOSIS — N186 End stage renal disease: Secondary | ICD-10-CM

## 2019-05-02 NOTE — Addendum Note (Signed)
Addended by: York Cerise C on: 05/02/2019 03:46 PM   Modules accepted: Orders

## 2019-05-07 ENCOUNTER — Other Ambulatory Visit: Payer: Self-pay

## 2019-05-07 DIAGNOSIS — N186 End stage renal disease: Secondary | ICD-10-CM

## 2019-05-07 DIAGNOSIS — Z992 Dependence on renal dialysis: Secondary | ICD-10-CM

## 2019-05-08 ENCOUNTER — Telehealth (HOSPITAL_COMMUNITY): Payer: Self-pay

## 2019-05-08 NOTE — Telephone Encounter (Signed)
Patient responded "No" to all Covid screening questions.

## 2019-05-08 NOTE — Telephone Encounter (Signed)
The above patient or their representative was contacted and gave the following answers to these questions:         Do you have any of the following symptoms?  Fever                    Cough                   Shortness of breath  Do  you have any of the following other symptoms?    muscle pain         vomiting,        diarrhea        rash         weakness        red eye        abdominal pain         bruising          bruising or bleeding              joint pain           severe headache    Have you been in contact with someone who was or has been sick in the past 2 weeks?  Yes                 Unsure                         Unable to assess   Does the person that you were in contact with have any of the following symptoms?   Cough         shortness of breath           muscle pain         vomiting,            diarrhea            rash            weakness           fever            red eye           abdominal pain           bruising  or  bleeding                joint pain                severe headache               Have you  or someone you have been in contact with traveled internationally in th last month?         If yes, which countries?   Have you  or someone you have been in contact with traveled outside [place] in th last month?         If yes, which state and city?   COMMENTS OR ACTION PLAN FOR THIS PATIENT:

## 2019-05-09 ENCOUNTER — Ambulatory Visit (HOSPITAL_COMMUNITY)
Admission: RE | Admit: 2019-05-09 | Discharge: 2019-05-09 | Disposition: A | Discharge status: Home / Self Care | Payer: Medicare Other | Source: Ambulatory Visit | Attending: Vascular Surgery | Admitting: Vascular Surgery

## 2019-05-09 ENCOUNTER — Ambulatory Visit (INDEPENDENT_AMBULATORY_CARE_PROVIDER_SITE_OTHER): Payer: Self-pay | Admitting: Vascular Surgery

## 2019-05-09 ENCOUNTER — Encounter: Payer: Self-pay | Admitting: Vascular Surgery

## 2019-05-09 ENCOUNTER — Other Ambulatory Visit: Payer: Self-pay

## 2019-05-09 ENCOUNTER — Encounter: Payer: Medicare Other | Admitting: Vascular Surgery

## 2019-05-09 ENCOUNTER — Ambulatory Visit (INDEPENDENT_AMBULATORY_CARE_PROVIDER_SITE_OTHER)
Admission: RE | Admit: 2019-05-09 | Discharge: 2019-05-09 | Disposition: A | Discharge status: Home / Self Care | Payer: Medicare Other | Source: Ambulatory Visit | Attending: Vascular Surgery | Admitting: Vascular Surgery

## 2019-05-09 ENCOUNTER — Inpatient Hospital Stay (HOSPITAL_COMMUNITY): Admission: RE | Admit: 2019-05-09 | Payer: Medicare Other | Source: Ambulatory Visit

## 2019-05-09 VITALS — BP 201/123 | HR 56 | Temp 97.6°F | Resp 20 | Ht 75.5 in | Wt 200.8 lb

## 2019-05-09 DIAGNOSIS — Z992 Dependence on renal dialysis: Secondary | ICD-10-CM

## 2019-05-09 DIAGNOSIS — N186 End stage renal disease: Secondary | ICD-10-CM
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
## 2019-05-19 ENCOUNTER — Inpatient Hospital Stay: Payer: Medicare Other | Admitting: Internal Medicine

## 2019-05-26 ENCOUNTER — Inpatient Hospital Stay (HOSPITAL_COMMUNITY): Payer: Medicare Other

## 2019-05-26 ENCOUNTER — Encounter (HOSPITAL_COMMUNITY): Payer: Self-pay | Admitting: Emergency Medicine

## 2019-05-26 ENCOUNTER — Other Ambulatory Visit: Payer: Self-pay

## 2019-05-26 ENCOUNTER — Inpatient Hospital Stay (HOSPITAL_COMMUNITY)
Admission: EM | Admit: 2019-05-26 | Discharge: 2019-05-31 | DRG: 299 | Discharge status: Against Medical Advice | Payer: Medicare Other | Source: Other Acute Inpatient Hospital | Attending: Surgery | Admitting: Surgery

## 2019-05-26 DIAGNOSIS — I161 Hypertensive emergency: Secondary | ICD-10-CM | POA: Diagnosis present

## 2019-05-26 DIAGNOSIS — Z79891 Long term (current) use of opiate analgesic: Secondary | ICD-10-CM | POA: Diagnosis not present

## 2019-05-26 DIAGNOSIS — Z9114 Patient's other noncompliance with medication regimen: Secondary | ICD-10-CM

## 2019-05-26 DIAGNOSIS — I7102 Dissection of abdominal aorta: Secondary | ICD-10-CM

## 2019-05-26 DIAGNOSIS — Z20828 Contact with and (suspected) exposure to other viral communicable diseases: Secondary | ICD-10-CM | POA: Diagnosis present

## 2019-05-26 DIAGNOSIS — G8222 Paraplegia, incomplete: Secondary | ICD-10-CM | POA: Diagnosis not present

## 2019-05-26 DIAGNOSIS — I7100 Dissection of unspecified site of aorta: Secondary | ICD-10-CM | POA: Diagnosis not present

## 2019-05-26 DIAGNOSIS — I442 Atrioventricular block, complete: Secondary | ICD-10-CM | POA: Diagnosis present

## 2019-05-26 DIAGNOSIS — I120 Hypertensive chronic kidney disease with stage 5 chronic kidney disease or end stage renal disease: Secondary | ICD-10-CM | POA: Diagnosis present

## 2019-05-26 DIAGNOSIS — I7103 Dissection of thoracoabdominal aorta: Secondary | ICD-10-CM | POA: Diagnosis not present

## 2019-05-26 DIAGNOSIS — F329 Major depressive disorder, single episode, unspecified: Secondary | ICD-10-CM | POA: Diagnosis present

## 2019-05-26 DIAGNOSIS — J439 Emphysema, unspecified: Secondary | ICD-10-CM | POA: Diagnosis present

## 2019-05-26 DIAGNOSIS — Z79899 Other long term (current) drug therapy: Secondary | ICD-10-CM

## 2019-05-26 DIAGNOSIS — Z8249 Family history of ischemic heart disease and other diseases of the circulatory system: Secondary | ICD-10-CM

## 2019-05-26 DIAGNOSIS — I71019 Dissection of thoracic aorta, unspecified: Secondary | ICD-10-CM | POA: Diagnosis present

## 2019-05-26 DIAGNOSIS — H409 Unspecified glaucoma: Secondary | ICD-10-CM | POA: Diagnosis not present

## 2019-05-26 DIAGNOSIS — Z992 Dependence on renal dialysis: Secondary | ICD-10-CM

## 2019-05-26 DIAGNOSIS — G8220 Paraplegia, unspecified: Secondary | ICD-10-CM | POA: Diagnosis not present

## 2019-05-26 DIAGNOSIS — I7101 Dissection of thoracic aorta: Principal | ICD-10-CM | POA: Diagnosis present

## 2019-05-26 DIAGNOSIS — F17210 Nicotine dependence, cigarettes, uncomplicated: Secondary | ICD-10-CM | POA: Diagnosis present

## 2019-05-26 DIAGNOSIS — Z885 Allergy status to narcotic agent status: Secondary | ICD-10-CM | POA: Diagnosis not present

## 2019-05-26 DIAGNOSIS — N2581 Secondary hyperparathyroidism of renal origin: Secondary | ICD-10-CM | POA: Diagnosis present

## 2019-05-26 DIAGNOSIS — Z8673 Personal history of transient ischemic attack (TIA), and cerebral infarction without residual deficits: Secondary | ICD-10-CM

## 2019-05-26 DIAGNOSIS — Z452 Encounter for adjustment and management of vascular access device: Secondary | ICD-10-CM | POA: Diagnosis not present

## 2019-05-26 DIAGNOSIS — D631 Anemia in chronic kidney disease: Secondary | ICD-10-CM | POA: Diagnosis present

## 2019-05-26 DIAGNOSIS — Z5329 Procedure and treatment not carried out because of patient's decision for other reasons: Secondary | ICD-10-CM | POA: Diagnosis not present

## 2019-05-26 DIAGNOSIS — E8889 Other specified metabolic disorders: Secondary | ICD-10-CM | POA: Diagnosis present

## 2019-05-26 DIAGNOSIS — Z888 Allergy status to other drugs, medicaments and biological substances status: Secondary | ICD-10-CM

## 2019-05-26 DIAGNOSIS — N186 End stage renal disease: Secondary | ICD-10-CM | POA: Diagnosis present

## 2019-05-26 DIAGNOSIS — R0602 Shortness of breath: Secondary | ICD-10-CM

## 2019-05-26 DIAGNOSIS — Z7982 Long term (current) use of aspirin: Secondary | ICD-10-CM

## 2019-05-26 DIAGNOSIS — I10 Essential (primary) hypertension: Secondary | ICD-10-CM

## 2019-05-26 DIAGNOSIS — Z720 Tobacco use: Secondary | ICD-10-CM | POA: Diagnosis not present

## 2019-05-26 DIAGNOSIS — Z8619 Personal history of other infectious and parasitic diseases: Secondary | ICD-10-CM

## 2019-05-26 DIAGNOSIS — R609 Edema, unspecified: Secondary | ICD-10-CM | POA: Diagnosis not present

## 2019-05-26 DIAGNOSIS — G9340 Encephalopathy, unspecified: Secondary | ICD-10-CM | POA: Diagnosis not present

## 2019-05-26 DIAGNOSIS — I714 Abdominal aortic aneurysm, without rupture: Secondary | ICD-10-CM | POA: Diagnosis present

## 2019-05-26 DIAGNOSIS — Z8701 Personal history of pneumonia (recurrent): Secondary | ICD-10-CM

## 2019-05-26 DIAGNOSIS — I160 Hypertensive urgency: Secondary | ICD-10-CM | POA: Diagnosis not present

## 2019-05-26 DIAGNOSIS — E875 Hyperkalemia: Secondary | ICD-10-CM | POA: Diagnosis present

## 2019-05-26 HISTORY — DX: Ventricular tachycardia: I47.2

## 2019-05-26 HISTORY — DX: Atrioventricular block, second degree: I44.1

## 2019-05-26 HISTORY — DX: Other specified abnormal findings of blood chemistry: R79.89

## 2019-05-26 HISTORY — DX: Thrombocytopenia, unspecified: D69.6

## 2019-05-26 HISTORY — DX: Abdominal aortic aneurysm, without rupture: I71.4

## 2019-05-26 HISTORY — DX: Abdominal aortic aneurysm, without rupture, unspecified: I71.40

## 2019-05-26 HISTORY — DX: Anemia, unspecified: D64.9

## 2019-05-26 HISTORY — DX: Tachycardia, unspecified: R00.0

## 2019-05-26 HISTORY — DX: Atrioventricular block, first degree: I44.0

## 2019-05-26 LAB — CBC WITH DIFFERENTIAL/PLATELET
Abs Immature Granulocytes: 0.03 10*3/uL (ref 0.00–0.07)
Basophils Absolute: 0 10*3/uL (ref 0.0–0.1)
Basophils Relative: 0 %
Eosinophils Absolute: 0.2 10*3/uL (ref 0.0–0.5)
Eosinophils Relative: 3 %
HCT: 34.2 % — ABNORMAL LOW (ref 39.0–52.0)
Hemoglobin: 11 g/dL — ABNORMAL LOW (ref 13.0–17.0)
Immature Granulocytes: 0 %
Lymphocytes Relative: 9 %
Lymphs Abs: 0.8 10*3/uL (ref 0.7–4.0)
MCH: 31.5 pg (ref 26.0–34.0)
MCHC: 32.2 g/dL (ref 30.0–36.0)
MCV: 98 fL (ref 80.0–100.0)
Monocytes Absolute: 0.8 10*3/uL (ref 0.1–1.0)
Monocytes Relative: 10 %
Neutro Abs: 6.3 10*3/uL (ref 1.7–7.7)
Neutrophils Relative %: 78 %
Platelets: 169 10*3/uL (ref 150–400)
RBC: 3.49 MIL/uL — ABNORMAL LOW (ref 4.22–5.81)
RDW: 16.9 % — ABNORMAL HIGH (ref 11.5–15.5)
WBC: 8.2 10*3/uL (ref 4.0–10.5)
nRBC: 0 % (ref 0.0–0.2)

## 2019-05-26 LAB — MRSA PCR SCREENING: MRSA by PCR: NEGATIVE

## 2019-05-26 LAB — BASIC METABOLIC PANEL
Anion gap: 16 — ABNORMAL HIGH (ref 5–15)
BUN: 66 mg/dL — ABNORMAL HIGH (ref 6–20)
CO2: 22 mmol/L (ref 22–32)
Calcium: 9.4 mg/dL (ref 8.9–10.3)
Chloride: 99 mmol/L (ref 98–111)
Creatinine, Ser: 13.33 mg/dL — ABNORMAL HIGH (ref 0.61–1.24)
GFR calc Af Amer: 4 mL/min — ABNORMAL LOW (ref >60)
GFR calc non Af Amer: 4 mL/min — ABNORMAL LOW (ref >60)
Glucose, Bld: 107 mg/dL — ABNORMAL HIGH (ref 70–99)
Potassium: 5.9 mmol/L — ABNORMAL HIGH (ref 3.5–5.1)
Sodium: 137 mmol/L (ref 135–145)

## 2019-05-26 LAB — SARS CORONAVIRUS 2 BY RT PCR (HOSPITAL ORDER, PERFORMED IN ~~LOC~~ HOSPITAL LAB): SARS Coronavirus 2: NEGATIVE

## 2019-05-26 MED ORDER — FERRIC CITRATE 1 GM 210 MG(FE) PO TABS: 420.0000 mg | ORAL_TABLET | ORAL | Status: DC

## 2019-05-26 MED ORDER — FENTANYL CITRATE (PF) 100 MCG/2ML IJ SOLN
100.0000 ug | Freq: Once | INTRAMUSCULAR | Status: AC
  Administered 2019-05-26: 100 ug via INTRAVENOUS
  Filled 2019-05-26: qty 2

## 2019-05-26 MED ORDER — LOSARTAN POTASSIUM 50 MG PO TABS
100.0000 mg | ORAL_TABLET | Freq: Every day | ORAL | Status: DC
  Administered 2019-05-26 – 2019-05-30 (×5): 100 mg via ORAL
  Filled 2019-05-26 (×6): qty 2

## 2019-05-26 MED ORDER — SODIUM CHLORIDE 0.9 % IV SOLN: INTRAVENOUS | Status: DC

## 2019-05-26 MED ORDER — GUAIFENESIN-DM 100-10 MG/5ML PO SYRP: 15.0000 mL | ORAL_SOLUTION | ORAL | Status: DC | PRN

## 2019-05-26 MED ORDER — DOCUSATE SODIUM 100 MG PO CAPS
100.0000 mg | ORAL_CAPSULE | Freq: Two times a day (BID) | ORAL | Status: DC
  Administered 2019-05-26 – 2019-05-30 (×8): 100 mg via ORAL
  Filled 2019-05-26 (×9): qty 1

## 2019-05-26 MED ORDER — CLEVIDIPINE BUTYRATE 0.5 MG/ML IV EMUL: 0.0000 mg/h | INTRAVENOUS | Status: DC

## 2019-05-26 MED ORDER — ESMOLOL BOLUS VIA INFUSION
500.0000 ug/kg | Freq: Once | INTRAVENOUS | Status: AC
  Administered 2019-05-26: 46500 ug via INTRAVENOUS
  Filled 2019-05-26: qty 47000

## 2019-05-26 MED ORDER — ESMOLOL HCL-SODIUM CHLORIDE 2000 MG/100ML IV SOLN
25.0000 ug/kg/min | INTRAVENOUS | Status: DC
  Administered 2019-05-26: 225 ug/kg/min via INTRAVENOUS
  Administered 2019-05-26: 300 ug/kg/min via INTRAVENOUS
  Administered 2019-05-26: 71.685 ug/kg/min via INTRAVENOUS
  Administered 2019-05-26: 300 ug/kg/min via INTRAVENOUS
  Administered 2019-05-26: 250 ug/kg/min via INTRAVENOUS
  Administered 2019-05-26: 300 ug/kg/min via INTRAVENOUS
  Administered 2019-05-26: 12:00:00 25 ug/kg/min via INTRAVENOUS
  Administered 2019-05-26 (×2): 300 ug/kg/min via INTRAVENOUS
  Administered 2019-05-27: 250 ug/kg/min via INTRAVENOUS
  Administered 2019-05-27 (×2): 200 ug/kg/min via INTRAVENOUS
  Administered 2019-05-27: 250 ug/kg/min via INTRAVENOUS
  Administered 2019-05-27: 71.685 ug/kg/min via INTRAVENOUS
  Filled 2019-05-26 (×19): qty 100

## 2019-05-26 MED ORDER — ENSURE ENLIVE PO LIQD
237.0000 mL | Freq: Two times a day (BID) | ORAL | Status: DC
  Administered 2019-05-29: 13:00:00 237 mL via ORAL

## 2019-05-26 MED ORDER — AMLODIPINE BESYLATE 10 MG PO TABS
10.0000 mg | ORAL_TABLET | Freq: Every day | ORAL | Status: DC
  Administered 2019-05-26 – 2019-05-30 (×5): 10 mg via ORAL
  Filled 2019-05-26 (×5): qty 1

## 2019-05-26 MED ORDER — HEPARIN SODIUM (PORCINE) 1000 UNIT/ML IJ SOLN
INTRAMUSCULAR | Status: AC
  Administered 2019-05-26: 1000 [IU]
  Filled 2019-05-26: qty 3

## 2019-05-26 MED ORDER — HYDRALAZINE HCL 20 MG/ML IJ SOLN
5.0000 mg | INTRAMUSCULAR | Status: AC | PRN
  Administered 2019-05-26 (×2): 5 mg via INTRAVENOUS
  Filled 2019-05-26 (×2): qty 1

## 2019-05-26 MED ORDER — CALCITRIOL 0.25 MCG PO CAPS
1.2500 ug | ORAL_CAPSULE | ORAL | Status: DC
  Administered 2019-05-27 – 2019-05-29 (×2): 1.25 ug via ORAL
  Filled 2019-05-26: qty 5

## 2019-05-26 MED ORDER — METOPROLOL TARTRATE 5 MG/5ML IV SOLN: 2.0000 mg | INTRAVENOUS | Status: DC | PRN

## 2019-05-26 MED ORDER — ONDANSETRON HCL 4 MG/2ML IJ SOLN
4.0000 mg | Freq: Four times a day (QID) | INTRAMUSCULAR | Status: DC | PRN
  Administered 2019-05-26 – 2019-05-27 (×2): 4 mg via INTRAVENOUS
  Filled 2019-05-26 (×2): qty 2

## 2019-05-26 MED ORDER — PANTOPRAZOLE SODIUM 40 MG PO TBEC
40.0000 mg | DELAYED_RELEASE_TABLET | Freq: Every day | ORAL | Status: DC
  Administered 2019-05-26 – 2019-05-30 (×5): 40 mg via ORAL
  Filled 2019-05-26 (×6): qty 1

## 2019-05-26 MED ORDER — MORPHINE SULFATE (PF) 2 MG/ML IV SOLN
2.0000 mg | INTRAVENOUS | Status: DC | PRN
  Administered 2019-05-26 (×2): 2 mg via INTRAVENOUS
  Filled 2019-05-26 (×2): qty 1

## 2019-05-26 MED ORDER — FENTANYL CITRATE (PF) 100 MCG/2ML IJ SOLN
25.0000 ug | INTRAMUSCULAR | Status: DC | PRN
  Administered 2019-05-26 (×2): 50 ug via INTRAVENOUS
  Administered 2019-05-29: 100 ug via INTRAVENOUS
  Administered 2019-05-29: 50 ug via INTRAVENOUS
  Administered 2019-05-30 – 2019-05-31 (×3): 100 ug via INTRAVENOUS
  Filled 2019-05-26 (×7): qty 2

## 2019-05-26 MED ORDER — ASPIRIN EC 81 MG PO TBEC
81.0000 mg | DELAYED_RELEASE_TABLET | Freq: Every evening | ORAL | Status: DC
  Administered 2019-05-26 – 2019-05-30 (×4): 81 mg via ORAL
  Filled 2019-05-26 (×4): qty 1

## 2019-05-26 MED ORDER — HEPARIN SODIUM (PORCINE) 5000 UNIT/ML IJ SOLN
5000.0000 [IU] | Freq: Three times a day (TID) | INTRAMUSCULAR | Status: DC
  Administered 2019-05-26 – 2019-05-31 (×14): 5000 [IU] via SUBCUTANEOUS
  Filled 2019-05-26 (×13): qty 1

## 2019-05-26 MED ORDER — FERRIC CITRATE 1 GM 210 MG(FE) PO TABS
420.0000 mg | ORAL_TABLET | Freq: Two times a day (BID) | ORAL | Status: DC
  Administered 2019-05-28 – 2019-05-29 (×3): 420 mg via ORAL
  Filled 2019-05-26 (×9): qty 2

## 2019-05-26 MED ORDER — ACETAMINOPHEN 325 MG RE SUPP: 325.0000 mg | RECTAL | Status: DC | PRN

## 2019-05-26 MED ORDER — LABETALOL HCL 300 MG PO TABS
300.0000 mg | ORAL_TABLET | Freq: Three times a day (TID) | ORAL | Status: DC
  Administered 2019-05-26 – 2019-05-28 (×8): 300 mg via ORAL
  Filled 2019-05-26 (×2): qty 2
  Filled 2019-05-26: qty 1
  Filled 2019-05-26 (×5): qty 2

## 2019-05-26 MED ORDER — ACETAMINOPHEN 325 MG PO TABS
325.0000 mg | ORAL_TABLET | ORAL | Status: DC | PRN
  Administered 2019-05-27 – 2019-05-29 (×3): 650 mg via ORAL
  Filled 2019-05-26 (×4): qty 2

## 2019-05-26 MED ORDER — LABETALOL HCL 5 MG/ML IV SOLN
10.0000 mg | INTRAVENOUS | Status: AC | PRN
  Administered 2019-05-26 – 2019-05-28 (×4): 10 mg via INTRAVENOUS
  Filled 2019-05-26 (×4): qty 4

## 2019-05-26 MED ORDER — HYDRALAZINE HCL 50 MG PO TABS
100.0000 mg | ORAL_TABLET | Freq: Three times a day (TID) | ORAL | Status: DC
  Administered 2019-05-26 – 2019-05-30 (×14): 100 mg via ORAL
  Filled 2019-05-26 (×15): qty 2

## 2019-05-26 MED ORDER — LEVALBUTEROL HCL 0.63 MG/3ML IN NEBU: 0.6300 mg | INHALATION_SOLUTION | RESPIRATORY_TRACT | Status: DC | PRN

## 2019-05-26 MED ORDER — CEFAZOLIN SODIUM-DEXTROSE 2-4 GM/100ML-% IV SOLN
2.0000 g | INTRAVENOUS | Status: DC
  Administered 2019-05-29: 2 g via INTRAVENOUS
  Filled 2019-05-26 (×3): qty 100

## 2019-05-26 MED ORDER — PHENOL 1.4 % MT LIQD
1.0000 | OROMUCOSAL | Status: DC | PRN
  Filled 2019-05-26: qty 177

## 2019-05-26 MED ORDER — FERRIC CITRATE 1 GM 210 MG(FE) PO TABS
630.0000 mg | ORAL_TABLET | Freq: Three times a day (TID) | ORAL | Status: DC
  Administered 2019-05-27 – 2019-05-30 (×10): 630 mg via ORAL
  Filled 2019-05-26 (×17): qty 3

## 2019-05-26 MED ORDER — OXYCODONE HCL 5 MG PO TABS
5.0000 mg | ORAL_TABLET | ORAL | Status: DC | PRN
  Administered 2019-05-26 – 2019-05-29 (×6): 10 mg via ORAL
  Filled 2019-05-26 (×6): qty 2
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
## 2019-05-26 NOTE — Plan of Care (Signed)
  Problem: Clinical Measurements: Goal: Ability to maintain clinical measurements within normal limits will improve Outcome: Not Progressing Note: Pt hypertensive esmolol drip, home meds restarted, pending response to HD to determine if BP will normalize post HD treatment

## 2019-05-26 NOTE — Plan of Care (Signed)
  Problem: Education: Goal: Knowledge of General Education information will improve Description: Including pain rating scale, medication(s)/side effects and non-pharmacologic comfort measures Outcome: Progressing   Problem: Health Behavior/Discharge Planning: Goal: Ability to manage health-related needs will improve Outcome: Progressing   Problem: Clinical Measurements: Goal: Will remain free from infection Outcome: Progressing   Problem: Activity: Goal: Risk for activity intolerance will decrease Outcome: Progressing   Problem: Nutrition: Goal: Adequate nutrition will be maintained Outcome: Progressing   Problem: Pain Managment: Goal: General experience of comfort will improve Outcome: Progressing   Problem: Pain Managment: Goal: General experience of comfort will improve Outcome: Progressing
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
## 2019-05-26 NOTE — Procedures (Signed)
Arterial Catheter Insertion Procedure Note Frank Fuller [id number] [birth year]  Procedure: Insertion of Arterial Catheter  Indications: Blood pressure monitoring  Procedure Details Consent: Risks of procedure as well as the alternatives and risks of each were explained to the (patient/caregiver).  Consent for procedure obtained. Time Out: Verified patient identification, verified procedure, site/side was marked, verified correct patient position, special equipment/implants available, medications/allergies/relevent history reviewed, required imaging and test results available.  Performed  Maximum sterile technique was used including antiseptics, cap, gloves, gown, hand hygiene, mask and sheet. Skin prep: Chlorhexidine; local anesthetic administered 20 gauge catheter was inserted into left radial artery using the Seldinger technique. ULTRASOUND GUIDANCE USED: NO Evaluation Blood flow good; BP tracing good. Complications: No apparent complications.   Donnetta Hail 05/26/2019
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
## 2019-05-26 NOTE — ED Triage Notes (Signed)
Pt arrives via [facility] from [facility]. Pt complains of CP through to back since last night. Pt also states he has tearing pain in abd through to back. Pt has known AAA size 3.6 cm, today his CT showed an increase in size at 4.1cm. Pt transferred here for further treatment. [facility] states pts family is aware. Pt received dilaudid and fentanyl from [facility] and was started on nitroprusside drip. Pt remains on drip at 1.2 mcg/kg/min. EMS also gave 71mcg fentanyl. bp 204/160. 97% on 2L [place]. Pt receives dialysis in right upper chest port TTS. Last received this past Sat. Pt also has nitro paste on left upper chest. Pt is alert and oriented.
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
## 2019-05-27 DIAGNOSIS — G9340 Encephalopathy, unspecified: Secondary | ICD-10-CM

## 2019-05-27 DIAGNOSIS — I7101 Dissection of thoracic aorta: Principal | ICD-10-CM

## 2019-05-27 DIAGNOSIS — N186 End stage renal disease: Secondary | ICD-10-CM

## 2019-05-27 LAB — COMPREHENSIVE METABOLIC PANEL
ALT: <5 U/L (ref 0–44)
AST: 12 U/L — ABNORMAL LOW (ref 15–41)
Albumin: 3.3 g/dL — ABNORMAL LOW (ref 3.5–5.0)
Alkaline Phosphatase: 55 U/L (ref 38–126)
Anion gap: 15 (ref 5–15)
BUN: 39 mg/dL — ABNORMAL HIGH (ref 6–20)
CO2: 23 mmol/L (ref 22–32)
Calcium: 9.1 mg/dL (ref 8.9–10.3)
Chloride: 98 mmol/L (ref 98–111)
Creatinine, Ser: 9.8 mg/dL — ABNORMAL HIGH (ref 0.61–1.24)
GFR calc Af Amer: 6 mL/min — ABNORMAL LOW (ref >60)
GFR calc non Af Amer: 5 mL/min — ABNORMAL LOW (ref >60)
Glucose, Bld: 99 mg/dL (ref 70–99)
Potassium: 5.5 mmol/L — ABNORMAL HIGH (ref 3.5–5.1)
Sodium: 136 mmol/L (ref 135–145)
Total Bilirubin: 0.5 mg/dL (ref 0.3–1.2)
Total Protein: 7 g/dL (ref 6.5–8.1)

## 2019-05-27 LAB — CBC
HCT: 32.3 % — ABNORMAL LOW (ref 39.0–52.0)
Hemoglobin: 10.5 g/dL — ABNORMAL LOW (ref 13.0–17.0)
MCH: 31.3 pg (ref 26.0–34.0)
MCHC: 32.5 g/dL (ref 30.0–36.0)
MCV: 96.4 fL (ref 80.0–100.0)
Platelets: 173 10*3/uL (ref 150–400)
RBC: 3.35 MIL/uL — ABNORMAL LOW (ref 4.22–5.81)
RDW: 16.9 % — ABNORMAL HIGH (ref 11.5–15.5)
WBC: 5.9 10*3/uL (ref 4.0–10.5)
nRBC: 0 % (ref 0.0–0.2)

## 2019-05-27 MED ORDER — HEPARIN SODIUM (PORCINE) 1000 UNIT/ML IJ SOLN
INTRAMUSCULAR | Status: AC
  Administered 2019-05-27: 1000 [IU]
  Filled 2019-05-27: qty 4

## 2019-05-27 NOTE — Progress Notes (Signed)
[facility] Progress Note  Subjective: BP's controlled on esmo gtt, CP resolving, no HD now. Had HD last night, -2.5 L removed.   Vitals:   05/27/19 0930 05/27/19 0945 05/27/19 1000 05/27/19 1015  BP: 118/75 125/85 136/85 (!) 149/65  Pulse: 76 76 86 77  Resp: (!) 21 14 (!) 21 13  Temp:      TempSrc:      SpO2: 98% 99% 95% 99%  Weight:      Height:        Inpatient medications: . amLODipine  10 mg Oral QHS  . aspirin EC  81 mg Oral QPM  . calcitRIOL  1.25 mcg Oral Q T,Th,Sa-HD  . docusate sodium  100 mg Oral BID  . feeding supplement (ENSURE ENLIVE)  237 mL Oral BID BM  . ferric citrate  420 mg Oral BID BM  . ferric citrate  630 mg Oral TID WC  . heparin      . heparin  5,000 Units Subcutaneous Q8H  . hydrALAZINE  100 mg Oral TID  . labetalol  300 mg Oral TID  . losartan  100 mg Oral Daily  . pantoprazole  40 mg Oral Daily   . sodium chloride    . ceFAZolin    . esmolol Stopped (05/27/19 0932)   acetaminophen **OR** acetaminophen, fentaNYL (SUBLIMAZE) injection, guaiFENesin-dextromethorphan, labetalol, levalbuterol, metoprolol tartrate, ondansetron, oxyCODONE, phenol    Exam:  alert, no distress  no jvd  Chest cta bilat   Cor reg no rg   Abd soft no ascites   Ext no edema       Dialysis: [facility]  4h 68min  400/1.5   87kg  2/2.25 bath  P2  Hep 3000  Mircera 86mcg IV q 2 weeks  Parsabiv 2.31mcg IV TIW with HD Calcitriol 1.25 mcg PO Q HD  Assessment/ Plan: 1. Type B aortic dissection/ hypertensive emergency: per VVS no current indication for operative intervention. BP's under better control (goal SBP < 120 if mentation ok, goal HR 60 bpm) 2.  ESRD:  usual HD TTS. Had extra HD last night 2.5 L off, on HD now goal another 2.5 L. Lower edw if will tolerate.  3.  Hypertension/volume: getting all his home BP meds here - norvasc 10 - hydralazine 100 tid - labetalol 300 tid - losartan 100 qd  Continue to lower volume as tolerated. May need  additional BP medication of BP cannot be controlled on current regiment (minoxidil e.g.).  4.  Anemia: Hemoglobin 11.0, on mircera as an outpatient. Not due for ESA yet.   5.  Metabolic bone disease: Calcium 9.4. Phos pending. Will continue calcitriol. Parsabiv not available inpatient- follow calcium. Continue auryxia. 6.  Nutrition:  Renal diet with fluid restrictions recommended once no longer NPO.  7. Recent MSSA bacteremia/ AVG infection: had infection and had revision in May, then partial resection/ revision in 6/20 then all AVG material (x2) removed 6/30. 6 wks abx IV Ancef w/ HD were prescribed.       [facility] 05/27/2019, 10:44 AM  Iron/TIBC/Ferritin/ %Sat    Component Value Date/Time   IRON 38 (L) 04/22/2019 1130   TIBC 211 (L) 04/22/2019 1130   FERRITIN 305 08/03/2017 1028   IRONPCTSAT 18 04/22/2019 1130   Recent Labs  Lab 05/27/19 0538  NA 136  K 5.5*  CL 98  CO2 23  GLUCOSE 99  BUN 39*  CREATININE 9.80*  CALCIUM 9.1  ALBUMIN 3.3*  Recent Labs  Lab 05/27/19 0538  AST 12*  ALT <5  ALKPHOS 55  BILITOT 0.5  PROT 7.0   Recent Labs  Lab 05/27/19 0538  WBC 5.9  HGB 10.5*  HCT 32.3*  PLT 173

## 2019-05-27 NOTE — Progress Notes (Signed)
[facility]  Subjective  - no more chest pain   Objective (!) 149/65 77 98.1 F (36.7 C) (Oral) 13 99%  Intake/Output Summary (Last 24 hours) at 05/27/2019 1035 Last data filed at 05/27/2019 1000 Gross per 24 hour  Intake 1394.75 ml  Output 2500 ml  Net -1105.25 ml   BPs improve mostly 120s  Assessment/Planning: Descending Type B dissection chest pain resolved BP improving Appreciate renal CCM help with BP Still on Esmolol, transfer out of ICU when off IV BP meds and HR BP better controlled  Goal BP less than 120 HR 60 ish  Frank Fuller 05/27/2019 10:35 AM --  Laboratory Lab Results: Recent Labs    05/26/19 1144 05/27/19 0538  WBC 8.2 5.9  HGB 11.0* 10.5*  HCT 34.2* 32.3*  PLT 169 173   BMET Recent Labs    05/26/19 1144 05/27/19 0538  NA 137 136  K 5.9* 5.5*  CL 99 98  CO2 22 23  GLUCOSE 107* 99  BUN 66* 39*  CREATININE 13.33* 9.80*  CALCIUM 9.4 9.1    COAG Lab Results  Component Value Date   INR 1.2 04/23/2019   INR 1.2 04/22/2019   INR 1.2 04/14/2019   No results found for: PTT
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
## 2019-05-27 NOTE — Progress Notes (Signed)
Dr. Jonnie Finner saw pt. At bedside orders to increase goal to 3L as tolerated as long as pt. Is mentating. Keep SBP>110. Pt. Aware of order change

## 2019-05-28 ENCOUNTER — Encounter (HOSPITAL_COMMUNITY): Payer: Self-pay | Admitting: Physician Assistant

## 2019-05-28 DIAGNOSIS — I160 Hypertensive urgency: Secondary | ICD-10-CM

## 2019-05-28 LAB — T4, FREE: Free T4: 1.04 ng/dL (ref 0.61–1.12)

## 2019-05-28 LAB — BASIC METABOLIC PANEL
Anion gap: 17 — ABNORMAL HIGH (ref 5–15)
BUN: 28 mg/dL — ABNORMAL HIGH (ref 6–20)
CO2: 24 mmol/L (ref 22–32)
Calcium: 9.3 mg/dL (ref 8.9–10.3)
Chloride: 92 mmol/L — ABNORMAL LOW (ref 98–111)
Creatinine, Ser: 7.49 mg/dL — ABNORMAL HIGH (ref 0.61–1.24)
GFR calc Af Amer: 8 mL/min — ABNORMAL LOW (ref >60)
GFR calc non Af Amer: 7 mL/min — ABNORMAL LOW (ref >60)
Glucose, Bld: 102 mg/dL — ABNORMAL HIGH (ref 70–99)
Potassium: 4.3 mmol/L (ref 3.5–5.1)
Sodium: 133 mmol/L — ABNORMAL LOW (ref 135–145)

## 2019-05-28 LAB — TSH: TSH: 2.442 u[IU]/mL (ref 0.350–4.500)

## 2019-05-28 MED ORDER — CHLORHEXIDINE GLUCONATE CLOTH 2 % EX PADS: 6.0000 | MEDICATED_PAD | Freq: Every day | CUTANEOUS | Status: DC

## 2019-05-28 MED ORDER — CLONIDINE HCL 0.1 MG/24HR TD PTWK
0.1000 mg | MEDICATED_PATCH | TRANSDERMAL | Status: DC
  Administered 2019-05-28: 17:00:00 0.1 mg via TRANSDERMAL
  Filled 2019-05-28: qty 1

## 2019-05-28 NOTE — Progress Notes (Signed)
OT Cancellation Note  Patient Details Name: Frank Fuller MRN: [id number] DOB: 1961-08-29   Cancelled Treatment:    Reason Eval/Treat Not Completed: OT screened, no needs identified, will sign off. RN reports patient independent, spoke to PT who reports he as at his baseline independent level with ADLs/moblity.  OT will sign off.  If further needs arise, please re-consult.   Delight Stare, OT Acute Rehabilitation Services Pager [phone] Office [phone]   Delight Stare 05/28/2019, 8:45 AM

## 2019-05-28 NOTE — Progress Notes (Addendum)
[facility] Progress Note  Subjective: BP's back up off IV esmolol gtt today.  No CP or SOB.  HD x 2, 2.5 and 3.0 L off, now 2kg under dry wt. No c/o today , up in chair and looks good.   Vitals:   05/28/19 0830 05/28/19 0956 05/28/19 1153 05/28/19 1200  BP: (!) 130/91 (!) 163/95  (!) 153/105  Pulse:      Resp:      Temp:   98.4 F (36.9 C)   TempSrc:   Oral   SpO2:      Weight:      Height:        Inpatient medications: . amLODipine  10 mg Oral QHS  . aspirin EC  81 mg Oral QPM  . calcitRIOL  1.25 mcg Oral Q T,Th,Sa-HD  . docusate sodium  100 mg Oral BID  . feeding supplement (ENSURE ENLIVE)  237 mL Oral BID BM  . ferric citrate  420 mg Oral BID BM  . ferric citrate  630 mg Oral TID WC  . heparin  5,000 Units Subcutaneous Q8H  . hydrALAZINE  100 mg Oral TID  . labetalol  300 mg Oral TID  . losartan  100 mg Oral Daily  . pantoprazole  40 mg Oral Daily   . sodium chloride    . ceFAZolin     acetaminophen **OR** acetaminophen, fentaNYL (SUBLIMAZE) injection, guaiFENesin-dextromethorphan, labetalol, levalbuterol, metoprolol tartrate, ondansetron, oxyCODONE, phenol    Exam:  alert, no distress  no jvd  Chest cta bilat   Cor reg no rg   Abd soft no ascites   Ext no edema   RIJ TDC   Dialysis: [facility]  4h 31min  400/1.5   87kg  2/2.25 bath  P2  RIJ TDC  Hep 3000  Mircera 16mcg IV q 2 weeks  Parsabiv 2.34mcg IV TIW with HD Calcitriol 1.25 mcg PO Q HD  Assessment/ Plan: 1. Type B aortic dissection/ hypertensive emergency: per VVS no current indication for operative intervention. BP controlled w/ IV esmolol initially, now is off and BP's rising again.  2.  ESRD:  usual HD TTS. Had HD Mon and Tues. HD tomorrow. Using [facility] now after recent AVG resection(s) for infection.  3.  Hypertension/volume: getting all his home BP meds here - norvasc 10 - hydralazine 100 tid - labetalol 300 tid - losartan 100 qd - will ask cardiology for assistance w/ long-term  medical management of BP and HR - 1.5 kg under prior dry wt now, will cont to lower edw gradually as tolerated.  4.  Anemia: Hemoglobin 11.0, on mircera as an outpatient. Not due for ESA yet.   5.  Metabolic bone disease: Calcium 9.4. Phos pending. Will continue calcitriol. Parsabiv not available inpatient- follow calcium. Continue auryxia. 6.  Nutrition:  Renal diet with fluid restrictions recommended once no longer NPO.  7. Recent MSSA bacteremia/ AVG infection: had infection in AVG and had revision in 5/20, then partial resection/ revision in 6/20, then all AVG material (x2) removed 6/30. 6 wks abx IV Ancef w/ HD were prescribed.       [facility] 05/28/2019, 12:44 PM  Iron/TIBC/Ferritin/ %Sat    Component Value Date/Time   IRON 38 (L) 04/22/2019 1130   TIBC 211 (L) 04/22/2019 1130   FERRITIN 305 08/03/2017 1028   IRONPCTSAT 18 04/22/2019 1130   Recent Labs  Lab 05/27/19 0538 05/28/19 0236  NA 136 133*  K 5.5* 4.3  CL 98 92*  CO2 23 24  GLUCOSE 99 102*  BUN 39* 28*  CREATININE 9.80* 7.49*  CALCIUM 9.1 9.3  ALBUMIN 3.3*  --    Recent Labs  Lab 05/27/19 0538  AST 12*  ALT <5  ALKPHOS 55  BILITOT 0.5  PROT 7.0   Recent Labs  Lab 05/27/19 0538  WBC 5.9  HGB 10.5*  HCT 32.3*  PLT 173
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
## 2019-05-28 NOTE — Progress Notes (Signed)
PT Cancellation Note  Patient Details Name: Frank Fuller MRN: [id number] DOB: [birth year]   Cancelled Treatment:    Reason Eval/Treat Not Completed: PT screened, no needs identified, will sign off : PT screened, no needs identified, will sign off. RN reports patient independent, spoke to OT who reports he as at his baseline independent level with ADLs/moblity.  PT will sign off.  If further needs arise, please re-consult.   [name], PT, DPT Acute Rehabilitation Services Pager: [phone] Office: [phone]     [name] 05/28/2019, 9:34 AM

## 2019-05-28 NOTE — Plan of Care (Signed)
  Problem: Education: ?Goal: Knowledge of General Education information will improve ?Description: Including pain rating scale, medication(s)/side effects and non-pharmacologic comfort measures ?Outcome: Progressing ?  ?Problem: Health Behavior/Discharge Planning: ?Goal: Ability to manage health-related needs will improve ?Outcome: Progressing ?  ?Problem: Clinical Measurements: ?Goal: Ability to maintain clinical measurements within normal limits will improve ?Outcome: Progressing ?Goal: Will remain free from infection ?Outcome: Progressing ?Goal: Diagnostic test results will improve ?Outcome: Progressing ?Goal: Cardiovascular complication will be avoided ?Outcome: Progressing ?  ?Problem: Activity: ?Goal: Risk for activity intolerance will decrease ?Outcome: Progressing ?  ?Problem: Nutrition: ?Goal: Adequate nutrition will be maintained ?Outcome: Progressing ?  ?Problem: Coping: ?Goal: Level of anxiety will decrease ?Outcome: Progressing ?  ?Problem: Elimination: ?Goal: Will not experience complications related to bowel motility ?Outcome: Progressing ?  ?Problem: Pain Managment: ?Goal: General experience of comfort will improve ?Outcome: Progressing ?  ?Problem: Safety: ?Goal: Ability to remain free from injury will improve ?Outcome: Progressing ?  ?Problem: Skin Integrity: ?Goal: Risk for impaired skin integrity will decrease ?Outcome: Progressing ?  ?

## 2019-05-28 NOTE — Progress Notes (Addendum)
Patient arrived on the unit from [facility] on a wheelchair, placed on tele ccmd notified, assessment  completed see flow sheet, patient allergic to CHG, no CHG bath given, patient oriented to room and staff, bed in lowest position call bell within reach will continue to monitor.

## 2019-05-28 NOTE — Progress Notes (Signed)
[facility]  Subjective  - no chest pain   Objective (!) 156/98 79 98.3 F (36.8 C) (Oral) 14 97%  Intake/Output Summary (Last 24 hours) at 05/28/2019 0840 Last data filed at 05/27/2019 2000 Gross per 24 hour  Intake 519.65 ml  Output 3000 ml  Net -2480.35 ml    Assessment/Planning: Type B aortic dissection now asymptomatic BP control is improved but still having spikes into the 150s during the day Renal service continues to make med adjustments. Will probably need a few more days to meet goal of SBP 120 and HR 60 Pt has existing transfer orders out of ICU but no bed Will repeat CTA chest abdomen pelvis prior to d/c home.   Ruta Hinds 05/28/2019 8:40 AM --  Laboratory Lab Results: Recent Labs    05/26/19 1144 05/27/19 0538  WBC 8.2 5.9  HGB 11.0* 10.5*  HCT 34.2* 32.3*  PLT 169 173   BMET Recent Labs    05/27/19 0538 05/28/19 0236  NA 136 133*  K 5.5* 4.3  CL 98 92*  CO2 23 24  GLUCOSE 99 102*  BUN 39* 28*  CREATININE 9.80* 7.49*  CALCIUM 9.1 9.3    COAG Lab Results  Component Value Date   INR 1.2 04/23/2019   INR 1.2 04/22/2019   INR 1.2 04/14/2019   No results found for: PTT

## 2019-05-29 DIAGNOSIS — I10 Essential (primary) hypertension: Secondary | ICD-10-CM

## 2019-05-29 LAB — BASIC METABOLIC PANEL
Anion gap: 18 — ABNORMAL HIGH (ref 5–15)
BUN: 45 mg/dL — ABNORMAL HIGH (ref 6–20)
CO2: 22 mmol/L (ref 22–32)
Calcium: 9.2 mg/dL (ref 8.9–10.3)
Chloride: 90 mmol/L — ABNORMAL LOW (ref 98–111)
Creatinine, Ser: 10.88 mg/dL — ABNORMAL HIGH (ref 0.61–1.24)
GFR calc Af Amer: 5 mL/min — ABNORMAL LOW (ref >60)
GFR calc non Af Amer: 5 mL/min — ABNORMAL LOW (ref >60)
Glucose, Bld: 96 mg/dL (ref 70–99)
Potassium: 4.3 mmol/L (ref 3.5–5.1)
Sodium: 130 mmol/L — ABNORMAL LOW (ref 135–145)

## 2019-05-29 LAB — LIPID PANEL
Cholesterol: 189 mg/dL (ref 0–200)
HDL: 55 mg/dL (ref >40)
LDL Cholesterol: 101 mg/dL — ABNORMAL HIGH (ref 0–99)
Total CHOL/HDL Ratio: 3.4 RATIO
Triglycerides: 163 mg/dL — ABNORMAL HIGH (ref <150)
VLDL: 33 mg/dL (ref 0–40)

## 2019-05-29 LAB — T3: T3, Total: 70 ng/dL — ABNORMAL LOW (ref 71–180)

## 2019-05-29 MED ORDER — MINOXIDIL 2.5 MG PO TABS
2.5000 mg | ORAL_TABLET | Freq: Two times a day (BID) | ORAL | Status: DC
  Administered 2019-05-29 – 2019-05-30 (×4): 2.5 mg via ORAL
  Filled 2019-05-29 (×6): qty 1

## 2019-05-29 MED ORDER — HEPARIN SODIUM (PORCINE) 1000 UNIT/ML IJ SOLN
INTRAMUSCULAR | Status: AC
  Administered 2019-05-29: 4000 [IU]
  Filled 2019-05-29: qty 4

## 2019-05-29 MED ORDER — CALCITRIOL 0.5 MCG PO CAPS
ORAL_CAPSULE | ORAL | Status: AC
  Filled 2019-05-29: qty 2

## 2019-05-29 MED ORDER — LABETALOL HCL 5 MG/ML IV SOLN
INTRAVENOUS | Status: AC
  Administered 2019-05-29: 05:00:00 10 mg via INTRAVENOUS
  Filled 2019-05-29: qty 4

## 2019-05-29 MED ORDER — CALCITRIOL 0.25 MCG PO CAPS
ORAL_CAPSULE | ORAL | Status: AC
  Filled 2019-05-29: qty 1

## 2019-05-29 MED ORDER — LABETALOL HCL 300 MG PO TABS
300.0000 mg | ORAL_TABLET | Freq: Three times a day (TID) | ORAL | Status: DC
  Administered 2019-05-29 – 2019-05-30 (×5): 300 mg via ORAL
  Filled 2019-05-29 (×6): qty 1

## 2019-05-29 MED ORDER — LABETALOL HCL 5 MG/ML IV SOLN
10.0000 mg | INTRAVENOUS | Status: DC | PRN
  Administered 2019-05-29 – 2019-05-31 (×3): 10 mg via INTRAVENOUS
  Filled 2019-05-29 (×2): qty 4

## 2019-05-29 MED ORDER — CLONIDINE HCL 0.2 MG/24HR TD PTWK: 0.2000 mg | MEDICATED_PATCH | TRANSDERMAL | Status: DC

## 2019-05-29 MED ORDER — LABETALOL HCL 200 MG PO TABS: 400.0000 mg | ORAL_TABLET | Freq: Three times a day (TID) | ORAL | Status: DC

## 2019-05-29 MED ORDER — CLONIDINE HCL 0.1 MG/24HR TD PTWK: 0.1000 mg | MEDICATED_PATCH | TRANSDERMAL | Status: DC

## 2019-05-29 NOTE — Progress Notes (Signed)
Patient ID: Frank Fuller, male   DOB: May 30, 1961, 58 y.o.   MRN: [id number]    Subjective:  Denies SSCP, palpitations or Dyspnea For dialysis this am   Objective:  Vitals:   05/29/19 0740 05/29/19 0742 05/29/19 0800 05/29/19 0830  BP: (!) 164/103 (!) 154/108 (!) 139/116 138/82  Pulse: 76  75 78  Resp:    15  Temp:      TempSrc:      SpO2:      Weight:      Height:        Intake/Output from previous day:  Intake/Output Summary (Last 24 hours) at 05/29/2019 0845 Last data filed at 05/28/2019 1923 Gross per 24 hour  Intake 810 ml  Output -  Net 810 ml    Physical Exam:  Black male Old fistula RUE Dialysis catheter RUE Good left radial pulse  No edema No renal bruit  Lungs clear No murmur   Lab Results: Basic Metabolic Panel: Recent Labs    05/28/19 0236 05/29/19 0253  NA 133* 130*  K 4.3 4.3  CL 92* 90*  CO2 24 22  GLUCOSE 102* 96  BUN 28* 45*  CREATININE 7.49* 10.88*  CALCIUM 9.3 9.2   Liver Function Tests: Recent Labs    05/27/19 0538  AST 12*  ALT <5  ALKPHOS 55  BILITOT 0.5  PROT 7.0  ALBUMIN 3.3*   No results for input(s): LIPASE, AMYLASE in the last 72 hours. CBC: Recent Labs    05/26/19 1144 05/27/19 0538  WBC 8.2 5.9  NEUTROABS 6.3  --   HGB 11.0* 10.5*  HCT 34.2* 32.3*  MCV 98.0 96.4  PLT 169 173   Fasting Lipid Panel: Recent Labs    05/29/19 0256  CHOL 189  HDL 55  LDLCALC 101*  TRIG 163*  CHOLHDL 3.4   Thyroid Function Tests: Recent Labs    05/28/19 1536  TSH 2.442    Imaging: No results found.  Cardiac Studies:  ECG:  Orders placed or performed during the hospital encounter of 05/26/19  . EKG 12-Lead  . EKG 12-Lead  . ED EKG  . ED EKG  . 12 lead EKG  . 12 lead EKG  . EKG 12-Lead  . EKG 12-Lead  . EKG 12-Lead  . EKG 12-Lead  . EKG     Telemetry:  Echo:   Medications:   . amLODipine  10 mg Oral QHS  . aspirin EC  81 mg Oral QPM  . calcitRIOL  1.25 mcg Oral Q T,Th,Sa-HD  . cloNIDine  0.1 mg  Transdermal Weekly  . docusate sodium  100 mg Oral BID  . feeding supplement (ENSURE ENLIVE)  237 mL Oral BID BM  . ferric citrate  420 mg Oral BID BM  . ferric citrate  630 mg Oral TID WC  . heparin  5,000 Units Subcutaneous Q8H  . hydrALAZINE  100 mg Oral TID  . labetalol  300 mg Oral TID  . losartan  100 mg Oral Daily  . pantoprazole  40 mg Oral Daily     . sodium chloride    . ceFAZolin      Assessment/Plan:   HTN:  Improved with adding clonidine patch Volume status dialysis dependant so no diuretic  Having repeat CT scan today per Dr Oneida Alar Can arrange outpatient f/u with Cardiologist in [place] where he lives   Jenkins Rouge 05/29/2019, 8:45 AM

## 2019-05-29 NOTE — Plan of Care (Signed)
  Problem: Education: Goal: Knowledge of General Education information will improve Description Including pain rating scale, medication(s)/side effects and non-pharmacologic comfort measures Outcome: Progressing   Problem: Health Behavior/Discharge Planning: Goal: Ability to manage health-related needs will improve Outcome: Progressing   

## 2019-05-29 NOTE — Progress Notes (Addendum)
[facility] Progress Note  Subjective: BP 120- 160 / 80- 95  Vitals:   05/29/19 1018 05/29/19 1030 05/29/19 1100 05/29/19 1130  BP: 118/72 120/82 (!) 143/91   Pulse: 72 75 73 70  Resp:   15   Temp:    98.2 F (36.8 C)  TempSrc:    Oral  SpO2:      Weight:      Height:        Inpatient medications: . amLODipine  10 mg Oral QHS  . aspirin EC  81 mg Oral QPM  . calcitRIOL      . calcitRIOL      . calcitRIOL  1.25 mcg Oral Q T,Th,Sa-HD  . cloNIDine  0.1 mg Transdermal Weekly  . docusate sodium  100 mg Oral BID  . feeding supplement (ENSURE ENLIVE)  237 mL Oral BID BM  . ferric citrate  420 mg Oral BID BM  . ferric citrate  630 mg Oral TID WC  . heparin  5,000 Units Subcutaneous Q8H  . hydrALAZINE  100 mg Oral TID  . labetalol  300 mg Oral TID  . losartan  100 mg Oral Daily  . pantoprazole  40 mg Oral Daily   . sodium chloride    . ceFAZolin 2 g (05/29/19 1055)   acetaminophen **OR** acetaminophen, fentaNYL (SUBLIMAZE) injection, guaiFENesin-dextromethorphan, labetalol, levalbuterol, metoprolol tartrate, ondansetron, oxyCODONE, phenol    Exam:  alert, no distress  no jvd  Chest cta bilat   Cor reg no rg   Abd soft no ascites   Ext no edema   RIJ TDC   Dialysis: [facility]  4h 48min  400/1.5   87kg  2/2.25 bath  P2  RIJ TDC  Hep 3000  Mircera 64mcg IV q 2 weeks  Parsabiv 2.61mcg IV TIW with HD Calcitriol 1.25 mcg PO Q HD  Assessment/ Plan: 1. Type B aortic dissection/ hypertensive emergency: per VVS no current indication for operative intervention. BP controlled w/ IV esmolol initially, now is off and titrating po/ topical BP meds 2.  ESRD:  usual HD TTS. Had HD Mon and Tues. HD today then Sat.  Using [facility] now after recent AVG resection for infection.  3.  Hypertension/volume: getting all his home BP meds here - norvasc 10 - hydralazine 100 tid - labetalol 300 tid - losartan 100 qd - clonidine patch 0.1 , placed yest per cardiology - will add  minoxidil 2.5 bid and titrate  - no extra volume to get after HD today, dry wt will be somewhat lowered ~2kg 4.  Anemia: Hemoglobin 11.0, on mircera as an outpatient. Not due for ESA yet.   5.  Metabolic bone disease: Calcium 9.4. Phos pending. Will continue calcitriol. Parsabiv not available inpatient- follow calcium. Continue auryxia. 6.  Nutrition:  Renal diet with fluid restrictions recommended once no longer NPO.  7. Recent MSSA bacteremia/ AVG infection: had infection in AVG and had revision in 5/20, then partial resection/ revision in 6/20, then all AVG material (x2) removed 6/30. 6 wks abx IV Ancef w/ HD were prescribed.       [facility] 05/29/2019, 11:57 AM  Iron/TIBC/Ferritin/ %Sat    Component Value Date/Time   IRON 38 (L) 04/22/2019 1130   TIBC 211 (L) 04/22/2019 1130   FERRITIN 305 08/03/2017 1028   IRONPCTSAT 18 04/22/2019 1130   Recent Labs  Lab 05/27/19 0538  05/29/19 0253  NA 136   < > 130*  K 5.5*   < >  4.3  CL 98   < > 90*  CO2 23   < > 22  GLUCOSE 99   < > 96  BUN 39*   < > 45*  CREATININE 9.80*   < > 10.88*  CALCIUM 9.1   < > 9.2  ALBUMIN 3.3*  --   --    < > = values in this interval not displayed.   Recent Labs  Lab 05/27/19 0538  AST 12*  ALT <5  ALKPHOS 55  BILITOT 0.5  PROT 7.0   Recent Labs  Lab 05/27/19 0538  WBC 5.9  HGB 10.5*  HCT 32.3*  PLT 173

## 2019-05-29 NOTE — Progress Notes (Signed)
[facility]  Subjective  - no chest pain feels ok   Objective (!) 139/116 75 98.3 F (36.8 C) (Oral) 18 96%  Intake/Output Summary (Last 24 hours) at 05/29/2019 [id number] Last data filed at 05/28/2019 1923 Gross per 24 hour  Intake 810 ml  Output -  Net 810 ml     Assessment/Planning: Type B aortic dissection Still with marginal BP control BP 150-160  Clonidine added yesterday He needs better control prior to d/c home or will be at very high risk for complications from his dissection  Will repeat CT in am  Ruta Hinds 05/29/2019 8:29 AM --  Laboratory Lab Results: Recent Labs    05/26/19 1144 05/27/19 0538  WBC 8.2 5.9  HGB 11.0* 10.5*  HCT 34.2* 32.3*  PLT 169 173   BMET Recent Labs    05/28/19 0236 05/29/19 0253  NA 133* 130*  K 4.3 4.3  CL 92* 90*  CO2 24 22  GLUCOSE 102* 96  BUN 28* 45*  CREATININE 7.49* 10.88*  CALCIUM 9.3 9.2    COAG Lab Results  Component Value Date   INR 1.2 04/23/2019   INR 1.2 04/22/2019   INR 1.2 04/14/2019   No results found for: PTT

## 2019-05-30 ENCOUNTER — Inpatient Hospital Stay (HOSPITAL_COMMUNITY): Payer: Medicare Other

## 2019-05-30 LAB — BASIC METABOLIC PANEL
Anion gap: 16 — ABNORMAL HIGH (ref 5–15)
BUN: 34 mg/dL — ABNORMAL HIGH (ref 6–20)
CO2: 23 mmol/L (ref 22–32)
Calcium: 9.7 mg/dL (ref 8.9–10.3)
Chloride: 91 mmol/L — ABNORMAL LOW (ref 98–111)
Creatinine, Ser: 8.86 mg/dL — ABNORMAL HIGH (ref 0.61–1.24)
GFR calc Af Amer: 7 mL/min — ABNORMAL LOW (ref >60)
GFR calc non Af Amer: 6 mL/min — ABNORMAL LOW (ref >60)
Glucose, Bld: 105 mg/dL — ABNORMAL HIGH (ref 70–99)
Potassium: 4.5 mmol/L (ref 3.5–5.1)
Sodium: 130 mmol/L — ABNORMAL LOW (ref 135–145)

## 2019-05-30 MED ORDER — IOHEXOL 350 MG/ML SOLN
100.0000 mL | Freq: Once | INTRAVENOUS | Status: AC | PRN
  Administered 2019-05-30: 100 mL via INTRAVENOUS

## 2019-05-30 MED ORDER — CLONIDINE HCL 0.2 MG/24HR TD PTWK
0.2000 mg | MEDICATED_PATCH | TRANSDERMAL | Status: DC
  Administered 2019-05-30: 0.2 mg via TRANSDERMAL
  Filled 2019-05-30: qty 1

## 2019-05-30 MED ORDER — CHLORHEXIDINE GLUCONATE CLOTH 2 % EX PADS: 6.0000 | MEDICATED_PAD | Freq: Every day | CUTANEOUS | Status: DC

## 2019-05-30 MED ORDER — FERRIC CITRATE 1 GM 210 MG(FE) PO TABS
420.0000 mg | ORAL_TABLET | Freq: Two times a day (BID) | ORAL | Status: DC | PRN
  Filled 2019-05-30: qty 2

## 2019-05-30 NOTE — Progress Notes (Signed)
Patient ID: Emer Kan, male   DOB: 03-24-1961, 58 y.o.   MRN: [id number]    Subjective:  No cardiac symptoms  CT f/u today   Objective:  Vitals:   05/29/19 2123 05/30/19 0012 05/30/19 0425 05/30/19 0810  BP: (!) 166/104 (!) 151/96 (!) 159/107 (!) 170/98  Pulse: 79 86 84 82  Resp: (!) 32 18 17 (!) 22  Temp:  98.6 F (37 C) 98.7 F (37.1 C) 98.5 F (36.9 C)  TempSrc:  Oral Oral Oral  SpO2: 97% 97% 96%   Weight:   87.7 kg   Height:        Intake/Output from previous day:  Intake/Output Summary (Last 24 hours) at 05/30/2019 [id number] Last data filed at 05/29/2019 1130 Gross per 24 hour  Intake -  Output 2300 ml  Net -2300 ml    Physical Exam:  Black male Old fistula RUE Dialysis catheter RUE Good left radial pulse  No edema No renal bruit  Lungs clear No murmur   Lab Results: Basic Metabolic Panel: Recent Labs    05/29/19 0253 05/30/19 0335  NA 130* 130*  K 4.3 4.5  CL 90* 91*  CO2 22 23  GLUCOSE 96 105*  BUN 45* 34*  CREATININE 10.88* 8.86*  CALCIUM 9.2 9.7   Fasting Lipid Panel: Recent Labs    05/29/19 0256  CHOL 189  HDL 55  LDLCALC 101*  TRIG 163*  CHOLHDL 3.4   Thyroid Function Tests: Recent Labs    05/28/19 1536  TSH 2.442    Imaging: No results found.  Cardiac Studies:  ECG: SR LVH lateral T wave changes    Telemetry:  NSR   Echo: 04/18/19 TEE EF 55-60%   Medications:   . amLODipine  10 mg Oral QHS  . aspirin EC  81 mg Oral QPM  . calcitRIOL  1.25 mcg Oral Q T,Th,Sa-HD  . [START ON 06/04/2019] cloNIDine  0.1 mg Transdermal Weekly  . docusate sodium  100 mg Oral BID  . feeding supplement (ENSURE ENLIVE)  237 mL Oral BID BM  . ferric citrate  630 mg Oral TID WC  . heparin  5,000 Units Subcutaneous Q8H  . hydrALAZINE  100 mg Oral TID  . labetalol  300 mg Oral TID  . losartan  100 mg Oral Daily  . minoxidil  2.5 mg Oral BID  . pantoprazole  40 mg Oral Daily     . sodium chloride    . ceFAZolin 2 g (05/29/19 1055)     Assessment/Plan:   HTN:  Will increase Clonidine patch to 0.2 mg He cannot take oral clonidine due to dizziness   Volume status dialysis dependant so no diuretic  Having repeat CT scan today per Dr Oneida Alar Can arrange outpatient f/u with Cardiologist in [place] where he lives   Will sign off   Jenkins Rouge 05/30/2019, 8:38 AM

## 2019-05-30 NOTE — Progress Notes (Addendum)
[facility] Progress Note  Subjective: BP's not great control, 150/ 90 average  Vitals:   05/30/19 0425 05/30/19 0810 05/30/19 1000 05/30/19 1132  BP: (!) 159/107 (!) 170/98 (!) 141/97 (!) 128/92  Pulse: 84 82  79  Resp: 17 (!) 22 17 16   Temp: 98.7 F (37.1 C) 98.5 F (36.9 C)  99.1 F (37.3 C)  TempSrc: Oral Oral  Oral  SpO2: 96%     Weight: 87.7 kg     Height:        Inpatient medications: . amLODipine  10 mg Oral QHS  . aspirin EC  81 mg Oral QPM  . calcitRIOL  1.25 mcg Oral Q T,Th,Sa-HD  . cloNIDine  0.2 mg Transdermal Q Fri  . docusate sodium  100 mg Oral BID  . feeding supplement (ENSURE ENLIVE)  237 mL Oral BID BM  . ferric citrate  630 mg Oral TID WC  . heparin  5,000 Units Subcutaneous Q8H  . hydrALAZINE  100 mg Oral TID  . labetalol  300 mg Oral TID  . losartan  100 mg Oral Daily  . minoxidil  2.5 mg Oral BID  . pantoprazole  40 mg Oral Daily   . sodium chloride    . ceFAZolin 2 g (05/29/19 1055)   acetaminophen **OR** acetaminophen, fentaNYL (SUBLIMAZE) injection, ferric citrate, guaiFENesin-dextromethorphan, labetalol, levalbuterol, metoprolol tartrate, ondansetron, oxyCODONE, phenol    Exam:  alert, no distress  no jvd  Chest cta bilat   Cor reg no rg   Abd soft no ascites   Ext no edema   RIJ TDC   Dialysis: [facility]  4h 69min  400/1.5   87kg  2/2.25 bath  P2  RIJ TDC  Hep 3000  Mircera 33mcg IV q 2 weeks  Parsabiv 2.73mcg IV TIW with HD Calcitriol 1.25 mcg PO Q HD  Assessment/ Plan: 1. Type B acute aortic dissection/ chest pain/ hypertensive emergency: per VVS no current indication for operative intervention. BP controlled w/ IV esmolol initially, now is off drip and titrating BP meds 2.  ESRD:  usual HD TTS. Back on schedule. HD tomorrow. NO HEPARIN due to acute aortic dissection.  Using [facility].  3.  Hypertension/volume: getting all his home BP meds here - norvasc 10 - hydralazine 100 tid - labetalol 300 tid - losartan 100  qd - clonidine patch (doesn't tolerate pills) started here per cardiology, titrating up to #2 today  - added minoxidil 2.5 bid, cont low dose for now - can prob get new dry wt of 85.5kg - needs to keep max ID wt gains max 2-3kg or less 4.  Anemia: Hemoglobin 11.0, on mircera as an outpatient. Not due for ESA yet.   5.  Metabolic bone disease: Calcium 9.4. Phos pending. Will continue calcitriol. Parsabiv not available inpatient- follow calcium. Continue auryxia. 6.  Nutrition:  Renal diet with fluid restrictions recommended once no longer NPO.  7. Recent MSSA bacteremia/ AVG infection: had infection in AVG and had revision in 5/20, then partial resection/ revision in 6/20, then all AVG material (x2) removed 6/30. 6 wks abx IV Ancef w/ HD were prescribed.    [facility] 05/30/2019, 12:18 PM  Iron/TIBC/Ferritin/ %Sat    Component Value Date/Time   IRON 38 (L) 04/22/2019 1130   TIBC 211 (L) 04/22/2019 1130   FERRITIN 305 08/03/2017 1028   IRONPCTSAT 18 04/22/2019 1130   Recent Labs  Lab 05/27/19 0538  05/30/19 0335  NA 136   < >  130*  K 5.5*   < > 4.5  CL 98   < > 91*  CO2 23   < > 23  GLUCOSE 99   < > 105*  BUN 39*   < > 34*  CREATININE 9.80*   < > 8.86*  CALCIUM 9.1   < > 9.7  ALBUMIN 3.3*  --   --    < > = values in this interval not displayed.   Recent Labs  Lab 05/27/19 0538  AST 12*  ALT <5  ALKPHOS 55  BILITOT 0.5  PROT 7.0   Recent Labs  Lab 05/27/19 0538  WBC 5.9  HGB 10.5*  HCT 32.3*  PLT 173

## 2019-05-30 NOTE — Plan of Care (Signed)
  Problem: Education: Goal: Knowledge of General Education information will improve Description Including pain rating scale, medication(s)/side effects and non-pharmacologic comfort measures Outcome: Progressing   Problem: Health Behavior/Discharge Planning: Goal: Ability to manage health-related needs will improve Outcome: Progressing   

## 2019-05-30 NOTE — Progress Notes (Signed)
100 mls Omni 350 IV extravasated in IV placed in left forearm. Some contrast leaked from IV and some leaked from pt after removing IV. Pt's arm was elevated. Ice pack applied. Pt assessed by Dr Jorje Guild. Extravasation orders were placed and verbal handoff given to RN, Truman Hayward. RN notified of need for new IV.

## 2019-05-30 NOTE — Progress Notes (Signed)
Assessed extravasated IV site good CMS good pulse swelling 1+

## 2019-05-30 NOTE — Progress Notes (Signed)
Assessed extravasated IV site swelling noted 1+, good CMS distal and proximal to site will continue to monitor.

## 2019-05-30 NOTE — Progress Notes (Addendum)
[facility]  Subjective  - No new complaints.  Denise CP, Abdominal pain and lumbar pain.     Objective (!) 128/92 79 99.1 F (37.3 C) (Oral) 16 96%  Intake/Output Summary (Last 24 hours) at 05/30/2019 1201 Last data filed at 05/30/2019 1015 Gross per 24 hour  Intake 150 ml  Output -  Net 150 ml    Palpable Radial and DP pulses B UE/LE Moving all 4 ext. Lungs non labored breathing  CTA CHEST FINDINGS  Cardiovascular: Preferential opacification of the thoracic aorta. No change in caliber or configuration of the dissected descending thoracic aorta, dissection flap originating at the distal descending thoracic aorta and extending to the diaphoretic hiatus, just proximal to the origin of the celiac axis. The descending thoracic aorta measures up to 4.1 x 3.6 cm in the mid descending portion of the vessel. Normal contour and caliber of the aortic root and proximal aorta, with moderate mixed calcific atherosclerosis. There is very limited opacification of the false lumen proximally and there is fenestration distally with retrograde opacification of the false lumen. Mild cardiomegaly. Scattered coronary artery calcifications. No pericardial effusion.   Assessment/Planning: Type B aortic dissection  CTA stable without changes, no new complaints of CP/Abd/lumbar pain.   BP  128-130/ 79 Clonidine new dose 0.2 patch placed today @ 1200.   He has his prescriptions prefilled through the [place].  Social work and care management involved.    Dr. Johnsie Cancel with Cardiology increased Clonidine patch for HTN control to 0.2mg  Q weekly.  BP currently 130/89.     Roxy Horseman 05/30/2019 12:01 PM -- CTA reviewed no change in dissection.  Agree with Dr Jonnie Finner that BP control is very suboptimal at this point.  Appreciate his recommendations hopefully minoxidil and clonidine additions will improve control otherwise he is very high risk to extend his  dissection or even rupture.  He will need to remain inpatient until we get consistent good control of BP  Ruta Hinds, MD [facility] [place]: [phone] Pager: [phone]  Laboratory Lab Results: No results for input(s): WBC, HGB, HCT, PLT in the last 72 hours. BMET Recent Labs    05/29/19 0253 05/30/19 0335  NA 130* 130*  K 4.3 4.5  CL 90* 91*  CO2 22 23  GLUCOSE 96 105*  BUN 45* 34*  CREATININE 10.88* 8.86*  CALCIUM 9.2 9.7    COAG Lab Results  Component Value Date   INR 1.2 04/23/2019   INR 1.2 04/22/2019   INR 1.2 04/14/2019   No results found for: PTT

## 2019-05-30 NOTE — TOC Initial Note (Signed)
Transition of Care [facility]) - Initial/Assessment Note    Patient Details  Name: Frank Fuller MRN: [id number] Date of Birth: 1960/11/24  Transition of Care [facility]) CM/SW Contact:    Sherrilyn Rist Advanced Care Supervisor Phone Number: [phone]  05/30/2019, 11:16 AM  Clinical Narrative:                 CM talked to patient; he lives at home; has private insurance with [employer] [facility]; he gets all of his medication through the Mail from the [place]; he continues to drive, independent of all of his ADL's; DME - has a cane at home and [place] it as needed. CM talked to patient about unable to get his medication? Patient stated that it was an issue with his pain medication but it has now been resolved. No needs identified at this time. CM /SW will continue to follow for progression of care.   Expected Discharge Plan: Home/Self Care Barriers to Discharge: No Barriers Identified   Patient Goals and CMS Choice Patient states their goals for this hospitalization and ongoing recovery are:: to get out of here      Expected Discharge Plan and Services Expected Discharge Plan: Home/Self Care   Discharge Planning Services: CM Consult   Living arrangements for the past 2 months: Single Family Home                                      Prior Living Arrangements/Services Living arrangements for the past 2 months: Single Family Home Lives with:: Self Patient language and need for interpreter reviewed:: Yes        Need for Family Participation in Patient Care: No (Comment) Care giver support system in place?: Yes (comment)   Criminal Activity/Legal Involvement Pertinent to Current Situation/Hospitalization: No - Comment as needed  Activities of Daily Living Home Assistive Devices/Equipment: Cane (specify quad or straight)(straight) ADL Screening (condition at time of admission) Patient's cognitive ability adequate to safely complete daily  activities?: Yes Is the patient deaf or have difficulty hearing?: No Does the patient have difficulty seeing, even when wearing glasses/contacts?: No Does the patient have difficulty concentrating, remembering, or making decisions?: No Patient able to express need for assistance with ADLs?: Yes Does the patient have difficulty dressing or bathing?: No Independently performs ADLs?: Yes (appropriate for developmental age) Does the patient have difficulty walking or climbing stairs?: No Weakness of Legs: Both Weakness of Arms/Hands: None  Permission Sought/Granted Permission sought to share information with : Case Manager                Emotional Assessment Appearance:: Appears younger than stated age Attitude/Demeanor/Rapport: Engaged Affect (typically observed): Accepting Orientation: : Oriented to Self, Oriented to Place, Oriented to  Time, Oriented to Situation Alcohol / Substance Use: Not Applicable Psych Involvement: No (comment)  Admission diagnosis:  AAA Patient Active Problem List   Diagnosis Date Noted  . Aortic dissection distal to left subclavian ([place]) 05/26/2019  . Problem with dialysis access ([place]) 04/22/2019  . Acute blood loss anemia 04/22/2019  . MSSA bacteremia 04/15/2019  . Pneumonia 04/14/2019  . Hospital-acquired pneumonia 04/14/2019  . ESRD needing dialysis ([place]) 11/11/2018  . Hemoptysis   . Pulmonary edema 08/02/2017  . Acute kidney injury superimposed on chronic kidney disease ([place]) 07/20/2017  . ESRD on dialysis ([place]) 07/19/2017  . ESRD on hemodialysis ([place]) 07/16/2017  . AAA (  abdominal aortic aneurysm) without rupture ([place]) 07/15/2017  . Hypertensive urgency 07/14/2017  . Acute encephalopathy   . Wide-complex tachycardia ([place])   . CVA (cerebral vascular accident) ([place]) 12/13/2016  . Hyponatremia 12/13/2016  . SVT (supraventricular tachycardia) ([place])   . 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 ARF (acute renal failure) ([place]) 12/10/2016   . Thrombocytopenia ([place]) 12/10/2016  . Elevated troponin 12/10/2016  . Accelerated hypertension 12/10/2016  . Hiccups 12/10/2016  . Elevated bilirubin 12/10/2016  . Nausea with vomiting 12/10/2016  . Uremia 12/10/2016   PCP:  [place]:   [facility] [place], [address] AT [place] [address] [place] [address] [place] [address] Phone: [phone] Fax: [place], [address] [place] [place] [address] Phone: [phone] Fax: [phone]     Social Determinants of Health (SDOH) Interventions    Readmission Risk Interventions No flowsheet data found.

## 2019-05-30 NOTE — Care Management Important Message (Signed)
Important Message  Patient Details  Name: Frank Fuller MRN: [id number] Date of Birth: [birth year]   Medicare Important Message Given:  Yes     [name] Montine Circle 05/30/2019, 3:58 PM

## 2019-05-31 LAB — BASIC METABOLIC PANEL
Anion gap: 18 — ABNORMAL HIGH (ref 5–15)
BUN: 51 mg/dL — ABNORMAL HIGH (ref 6–20)
CO2: 21 mmol/L — ABNORMAL LOW (ref 22–32)
Calcium: 9.5 mg/dL (ref 8.9–10.3)
Chloride: 87 mmol/L — ABNORMAL LOW (ref 98–111)
Creatinine, Ser: 11.25 mg/dL — ABNORMAL HIGH (ref 0.61–1.24)
GFR calc Af Amer: 5 mL/min — ABNORMAL LOW (ref >60)
GFR calc non Af Amer: 4 mL/min — ABNORMAL LOW (ref >60)
Glucose, Bld: 102 mg/dL — ABNORMAL HIGH (ref 70–99)
Potassium: 4.5 mmol/L (ref 3.5–5.1)
Sodium: 126 mmol/L — ABNORMAL LOW (ref 135–145)

## 2019-05-31 MED ORDER — LABETALOL HCL 300 MG PO TABS: 300.0000 mg | ORAL_TABLET | Freq: Three times a day (TID) | ORAL | 3 refills | Status: DC

## 2019-05-31 MED ORDER — MINOXIDIL 2.5 MG PO TABS
5.0000 mg | ORAL_TABLET | Freq: Two times a day (BID) | ORAL | Status: DC
  Filled 2019-05-31 (×2): qty 2

## 2019-05-31 MED ORDER — HEPARIN SODIUM (PORCINE) 1000 UNIT/ML IJ SOLN
INTRAMUSCULAR | Status: AC
  Filled 2019-05-31: qty 4

## 2019-05-31 MED ORDER — CLONIDINE 0.2 MG/24HR TD PTWK: 0.2000 mg | MEDICATED_PATCH | TRANSDERMAL | 12 refills | Status: DC

## 2019-05-31 MED ORDER — MINOXIDIL 2.5 MG PO TABS: 5.0000 mg | ORAL_TABLET | Freq: Two times a day (BID) | ORAL | 3 refills | Status: DC

## 2019-05-31 NOTE — Progress Notes (Signed)
Reassessed BP 153/95 will continue to monitor

## 2019-05-31 NOTE — Progress Notes (Signed)
Pt. Left the floor at 6:56 pm refused to go in a wheel chair and refused to be escorted out.

## 2019-05-31 NOTE — Progress Notes (Signed)
    Subjective  -   No complaints this am   Physical Exam:  Abdomen soft Palpable bilateral femoral pulses Non-labored breathing   Repeat CT yesterday without significant change in dissection   Assessment/Plan:    Malignant hypertension:  Patient still with severely elevated blood pressure overnight.  Appreciate Dr. Melvia Heaps assistance with management.  Goal is consistent SBP <120.  If not attainable, may need to consider repair of dissection Renal arteries widely patent on CTA  Tyoe III Aortic dissection:  No signs of malperfusion.  Continue to treat medically.  Wells Karina Nofsinger 05/31/2019 7:32 AM --  Vitals:   05/31/19 0436 05/31/19 0443  BP: (!) 171/90 (!) 153/95  Pulse: 79 74  Resp: 17 13  Temp:    SpO2: 96% 96%    Intake/Output Summary (Last 24 hours) at 05/31/2019 0732 Last data filed at 05/30/2019 1015 Gross per 24 hour  Intake 150 ml  Output -  Net 150 ml     Laboratory CBC    Component Value Date/Time   WBC 5.9 05/27/2019 0538   HGB 10.5 (L) 05/27/2019 0538   HCT 32.3 (L) 05/27/2019 0538   PLT 173 05/27/2019 0538    BMET    Component Value Date/Time   NA 126 (L) 05/31/2019 0233   K 4.5 05/31/2019 0233   CL 87 (L) 05/31/2019 0233   CO2 21 (L) 05/31/2019 0233   GLUCOSE 102 (H) 05/31/2019 0233   BUN 51 (H) 05/31/2019 0233   CREATININE 11.25 (H) 05/31/2019 0233   CALCIUM 9.5 05/31/2019 0233   GFRNONAA 4 (L) 05/31/2019 0233   GFRAA 5 (L) 05/31/2019 0233    COAG Lab Results  Component Value Date   INR 1.2 04/23/2019   INR 1.2 04/22/2019   INR 1.2 04/14/2019   No results found for: PTT  Antibiotics Anti-infectives (From admission, onward)   Start     Dose/Rate Route Frequency Ordered Stop   05/27/19 1800  ceFAZolin (ANCEF) IVPB 2g/100 mL premix     2 g 200 mL/hr over 30 Minutes Intravenous Every T-Th-Sa (1800) 05/26/19 1228         V. Leia Alf, M.D., [facility] [place] Office: [phone]  Pager:  [phone]

## 2019-05-31 NOTE — Progress Notes (Signed)
Discharged to home with family office visits in place teaching done  

## 2019-05-31 NOTE — Discharge Summary (Signed)
Physician Discharge Summary  Patient ID: Frank Fuller MRN: [id number] DOB/AGE: 12-16-1960 58 y.o.  Admit date: 05/26/2019 Discharge date: 05/31/2019  Admission Diagnoses:  Discharge Diagnoses:  Active Problems:   Aortic dissection distal to left subclavian [facility])   Discharged Condition: fair  Hospital Course: 58 year old admitted with type B aortic dissection.  He is pain free and does not have symptoms of malperfusion.  After HD today, he said he had to leave due to personal matters, and understands that it is against medical advice  Consults: renal and cardiology  Significant Diagnostic Studies: CTA x 2 \  Discharge Exam: Blood pressure 133/80, pulse 77, temperature 98.1 F (36.7 C), temperature source Oral, resp. rate 17, height 6\' 3"  (1.905 m), weight 89.9 kg, SpO2 97 %. Palpable femoral pulses, abd soft  Disposition: Discharge disposition: 01-Home or Self Care       Discharge Instructions    Call MD for:  redness, tenderness, or signs of infection (pain, swelling, bleeding, redness, odor or green/yellow discharge around incision site)   Complete by: As directed    Call MD for:  severe or increased pain, loss or decreased feeling  in affected limb(s)   Complete by: As directed    Call MD for:  temperature >100.5   Complete by: As directed    Resume previous diet   Complete by: As directed      Allergies as of 05/31/2019      Reactions   Oxycodone Nausea Only   Chlorhexidine Other (See Comments)   Unknown reaction Patch skin test done at dialysis 06/26/17  - staff using clear dressing and alcohol to clean exit site of catheter   Clonidine Derivatives Other (See Comments)   Dizziness    Lisinopril Other (See Comments)   unresponsive   Carvedilol Rash      Medication List    STOP taking these medications   acetaminophen 500 MG tablet Commonly known as: TYLENOL   ceFAZolin 2-4 GM/100ML-% IVPB Commonly known as: ANCEF   HYDROcodone-acetaminophen 5-325 MG  tablet Commonly known as: Norco     TAKE these medications   amLODipine 10 MG tablet Commonly known as: NORVASC Take 10 mg by mouth at bedtime.   aspirin EC 81 MG tablet Take 81 mg by mouth every evening.   Auryxia 1 GM 210 MG(Fe) tablet Generic drug: ferric citrate Take 420-630 mg by mouth See admin instructions. Take 3 tablets (630 mg) by mouth three times daily with meals, take 2 tablets (420 mg) with snacks   cloNIDine 0.2 mg/24hr patch Commonly known as: CATAPRES - Dosed in mg/24 hr Place 1 patch (0.2 mg total) onto the skin every Friday. Start taking on: June 06, 2019   Ensure Take 237 mLs by mouth 2 (two) times a day.   hydrALAZINE 100 MG tablet Commonly known as: APRESOLINE Take 100 mg by mouth 3 (three) times daily. Hold for systolic blood pressure [id number]   labetalol 300 MG tablet Commonly known as: NORMODYNE Take 300 mg by mouth 3 (three) times daily. What changed: Another medication with the same name was added. Make sure you understand how and when to take each.   labetalol 300 MG tablet Commonly known as: NORMODYNE Take 1 tablet (300 mg total) by mouth 3 (three) times daily. What changed: You were already taking a medication with the same name, and this prescription was added. Make sure you understand how and when to take each.   lidocaine-prilocaine cream Commonly known as: EMLA Apply  1 application topically See admin instructions. Apply topically three times week (Tuesday, Thursday, Saturday) prior to port access   losartan 100 MG tablet Commonly known as: COZAAR Take 1 tablet (100 mg total) by mouth daily.   minoxidil 2.5 MG tablet Commonly known as: LONITEN Take 2 tablets (5 mg total) by mouth 2 (two) times daily.        Signed: Wells Guiliana Shor 05/31/2019, 4:16 PM

## 2019-05-31 NOTE — Progress Notes (Signed)
10 mg Labetolol IV given

## 2019-05-31 NOTE — Progress Notes (Signed)
Patient BP 171/90 10 mg Labetolol given will reassess.

## 2019-05-31 NOTE — Progress Notes (Signed)
Patient ID: Frank Fuller, male   DOB: May 14, 1961, 58 y.o.   MRN: [id number]    Subjective:  No cardiac symptoms  CT f/u today   Objective:  Vitals:   05/31/19 0428 05/31/19 0436 05/31/19 0443 05/31/19 0803  BP: (!) 171/99 (!) 171/90 (!) 153/95 (!) 152/99  Pulse: 80 79 74 82  Resp: 13 17 13 15   Temp:    98.1 F (36.7 C)  TempSrc:    Oral  SpO2: 99% 96% 96% 97%  Weight:      Height:        Intake/Output from previous day:  Intake/Output Summary (Last 24 hours) at 05/31/2019 [id number] Last data filed at 05/30/2019 1015 Gross per 24 hour  Intake 150 ml  Output -  Net 150 ml    Physical Exam:  Black male Old fistula RUE Dialysis catheter RUE Good left radial pulse  No edema No renal bruit  Lungs clear No murmur   Lab Results: Basic Metabolic Panel: Recent Labs    05/30/19 0335 05/31/19 0233  NA 130* 126*  K 4.5 4.5  CL 91* 87*  CO2 23 21*  GLUCOSE 105* 102*  BUN 34* 51*  CREATININE 8.86* 11.25*  CALCIUM 9.7 9.5   Fasting Lipid Panel: Recent Labs    05/29/19 0256  CHOL 189  HDL 55  LDLCALC 101*  TRIG 163*  CHOLHDL 3.4   Thyroid Function Tests: Recent Labs    05/28/19 1536  TSH 2.442    Imaging: Ct Angio Chest/abd/pel For Dissection W And/or W/wo  Result Date: 05/30/2019 CLINICAL DATA:  Follow-up thoracic aortic dissection EXAM: CT ANGIOGRAPHY CHEST, ABDOMEN AND PELVIS TECHNIQUE: Multidetector CT imaging through the chest, abdomen and pelvis was performed using the standard protocol during bolus administration of intravenous contrast. Multiplanar reconstructed images and MIPs were obtained and reviewed to evaluate the vascular anatomy. CONTRAST:  186mL OMNIPAQUE IOHEXOL 350 MG/ML SOLN COMPARISON:  05/26/2019 FINDINGS: CTA CHEST FINDINGS Cardiovascular: Preferential opacification of the thoracic aorta. No change in caliber or configuration of the dissected descending thoracic aorta, dissection flap originating at the distal descending thoracic aorta and  extending to the diaphoretic hiatus, just proximal to the origin of the celiac axis. The descending thoracic aorta measures up to 4.1 x 3.6 cm in the mid descending portion of the vessel. Normal contour and caliber of the aortic root and proximal aorta, with moderate mixed calcific atherosclerosis. There is very limited opacification of the false lumen proximally and there is fenestration distally with retrograde opacification of the false lumen. Mild cardiomegaly. Scattered coronary artery calcifications. No pericardial effusion. Mediastinum/Nodes: No enlarged mediastinal, hilar, or axillary lymph nodes. Thyroid gland, trachea, and esophagus demonstrate no significant findings. Lungs/Pleura: Mild centrilobular emphysema. Trace left pleural effusion. Unchanged irregular opacity in the central right upper lobe (series 3, image 71) and inferior peripheral right upper lobe (series 13, image 90). Bibasilar atelectasis. No pleural effusion or pneumothorax. Musculoskeletal: No chest wall abnormality. No acute or significant osseous findings. Review of the MIP images confirms the above findings. 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 Standard branching pattern of the abdominal aorta without dissection the involvement of the branch vessel origins. Review of the MIP images confirms the above findings. NON-VASCULAR Hepatobiliary: No solid liver abnormality is seen. No gallstones, gallbladder wall thickening, or biliary dilatation. Pancreas: Unremarkable. No pancreatic ductal dilatation or surrounding inflammatory changes. Spleen:  Normal in size without significant abnormality. Adrenals/Urinary Tract: Adrenal glands are unremarkable. Numerous small bilateral renal lesions, some which are clearly fluid attenuation cysts, others incompletely characterized. These were better evaluated by  prior CT dated 04/12/2019. Bladder is unremarkable. Stomach/Bowel: Stomach is within normal limits. Appendix appears normal. No evidence of bowel wall thickening, distention, or inflammatory changes. Lymphatic: No significant vascular findings are present. Reproductive: No mass or other significant abnormality. Other: No abdominal wall hernia or abnormality. No abdominopelvic ascites. Musculoskeletal: No acute or significant osseous findings. Review of the MIP images confirms the above findings. IMPRESSION: 1. No change in caliber or configuration of the dissected descending thoracic aorta, dissection flap originating at the distal descending thoracic aorta and extending to the diaphoretic hiatus, just proximal to the origin of the celiac axis. The descending thoracic aorta measures up to 4.1 x 3.6 cm in the mid descending portion of the vessel. Normal contour and caliber of the aortic root and proximal aorta, with moderate mixed calcific atherosclerosis. There is very limited opacification of the false lumen proximally and there is fenestration distally with retrograde opacification of the false lumen. 2. 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 3. Mild centrilobular emphysema. Trace left pleural effusion. Unchanged irregular opacity in the central right upper lobe (series 3, image 71) and inferior peripheral right upper lobe (series 13, image 90), generally infectious or inflammatory. Attention on follow-up. 4.  Coronary artery disease. Electronically Signed   By: Eddie Candle M.D.   On: 05/30/2019 08:55    Cardiac Studies:  ECG: SR LVH lateral T wave changes    Telemetry:  NSR   Echo: 04/18/19 TEE EF 55-60%   Medications:   . amLODipine  10 mg Oral QHS  . aspirin EC  81 mg Oral QPM  . calcitRIOL  1.25 mcg Oral Q T,Th,Sa-HD  . Chlorhexidine Gluconate Cloth  6 each Topical Q0600  . cloNIDine  0.2 mg  Transdermal Q Fri  . docusate sodium  100 mg Oral BID  . feeding supplement (ENSURE ENLIVE)  237 mL Oral BID BM  . ferric citrate  630 mg Oral TID WC  . heparin  5,000 Units Subcutaneous Q8H  . hydrALAZINE  100 mg Oral TID  . labetalol  300 mg Oral TID  . losartan  100 mg Oral Daily  . minoxidil  5 mg Oral BID  . pantoprazole  40 mg Oral Daily     . sodium chloride    . ceFAZolin 2 g (05/29/19 1055)    Assessment/Plan:   HTN:  Clonidine patch increased yesterday to 0.2 TTS He cannot take oral clonidine due to dizziness   Volume status dialysis dependant so no diuretic  F/U CT 8/7 stable 4.1 cm    Can arrange outpatient f/u with Cardiologist in [place] where he lives   Will sign off   Jenkins Rouge 05/31/2019, 8:19 AM

## 2019-05-31 NOTE — Progress Notes (Signed)
Pt. Called me in the room and told me he had to go home even if it ment going [facility] I paged the doctor and let the pt. Talk to the doctor.

## 2019-05-31 NOTE — Progress Notes (Signed)
[facility] Progress Note  Subjective: BP's still marginal , no c/o  Vitals:   05/31/19 1123 05/31/19 1138 05/31/19 1230 05/31/19 1300  BP: (!) 160/92  (!) 155/88 132/73  Pulse: 76 76 77 69  Resp: 17 15 15 13   Temp: 98.1 F (36.7 C)     TempSrc: Oral     SpO2: 97%     Weight: 89.9 kg     Height:        Inpatient medications: . amLODipine  10 mg Oral QHS  . aspirin EC  81 mg Oral QPM  . calcitRIOL  1.25 mcg Oral Q T,Th,Sa-HD  . Chlorhexidine Gluconate Cloth  6 each Topical Q0600  . cloNIDine  0.2 mg Transdermal Q Fri  . docusate sodium  100 mg Oral BID  . feeding supplement (ENSURE ENLIVE)  237 mL Oral BID BM  . ferric citrate  630 mg Oral TID WC  . heparin  5,000 Units Subcutaneous Q8H  . hydrALAZINE  100 mg Oral TID  . labetalol  300 mg Oral TID  . losartan  100 mg Oral Daily  . minoxidil  5 mg Oral BID  . pantoprazole  40 mg Oral Daily   . sodium chloride    . ceFAZolin 2 g (05/29/19 1055)   acetaminophen **OR** acetaminophen, fentaNYL (SUBLIMAZE) injection, ferric citrate, guaiFENesin-dextromethorphan, labetalol, levalbuterol, metoprolol tartrate, ondansetron, oxyCODONE, phenol    Exam:  alert, no distress  no jvd  Chest cta bilat   Cor reg no rg   Abd soft no ascites   Ext no edema   RIJ TDC   Dialysis: [facility]  4h 33min  400/1.5   87kg  2/2.25 bath  P2  RIJ TDC  Hep 3000  Mircera 78mcg IV q 2 weeks  Parsabiv 2.28mcg IV TIW with HD Calcitriol 1.25 mcg PO Q HD  Assessment/ Plan: 1. Type B acute aortic dissection/ chest pain/ hypertensive emergency: per [facility] no current indication for operative intervention. BP controlled w/ IV esmolol initially, now is off drip and titrating BP meds 2.  ESRD:  usual HD TTS. HD today. NO HEPARIN due to acute aortic dissection.  Using [facility].  3.  Hypertension/volume: getting all his home BP meds here - continue all home BP medications > norvasc 10/  hydralazine 100 tid/ labetalol 300 tid/ losartan 100 qd -  clonidine patch (doesn't tolerate pills) started here per cardiology, titrated up to #2 - added minoxidil 2.5 bid here >  will ^ 5mg  bid today - lower vol w/ HD as tolerated, reduce edw if possible - needs to keep max ID wt gains max 2-3kg or less 4.  Anemia: Hemoglobin 11.0, on mircera as an outpatient. Not due for ESA yet.   5.  Metabolic bone disease: Calcium 9.4. Phos pending. Will continue calcitriol. Parsabiv not available inpatient- follow calcium. Continue auryxia. 6.  Nutrition:  Renal diet with fluid restrictions recommended once no longer NPO.  7. Recent MSSA bacteremia/ AVG infection: had infection in AVG and had revision in 5/20, then partial resection/ revision in 6/20, then all AVG material (x2) removed 6/30. 6 wks abx IV Ancef w/ HD were prescribed.    [facility] 05/31/2019, 1:19 PM  Iron/TIBC/Ferritin/ %Sat    Component Value Date/Time   IRON 38 (L) 04/22/2019 1130   TIBC 211 (L) 04/22/2019 1130   FERRITIN 305 08/03/2017 1028   IRONPCTSAT 18 04/22/2019 1130   Recent Labs  Lab 05/27/19 0538  05/31/19 [id number]  NA 136   < > 126*  K 5.5*   < > 4.5  CL 98   < > 87*  CO2 23   < > 21*  GLUCOSE 99   < > 102*  BUN 39*   < > 51*  CREATININE 9.80*   < > 11.25*  CALCIUM 9.1   < > 9.5  ALBUMIN 3.3*  --   --    < > = values in this interval not displayed.   Recent Labs  Lab 05/27/19 0538  AST 12*  ALT <5  ALKPHOS 55  BILITOT 0.5  PROT 7.0   Recent Labs  Lab 05/27/19 0538  WBC 5.9  HGB 10.5*  HCT 32.3*  PLT 173

## 2019-06-03 ENCOUNTER — Inpatient Hospital Stay (HOSPITAL_COMMUNITY)
Admission: EM | Admit: 2019-06-03 | Discharge: 2019-06-12 | DRG: 219 | Disposition: A | Discharge status: Home Health | Payer: Medicare Other | Attending: Internal Medicine | Admitting: Internal Medicine

## 2019-06-03 ENCOUNTER — Emergency Department (HOSPITAL_COMMUNITY): Payer: Medicare Other

## 2019-06-03 ENCOUNTER — Other Ambulatory Visit: Payer: Self-pay

## 2019-06-03 ENCOUNTER — Encounter (HOSPITAL_COMMUNITY): Payer: Self-pay | Admitting: Emergency Medicine

## 2019-06-03 ENCOUNTER — Telehealth: Payer: Self-pay | Admitting: *Deleted

## 2019-06-03 DIAGNOSIS — R7989 Other specified abnormal findings of blood chemistry: Secondary | ICD-10-CM | POA: Diagnosis present

## 2019-06-03 DIAGNOSIS — Z9119 Patient's noncompliance with other medical treatment and regimen: Secondary | ICD-10-CM | POA: Diagnosis not present

## 2019-06-03 DIAGNOSIS — J432 Centrilobular emphysema: Secondary | ICD-10-CM | POA: Diagnosis present

## 2019-06-03 DIAGNOSIS — E8889 Other specified metabolic disorders: Secondary | ICD-10-CM | POA: Diagnosis present

## 2019-06-03 DIAGNOSIS — I9581 Postprocedural hypotension: Secondary | ICD-10-CM | POA: Diagnosis not present

## 2019-06-03 DIAGNOSIS — I714 Abdominal aortic aneurysm, without rupture: Secondary | ICD-10-CM | POA: Diagnosis present

## 2019-06-03 DIAGNOSIS — G8220 Paraplegia, unspecified: Secondary | ICD-10-CM | POA: Diagnosis not present

## 2019-06-03 DIAGNOSIS — H409 Unspecified glaucoma: Secondary | ICD-10-CM | POA: Diagnosis present

## 2019-06-03 DIAGNOSIS — R0789 Other chest pain: Secondary | ICD-10-CM | POA: Diagnosis present

## 2019-06-03 DIAGNOSIS — I441 Atrioventricular block, second degree: Secondary | ICD-10-CM | POA: Diagnosis present

## 2019-06-03 DIAGNOSIS — I10 Essential (primary) hypertension: Secondary | ICD-10-CM

## 2019-06-03 DIAGNOSIS — I120 Hypertensive chronic kidney disease with stage 5 chronic kidney disease or end stage renal disease: Secondary | ICD-10-CM | POA: Diagnosis present

## 2019-06-03 DIAGNOSIS — Z452 Encounter for adjustment and management of vascular access device: Secondary | ICD-10-CM | POA: Diagnosis not present

## 2019-06-03 DIAGNOSIS — I7101 Dissection of thoracic aorta: Secondary | ICD-10-CM | POA: Diagnosis present

## 2019-06-03 DIAGNOSIS — E875 Hyperkalemia: Secondary | ICD-10-CM | POA: Diagnosis not present

## 2019-06-03 DIAGNOSIS — Z7982 Long term (current) use of aspirin: Secondary | ICD-10-CM

## 2019-06-03 DIAGNOSIS — D631 Anemia in chronic kidney disease: Secondary | ICD-10-CM | POA: Diagnosis present

## 2019-06-03 DIAGNOSIS — N186 End stage renal disease: Secondary | ICD-10-CM | POA: Diagnosis present

## 2019-06-03 DIAGNOSIS — I700 Atherosclerosis of aorta: Secondary | ICD-10-CM | POA: Diagnosis present

## 2019-06-03 DIAGNOSIS — L819 Disorder of pigmentation, unspecified: Secondary | ICD-10-CM | POA: Diagnosis present

## 2019-06-03 DIAGNOSIS — G8222 Paraplegia, incomplete: Secondary | ICD-10-CM | POA: Diagnosis not present

## 2019-06-03 DIAGNOSIS — I7102 Dissection of abdominal aorta: Secondary | ICD-10-CM | POA: Diagnosis not present

## 2019-06-03 DIAGNOSIS — Z79899 Other long term (current) drug therapy: Secondary | ICD-10-CM

## 2019-06-03 DIAGNOSIS — I161 Hypertensive emergency: Secondary | ICD-10-CM | POA: Diagnosis present

## 2019-06-03 DIAGNOSIS — I160 Hypertensive urgency: Secondary | ICD-10-CM | POA: Diagnosis not present

## 2019-06-03 DIAGNOSIS — Z8249 Family history of ischemic heart disease and other diseases of the circulatory system: Secondary | ICD-10-CM

## 2019-06-03 DIAGNOSIS — Z720 Tobacco use: Secondary | ICD-10-CM | POA: Diagnosis not present

## 2019-06-03 DIAGNOSIS — I444 Left anterior fascicular block: Secondary | ICD-10-CM | POA: Diagnosis present

## 2019-06-03 DIAGNOSIS — F17210 Nicotine dependence, cigarettes, uncomplicated: Secondary | ICD-10-CM | POA: Diagnosis present

## 2019-06-03 DIAGNOSIS — Z8673 Personal history of transient ischemic attack (TIA), and cerebral infarction without residual deficits: Secondary | ICD-10-CM

## 2019-06-03 DIAGNOSIS — I71019 Dissection of thoracic aorta, unspecified: Secondary | ICD-10-CM

## 2019-06-03 DIAGNOSIS — Z20828 Contact with and (suspected) exposure to other viral communicable diseases: Secondary | ICD-10-CM | POA: Diagnosis present

## 2019-06-03 DIAGNOSIS — Z8619 Personal history of other infectious and parasitic diseases: Secondary | ICD-10-CM

## 2019-06-03 DIAGNOSIS — R609 Edema, unspecified: Secondary | ICD-10-CM | POA: Diagnosis not present

## 2019-06-03 DIAGNOSIS — K0889 Other specified disorders of teeth and supporting structures: Secondary | ICD-10-CM | POA: Diagnosis present

## 2019-06-03 DIAGNOSIS — K08409 Partial loss of teeth, unspecified cause, unspecified class: Secondary | ICD-10-CM | POA: Diagnosis present

## 2019-06-03 DIAGNOSIS — L03114 Cellulitis of left upper limb: Secondary | ICD-10-CM | POA: Diagnosis not present

## 2019-06-03 DIAGNOSIS — Z87828 Personal history of other (healed) physical injury and trauma: Secondary | ICD-10-CM

## 2019-06-03 DIAGNOSIS — I7100 Dissection of unspecified site of aorta: Secondary | ICD-10-CM | POA: Diagnosis not present

## 2019-06-03 DIAGNOSIS — Z885 Allergy status to narcotic agent status: Secondary | ICD-10-CM

## 2019-06-03 DIAGNOSIS — Z888 Allergy status to other drugs, medicaments and biological substances status: Secondary | ICD-10-CM

## 2019-06-03 DIAGNOSIS — Z992 Dependence on renal dialysis: Secondary | ICD-10-CM | POA: Diagnosis not present

## 2019-06-03 LAB — SARS CORONAVIRUS 2 (TAT 6-24 HRS): SARS Coronavirus 2: NEGATIVE

## 2019-06-03 LAB — CBC
HCT: 35.4 % — ABNORMAL LOW (ref 39.0–52.0)
Hemoglobin: 11.6 g/dL — ABNORMAL LOW (ref 13.0–17.0)
MCH: 31.6 pg (ref 26.0–34.0)
MCHC: 32.8 g/dL (ref 30.0–36.0)
MCV: 96.5 fL (ref 80.0–100.0)
Platelets: 260 10*3/uL (ref 150–400)
RBC: 3.67 MIL/uL — ABNORMAL LOW (ref 4.22–5.81)
RDW: 17 % — ABNORMAL HIGH (ref 11.5–15.5)
WBC: 7.2 10*3/uL (ref 4.0–10.5)
nRBC: 0 % (ref 0.0–0.2)

## 2019-06-03 LAB — SARS CORONAVIRUS 2 BY RT PCR (HOSPITAL ORDER, PERFORMED IN ~~LOC~~ HOSPITAL LAB): SARS Coronavirus 2: NEGATIVE

## 2019-06-03 LAB — BASIC METABOLIC PANEL
Anion gap: 17 — ABNORMAL HIGH (ref 5–15)
BUN: 37 mg/dL — ABNORMAL HIGH (ref 6–20)
CO2: 25 mmol/L (ref 22–32)
Calcium: 9.7 mg/dL (ref 8.9–10.3)
Chloride: 93 mmol/L — ABNORMAL LOW (ref 98–111)
Creatinine, Ser: 9.4 mg/dL — ABNORMAL HIGH (ref 0.61–1.24)
GFR calc Af Amer: 6 mL/min — ABNORMAL LOW (ref >60)
GFR calc non Af Amer: 5 mL/min — ABNORMAL LOW (ref >60)
Glucose, Bld: 100 mg/dL — ABNORMAL HIGH (ref 70–99)
Potassium: 4.8 mmol/L (ref 3.5–5.1)
Sodium: 135 mmol/L (ref 135–145)

## 2019-06-03 LAB — TROPONIN I (HIGH SENSITIVITY): Troponin I (High Sensitivity): 56 ng/L — ABNORMAL HIGH (ref <18)

## 2019-06-03 MED ORDER — HYDROMORPHONE HCL 1 MG/ML IJ SOLN
1.0000 mg | Freq: Once | INTRAMUSCULAR | Status: AC
  Administered 2019-06-03: 1 mg via INTRAVENOUS
  Filled 2019-06-03: qty 1

## 2019-06-03 MED ORDER — SODIUM CHLORIDE 0.9% FLUSH: 3.0000 mL | Freq: Once | INTRAVENOUS | Status: DC

## 2019-06-03 MED ORDER — ASPIRIN EC 81 MG PO TBEC: 81.0000 mg | DELAYED_RELEASE_TABLET | Freq: Every evening | ORAL | Status: DC

## 2019-06-03 MED ORDER — LABETALOL HCL 300 MG PO TABS
300.0000 mg | ORAL_TABLET | Freq: Three times a day (TID) | ORAL | Status: DC
  Administered 2019-06-03 – 2019-06-06 (×9): 300 mg via ORAL
  Filled 2019-06-03 (×10): qty 1

## 2019-06-03 MED ORDER — IOHEXOL 350 MG/ML SOLN
100.0000 mL | Freq: Once | INTRAVENOUS | Status: AC | PRN
  Administered 2019-06-03: 100 mL via INTRAVENOUS

## 2019-06-03 MED ORDER — HYDRALAZINE HCL 50 MG PO TABS
100.0000 mg | ORAL_TABLET | Freq: Three times a day (TID) | ORAL | Status: DC
  Administered 2019-06-03 – 2019-06-06 (×9): 100 mg via ORAL
  Filled 2019-06-03 (×10): qty 2

## 2019-06-03 MED ORDER — ESMOLOL HCL-SODIUM CHLORIDE 2000 MG/100ML IV SOLN
25.0000 ug/kg/min | INTRAVENOUS | Status: DC
  Administered 2019-06-03: 17:00:00 25 ug/kg/min via INTRAVENOUS
  Administered 2019-06-03 (×2): 300 ug/kg/min via INTRAVENOUS
  Administered 2019-06-04: 100 ug/kg/min via INTRAVENOUS
  Administered 2019-06-04: 230 ug/kg/min via INTRAVENOUS
  Administered 2019-06-04: 220 ug/kg/min via INTRAVENOUS
  Administered 2019-06-04: 250 ug/kg/min via INTRAVENOUS
  Administered 2019-06-04: 230 ug/kg/min via INTRAVENOUS
  Administered 2019-06-04: 250 ug/kg/min via INTRAVENOUS
  Administered 2019-06-04: 170 ug/kg/min via INTRAVENOUS
  Administered 2019-06-05: 50 ug/kg/min via INTRAVENOUS
  Filled 2019-06-03 (×19): qty 100

## 2019-06-03 MED ORDER — CLONIDINE HCL 0.2 MG/24HR TD PTWK: 0.2000 mg | MEDICATED_PATCH | TRANSDERMAL | Status: DC

## 2019-06-03 MED ORDER — HYDRALAZINE HCL 20 MG/ML IJ SOLN
10.0000 mg | INTRAMUSCULAR | Status: DC | PRN
  Administered 2019-06-03 – 2019-06-04 (×2): 40 mg via INTRAVENOUS
  Administered 2019-06-04: 20 mg via INTRAVENOUS
  Administered 2019-06-04: 40 mg via INTRAVENOUS
  Administered 2019-06-04: 20 mg via INTRAVENOUS
  Administered 2019-06-04 – 2019-06-05 (×2): 40 mg via INTRAVENOUS
  Administered 2019-06-06 – 2019-06-07 (×2): 20 mg via INTRAVENOUS
  Filled 2019-06-03 (×3): qty 2
  Filled 2019-06-03: qty 1
  Filled 2019-06-03: qty 2
  Filled 2019-06-03: qty 1
  Filled 2019-06-03 (×2): qty 2

## 2019-06-03 MED ORDER — ONDANSETRON HCL 4 MG/2ML IJ SOLN
4.0000 mg | Freq: Once | INTRAMUSCULAR | Status: AC
  Administered 2019-06-03: 4 mg via INTRAVENOUS
  Filled 2019-06-03: qty 2

## 2019-06-03 MED ORDER — CLEVIDIPINE BUTYRATE 0.5 MG/ML IV EMUL
0.0000 mg/h | INTRAVENOUS | Status: DC
  Administered 2019-06-03: 21 mg/h via INTRAVENOUS
  Administered 2019-06-03: 1 mg/h via INTRAVENOUS
  Administered 2019-06-04 (×5): 21 mg/h via INTRAVENOUS
  Administered 2019-06-04: 19 mg/h via INTRAVENOUS
  Administered 2019-06-04 (×5): 21 mg/h via INTRAVENOUS
  Administered 2019-06-04: 17 mg/h via INTRAVENOUS
  Administered 2019-06-04: 21 mg/h via INTRAVENOUS
  Administered 2019-06-04: 16 mg/h via INTRAVENOUS
  Administered 2019-06-04: 23:00:00 17 mg/h via INTRAVENOUS
  Administered 2019-06-04: 19 mg/h via INTRAVENOUS
  Administered 2019-06-04: 21 mg/h via INTRAVENOUS
  Administered 2019-06-05 (×2): 17 mg/h via INTRAVENOUS
  Administered 2019-06-05 (×2): 18 mg/h via INTRAVENOUS
  Administered 2019-06-05: 20:00:00 15 mg/h via INTRAVENOUS
  Administered 2019-06-05: 21 mg/h via INTRAVENOUS
  Administered 2019-06-05: 19 mg/h via INTRAVENOUS
  Administered 2019-06-05: 18 mg/h via INTRAVENOUS
  Administered 2019-06-05: 20 mg/h via INTRAVENOUS
  Administered 2019-06-06: 20:00:00 5 mg/h via INTRAVENOUS
  Administered 2019-06-06: 4 mg/h via INTRAVENOUS
  Administered 2019-06-06: 12 mg/h via INTRAVENOUS
  Administered 2019-06-07: 6 mg/h via INTRAVENOUS
  Filled 2019-06-03 (×2): qty 50
  Filled 2019-06-03 (×2): qty 100
  Filled 2019-06-03 (×8): qty 50
  Filled 2019-06-03: qty 100
  Filled 2019-06-03 (×7): qty 50
  Filled 2019-06-03: qty 100
  Filled 2019-06-03 (×2): qty 50
  Filled 2019-06-03 (×3): qty 100
  Filled 2019-06-03 (×2): qty 50
  Filled 2019-06-03: qty 100
  Filled 2019-06-03: qty 50
  Filled 2019-06-03: qty 100
  Filled 2019-06-03 (×4): qty 50
  Filled 2019-06-03: qty 100
  Filled 2019-06-03 (×6): qty 50

## 2019-06-03 MED ORDER — HEPARIN SODIUM (PORCINE) 5000 UNIT/ML IJ SOLN: 5000.0000 [IU] | Freq: Three times a day (TID) | INTRAMUSCULAR | Status: DC

## 2019-06-03 MED ORDER — LOSARTAN POTASSIUM 50 MG PO TABS
100.0000 mg | ORAL_TABLET | Freq: Every day | ORAL | Status: DC
  Administered 2019-06-04 – 2019-06-05 (×2): 100 mg via ORAL
  Filled 2019-06-03 (×2): qty 2

## 2019-06-03 MED ORDER — AMLODIPINE BESYLATE 10 MG PO TABS
10.0000 mg | ORAL_TABLET | Freq: Every day | ORAL | Status: DC
  Administered 2019-06-03 – 2019-06-06 (×4): 10 mg via ORAL
  Filled 2019-06-03 (×5): qty 1

## 2019-06-03 MED ORDER — MINOXIDIL 2.5 MG PO TABS
5.0000 mg | ORAL_TABLET | Freq: Two times a day (BID) | ORAL | Status: DC
  Administered 2019-06-03 – 2019-06-06 (×6): 5 mg via ORAL
  Filled 2019-06-03 (×8): qty 2

## 2019-06-03 MED ORDER — FENTANYL CITRATE (PF) 100 MCG/2ML IJ SOLN
25.0000 ug | INTRAMUSCULAR | Status: DC | PRN
  Administered 2019-06-03 – 2019-06-05 (×7): 50 ug via INTRAVENOUS
  Administered 2019-06-06: 25 ug via INTRAVENOUS
  Administered 2019-06-06: 75 ug via INTRAVENOUS
  Administered 2019-06-06 (×2): 50 ug via INTRAVENOUS
  Administered 2019-06-07 (×5): 100 ug via INTRAVENOUS
  Administered 2019-06-07 (×2): 75 ug via INTRAVENOUS
  Administered 2019-06-08 – 2019-06-09 (×5): 100 ug via INTRAVENOUS
  Administered 2019-06-09: 50 ug via INTRAVENOUS
  Administered 2019-06-09: 100 ug via INTRAVENOUS
  Administered 2019-06-09: 50 ug via INTRAVENOUS
  Administered 2019-06-09 – 2019-06-11 (×7): 100 ug via INTRAVENOUS
  Administered 2019-06-12: 50 ug via INTRAVENOUS
  Administered 2019-06-12: 100 ug via INTRAVENOUS
  Filled 2019-06-03 (×34): qty 2
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
## 2019-06-03 NOTE — Progress Notes (Signed)
Patient now in ICU with a-line and central line Abdominal pain and chest pain are improved however his blood pressure is still elevated to 180 despite multiple IV medications.  He does not have a urgent indication to goto the OR.   PE: Abdomen is soft and much less tender Palpable femoral and pedal pulses Continue to titrate meds to control his SBP with goal of <120. Appreciate CCM assistance   Frank Fuller

## 2019-06-03 NOTE — ED Notes (Signed)
Report given to Alvester Chou, RN on [facility]

## 2019-06-03 NOTE — ED Notes (Signed)
Patient transported to CT 
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
## 2019-06-03 NOTE — Telephone Encounter (Signed)
Patient called and stated he was instructed to "call this office when he need to be re-admitted to the hospital". Per notes patient left AMA on 05/31/2019 Dx. Aortic dissection. He sounds to be in some distress, Short of breath. I instructed him to call 911 to get to the [facility] ER and Dr. Trula Slade is on call.

## 2019-06-03 NOTE — ED Triage Notes (Signed)
Pt with [facility] with chest pain with radiation to the back. He was picked up from home but was previously at dialysis where he left before finishing his last hour of treatment. 2 sl nitro PTA. Pt a/o but is refusing to answer questions. Hx of AAA.

## 2019-06-03 NOTE — Procedures (Signed)
Arterial Catheter Insertion Procedure Note Frank Fuller [id number] [birth year]  Procedure: Insertion of Arterial Catheter  Indications: Blood pressure monitoring and Frequent blood sampling  Procedure Details Consent: Risks of procedure as well as the alternatives and risks of each were explained to the (patient/caregiver).  Consent for procedure obtained. Time Out: Verified patient identification, verified procedure, site/side was marked, verified correct patient position, special equipment/implants available, medications/allergies/relevent history reviewed, required imaging and test results available.  Performed  Maximum sterile technique was used including antiseptics, cap, gloves, gown, hand hygiene, mask and sheet. Skin prep: Iodine solution; local anesthetic administered 20 gauge catheter was inserted into left radial artery using the Seldinger technique. ULTRASOUND GUIDANCE USED: YES Evaluation Blood flow good; BP tracing good. Complications: No apparent complications.  Procedure performed under direct ultrasound guidance for real time vessel cannulation.      Frank Fuller, [place] [facility] Pager: [phone].  If no answer, [phone] 06/03/2019, 9:14 PM

## 2019-06-03 NOTE — Procedures (Signed)
Central Venous Catheter Insertion Procedure Note Reg Uhland [id number] December 08, 1960  Procedure: Insertion of Central Venous Catheter Indications: Assessment of intravascular volume, Drug and/or fluid administration and Frequent blood sampling  Procedure Details Consent: Risks of procedure as well as the alternatives and risks of each were explained to the (patient/caregiver).  Consent for procedure obtained. Time Out: Verified patient identification, verified procedure, site/side was marked, verified correct patient position, special equipment/implants available, medications/allergies/relevent history reviewed, required imaging and test results available.  Performed  Maximum sterile technique was used including antiseptics, cap, gloves, gown, hand hygiene, mask and sheet. Skin prep: Iodine; local anesthetic administered A antimicrobial bonded/coated triple lumen catheter was placed in the left internal jugular vein using the Seldinger technique.  Evaluation Blood flow good Complications: No apparent complications Patient did tolerate procedure well. Chest X-ray ordered to verify placement.  CXR: pending.  Procedure performed under direct ultrasound guidance for real time vessel cannulation.      Montey Hora, [facility] Pager: [phone]  or [phone] 06/03/2019, 8:32 PM

## 2019-06-04 ENCOUNTER — Inpatient Hospital Stay (HOSPITAL_COMMUNITY): Payer: Medicare Other

## 2019-06-04 LAB — BASIC METABOLIC PANEL
Anion gap: 15 (ref 5–15)
Anion gap: 13 (ref 5–15)
BUN: 44 mg/dL — ABNORMAL HIGH (ref 6–20)
BUN: 49 mg/dL — ABNORMAL HIGH (ref 6–20)
CO2: 24 mmol/L (ref 22–32)
CO2: 25 mmol/L (ref 22–32)
Calcium: 9 mg/dL (ref 8.9–10.3)
Calcium: 9 mg/dL (ref 8.9–10.3)
Chloride: 91 mmol/L — ABNORMAL LOW (ref 98–111)
Chloride: 90 mmol/L — ABNORMAL LOW (ref 98–111)
Creatinine, Ser: 10.85 mg/dL — ABNORMAL HIGH (ref 0.61–1.24)
Creatinine, Ser: 11.46 mg/dL — ABNORMAL HIGH (ref 0.61–1.24)
GFR calc Af Amer: 5 mL/min — ABNORMAL LOW (ref >60)
GFR calc Af Amer: 5 mL/min — ABNORMAL LOW (ref >60)
GFR calc non Af Amer: 5 mL/min — ABNORMAL LOW (ref >60)
GFR calc non Af Amer: 4 mL/min — ABNORMAL LOW (ref >60)
Glucose, Bld: 96 mg/dL (ref 70–99)
Glucose, Bld: 97 mg/dL (ref 70–99)
Potassium: 5.9 mmol/L — ABNORMAL HIGH (ref 3.5–5.1)
Potassium: 5.8 mmol/L — ABNORMAL HIGH (ref 3.5–5.1)
Sodium: 130 mmol/L — ABNORMAL LOW (ref 135–145)
Sodium: 128 mmol/L — ABNORMAL LOW (ref 135–145)

## 2019-06-04 LAB — PHOSPHORUS: Phosphorus: 5.3 mg/dL — ABNORMAL HIGH (ref 2.5–4.6)

## 2019-06-04 LAB — CBC
HCT: 32.3 % — ABNORMAL LOW (ref 39.0–52.0)
Hemoglobin: 10.4 g/dL — ABNORMAL LOW (ref 13.0–17.0)
MCH: 31.4 pg (ref 26.0–34.0)
MCHC: 32.2 g/dL (ref 30.0–36.0)
MCV: 97.6 fL (ref 80.0–100.0)
Platelets: 271 10*3/uL (ref 150–400)
RBC: 3.31 MIL/uL — ABNORMAL LOW (ref 4.22–5.81)
RDW: 17.2 % — ABNORMAL HIGH (ref 11.5–15.5)
WBC: 8.1 10*3/uL (ref 4.0–10.5)
nRBC: 0 % (ref 0.0–0.2)

## 2019-06-04 LAB — TROPONIN I (HIGH SENSITIVITY)
Troponin I (High Sensitivity): 49 ng/L — ABNORMAL HIGH (ref <18)
Troponin I (High Sensitivity): 65 ng/L — ABNORMAL HIGH (ref <18)

## 2019-06-04 LAB — MRSA PCR SCREENING: MRSA by PCR: NEGATIVE

## 2019-06-04 LAB — PROTIME-INR
INR: 1.3 — ABNORMAL HIGH (ref 0.8–1.2)
Prothrombin Time: 15.9 seconds — ABNORMAL HIGH (ref 11.4–15.2)

## 2019-06-04 LAB — MAGNESIUM: Magnesium: 1.9 mg/dL (ref 1.7–2.4)

## 2019-06-04 MED ORDER — CLONIDINE HCL 0.3 MG/24HR TD PTWK
0.3000 mg | MEDICATED_PATCH | TRANSDERMAL | Status: DC
  Filled 2019-06-04: qty 1

## 2019-06-04 MED ORDER — HYDROCHLOROTHIAZIDE 25 MG PO TABS
25.0000 mg | ORAL_TABLET | Freq: Two times a day (BID) | ORAL | Status: DC
  Administered 2019-06-04 – 2019-06-06 (×4): 25 mg via ORAL
  Filled 2019-06-04 (×4): qty 1

## 2019-06-04 MED ORDER — SODIUM ZIRCONIUM CYCLOSILICATE 10 G PO PACK
10.0000 g | PACK | Freq: Every day | ORAL | Status: DC
  Administered 2019-06-04 – 2019-06-11 (×6): 10 g via ORAL
  Filled 2019-06-04 (×9): qty 1

## 2019-06-04 NOTE — Progress Notes (Signed)
Pt rec'd from ER via stretcher, moved to bed x 3 assist. Pt A&O x 4, oriented to room and unit. Pt verbalizes understanding of pain scale, reports generalized pain 8/10- see MAR for intervention. Pt agrees to inform staff if pain worsens, or if SOB manifest. Pt acknowledges need to inform staff before getting OOB. Plan of care discussed, pt declines opportunity to ask questions. Dr Trula Slade on unit to see pt, OK'ed diet, deferred to critical care to order- Elink informed. Of note, pt declined to participate in completion of admission navigator.

## 2019-06-04 NOTE — Progress Notes (Signed)
eLink Physician-Brief Progress Note Patient Name: Charon Brandes DOB: [birth year] MRN: [id number]   Date of Service  06/04/2019  HPI/Events of Note  Surgical consult --> medical management of Aortic Dissection. Surgery OKed a diet but did not order.   eICU Interventions  Advance diet to clear liquids as tolerated.      Intervention Category Major Interventions: Other:  Lysle Dingwall 06/04/2019, 12:54 AM

## 2019-06-04 NOTE — Consult Note (Signed)
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 Plan: 1. Type B acute aortic dissection/ chest pain/ hypertensive emergency:per VVS - tentatively planning on OR on Friday if BP controlled.   2. ESRD:usual HD TTS - completed 2:40 of 4:15 treatment yesterday. HD planned for tomorrow AM. No heparin.  Using TDC.  K 5.9 - give lokelma 10g now, check 3pm RFP. AM HD tomorrow planned.  3. Hypertension/volume:BP controlled on hospital discharge but again elevated on presentation and didn't have several dc meds filled = looks like not available at pharm.  Will continue to address during admission. Would like him to have meds in hand at discharge.  4. Anemia:Hemoglobin 10.4, on mircera as an outpatient.  5. Metabolic bone disease:Calcium 9.4. Phos 5.3. Will continue calcitriol. Parsabiv not available inpatient- follow calcium. Continue  auryxia. 6. Nutrition:Renal diet with fluid restrictions recommended  7. Recent MSSA bacteremia/ AVG infection: had infection in AVG and had revision in 5/20, then partial resection/ revision in 6/20, then all AVG material (x2) removed 6/30. S/p 6 weeks of ancef.   [name] 06/04/2019, 9:18 AM

## 2019-06-04 NOTE — Progress Notes (Addendum)
[facility] [place]  Subjective  - Feeling better with decreased pain.   Objective 130/84 79 98.4 F (36.9 C) (Oral) 19 96%  Intake/Output Summary (Last 24 hours) at 06/04/2019 0821 Last data filed at 06/04/2019 0700 Gross per 24 hour  Intake 1242.13 ml  Output -  Net 1242.13 ml    Moving all 4 ext.   Palpable DP, radial pulses B Abdomin soft BP 107/69 < 0000000 since MN systolic   Assessment/Planning: Progressive type B dissection  Plan for continued BP control  He will likely need surgical repair of his dissection during this admission. Will discuss further with my attending Clonidine 0.2, Cleviprex 21 mg/hr, Esmolo 230, Minoxidil, Cozarr QD  BP 107/69 < 0000000 since MN systolic Renal called for HD care  Roxy Horseman 06/04/2019 8:21 AM --   Case discussed with Dr Trula Slade.  Pt currently still needs better BP control.  Has had some compliance issues. Dissection has enlarged.  Will tentatively plan for repair on Friday pending his BP control.  Currently this is suboptimal and the root of the problem.  This will need to be addressed prior to repair of his dissection which is a side effect of his underlying BP issue  Will follow  Ruta Hinds, MD [facility] [place]: [phone] Pager: [phone]  Laboratory Lab Results: Recent Labs    06/03/19 1605 06/04/19 0446  WBC 7.2 8.1  HGB 11.6* 10.4*  HCT 35.4* 32.3*  PLT 260 271   BMET Recent Labs    06/03/19 1605 06/04/19 0446  NA 135 130*  K 4.8 5.9*  CL 93* 91*  CO2 25 24  GLUCOSE 100* 96  BUN 37* 44*  CREATININE 9.40* 10.85*  CALCIUM 9.7 9.0    COAG Lab Results  Component Value Date   INR 1.3 (H) 06/03/2019   INR 1.2 04/23/2019   INR 1.2 04/22/2019   No results found for: PTT
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
## 2019-06-05 DIAGNOSIS — I7102 Dissection of abdominal aorta: Secondary | ICD-10-CM

## 2019-06-05 DIAGNOSIS — N186 End stage renal disease: Secondary | ICD-10-CM

## 2019-06-05 DIAGNOSIS — Z720 Tobacco use: Secondary | ICD-10-CM

## 2019-06-05 LAB — CBC
HCT: 30.4 % — ABNORMAL LOW (ref 39.0–52.0)
Hemoglobin: 10.1 g/dL — ABNORMAL LOW (ref 13.0–17.0)
MCH: 32.3 pg (ref 26.0–34.0)
MCHC: 33.2 g/dL (ref 30.0–36.0)
MCV: 97.1 fL (ref 80.0–100.0)
Platelets: 272 10*3/uL (ref 150–400)
RBC: 3.13 MIL/uL — ABNORMAL LOW (ref 4.22–5.81)
RDW: 17 % — ABNORMAL HIGH (ref 11.5–15.5)
WBC: 7.3 10*3/uL (ref 4.0–10.5)
nRBC: 0 % (ref 0.0–0.2)

## 2019-06-05 LAB — RENAL FUNCTION PANEL
Albumin: 2.5 g/dL — ABNORMAL LOW (ref 3.5–5.0)
Anion gap: 16 — ABNORMAL HIGH (ref 5–15)
BUN: 53 mg/dL — ABNORMAL HIGH (ref 6–20)
CO2: 24 mmol/L (ref 22–32)
Calcium: 9.1 mg/dL (ref 8.9–10.3)
Chloride: 87 mmol/L — ABNORMAL LOW (ref 98–111)
Creatinine, Ser: 12.87 mg/dL — ABNORMAL HIGH (ref 0.61–1.24)
GFR calc Af Amer: 4 mL/min — ABNORMAL LOW (ref >60)
GFR calc non Af Amer: 4 mL/min — ABNORMAL LOW (ref >60)
Glucose, Bld: 113 mg/dL — ABNORMAL HIGH (ref 70–99)
Phosphorus: 6.6 mg/dL — ABNORMAL HIGH (ref 2.5–4.6)
Potassium: 5.3 mmol/L — ABNORMAL HIGH (ref 3.5–5.1)
Sodium: 127 mmol/L — ABNORMAL LOW (ref 135–145)

## 2019-06-05 LAB — BASIC METABOLIC PANEL
Anion gap: 14 (ref 5–15)
BUN: 20 mg/dL (ref 6–20)
CO2: 25 mmol/L (ref 22–32)
Calcium: 8.7 mg/dL — ABNORMAL LOW (ref 8.9–10.3)
Chloride: 93 mmol/L — ABNORMAL LOW (ref 98–111)
Creatinine, Ser: 7.11 mg/dL — ABNORMAL HIGH (ref 0.61–1.24)
GFR calc Af Amer: 9 mL/min — ABNORMAL LOW (ref >60)
GFR calc non Af Amer: 8 mL/min — ABNORMAL LOW (ref >60)
Glucose, Bld: 141 mg/dL — ABNORMAL HIGH (ref 70–99)
Potassium: 4.2 mmol/L (ref 3.5–5.1)
Sodium: 132 mmol/L — ABNORMAL LOW (ref 135–145)

## 2019-06-05 LAB — PROTIME-INR
INR: 1.3 — ABNORMAL HIGH (ref 0.8–1.2)
Prothrombin Time: 16.1 seconds — ABNORMAL HIGH (ref 11.4–15.2)

## 2019-06-05 LAB — GLUCOSE, CAPILLARY: Glucose-Capillary: 135 mg/dL — ABNORMAL HIGH (ref 70–99)

## 2019-06-05 LAB — TRIGLYCERIDES: Triglycerides: 338 mg/dL — ABNORMAL HIGH (ref <150)

## 2019-06-05 MED ORDER — HEPARIN SODIUM (PORCINE) 1000 UNIT/ML IJ SOLN
INTRAMUSCULAR | Status: AC
  Administered 2019-06-05: 3400 [IU] via INTRAVENOUS_CENTRAL
  Filled 2019-06-05: qty 2
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
## 2019-06-05 NOTE — H&P (View-Only) (Signed)
[facility] [place]  Subjective  - Doing well over all with improved pain since admission.   Objective 111/75 80 98.4 F (36.9 C) (Oral) 15 97%  Intake/Output Summary (Last 24 hours) at 06/05/2019 0830 Last data filed at 06/05/2019 0700 Gross per 24 hour  Intake 1368.41 ml  Output -  Net 1368.41 ml   Moving all 4 ext.   Palpable DP, radial pulses B Abdomin soft    Assessment/Planning: Progressive type B dissection   Possible plan to undergo TVAR tomorrow with Dr. Oneida Alar. BP control systolic 123456 on Clonidine patch, Cozarr, Minoxidil, and IV Cleviprex and Esmolol.  With PRN Hydralazine. He is receiving HD today  Roxy Horseman 06/05/2019 8:30 AM -- Still some chest pain but improved Discussed with pt plan for TEVAR tomorrow Risks benefits possible complications including but not limited to bleeding infection paraplegia risk (2 to 5%).  He understands and agrees to proceed.  Ruta Hinds, MD [facility] [place] Office: [phone] Pager: [phone]  Laboratory Lab Results: Recent Labs    06/04/19 0446 06/05/19 0252  WBC 8.1 7.3  HGB 10.4* 10.1*  HCT 32.3* 30.4*  PLT 271 272   BMET Recent Labs    06/04/19 1520 06/05/19 0252  NA 128* 127*  K 5.8* 5.3*  CL 90* 87*  CO2 25 24  GLUCOSE 97 113*  BUN 49* 53*  CREATININE 11.46* 12.87*  CALCIUM 9.0 9.1    COAG Lab Results  Component Value Date   INR 1.3 (H) 06/05/2019   INR 1.3 (H) 06/03/2019   INR 1.2 04/23/2019   No results found for: PTT

## 2019-06-05 NOTE — Anesthesia Preprocedure Evaluation (Addendum)
Anesthesia Evaluation  Patient identified by MRN, date of birth, ID band Patient awake    Reviewed: Allergy & Precautions, NPO status , Patient's Chart, lab work & pertinent test results  History of Anesthesia Complications Negative for: history of anesthetic complications  Airway Mallampati: II  TM Distance: >3 FB Neck ROM: full    Dental  (+) Dental Advisory Given, Edentulous Upper, Poor Dentition   Pulmonary Current Smoker,    Pulmonary exam normal        Cardiovascular hypertension, Pt. on medications Normal cardiovascular exam Rhythm:regular  Aortic Dissection  IMPRESSIONS    1. The left ventricle has normal systolic function, with an ejection fraction of 55-60%. No evidence of left ventricular regional wall motion abnormalities.  2. The right ventricle has normal systolc function. The cavity was normal.  3. Trivial pericardial effusion is present.  4. The aortic valve is tricuspid Mild thickening of the aortic valve. No stenosis of the aortic valve.  5. There is evidence of mild plaque in the descending aorta.  6. Normal LV function; no vegetations.    1. The left ventricle has mildly reduced systolic function, with an ejection fraction of 45-50%. The cavity size was normal. There is moderate asymmetric left ventricular hypertrophy of the septal wall. Left ventricular diastolic function could not be  evaluated due to nondiagnostic images. Left ventrical global hypokinesis without regional wall motion abnormalities.  2. The right ventricle has normal systolic function. The cavity was mildly enlarged. There is no increase in right ventricular wall thickness. Right ventricular systolic pressure could not be assessed.  3. Left atrial size was mildly dilated.  4. Right atrial size was mildly dilated.  5. Trivial pericardial effusion is present.  6. No evidence of mitral valve stenosis.  7. The aortic valve is tricuspid.  Mild thickening of the aortic valve. Moderate calcification of the aortic valve. No stenosis of the aortic valve.  8. The ascending aorta, aortic root and aortic arch are normal in size and structure.  9. The inferior vena cava was dilated in size with >50% respiratory variability.    Neuro/Psych PSYCHIATRIC DISORDERS Depression CVA, No Residual Symptoms    GI/Hepatic (+) Hepatitis -  Endo/Other    Renal/GU ESRF and DialysisRenal disease     Musculoskeletal   Abdominal   Peds  Hematology  (+) Blood dyscrasia, anemia ,   Anesthesia Other Findings   Reproductive/Obstetrics                          Anesthesia Physical  Anesthesia Plan  ASA: IV  Anesthesia Plan: General   Post-op Pain Management:    Induction: Intravenous  PONV Risk Score and Plan: 1 and Ondansetron  Airway Management Planned: Oral ETT  Additional Equipment: Arterial line, Spinal Drain and CVP  Intra-op Plan:   Post-operative Plan: Extubation in OR  Informed Consent: I have reviewed the patients History and Physical, chart, labs and discussed the procedure including the risks, benefits and alternatives for the proposed anesthesia with the patient or authorized representative who has indicated his/her understanding and acceptance.     Dental advisory given  Plan Discussed with: Anesthesiologist  Anesthesia Plan Comments:       Anesthesia Quick Evaluation

## 2019-06-05 NOTE — Progress Notes (Signed)
Dr. Deterdine called at 2335 for patient BP Aline 80/38, cuff BP 94/65(73). Cleviprex has been off since 2314. Patient is A&Ox4. Dr. Deterdine advised to continue to monitor patient for now. Will continue to monitor closely.

## 2019-06-05 NOTE — Progress Notes (Addendum)
[facility] [place]  Subjective  - Doing well over all with improved pain since admission.   Objective 111/75 80 98.4 F (36.9 C) (Oral) 15 97%  Intake/Output Summary (Last 24 hours) at 06/05/2019 0830 Last data filed at 06/05/2019 0700 Gross per 24 hour  Intake 1368.41 ml  Output -  Net 1368.41 ml   Moving all 4 ext.   Palpable DP, radial pulses B Abdomin soft    Assessment/Planning: Progressive type B dissection   Possible plan to undergo TVAR tomorrow with Dr. Oneida Alar. BP control systolic 123456 on Clonidine patch, Cozarr, Minoxidil, and IV Cleviprex and Esmolol.  With PRN Hydralazine. He is receiving HD today  Frank Fuller 06/05/2019 8:30 AM -- Still some chest pain but improved Discussed with pt plan for TEVAR tomorrow Risks benefits possible complications including but not limited to bleeding infection paraplegia risk (2 to 5%).  He understands and agrees to proceed.  Ruta Hinds, MD [facility] [place] Office: [phone] Pager: [phone]  Laboratory Lab Results: Recent Labs    06/04/19 0446 06/05/19 0252  WBC 8.1 7.3  HGB 10.4* 10.1*  HCT 32.3* 30.4*  PLT 271 272   BMET Recent Labs    06/04/19 1520 06/05/19 0252  NA 128* 127*  K 5.8* 5.3*  CL 90* 87*  CO2 25 24  GLUCOSE 97 113*  BUN 49* 53*  CREATININE 11.46* 12.87*  CALCIUM 9.0 9.1    COAG Lab Results  Component Value Date   INR 1.3 (H) 06/05/2019   INR 1.3 (H) 06/03/2019   INR 1.2 04/23/2019   No results found for: PTT

## 2019-06-05 NOTE — Progress Notes (Signed)
EKG CRITICAL VALUE     12 lead EKG performed.  Critical value noted.  jonette, RN notified.   Genia Plants, CCT 06/05/2019 9:19 AM

## 2019-06-05 NOTE — Procedures (Signed)
I was present at this dialysis session. I have reviewed the session itself and made appropriate changes.   Remains on IV BP meds, orals restarted.  BP stable. 2K bath.  Using [facility].  No apparent issues.  For surgery tomorrow, tentative HD 8/15. Hb 10.1.   Filed Weights   06/04/19 0500 06/05/19 0500 06/05/19 0718  Weight: 80.2 kg 89.1 kg 91 kg    Recent Labs  Lab 06/05/19 0252  NA 127*  K 5.3*  CL 87*  CO2 24  GLUCOSE 113*  BUN 53*  CREATININE 12.87*  CALCIUM 9.1  PHOS 6.6*    Recent Labs  Lab 06/03/19 1605 06/04/19 0446 06/05/19 0252  WBC 7.2 8.1 7.3  HGB 11.6* 10.4* 10.1*  HCT 35.4* 32.3* 30.4*  MCV 96.5 97.6 97.1  PLT 260 271 272    Scheduled Meds: . amLODipine  10 mg Oral QHS  . [START ON 06/06/2019] cloNIDine  0.3 mg Transdermal Q Fri  . heparin      . hydrALAZINE  100 mg Oral TID  . hydrochlorothiazide  25 mg Oral BID  . labetalol  300 mg Oral TID  . losartan  100 mg Oral Daily  . minoxidil  5 mg Oral BID  . sodium chloride flush  3 mL Intravenous Once  . sodium zirconium cyclosilicate  10 g Oral Daily   Continuous Infusions: . clevidipine 18 mg/hr (06/05/19 0712)  . esmolol 50 mcg/kg/min (06/05/19 0700)   PRN Meds:.fentaNYL (SUBLIMAZE) injection, hydrALAZINE   [name]  MD 06/05/2019, 9:42 AM

## 2019-06-06 ENCOUNTER — Inpatient Hospital Stay (HOSPITAL_COMMUNITY): Payer: Medicare Other | Admitting: Physician Assistant

## 2019-06-06 ENCOUNTER — Inpatient Hospital Stay (HOSPITAL_COMMUNITY): Payer: Medicare Other

## 2019-06-06 ENCOUNTER — Encounter (HOSPITAL_COMMUNITY): Payer: Self-pay | Admitting: Certified Registered"

## 2019-06-06 ENCOUNTER — Encounter (HOSPITAL_COMMUNITY)
Admission: EM | Disposition: A | Discharge status: Home Health | Payer: Self-pay | Source: Home / Self Care | Attending: Pulmonary Disease

## 2019-06-06 DIAGNOSIS — I7101 Dissection of thoracic aorta: Secondary | ICD-10-CM

## 2019-06-06 DIAGNOSIS — I10 Essential (primary) hypertension: Secondary | ICD-10-CM

## 2019-06-06 HISTORY — PX: THORACIC AORTIC ENDOVASCULAR STENT GRAFT: SHX6112

## 2019-06-06 LAB — RENAL FUNCTION PANEL
Albumin: 2.3 g/dL — ABNORMAL LOW (ref 3.5–5.0)
Albumin: 2.4 g/dL — ABNORMAL LOW (ref 3.5–5.0)
Anion gap: 15 (ref 5–15)
Anion gap: 14 (ref 5–15)
BUN: 31 mg/dL — ABNORMAL HIGH (ref 6–20)
BUN: 34 mg/dL — ABNORMAL HIGH (ref 6–20)
CO2: 24 mmol/L (ref 22–32)
CO2: 22 mmol/L (ref 22–32)
Calcium: 8.8 mg/dL — ABNORMAL LOW (ref 8.9–10.3)
Calcium: 8.8 mg/dL — ABNORMAL LOW (ref 8.9–10.3)
Chloride: 94 mmol/L — ABNORMAL LOW (ref 98–111)
Chloride: 95 mmol/L — ABNORMAL LOW (ref 98–111)
Creatinine, Ser: 8.4 mg/dL — ABNORMAL HIGH (ref 0.61–1.24)
Creatinine, Ser: 9.04 mg/dL — ABNORMAL HIGH (ref 0.61–1.24)
GFR calc Af Amer: 7 mL/min — ABNORMAL LOW (ref >60)
GFR calc Af Amer: 7 mL/min — ABNORMAL LOW (ref >60)
GFR calc non Af Amer: 6 mL/min — ABNORMAL LOW (ref >60)
GFR calc non Af Amer: 6 mL/min — ABNORMAL LOW (ref >60)
Glucose, Bld: 103 mg/dL — ABNORMAL HIGH (ref 70–99)
Glucose, Bld: 149 mg/dL — ABNORMAL HIGH (ref 70–99)
Phosphorus: 5.2 mg/dL — ABNORMAL HIGH (ref 2.5–4.6)
Phosphorus: 6.1 mg/dL — ABNORMAL HIGH (ref 2.5–4.6)
Potassium: 4.5 mmol/L (ref 3.5–5.1)
Potassium: 4.9 mmol/L (ref 3.5–5.1)
Sodium: 133 mmol/L — ABNORMAL LOW (ref 135–145)
Sodium: 131 mmol/L — ABNORMAL LOW (ref 135–145)

## 2019-06-06 LAB — SURGICAL PCR SCREEN
MRSA, PCR: NEGATIVE
Staphylococcus aureus: NEGATIVE

## 2019-06-06 LAB — CBC
HCT: 28.1 % — ABNORMAL LOW (ref 39.0–52.0)
HCT: 28 % — ABNORMAL LOW (ref 39.0–52.0)
Hemoglobin: 9.4 g/dL — ABNORMAL LOW (ref 13.0–17.0)
Hemoglobin: 9.3 g/dL — ABNORMAL LOW (ref 13.0–17.0)
MCH: 31.8 pg (ref 26.0–34.0)
MCH: 32.1 pg (ref 26.0–34.0)
MCHC: 33.5 g/dL (ref 30.0–36.0)
MCHC: 33.2 g/dL (ref 30.0–36.0)
MCV: 94.9 fL (ref 80.0–100.0)
MCV: 96.6 fL (ref 80.0–100.0)
Platelets: 267 10*3/uL (ref 150–400)
Platelets: 271 10*3/uL (ref 150–400)
RBC: 2.96 MIL/uL — ABNORMAL LOW (ref 4.22–5.81)
RBC: 2.9 MIL/uL — ABNORMAL LOW (ref 4.22–5.81)
RDW: 16.5 % — ABNORMAL HIGH (ref 11.5–15.5)
RDW: 16.7 % — ABNORMAL HIGH (ref 11.5–15.5)
WBC: 7.1 10*3/uL (ref 4.0–10.5)
WBC: 10.5 10*3/uL (ref 4.0–10.5)
nRBC: 0 % (ref 0.0–0.2)
nRBC: 0 % (ref 0.0–0.2)

## 2019-06-06 LAB — POCT ACTIVATED CLOTTING TIME
Activated Clotting Time: 219 seconds
Activated Clotting Time: 235 seconds

## 2019-06-06 LAB — PREPARE RBC (CROSSMATCH)

## 2019-06-06 SURGERY — INSERTION, ENDOVASCULAR STENT GRAFT, AORTA, THORACIC
Anesthesia: General | Site: Abdomen

## 2019-06-06 MED ORDER — ONDANSETRON HCL 4 MG/2ML IJ SOLN
INTRAMUSCULAR | Status: AC
  Filled 2019-06-06: qty 2

## 2019-06-06 MED ORDER — PENTAFLUOROPROP-TETRAFLUOROETH EX AERO: 1.0000 "application " | INHALATION_SPRAY | CUTANEOUS | Status: DC | PRN

## 2019-06-06 MED ORDER — 0.9 % SODIUM CHLORIDE (POUR BTL) OPTIME
TOPICAL | Status: DC | PRN
  Administered 2019-06-06: 1000 mL

## 2019-06-06 MED ORDER — NOREPINEPHRINE 4 MG/250ML-% IV SOLN
0.0000 ug/min | INTRAVENOUS | Status: DC
  Filled 2019-06-06: qty 250

## 2019-06-06 MED ORDER — AMLODIPINE BESYLATE 10 MG PO TABS
10.0000 mg | ORAL_TABLET | Freq: Once | ORAL | Status: AC
  Administered 2019-06-06: 10 mg via ORAL

## 2019-06-06 MED ORDER — SODIUM CHLORIDE 0.9 % IV SOLN
INTRAVENOUS | Status: DC | PRN
  Administered 2019-06-06: 500 mL

## 2019-06-06 MED ORDER — PROMETHAZINE HCL 25 MG/ML IJ SOLN: 6.2500 mg | INTRAMUSCULAR | Status: DC | PRN

## 2019-06-06 MED ORDER — LIDOCAINE 2% (20 MG/ML) 5 ML SYRINGE
INTRAMUSCULAR | Status: DC | PRN
  Administered 2019-06-06: 60 mg via INTRAVENOUS

## 2019-06-06 MED ORDER — SODIUM CHLORIDE 0.9 % IV SOLN: 100.0000 mL | INTRAVENOUS | Status: DC | PRN

## 2019-06-06 MED ORDER — PROPOFOL 10 MG/ML IV BOLUS
INTRAVENOUS | Status: DC | PRN
  Administered 2019-06-06: 140 mg via INTRAVENOUS

## 2019-06-06 MED ORDER — PROPOFOL 10 MG/ML IV BOLUS
INTRAVENOUS | Status: AC
  Filled 2019-06-06: qty 20

## 2019-06-06 MED ORDER — ACETAMINOPHEN 500 MG PO TABS
1000.0000 mg | ORAL_TABLET | Freq: Once | ORAL | Status: AC
  Administered 2019-06-06: 1000 mg via ORAL
  Filled 2019-06-06: qty 2

## 2019-06-06 MED ORDER — HEPARIN SODIUM (PORCINE) 1000 UNIT/ML DIALYSIS
1000.0000 [IU] | INTRAMUSCULAR | Status: DC | PRN
  Administered 2019-06-09: 4000 [IU] via INTRAVENOUS_CENTRAL
  Filled 2019-06-06 (×5): qty 1

## 2019-06-06 MED ORDER — LIDOCAINE-PRILOCAINE 2.5-2.5 % EX CREA
1.0000 "application " | TOPICAL_CREAM | CUTANEOUS | Status: DC | PRN
  Filled 2019-06-06: qty 5

## 2019-06-06 MED ORDER — HEPARIN SODIUM (PORCINE) 1000 UNIT/ML IJ SOLN
INTRAMUSCULAR | Status: AC
  Filled 2019-06-06: qty 1

## 2019-06-06 MED ORDER — LIDOCAINE HCL (PF) 1 % IJ SOLN: 5.0000 mL | INTRAMUSCULAR | Status: DC | PRN

## 2019-06-06 MED ORDER — HEPARIN SODIUM (PORCINE) 1000 UNIT/ML IJ SOLN
INTRAMUSCULAR | Status: DC | PRN
  Administered 2019-06-06: 9000 [IU] via INTRAVENOUS
  Administered 2019-06-06: 3000 [IU] via INTRAVENOUS

## 2019-06-06 MED ORDER — PHENYLEPHRINE 40 MCG/ML (10ML) SYRINGE FOR IV PUSH (FOR BLOOD PRESSURE SUPPORT)
PREFILLED_SYRINGE | INTRAVENOUS | Status: AC
  Filled 2019-06-06: qty 10

## 2019-06-06 MED ORDER — PROTAMINE SULFATE 10 MG/ML IV SOLN
INTRAVENOUS | Status: AC
  Filled 2019-06-06: qty 5

## 2019-06-06 MED ORDER — SODIUM CHLORIDE 0.9 % IV SOLN
INTRAVENOUS | Status: DC | PRN
  Administered 2019-06-06: 07:00:00 via INTRAVENOUS

## 2019-06-06 MED ORDER — SODIUM CHLORIDE 0.9 % IV SOLN
INTRAVENOUS | Status: AC
  Filled 2019-06-06: qty 1.2

## 2019-06-06 MED ORDER — MIDAZOLAM HCL 5 MG/5ML IJ SOLN
INTRAMUSCULAR | Status: DC | PRN
  Administered 2019-06-06 (×2): 1 mg via INTRAVENOUS

## 2019-06-06 MED ORDER — GLYCOPYRROLATE 0.2 MG/ML IJ SOLN
INTRAMUSCULAR | Status: DC | PRN
  Administered 2019-06-06: 0.4 mg via INTRAVENOUS

## 2019-06-06 MED ORDER — SODIUM CHLORIDE 0.9 % IV SOLN
INTRAVENOUS | Status: DC | PRN
  Administered 2019-06-06 (×2): via INTRAVENOUS

## 2019-06-06 MED ORDER — CEFAZOLIN SODIUM-DEXTROSE 2-4 GM/100ML-% IV SOLN
INTRAVENOUS | Status: AC
  Filled 2019-06-06: qty 100

## 2019-06-06 MED ORDER — LABETALOL HCL 300 MG PO TABS
300.0000 mg | ORAL_TABLET | Freq: Once | ORAL | Status: AC
  Administered 2019-06-06: 300 mg via ORAL

## 2019-06-06 MED ORDER — FENTANYL CITRATE (PF) 250 MCG/5ML IJ SOLN
INTRAMUSCULAR | Status: AC
  Filled 2019-06-06: qty 5

## 2019-06-06 MED ORDER — FENTANYL CITRATE (PF) 100 MCG/2ML IJ SOLN
INTRAMUSCULAR | Status: DC | PRN
  Administered 2019-06-06: 50 ug via INTRAVENOUS
  Administered 2019-06-06 (×2): 100 ug via INTRAVENOUS

## 2019-06-06 MED ORDER — SUGAMMADEX SODIUM 200 MG/2ML IV SOLN
INTRAVENOUS | Status: DC | PRN
  Administered 2019-06-06: 200 mg via INTRAVENOUS

## 2019-06-06 MED ORDER — ONDANSETRON HCL 4 MG/2ML IJ SOLN
INTRAMUSCULAR | Status: DC | PRN
  Administered 2019-06-06: 4 mg via INTRAVENOUS

## 2019-06-06 MED ORDER — DEXAMETHASONE SODIUM PHOSPHATE 10 MG/ML IJ SOLN
INTRAMUSCULAR | Status: AC
  Filled 2019-06-06: qty 1

## 2019-06-06 MED ORDER — NOREPINEPHRINE BITARTRATE 1 MG/ML IV SOLN
INTRAVENOUS | Status: DC | PRN
  Administered 2019-06-06: 2 ug/min via INTRAVENOUS

## 2019-06-06 MED ORDER — CISATRACURIUM BESYLATE 20 MG/10ML IV SOLN
INTRAVENOUS | Status: AC
  Filled 2019-06-06: qty 20

## 2019-06-06 MED ORDER — LIDOCAINE 2% (20 MG/ML) 5 ML SYRINGE
INTRAMUSCULAR | Status: AC
  Filled 2019-06-06: qty 5

## 2019-06-06 MED ORDER — PROTAMINE SULFATE 10 MG/ML IV SOLN
INTRAVENOUS | Status: DC | PRN
  Administered 2019-06-06 (×2): 30 mg via INTRAVENOUS
  Administered 2019-06-06: 10 mg via INTRAVENOUS
  Administered 2019-06-06: 30 mg via INTRAVENOUS

## 2019-06-06 MED ORDER — IODIXANOL 320 MG/ML IV SOLN
INTRAVENOUS | Status: DC | PRN
  Administered 2019-06-06: 09:00:00 80 mL via INTRA_ARTERIAL

## 2019-06-06 MED ORDER — FENTANYL CITRATE (PF) 100 MCG/2ML IJ SOLN: 25.0000 ug | INTRAMUSCULAR | Status: DC | PRN

## 2019-06-06 MED ORDER — ROCURONIUM BROMIDE 10 MG/ML (PF) SYRINGE
PREFILLED_SYRINGE | INTRAVENOUS | Status: AC
  Filled 2019-06-06: qty 10

## 2019-06-06 MED ORDER — DEXAMETHASONE SODIUM PHOSPHATE 10 MG/ML IJ SOLN
INTRAMUSCULAR | Status: DC | PRN
  Administered 2019-06-06: 8 mg via INTRAVENOUS

## 2019-06-06 MED ORDER — PHENYLEPHRINE 40 MCG/ML (10ML) SYRINGE FOR IV PUSH (FOR BLOOD PRESSURE SUPPORT)
PREFILLED_SYRINGE | INTRAVENOUS | Status: DC | PRN
  Administered 2019-06-06: 80 ug via INTRAVENOUS
  Administered 2019-06-06 (×2): 40 ug via INTRAVENOUS
  Administered 2019-06-06: 80 ug via INTRAVENOUS
  Administered 2019-06-06: 40 ug via INTRAVENOUS

## 2019-06-06 MED ORDER — ALBUMIN HUMAN 5 % IV SOLN
INTRAVENOUS | Status: DC | PRN
  Administered 2019-06-06: 09:00:00 via INTRAVENOUS

## 2019-06-06 MED ORDER — NEOSTIGMINE METHYLSULFATE 10 MG/10ML IV SOLN
INTRAVENOUS | Status: DC | PRN
  Administered 2019-06-06: 5 mg via INTRAVENOUS

## 2019-06-06 MED ORDER — ALTEPLASE 2 MG IJ SOLR: 2.0000 mg | Freq: Once | INTRAMUSCULAR | Status: DC | PRN

## 2019-06-06 MED ORDER — SUGAMMADEX SODIUM 200 MG/2ML IV SOLN
200.0000 mg | INTRAVENOUS | Status: DC
  Filled 2019-06-06: qty 2

## 2019-06-06 MED ORDER — MIDAZOLAM HCL 2 MG/2ML IJ SOLN
INTRAMUSCULAR | Status: AC
  Filled 2019-06-06: qty 2

## 2019-06-06 MED ORDER — CEFAZOLIN SODIUM-DEXTROSE 2-3 GM-%(50ML) IV SOLR
INTRAVENOUS | Status: DC | PRN
  Administered 2019-06-06: 2 g via INTRAVENOUS

## 2019-06-06 MED ORDER — LOSARTAN POTASSIUM 50 MG PO TABS
100.0000 mg | ORAL_TABLET | Freq: Once | ORAL | Status: AC
  Administered 2019-06-06: 100 mg via ORAL
  Filled 2019-06-06: qty 2

## 2019-06-06 MED ORDER — EPHEDRINE SULFATE-NACL 50-0.9 MG/10ML-% IV SOSY
PREFILLED_SYRINGE | INTRAVENOUS | Status: DC | PRN
  Administered 2019-06-06: 5 mg via INTRAVENOUS
  Administered 2019-06-06: 10 mg via INTRAVENOUS

## 2019-06-06 MED ORDER — ROCURONIUM BROMIDE 50 MG/5ML IV SOSY
PREFILLED_SYRINGE | INTRAVENOUS | Status: DC | PRN
  Administered 2019-06-06: 20 mg via INTRAVENOUS
  Administered 2019-06-06: 70 mg via INTRAVENOUS
  Administered 2019-06-06: 30 mg via INTRAVENOUS

## 2019-06-06 MED ORDER — SUCCINYLCHOLINE CHLORIDE 200 MG/10ML IV SOSY
PREFILLED_SYRINGE | INTRAVENOUS | Status: AC
  Filled 2019-06-06: qty 10

## 2019-06-06 MED ORDER — SODIUM CHLORIDE 0.9 % IV SOLN
INTRAVENOUS | Status: DC | PRN
  Administered 2019-06-06: 30 ug/min via INTRAVENOUS

## 2019-06-06 SURGICAL SUPPLY — 49 items
CANISTER SUCT 3000ML PPV (MISCELLANEOUS) ×2 IMPLANT
CANNULA VESSEL 3MM 2 BLNT TIP (CANNULA) IMPLANT
CATH ACCU-VU SIZ PIG 5F 100CM (CATHETERS) ×1 IMPLANT
CATH BEACON 5.038 65CM KMP-01 (CATHETERS) IMPLANT
COVER PROBE W GEL 5X96 (DRAPES) ×1 IMPLANT
COVER WAND RF STERILE (DRAPES) ×2 IMPLANT
DERMABOND ADVANCED (GAUZE/BANDAGES/DRESSINGS) ×1
DERMABOND ADVANCED .7 DNX12 (GAUZE/BANDAGES/DRESSINGS) ×1 IMPLANT
DEVICE CLOSURE PERCLS PRGLD 6F (VASCULAR PRODUCTS) IMPLANT
DRSG TEGADERM 2-3/8X2-3/4 SM (GAUZE/BANDAGES/DRESSINGS) ×2 IMPLANT
DRYSEAL FLEXSHEATH 22FR 33CM (SHEATH) ×1 IMPLANT
ELECT REM PT RETURN 9FT ADLT (ELECTROSURGICAL) ×4
ELECTRODE REM PT RTRN 9FT ADLT (ELECTROSURGICAL) ×2 IMPLANT
GLOVE BIO SURGEON STRL SZ7.5 (GLOVE) ×2 IMPLANT
GOWN STRL REUS W/ TWL LRG LVL3 (GOWN DISPOSABLE) ×3 IMPLANT
GOWN STRL REUS W/TWL LRG LVL3 (GOWN DISPOSABLE) ×3
KIT BASIN OR (CUSTOM PROCEDURE TRAY) ×2 IMPLANT
KIT TURNOVER KIT B (KITS) ×2 IMPLANT
NDL PERC 18GX7CM (NEEDLE) ×1 IMPLANT
NEEDLE PERC 18GX7CM (NEEDLE) ×2 IMPLANT
NS IRRIG 1000ML POUR BTL (IV SOLUTION) ×4 IMPLANT
PACK ENDOVASCULAR (PACKS) ×2 IMPLANT
PAD ARMBOARD 7.5X6 YLW CONV (MISCELLANEOUS) ×4 IMPLANT
PERCLOSE PROGLIDE 6F (VASCULAR PRODUCTS) ×10
SET MICROPUNCTURE 5F STIFF (MISCELLANEOUS) ×1 IMPLANT
SHEATH AVANTI 11CM 8FR (SHEATH) IMPLANT
SHEATH PINNACLE 8F 10CM (SHEATH) ×1 IMPLANT
SPONGE SURGIFOAM ABS GEL 100 (HEMOSTASIS) IMPLANT
STAPLER VISISTAT 35W (STAPLE) IMPLANT
STENT THORAC ACS 34X34X20 (Endovascular Graft) ×2 IMPLANT
STOPCOCK MORSE 400PSI 3WAY (MISCELLANEOUS) ×2 IMPLANT
SUT PROLENE 3 0 SH1 36 (SUTURE) IMPLANT
SUT PROLENE 5 0 C 1 24 (SUTURE) IMPLANT
SUT VIC AB 2-0 SH 27 (SUTURE)
SUT VIC AB 2-0 SH 27XBRD (SUTURE) IMPLANT
SUT VIC AB 3-0 SH 27 (SUTURE)
SUT VIC AB 3-0 SH 27X BRD (SUTURE) IMPLANT
SUT VIC AB 4-0 PS2 18 (SUTURE) ×1 IMPLANT
SUT VICRYL 4-0 PS2 18IN ABS (SUTURE) ×4 IMPLANT
SYR 30ML LL (SYRINGE) IMPLANT
SYR 50ML LL SCALE MARK (SYRINGE) ×2 IMPLANT
TOWEL GREEN STERILE (TOWEL DISPOSABLE) ×2 IMPLANT
TRAY FOLEY MTR SLVR 16FR STAT (SET/KITS/TRAYS/PACK) ×2 IMPLANT
TUBING ART PRESS 72  MALE/FEM (TUBING) ×1
TUBING ART PRESS 72 MALE/FEM (TUBING) IMPLANT
TUBING HIGH PRESSURE 120CM (CONNECTOR) ×1 IMPLANT
TUBING INJECTOR 48 (MISCELLANEOUS) ×1 IMPLANT
WIRE BENTSON .035X145CM (WIRE) ×2 IMPLANT
WIRE STIFF LUNDERQUIST 260CM (WIRE) ×1 IMPLANT

## 2019-06-06 NOTE — Progress Notes (Signed)
[facility] Progress Note   Subjective:   S/p Aortic repair this AM - no particular complaints except post op pain.  No dyspnea, edema  Objective Vitals:   06/06/19 1118 06/06/19 1145 06/06/19 1200 06/06/19 1225  BP: 117/70  (!) 125/59 134/62  Pulse: 78 74 76 75  Resp: 13 13 13    Temp:      TempSrc:      SpO2: 95% 98% 97%   Weight:      Height:       Physical Exam General: tired but nontoxic Heart: RRR Lungs: clear anteriorly Extremities: no edema, radial A line in place Dialysis Access:  [facility] c/d/i  Additional Objective Labs: Basic Metabolic Panel: Recent Labs  Lab 06/05/19 0252 06/05/19 1624 06/06/19 0311 06/06/19 1243  NA 127* 132* 133* 131*  K 5.3* 4.2 4.5 4.9  CL 87* 93* 94* 95*  CO2 24 25 24 22   GLUCOSE 113* 141* 103* 149*  BUN 53* 20 31* 34*  CREATININE 12.87* 7.11* 8.40* 9.04*  CALCIUM 9.1 8.7* 8.8* 8.8*  PHOS 6.6*  --  5.2* 6.1*   Liver Function Tests: Recent Labs  Lab 06/05/19 0252 06/06/19 0311 06/06/19 1243  ALBUMIN 2.5* 2.3* 2.4*   No results for input(s): LIPASE, AMYLASE in the last 168 hours. CBC: Recent Labs  Lab 06/03/19 1605 06/04/19 0446 06/05/19 0252 06/06/19 0311 06/06/19 1243  WBC 7.2 8.1 7.3 7.1 10.5  HGB 11.6* 10.4* 10.1* 9.4* 9.3*  HCT 35.4* 32.3* 30.4* 28.1* 28.0*  MCV 96.5 97.6 97.1 94.9 96.6  PLT 260 271 272 267 271   Blood Culture    Component Value Date/Time   SDES BLOOD RIGHT HAND 04/16/2019 0915   SPECREQUEST  04/16/2019 0915    BOTTLES DRAWN AEROBIC ONLY Blood Culture adequate volume   CULT  04/16/2019 0915    NO GROWTH 5 DAYS Performed at [facility] [address], [place], [place] [address]    REPTSTATUS 04/21/2019 FINAL 04/16/2019 0915    Cardiac Enzymes: No results for input(s): CKTOTAL, CKMB, CKMBINDEX, TROPONINI in the last 168 hours. CBG: Recent Labs  Lab 06/05/19 0826  GLUCAP 135*   Iron Studies: No results for input(s): IRON, TIBC, TRANSFERRIN, FERRITIN in the last  72 hours. @lablastinr3 @ Studies/Results: Hybrid Or Imaging (mc Only)  Result Date: 06/06/2019 There is no interpretation for this exam.  This order is for images obtained during a surgical procedure.  Please See "Surgeries" Tab for more information regarding the procedure.   Medications: . sodium chloride    . sodium chloride    . ceFAZolin    . clevidipine 10 mg/hr (06/06/19 1116)   . amLODipine  10 mg Oral QHS  . cloNIDine  0.3 mg Transdermal Q Fri  . hydrALAZINE  100 mg Oral TID  . hydrochlorothiazide  25 mg Oral BID  . labetalol  300 mg Oral TID  . losartan  100 mg Oral Daily  . losartan  100 mg Oral Once  . minoxidil  5 mg Oral BID  . sodium chloride flush  3 mL Intravenous Once  . sodium zirconium cyclosilicate  10 g Oral Daily    Imaging Results (Last 48 hours)  Dg Chest 2 View  Result Date: 06/03/2019 CLINICAL DATA:  Chest pain and shortness of breath EXAM: CHEST - 2 VIEW COMPARISON:  05/26/2019 FINDINGS: Cardiac shadow is stable. Aortic calcifications are again seen. Changes consistent with the known descending thoracic aortic dissection are again seen similar to that noted  on recent CT of the chest. Dialysis catheter is again noted and stable. No focal infiltrate or sizable effusion is seen. No bony abnormality is noted. IMPRESSION: Stable aortic dissection similar to that seen on recent CT examination. No acute abnormality noted. Electronically Signed   By: Inez Catalina M.D.   On: 06/03/2019 15:20   Dg Chest Port 1 View  Result Date: 06/04/2019 CLINICAL DATA:  Aortic dissection. EXAM: PORTABLE CHEST 1 VIEW COMPARISON:  Chest x-ray from yesterday. FINDINGS: Unchanged tunneled right internal jugular dialysis catheter and left internal jugular central venous catheter. Stable cardiomediastinal silhouette and changes of aortic dissection with medial displacement of the aortic arch atherosclerotic calcifications. No focal consolidation, pleural effusion, or pneumothorax. No  acute osseous abnormality. IMPRESSION: 1. Stable changes of known aortic dissection. Electronically Signed   By: Titus Dubin M.D.   On: 06/04/2019 08:41   Dg Chest Port 1 View  Result Date: 06/03/2019 CLINICAL DATA:  Check central line placement EXAM: PORTABLE CHEST 1 VIEW COMPARISON:  Film from earlier in the same day. FINDINGS: Left jugular central line is now been placed with the catheter tip in the mid superior vena cava. No pneumothorax is seen. Lungs remain clear. Changes of aortic dissection are again seen. Cardiac shadow is stable. IMPRESSION: No pneumothorax following central line placement. Electronically Signed   By: Inez Catalina M.D.   On: 06/03/2019 20:51   Ct Angio Chest/abd/pel For Dissection W And/or W/wo  Result Date: 06/03/2019 CLINICAL DATA:  Known dissection, elevated blood pressure EXAM: CT ANGIOGRAPHY CHEST, ABDOMEN AND PELVIS TECHNIQUE: Multidetector CT imaging through the chest, abdomen and pelvis was performed using the standard protocol during bolus administration of intravenous contrast. Multiplanar reconstructed images and MIPs were obtained and reviewed to evaluate the vascular anatomy. CONTRAST:  14mL OMNIPAQUE IOHEXOL 350 MG/ML SOLN COMPARISON:  Numerous priors most recent angiography May 30, 2019 and May 26, 2019 FINDINGS: CTA CHEST FINDINGS Cardiovascular: Noncontrast CT images demonstrate displaced intimal calcification of the thoracic aortic arch. The aortic root is incompletely evaluated due to cardiac pulsation artifact though there is gross preservation of the sino-tubular junction. Ascending aorta is normal caliber. There is a common origin of the right brachiocephalic and left common carotid. Post-contrast CT angiographic images demonstrate proximal migration of the previously seen dissection now beginning at the ostium of the left subclavian artery and continuing inferiorly towards the aortic hiatus. There is non opacification of the false lumen within  the proximal descending thoracic aorta with progressive narrowing of the true lumen when compared to prior study from May 30, 2019 measuring only 13 x 25 mm in caliber, previously 18 x 28 when measured at a similar level. Conversely there has been increasing aortic caliber now measuring up to 4.5 cm in maximum diameter, previously 4.1 cm. Dissection flap terminus is at the level of the diaphragmatic hiatus with small fenestration. No involvement of the celiac trunk. Mediastinum/Nodes: There is increasing hazy stranding in the region of the mediastinum adjacent the aorta in the region of the ligamentum arteriosum near the [place] stones a aortic dissection. No enlarged mediastinal or axillary lymph nodes. Thyroid gland is unremarkable. Secretions are present in the trachea and proximal airways. The esophagus is patulous and fluid-filled. Lungs/Pleura: Dependent atelectatic changes are seen posteriorly. Mild centrilobular emphysema. Increasing size of a left pleural effusion. Airspace opacities are again seen in the anterior (12/66) and posterolateral right upper lobe (12/90), not significantly changed from prior study. Musculoskeletal: No chest wall mass or suspicious bone lesions  identified. Review of the MIP images confirms the above findings. CTA ABDOMEN AND PELVIS FINDINGS VASCULAR Aorta: Mixture of calcified and noncalcified atheromatous plaque is present within the abdominal aorta. Aneurysmal outpouching of the infrarenal abdominal aortic aneurysm measures up to 3.5 cm in maximal diameter. Standard branching of the abdominal aorta. No dissection involvement of the celiac trunk ostia. Celiac: Calcification in the proximal celiac axis. No dissection propagation occlusion or stenosis. No vasculitis. SMA: Patent without evidence of aneurysm, dissection, vasculitis or significant stenosis. Renals: Both renal arteries are patent without evidence of aneurysm, dissection, vasculitis, fibromuscular dysplasia or  significant stenosis. IMA: Patent without evidence of aneurysm, dissection, vasculitis or significant stenosis. Inflow: Small dissection flap is noted in the proximal left internal iliac artery in the region of irregular atheromatous plaque (10/261) additional dissection flap is present in the proximal right common femoral artery (10/289). Neither is significantly changed from previous exams. Remaining segments in the inflow are heavily diseased but without aneurysm or ectasia or other acute vascular finding. Veins: Major venous structures are unremarkable. Review of the MIP images confirms the above findings. NON-VASCULAR Hepatobiliary: No focal liver abnormality is seen. No gallstones, gallbladder wall thickening, or biliary dilatation. Vicarious extravasation of contrast medium is present within the gallbladder. Pancreas: Unremarkable. No pancreatic ductal dilatation or surrounding inflammatory changes. Spleen: Normal in size without focal abnormality. Adrenals/Urinary Tract: Adrenal glands are unremarkable. Symmetric renal atrophy. No concerning renal lesions. No hydronephrosis. High attenuation material in the bladder likely excreted contrast. Mild bladder wall thickening, likely related to underdistention. Stomach/Bowel: Distal esophagus, stomach and duodenal sweep are unremarkable. No bowel wall thickening or dilatation. No evidence of obstruction. Scattered colonic diverticula without focal pericolonic inflammation to suggest diverticulitis. Lymphatic: No enlarged lymph nodes. Reproductive: The prostate and seminal vesicles are unremarkable. Other: No abdominopelvic free fluid or free gas. No bowel containing hernias. Musculoskeletal: Multilevel degenerative changes are present in the imaged portions of the spine. Findings are maximal at L5-S1. Review of the MIP images confirms the above findings. IMPRESSION: 1. Proximal migration of the previously seen dissection now beginning at the ostium of the left  subclavian artery and continuing inferiorly towards the aortic hiatus. There is non opacification of the false lumen within the proximal descending thoracic aorta with progressive narrowing of the true lumen. Conversely there has been increasing aortic caliber now measuring up to 4.5 cm in maximum diameter, previously 4.1 cm. 2. Increasing mediastinal hazy stranding adjacent the aortic dissection with enlarging left pleural effusion are features highly concerning for pending rupture. Urgent surgical evaluation is warranted. 3. Dissection flap terminus is at the level of the diaphragmatic hiatus with small fenestration. No involvement of the celiac trunk. 4. Unchanged small dissection flap in the proximal left internal iliac and proximal right common femoral arteries. 5. Persistent airspace opacities in the right upper lobe. 6. Vicarious extravasation of contrast within the gallbladder. 7. Symmetric renal atrophy. 8. Emphysema (ICD10-J43.9). 9. Aortic Atherosclerosis (ICD10-I70.0). 10.  Aortic aneurysm NOS (ICD10-I71.9). These results were called by telephone at the time of interpretation on 06/03/2019 at 7:12 pm to Dr. Charlesetta Shanks , who verbally acknowledged these results. Electronically Signed   By: Lovena Le M.D.   On: 06/03/2019 19:16    Dialysis orders:  [facility] 4h 61min 400/1.5 87.5kg 2/2.25 bath P2 RIJ TDC Hep 3000  Parsabiv 2.32mcg IV TIW with HD Calcitriol 1.25 mcg PO Q HD  Assessment/ Plan: 1. Type B acute aortic dissection/ chest pain/ hypertensive emergency:per VVS - s/p repair this  AM  2. ESRD:HD planned for tomorrow AM. No heparin. Using [facility].  3. Hypertension/volume:BP controlled on hospital discharge but again elevated on presentation and didn't have several dc meds filled = looks like not available at pharm.  Will continue to address during admission. Would like him to have meds in hand at discharge.  BP has been controlled on po regimen. 4. Anemia:Hemoglobin 10.4 >  9.3, on mircera as an outpatient.  5. Metabolic bone disease:Calcium 9.4. Phos 5.3>6.1. Will continue calcitriol. Parsabiv not available inpatient- follow calcium. Continue auryxia. 6. Nutrition:Renal diet with fluid restrictions recommended  7. Recent MSSA bacteremia/ AVG infection: had infection in AVG and had revision in 5/20, then partial resection/ revision in 6/20, then all AVG material (x2) removed 6/30. S/p 6 weeks of ancef.    Jannifer Hick MD 06/06/2019, 2:10 PM  [facility] Pager: [phone]

## 2019-06-06 NOTE — Anesthesia Procedure Notes (Signed)
Procedure Name: Intubation Date/Time: 06/06/2019 7:43 AM Performed by: Orlie Dakin, CRNA Pre-anesthesia Checklist: Emergency Drugs available, Patient identified, Suction available and Patient being monitored Patient Re-evaluated:Patient Re-evaluated prior to induction Oxygen Delivery Method: Circle system utilized Preoxygenation: Pre-oxygenation with 100% oxygen Induction Type: IV induction Ventilation: Mask ventilation without difficulty Laryngoscope Size: Miller and 3 Grade View: Grade I Tube type: Oral Tube size: 8.0 mm Number of attempts: 1 Airway Equipment and Method: Stylet Placement Confirmation: ETT inserted through vocal cords under direct vision,  positive ETCO2 and breath sounds checked- equal and bilateral Secured at: 24 cm Tube secured with: Tape Dental Injury: Teeth and Oropharynx as per pre-operative assessment

## 2019-06-06 NOTE — Anesthesia Procedure Notes (Signed)
Spinal Catheter  Patient location during procedure: OR Start time: 06/06/2019 7:36 AM End time: 06/06/2019 7:47 AM Reason for block: at surgeon's request Staffing Anesthesiologist: Duane Boston, MD Performed: anesthesiologist  Preanesthetic Checklist Completed: patient identified, surgical consent, pre-op evaluation, timeout performed, IV checked, risks and benefits discussed and monitors and equipment checked Spinal Block Patient position: left lateral decubitus Prep: DuraPrep Patient monitoring: cardiac monitor, continuous pulse ox and blood pressure Approach: midline Location: L3-4 Injection technique: catheter Needle Needle type: Tuohy  Needle gauge: 14 G. Needle length: 9 cm Catheter type: closed end Catheter size:  22 G. Catheter at skin depth: 14 cm Additional Notes Functioning IV was confirmed and monitors were applied. Sterile prep and drape, including hand hygiene and sterile gloves were used. The patient was positioned and the spine was prepped.  Free flow of clear CSF was obtained.  The stylet was withdrawn, and the needle was carefully withdrawn. The tubing was connected to pressure tubing and a transducer.  A sterile dressing and tape was used to secure the catheter. The patient tolerated the procedure well.

## 2019-06-06 NOTE — Progress Notes (Addendum)
Pt arrived from OR responding to voice, but weak. O2 desat to 69% on 10L simple mask. CRNA Eddie Dibbles at bedside, initiated ambu bag. Sat slowly increased to 94%. Dr Tobias Alexander made aware and at bedside. Oral airway inserted by CRNA. Per MDA, CRNA administered Suggamedex reversal. Sat maintained at 97% on simple mask 6L. Pt more responsive to voice with eye opening.   Lumbar drain on arrival was 63mmhg, opened to drain with goal of pressure less than 10.  Arterial line pressures 122/56(76). Goal MAP 85-90. Cleviprex maintained at 8mg /hr as when he arrived from OR.  1032- Patient now responsive and following commands. Groin site level 0. DP, PT and perineal pulses Dopplered bilaterally.

## 2019-06-06 NOTE — Transfer of Care (Signed)
Immediate Anesthesia Transfer of Care Note  Patient: Frank Fuller  Procedure(s) Performed: THORACIC AORTIC ENDOVASCULAR STENT GRAFT WITH SPINAL DRAIN BY ANESTHESIA (N/A Abdomen)  Patient Location: PACU  Anesthesia Type:General  Level of Consciousness: drowsy  Airway & Oxygen Therapy: Patient Spontanous Breathing and Patient connected to face mask oxygen  Post-op Assessment: Report given to RN and Post -op Vital signs reviewed and stable  Post vital signs: Reviewed and stable  Last Vitals:  Vitals Value Taken Time  BP 159/71 06/06/19 1015  Temp 36.5 C 06/06/19 1015  Pulse 84 06/06/19 1025  Resp 12 06/06/19 1025  SpO2 98 % 06/06/19 1025  Vitals shown include unvalidated device data.  Last Pain:  Vitals:   06/06/19 0315  TempSrc: Oral  PainSc: 0-No pain      Patients Stated Pain Goal: 0 ([id number])  Complications: No apparent anesthesia complications and Patient re-intubated
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
## 2019-06-06 NOTE — Interval H&P Note (Signed)
History and Physical Interval Note:  06/06/2019 7:23 AM  Frank Fuller  has presented today for surgery, with the diagnosis of AORTIC DISSECTION.  The various methods of treatment have been discussed with the patient and family. After consideration of risks, benefits and other options for treatment, the patient has consented to  Procedure(s): THORACIC AORTIC ENDOVASCULAR STENT GRAFT WITH SPINAL DRAIN BY ANESTHESIA (N/A) as a surgical intervention.  The patient's history has been reviewed, patient examined, no change in status, stable for surgery.  I have reviewed the patient's chart and labs.  Questions were answered to the patient's satisfaction.     Ruta Hinds

## 2019-06-06 NOTE — Op Note (Signed)
Procedure: Gore thoracic stent graft repair of type B aortic dissection  Preoperative diagnosis: Type B aortic dissection  Postoperative diagnosis: Same  Anesthesia: General  Assistant: Gerri Lins, PA-C  Operative findings: #1  22 French dry seal sheath right femoral artery  2.  34 x 34 x 20 proximal component, 34 x 34 x 20 distal component with graft extending from the left subclavian to the celiac origin  Operative details: After obtaining form consent, patient taken the operating.  The patient placed in supine position operating table.  After induction of general anesthesia a lumbar drain was placed by the anesthesia team.  Next the patient was prepped and draped in usual sterile fashion from the nipples to the knees.  Ultrasound was used to identify the right common femoral artery and femoral bifurcation.  Introducer needle was used to cannulate the right common femoral artery and an 035 Bentson wire advanced into the abdominal aorta under fluoroscopic guidance.  Next a 7 [place] dilator was placed over the sheath to dilate the tract and 2 pro glide devices were deployed 1 at the 2 o'clock position 1 at the 10 o'clock position.  Next using a pigtail catheter to exchange the guidewire and 035 curved Lunderquist wire was advanced all the way up into the ascending aorta to the aortic valve.  The 22 French dry seal sheath was advanced over the wire upon opening the tract for the sheath the lateral Proglide broke.  Sheath was advanced up into the descending thoracic aorta.  Pigtail catheter was advanced using a buddy wire technique.  Contrast angiogram was obtained to determine the location of the origin of the left subclavian artery.  This was done at a 60 degree LAO projection.  The 34 x 34 x 24 stent graft was then deployed just below the takeoff of the left subclavian artery.  This was confirmed with angiography.  The delivery system was removed and a 34 x 34 x 20 extension was then advanced  through the sheath into the vicinity of the celiac artery.  Contrast angiogram was performed in a 90 degree LAO projection to confirm the origin of the celiac.  The second piece of the device was then deployed.  The delivery system was then removed.  Contrast angiogram was then performed by advancing the pigtail catheter bump back up into the ascending aorta.  This showed good approximation of the stent graft approximately a few millimeters below the origin of the left subclavian.  There was no filling of the dissection plane.  An additional contrast angiogram was performed at the bottom end to confirm that we were just above the origin of the celiac axis and again there was no filling of the dissection plane.  At this point the sheath was removed over the Lunderquist wire and the pro glide was replaced with an additional Proglide in the lateral position.  Bentson wire was advanced through this while I secured down the pro glides.  There was still some oozing.  So 1 additional pro-glide was deployed in AP position.  There was good hemostasis after this.  The patient had been given 12,000 units of heparin during the case.  He was given a total of 100 mg of protamine at the conclusion of the case.  He had dorsalis pedis posterior tibial Doppler signals in the right foot at the end of the case.  Patient tired procedure well no complications.  Instrument sponge and needle counts correct end of the case.  Patient was  taken to recovery in stable condition.  Ruta Hinds, MD [facility] Office: [phone] Pager: [phone]

## 2019-06-06 NOTE — Progress Notes (Addendum)
Patient transferred from 2H16 to pre op bay 34 on 3L [place]. Vital signs stable. Report given to CRNA with all questions answered.

## 2019-06-06 NOTE — Anesthesia Postprocedure Evaluation (Signed)
Anesthesia Post Note  Patient: Frank Fuller  Procedure(s) Performed: THORACIC AORTIC ENDOVASCULAR STENT GRAFT WITH SPINAL DRAIN BY ANESTHESIA (N/A Abdomen)     Patient location during evaluation: PACU Anesthesia Type: General Level of consciousness: sedated Pain management: pain level controlled Vital Signs Assessment: post-procedure vital signs reviewed and stable Respiratory status: spontaneous breathing and respiratory function stable Cardiovascular status: stable Postop Assessment: no apparent nausea or vomiting Anesthetic complications: no    Last Vitals:  Vitals:   06/06/19 1045 06/06/19 1052  BP: (!) 118/59   Pulse: 81 80  Resp: 16 16  Temp:    SpO2: 96% 96%    Last Pain:  Vitals:   06/06/19 1045  TempSrc:   PainSc: Asleep                 Teron Blais DANIEL

## 2019-06-07 DIAGNOSIS — G8222 Paraplegia, incomplete: Secondary | ICD-10-CM

## 2019-06-07 DIAGNOSIS — Z992 Dependence on renal dialysis: Secondary | ICD-10-CM

## 2019-06-07 LAB — RENAL FUNCTION PANEL
Albumin: 2.9 g/dL — ABNORMAL LOW (ref 3.5–5.0)
Anion gap: 19 — ABNORMAL HIGH (ref 5–15)
BUN: 43 mg/dL — ABNORMAL HIGH (ref 6–20)
CO2: 22 mmol/L (ref 22–32)
Calcium: 9.3 mg/dL (ref 8.9–10.3)
Chloride: 93 mmol/L — ABNORMAL LOW (ref 98–111)
Creatinine, Ser: 10.38 mg/dL — ABNORMAL HIGH (ref 0.61–1.24)
GFR calc Af Amer: 6 mL/min — ABNORMAL LOW (ref >60)
GFR calc non Af Amer: 5 mL/min — ABNORMAL LOW (ref >60)
Glucose, Bld: 112 mg/dL — ABNORMAL HIGH (ref 70–99)
Phosphorus: 6.3 mg/dL — ABNORMAL HIGH (ref 2.5–4.6)
Potassium: 5.1 mmol/L (ref 3.5–5.1)
Sodium: 134 mmol/L — ABNORMAL LOW (ref 135–145)

## 2019-06-07 LAB — TRIGLYCERIDES: Triglycerides: 220 mg/dL — ABNORMAL HIGH (ref <150)

## 2019-06-07 LAB — MAGNESIUM: Magnesium: 1.9 mg/dL (ref 1.7–2.4)

## 2019-06-07 LAB — CBC
HCT: 30.5 % — ABNORMAL LOW (ref 39.0–52.0)
Hemoglobin: 10 g/dL — ABNORMAL LOW (ref 13.0–17.0)
MCH: 31.3 pg (ref 26.0–34.0)
MCHC: 32.8 g/dL (ref 30.0–36.0)
MCV: 95.6 fL (ref 80.0–100.0)
Platelets: 363 10*3/uL (ref 150–400)
RBC: 3.19 MIL/uL — ABNORMAL LOW (ref 4.22–5.81)
RDW: 16.6 % — ABNORMAL HIGH (ref 11.5–15.5)
WBC: 13.1 10*3/uL — ABNORMAL HIGH (ref 4.0–10.5)
nRBC: 0 % (ref 0.0–0.2)

## 2019-06-07 MED ORDER — NOREPINEPHRINE 4 MG/250ML-% IV SOLN
INTRAVENOUS | Status: AC
  Filled 2019-06-07: qty 250

## 2019-06-07 MED ORDER — ORAL CARE MOUTH RINSE
15.0000 mL | Freq: Two times a day (BID) | OROMUCOSAL | Status: DC
  Administered 2019-06-09 (×2): 15 mL via OROMUCOSAL

## 2019-06-07 MED ORDER — ESMOLOL HCL-SODIUM CHLORIDE 2000 MG/100ML IV SOLN
25.0000 ug/kg/min | INTRAVENOUS | Status: DC
  Administered 2019-06-08: 25 ug/kg/min via INTRAVENOUS
  Administered 2019-06-08: 100 ug/kg/min via INTRAVENOUS
  Administered 2019-06-09: 125 ug/kg/min via INTRAVENOUS
  Administered 2019-06-09: 200 ug/kg/min via INTRAVENOUS
  Administered 2019-06-09: 05:00:00 75 ug/kg/min via INTRAVENOUS
  Administered 2019-06-09: 01:00:00 100 ug/kg/min via INTRAVENOUS
  Administered 2019-06-09: 11:00:00 150 ug/kg/min via INTRAVENOUS
  Filled 2019-06-07 (×8): qty 100

## 2019-06-07 MED ORDER — NOREPINEPHRINE 4 MG/250ML-% IV SOLN
2.0000 ug/min | INTRAVENOUS | Status: DC
  Administered 2019-06-07: 08:00:00 15 ug/min via INTRAVENOUS

## 2019-06-07 MED ORDER — NOREPINEPHRINE 16 MG/250ML-% IV SOLN
2.0000 ug/min | INTRAVENOUS | Status: DC
  Administered 2019-06-07: 6 ug/min via INTRAVENOUS
  Filled 2019-06-07: qty 250

## 2019-06-07 MED ORDER — OXYCODONE-ACETAMINOPHEN 5-325 MG PO TABS
1.0000 | ORAL_TABLET | ORAL | Status: DC | PRN
  Administered 2019-06-07 – 2019-06-11 (×8): 2 via ORAL
  Filled 2019-06-07 (×9): qty 2

## 2019-06-07 NOTE — Progress Notes (Addendum)
  [facility]  Subjective  - Pt became hypotensive around 630 am today.  Around the same time he began to experience weakness in legs.  Have been draining CSF around 10 cc/hr all night with pressures 20-30.  Complaining of back pain over the course of the evening   Objective (!) 153/79 96 98.9 F (37.2 C) (Oral) (!) 25 94%  Intake/Output Summary (Last 24 hours) at 06/07/2019 [id number] Last data filed at 06/07/2019 [id number] Gross per 24 hour  Intake 1967.78 ml  Output 350 ml  Net 1617.78 ml   2+ DP pulses bilaterally Abdomen soft non tender Right groin no hematoma Currently on hemodialysis  Assessment/Planning: S/p TEVAR for dissection, now with paraplegia from mid to lower thoracic level Will keep MAP > 90 today try to keep MAP less than 100 Continue to drain CSF up to 10 ml/hr Discussed findings with pt who is obvious anxious regarding this Hopefully will improve with perfusion Gave 1.5 liters fluid this morning may need more fluid during the day to keep volume high for now to improve spinal cord perfusion  Hold amlodipine hydralazine claviprex hydrochlorothiazide minoxidil for now continue labetalol losartan clonidine patch.  If needs control for mean > 100 will use short acting IV meds like esmolol or nipride  Ruta Hinds 06/07/2019 8:07 AM --  Laboratory Lab Results: Recent Labs    06/06/19 1243 06/07/19 0333  WBC 10.5 13.1*  HGB 9.3* 10.0*  HCT 28.0* 30.5*  PLT 271 363   BMET Recent Labs    06/06/19 1243 06/07/19 0333  NA 131* 134*  K 4.9 5.1  CL 95* 93*  CO2 22 22  GLUCOSE 149* 112*  BUN 34* 43*  CREATININE 9.04* 10.38*  CALCIUM 8.8* 9.3    COAG Lab Results  Component Value Date   INR 1.3 (H) 06/05/2019   INR 1.3 (H) 06/03/2019   INR 1.2 04/23/2019   No results found for: PTT
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
## 2019-06-07 NOTE — Progress Notes (Signed)
MD made aware of bleeding around lumbar site. Also, a increased need for oxygen. Anesthesia contacted and MD assessed the area. Dressing changed by anesthesiologist. Will continue to monitor the site and pt's status. Pt is resting and calm at the moment.

## 2019-06-07 NOTE — Addendum Note (Signed)
Addendum  created 06/07/19 1157 by Roderic Palau, MD   Clinical Note Signed

## 2019-06-07 NOTE — Progress Notes (Signed)
Pt continues to complain of pain. Pt also complains of lower extremity numbness. Md notified, orders for fluids and continued B/P management

## 2019-06-07 NOTE — Progress Notes (Signed)
[facility] Progress Note   Subjective:   Paraplegia early AM thought related to CSF drain + hypotension, multiple meds held + fluid bolus and symptoms improved with higher MAPs.  Finishing up HD now - 0 UF in light of acute issues.  No dialysis related issues.   Objective Vitals:   06/07/19 1015 06/07/19 1030 06/07/19 1045 06/07/19 1100  BP: (!) 170/65 (!) 187/72 (!) 164/71 (!) 161/70  Pulse: 90 91 85 96  Resp:      Temp:      TempSrc:      SpO2:      Weight:      Height:       Physical Exam General: tired but nontoxic Heart: RRR Lungs: clear anteriorly Extremities: no edema Dialysis Access:  [facility] c/d/i  Additional Objective Labs: Basic Metabolic Panel: Recent Labs  Lab 06/06/19 0311 06/06/19 1243 06/07/19 0333  NA 133* 131* 134*  K 4.5 4.9 5.1  CL 94* 95* 93*  CO2 24 22 22   GLUCOSE 103* 149* 112*  BUN 31* 34* 43*  CREATININE 8.40* 9.04* 10.38*  CALCIUM 8.8* 8.8* 9.3  PHOS 5.2* 6.1* 6.3*   Liver Function Tests: Recent Labs  Lab 06/06/19 0311 06/06/19 1243 06/07/19 0333  ALBUMIN 2.3* 2.4* 2.9*   No results for input(s): LIPASE, AMYLASE in the last 168 hours. CBC: Recent Labs  Lab 06/04/19 0446 06/05/19 0252 06/06/19 0311 06/06/19 1243 06/07/19 0333  WBC 8.1 7.3 7.1 10.5 13.1*  HGB 10.4* 10.1* 9.4* 9.3* 10.0*  HCT 32.3* 30.4* 28.1* 28.0* 30.5*  MCV 97.6 97.1 94.9 96.6 95.6  PLT 271 272 267 271 363   Blood Culture    Component Value Date/Time   SDES BLOOD RIGHT HAND 04/16/2019 0915   SPECREQUEST  04/16/2019 0915    BOTTLES DRAWN AEROBIC ONLY Blood Culture adequate volume   CULT  04/16/2019 0915    NO GROWTH 5 DAYS Performed at [facility], [place] [address], [place], [place] [address]    REPTSTATUS 04/21/2019 FINAL 04/16/2019 0915    Cardiac Enzymes: No results for input(s): CKTOTAL, CKMB, CKMBINDEX, TROPONINI in the last 168 hours. CBG: Recent Labs  Lab 06/05/19 0826  GLUCAP 135*   Iron Studies: No results for  input(s): IRON, TIBC, TRANSFERRIN, FERRITIN in the last 72 hours. @lablastinr3 @ Studies/Results: Hybrid Or Imaging (mc Only)  Result Date: 06/06/2019 There is no interpretation for this exam.  This order is for images obtained during a surgical procedure.  Please See "Surgeries" Tab for more information regarding the procedure.   Medications: . sodium chloride    . sodium chloride    . norepinephrine    . norepinephrine (LEVOPHED) Adult infusion 15 mcg/min (06/07/19 0815)   . sodium chloride flush  3 mL Intravenous Once  . sodium zirconium cyclosilicate  10 g Oral Daily    Imaging Results (Last 48 hours)  Dg Chest 2 View  Result Date: 06/03/2019 CLINICAL DATA:  Chest pain and shortness of breath EXAM: CHEST - 2 VIEW COMPARISON:  05/26/2019 FINDINGS: Cardiac shadow is stable. Aortic calcifications are again seen. Changes consistent with the known descending thoracic aortic dissection are again seen similar to that noted on recent CT of the chest. Dialysis catheter is again noted and stable. No focal infiltrate or sizable effusion is seen. No bony abnormality is noted. IMPRESSION: Stable aortic dissection similar to that seen on recent CT examination. No acute abnormality noted. Electronically Signed   By: Linus Mako.D.  On: 06/03/2019 15:20   Dg Chest Port 1 View  Result Date: 06/04/2019 CLINICAL DATA:  Aortic dissection. EXAM: PORTABLE CHEST 1 VIEW COMPARISON:  Chest x-ray from yesterday. FINDINGS: Unchanged tunneled right internal jugular dialysis catheter and left internal jugular central venous catheter. Stable cardiomediastinal silhouette and changes of aortic dissection with medial displacement of the aortic arch atherosclerotic calcifications. No focal consolidation, pleural effusion, or pneumothorax. No acute osseous abnormality. IMPRESSION: 1. Stable changes of known aortic dissection. Electronically Signed   By: Titus Dubin M.D.   On: 06/04/2019 08:41   Dg Chest Port 1  View  Result Date: 06/03/2019 CLINICAL DATA:  Check central line placement EXAM: PORTABLE CHEST 1 VIEW COMPARISON:  Film from earlier in the same day. FINDINGS: Left jugular central line is now been placed with the catheter tip in the mid superior vena cava. No pneumothorax is seen. Lungs remain clear. Changes of aortic dissection are again seen. Cardiac shadow is stable. IMPRESSION: No pneumothorax following central line placement. Electronically Signed   By: Inez Catalina M.D.   On: 06/03/2019 20:51   Ct Angio Chest/abd/pel For Dissection W And/or W/wo  Result Date: 06/03/2019 CLINICAL DATA:  Known dissection, elevated blood pressure EXAM: CT ANGIOGRAPHY CHEST, ABDOMEN AND PELVIS TECHNIQUE: Multidetector CT imaging through the chest, abdomen and pelvis was performed using the standard protocol during bolus administration of intravenous contrast. Multiplanar reconstructed images and MIPs were obtained and reviewed to evaluate the vascular anatomy. CONTRAST:  130mL OMNIPAQUE IOHEXOL 350 MG/ML SOLN COMPARISON:  Numerous priors most recent angiography May 30, 2019 and May 26, 2019 FINDINGS: CTA CHEST FINDINGS Cardiovascular: Noncontrast CT images demonstrate displaced intimal calcification of the thoracic aortic arch. The aortic root is incompletely evaluated due to cardiac pulsation artifact though there is gross preservation of the sino-tubular junction. Ascending aorta is normal caliber. There is a common origin of the right brachiocephalic and left common carotid. Post-contrast CT angiographic images demonstrate proximal migration of the previously seen dissection now beginning at the ostium of the left subclavian artery and continuing inferiorly towards the aortic hiatus. There is non opacification of the false lumen within the proximal descending thoracic aorta with progressive narrowing of the true lumen when compared to prior study from May 30, 2019 measuring only 13 x 25 mm in caliber,  previously 18 x 28 when measured at a similar level. Conversely there has been increasing aortic caliber now measuring up to 4.5 cm in maximum diameter, previously 4.1 cm. Dissection flap terminus is at the level of the diaphragmatic hiatus with small fenestration. No involvement of the celiac trunk. Mediastinum/Nodes: There is increasing hazy stranding in the region of the mediastinum adjacent the aorta in the region of the ligamentum arteriosum near the [place] stones a aortic dissection. No enlarged mediastinal or axillary lymph nodes. Thyroid gland is unremarkable. Secretions are present in the trachea and proximal airways. The esophagus is patulous and fluid-filled. Lungs/Pleura: Dependent atelectatic changes are seen posteriorly. Mild centrilobular emphysema. Increasing size of a left pleural effusion. Airspace opacities are again seen in the anterior (12/66) and posterolateral right upper lobe (12/90), not significantly changed from prior study. Musculoskeletal: No chest wall mass or suspicious bone lesions identified. Review of the MIP images confirms the above findings. CTA ABDOMEN AND PELVIS FINDINGS VASCULAR Aorta: Mixture of calcified and noncalcified atheromatous plaque is present within the abdominal aorta. Aneurysmal outpouching of the infrarenal abdominal aortic aneurysm measures up to 3.5 cm in maximal diameter. Standard branching of the abdominal aorta. No  dissection involvement of the celiac trunk ostia. Celiac: Calcification in the proximal celiac axis. No dissection propagation occlusion or stenosis. No vasculitis. SMA: Patent without evidence of aneurysm, dissection, vasculitis or significant stenosis. Renals: Both renal arteries are patent without evidence of aneurysm, dissection, vasculitis, fibromuscular dysplasia or significant stenosis. IMA: Patent without evidence of aneurysm, dissection, vasculitis or significant stenosis. Inflow: Small dissection flap is noted in the proximal left  internal iliac artery in the region of irregular atheromatous plaque (10/261) additional dissection flap is present in the proximal right common femoral artery (10/289). Neither is significantly changed from previous exams. Remaining segments in the inflow are heavily diseased but without aneurysm or ectasia or other acute vascular finding. Veins: Major venous structures are unremarkable. Review of the MIP images confirms the above findings. NON-VASCULAR Hepatobiliary: No focal liver abnormality is seen. No gallstones, gallbladder wall thickening, or biliary dilatation. Vicarious extravasation of contrast medium is present within the gallbladder. Pancreas: Unremarkable. No pancreatic ductal dilatation or surrounding inflammatory changes. Spleen: Normal in size without focal abnormality. Adrenals/Urinary Tract: Adrenal glands are unremarkable. Symmetric renal atrophy. No concerning renal lesions. No hydronephrosis. High attenuation material in the bladder likely excreted contrast. Mild bladder wall thickening, likely related to underdistention. Stomach/Bowel: Distal esophagus, stomach and duodenal sweep are unremarkable. No bowel wall thickening or dilatation. No evidence of obstruction. Scattered colonic diverticula without focal pericolonic inflammation to suggest diverticulitis. Lymphatic: No enlarged lymph nodes. Reproductive: The prostate and seminal vesicles are unremarkable. Other: No abdominopelvic free fluid or free gas. No bowel containing hernias. Musculoskeletal: Multilevel degenerative changes are present in the imaged portions of the spine. Findings are maximal at L5-S1. Review of the MIP images confirms the above findings. IMPRESSION: 1. Proximal migration of the previously seen dissection now beginning at the ostium of the left subclavian artery and continuing inferiorly towards the aortic hiatus. There is non opacification of the false lumen within the proximal descending thoracic aorta with  progressive narrowing of the true lumen. Conversely there has been increasing aortic caliber now measuring up to 4.5 cm in maximum diameter, previously 4.1 cm. 2. Increasing mediastinal hazy stranding adjacent the aortic dissection with enlarging left pleural effusion are features highly concerning for pending rupture. Urgent surgical evaluation is warranted. 3. Dissection flap terminus is at the level of the diaphragmatic hiatus with small fenestration. No involvement of the celiac trunk. 4. Unchanged small dissection flap in the proximal left internal iliac and proximal right common femoral arteries. 5. Persistent airspace opacities in the right upper lobe. 6. Vicarious extravasation of contrast within the gallbladder. 7. Symmetric renal atrophy. 8. Emphysema (ICD10-J43.9). 9. Aortic Atherosclerosis (ICD10-I70.0). 10.  Aortic aneurysm NOS (ICD10-I71.9). These results were called by telephone at the time of interpretation on 06/03/2019 at 7:12 pm to Dr. Charlesetta Shanks , who verbally acknowledged these results. Electronically Signed   By: Lovena Le M.D.   On: 06/03/2019 19:16    Dialysis orders:  [facility] 4h 39min 400/1.5 87.5kg 2/2.25 bath P2 RIJ TDC Hep 3000  Parsabiv 2.67mcg IV TIW with HD Calcitriol 1.25 mcg PO Q HD  Assessment/ Plan: 1. Type B acute aortic dissection/ chest pain/ hypertensive emergency:per VVS - s/p repair 8/14  2. ESRD:HD per TTS schedule.  No UF today in light of acute issues with MAPs.  I think he will be ok until Tuesday but will keep close eye on volume status. No heparin. Using [facility].  3. Hypertension/volume:BP controlled on hospital discharge but again elevated on presentation and didn't have  several dc meds filled = looks like not available at pharm.  Will continue to address during admission. Would like him to have meds in hand at discharge.  BP has been controlled on po regimen but part of regimen on hold this AM in light of hypotension->paraplegia with CSF  drain.   4. Anemia:Hemoglobin 10, on mircera as an outpatient.  5. Metabolic bone disease:Calcium 9.4. Phos 6.3. Will continue calcitriol. Parsabiv not available inpatient- follow calcium. Continue auryxia. 6. Nutrition:Renal diet with fluid restrictions recommended  7. Recent MSSA bacteremia/ AVG infection: had infection in AVG and had revision in 5/20, then partial resection/ revision in 6/20, then all AVG material (x2) removed 6/30. S/p 6 weeks of ancef.    Jannifer Hick MD 06/07/2019, 11:30 AM  [facility] Pager: [phone]

## 2019-06-07 NOTE — Consult Note (Signed)
Called to assess bleeding at the lumbar drain site. There was some blood around the catheter site on inspection. The dressing was removed and cleaned. Most of the existing blood was actually clot. The area was cleaned and a new dressing applied. Will continue to follow.

## 2019-06-07 NOTE — Addendum Note (Signed)
Addendum  created 06/07/19 2224 by Roderic Palau, MD   Clinical Note Signed

## 2019-06-07 NOTE — Anesthesia Post-op Follow-up Note (Signed)
  Lumbar Drain Follow-up Note  Patient: Frank Fuller  Day #: 1  Date of Follow-up: 06/07/2019 Time: 11:54 AM  Last Vitals:  Vitals:   06/07/19 1100 06/07/19 1128  BP: (!) 161/70 (!) 199/69  Pulse: 96 82  Resp:  20  Temp:  36.4 C  SpO2:  96%    Level of Consciousness: alert  Pain: none   Side Effects:None  Catheter Site Exam:clean, dry, no drainage  Anti-Coag Meds (From admission, onward)   Start     Dose/Rate Route Frequency Ordered Stop   06/06/19 1156  heparin injection 1,000 Units     1,000 Units Dialysis As needed 06/06/19 1156         Plan: Catheter draining well. Will continue to follow and remove at surgeons request.  Ezekiah Massie,W. EDMOND

## 2019-06-07 NOTE — Progress Notes (Signed)
Pt with continued c/o of back pain. Lumbar pressures remain 20-30, 10 cc/hr drained  Blood pressures per flow sheet. Pt requiring frequent doses of pain medicine. Lower extremities with full sensation and movement. Pt denies Headaches. Dr fields notified.

## 2019-06-08 DIAGNOSIS — G8220 Paraplegia, unspecified: Secondary | ICD-10-CM

## 2019-06-08 LAB — BASIC METABOLIC PANEL
Anion gap: 14 (ref 5–15)
BUN: 26 mg/dL — ABNORMAL HIGH (ref 6–20)
CO2: 25 mmol/L (ref 22–32)
Calcium: 9.2 mg/dL (ref 8.9–10.3)
Chloride: 96 mmol/L — ABNORMAL LOW (ref 98–111)
Creatinine, Ser: 7.17 mg/dL — ABNORMAL HIGH (ref 0.61–1.24)
GFR calc Af Amer: 9 mL/min — ABNORMAL LOW (ref >60)
GFR calc non Af Amer: 8 mL/min — ABNORMAL LOW (ref >60)
Glucose, Bld: 104 mg/dL — ABNORMAL HIGH (ref 70–99)
Potassium: 4.2 mmol/L (ref 3.5–5.1)
Sodium: 135 mmol/L (ref 135–145)

## 2019-06-08 LAB — CBC
HCT: 27.6 % — ABNORMAL LOW (ref 39.0–52.0)
Hemoglobin: 8.8 g/dL — ABNORMAL LOW (ref 13.0–17.0)
MCH: 31.4 pg (ref 26.0–34.0)
MCHC: 31.9 g/dL (ref 30.0–36.0)
MCV: 98.6 fL (ref 80.0–100.0)
Platelets: 299 10*3/uL (ref 150–400)
RBC: 2.8 MIL/uL — ABNORMAL LOW (ref 4.22–5.81)
RDW: 17 % — ABNORMAL HIGH (ref 11.5–15.5)
WBC: 15.6 10*3/uL — ABNORMAL HIGH (ref 4.0–10.5)
nRBC: 0 % (ref 0.0–0.2)

## 2019-06-08 NOTE — Progress Notes (Signed)
[facility]  Subjective  - legs almost back to baseline   Objective (!) 166/89 85 99.3 F (37.4 C) (Oral) 20 100%  Intake/Output Summary (Last 24 hours) at 06/08/2019 [id number] Last data filed at 06/08/2019 0800 Gross per 24 hour  Intake 457.45 ml  Output -1104 ml  Net 1561.45 ml   Lumbar drain still about 10 cc/hr Abdomen soft no groin hematoma  Assessment/Planning: S/p TEVAR for dissection Brief episode parplegia yesterday seems to be resolving Will remove lumbar drain tomorrow Keep MAP >90 On Esmolol drip will defer PO med titration to critical care and nephrology but would have fairly permissive range currently tolerating as high as [id number] systolic and certainly not dropping him below [id number] systolic  Ruta Hinds [id number] 9:39 AM --  Laboratory Lab Results: Recent Labs    06/07/19 0333 06/08/19 0511  WBC 13.1* 15.6*  HGB 10.0* 8.8*  HCT 30.5* 27.6*  PLT 363 299   BMET Recent Labs    06/07/19 0333 06/08/19 0511  NA 134* 135  K 5.1 4.2  CL 93* 96*  CO2 22 25  GLUCOSE 112* 104*  BUN 43* 26*  CREATININE 10.38* 7.17*  CALCIUM 9.3 9.2    COAG Lab Results  Component Value Date   INR 1.3 (H) 06/05/2019   INR 1.3 (H) 06/03/2019   INR 1.2 04/23/2019   No results found for: PTT
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
## 2019-06-08 NOTE — Progress Notes (Signed)
[facility] Progress Note   Subjective:   Paraplegia early AM thought related to CSF drain + hypotension, po meds held and actually required NE to maintain appropriate MAPS 90-100--> legs nearly back to baseline.  NE currently paused but was requiring until this AM.   Objective Vitals:   06/08/19 0715 06/08/19 0730 06/08/19 0745 06/08/19 0800  BP:      Pulse: (!) 113 (!) 110 (!) 108 85  Resp: (!) 30 (!) 24 18 20   Temp:    99.3 F (37.4 C)  TempSrc:    Oral  SpO2: 100% 100% 100% 100%  Weight:      Height:       Physical Exam General: tired but nontoxic Heart: RRR Lungs: clear anteriorly Extremities: trace LEedema Dialysis Access:  [facility] c/d/i Neuro: conversant, freely moving LE   Additional Objective Labs: Basic Metabolic Panel: Recent Labs  Lab 06/06/19 0311 06/06/19 1243 06/07/19 0333 06/08/19 0511  NA 133* 131* 134* 135  K 4.5 4.9 5.1 4.2  CL 94* 95* 93* 96*  CO2 24 22 22 25   GLUCOSE 103* 149* 112* 104*  BUN 31* 34* 43* 26*  CREATININE 8.40* 9.04* 10.38* 7.17*  CALCIUM 8.8* 8.8* 9.3 9.2  PHOS 5.2* 6.1* 6.3*  --    Liver Function Tests: Recent Labs  Lab 06/06/19 0311 06/06/19 1243 06/07/19 0333  ALBUMIN 2.3* 2.4* 2.9*   No results for input(s): LIPASE, AMYLASE in the last 168 hours. CBC: Recent Labs  Lab 06/05/19 0252 06/06/19 0311 06/06/19 1243 06/07/19 0333 06/08/19 0511  WBC 7.3 7.1 10.5 13.1* 15.6*  HGB 10.1* 9.4* 9.3* 10.0* 8.8*  HCT 30.4* 28.1* 28.0* 30.5* 27.6*  MCV 97.1 94.9 96.6 95.6 98.6  PLT 272 267 271 363 299   Blood Culture    Component Value Date/Time   SDES BLOOD RIGHT HAND 04/16/2019 0915   SPECREQUEST  04/16/2019 0915    BOTTLES DRAWN AEROBIC ONLY Blood Culture adequate volume   CULT  04/16/2019 0915    NO GROWTH 5 DAYS Performed at [facility], [place] [address] [place], [place] [address]    REPTSTATUS 04/21/2019 FINAL 04/16/2019 0915    Cardiac Enzymes: No results for input(s): CKTOTAL, CKMB,  CKMBINDEX, TROPONINI in the last 168 hours. CBG: Recent Labs  Lab 06/05/19 0826  GLUCAP 135*   Iron Studies: No results for input(s): IRON, TIBC, TRANSFERRIN, FERRITIN in the last 72 hours. @lablastinr3 @ Studies/Results: No results found. Medications: . sodium chloride    . sodium chloride    . esmolol    . norepinephrine (LEVOPHED) Adult infusion Stopped (06/07/19 1541)   . mouth rinse  15 mL Mouth Rinse BID  . sodium chloride flush  3 mL Intravenous Once  . sodium zirconium cyclosilicate  10 g Oral Daily    Imaging Results (Last 48 hours)  Dg Chest 2 View  Result Date: 06/03/2019 CLINICAL DATA:  Chest pain and shortness of breath EXAM: CHEST - 2 VIEW COMPARISON:  05/26/2019 FINDINGS: Cardiac shadow is stable. Aortic calcifications are again seen. Changes consistent with the known descending thoracic aortic dissection are again seen similar to that noted on recent CT of the chest. Dialysis catheter is again noted and stable. No focal infiltrate or sizable effusion is seen. No bony abnormality is noted. IMPRESSION: Stable aortic dissection similar to that seen on recent CT examination. No acute abnormality noted. Electronically Signed   By: Inez Catalina M.D.   On: 06/03/2019 15:20   Dg Chest [facility]  1 View  Result Date: 06/04/2019 CLINICAL DATA:  Aortic dissection. EXAM: PORTABLE CHEST 1 VIEW COMPARISON:  Chest x-ray from yesterday. FINDINGS: Unchanged tunneled right internal jugular dialysis catheter and left internal jugular central venous catheter. Stable cardiomediastinal silhouette and changes of aortic dissection with medial displacement of the aortic arch atherosclerotic calcifications. No focal consolidation, pleural effusion, or pneumothorax. No acute osseous abnormality. IMPRESSION: 1. Stable changes of known aortic dissection. Electronically Signed   By: Titus Dubin M.D.   On: 06/04/2019 08:41   Dg Chest Port 1 View  Result Date: 06/03/2019 CLINICAL DATA:  Check  central line placement EXAM: PORTABLE CHEST 1 VIEW COMPARISON:  Film from earlier in the same day. FINDINGS: Left jugular central line is now been placed with the catheter tip in the mid superior vena cava. No pneumothorax is seen. Lungs remain clear. Changes of aortic dissection are again seen. Cardiac shadow is stable. IMPRESSION: No pneumothorax following central line placement. Electronically Signed   By: Inez Catalina M.D.   On: 06/03/2019 20:51   Ct Angio Chest/abd/pel For Dissection W And/or W/wo  Result Date: 06/03/2019 CLINICAL DATA:  Known dissection, elevated blood pressure EXAM: CT ANGIOGRAPHY CHEST, ABDOMEN AND PELVIS TECHNIQUE: Multidetector CT imaging through the chest, abdomen and pelvis was performed using the standard protocol during bolus administration of intravenous contrast. Multiplanar reconstructed images and MIPs were obtained and reviewed to evaluate the vascular anatomy. CONTRAST:  116mL OMNIPAQUE IOHEXOL 350 MG/ML SOLN COMPARISON:  Numerous priors most recent angiography May 30, 2019 and May 26, 2019 FINDINGS: CTA CHEST FINDINGS Cardiovascular: Noncontrast CT images demonstrate displaced intimal calcification of the thoracic aortic arch. The aortic root is incompletely evaluated due to cardiac pulsation artifact though there is gross preservation of the sino-tubular junction. Ascending aorta is normal caliber. There is a common origin of the right brachiocephalic and left common carotid. Post-contrast CT angiographic images demonstrate proximal migration of the previously seen dissection now beginning at the ostium of the left subclavian artery and continuing inferiorly towards the aortic hiatus. There is non opacification of the false lumen within the proximal descending thoracic aorta with progressive narrowing of the true lumen when compared to prior study from May 30, 2019 measuring only 13 x 25 mm in caliber, previously 18 x 28 when measured at a similar level. Conversely  there has been increasing aortic caliber now measuring up to 4.5 cm in maximum diameter, previously 4.1 cm. Dissection flap terminus is at the level of the diaphragmatic hiatus with small fenestration. No involvement of the celiac trunk. Mediastinum/Nodes: There is increasing hazy stranding in the region of the mediastinum adjacent the aorta in the region of the ligamentum arteriosum near the [place]xim[place] stones a aortic dissection. No enlarged mediastinal or axillary lymph nodes. Thyroid gland is unremarkable. Secretions are present in the trachea and proximal airways. The esophagus is patulous and fluid-filled. Lungs/Pleura: Dependent atelectatic changes are seen posteriorly. Mild centrilobular emphysema. Increasing size of a left pleural effusion. Airspace opacities are again seen in the anterior (12/66) and posterolateral right upper lobe (12/90), not significantly changed from prior study. Musculoskeletal: No chest wall mass or suspicious bone lesions identified. Review of the MIP images confirms the above findings. CTA ABDOMEN AND PELVIS FINDINGS VASCULAR Aorta: Mixture of calcified and noncalcified atheromatous plaque is present within the abdominal aorta. Aneurysmal outpouching of the infrarenal abdominal aortic aneurysm measures up to 3.5 cm in maximal diameter. Standard branching of the abdominal aorta. No dissection involvement of the celiac trunk ostia. Celiac:  Calcification in the proximal celiac axis. No dissection propagation occlusion or stenosis. No vasculitis. SMA: Patent without evidence of aneurysm, dissection, vasculitis or significant stenosis. Renals: Both renal arteries are patent without evidence of aneurysm, dissection, vasculitis, fibromuscular dysplasia or significant stenosis. IMA: Patent without evidence of aneurysm, dissection, vasculitis or significant stenosis. Inflow: Small dissection flap is noted in the proximal left internal iliac artery in the region of irregular atheromatous plaque  (10/261) additional dissection flap is present in the proximal right common femoral artery (10/289). Neither is significantly changed from previous exams. Remaining segments in the inflow are heavily diseased but without aneurysm or ectasia or other acute vascular finding. Veins: Major venous structures are unremarkable. Review of the MIP images confirms the above findings. NON-VASCULAR Hepatobiliary: No focal liver abnormality is seen. No gallstones, gallbladder wall thickening, or biliary dilatation. Vicarious extravasation of contrast medium is present within the gallbladder. Pancreas: Unremarkable. No pancreatic ductal dilatation or surrounding inflammatory changes. Spleen: Normal in size without focal abnormality. Adrenals/Urinary Tract: Adrenal glands are unremarkable. Symmetric renal atrophy. No concerning renal lesions. No hydronephrosis. High attenuation material in the bladder likely excreted contrast. Mild bladder wall thickening, likely related to underdistention. Stomach/Bowel: Distal esophagus, stomach and duodenal sweep are unremarkable. No bowel wall thickening or dilatation. No evidence of obstruction. Scattered colonic diverticula without focal pericolonic inflammation to suggest diverticulitis. Lymphatic: No enlarged lymph nodes. Reproductive: The prostate and seminal vesicles are unremarkable. Other: No abdominopelvic free fluid or free gas. No bowel containing hernias. Musculoskeletal: Multilevel degenerative changes are present in the imaged portions of the spine. Findings are maximal at L5-S1. Review of the MIP images confirms the above findings. IMPRESSION: 1. Proximal migration of the previously seen dissection now beginning at the ostium of the left subclavian artery and continuing inferiorly towards the aortic hiatus. There is non opacification of the false lumen within the proximal descending thoracic aorta with progressive narrowing of the true lumen. Conversely there has been increasing  aortic caliber now measuring up to 4.5 cm in maximum diameter, previously 4.1 cm. 2. Increasing mediastinal hazy stranding adjacent the aortic dissection with enlarging left pleural effusion are features highly concerning for pending rupture. Urgent surgical evaluation is warranted. 3. Dissection flap terminus is at the level of the diaphragmatic hiatus with small fenestration. No involvement of the celiac trunk. 4. Unchanged small dissection flap in the proximal left internal iliac and proximal right common femoral arteries. 5. Persistent airspace opacities in the right upper lobe. 6. Vicarious extravasation of contrast within the gallbladder. 7. Symmetric renal atrophy. 8. Emphysema (ICD10-J43.9). 9. Aortic Atherosclerosis (ICD10-I70.0). 10.  Aortic aneurysm NOS (ICD10-I71.9). These results were called by telephone at the time of interpretation on 06/03/2019 at 7:12 pm to Dr. Charlesetta Shanks , who verbally acknowledged these results. Electronically Signed   By: Lovena Le M.D.   On: 06/03/2019 19:16    Dialysis orders:  [facility] 4h 72min 400/1.5 87.5kg 2/2.25 bath P2 RIJ TDC Hep 3000  Parsabiv 2.42mcg IV TIW with HD Calcitriol 1.25 mcg PO Q HD  Assessment/ Plan: 1. Type B acute aortic dissection/ chest pain/ hypertensive emergency:per VVS - s/p repair 8/14  2. ESRD:HD TTS schedule.  Next planned HD Tues but will watch labs and volume status. No heparin. Using [facility].  3. 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  8/15 all meds stopped and required good dose of NE gtt to maintain MAPs  90-100.  NE off as of this AM.  D/w PCCM - will watch this AM but plan to add back orals as indicated.  Part of issue with HTN after recent hospital discharge was inability to obtain all Rx'd meds ([facility] and [facility] to order med ? Minoxidil); would like pt to have secured  meds prior to this D/C.  4. Anemia:Hemoglobin 10, on mircera as an outpatient.  5. Metabolic bone disease:Calcium 9.4. Phos 6.3. Will continue calcitriol. Parsabiv not available inpatient- follow calcium. Continue auryxia. 6. Nutrition:Renal diet with fluid restrictions recommended  7. Recent MSSA bacteremia/ AVG infection: had infection in AVG and had revision in 5/20, then partial resection/ revision in 6/20, then all AVG material (x2) removed 6/30. S/p 6 weeks of ancef.    Jannifer Hick MD 06/08/2019, 9:49 AM  [facility] Pager: [phone]

## 2019-06-09 ENCOUNTER — Encounter (HOSPITAL_COMMUNITY): Payer: Self-pay

## 2019-06-09 ENCOUNTER — Inpatient Hospital Stay (HOSPITAL_COMMUNITY): Payer: Medicare Other

## 2019-06-09 ENCOUNTER — Encounter (HOSPITAL_COMMUNITY): Payer: Self-pay | Admitting: Vascular Surgery

## 2019-06-09 DIAGNOSIS — I7101 Dissection of thoracic aorta: Principal | ICD-10-CM

## 2019-06-09 DIAGNOSIS — R609 Edema, unspecified: Secondary | ICD-10-CM

## 2019-06-09 LAB — BASIC METABOLIC PANEL
Anion gap: 15 (ref 5–15)
BUN: 45 mg/dL — ABNORMAL HIGH (ref 6–20)
CO2: 24 mmol/L (ref 22–32)
Calcium: 9 mg/dL (ref 8.9–10.3)
Chloride: 95 mmol/L — ABNORMAL LOW (ref 98–111)
Creatinine, Ser: 10.09 mg/dL — ABNORMAL HIGH (ref 0.61–1.24)
GFR calc Af Amer: 6 mL/min — ABNORMAL LOW (ref >60)
GFR calc non Af Amer: 5 mL/min — ABNORMAL LOW (ref >60)
Glucose, Bld: 107 mg/dL — ABNORMAL HIGH (ref 70–99)
Potassium: 4.7 mmol/L (ref 3.5–5.1)
Sodium: 134 mmol/L — ABNORMAL LOW (ref 135–145)

## 2019-06-09 LAB — CBC
HCT: 26.8 % — ABNORMAL LOW (ref 39.0–52.0)
Hemoglobin: 8.6 g/dL — ABNORMAL LOW (ref 13.0–17.0)
MCH: 31.4 pg (ref 26.0–34.0)
MCHC: 32.1 g/dL (ref 30.0–36.0)
MCV: 97.8 fL (ref 80.0–100.0)
Platelets: 317 10*3/uL (ref 150–400)
RBC: 2.74 MIL/uL — ABNORMAL LOW (ref 4.22–5.81)
RDW: 16.7 % — ABNORMAL HIGH (ref 11.5–15.5)
WBC: 14.8 10*3/uL — ABNORMAL HIGH (ref 4.0–10.5)
nRBC: 0 % (ref 0.0–0.2)

## 2019-06-09 MED ORDER — AMLODIPINE BESYLATE 10 MG PO TABS
10.0000 mg | ORAL_TABLET | Freq: Every day | ORAL | Status: DC
  Administered 2019-06-09 – 2019-06-11 (×3): 10 mg via ORAL
  Filled 2019-06-09 (×3): qty 1

## 2019-06-09 MED ORDER — CHLORHEXIDINE GLUCONATE CLOTH 2 % EX PADS: 6.0000 | MEDICATED_PAD | Freq: Every day | CUTANEOUS | Status: DC

## 2019-06-09 MED ORDER — HEPARIN SODIUM (PORCINE) 1000 UNIT/ML IJ SOLN
INTRAMUSCULAR | Status: AC
  Administered 2019-06-09: 15:00:00
  Filled 2019-06-09: qty 4

## 2019-06-09 MED ORDER — HYDRALAZINE HCL 50 MG PO TABS
50.0000 mg | ORAL_TABLET | Freq: Three times a day (TID) | ORAL | Status: DC
  Administered 2019-06-09: 50 mg via ORAL
  Filled 2019-06-09: qty 1

## 2019-06-09 MED ORDER — FERRIC CITRATE 1 GM 210 MG(FE) PO TABS
630.0000 mg | ORAL_TABLET | Freq: Three times a day (TID) | ORAL | Status: DC
  Administered 2019-06-09 – 2019-06-10 (×3): 630 mg via ORAL
  Filled 2019-06-09 (×5): qty 3

## 2019-06-09 MED ORDER — CEFAZOLIN SODIUM-DEXTROSE 2-4 GM/100ML-% IV SOLN
2.0000 g | INTRAVENOUS | Status: AC
  Administered 2019-06-10 – 2019-06-12 (×2): 2 g via INTRAVENOUS
  Filled 2019-06-09 (×5): qty 100

## 2019-06-09 MED ORDER — LABETALOL HCL 300 MG PO TABS
300.0000 mg | ORAL_TABLET | Freq: Two times a day (BID) | ORAL | Status: DC
  Administered 2019-06-09 – 2019-06-12 (×6): 300 mg via ORAL
  Filled 2019-06-09 (×6): qty 1

## 2019-06-09 MED ORDER — CEFAZOLIN SODIUM-DEXTROSE 2-4 GM/100ML-% IV SOLN
2.0000 g | Freq: Once | INTRAVENOUS | Status: AC
  Administered 2019-06-09: 2 g via INTRAVENOUS
  Filled 2019-06-09: qty 100

## 2019-06-09 MED ORDER — FERRIC CITRATE 1 GM 210 MG(FE) PO TABS
420.0000 mg | ORAL_TABLET | Freq: Two times a day (BID) | ORAL | Status: DC
  Administered 2019-06-09 – 2019-06-10 (×2): 420 mg via ORAL
  Filled 2019-06-09 (×4): qty 2

## 2019-06-09 MED ORDER — MINOXIDIL 2.5 MG PO TABS
2.5000 mg | ORAL_TABLET | Freq: Two times a day (BID) | ORAL | Status: DC
  Administered 2019-06-09: 2.5 mg via ORAL
  Filled 2019-06-09 (×3): qty 1

## 2019-06-09 MED ORDER — HYDRALAZINE HCL 50 MG PO TABS: 100.0000 mg | ORAL_TABLET | Freq: Three times a day (TID) | ORAL | Status: DC

## 2019-06-09 MED ORDER — MINOXIDIL 2.5 MG PO TABS
5.0000 mg | ORAL_TABLET | Freq: Two times a day (BID) | ORAL | Status: DC
  Filled 2019-06-09: qty 2

## 2019-06-09 NOTE — Progress Notes (Signed)
Lumbar drain catheter removed, tip intact.

## 2019-06-09 NOTE — Addendum Note (Signed)
Addendum  created 06/09/19 1723 by Murvin Natal, MD   Clinical Note Signed

## 2019-06-09 NOTE — Progress Notes (Signed)
[facility]  Subjective  - feels ok legs still a little weak   Objective (!) 157/90 81 98.7 F (37.1 C) (Oral) 18 99%  Intake/Output Summary (Last 24 hours) at 06/09/2019 0957 Last data filed at 06/09/2019 0800 Gross per 24 hour  Intake 1065.79 ml  Output 160 ml  Net 905.79 ml   Neuro legs 5/5 motor Left arm edema  Right groin no hematoma  Assessment/Planning: D/c lumbar drain today Duplex left arm rule out DVT OOB after drain out Keep MAP > 90 SBP goals 120-170 D/c central line left neck when good peripheral access  Ruta Hinds 06/09/2019 9:57 AM --  Laboratory Lab Results: Recent Labs    06/08/19 0511 06/09/19 0345  WBC 15.6* 14.8*  HGB 8.8* 8.6*  HCT 27.6* 26.8*  PLT 299 317   BMET Recent Labs    06/08/19 0511 06/09/19 0345  NA 135 134*  K 4.2 4.7  CL 96* 95*  CO2 25 24  GLUCOSE 104* 107*  BUN 26* 45*  CREATININE 7.17* 10.09*  CALCIUM 9.2 9.0    COAG Lab Results  Component Value Date   INR 1.3 (H) 06/05/2019   INR 1.3 (H) 06/03/2019   INR 1.2 04/23/2019   No results found for: PTT

## 2019-06-09 NOTE — Progress Notes (Signed)
Left upper extremity venous duplex has been completed. Preliminary results can be found in CV Proc through chart review.  Results were given to the patient's nurse, Katelyn.  06/09/19 10:47 AM [facility] RVT
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
## 2019-06-09 NOTE — Progress Notes (Signed)
[facility] Progress Note  Subjective: no c/o today, still on IV esmolol gtt.   Vitals:   06/09/19 0500 06/09/19 0600 06/09/19 0700 06/09/19 0800  BP:      Pulse: 80 84 83 81  Resp: 16 17 18 18   Temp:    98.7 F (37.1 C)  TempSrc:    Oral  SpO2: 99% 100% 94% 99%  Weight:      Height:        Inpatient medications: . amLODipine  10 mg Oral QHS  . ferric citrate  630 mg Oral TID WC   And  . ferric citrate  420 mg Oral BID  . hydrALAZINE  50 mg Oral TID  . labetalol  300 mg Oral BID  . mouth rinse  15 mL Mouth Rinse BID  . minoxidil  2.5 mg Oral BID  . sodium chloride flush  3 mL Intravenous Once  . sodium zirconium cyclosilicate  10 g Oral Daily   . sodium chloride    . sodium chloride    . esmolol 125 mcg/kg/min (06/09/19 0919)   sodium chloride, sodium chloride, alteplase, fentaNYL (SUBLIMAZE) injection, heparin, lidocaine (PF), lidocaine-prilocaine, oxyCODONE-acetaminophen, pentafluoroprop-tetrafluoroeth    Exam: General: tired but nontoxic Heart: RRR Lungs: clear anteriorly Extremities: trace LEedema, L arm warm/ red, slightly tender Dialysis Access:  [facility] c/d/i Neuro: conversant, NF      Dialysis: [facility]  4h 26min  400/1.5  87.5kg  2/2.25  RIJ TDC  Hep 3000  parsabiv 2.5ug tiw w/ HD  calcitriol 1.25 ug tiw w/ HD   Assessment/ Plan: 1. Type B acute aortic dissection/ chest pain/ hypertensive- s/p stent graft repair 8/14 2.  L arm pain/ redness: ? Cellulitis, arm is tender and warm, defer to primary team 3. ESRD:HD TTS schedule. Will plan extra HD today for vol excess, then again tomorrow to get back on schedule. No heparin, using TDC. 4. Hypertension/volume:at baseline has markedly resistant HTN but developed hypotension post op w/ associated acute paraplegia with low MAPs + CSF drain.  8/15 all meds stopped and required NE gtt to maintain MAPs 90-100.  Pressors off and having HTN again on IV esmolol. Is up 6kg by wts. Plan to add back  orals as indicated and do extra HD today for vol excess.  Part of issue with HTN after recent hospital discharge was inability to obtain all his Rx meds ([facility] and [facility] to order med ? Minoxidil); would like to make sure patient has secured meds in hand prior to this D/C.  4. Anemia:Hemoglobin 10, on mircera as an outpatient.  5. Metabolic bone disease:Calcium 9.4. Phos6.3. Will continue calcitriol. Parsabiv not available inpatient- follow calcium. Continue auryxia. 6. Nutrition:Renal diet with fluid restrictions recommended  7. Recent MSSA bacteremia/ AVG infection: had infected AVG and had revision in 5/20, then partial resection/ revision in 6/20, then all AVG material (x2) removed 6/30.S/p 6 weeks of ancef IV completed        [facility] 06/09/2019, 11:13 AM  Iron/TIBC/Ferritin/ %Sat    Component Value Date/Time   IRON 38 (L) 04/22/2019 1130   TIBC 211 (L) 04/22/2019 1130   FERRITIN 305 08/03/2017 1028   IRONPCTSAT 18 04/22/2019 1130   Recent Labs  Lab 06/05/19 0252  06/07/19 0333  06/09/19 0345  NA 127*   < > 134*   < > 134*  K 5.3*   < > 5.1   < > 4.7  CL 87*   < >  93*   < > 95*  CO2 24   < > 22   < > 24  GLUCOSE 113*   < > 112*   < > 107*  BUN 53*   < > 43*   < > 45*  CREATININE 12.87*   < > 10.38*   < > 10.09*  CALCIUM 9.1   < > 9.3   < > 9.0  PHOS 6.6*   < > 6.3*  --   --   ALBUMIN 2.5*   < > 2.9*  --   --   INR 1.3*  --   --   --   --    < > = values in this interval not displayed.   No results for input(s): AST, ALT, ALKPHOS, BILITOT, PROT in the last 168 hours. Recent Labs  Lab 06/09/19 0345  WBC 14.8*  HGB 8.6*  HCT 26.8*  PLT 317

## 2019-06-09 NOTE — Progress Notes (Signed)
Pharmacy Antibiotic Note  Frank Fuller is a 58 y.o. male here with acute aortic dissection and noted with  cellulitis.  Pharmacy has been consulted for ancef dosing. He is noted with ESRD on HD TTS. Plans for HD today then TTS -WBC= 14.8, tmax= 100.3  Plan: -Ancef 2gm IV today then 2gm IV TTS -Will follow  patient progress   Height: 6' 3.5" (191.8 cm) Weight: 205 lb 11 oz (93.3 kg) IBW/kg (Calculated) : 85.65  Temp (24hrs), Avg:99.3 F (37.4 C), Min:98.7 F (37.1 C), Max:99.9 F (37.7 C)  Recent Labs  Lab 06/06/19 0311 06/06/19 1243 06/07/19 0333 06/08/19 0511 06/09/19 0345  WBC 7.1 10.5 13.1* 15.6* 14.8*  CREATININE 8.40* 9.04* 10.38* 7.17* 10.09*    Estimated Creatinine Clearance: 9.4 mL/min (A) (by C-G formula based on SCr of 10.09 mg/dL (H)).    Allergies  Allergen Reactions  . Oxycodone Nausea Only  . Chlorhexidine Other (See Comments)    Unknown reaction Patch skin test done at dialysis 06/26/17  - staff using clear dressing and alcohol to clean exit site of catheter  . Clonidine Derivatives Other (See Comments)    Dizziness   . Lisinopril Other (See Comments)    unresponsive  . Carvedilol Rash      Thank you for allowing pharmacy to be a part of this patient's care.  Hildred Laser, PharmD Clinical Pharmacist **Pharmacist phone directory can now be found on [email or web address] ([email or web address]).  Listed under [place].

## 2019-06-10 ENCOUNTER — Other Ambulatory Visit: Payer: Self-pay

## 2019-06-10 DIAGNOSIS — I160 Hypertensive urgency: Secondary | ICD-10-CM

## 2019-06-10 LAB — TYPE AND SCREEN
ABO/RH(D): O POS
Antibody Screen: NEGATIVE
Unit division: 0
Unit division: 0

## 2019-06-10 LAB — BPAM RBC
Blood Product Expiration Date: 202009092359
Blood Product Expiration Date: 202009092359
ISSUE DATE / TIME: 202008140805
ISSUE DATE / TIME: 202008140805
Unit Type and Rh: 5100
Unit Type and Rh: 5100

## 2019-06-10 LAB — CBC
HCT: 27.3 % — ABNORMAL LOW (ref 39.0–52.0)
Hemoglobin: 8.7 g/dL — ABNORMAL LOW (ref 13.0–17.0)
MCH: 30.6 pg (ref 26.0–34.0)
MCHC: 31.9 g/dL (ref 30.0–36.0)
MCV: 96.1 fL (ref 80.0–100.0)
Platelets: 317 10*3/uL (ref 150–400)
RBC: 2.84 MIL/uL — ABNORMAL LOW (ref 4.22–5.81)
RDW: 16.4 % — ABNORMAL HIGH (ref 11.5–15.5)
WBC: 11.5 10*3/uL — ABNORMAL HIGH (ref 4.0–10.5)
nRBC: 0 % (ref 0.0–0.2)

## 2019-06-10 LAB — BASIC METABOLIC PANEL
Anion gap: 14 (ref 5–15)
BUN: 36 mg/dL — ABNORMAL HIGH (ref 6–20)
CO2: 25 mmol/L (ref 22–32)
Calcium: 8.9 mg/dL (ref 8.9–10.3)
Chloride: 96 mmol/L — ABNORMAL LOW (ref 98–111)
Creatinine, Ser: 7.9 mg/dL — ABNORMAL HIGH (ref 0.61–1.24)
GFR calc Af Amer: 8 mL/min — ABNORMAL LOW (ref >60)
GFR calc non Af Amer: 7 mL/min — ABNORMAL LOW (ref >60)
Glucose, Bld: 104 mg/dL — ABNORMAL HIGH (ref 70–99)
Potassium: 4.3 mmol/L (ref 3.5–5.1)
Sodium: 135 mmol/L (ref 135–145)

## 2019-06-10 MED ORDER — HEPARIN SODIUM (PORCINE) 1000 UNIT/ML IJ SOLN
INTRAMUSCULAR | Status: AC
  Filled 2019-06-10: qty 4

## 2019-06-10 MED ORDER — FERRIC CITRATE 1 GM 210 MG(FE) PO TABS
420.0000 mg | ORAL_TABLET | Freq: Two times a day (BID) | ORAL | Status: DC | PRN
  Filled 2019-06-10: qty 2

## 2019-06-10 MED ORDER — HEPARIN SODIUM (PORCINE) 1000 UNIT/ML IJ SOLN
3.4000 mL | Freq: Once | INTRAMUSCULAR | Status: AC
  Administered 2019-06-10: 3400 [IU] via INTRAVENOUS

## 2019-06-10 MED ORDER — MINOXIDIL 2.5 MG PO TABS
5.0000 mg | ORAL_TABLET | Freq: Two times a day (BID) | ORAL | Status: DC
  Administered 2019-06-10 – 2019-06-11 (×4): 5 mg via ORAL
  Filled 2019-06-10 (×6): qty 2

## 2019-06-10 MED ORDER — LOSARTAN POTASSIUM 50 MG PO TABS
100.0000 mg | ORAL_TABLET | Freq: Every day | ORAL | Status: DC
  Administered 2019-06-10 – 2019-06-12 (×3): 100 mg via ORAL
  Filled 2019-06-10 (×3): qty 2

## 2019-06-10 MED ORDER — HYDRALAZINE HCL 50 MG PO TABS
100.0000 mg | ORAL_TABLET | Freq: Three times a day (TID) | ORAL | Status: DC
  Administered 2019-06-10 – 2019-06-12 (×7): 100 mg via ORAL
  Filled 2019-06-10 (×7): qty 2

## 2019-06-10 MED ORDER — FERRIC CITRATE 1 GM 210 MG(FE) PO TABS
630.0000 mg | ORAL_TABLET | Freq: Three times a day (TID) | ORAL | Status: DC
  Administered 2019-06-10 – 2019-06-11 (×4): 630 mg via ORAL
  Filled 2019-06-10 (×8): qty 3

## 2019-06-10 NOTE — Progress Notes (Signed)
[facility]  Subjective  - feels ok   Objective (!) 154/93 87 98.4 F (36.9 C) (Oral) (!) 26 97%  Intake/Output Summary (Last 24 hours) at 06/10/2019 1107 Last data filed at 06/09/2019 2200 Gross per 24 hour  Intake 895.35 ml  Output 3000 ml  Net -2104.65 ml   Right groin no hematoma Neuro sensation motor intact in legs Left arm still warm less swollen  Assessment/Planning: Hypertension back on oral meds Should be able to transfer to 4e if bed available Home next 1-2 days if BP reasonable control OOB ambulate today  Ruta Hinds 06/10/2019 11:07 AM --  Laboratory Lab Results: Recent Labs    06/09/19 0345 06/10/19 0319  WBC 14.8* 11.5*  HGB 8.6* 8.7*  HCT 26.8* 27.3*  PLT 317 317   BMET Recent Labs    06/09/19 0345 06/10/19 0319  NA 134* 135  K 4.7 4.3  CL 95* 96*  CO2 24 25  GLUCOSE 107* 104*  BUN 45* 36*  CREATININE 10.09* 7.90*  CALCIUM 9.0 8.9    COAG Lab Results  Component Value Date   INR 1.3 (H) 06/05/2019   INR 1.3 (H) 06/03/2019   INR 1.2 04/23/2019   No results found for: PTT
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
## 2019-06-10 NOTE — Progress Notes (Addendum)
[facility] Progress Note  Subjective: no c/o today, transferring out of ICU  Vitals:   06/10/19 1027 06/10/19 1030 06/10/19 1057 06/10/19 1128  BP: (!) 161/114 (!) 154/93 (!) 153/89   Pulse: 87 87 83   Resp: 12 (!) 26 16   Temp:   98.6 F (37 C) 98.3 F (36.8 C)  TempSrc:   Oral Oral  SpO2: 96% 97% 98%   Weight:   87.2 kg   Height:        Inpatient medications: . amLODipine  10 mg Oral QHS  . ferric citrate  630 mg Oral TID WC   And  . ferric citrate  420 mg Oral BID  . hydrALAZINE  100 mg Oral TID  . labetalol  300 mg Oral BID  . losartan  100 mg Oral Daily  . mouth rinse  15 mL Mouth Rinse BID  . minoxidil  5 mg Oral BID  . sodium chloride flush  3 mL Intravenous Once  . sodium zirconium cyclosilicate  10 g Oral Daily   . sodium chloride    . sodium chloride    .  ceFAZolin (ANCEF) IV 2 g (06/10/19 1027)   sodium chloride, sodium chloride, alteplase, fentaNYL (SUBLIMAZE) injection, heparin, lidocaine (PF), lidocaine-prilocaine, oxyCODONE-acetaminophen, pentafluoroprop-tetrafluoroeth    Exam: General: tired but nontoxic Heart: RRR Lungs: clear anteriorly Extremities: no LE edema, L arm warm/ red, improving Dialysis Access:  [facility] c/d/i Neuro: conversant, NF      Dialysis: [facility]  4h 26min  400/1.5  87.5kg  2/2.25  RIJ TDC  Hep 3000  parsabiv 2.5ug tiw w/ HD  calcitriol 1.25 ug tiw w/ HD   Assessment/ Plan: 1. Type B acute aortic dissection/ chest pain/ hypertensive- s/p stent graft repair 8/14 2.  L arm cellulitis: on 5 day course IV ancef, improving 3. ESRD:HD TTS schedule. No heparin, using TDC. HD tomorrow am. 4. 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 Post-op pt developed hypotension w/ associated acute paraplegia. Received pressors briefly then BP's rebounded and required temp esmolol gtt. Now is back on po BP meds today and BP's reasonably well controlled. SP HD and is down to dry wt.  Agree w/ VVS goal for  SBP 120- 170. Will want to make sure patient has his DC home meds in hand prior to D/C.  4. Anemia:Hemoglobin 10, on mircera as an outpatient.  5. Metabolic bone disease:Calcium 9.4. Phos6.3. Will continue calcitriol. Parsabiv not available inpatient- follow calcium. Continue auryxia. 6. Nutrition:Renal diet with fluid restrictions recommended  7. Recent MSSA bacteremia/ AVG infection: had infected AVG and had revision in 5/20, then partial resection/ revision in 6/20, then all AVG material (x2) removed 6/30.S/p 6 weeks of ancef IV completed     [facility] 06/11/2019, 11:59 AM  Iron/TIBC/Ferritin/ %Sat    Component Value Date/Time   IRON 38 (L) 04/22/2019 1130   TIBC 211 (L) 04/22/2019 1130   FERRITIN 305 08/03/2017 1028   IRONPCTSAT 18 04/22/2019 1130   Recent Labs  Lab 06/05/19 0252  06/07/19 0333  06/10/19 0319  NA 127*   < > 134*   < > 135  K 5.3*   < > 5.1   < > 4.3  CL 87*   < > 93*   < > 96*  CO2 24   < > 22   < > 25  GLUCOSE 113*   < > 112*   < > 104*  BUN 53*   < >  43*   < > 36*  CREATININE 12.87*   < > 10.38*   < > 7.90*  CALCIUM 9.1   < > 9.3   < > 8.9  PHOS 6.6*   < > 6.3*  --   --   ALBUMIN 2.5*   < > 2.9*  --   --   INR 1.3*  --   --   --   --    < > = values in this interval not displayed.   No results for input(s): AST, ALT, ALKPHOS, BILITOT, PROT in the last 168 hours. Recent Labs  Lab 06/10/19 0319  WBC 11.5*  HGB 8.7*  HCT 27.3*  PLT 317

## 2019-06-11 LAB — CBC
HCT: 28.5 % — ABNORMAL LOW (ref 39.0–52.0)
Hemoglobin: 9.2 g/dL — ABNORMAL LOW (ref 13.0–17.0)
MCH: 31 pg (ref 26.0–34.0)
MCHC: 32.3 g/dL (ref 30.0–36.0)
MCV: 96 fL (ref 80.0–100.0)
Platelets: 357 10*3/uL (ref 150–400)
RBC: 2.97 MIL/uL — ABNORMAL LOW (ref 4.22–5.81)
RDW: 16.3 % — ABNORMAL HIGH (ref 11.5–15.5)
WBC: 8.7 10*3/uL (ref 4.0–10.5)
nRBC: 0 % (ref 0.0–0.2)

## 2019-06-11 LAB — BASIC METABOLIC PANEL
Anion gap: 15 (ref 5–15)
BUN: 36 mg/dL — ABNORMAL HIGH (ref 6–20)
CO2: 25 mmol/L (ref 22–32)
Calcium: 9.2 mg/dL (ref 8.9–10.3)
Chloride: 95 mmol/L — ABNORMAL LOW (ref 98–111)
Creatinine, Ser: 6.89 mg/dL — ABNORMAL HIGH (ref 0.61–1.24)
GFR calc Af Amer: 9 mL/min — ABNORMAL LOW (ref >60)
GFR calc non Af Amer: 8 mL/min — ABNORMAL LOW (ref >60)
Glucose, Bld: 102 mg/dL — ABNORMAL HIGH (ref 70–99)
Potassium: 4.1 mmol/L (ref 3.5–5.1)
Sodium: 135 mmol/L (ref 135–145)

## 2019-06-11 MED ORDER — CHLORHEXIDINE GLUCONATE CLOTH 2 % EX PADS
6.0000 | MEDICATED_PAD | Freq: Every day | CUTANEOUS | Status: DC
  Administered 2019-06-11 – 2019-06-12 (×2): 6 via TOPICAL

## 2019-06-11 MED ORDER — POLYETHYLENE GLYCOL 3350 17 G PO PACK
17.0000 g | PACK | Freq: Every day | ORAL | Status: DC
  Administered 2019-06-11: 17 g via ORAL
  Filled 2019-06-11: qty 1
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
## 2019-06-11 NOTE — Plan of Care (Signed)
  Problem: Health Behavior/Discharge Planning: Goal: Ability to manage health-related needs will improve Outcome: Progressing   Problem: Clinical Measurements: Goal: Will remain free from infection Outcome: Progressing Goal: Cardiovascular complication will be avoided Outcome: Progressing   
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
## 2019-06-11 NOTE — Progress Notes (Signed)
[facility]  Subjective  - feels ok still some mild weakness in legs subjectively   Objective (!) 145/83 87 98.9 F (37.2 C) (Oral) 12 96%  Intake/Output Summary (Last 24 hours) at 06/11/2019 0801 Last data filed at 06/11/2019 [id number] Gross per 24 hour  Intake 580 ml  Output 3001 ml  Net -2421 ml     Assessment/Planning: Post stent graft for dissection needs CT 1 month BP control reasonable 120-170 is goal Hopefully home tomorrow after HD  Ruta Hinds 06/11/2019 8:01 AM --  Laboratory Lab Results: Recent Labs    06/10/19 0319 06/11/19 0352  WBC 11.5* 8.7  HGB 8.7* 9.2*  HCT 27.3* 28.5*  PLT 317 357   BMET Recent Labs    06/10/19 0319 06/11/19 0352  NA 135 135  K 4.3 4.1  CL 96* 95*  CO2 25 25  GLUCOSE 104* 102*  BUN 36* 36*  CREATININE 7.90* 6.89*  CALCIUM 8.9 9.2    COAG Lab Results  Component Value Date   INR 1.3 (H) 06/05/2019   INR 1.3 (H) 06/03/2019   INR 1.2 04/23/2019   No results found for: PTT

## 2019-06-11 NOTE — Plan of Care (Signed)
Continue to monitor

## 2019-06-11 NOTE — Plan of Care (Signed)

## 2019-06-11 NOTE — Progress Notes (Signed)
Patient arrived to 4E room 06 at this time. Telemetry applied and CCMD notified. V/s and assessment complete. CHG bath done.   Emelda Fear, RN

## 2019-06-12 LAB — CBC
HCT: 26.2 % — ABNORMAL LOW (ref 39.0–52.0)
Hemoglobin: 8.7 g/dL — ABNORMAL LOW (ref 13.0–17.0)
MCH: 31.3 pg (ref 26.0–34.0)
MCHC: 33.2 g/dL (ref 30.0–36.0)
MCV: 94.2 fL (ref 80.0–100.0)
Platelets: 339 10*3/uL (ref 150–400)
RBC: 2.78 MIL/uL — ABNORMAL LOW (ref 4.22–5.81)
RDW: 15.9 % — ABNORMAL HIGH (ref 11.5–15.5)
WBC: 8.9 10*3/uL (ref 4.0–10.5)
nRBC: 0 % (ref 0.0–0.2)

## 2019-06-12 LAB — BASIC METABOLIC PANEL
Anion gap: 14 (ref 5–15)
BUN: 55 mg/dL — ABNORMAL HIGH (ref 6–20)
CO2: 24 mmol/L (ref 22–32)
Calcium: 9.1 mg/dL (ref 8.9–10.3)
Chloride: 94 mmol/L — ABNORMAL LOW (ref 98–111)
Creatinine, Ser: 9.29 mg/dL — ABNORMAL HIGH (ref 0.61–1.24)
GFR calc Af Amer: 6 mL/min — ABNORMAL LOW (ref >60)
GFR calc non Af Amer: 6 mL/min — ABNORMAL LOW (ref >60)
Glucose, Bld: 105 mg/dL — ABNORMAL HIGH (ref 70–99)
Potassium: 4.1 mmol/L (ref 3.5–5.1)
Sodium: 132 mmol/L — ABNORMAL LOW (ref 135–145)

## 2019-06-12 MED ORDER — LOSARTAN POTASSIUM 100 MG PO TABS: 100.0000 mg | ORAL_TABLET | Freq: Every day | ORAL | 0 refills | Status: DC

## 2019-06-12 MED ORDER — HYDRALAZINE HCL 100 MG PO TABS: 100.0000 mg | ORAL_TABLET | Freq: Three times a day (TID) | ORAL | 0 refills | Status: DC

## 2019-06-12 MED ORDER — LABETALOL HCL 300 MG PO TABS: 300.0000 mg | ORAL_TABLET | Freq: Two times a day (BID) | ORAL | 0 refills | Status: DC

## 2019-06-12 MED ORDER — MINOXIDIL 2.5 MG PO TABS: 5.0000 mg | ORAL_TABLET | Freq: Two times a day (BID) | ORAL | 0 refills | Status: DC

## 2019-06-12 MED ORDER — HEPARIN SODIUM (PORCINE) 1000 UNIT/ML IJ SOLN
INTRAMUSCULAR | Status: AC
  Filled 2019-06-12: qty 4

## 2019-06-12 MED ORDER — AMLODIPINE BESYLATE 10 MG PO TABS: 10.0000 mg | ORAL_TABLET | Freq: Every day | ORAL | 0 refills | Status: DC

## 2019-06-12 MED FILL — LOSARTAN POTASSIUM 100 MG T: 100 | 30 days supply | Qty: 30 | Fill #0

## 2019-06-12 MED FILL — AMLODIPINE BESYLATE 10 MG T: 10 | 30 days supply | Qty: 30 | Fill #0

## 2019-06-12 MED FILL — LABETALOL HCL 300 MG TABLET: 300 | 30 days supply | Qty: 60 | Fill #0

## 2019-06-12 MED FILL — MINOXIDIL 2.5 MG TABLET: 2.5 | 30 days supply | Qty: 120 | Fill #0

## 2019-06-12 MED FILL — hydrALAZINE HCL 100 MG TABS: 100 | 90 days supply | Qty: 90 | Fill #0

## 2019-06-12 NOTE — Progress Notes (Signed)
[facility] Progress Note  Subjective: seen on HD, no c/o today  Vitals:   06/12/19 0815 06/12/19 0845 06/12/19 0915 06/12/19 0945  BP: (!) 152/91 (!) 171/104 (!) 175/103 112/62  Pulse: 91 90 95 74  Resp: 17     Temp:      TempSrc:      SpO2:      Weight:      Height:        Inpatient medications: . amLODipine  10 mg Oral QHS  . Chlorhexidine Gluconate Cloth  6 each Topical Q0600  . ferric citrate  630 mg Oral TID WC  . hydrALAZINE  100 mg Oral TID  . labetalol  300 mg Oral BID  . losartan  100 mg Oral Daily  . mouth rinse  15 mL Mouth Rinse BID  . minoxidil  5 mg Oral BID  . polyethylene glycol  17 g Oral Daily  . sodium chloride flush  3 mL Intravenous Once  . sodium zirconium cyclosilicate  10 g Oral Daily   . sodium chloride    . sodium chloride    .  ceFAZolin (ANCEF) IV 2 g (06/10/19 1027)   sodium chloride, sodium chloride, alteplase, fentaNYL (SUBLIMAZE) injection, ferric citrate **AND** ferric citrate, heparin, lidocaine (PF), lidocaine-prilocaine, oxyCODONE-acetaminophen, pentafluoroprop-tetrafluoroeth    Exam: General: nasal O2, no distress Heart: RRR Lungs: clear anteriorly Extremities: no LE edema, L arm warm/ red, improving Dialysis Access:  [facility] c/d/i Neuro: conversant, NF      Dialysis: [facility]  4h 3min  400/1.5  87.5kg  2/2.25  [facility] TDC  Hep 3000  parsabiv 2.5ug tiw w/ HD  calcitriol 1.25 ug tiw w/ HD   Assessment/ Plan: 1. Type B acute aortic dissection/ chest pain/ hypertensive- s/p stent graft repair 8/14. Probable dc today after HD 2.  L arm cellulitis: on abx, improving 3. ESRD:HD TTS schedule. No heparin, using TDC. HD this am 4. 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 Post-op pt developed hypotension w/ associated acute paraplegia. Received pressors briefly then BP's rebounded and required temp esmolol gtt. Back on BP meds and BP's reasonably well controlled.  Agree w/ VVS goal for SBP 120- 170.  Dry wt here was challenged unsuccessfully, no change in edw. Will want to make sure patient has his DC home meds in hand prior to D/C.  4. Anemia:Hemoglobin 10, on mircera as an outpatient.  5. Metabolic bone disease:Calcium 9.4. Phos6.3. Will continue calcitriol. Parsabiv not available inpatient- follow calcium. Continue auryxia. 6. Nutrition:Renal diet with fluid restrictions recommended  7. Recent MSSA bacteremia/ AVG infection: had infected AVG and had revision in 5/20, then partial resection/ revision in 6/20, then all AVG material (x2) removed 6/30.S/p 6 weeks of ancef IV completed     [facility] 06/11/2019, 10:09 AM  Iron/TIBC/Ferritin/ %Sat    Component Value Date/Time   IRON 38 (L) 04/22/2019 1130   TIBC 211 (L) 04/22/2019 1130   FERRITIN 305 08/03/2017 1028   IRONPCTSAT 18 04/22/2019 1130   Recent Labs  Lab 06/07/19 0333  06/12/19 0312  NA 134*   < > 132*  K 5.1   < > 4.1  CL 93*   < > 94*  CO2 22   < > 24  GLUCOSE 112*   < > 105*  BUN 43*   < > 55*  CREATININE 10.38*   < > 9.29*  CALCIUM 9.3   < > 9.1  PHOS 6.3*  --   --  ALBUMIN 2.9*  --   --    < > = values in this interval not displayed.   No results for input(s): AST, ALT, ALKPHOS, BILITOT, PROT in the last 168 hours. Recent Labs  Lab 06/12/19 0312  WBC 8.9  HGB 8.7*  HCT 26.2*  PLT 339

## 2019-06-12 NOTE — Care Management Important Message (Signed)
Important Message  Patient Details  Name: Frank Fuller MRN: [id number] Date of Birth: 1961-05-07   Medicare Important Message Given:  Yes     Shelda Altes 06/12/2019, 2:34 PM

## 2019-06-12 NOTE — Progress Notes (Signed)
Order received to discharge patient.  Telemetry monitor removed and CCMD notified.  PIV access removed.  Discharge instructions, follow up, medications and instructions for their use discussed with patient.  Patient's prescriptions were delivered by [facility].  Patient left his bag of medications in the room when he left.  Patient was called on his cell phone and a message was left asking him to come back to the hospital and pick up his medications.  Unit phone # was given to pt.  Medications are at the nurses station.

## 2019-06-12 NOTE — TOC Transition Note (Addendum)
Transition of Care [facility]) - CM/SW Discharge Note Marvetta Gibbons RN, BSN Transitions of Care Unit 4E- RN Case Manager [phone]   Patient Details  Name: Frank Fuller MRN: [id number] Date of Birth: 1961/06/03  Transition of Care [facility]) CM/SW Contact:  Dawayne Patricia, RN Phone Number: 06/12/2019, 1:26 PM   Clinical Narrative:    Pt stable for transition home, notified by [facility] that pt has pre-op referral to them for any HH needs- recs per PT eval for [facility] and DME- RW- bedside RN to request orders. CM spoke with pt at bedside on return from HD- pt states that he would like RW and is willing to wait for DME- reports that his ride is on their way (about 20/25 min. Away). Discussed HH needs- and pre-op referral to [facility]- choice offered to pt per CMS guidelines- pt agreeable with [facility] for [facility] needs- notified Tiffany with [facility] of transition home today and [facility] will f/u for [facility] services HHPT.  TOC pharmacy has delivered pt's meds to bedside for discharge- Pt reports that he usually goes to [facility] and denies any difficulties getting his meds.    Final next level of care: [place] Barriers to Discharge: No Barriers Identified   Patient Goals and CMS Choice Patient states their goals for this hospitalization and ongoing recovery are:: ready to get out of here- go home CMS Medicare.gov Compare Post Acute Care list provided to:: Patient Choice offered to / list presented to : NA  Discharge Placement  Home with [facility]                     Discharge Plan and Services   Discharge Planning Services: CM Consult Post Acute Care Choice: Durable Medical Equipment, Home Health          DME Arranged: [place] rolling DME Agency: [employer] Date DME Agency Contacted: 06/12/19 Time DME Agency Contacted: 61 Representative spoke with at DME Agency: [place]: PT [place] Date [place]: 06/12/19 Time [place]: 83 Representative spoke with at [place]: [place] ([place]) Interventions     Readmission Risk Interventions Readmission Risk Prevention Plan 06/12/2019  Transportation Screening Complete  Medication Review Press photographer) Complete  PCP or Specialist appointment within 3-5 days of discharge Complete  HRI or [place] Complete  SW Recovery Care/Counseling Consult Complete  [place] Not Applicable

## 2019-06-12 NOTE — Progress Notes (Signed)
Patient does not want the percocet. Patient stated that, "it binds him up". Patient requesting fentanyl

## 2019-06-12 NOTE — Progress Notes (Signed)
[facility] Progress Note  Subjective: no c/o today, had HD yest and L arm feeling better , still swollen, is off esmolol today, home BP meds started back overnight and this am.         Vitals:   06/10/19 1027 06/10/19 1030 06/10/19 1057 06/10/19 1128  BP: (!) 161/114 (!) 154/93 (!) 153/89   Pulse: 87 87 83   Resp: 12 (!) 26 16   Temp:   98.6 F (37 C) 98.3 F (36.8 C)  TempSrc:   Oral Oral  SpO2: 96% 97% 98%   Weight:   87.2 kg   Height:        Inpatient medications: . amLODipine  10 mg Oral QHS  . ferric citrate  630 mg Oral TID WC   And  . ferric citrate  420 mg Oral BID  . hydrALAZINE  100 mg Oral TID  . labetalol  300 mg Oral BID  . losartan  100 mg Oral Daily  . mouth rinse  15 mL Mouth Rinse BID  . minoxidil  5 mg Oral BID  . sodium chloride flush  3 mL Intravenous Once  . sodium zirconium cyclosilicate  10 g Oral Daily   . sodium chloride    . sodium chloride    .  ceFAZolin (ANCEF) IV 2 g (06/10/19 1027)   sodium chloride, sodium chloride, alteplase, fentaNYL (SUBLIMAZE) injection, heparin, lidocaine (PF), lidocaine-prilocaine, oxyCODONE-acetaminophen, pentafluoroprop-tetrafluoroeth    Exam: General: tired but nontoxic Heart: RRR Lungs: clear anteriorly Extremities:trace LEedema, L arm warm/ red, slightly tender Dialysis Access: [facility] c/d/i Neuro: conversant, NF      Dialysis: [facility]  4h 39min  400/1.5  87.5kg  2/2.25  [facility] TDC  Hep 3000  parsabiv 2.5ug tiw w/ HD  calcitriol 1.25 ug tiw w/ HD   Assessment/ Plan: 1. Type B acute aortic dissection/ chest pain/ hypertensive- s/p stent graft repair 8/14 2.  L arm cellulitis: on 5 day course IV ancef, improving 3. ESRD:HD TTS schedule. No heparin, using TDC.3L off yest and again 3L off today, at dry wt post HD this am. Next HD 8/20 4. Hypertension/volume:at baseline has markedly resistant HTN but developed hypotension post op w/ associated acute  paraplegia. Received pressors briefly then BP's rebounded and restarted on esmolol gtt.  This is now titrated off and taking home po BP meds today and w/ HD BP's are improving. We will want to make sure patient has his DC home meds in hand prior to D/C.  4. Anemia:Hemoglobin 10, on mircera as an outpatient.  5. Metabolic bone disease:Calcium 9.4. Phos6.3. Will continue calcitriol. Parsabiv not available inpatient- follow calcium. Continue auryxia. 6. Nutrition:Renal diet with fluid restrictions recommended  7. Recent MSSA bacteremia/ AVG infection: had infected AVG and had revision in 5/20, then partial resection/ revision in 6/20, then all AVG material (x2) removed 6/30.S/p 6 weeks of ancef IV completed        [facility] 06/10/2019, 11:59 AM

## 2019-06-12 NOTE — Progress Notes (Addendum)
[facility]  Subjective  - No new complaints.   Objective (!) 143/115 (!) 102 99.2 F (37.3 C) (Oral) 20 95%  Intake/Output Summary (Last 24 hours) at 06/12/2019 0749 Last data filed at 06/11/2019 2346 Gross per 24 hour  Intake 440 ml  Output -  Net 440 ml    Right groin soft without hematoma Moving all 4 extremities well Feet well perfused   Assessment/Planning: S/P TEVAR  Post stent graft for dissection needs CT 1 month BP control reasonable 120-170 is goal Going to HD now   [employer] 06/12/2019 7:49 AM -- SBP 140-160 yesterday. [place] for d/c from our [place], [place] [facility] [place] Office: [phone] Pager: [phone]   Laboratory Lab Results: Recent Labs    06/11/19 0352 06/12/19 0312  WBC 8.7 8.9  HGB 9.2* 8.7*  HCT 28.5* 26.2*  PLT 357 339   BMET Recent Labs    06/11/19 0352 06/12/19 0312  NA 135 132*  K 4.1 4.1  CL 95* 94*  CO2 25 24  GLUCOSE 102* 105*  BUN 36* 55*  CREATININE 6.89* 9.29*  CALCIUM 9.2 9.1    COAG Lab Results  Component Value Date   INR 1.3 (H) 06/05/2019   INR 1.3 (H) 06/03/2019   INR 1.2 04/23/2019   No results found for: PTT

## 2019-06-13 ENCOUNTER — Encounter (HOSPITAL_COMMUNITY): Payer: Medicare Other

## 2019-06-13 ENCOUNTER — Inpatient Hospital Stay (HOSPITAL_COMMUNITY)
Admission: AD | Admit: 2019-06-13 | Discharge: 2019-07-03 | DRG: 871 | Disposition: A | Discharge status: Rehabilitation Facility | Payer: Medicare Other | Source: Other Acute Inpatient Hospital | Attending: Internal Medicine | Admitting: Internal Medicine

## 2019-06-13 DIAGNOSIS — A419 Sepsis, unspecified organism: Secondary | ICD-10-CM | POA: Insufficient documentation

## 2019-06-13 DIAGNOSIS — Z20828 Contact with and (suspected) exposure to other viral communicable diseases: Secondary | ICD-10-CM | POA: Diagnosis present

## 2019-06-13 DIAGNOSIS — Z888 Allergy status to other drugs, medicaments and biological substances status: Secondary | ICD-10-CM

## 2019-06-13 DIAGNOSIS — I150 Renovascular hypertension: Secondary | ICD-10-CM | POA: Diagnosis not present

## 2019-06-13 DIAGNOSIS — F329 Major depressive disorder, single episode, unspecified: Secondary | ICD-10-CM | POA: Diagnosis present

## 2019-06-13 DIAGNOSIS — Z8679 Personal history of other diseases of the circulatory system: Secondary | ICD-10-CM | POA: Diagnosis not present

## 2019-06-13 DIAGNOSIS — I120 Hypertensive chronic kidney disease with stage 5 chronic kidney disease or end stage renal disease: Secondary | ICD-10-CM | POA: Diagnosis not present

## 2019-06-13 DIAGNOSIS — I959 Hypotension, unspecified: Secondary | ICD-10-CM | POA: Diagnosis not present

## 2019-06-13 DIAGNOSIS — A048 Other specified bacterial intestinal infections: Secondary | ICD-10-CM

## 2019-06-13 DIAGNOSIS — M4646 Discitis, unspecified, lumbar region: Secondary | ICD-10-CM | POA: Diagnosis not present

## 2019-06-13 DIAGNOSIS — G9520 Unspecified cord compression: Secondary | ICD-10-CM | POA: Diagnosis not present

## 2019-06-13 DIAGNOSIS — G8220 Paraplegia, unspecified: Secondary | ICD-10-CM | POA: Diagnosis present

## 2019-06-13 DIAGNOSIS — K567 Ileus, unspecified: Secondary | ICD-10-CM | POA: Diagnosis present

## 2019-06-13 DIAGNOSIS — K250 Acute gastric ulcer with hemorrhage: Secondary | ICD-10-CM | POA: Diagnosis not present

## 2019-06-13 DIAGNOSIS — K2970 Gastritis, unspecified, without bleeding: Secondary | ICD-10-CM | POA: Diagnosis present

## 2019-06-13 DIAGNOSIS — R34 Anuria and oliguria: Secondary | ICD-10-CM | POA: Diagnosis not present

## 2019-06-13 DIAGNOSIS — E785 Hyperlipidemia, unspecified: Secondary | ICD-10-CM | POA: Diagnosis present

## 2019-06-13 DIAGNOSIS — L03114 Cellulitis of left upper limb: Secondary | ICD-10-CM | POA: Diagnosis not present

## 2019-06-13 DIAGNOSIS — K592 Neurogenic bowel, not elsewhere classified: Secondary | ICD-10-CM | POA: Diagnosis not present

## 2019-06-13 DIAGNOSIS — I6389 Other cerebral infarction: Secondary | ICD-10-CM

## 2019-06-13 DIAGNOSIS — G9511 Acute infarction of spinal cord (embolic) (nonembolic): Secondary | ICD-10-CM | POA: Diagnosis not present

## 2019-06-13 DIAGNOSIS — M4714 Other spondylosis with myelopathy, thoracic region: Secondary | ICD-10-CM | POA: Diagnosis not present

## 2019-06-13 DIAGNOSIS — K921 Melena: Secondary | ICD-10-CM | POA: Diagnosis not present

## 2019-06-13 DIAGNOSIS — B9561 Methicillin susceptible Staphylococcus aureus infection as the cause of diseases classified elsewhere: Secondary | ICD-10-CM | POA: Diagnosis present

## 2019-06-13 DIAGNOSIS — N319 Neuromuscular dysfunction of bladder, unspecified: Secondary | ICD-10-CM | POA: Diagnosis present

## 2019-06-13 DIAGNOSIS — B1920 Unspecified viral hepatitis C without hepatic coma: Secondary | ICD-10-CM | POA: Diagnosis present

## 2019-06-13 DIAGNOSIS — E8889 Other specified metabolic disorders: Secondary | ICD-10-CM | POA: Diagnosis not present

## 2019-06-13 DIAGNOSIS — M47812 Spondylosis without myelopathy or radiculopathy, cervical region: Secondary | ICD-10-CM | POA: Diagnosis present

## 2019-06-13 DIAGNOSIS — K6289 Other specified diseases of anus and rectum: Secondary | ICD-10-CM

## 2019-06-13 DIAGNOSIS — R918 Other nonspecific abnormal finding of lung field: Secondary | ICD-10-CM | POA: Diagnosis present

## 2019-06-13 DIAGNOSIS — K626 Ulcer of anus and rectum: Secondary | ICD-10-CM | POA: Diagnosis present

## 2019-06-13 DIAGNOSIS — Z95828 Presence of other vascular implants and grafts: Secondary | ICD-10-CM

## 2019-06-13 DIAGNOSIS — I169 Hypertensive crisis, unspecified: Secondary | ICD-10-CM | POA: Diagnosis not present

## 2019-06-13 DIAGNOSIS — I440 Atrioventricular block, first degree: Secondary | ICD-10-CM | POA: Diagnosis present

## 2019-06-13 DIAGNOSIS — F1290 Cannabis use, unspecified, uncomplicated: Secondary | ICD-10-CM | POA: Diagnosis present

## 2019-06-13 DIAGNOSIS — R6520 Severe sepsis without septic shock: Secondary | ICD-10-CM | POA: Diagnosis not present

## 2019-06-13 DIAGNOSIS — J189 Pneumonia, unspecified organism: Secondary | ICD-10-CM | POA: Diagnosis present

## 2019-06-13 DIAGNOSIS — I714 Abdominal aortic aneurysm, without rupture: Secondary | ICD-10-CM | POA: Diagnosis present

## 2019-06-13 DIAGNOSIS — M4802 Spinal stenosis, cervical region: Secondary | ICD-10-CM | POA: Diagnosis present

## 2019-06-13 DIAGNOSIS — D62 Acute posthemorrhagic anemia: Secondary | ICD-10-CM | POA: Diagnosis present

## 2019-06-13 DIAGNOSIS — K259 Gastric ulcer, unspecified as acute or chronic, without hemorrhage or perforation: Secondary | ICD-10-CM | POA: Diagnosis present

## 2019-06-13 DIAGNOSIS — N419 Inflammatory disease of prostate, unspecified: Secondary | ICD-10-CM | POA: Diagnosis present

## 2019-06-13 DIAGNOSIS — N186 End stage renal disease: Secondary | ICD-10-CM | POA: Diagnosis present

## 2019-06-13 DIAGNOSIS — D649 Anemia, unspecified: Secondary | ICD-10-CM

## 2019-06-13 DIAGNOSIS — I441 Atrioventricular block, second degree: Secondary | ICD-10-CM | POA: Diagnosis present

## 2019-06-13 DIAGNOSIS — M7989 Other specified soft tissue disorders: Secondary | ICD-10-CM | POA: Diagnosis present

## 2019-06-13 DIAGNOSIS — Z8673 Personal history of transient ischemic attack (TIA), and cerebral infarction without residual deficits: Secondary | ICD-10-CM

## 2019-06-13 DIAGNOSIS — H409 Unspecified glaucoma: Secondary | ICD-10-CM | POA: Diagnosis present

## 2019-06-13 DIAGNOSIS — R159 Full incontinence of feces: Secondary | ICD-10-CM | POA: Diagnosis present

## 2019-06-13 DIAGNOSIS — Z79899 Other long term (current) drug therapy: Secondary | ICD-10-CM

## 2019-06-13 DIAGNOSIS — Z885 Allergy status to narcotic agent status: Secondary | ICD-10-CM

## 2019-06-13 DIAGNOSIS — M4626 Osteomyelitis of vertebra, lumbar region: Secondary | ICD-10-CM | POA: Diagnosis not present

## 2019-06-13 DIAGNOSIS — K3189 Other diseases of stomach and duodenum: Secondary | ICD-10-CM | POA: Diagnosis not present

## 2019-06-13 DIAGNOSIS — R05 Cough: Secondary | ICD-10-CM | POA: Diagnosis not present

## 2019-06-13 DIAGNOSIS — D500 Iron deficiency anemia secondary to blood loss (chronic): Secondary | ICD-10-CM | POA: Diagnosis not present

## 2019-06-13 DIAGNOSIS — B370 Candidal stomatitis: Secondary | ICD-10-CM | POA: Diagnosis not present

## 2019-06-13 DIAGNOSIS — J9601 Acute respiratory failure with hypoxia: Secondary | ICD-10-CM | POA: Diagnosis not present

## 2019-06-13 DIAGNOSIS — D72829 Elevated white blood cell count, unspecified: Secondary | ICD-10-CM | POA: Diagnosis not present

## 2019-06-13 DIAGNOSIS — Z8619 Personal history of other infectious and parasitic diseases: Secondary | ICD-10-CM | POA: Diagnosis not present

## 2019-06-13 DIAGNOSIS — N2581 Secondary hyperparathyroidism of renal origin: Secondary | ICD-10-CM | POA: Diagnosis not present

## 2019-06-13 DIAGNOSIS — D696 Thrombocytopenia, unspecified: Secondary | ICD-10-CM | POA: Diagnosis not present

## 2019-06-13 DIAGNOSIS — R609 Edema, unspecified: Secondary | ICD-10-CM | POA: Diagnosis not present

## 2019-06-13 DIAGNOSIS — I10 Essential (primary) hypertension: Secondary | ICD-10-CM | POA: Diagnosis not present

## 2019-06-13 DIAGNOSIS — R079 Chest pain, unspecified: Secondary | ICD-10-CM | POA: Diagnosis not present

## 2019-06-13 DIAGNOSIS — F17210 Nicotine dependence, cigarettes, uncomplicated: Secondary | ICD-10-CM | POA: Diagnosis not present

## 2019-06-13 DIAGNOSIS — R0602 Shortness of breath: Secondary | ICD-10-CM

## 2019-06-13 DIAGNOSIS — M792 Neuralgia and neuritis, unspecified: Secondary | ICD-10-CM | POA: Diagnosis not present

## 2019-06-13 DIAGNOSIS — B9681 Helicobacter pylori [H. pylori] as the cause of diseases classified elsewhere: Secondary | ICD-10-CM | POA: Diagnosis present

## 2019-06-13 DIAGNOSIS — K625 Hemorrhage of anus and rectum: Secondary | ICD-10-CM | POA: Diagnosis present

## 2019-06-13 DIAGNOSIS — D631 Anemia in chronic kidney disease: Secondary | ICD-10-CM | POA: Diagnosis present

## 2019-06-13 DIAGNOSIS — R7881 Bacteremia: Secondary | ICD-10-CM | POA: Diagnosis not present

## 2019-06-13 DIAGNOSIS — Z992 Dependence on renal dialysis: Secondary | ICD-10-CM | POA: Diagnosis not present

## 2019-06-13 DIAGNOSIS — J9811 Atelectasis: Secondary | ICD-10-CM | POA: Diagnosis not present

## 2019-06-13 DIAGNOSIS — Z8249 Family history of ischemic heart disease and other diseases of the circulatory system: Secondary | ICD-10-CM

## 2019-06-13 DIAGNOSIS — R109 Unspecified abdominal pain: Secondary | ICD-10-CM

## 2019-06-13 DIAGNOSIS — R531 Weakness: Secondary | ICD-10-CM | POA: Diagnosis not present

## 2019-06-13 DIAGNOSIS — F17200 Nicotine dependence, unspecified, uncomplicated: Secondary | ICD-10-CM | POA: Diagnosis not present

## 2019-06-13 DIAGNOSIS — I7101 Dissection of thoracic aorta: Secondary | ICD-10-CM | POA: Diagnosis not present

## 2019-06-13 DIAGNOSIS — K5641 Fecal impaction: Secondary | ICD-10-CM | POA: Diagnosis present

## 2019-06-13 DIAGNOSIS — I160 Hypertensive urgency: Secondary | ICD-10-CM | POA: Diagnosis not present

## 2019-06-13 LAB — CBC
HCT: 27.8 % — ABNORMAL LOW (ref 39.0–52.0)
Hemoglobin: 9.2 g/dL — ABNORMAL LOW (ref 13.0–17.0)
MCH: 31.2 pg (ref 26.0–34.0)
MCHC: 33.1 g/dL (ref 30.0–36.0)
MCV: 94.2 fL (ref 80.0–100.0)
Platelets: 324 10*3/uL (ref 150–400)
RBC: 2.95 MIL/uL — ABNORMAL LOW (ref 4.22–5.81)
RDW: 16.7 % — ABNORMAL HIGH (ref 11.5–15.5)
WBC: 17.6 10*3/uL — ABNORMAL HIGH (ref 4.0–10.5)
nRBC: 0 % (ref 0.0–0.2)

## 2019-06-13 LAB — GLUCOSE, CAPILLARY
Glucose-Capillary: 88 mg/dL (ref 70–99)
Glucose-Capillary: 93 mg/dL (ref 70–99)
Glucose-Capillary: 96 mg/dL (ref 70–99)

## 2019-06-13 LAB — OCCULT BLOOD X 1 CARD TO LAB, STOOL: Fecal Occult Bld: NEGATIVE

## 2019-06-13 LAB — TYPE AND SCREEN
ABO/RH(D): O POS
Antibody Screen: NEGATIVE

## 2019-06-13 MED ORDER — LOSARTAN POTASSIUM 50 MG PO TABS
100.0000 mg | ORAL_TABLET | Freq: Every day | ORAL | Status: DC
  Administered 2019-06-13 – 2019-06-14 (×2): 100 mg via ORAL
  Filled 2019-06-13 (×2): qty 2

## 2019-06-13 MED ORDER — FENTANYL CITRATE (PF) 100 MCG/2ML IJ SOLN
25.0000 ug | INTRAMUSCULAR | Status: DC | PRN
  Administered 2019-06-13 – 2019-06-21 (×12): 25 ug via INTRAVENOUS
  Filled 2019-06-13 (×12): qty 2

## 2019-06-13 MED ORDER — VANCOMYCIN HCL 10 G IV SOLR
2000.0000 mg | Freq: Once | INTRAVENOUS | Status: AC
  Administered 2019-06-13: 2000 mg via INTRAVENOUS
  Filled 2019-06-13: qty 2000

## 2019-06-13 MED ORDER — ACETAMINOPHEN 325 MG PO TABS
650.0000 mg | ORAL_TABLET | Freq: Four times a day (QID) | ORAL | Status: DC | PRN
  Administered 2019-06-13 – 2019-07-01 (×14): 650 mg via ORAL
  Filled 2019-06-13 (×14): qty 2

## 2019-06-13 MED ORDER — AMLODIPINE BESYLATE 10 MG PO TABS
10.0000 mg | ORAL_TABLET | Freq: Every day | ORAL | Status: DC
  Administered 2019-06-13: 10 mg via ORAL
  Filled 2019-06-13 (×2): qty 1

## 2019-06-13 MED ORDER — VANCOMYCIN HCL IN DEXTROSE 1-5 GM/200ML-% IV SOLN
1000.0000 mg | INTRAVENOUS | Status: DC
  Administered 2019-06-14: 09:00:00 1000 mg via INTRAVENOUS
  Filled 2019-06-13 (×2): qty 200

## 2019-06-13 MED ORDER — ONDANSETRON HCL 4 MG PO TABS
4.0000 mg | ORAL_TABLET | Freq: Four times a day (QID) | ORAL | Status: DC | PRN
  Administered 2019-06-16 – 2019-06-17 (×2): 4 mg via ORAL
  Filled 2019-06-13 (×2): qty 1

## 2019-06-13 MED ORDER — ACETAMINOPHEN 650 MG RE SUPP: 650.0000 mg | Freq: Four times a day (QID) | RECTAL | Status: DC | PRN

## 2019-06-13 MED ORDER — FERRIC CITRATE 1 GM 210 MG(FE) PO TABS: 420.0000 mg | ORAL_TABLET | ORAL | Status: DC

## 2019-06-13 MED ORDER — SODIUM CHLORIDE 0.9 % IV SOLN
2.0000 g | Freq: Once | INTRAVENOUS | Status: AC
  Administered 2019-06-13: 2 g via INTRAVENOUS
  Filled 2019-06-13: qty 2

## 2019-06-13 MED ORDER — HYDRALAZINE HCL 50 MG PO TABS
100.0000 mg | ORAL_TABLET | Freq: Three times a day (TID) | ORAL | Status: DC
  Administered 2019-06-13 – 2019-06-22 (×20): 100 mg via ORAL
  Filled 2019-06-13 (×23): qty 2

## 2019-06-13 MED ORDER — LABETALOL HCL 300 MG PO TABS
300.0000 mg | ORAL_TABLET | Freq: Two times a day (BID) | ORAL | Status: DC
  Administered 2019-06-13 – 2019-06-22 (×13): 300 mg via ORAL
  Filled 2019-06-13 (×16): qty 1

## 2019-06-13 MED ORDER — MORPHINE SULFATE (PF) 2 MG/ML IV SOLN: 2.0000 mg | INTRAVENOUS | Status: DC | PRN

## 2019-06-13 MED ORDER — ALBUTEROL SULFATE (2.5 MG/3ML) 0.083% IN NEBU: 2.5000 mg | INHALATION_SOLUTION | RESPIRATORY_TRACT | Status: DC | PRN

## 2019-06-13 MED ORDER — CHLORHEXIDINE GLUCONATE CLOTH 2 % EX PADS
6.0000 | MEDICATED_PAD | Freq: Every day | CUTANEOUS | Status: DC
  Administered 2019-06-15: 6 via TOPICAL

## 2019-06-13 MED ORDER — MINOXIDIL 2.5 MG PO TABS
5.0000 mg | ORAL_TABLET | Freq: Two times a day (BID) | ORAL | Status: DC
  Administered 2019-06-13 – 2019-06-14 (×2): 5 mg via ORAL
  Filled 2019-06-13 (×5): qty 2

## 2019-06-13 MED ORDER — SODIUM CHLORIDE 0.9% FLUSH
3.0000 mL | Freq: Two times a day (BID) | INTRAVENOUS | Status: DC
  Administered 2019-06-13 – 2019-06-24 (×17): 3 mL via INTRAVENOUS
  Administered 2019-06-25: 2 mL via INTRAVENOUS
  Administered 2019-06-25 – 2019-07-02 (×12): 3 mL via INTRAVENOUS

## 2019-06-13 MED ORDER — ONDANSETRON HCL 4 MG/2ML IJ SOLN
4.0000 mg | Freq: Four times a day (QID) | INTRAMUSCULAR | Status: DC | PRN
  Administered 2019-06-13: 4 mg via INTRAVENOUS
  Filled 2019-06-13 (×2): qty 2

## 2019-06-13 MED ORDER — FERRIC CITRATE 1 GM 210 MG(FE) PO TABS
630.0000 mg | ORAL_TABLET | Freq: Three times a day (TID) | ORAL | Status: DC
  Administered 2019-06-14 – 2019-06-22 (×14): 630 mg via ORAL
  Filled 2019-06-13 (×20): qty 3

## 2019-06-13 MED ORDER — SODIUM CHLORIDE 0.9 % IV SOLN
2.0000 g | INTRAVENOUS | Status: DC
  Administered 2019-06-14 – 2019-07-01 (×8): 2 g via INTRAVENOUS
  Filled 2019-06-13 (×10): qty 2
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
## 2019-06-13 NOTE — Progress Notes (Signed)
NEW ADMISSION NOTE New Admission Note:   Arrival Method: [facility] via stretcher  Mental Orientation: axox4 Telemetry: no orders  Assessment: Completed Skin: IV: left hand  Pain: 7/10 pt states generalized pain Tubes:Right HD cath  Safety Measures: Safety Fall Prevention Plan has been discussed. Admission: Completed [facility] Orientation: Patient has been orientated to the room, unit and staff.  Family: none at the bedside.  Pt is a direct admit, admitting MD Tamala Julian, awaiting orders.  Orders have been reviewed and implemented. Will continue to monitor the patient. Call light has been placed within reach and bed alarm has been activated.   Paulla Fore, RN

## 2019-06-13 NOTE — Progress Notes (Signed)
Pharmacy Antibiotic Note  Frank Fuller is a 58 y.o. male here with sepsis.  He recently had TEVAR and was discharged home. Pharmacy has been consulted for vancomycin and cefepime dosing. He has ESRD on HD TTS. Tm 99.1, WBC were normal yesterday. Last HD was 8/20.  Plan: - Vancomycin 2000 mg IV load then 1000 mg after HD on TTS - Cefepime 2 g IV load then 2 g after HD on TTS - F/U HD schedule     Temp (24hrs), Avg:99.1 F (37.3 C), Min:99.1 F (37.3 C), Max:99.1 F (37.3 C)  Recent Labs  Lab 06/08/19 0511 06/09/19 0345 06/10/19 0319 06/11/19 0352 06/12/19 0312  WBC 15.6* 14.8* 11.5* 8.7 8.9  CREATININE 7.17* 10.09* 7.90* 6.89* 9.29*    Estimated Creatinine Clearance: 10.4 mL/min (A) (by C-G formula based on SCr of 9.29 mg/dL (H)).    Allergies  Allergen Reactions  . Oxycodone Nausea Only  . Chlorhexidine Other (See Comments)    Unknown reaction Patch skin test done at dialysis 06/26/17  - staff using clear dressing and alcohol to clean exit site of catheter  . Clonidine Derivatives Other (See Comments)    Dizziness   . Lisinopril Other (See Comments)    unresponsive  . Carvedilol Rash    Thank you for involving pharmacy in this patient's care.  Renold Genta, PharmD, BCPS Clinical Pharmacist Clinical phone for 06/13/2019 until 21:30p is [phone] 06/13/2019 3:19 PM  **Pharmacist phone directory can be found on [email or web address] listed under [place]**

## 2019-06-13 NOTE — Progress Notes (Signed)
[facility] Progress Note  Subjective: Patient just dc'd yesterday and now returns w/ fevers and pain "all over".  Asked to see for ESRD.  Patient notes pain in abd/ rectum, back, arms , legs. Poor historian.  No c/o SOB.    There were no vitals filed for this visit.  Inpatient medications: . amLODipine  10 mg Oral QHS  . ferric citrate  420 mg Oral With snacks  . ferric citrate  630 mg Oral TID WC  . hydrALAZINE  100 mg Oral TID  . labetalol  300 mg Oral BID  . losartan  100 mg Oral Daily  . minoxidil  5 mg Oral BID  . sodium chloride flush  3 mL Intravenous Q12H   . ceFEPime (MAXIPIME) IV    . [START ON 06/14/2019] ceFEPime (MAXIPIME) IV    . vancomycin    . [START ON 06/14/2019] vancomycin     acetaminophen **OR** acetaminophen, albuterol, fentaNYL (SUBLIMAZE) injection, ondansetron **OR** ondansetron (ZOFRAN) IV    Exam: General: nasal O2, no distress, drowsy Heart: RRR Lungs: clear anteriorly Extremities: no LE edema, bilat mild UE edema, no LE edema Dialysis Access:  [facility] c/d/i Neuro: conversant, NF      Dialysis: [facility]  4h 68min  400/1.5  87.5kg  2/2.25  RIJ TDC  Hep 3000  parsabiv 2.5ug tiw w/ HD  calcitriol 1.25 ug tiw w/ HD   Assessment/ Plan: 1.  Fevers: in patient who just had aortic stent graft and has indwelling HD catheter. Cx's sent. Abd CT showing prostate inflammation reportedly. Per primary team.  2. Type B acute aortic dissection/ chest pain/ hypertensive- s/p stent graft repair 8/14. Goal SBP per VVS 120- 170. Pt has hx severe chron HTN.  3.  L arm cellulitis: rec'd IV Ancef for this on recent admit 4. ESRD:HD TTS schedule. HD tomorrow 5. 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 He is 6 kg up today and knows not to drink a lot tonight. No resp issues fortunately.  4. Anemia:Hemoglobin 10, on mircera as an outpatient.  5. Metabolic bone disease:Calcium 9.4. Phos6.3. Will continue calcitriol. Parsabiv not  available inpatient- follow calcium. Continue auryxia. 6. Nutrition:Renal diet with fluid restrictions recommended  7. Recent MSSA bacteremia/ AVG infection: had infected AVG and had revision in 5/20, then partial resection/ revision in 6/20, then all AVG material (x2) removed 6/30.S/p 6 weeks of ancef IV completed     [facility] 06/11/2019, 4:18 PM  Iron/TIBC/Ferritin/ %Sat    Component Value Date/Time   IRON 38 (L) 04/22/2019 1130   TIBC 211 (L) 04/22/2019 1130   FERRITIN 305 08/03/2017 1028   IRONPCTSAT 18 04/22/2019 1130   Recent Labs  Lab 06/07/19 0333  06/12/19 0312  NA 134*   < > 132*  K 5.1   < > 4.1  CL 93*   < > 94*  CO2 22   < > 24  GLUCOSE 112*   < > 105*  BUN 43*   < > 55*  CREATININE 10.38*   < > 9.29*  CALCIUM 9.3   < > 9.1  PHOS 6.3*  --   --   ALBUMIN 2.9*  --   --    < > = values in this interval not displayed.   No results for input(s): AST, ALT, ALKPHOS, BILITOT, PROT in the last 168 hours. Recent Labs  Lab 06/12/19 0312  WBC 8.9  HGB 8.7*  HCT 26.2*  PLT 339

## 2019-06-14 LAB — CBC
HCT: 24.8 % — ABNORMAL LOW (ref 39.0–52.0)
Hemoglobin: 8 g/dL — ABNORMAL LOW (ref 13.0–17.0)
MCH: 31.1 pg (ref 26.0–34.0)
MCHC: 32.3 g/dL (ref 30.0–36.0)
MCV: 96.5 fL (ref 80.0–100.0)
Platelets: 321 K/uL (ref 150–400)
RBC: 2.57 MIL/uL — ABNORMAL LOW (ref 4.22–5.81)
RDW: 16.9 % — ABNORMAL HIGH (ref 11.5–15.5)
WBC: 21.2 K/uL — ABNORMAL HIGH (ref 4.0–10.5)
nRBC: 0 % (ref 0.0–0.2)

## 2019-06-14 LAB — GLUCOSE, CAPILLARY
Glucose-Capillary: 85 mg/dL (ref 70–99)
Glucose-Capillary: 90 mg/dL (ref 70–99)
Glucose-Capillary: 133 mg/dL — ABNORMAL HIGH (ref 70–99)
Glucose-Capillary: 122 mg/dL — ABNORMAL HIGH (ref 70–99)

## 2019-06-14 LAB — BASIC METABOLIC PANEL
Anion gap: 17 — ABNORMAL HIGH (ref 5–15)
BUN: 50 mg/dL — ABNORMAL HIGH (ref 6–20)
CO2: 22 mmol/L (ref 22–32)
Calcium: 8.6 mg/dL — ABNORMAL LOW (ref 8.9–10.3)
Chloride: 91 mmol/L — ABNORMAL LOW (ref 98–111)
Creatinine, Ser: 9.69 mg/dL — ABNORMAL HIGH (ref 0.61–1.24)
GFR calc Af Amer: 6 mL/min — ABNORMAL LOW (ref >60)
GFR calc non Af Amer: 5 mL/min — ABNORMAL LOW (ref >60)
Glucose, Bld: 85 mg/dL (ref 70–99)
Potassium: 4.5 mmol/L (ref 3.5–5.1)
Sodium: 130 mmol/L — ABNORMAL LOW (ref 135–145)

## 2019-06-14 LAB — HIV ANTIBODY (ROUTINE TESTING W REFLEX): HIV Screen 4th Generation wRfx: NONREACTIVE

## 2019-06-14 MED ORDER — HEPARIN SODIUM (PORCINE) 1000 UNIT/ML IJ SOLN
INTRAMUSCULAR | Status: AC
  Administered 2019-06-14: 3800 [IU] via ARTERIOVENOUS_FISTULA
  Filled 2019-06-14: qty 4

## 2019-06-14 MED ORDER — VANCOMYCIN HCL IN DEXTROSE 1-5 GM/200ML-% IV SOLN
INTRAVENOUS | Status: AC
  Administered 2019-06-14: 1000 mg via INTRAVENOUS
  Filled 2019-06-14: qty 200

## 2019-06-14 MED ORDER — DARBEPOETIN ALFA 100 MCG/0.5ML IJ SOSY
100.0000 ug | PREFILLED_SYRINGE | INTRAMUSCULAR | Status: DC
  Administered 2019-06-14 – 2019-06-21 (×2): 100 ug via INTRAVENOUS
  Filled 2019-06-14 (×2): qty 0.5

## 2019-06-14 MED ORDER — DARBEPOETIN ALFA 100 MCG/0.5ML IJ SOSY
PREFILLED_SYRINGE | INTRAMUSCULAR | Status: AC
  Administered 2019-06-14: 100 ug via INTRAVENOUS
  Filled 2019-06-14: qty 0.5

## 2019-06-14 MED ORDER — ACETAMINOPHEN 325 MG PO TABS
325.0000 mg | ORAL_TABLET | Freq: Once | ORAL | Status: AC
  Administered 2019-06-14: 325 mg via ORAL
  Filled 2019-06-14: qty 1

## 2019-06-14 NOTE — Progress Notes (Signed)
Pt vital signs at 2000 temp 101.1 , BP 133/69  ,resp 19 , HR  95  tylenol 650 mg po given , 23:49 vs temp 101.1 , BP 98/66 , resp 17 , HR 74 , 97% sats on 2lit [place] Bodenheimer NP notified see physicians orders will continue to monitor

## 2019-06-14 NOTE — Progress Notes (Addendum)
  [facility] Progress Note   Subjective:  Seen on HD - 4L UF goal, tolerating. Denies CP, dyspnea. Afebrile currently.  Objective Vitals:   06/14/19 0715 06/14/19 0720 06/14/19 0725 06/14/19 0730  BP: (!) 138/56 113/62  100/62  Pulse: 94 96 96 95  Resp: 14 13  13   Temp: 99.1 F (37.3 C)     TempSrc: Oral     SpO2: 96%     Weight: 94.8 kg      Physical Exam General: Ill appearing man, NAD Heart: RRR; no murmur Lungs: CTA anteriorly Abdomen: soft, non-tender Extremities: No LE edema; some LUE edema Dialysis Access: [facility]  Additional Objective Labs: Basic Metabolic Panel: Recent Labs  Lab 06/11/19 0352 06/12/19 0312 06/14/19 0449  NA 135 132* 130*  K 4.1 4.1 4.5  CL 95* 94* 91*  CO2 25 24 22   GLUCOSE 102* 105* 85  BUN 36* 55* 50*  CREATININE 6.89* 9.29* 9.69*  CALCIUM 9.2 9.1 8.6*   CBC: Recent Labs  Lab 06/10/19 0319 06/11/19 0352 06/12/19 0312 06/13/19 1601 06/14/19 0449  WBC 11.5* 8.7 8.9 17.6* 21.2*  HGB 8.7* 9.2* 8.7* 9.2* 8.0*  HCT 27.3* 28.5* 26.2* 27.8* 24.8*  MCV 96.1 96.0 94.2 94.2 96.5  PLT 317 357 339 324 321   Medications: . ceFEPime (MAXIPIME) IV    . vancomycin    . vancomycin     . amLODipine  10 mg Oral QHS  . Chlorhexidine Gluconate Cloth  6 each Topical Q0600  . ferric citrate  420 mg Oral With snacks  . ferric citrate  630 mg Oral TID WC  . hydrALAZINE  100 mg Oral TID  . labetalol  300 mg Oral BID  . losartan  100 mg Oral Daily  . minoxidil  5 mg Oral BID  . sodium chloride flush  3 mL Intravenous Q12H    Dialysis Orders: [facility] 4h 59min  400/1.5  87.5kg  2/2.25  [facility] TDC  Hep 3000 - Parsabiv 2.5mg  IV q HD - Calcitriol 1.78mcg PO q HD   Assessment/ Plan: 1.  Fever/Leukocytosis: S/p recent aortic stent and TDC. Possible pneumonia + proctitis on CT done at [facility]. BCx pending from [facility]. On Vanc/Cefepime. 2. Hx Type B acute aortic dissection (s/p stent graft repair 8/14): Goal SBP per VVS 120- 170.  Known severe resistant HTN.  3. ESRD: Continue HD per TTS schedule - HD today. 4. Hypertension/volume:Volume up - 4L UF goal today. 5.  Anemia:Hgb down to 8 - will give Aranesp 13mcg today. 6. Metabolic bone disease: Ca/Phos ok. Continue Auryxia, Calcitriol.Parsabiv not available inpatient- follow calcium.  7. Nutrition:Renal diet with fluid restrictions recommended  8.  Recent MSSA bacteremia/ AVG infection: Hx infected AVG and had revision in 5/20, then partial resection/ revision in 6/20, then all AVG material (x2) removed 6/30.S/p 6 weeks of ancef IV completed   Veneta Penton, PA-C 06/14/2019, 9:35 AM  [facility] Pager: [phone]  Pt seen, examined and agree w A/P as above.  [name]  MD 06/14/2019, 11:22 AM

## 2019-06-14 NOTE — Progress Notes (Signed)
Vital signs at 0230  BP 133/62  , HR 87,  temp 99.6  resp 18 will continue to monitor

## 2019-06-14 NOTE — Progress Notes (Signed)
PROGRESS NOTE    Frank Fuller  [id number] DOB: January 09, 1961 DOA: 06/13/2019 PCP: [facility]   Brief Narrative:  HPI on 06/13/2019 by Dr. Fuller Plan Frank Fuller is a 58 y.o. male with medical history significant of hypertension, ESRD on HD(T/Th/Sat) AAA s/p stent , HCV s/p treatment, depression, and CVA; who initially presented to [facility] with multiple complaints.  Complains of having aching pain of his abdomen, back, and chest.  Associated symptoms included constipation with last bowel movement 8 days ago, fever, nonproductive cough, and chills.  Patient just recently been hospitalized from 8/11-8/20 for dissection of the thoracic aorta treated with endovascular stentgraft with spinal drain by Dr. Oneida Alar on 8/14.  He had just been discharged home and patient immediately came back to the hospital.  He complained of having the urge to use restroom but being unable to do so.  His last hemodialysis session was on 8/20 prior to his discharge.  Upon arrival to [facility] patient was noted to be febrile up to 102.4 F, blood pressure 192/96, heart rates 107, and O2 saturations maintained on room air.  Labs significant for W BC 14.8-> 17.9, hemoglobin 10.2, sodium 132, potassium 3.6, BUN 32, creatinine 5.8, CRP 172.2, procalcitonin 3.04, and pro BNP 54500.  COVID-19 screening negative.  CT angiogram of the chest abdomen and pelvis was obtained.  It showed interval repair of the thoracic aortic dissection, slight increase in left pleural effusion, improved right lung nodular airspace disease likely infectious, generalized ileus with slow bowel transit, and large colonic stool burden with mild rectal wall thickening.  Patient received vancomycin, cefepime, morphine, fentanyl, and magnesium citrate.  Transfer was requested here at [facility]. Assessment & Plan   Sepsis possibly secondary to pneumonia/proctititis, questionable cellulitis -Present on admission, patient presented with  fever of 102.4 F, leukocytosis at outside facility -CRP and pro calcitonin were both elevated -CT noted right-sided pneumonia with ileus and proctitis -Nephrology noted left arm cellulitis -Cultures from outside facility pending -Patient placed on broad-spectrum antibiotics of vancomycin and cefepime -Continues to have fevers  Hypoxia -Patient was noted to have hypoxia, oxygen saturations of 87% on room air -Continue supplemental oxygen to maintain saturations above 90% -CT did show left pleural effusion, proBNP was elevated -Continue volume control with dialysis  Acute Illeus and proctitis -Noted on CT imaging at outside facility showing signs of ileus with large stool burden and inflammation of the rectum  -Patient reported feeling nauseous but denied any current vomiting -Currently n.p.o., will advance his diet slowly  Anemia of chronic disease - Hemoglobin currently 8 -FOBT negative -Continue iron supplementation -Continue to monitor  Left upper extremity swelling -Patient was evaluated with Doppler ultrasound of the left upper extremity during his last hospitalization and noted to be negative for any signs of a DVT. -Continue to monitor  Thoracic aortic dissection -Recent admission and discharge by vascular surgery with endovascular stent placement -Patient will need to follow-up with vascular surgery as an outpatient   ESRD -On hemodialysis on Tuesday, Thursday, Saturday -Nephrology consulted and appreciated  Essential hypertension -Stable, continue labetalol, hydralazine, amlodipine, losartan, and minoxidil  DVT Prophylaxis  SCDs  Code Status: Full  Family Communication: None at bedside  Disposition Plan: Admitted. Suspect home in 2-3 days.   Consultants Nephrology  Procedures  None  Antibiotics   Anti-infectives (From admission, onward)   Start     Dose/Rate Route Frequency Ordered Stop   06/14/19 1800  ceFEPIme (MAXIPIME) 2 g in sodium chloride  0.9 % 100 mL IVPB     2 g 200 mL/hr over 30 Minutes Intravenous Every T-Th-Sa (1800) 06/13/19 1540     06/14/19 1200  vancomycin (VANCOCIN) IVPB 1000 mg/200 mL premix     1,000 mg 200 mL/hr over 60 Minutes Intravenous Every T-Th-Sa (Hemodialysis) 06/13/19 1540     06/13/19 1545  vancomycin (VANCOCIN) 2,000 mg in sodium chloride 0.9 % 500 mL IVPB     2,000 mg 250 mL/hr over 120 Minutes Intravenous  Once 06/13/19 1519 06/13/19 1829   06/13/19 1545  ceFEPIme (MAXIPIME) 2 g in sodium chloride 0.9 % 100 mL IVPB     2 g 200 mL/hr over 30 Minutes Intravenous  Once 06/13/19 1519 06/13/19 2241      Subjective:   Frank Fuller seen and examined today.  Seen in dialysis.  Complains of some nausea.  Feels very tired today states he has not been able to rest well.  Currently denies chest pain, shortness of breath, abdominal pain, dizziness or headache.    Objective:   Vitals:   06/14/19 1000 06/14/19 1030 06/14/19 1040 06/14/19 1146  BP: 105/64 132/67 131/73 128/75  Pulse: 95 94 94 92  Resp: 14 13 16 18   Temp:   98.3 F (36.8 C) 98.2 F (36.8 C)  TempSrc:   Oral Oral  SpO2: 96% 96% 96% 98%  Weight:   91.6 kg     Intake/Output Summary (Last 24 hours) at 06/14/2019 1325 Last data filed at 06/14/2019 1040 Gross per 24 hour  Intake 0 ml  Output 2652 ml  Net -2652 ml   Filed Weights   06/13/19 2000 06/14/19 0715 06/14/19 1040  Weight: 95.5 kg 94.8 kg 91.6 kg    Exam  General: Well developed, chronically appearing, NAD  HEENT: NCAT, mucous membranes moist.   Neck: Supple  Cardiovascular: S1 S2 auscultated, no rubs, murmurs or gallops. Regular rate and rhythm.  Respiratory: Clear to auscultation bilaterally anteriorly  Abdomen: Soft, nontender, nondistended, + bowel sounds  Extremities: warm dry without cyanosis clubbing or edema of the lower extremities.  Mild LUE edema  Neuro: AAOx3, nonfocal  Psych: Normal affect and demeanor   Data Reviewed: I have personally  reviewed following labs and imaging studies  CBC: Recent Labs  Lab 06/10/19 0319 06/11/19 0352 06/12/19 0312 06/13/19 1601 06/14/19 0449  WBC 11.5* 8.7 8.9 17.6* 21.2*  HGB 8.7* 9.2* 8.7* 9.2* 8.0*  HCT 27.3* 28.5* 26.2* 27.8* 24.8*  MCV 96.1 96.0 94.2 94.2 96.5  PLT 317 357 339 324 [id number]   Basic Metabolic Panel: Recent Labs  Lab 06/09/19 0345 06/10/19 0319 06/11/19 0352 06/12/19 0312 06/14/19 0449  NA 134* 135 135 132* 130*  K 4.7 4.3 4.1 4.1 4.5  CL 95* 96* 95* 94* 91*  CO2 24 25 25 24 22   GLUCOSE 107* 104* 102* 105* 85  BUN 45* 36* 36* 55* 50*  CREATININE 10.09* 7.90* 6.89* 9.29* 9.69*  CALCIUM 9.0 8.9 9.2 9.1 8.6*   GFR: Estimated Creatinine Clearance: 10 mL/min (A) (by C-G formula based on SCr of 9.69 mg/dL (H)). Liver Function Tests: No results for input(s): AST, ALT, ALKPHOS, BILITOT, PROT, ALBUMIN in the last 168 hours. No results for input(s): LIPASE, AMYLASE in the last 168 hours. No results for input(s): AMMONIA in the last 168 hours. Coagulation Profile: No results for input(s): INR, PROTIME in the last 168 hours. Cardiac Enzymes: No results for input(s): CKTOTAL, CKMB, CKMBINDEX, TROPONINI in the last 168 hours. BNP (last 3  results) No results for input(s): PROBNP in the last 8760 hours. HbA1C: No results for input(s): HGBA1C in the last 72 hours. CBG: Recent Labs  Lab 06/13/19 1620 06/13/19 1957 06/13/19 2347 06/14/19 0403 06/14/19 1148  GLUCAP 96 93 88 85 90   Lipid Profile: No results for input(s): CHOL, HDL, LDLCALC, TRIG, CHOLHDL, LDLDIRECT in the last 72 hours. Thyroid Function Tests: No results for input(s): TSH, T4TOTAL, FREET4, T3FREE, THYROIDAB in the last 72 hours. Anemia Panel: No results for input(s): VITAMINB12, FOLATE, FERRITIN, TIBC, IRON, RETICCTPCT in the last 72 hours. Urine analysis:    Component Value Date/Time   COLORURINE YELLOW 08/04/2017 1824   APPEARANCEUR HAZY (A) 08/04/2017 1824   LABSPEC 1.009 08/04/2017 1824     PHURINE 9.0 (H) 08/04/2017 1824   GLUCOSEU 150 (A) 08/04/2017 1824   HGBUR NEGATIVE 08/04/2017 1824   BILIRUBINUR NEGATIVE 08/04/2017 1824   KETONESUR NEGATIVE 08/04/2017 1824   PROTEINUR >=300 (A) 08/04/2017 1824   NITRITE NEGATIVE 08/04/2017 1824   LEUKOCYTESUR NEGATIVE 08/04/2017 1824   Sepsis Labs: @LABRCNTIP (procalcitonin:4,lacticidven:4)  ) Recent Results (from the past 240 hour(s))  Surgical pcr screen     Status: None   Collection Time: 06/06/19  4:32 AM   Specimen: Nasal Mucosa; Nasal Swab  Result Value Ref Range Status   MRSA, PCR NEGATIVE NEGATIVE Final   Staphylococcus aureus NEGATIVE NEGATIVE Final    Comment: (NOTE) The Xpert SA Assay (FDA approved for NASAL specimens in patients 61 years of age and older), is one component of a comprehensive surveillance program. It is not intended to diagnose infection nor to guide or monitor treatment. Performed at [facility], [place] [address], [place], [place] [address]       Radiology Studies: No results found.   Scheduled Meds:  amLODipine  10 mg Oral QHS   Chlorhexidine Gluconate Cloth  6 each Topical Q0600   darbepoetin (ARANESP) injection - DIALYSIS  100 mcg Intravenous Q Sat-HD   ferric citrate  420 mg Oral With snacks   ferric citrate  630 mg Oral TID WC   hydrALAZINE  100 mg Oral TID   labetalol  300 mg Oral BID   losartan  100 mg Oral Daily   minoxidil  5 mg Oral BID   sodium chloride flush  3 mL Intravenous Q12H   Continuous Infusions:  ceFEPime (MAXIPIME) IV     vancomycin 1,000 mg (06/14/19 0920)     LOS: 1 day   Time Spent in minutes   30 minutes  Halyn Flaugher D.O. on 06/14/2019 at 1:25 PM  Between 7am to 7pm - Please see pager noted on [email or web address]  After 7pm go to [email or web address]  And look for the night coverage person covering for me after hours  [facility] Office  [phone]

## 2019-06-15 ENCOUNTER — Inpatient Hospital Stay (HOSPITAL_COMMUNITY): Payer: Medicare Other

## 2019-06-15 DIAGNOSIS — G8220 Paraplegia, unspecified: Secondary | ICD-10-CM

## 2019-06-15 LAB — CBC
HCT: 27.2 % — ABNORMAL LOW (ref 39.0–52.0)
Hemoglobin: 8.9 g/dL — ABNORMAL LOW (ref 13.0–17.0)
MCH: 31.2 pg (ref 26.0–34.0)
MCHC: 32.7 g/dL (ref 30.0–36.0)
MCV: 95.4 fL (ref 80.0–100.0)
Platelets: 364 10*3/uL (ref 150–400)
RBC: 2.85 MIL/uL — ABNORMAL LOW (ref 4.22–5.81)
RDW: 16.8 % — ABNORMAL HIGH (ref 11.5–15.5)
WBC: 26.1 10*3/uL — ABNORMAL HIGH (ref 4.0–10.5)
nRBC: 0 % (ref 0.0–0.2)

## 2019-06-15 LAB — RENAL FUNCTION PANEL
Albumin: 2 g/dL — ABNORMAL LOW (ref 3.5–5.0)
Anion gap: 16 — ABNORMAL HIGH (ref 5–15)
BUN: 38 mg/dL — ABNORMAL HIGH (ref 6–20)
CO2: 24 mmol/L (ref 22–32)
Calcium: 9.1 mg/dL (ref 8.9–10.3)
Chloride: 92 mmol/L — ABNORMAL LOW (ref 98–111)
Creatinine, Ser: 7.81 mg/dL — ABNORMAL HIGH (ref 0.61–1.24)
GFR calc Af Amer: 8 mL/min — ABNORMAL LOW (ref >60)
GFR calc non Af Amer: 7 mL/min — ABNORMAL LOW (ref >60)
Glucose, Bld: 108 mg/dL — ABNORMAL HIGH (ref 70–99)
Phosphorus: 5.2 mg/dL — ABNORMAL HIGH (ref 2.5–4.6)
Potassium: 4.5 mmol/L (ref 3.5–5.1)
Sodium: 132 mmol/L — ABNORMAL LOW (ref 135–145)

## 2019-06-15 LAB — GLUCOSE, CAPILLARY
Glucose-Capillary: 101 mg/dL — ABNORMAL HIGH (ref 70–99)
Glucose-Capillary: 109 mg/dL — ABNORMAL HIGH (ref 70–99)
Glucose-Capillary: 113 mg/dL — ABNORMAL HIGH (ref 70–99)
Glucose-Capillary: 121 mg/dL — ABNORMAL HIGH (ref 70–99)
Glucose-Capillary: 127 mg/dL — ABNORMAL HIGH (ref 70–99)
Glucose-Capillary: 97 mg/dL (ref 70–99)

## 2019-06-15 MED ORDER — LOSARTAN POTASSIUM 50 MG PO TABS
50.0000 mg | ORAL_TABLET | Freq: Two times a day (BID) | ORAL | Status: DC
  Administered 2019-06-15 – 2019-06-22 (×10): 50 mg via ORAL
  Filled 2019-06-15 (×13): qty 1

## 2019-06-15 MED ORDER — MINOXIDIL 2.5 MG PO TABS
2.5000 mg | ORAL_TABLET | Freq: Two times a day (BID) | ORAL | Status: DC
  Administered 2019-06-15 – 2019-06-22 (×10): 2.5 mg via ORAL
  Filled 2019-06-15 (×15): qty 1

## 2019-06-15 MED ORDER — AMLODIPINE BESYLATE 5 MG PO TABS
5.0000 mg | ORAL_TABLET | Freq: Every day | ORAL | Status: DC
  Administered 2019-06-15 – 2019-06-19 (×4): 5 mg via ORAL
  Filled 2019-06-15 (×5): qty 1

## 2019-06-15 NOTE — Consult Note (Addendum)
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 which I think is likely explanation for his presentation.  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  Recommendations PT OT  when stable Management of complications such as ileus, urinary retention Compressive cervical myelopathy: Consult neurosurgery, however given his current situation not be a surgical candidate unless he makes significant clinical improvement.   Alizae Bechtel [facility] Pager Number [phone]

## 2019-06-15 NOTE — Progress Notes (Addendum)
[facility] Progress Note   Subjective:  Seen in room - upset and tearful today. Tells me that can't move his legs, although can actually move them on command. Per Dr. Jonnie Finner, he had a similar episode after his aorta repair in ICU when BP dropped low. WBC still up. Had an episode of stool incontinence this morning. Hurts all over.  Objective Vitals:   06/14/19 1623 06/14/19 2014 06/15/19 0423 06/15/19 0900  BP: (!) 105/57 97/64 121/63 118/64  Pulse: 91 99 78 80  Resp: 18 18 18 20   Temp: 100 F (37.8 C) 100.3 F (37.9 C) 98.9 F (37.2 C) 98.6 F (37 C)  TempSrc: Oral Oral Oral Oral  SpO2: 97% 99% 100% 100%  Weight:  91.6 kg     Physical Exam General: Ill appearing man, upset/tearful this morning. Heart: RRR; no murmur Lungs: CTA anteriorly Abdomen: soft, mild generalized tenderness without guarding Extremities: No LE edema; 1+ LUE edema with warmth. Can push against my hand with feet Dialysis Access: [facility]  Additional Objective Labs: Basic Metabolic Panel: Recent Labs  Lab 06/12/19 0312 06/14/19 0449 06/15/19 0619  NA 132* 130* 132*  K 4.1 4.5 4.5  CL 94* 91* 92*  CO2 24 22 24   GLUCOSE 105* 85 108*  BUN 55* 50* 38*  CREATININE 9.29* 9.69* 7.81*  CALCIUM 9.1 8.6* 9.1  PHOS  --   --  5.2*   Liver Function Tests: Recent Labs  Lab 06/15/19 0619  ALBUMIN 2.0*   CBC: Recent Labs  Lab 06/11/19 0352 06/12/19 0312 06/13/19 1601 06/14/19 0449 06/15/19 0619  WBC 8.7 8.9 17.6* 21.2* 26.1*  HGB 9.2* 8.7* 9.2* 8.0* 8.9*  HCT 28.5* 26.2* 27.8* 24.8* 27.2*  MCV 96.0 94.2 94.2 96.5 95.4  PLT 357 339 324 321 364   CBG: Recent Labs  Lab 06/14/19 1620 06/14/19 2013 06/15/19 0007 06/15/19 0422 06/15/19 0714  GLUCAP 133* 122* 113* 101* 97   Medications: . ceFEPime (MAXIPIME) IV 2 g (06/14/19 1810)  . vancomycin 1,000 mg (06/14/19 0920)   . amLODipine  5 mg Oral QHS  . Chlorhexidine Gluconate Cloth  6 each Topical Q0600  . darbepoetin  (ARANESP) injection - DIALYSIS  100 mcg Intravenous Q Sat-HD  . ferric citrate  420 mg Oral With snacks  . ferric citrate  630 mg Oral TID WC  . hydrALAZINE  100 mg Oral TID  . labetalol  300 mg Oral BID  . losartan  50 mg Oral BID  . minoxidil  2.5 mg Oral BID  . sodium chloride flush  3 mL Intravenous Q12H    Dialysis Orders: [facility] 4h 63min 400/1.5 87.5kg 2/2.25 RIJ TDC Hep 3000 - Parsabiv 2.5mg  IV q HD - Calcitriol 1.68mcg PO q HD   Assessment/ Plan: 1. Fever/Leukocytosis: S/p recent aortic stent and TDC. Possible pneumonia + proctitis on CT done at [facility]. BCx pending from [place]. On Vanc/Cefepime.  2. Hx Type B acute aortic dissection (s/p stent graft repair 8/14):Goal SBP per VVS 120- 170. Known severe resistant HTN.BP prob too low today for now - order for BP parameters placed. 3. ESRD: Continue HD per TTS schedule - next 8/25. 4. Hypertension/volume:Volume improving. See above, BP likely a little to low today. 5.  Anemia:Hgb 8.9 - s/p Aranesp 17mcg 8/22 6. Metabolic bone disease: Ca/Phos ok. Continue Auryxia, Calcitriol. Parsabiv not available inpatient- follow calcium.  7. Nutrition:Renal diet with fluid restrictions recommended  8.  Recent MSSA bacteremia/ AVG infection: Hx infected AVG and  had revision in 5/20, then partial resection/ revision in 6/20, then all AVG material (x2) removed 6/30.S/p 6 weeks of ancef IV completed   Veneta Penton, PA-C 06/15/2019, 9:36 AM  [facility] Pager: [phone]  Pt seen, examined and agree w A/P as above. Will let BP's come up, pt c/o decreased LE strength this am and BP's have dropped overnight w/ home meds kicking in, had similar episode last admit while in ICU rx'd w/ pressors briefly. Have d/w primary, will get neuro input if not improving.  [name]  MD 06/15/2019, 12:12 PM

## 2019-06-15 NOTE — Progress Notes (Signed)
PROGRESS NOTE    Frank Fuller  [id number] DOB: Nov 08, 1960 DOA: 06/13/2019 PCP: [facility]   Brief Narrative:  HPI on 06/13/2019 by Dr. Fuller Plan Frank Fuller is a 58 y.o. male with medical history significant of hypertension, ESRD on HD(T/Th/Sat) AAA s/p stent , HCV s/p treatment, depression, and CVA; who initially presented to [facility] with multiple complaints.  Complains of having aching pain of his abdomen, back, and chest.  Associated symptoms included constipation with last bowel movement 8 days ago, fever, nonproductive cough, and chills.  Patient just recently been hospitalized from 8/11-8/20 for dissection of the thoracic aorta treated with endovascular stentgraft with spinal drain by Dr. Oneida Alar on 8/14.  He had just been discharged home and patient immediately came back to the hospital.  He complained of having the urge to use restroom but being unable to do so.  His last hemodialysis session was on 8/20 prior to his discharge.  Upon arrival to [facility] patient was noted to be febrile up to 102.4 F, blood pressure 192/96, heart rates 107, and O2 saturations maintained on room air.  Labs significant for W BC 14.8-> 17.9, hemoglobin 10.2, sodium 132, potassium 3.6, BUN 32, creatinine 5.8, CRP 172.2, procalcitonin 3.04, and pro BNP 54500.  COVID-19 screening negative.  CT angiogram of the chest abdomen and pelvis was obtained.  It showed interval repair of the thoracic aortic dissection, slight increase in left pleural effusion, improved right lung nodular airspace disease likely infectious, generalized ileus with slow bowel transit, and large colonic stool burden with mild rectal wall thickening.  Patient received vancomycin, cefepime, morphine, fentanyl, and magnesium citrate.  Transfer was requested here at [facility].  Interim history Admitted with sepsis. Poor historian. Per nephrology, patient having problems with his lower extremities. Will order CT head  and if needed neurology exam.   Assessment & Plan   Sepsis possibly secondary to pneumonia/proctititis, questionable cellulitis -Present on admission, patient presented with fever of 102.4 F, leukocytosis at outside facility -CRP and pro calcitonin were both elevated -CT noted right-sided pneumonia with ileus and proctitis -Nephrology noted left arm cellulitis -Cultures from outside facility pending -Patient placed on broad-spectrum antibiotics of vancomycin and cefepime -Continues to have fevers -increasing leukocytosis today  Hypoxia -Patient was noted to have hypoxia, oxygen saturations of 87% on room air -Continue supplemental oxygen to maintain saturations above 90% -CT did show left pleural effusion, proBNP was elevated -Continue volume control with dialysis  Acute Illeus and proctitis -Noted on CT imaging at outside facility showing signs of ileus with large stool burden and inflammation of the rectum  -Patient reported feeling nauseous but denied any current vomiting -Would like to eat - will place on renal diet  Anemia of chronic disease -Hemoglobin currently 8.9 -FOBT negative -Continue iron supplementation -Continue to monitor  Left upper extremity swelling -Patient was evaluated with Doppler ultrasound of the left upper extremity during his last hospitalization and noted to be negative for any signs of a DVT. -Continue to monitor  Thoracic aortic dissection -Recent admission and discharge by vascular surgery with endovascular stent placement -Patient will need to follow-up with vascular surgery as an outpatient   ESRD -On hemodialysis on Tuesday, Thursday, Saturday -Nephrology consulted and appreciated  Essential hypertension -Stable, continue labetalol, hydralazine, amlodipine, losartan, and minoxidil- wit holding parameters  Lower extremity weakness -will order CT head and monitor  -if needed will consult neurology  DVT Prophylaxis  SCDs  Code  Status: Full  Family Communication:  None at bedside  Disposition Plan: Admitted. Suspect home in 2-3 days.   Consultants Nephrology  Procedures  None  Antibiotics   Anti-infectives (From admission, onward)   Start     Dose/Rate Route Frequency Ordered Stop   06/14/19 1800  ceFEPIme (MAXIPIME) 2 g in sodium chloride 0.9 % 100 mL IVPB     2 g 200 mL/hr over 30 Minutes Intravenous Every T-Th-Sa (1800) 06/13/19 1540     06/14/19 1200  vancomycin (VANCOCIN) IVPB 1000 mg/200 mL premix     1,000 mg 200 mL/hr over 60 Minutes Intravenous Every T-Th-Sa (Hemodialysis) 06/13/19 1540     06/13/19 1545  vancomycin (VANCOCIN) 2,000 mg in sodium chloride 0.9 % 500 mL IVPB     2,000 mg 250 mL/hr over 120 Minutes Intravenous  Once 06/13/19 1519 06/13/19 1829   06/13/19 1545  ceFEPIme (MAXIPIME) 2 g in sodium chloride 0.9 % 100 mL IVPB     2 g 200 mL/hr over 30 Minutes Intravenous  Once 06/13/19 1519 06/13/19 2241      Subjective:   Frank Fuller seen and examined today.  Is a poor historian.  Unable to tell me what is wrong with him.  Everything that I asked he responds yes to.  He responds yes to feeling poorly, having diarrhea and abdominal pain.    Discussed with nephrology, patient crying on their examination due to not being able to move his lower extremities.  Objective:   Vitals:   06/14/19 1623 06/14/19 2014 06/15/19 0423 06/15/19 0900  BP: (!) 105/57 97/64 121/63 118/64  Pulse: 91 99 78 80  Resp: 18 18 18 20   Temp: 100 F (37.8 C) 100.3 F (37.9 C) 98.9 F (37.2 C) 98.6 F (37 C)  TempSrc: Oral Oral Oral Oral  SpO2: 97% 99% 100% 100%  Weight:  91.6 kg      Intake/Output Summary (Last 24 hours) at 06/15/2019 1044 Last data filed at 06/15/2019 0900 Gross per 24 hour  Intake 180 ml  Output 0 ml  Net 180 ml   Filed Weights   06/14/19 0715 06/14/19 1040 06/14/19 2014  Weight: 94.8 kg 91.6 kg 91.6 kg   Exam  General: Well developed, chronically ill-appearing, NAD   HEENT: NCAT, mucous membranes moist.   Cardiovascular: S1 S2 auscultated, RRR, no murmur  Respiratory: Clear to auscultation bilaterally with equal chest rise  Abdomen: Soft, nontender, nondistended, + bowel sounds  Extremities: warm dry without cyanosis clubbing or edema. LUE edema  Neuro: AAOx3, nonfocal (moving extremities)  Psych: Normal affect and demeanor  Data Reviewed: I have personally reviewed following labs and imaging studies  CBC: Recent Labs  Lab 06/11/19 0352 06/12/19 0312 06/13/19 1601 06/14/19 0449 06/15/19 0619  WBC 8.7 8.9 17.6* 21.2* 26.1*  HGB 9.2* 8.7* 9.2* 8.0* 8.9*  HCT 28.5* 26.2* 27.8* 24.8* 27.2*  MCV 96.0 94.2 94.2 96.5 95.4  PLT 357 339 324 321 [id number]   Basic Metabolic Panel: Recent Labs  Lab 06/10/19 0319 06/11/19 0352 06/12/19 0312 06/14/19 0449 06/15/19 0619  NA 135 135 132* 130* 132*  K 4.3 4.1 4.1 4.5 4.5  CL 96* 95* 94* 91* 92*  CO2 25 25 24 22 24   GLUCOSE 104* 102* 105* 85 108*  BUN 36* 36* 55* 50* 38*  CREATININE 7.90* 6.89* 9.29* 9.69* 7.81*  CALCIUM 8.9 9.2 9.1 8.6* 9.1  PHOS  --   --   --   --  5.2*   GFR: Estimated Creatinine Clearance: 12.3 mL/min (  A) (by C-G formula based on SCr of 7.81 mg/dL (H)). Liver Function Tests: Recent Labs  Lab 06/15/19 0619  ALBUMIN 2.0*   No results for input(s): LIPASE, AMYLASE in the last 168 hours. No results for input(s): AMMONIA in the last 168 hours. Coagulation Profile: No results for input(s): INR, PROTIME in the last 168 hours. Cardiac Enzymes: No results for input(s): CKTOTAL, CKMB, CKMBINDEX, TROPONINI in the last 168 hours. BNP (last 3 results) No results for input(s): PROBNP in the last 8760 hours. HbA1C: No results for input(s): HGBA1C in the last 72 hours. CBG: Recent Labs  Lab 06/14/19 1620 06/14/19 2013 06/15/19 0007 06/15/19 0422 06/15/19 0714  GLUCAP 133* 122* 113* 101* 97   Lipid Profile: No results for input(s): CHOL, HDL, LDLCALC, TRIG, CHOLHDL,  LDLDIRECT in the last 72 hours. Thyroid Function Tests: No results for input(s): TSH, T4TOTAL, FREET4, T3FREE, THYROIDAB in the last 72 hours. Anemia Panel: No results for input(s): VITAMINB12, FOLATE, FERRITIN, TIBC, IRON, RETICCTPCT in the last 72 hours. Urine analysis:    Component Value Date/Time   COLORURINE YELLOW 08/04/2017 1824   APPEARANCEUR HAZY (A) 08/04/2017 1824   LABSPEC 1.009 08/04/2017 1824   PHURINE 9.0 (H) 08/04/2017 1824   GLUCOSEU 150 (A) 08/04/2017 1824   HGBUR NEGATIVE 08/04/2017 1824   BILIRUBINUR NEGATIVE 08/04/2017 1824   KETONESUR NEGATIVE 08/04/2017 1824   PROTEINUR >=300 (A) 08/04/2017 1824   NITRITE NEGATIVE 08/04/2017 1824   LEUKOCYTESUR NEGATIVE 08/04/2017 1824   Sepsis Labs: @LABRCNTIP (procalcitonin:4,lacticidven:4)  ) Recent Results (from the past 240 hour(s))  Surgical pcr screen     Status: None   Collection Time: 06/06/19  4:32 AM   Specimen: Nasal Mucosa; Nasal Swab  Result Value Ref Range Status   MRSA, PCR NEGATIVE NEGATIVE Final   Staphylococcus aureus NEGATIVE NEGATIVE Final    Comment: (NOTE) The Xpert SA Assay (FDA approved for NASAL specimens in patients 29 years of age and older), is one component of a comprehensive surveillance program. It is not intended to diagnose infection nor to guide or monitor treatment. Performed at [facility], [place] [address], [place], [place] [address]       Radiology Studies: No results found.   Scheduled Meds: . amLODipine  5 mg Oral QHS  . Chlorhexidine Gluconate Cloth  6 each Topical Q0600  . darbepoetin (ARANESP) injection - DIALYSIS  100 mcg Intravenous Q Sat-HD  . ferric citrate  420 mg Oral With snacks  . ferric citrate  630 mg Oral TID WC  . hydrALAZINE  100 mg Oral TID  . labetalol  300 mg Oral BID  . losartan  50 mg Oral BID  . minoxidil  2.5 mg Oral BID  . sodium chloride flush  3 mL Intravenous Q12H   Continuous Infusions: . ceFEPime (MAXIPIME) IV 2 g (06/14/19 1810)   . vancomycin 1,000 mg (06/14/19 0920)     LOS: 2 days   Time Spent in minutes   45 minutes  Miko Markwood D.O. on 06/15/2019 at 10:44 AM  Between 7am to 7pm - Please see pager noted on [email or web address]  After 7pm go to [email or web address]  And look for the night coverage person covering for me after hours  [facility] Office  [phone]

## 2019-06-15 NOTE — Progress Notes (Signed)
Pt could not tolerate final exam. Pt refused further scanning. Per RN due to BP, pt can only have PO pain meds. If pt wishes to attempt Lumbar spine scan, will attempt at later date.

## 2019-06-16 DIAGNOSIS — Z8619 Personal history of other infectious and parasitic diseases: Secondary | ICD-10-CM

## 2019-06-16 DIAGNOSIS — H409 Unspecified glaucoma: Secondary | ICD-10-CM

## 2019-06-16 DIAGNOSIS — Z888 Allergy status to other drugs, medicaments and biological substances status: Secondary | ICD-10-CM

## 2019-06-16 DIAGNOSIS — Z95828 Presence of other vascular implants and grafts: Secondary | ICD-10-CM

## 2019-06-16 DIAGNOSIS — Z8679 Personal history of other diseases of the circulatory system: Secondary | ICD-10-CM

## 2019-06-16 DIAGNOSIS — M4646 Discitis, unspecified, lumbar region: Secondary | ICD-10-CM | POA: Diagnosis present

## 2019-06-16 DIAGNOSIS — Z885 Allergy status to narcotic agent status: Secondary | ICD-10-CM

## 2019-06-16 DIAGNOSIS — F17200 Nicotine dependence, unspecified, uncomplicated: Secondary | ICD-10-CM

## 2019-06-16 DIAGNOSIS — N186 End stage renal disease: Secondary | ICD-10-CM

## 2019-06-16 DIAGNOSIS — I10 Essential (primary) hypertension: Secondary | ICD-10-CM

## 2019-06-16 DIAGNOSIS — I120 Hypertensive chronic kidney disease with stage 5 chronic kidney disease or end stage renal disease: Secondary | ICD-10-CM

## 2019-06-16 DIAGNOSIS — F17210 Nicotine dependence, cigarettes, uncomplicated: Secondary | ICD-10-CM

## 2019-06-16 DIAGNOSIS — Z8673 Personal history of transient ischemic attack (TIA), and cerebral infarction without residual deficits: Secondary | ICD-10-CM

## 2019-06-16 DIAGNOSIS — G9511 Acute infarction of spinal cord (embolic) (nonembolic): Secondary | ICD-10-CM

## 2019-06-16 DIAGNOSIS — Z992 Dependence on renal dialysis: Secondary | ICD-10-CM

## 2019-06-16 DIAGNOSIS — D72829 Elevated white blood cell count, unspecified: Secondary | ICD-10-CM

## 2019-06-16 LAB — LIPID PANEL
Cholesterol: 128 mg/dL (ref 0–200)
HDL: <10 mg/dL — ABNORMAL LOW (ref >40)
Triglycerides: 378 mg/dL — ABNORMAL HIGH (ref <150)
VLDL: 76 mg/dL — ABNORMAL HIGH (ref 0–40)

## 2019-06-16 LAB — SEDIMENTATION RATE: Sed Rate: 138 mm/hr — ABNORMAL HIGH (ref 0–16)

## 2019-06-16 LAB — CBC
HCT: 24.8 % — ABNORMAL LOW (ref 39.0–52.0)
Hemoglobin: 8.3 g/dL — ABNORMAL LOW (ref 13.0–17.0)
MCH: 31.2 pg (ref 26.0–34.0)
MCHC: 33.5 g/dL (ref 30.0–36.0)
MCV: 93.2 fL (ref 80.0–100.0)
Platelets: 375 10*3/uL (ref 150–400)
RBC: 2.66 MIL/uL — ABNORMAL LOW (ref 4.22–5.81)
RDW: 16.9 % — ABNORMAL HIGH (ref 11.5–15.5)
WBC: 27.2 10*3/uL — ABNORMAL HIGH (ref 4.0–10.5)
nRBC: 0 % (ref 0.0–0.2)

## 2019-06-16 LAB — RENAL FUNCTION PANEL
Albumin: 1.9 g/dL — ABNORMAL LOW (ref 3.5–5.0)
Anion gap: 15 (ref 5–15)
BUN: 59 mg/dL — ABNORMAL HIGH (ref 6–20)
CO2: 24 mmol/L (ref 22–32)
Calcium: 8.7 mg/dL — ABNORMAL LOW (ref 8.9–10.3)
Chloride: 91 mmol/L — ABNORMAL LOW (ref 98–111)
Creatinine, Ser: 10.5 mg/dL — ABNORMAL HIGH (ref 0.61–1.24)
GFR calc Af Amer: 5 mL/min — ABNORMAL LOW (ref >60)
GFR calc non Af Amer: 5 mL/min — ABNORMAL LOW (ref >60)
Glucose, Bld: 116 mg/dL — ABNORMAL HIGH (ref 70–99)
Phosphorus: 5 mg/dL — ABNORMAL HIGH (ref 2.5–4.6)
Potassium: 4.2 mmol/L (ref 3.5–5.1)
Sodium: 130 mmol/L — ABNORMAL LOW (ref 135–145)

## 2019-06-16 LAB — HEMOGLOBIN A1C
Hgb A1c MFr Bld: 5 % (ref 4.8–5.6)
Mean Plasma Glucose: 96.8 mg/dL

## 2019-06-16 LAB — GLUCOSE, CAPILLARY
Glucose-Capillary: 116 mg/dL — ABNORMAL HIGH (ref 70–99)
Glucose-Capillary: 117 mg/dL — ABNORMAL HIGH (ref 70–99)
Glucose-Capillary: 129 mg/dL — ABNORMAL HIGH (ref 70–99)

## 2019-06-16 MED ORDER — OXYCODONE HCL 5 MG PO TABS
5.0000 mg | ORAL_TABLET | ORAL | Status: DC | PRN
  Administered 2019-06-16 – 2019-06-30 (×32): 5 mg via ORAL
  Filled 2019-06-16 (×31): qty 1

## 2019-06-16 MED ORDER — OXYCODONE HCL 5 MG PO TABS: 5.0000 mg | ORAL_TABLET | ORAL | Status: DC | PRN

## 2019-06-16 MED ORDER — ATORVASTATIN CALCIUM 10 MG PO TABS
20.0000 mg | ORAL_TABLET | Freq: Every day | ORAL | Status: DC
  Administered 2019-06-16 – 2019-07-02 (×17): 20 mg via ORAL
  Filled 2019-06-16 (×19): qty 2

## 2019-06-16 MED ORDER — TRAMADOL HCL 50 MG PO TABS
50.0000 mg | ORAL_TABLET | Freq: Two times a day (BID) | ORAL | Status: DC | PRN
  Filled 2019-06-16: qty 1

## 2019-06-16 MED ORDER — FERRIC CITRATE 1 GM 210 MG(FE) PO TABS: 420.0000 mg | ORAL_TABLET | Freq: Two times a day (BID) | ORAL | Status: DC | PRN

## 2019-06-16 NOTE — Progress Notes (Addendum)
PROGRESS NOTE    Frank Fuller  [id number] DOB: 01-29-1961 DOA: 06/13/2019 PCP: [facility]   Brief Narrative:  HPI on 06/13/2019 by Dr. Fuller Plan Frank Fuller is a 58 y.o. male with medical history significant of hypertension, ESRD on HD(T/Th/Sat) AAA s/p stent , HCV s/p treatment, depression, and CVA; who initially presented to [facility] with multiple complaints.  Complains of having aching pain of his abdomen, back, and chest.  Associated symptoms included constipation with last bowel movement 8 days ago, fever, nonproductive cough, and chills.  Patient just recently been hospitalized from 8/11-8/20 for dissection of the thoracic aorta treated with endovascular stentgraft with spinal drain by Dr. Oneida Alar on 8/14.  He had just been discharged home and patient immediately came back to the hospital.  He complained of having the urge to use restroom but being unable to do so.  His last hemodialysis session was on 8/20 prior to his discharge.  Upon arrival to [facility] patient was noted to be febrile up to 102.4 F, blood pressure 192/96, heart rates 107, and O2 saturations maintained on room air.  Labs significant for W BC 14.8-> 17.9, hemoglobin 10.2, sodium 132, potassium 3.6, BUN 32, creatinine 5.8, CRP 172.2, procalcitonin 3.04, and pro BNP 54500.  COVID-19 screening negative.  CT angiogram of the chest abdomen and pelvis was obtained.  It showed interval repair of the thoracic aortic dissection, slight increase in left pleural effusion, improved right lung nodular airspace disease likely infectious, generalized ileus with slow bowel transit, and large colonic stool burden with mild rectal wall thickening.  Patient received vancomycin, cefepime, morphine, fentanyl, and magnesium citrate.  Transfer was requested here at [facility].  Interim history Admitted with sepsis. Poor historian. Per nephrology, patient having problems with his lower extremities. Will order CT head  and if needed neurology exam.   Assessment & Plan   Sepsis possibly secondary to pneumonia/proctititis, questionable cellulitis -Present on admission, patient presented with fever of 102.4 F, leukocytosis at outside facility -CRP and pro calcitonin were both elevated -CT noted right-sided pneumonia with ileus and proctitis -Nephrology noted left arm cellulitis -Cultures from outside facility [facility]) show no growth -Blood cultures from 8/23 obtained here currently pending  -Patient placed on broad-spectrum antibiotics of vancomycin and cefepime -Continues to have fevers- low grade 99.35F -increasing leukocytosis today  Hypoxia -Patient was noted to have hypoxia, oxygen saturations of 87% on room air -Continue supplemental oxygen to maintain saturations above 90% -CT did show left pleural effusion, proBNP was elevated -Continue volume control with dialysis -COVID at [facility] negative  Acute Illeus and proctitis -Noted on CT imaging at outside facility showing signs of ileus with large stool burden and inflammation of the rectum  -Patient reported feeling nauseous but denied any current vomiting -Would like to eat - will place on renal diet  Anemia of chronic disease -Hemoglobin currently 8.9 -FOBT negative -Continue iron supplementation -Continue to monitor  Left upper extremity swelling -Patient was evaluated with Doppler ultrasound of the left upper extremity during his last hospitalization and noted to be negative for any signs of a DVT. -Continue to monitor  Thoracic aortic dissection -Recent admission and discharge by vascular surgery with endovascular stent placement -Patient will need to follow-up with vascular surgery as an outpatient   ESRD -On hemodialysis on Tuesday, Thursday, Saturday -Nephrology consulted and appreciated  Essential hypertension -Stable, continue labetalol, hydralazine, amlodipine, losartan, and minoxidil- wit holding  parameters  Lower extremity weakness- Spinal infarct -CT head: Left  caudate head lacunar infarct new since prior study but appears remote.  No acute intercranial normality. 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  Abnormal spinal cord at T9-T10, T10-11, possible segmental cord infarct. -? Could this be related to recent aortic dissection and graft -Neurosurgery consulted and appreciated, no intervention -Will consult PT and OT  ?Acute Discitis Osteomyelitis -MRI lumbar spine shows L4-5 possible acute discitis -Discussed with neurosurgery, possibly degenerative disc. Would obtain blood cultures. Possible sed rate, ID consult. -As above blood cultures currently pending -Patient currently on broad-spectrum antibiotics of vancomycin and cefepime  DVT Prophylaxis  SCDs  Code Status: Full  Family Communication: None at bedside  Disposition Plan: Admitted. Dispo TBD  Consultants Nephrology Neurology Neurosurgery  Procedures  None  Antibiotics   Anti-infectives (From admission, onward)   Start     Dose/Rate Route Frequency Ordered Stop   06/14/19 1800  ceFEPIme (MAXIPIME) 2 g in sodium chloride 0.9 % 100 mL IVPB     2 g 200 mL/hr over 30 Minutes Intravenous Every T-Th-Sa (1800) 06/13/19 1540     06/14/19 1200  vancomycin (VANCOCIN) IVPB 1000 mg/200 mL premix     1,000 mg 200 mL/hr over 60 Minutes Intravenous Every T-Th-Sa (Hemodialysis) 06/13/19 1540     06/13/19 1545  vancomycin (VANCOCIN) 2,000 mg in sodium chloride 0.9 % 500 mL IVPB     2,000 mg 250 mL/hr over 120 Minutes Intravenous  Once 06/13/19 1519 06/13/19 1829   06/13/19 1545  ceFEPIme (MAXIPIME) 2 g in sodium chloride 0.9 % 100 mL IVPB     2 g 200 mL/hr over 30 Minutes Intravenous  Once 06/13/19 1519 06/13/19 2241      Subjective:    Frank Fuller seen and examined today.  Patient is a poor historian.  Today tells me that he is able to move his legs more and feel them.  Denies current chest pain, shortness breath, abdominal pain, nausea vomiting, diarrhea constipation.  Generally he just feels pain all over. Objective:   Vitals:   06/15/19 1712 06/15/19 1924 06/15/19 2117 06/16/19 0350  BP: 134/71  (!) 164/105 109/60  Pulse: 91 93 89 81  Resp:  19  19  Temp: 99.2 F (37.3 C) 98.9 F (37.2 C) 99.6 F (37.6 C) 99.1 F (37.3 C)  TempSrc: Oral Oral Oral Oral  SpO2: 98% 98% 97% 97%  Weight:        Intake/Output Summary (Last 24 hours) at 06/16/2019 1003 Last data filed at 06/16/2019 0600 Gross per 24 hour  Intake 480 ml  Output 0 ml  Net 480 ml   Filed Weights   06/14/19 0715 06/14/19 1040 06/14/19 2014  Weight: 94.8 kg 91.6 kg 91.6 kg   Exam  General: Well developed, chronically ill-appearing, NAD  HEENT: NCAT, mucous membranes moist.   Cardiovascular: S1 S2 auscultated, RRR, no murmur  Respiratory: Clear to auscultation bilaterally  Abdomen: Soft, nontender, nondistended, + bowel sounds  Extremities: warm dry without cyanosis clubbing or edema of LE. LUE edema  Neuro: AAOx3, nonfocal, moving all extremities   Psych: Appropriate mood and affect  Data Reviewed: I have personally reviewed following labs and imaging studies  CBC: Recent Labs  Lab 06/12/19 0312 06/13/19 1601 06/14/19 0449 06/15/19 0619 06/16/19 0617  WBC 8.9 17.6* 21.2* 26.1* 27.2*  HGB 8.7* 9.2* 8.0* 8.9* 8.3*  HCT 26.2* 27.8* 24.8* 27.2* 24.8*  MCV 94.2 94.2 96.5 95.4 93.2  PLT 339 324 321 364 [id number]   Basic Metabolic Panel: Recent Labs  Lab 06/11/19 0352 06/12/19 0312 06/14/19 0449 06/15/19 0619 06/16/19 0617  NA 135 132* 130* 132* 130*  K 4.1 4.1 4.5 4.5 4.2  CL 95* 94* 91* 92* 91*  CO2 25 24 22 24 24   GLUCOSE 102* 105* 85 108* 116*  BUN 36* 55* 50* 38* 59*  CREATININE 6.89* 9.29* 9.69* 7.81* 10.50*   CALCIUM 9.2 9.1 8.6* 9.1 8.7*  PHOS  --   --   --  5.2* 5.0*   GFR: Estimated Creatinine Clearance: 9.2 mL/min (A) (by C-G formula based on SCr of 10.5 mg/dL (H)). Liver Function Tests: Recent Labs  Lab 06/15/19 0619 06/16/19 0617  ALBUMIN 2.0* 1.9*   No results for input(s): LIPASE, AMYLASE in the last 168 hours. No results for input(s): AMMONIA in the last 168 hours. Coagulation Profile: No results for input(s): INR, PROTIME in the last 168 hours. Cardiac Enzymes: No results for input(s): CKTOTAL, CKMB, CKMBINDEX, TROPONINI in the last 168 hours. BNP (last 3 results) No results for input(s): PROBNP in the last 8760 hours. HbA1C: Recent Labs    06/16/19 0617  HGBA1C 5.0   CBG: Recent Labs  Lab 06/15/19 1708 06/15/19 2119 06/15/19 2358 06/16/19 0353 06/16/19 0733  GLUCAP 109* 127* 117* 129* 116*   Lipid Profile: Recent Labs    06/16/19 0617  CHOL 128  HDL <10*  LDLCALC NOT CALCULATED  TRIG 378*  CHOLHDL NOT CALCULATED   Thyroid Function Tests: No results for input(s): TSH, T4TOTAL, FREET4, T3FREE, THYROIDAB in the last 72 hours. Anemia Panel: No results for input(s): VITAMINB12, FOLATE, FERRITIN, TIBC, IRON, RETICCTPCT in the last 72 hours. Urine analysis:    Component Value Date/Time   COLORURINE YELLOW 08/04/2017 1824   APPEARANCEUR HAZY (A) 08/04/2017 1824   LABSPEC 1.009 08/04/2017 1824   PHURINE 9.0 (H) 08/04/2017 1824   GLUCOSEU 150 (A) 08/04/2017 1824   HGBUR NEGATIVE 08/04/2017 1824   BILIRUBINUR NEGATIVE 08/04/2017 1824   KETONESUR NEGATIVE 08/04/2017 1824   PROTEINUR >=300 (A) 08/04/2017 1824   NITRITE NEGATIVE 08/04/2017 1824   LEUKOCYTESUR NEGATIVE 08/04/2017 1824   Sepsis Labs: @LABRCNTIP (procalcitonin:4,lacticidven:4)  ) No results found for this or any previous visit (from the past 240 hour(s)).    Radiology Studies: Ct Head Wo Contrast  Result Date: 06/15/2019 CLINICAL DATA:  Fatigue and malaise. Hypertension. End-stage  renal disease. EXAM: CT HEAD WITHOUT CONTRAST TECHNIQUE: Contiguous axial images were obtained from the base of the skull through the vertex without intravenous contrast. COMPARISON:  MRI of the head 12/13/2016 FINDINGS: Brain: A remote lacunar infarct of the left caudate head is new, but not acute. Extensive periventricular white matter changes are otherwise stable. Confluent subcortical white matter hypoattenuation is also stable, right greater than left. No acute cortical infarct, hemorrhage, or mass lesion is present. The ventricles are proportionate to the degree of atrophy. No significant extraaxial fluid collection is present. The brainstem and cerebellum are within normal limits. Vascular: Atherosclerotic changes are noted within the cavernous internal carotid arteries bilaterally. There is no hyperdense vessel. Skull: Calvarium is intact. No focal lytic or blastic lesions are present. No significant extracranial soft tissue lesion is present. Sinuses/Orbits: The paranasal sinuses and mastoid air cells are clear. The globes and orbits are within normal limits. IMPRESSION: 1. Left caudate head lacunar infarct is new since the prior study but appears remote. 2. Otherwise stable extensive periventricular and  subcortical white matter changes bilaterally consistent chronic microvascular ischemia. 3. No acute intracranial abnormality. Electronically Signed   By: San Morelle M.D.   On: 06/15/2019 11:47   Mr Cervical Spine Wo Contrast  Result Date: 06/15/2019 CLINICAL DATA:  58 year old male with acute myelopathy. Known aortic dissection, recently treated with stenting. EXAM: MRI CERVICAL AND THORACIC SPINE WITHOUT CONTRAST TECHNIQUE: Multiplanar and multiecho pulse sequences of the cervical spine, to include the craniocervical junction and cervicothoracic junction, and the thoracic spine, were obtained without intravenous contrast. COMPARISON:  CTA CT Chest, Abdomen, and Pelvis 06/13/2019 and earlier.  No prior cervical spine imaging, but brain MRI 12/13/2016. FINDINGS: MRI CERVICAL SPINE FINDINGS Alignment: Straightening of cervical lordosis. No spondylolisthesis. Vertebrae: Degenerative appearing endplate marrow signal changes at C5-C6 with faint marrow edema. No other marrow edema or evidence of acute osseous abnormality. Background bone marrow signal is within normal limits. Cord: Mass effect on the cervical spinal cord at C4-C5 and C5-C6. Is difficult to exclude abnormal cord signal at both levels such as due to myelomalacia due to compressive myelopathy (series 8, image 24 at C5-C6). And there is evidence of abnormal signal on series 8, image 24 greater on the left. Above and below those levels spinal cord signal and morphology is within normal limits. Posterior Fossa, vertebral arteries, paraspinal tissues: Cervicomedullary junction is within normal limits. Negative visible posterior fossa. Preserved major vascular flow voids in the neck. The left vertebral artery appears dominant. Negative neck soft tissues. Disc levels: Degenerative changes notable for: C3-C4: Right eccentric circumferential disc osteophyte complex with mild to moderate right C4 foraminal stenosis. C4-C5: Circumferential disc osteophyte complex with broad-based posterior component. Spinal stenosis with mild to moderate spinal cord mass effect (series 8, image 20). Severe left and moderate to severe right C5 foraminal stenosis. C5-C6: Disc space loss with circumferential disc osteophyte complex. Broad-based posterior component. Mild spinal stenosis and mild to moderate cord mass effect. Severe bilateral C6 foraminal stenosis. C6-C7: Circumferential disc osteophyte complex mostly affecting the neural foramina. Borderline to mild spinal stenosis without cord mass effect. Severe bilateral C7 foraminal stenosis. C7-T1: Mild to moderate facet hypertrophy greater on the left. Mild to moderate left C8 foraminal stenosis. MRI THORACIC SPINE  FINDINGS Thoracic spine segmentation:  Normal on the comparison CT. Alignment:  Preserved thoracic kyphosis.  No spondylolisthesis. Vertebrae: Mild endplate irregularity including superiorly at T8 appears to be stable and chronic. STIR hyperintensity at the inferior T6 endplate appears to be related to a Schmorl's node (series 14, image 10). No acute osseous abnormality identified. Cord: Above T10 the thoracic spinal cord signal and morphology is within normal limits. Beginning at T9-T10 there is suggestion of abnormal increased central cord signal (series 16, image 31), although no definite cord expansion. By the mid T11 level the cord signal normalizes, and the visible conus appears normal, terminating at T12-L1. Fairly capacious thoracic spinal canal. Paraspinal and other soft tissues: Thoracic aortic dissection with evidence of stent redemonstrated on series 16, image 16. Left pleural effusion appears regressed. Negative visible upper abdominal viscera. There is patchy nonspecific T2 and STIR hyperintensity in the midthoracic erector spinal muscles in the midline posteriorly (series 14, image 10 and series 16, image 20). Little if any muscle expansion. Other paraspinal soft tissues are within normal limits. Disc levels: Endplate degeneration but no thoracic spinal stenosis. No discrete thoracic disc herniation. IMPRESSION: 1. 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 2.  More questionably abnormal spinal cord at the T9-T10 and T10-T11 level where central cord signal abnormality without cord expansion might indicate a segmental cord infarct in this clinical setting. But the remaining thoracic spinal cord and conus appear normal. 3. Nonspecific mild bilateral posterior paraspinal muscle inflammation at the midthoracic level (T6 through T9). 4. Other cervical spine degeneration including up to severe  multilevel cervical neural foraminal stenosis, and also mild spinal stenosis at C6-C7. 5. Thoracic aortic dissection and endograft re-demonstrated. Electronically Signed   By: Genevie Ann M.D.   On: 06/15/2019 17:20   Mr Thoracic Spine Wo Contrast  Result Date: 06/15/2019 CLINICAL DATA:  58 year old male with acute myelopathy. Known aortic dissection, recently treated with stenting. EXAM: MRI CERVICAL AND THORACIC SPINE WITHOUT CONTRAST TECHNIQUE: Multiplanar and multiecho pulse sequences of the cervical spine, to include the craniocervical junction and cervicothoracic junction, and the thoracic spine, were obtained without intravenous contrast. COMPARISON:  CTA CT Chest, Abdomen, and Pelvis 06/13/2019 and earlier. No prior cervical spine imaging, but brain MRI 12/13/2016. FINDINGS: MRI CERVICAL SPINE FINDINGS Alignment: Straightening of cervical lordosis. No spondylolisthesis. Vertebrae: Degenerative appearing endplate marrow signal changes at C5-C6 with faint marrow edema. No other marrow edema or evidence of acute osseous abnormality. Background bone marrow signal is within normal limits. Cord: Mass effect on the cervical spinal cord at C4-C5 and C5-C6. Is difficult to exclude abnormal cord signal at both levels such as due to myelomalacia due to compressive myelopathy (series 8, image 24 at C5-C6). And there is evidence of abnormal signal on series 8, image 24 greater on the left. Above and below those levels spinal cord signal and morphology is within normal limits. Posterior Fossa, vertebral arteries, paraspinal tissues: Cervicomedullary junction is within normal limits. Negative visible posterior fossa. Preserved major vascular flow voids in the neck. The left vertebral artery appears dominant. Negative neck soft tissues. Disc levels: Degenerative changes notable for: C3-C4: Right eccentric circumferential disc osteophyte complex with mild to moderate right C4 foraminal stenosis. C4-C5: Circumferential disc  osteophyte complex with broad-based posterior component. Spinal stenosis with mild to moderate spinal cord mass effect (series 8, image 20). Severe left and moderate to severe right C5 foraminal stenosis. C5-C6: Disc space loss with circumferential disc osteophyte complex. Broad-based posterior component. Mild spinal stenosis and mild to moderate cord mass effect. Severe bilateral C6 foraminal stenosis. C6-C7: Circumferential disc osteophyte complex mostly affecting the neural foramina. Borderline to mild spinal stenosis without cord mass effect. Severe bilateral C7 foraminal stenosis. C7-T1: Mild to moderate facet hypertrophy greater on the left. Mild to moderate left C8 foraminal stenosis. MRI THORACIC SPINE FINDINGS Thoracic spine segmentation:  Normal on the comparison CT. Alignment:  Preserved thoracic kyphosis.  No spondylolisthesis. Vertebrae: Mild endplate irregularity including superiorly at T8 appears to be stable and chronic. STIR hyperintensity at the inferior T6 endplate appears to be related to a Schmorl's node (series 14, image 10). No acute osseous abnormality identified. Cord: Above T10 the thoracic spinal cord signal and morphology is within normal limits. Beginning at T9-T10 there is suggestion of abnormal increased central cord signal (series 16, image 31), although no definite cord expansion. By the mid T11 level the cord signal normalizes, and the visible conus appears normal, terminating at T12-L1. Fairly capacious thoracic spinal canal. Paraspinal and other soft tissues: Thoracic aortic dissection with evidence of stent redemonstrated on series 16, image 16. Left pleural effusion appears regressed. Negative visible upper abdominal viscera. There is patchy nonspecific T2 and STIR hyperintensity in the  midthoracic erector spinal muscles in the midline posteriorly (series 14, image 10 and series 16, image 20). Little if any muscle expansion. Other paraspinal soft tissues are within normal  limits. Disc levels: Endplate degeneration but no thoracic spinal stenosis. No discrete thoracic disc herniation. IMPRESSION: 1. 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 2. More questionably abnormal spinal cord at the T9-T10 and T10-T11 level where central cord signal abnormality without cord expansion might indicate a segmental cord infarct in this clinical setting. But the remaining thoracic spinal cord and conus appear normal. 3. Nonspecific mild bilateral posterior paraspinal muscle inflammation at the midthoracic level (T6 through T9). 4. Other cervical spine degeneration including up to severe multilevel cervical neural foraminal stenosis, and also mild spinal stenosis at C6-C7. 5. Thoracic aortic dissection and endograft re-demonstrated. Electronically Signed   By: Genevie Ann M.D.   On: 06/15/2019 17:20   Mr Lumbar Spine Wo Contrast  Result Date: 06/15/2019 CLINICAL DATA:  58 year old male with thoracic aortic dissection recently treated with stent/endograft. Myelopathy. Cervical and thoracic MRI earlier today. EXAM: MRI LUMBAR SPINE WITHOUT CONTRAST TECHNIQUE: Multiplanar, multisequence MR imaging of the lumbar spine was performed. No intravenous contrast was administered. COMPARISON:  Cervical and thoracic MRI earlier today. [place] stent CT Chest, Abdomen, and Pelvis. 06/13/2019 FINDINGS: Segmentation:  Normal. Alignment:  Stable lumbar lordosis. Vertebrae: Background bone marrow signal is within normal limits. There are degenerative endplate marrow signal changes at both L4-L5 and L5-S1, and there is rightward endplate marrow edema at L4-L5 (series 6, image 6), however there is some preserved fatty degenerative endplate marrow signal at both levels. See additional L4-L5 and L5-S1 details below. No other acute osseous abnormality identified. No sacral edema and negative  visible SI joints. Conus medullaris and cauda equina: Conus extends to the T12-L1 level, the lower thoracic spinal cord was described earlier today. Paraspinal and other soft tissues: There is abnormal prevertebral and retroperitoneal material in the lower abdomen and pelvis best seen on series 6, image 9. Some of this appears to be anterior to the aortoiliac bifurcation, but also is located in the lower lumbar and sacral prevertebral space. This seems to be new since the CT on 06/13/2019, but is of unclear etiology and significance. Furthermore, note that although the L4-L5 endplates demonstrate some marrow edema eccentric to the right, the adjacent right psoas muscle seems to remain normal on series 8, image 24. Both psoas muscles appear to remain normal throughout their course. Otherwise abnormal kidneys and fusiform aneurysmal enlargement of the abdominal aorta are redemonstrated. The posterior paraspinal soft tissues appear negative. Disc levels: L1-L2: Mild L1 foraminal stenosis greater on the left related to disc bulging and posterior element hypertrophy. L2-L3: Multifactorial mild to moderate spinal stenosis related to circumferential disc bulge, posterior element hypertrophy, and epidural lipomatosis. Moderate left greater than right L3 foraminal stenosis. L3-L4: Moderate to severe multifactorial spinal stenosis related to circumferential disc bulge, posterior element hypertrophy and some epidural lipomatosis. See series 8, image 19. Moderate bilateral L3 foraminal stenosis. L4-L5: Increased T2 and STIR signal in the central and right disc. There was some vacuum phenomena here on the recent CT. Bulky underlying circumferential disc osteophyte complex eccentric to the right. Moderate posterior element hypertrophy. Mild spinal and moderate lateral recess stenosis. Mild to moderate left and severe right L4 foraminal stenosis. L5-S1: Similar increased T2 signal in the disc and bulky circumferential disc  osteophyte complex  although no convincing endplate marrow edema at this level. Mild epidural lipomatosis and posterior element hypertrophy. Moderate to severe bilateral L5 foraminal stenosis. IMPRESSION: 1. The dominant finding is new space-occupying material in the lower retroperitoneum and lumbosacral junction prevertebral space since the CT on 06/13/19. And although the L4-L5 disc and right endplates appear abnormal, the adjacent psoas muscle seem unaffected. However, in discussing this case just now with Dr. 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 Furthermore, we discussed that the hematocrit has been stable since 06/13/2019 - arguing against retroperitoneal hematoma. 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 No drainable fluid collection/abscess is identified. 2. Underlying lower lumbar spine degeneration with multifactorial moderate to severe spinal and/or neural foraminal stenosis L3-L4 through L5-S1. 3. Abdominal aortic aneurysm and native renal atrophy partially redemonstrated. Electronically Signed   By: Genevie Ann M.D.   On: 06/15/2019 20:29   Dg Chest Port 1 View  Result Date: 06/15/2019 CLINICAL DATA:  Reason for exam: ESRD. Sepsis Hx HTN, pneumonia, wide-complex tachycardia EXAM: PORTABLE CHEST 1 VIEW COMPARISON:  CT of the chest on 06/13/2019 performed at [facility]. Chest x-ray on 06/04/2019 from [place]: Patient has RIGHT-sided dialysis catheter, tip overlying the level of the superior vena cava. Patient has had aortic stent graft since the previous chest x-ray. There is subsegmental atelectasis at both lung bases. CT exclude early LEFT LOWER lobe infiltrate. IMPRESSION: 1. Bibasilar atelectasis. 2. LEFT LOWER lobe infiltrate cannot be excluded. Electronically Signed   By: Nolon Nations M.D.   On: 06/15/2019 14:43     Scheduled Meds:   amLODipine  5 mg Oral QHS   darbepoetin (ARANESP) injection - DIALYSIS  100 mcg Intravenous Q Sat-HD   ferric citrate  630 mg Oral TID WC   hydrALAZINE  100 mg Oral TID   labetalol  300 mg Oral BID   losartan  50 mg Oral BID   minoxidil  2.5 mg Oral BID   sodium chloride flush  3 mL Intravenous Q12H   Continuous Infusions:  ceFEPime (MAXIPIME) IV 2 g (06/14/19 1810)   vancomycin 1,000 mg (06/14/19 0920)     LOS: 3 days   Time Spent in minutes   45 minutes  Ronasia Isola D.O. on 06/16/2019 at 10:03 AM  Between 7am to 7pm - Please see pager noted on [email or web address]  After 7pm go to [email or web address]  And look for the night coverage person covering for me after hours  [facility] Office  [phone]
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
## 2019-06-16 NOTE — Progress Notes (Signed)
[facility] Progress Note   Subjective:  Seen in room, drinking coffee. No CP/dyspnea. Upset to hear about findings of spinal MRI - infarct at T9-10, 10-11 and possible ostemyelitis L4-5. Is able to move his toes this morning. Says did not sleep last night, looks stressed/worried.  Objective Vitals:   06/15/19 1924 06/15/19 2117 06/16/19 0350 06/16/19 1019  BP:  (!) 164/105 109/60 128/66  Pulse: 93 89 81 85  Resp: 19  19 20   Temp: 98.9 F (37.2 C) 99.6 F (37.6 C) 99.1 F (37.3 C) 98.1 F (36.7 C)  TempSrc: Oral Oral Oral Oral  SpO2: 98% 97% 97% 98%  Weight:       Physical Exam General:Ill appearing man, tearful again this morning. Heart:RRR; no murmur Lungs:CTA anteriorly Abdomen:soft, mild generalized tenderness without guarding Extremities:No LE edema; 1+ LUE edema with warmth. Wiggling toes on command. Dialysis Access:TDC  Additional Objective Labs: Basic Metabolic Panel: Recent Labs  Lab 06/14/19 0449 06/15/19 0619 06/16/19 0617  NA 130* 132* 130*  K 4.5 4.5 4.2  CL 91* 92* 91*  CO2 22 24 24   GLUCOSE 85 108* 116*  BUN 50* 38* 59*  CREATININE 9.69* 7.81* 10.50*  CALCIUM 8.6* 9.1 8.7*  PHOS  --  5.2* 5.0*   Liver Function Tests: Recent Labs  Lab 06/15/19 0619 06/16/19 0617  ALBUMIN 2.0* 1.9*   CBC: Recent Labs  Lab 06/12/19 0312 06/13/19 1601 06/14/19 0449 06/15/19 0619 06/16/19 0617  WBC 8.9 17.6* 21.2* 26.1* 27.2*  HGB 8.7* 9.2* 8.0* 8.9* 8.3*  HCT 26.2* 27.8* 24.8* 27.2* 24.8*  MCV 94.2 94.2 96.5 95.4 93.2  PLT 339 324 321 364 375   Blood Culture    Component Value Date/Time   SDES BLOOD LEFT ANTECUBITAL 06/15/2019 1128   SPECREQUEST  06/15/2019 1128    BOTTLES DRAWN AEROBIC ONLY Blood Culture results may not be optimal due to an inadequate volume of blood received in culture bottles   CULT  06/15/2019 1128    NO GROWTH < 24 HOURS Performed at [facility], [place] [address], [place],  [address]     REPTSTATUS PENDING 06/15/2019 1128   Studies/Results: Ct Head Wo Contrast  Result Date: 06/15/2019 CLINICAL DATA:  Fatigue and malaise. Hypertension. End-stage renal disease. EXAM: CT HEAD WITHOUT CONTRAST TECHNIQUE: Contiguous axial images were obtained from the base of the skull through the vertex without intravenous contrast. COMPARISON:  MRI of the head 12/13/2016 FINDINGS: Brain: A remote lacunar infarct of the left caudate head is new, but not acute. Extensive periventricular white matter changes are otherwise stable. Confluent subcortical white matter hypoattenuation is also stable, right greater than left. No acute cortical infarct, hemorrhage, or mass lesion is present. The ventricles are proportionate to the degree of atrophy. No significant extraaxial fluid collection is present. The brainstem and cerebellum are within normal limits. Vascular: Atherosclerotic changes are noted within the cavernous internal carotid arteries bilaterally. There is no hyperdense vessel. Skull: Calvarium is intact. No focal lytic or blastic lesions are present. No significant extracranial soft tissue lesion is present. Sinuses/Orbits: The paranasal sinuses and mastoid air cells are clear. The globes and orbits are within normal limits. IMPRESSION: 1. Left caudate head lacunar infarct is new since the prior study but appears remote. 2. Otherwise stable extensive periventricular and subcortical white matter changes bilaterally consistent chronic microvascular ischemia. 3. No acute intracranial abnormality. Electronically Signed   By: San Morelle M.D.   On: 06/15/2019 11:47   Mr Cervical  Spine Wo Contrast  Result Date: 06/15/2019 CLINICAL DATA:  58 year old male with acute myelopathy. Known aortic dissection, recently treated with stenting. EXAM: MRI CERVICAL AND THORACIC SPINE WITHOUT CONTRAST TECHNIQUE: Multiplanar and multiecho pulse sequences of the cervical spine, to include the craniocervical junction and  cervicothoracic junction, and the thoracic spine, were obtained without intravenous contrast. COMPARISON:  CTA CT Chest, Abdomen, and Pelvis 06/13/2019 and earlier. No prior cervical spine imaging, but brain MRI 12/13/2016. FINDINGS: MRI CERVICAL SPINE FINDINGS Alignment: Straightening of cervical lordosis. No spondylolisthesis. Vertebrae: Degenerative appearing endplate marrow signal changes at C5-C6 with faint marrow edema. No other marrow edema or evidence of acute osseous abnormality. Background bone marrow signal is within normal limits. Cord: Mass effect on the cervical spinal cord at C4-C5 and C5-C6. Is difficult to exclude abnormal cord signal at both levels such as due to myelomalacia due to compressive myelopathy (series 8, image 24 at C5-C6). And there is evidence of abnormal signal on series 8, image 24 greater on the left. Above and below those levels spinal cord signal and morphology is within normal limits. Posterior Fossa, vertebral arteries, paraspinal tissues: Cervicomedullary junction is within normal limits. Negative visible posterior fossa. Preserved major vascular flow voids in the neck. The left vertebral artery appears dominant. Negative neck soft tissues. Disc levels: Degenerative changes notable for: C3-C4: Right eccentric circumferential disc osteophyte complex with mild to moderate right C4 foraminal stenosis. C4-C5: Circumferential disc osteophyte complex with broad-based posterior component. Spinal stenosis with mild to moderate spinal cord mass effect (series 8, image 20). Severe left and moderate to severe right C5 foraminal stenosis. C5-C6: Disc space loss with circumferential disc osteophyte complex. Broad-based posterior component. Mild spinal stenosis and mild to moderate cord mass effect. Severe bilateral C6 foraminal stenosis. C6-C7: Circumferential disc osteophyte complex mostly affecting the neural foramina. Borderline to mild spinal stenosis without cord mass effect. Severe  bilateral C7 foraminal stenosis. C7-T1: Mild to moderate facet hypertrophy greater on the left. Mild to moderate left C8 foraminal stenosis. MRI THORACIC SPINE FINDINGS Thoracic spine segmentation:  Normal on the comparison CT. Alignment:  Preserved thoracic kyphosis.  No spondylolisthesis. Vertebrae: Mild endplate irregularity including superiorly at T8 appears to be stable and chronic. STIR hyperintensity at the inferior T6 endplate appears to be related to a Schmorl's node (series 14, image 10). No acute osseous abnormality identified. Cord: Above T10 the thoracic spinal cord signal and morphology is within normal limits. Beginning at T9-T10 there is suggestion of abnormal increased central cord signal (series 16, image 31), although no definite cord expansion. By the mid T11 level the cord signal normalizes, and the visible conus appears normal, terminating at T12-L1. Fairly capacious thoracic spinal canal. Paraspinal and other soft tissues: Thoracic aortic dissection with evidence of stent redemonstrated on series 16, image 16. Left pleural effusion appears regressed. Negative visible upper abdominal viscera. There is patchy nonspecific T2 and STIR hyperintensity in the midthoracic erector spinal muscles in the midline posteriorly (series 14, image 10 and series 16, image 20). Little if any muscle expansion. Other paraspinal soft tissues are within normal limits. Disc levels: Endplate degeneration but no thoracic spinal stenosis. No discrete thoracic disc herniation. IMPRESSION: 1. 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 2. More questionably abnormal spinal cord at the T9-T10 and T10-T11 level where central cord signal abnormality without cord expansion might indicate a segmental cord infarct in this clinical setting. But the remaining  thoracic spinal cord and conus appear normal. 3.  Nonspecific mild bilateral posterior paraspinal muscle inflammation at the midthoracic level (T6 through T9). 4. Other cervical spine degeneration including up to severe multilevel cervical neural foraminal stenosis, and also mild spinal stenosis at C6-C7. 5. Thoracic aortic dissection and endograft re-demonstrated. Electronically Signed   By: Genevie Ann M.D.   On: 06/15/2019 17:20   Mr Thoracic Spine Wo Contrast  Result Date: 06/15/2019 CLINICAL DATA:  58 year old male with acute myelopathy. Known aortic dissection, recently treated with stenting. EXAM: MRI CERVICAL AND THORACIC SPINE WITHOUT CONTRAST TECHNIQUE: Multiplanar and multiecho pulse sequences of the cervical spine, to include the craniocervical junction and cervicothoracic junction, and the thoracic spine, were obtained without intravenous contrast. COMPARISON:  CTA CT Chest, Abdomen, and Pelvis 06/13/2019 and earlier. No prior cervical spine imaging, but brain MRI 12/13/2016. FINDINGS: MRI CERVICAL SPINE FINDINGS Alignment: Straightening of cervical lordosis. No spondylolisthesis. Vertebrae: Degenerative appearing endplate marrow signal changes at C5-C6 with faint marrow edema. No other marrow edema or evidence of acute osseous abnormality. Background bone marrow signal is within normal limits. Cord: Mass effect on the cervical spinal cord at C4-C5 and C5-C6. Is difficult to exclude abnormal cord signal at both levels such as due to myelomalacia due to compressive myelopathy (series 8, image 24 at C5-C6). And there is evidence of abnormal signal on series 8, image 24 greater on the left. Above and below those levels spinal cord signal and morphology is within normal limits. Posterior Fossa, vertebral arteries, paraspinal tissues: Cervicomedullary junction is within normal limits. Negative visible posterior fossa. Preserved major vascular flow voids in the neck. The left vertebral artery appears dominant. Negative neck soft tissues. Disc levels:  Degenerative changes notable for: C3-C4: Right eccentric circumferential disc osteophyte complex with mild to moderate right C4 foraminal stenosis. C4-C5: Circumferential disc osteophyte complex with broad-based posterior component. Spinal stenosis with mild to moderate spinal cord mass effect (series 8, image 20). Severe left and moderate to severe right C5 foraminal stenosis. C5-C6: Disc space loss with circumferential disc osteophyte complex. Broad-based posterior component. Mild spinal stenosis and mild to moderate cord mass effect. Severe bilateral C6 foraminal stenosis. C6-C7: Circumferential disc osteophyte complex mostly affecting the neural foramina. Borderline to mild spinal stenosis without cord mass effect. Severe bilateral C7 foraminal stenosis. C7-T1: Mild to moderate facet hypertrophy greater on the left. Mild to moderate left C8 foraminal stenosis. MRI THORACIC SPINE FINDINGS Thoracic spine segmentation:  Normal on the comparison CT. Alignment:  Preserved thoracic kyphosis.  No spondylolisthesis. Vertebrae: Mild endplate irregularity including superiorly at T8 appears to be stable and chronic. STIR hyperintensity at the inferior T6 endplate appears to be related to a Schmorl's node (series 14, image 10). No acute osseous abnormality identified. Cord: Above T10 the thoracic spinal cord signal and morphology is within normal limits. Beginning at T9-T10 there is suggestion of abnormal increased central cord signal (series 16, image 31), although no definite cord expansion. By the mid T11 level the cord signal normalizes, and the visible conus appears normal, terminating at T12-L1. Fairly capacious thoracic spinal canal. Paraspinal and other soft tissues: Thoracic aortic dissection with evidence of stent redemonstrated on series 16, image 16. Left pleural effusion appears regressed. Negative visible upper abdominal viscera. There is patchy nonspecific T2 and STIR hyperintensity in the midthoracic erector  spinal muscles in the midline posteriorly (series 14, image 10 and series 16, image 20). Little if any muscle expansion. Other paraspinal soft tissues are within normal limits. Disc  levels: Endplate degeneration but no thoracic spinal stenosis. No discrete thoracic disc herniation. IMPRESSION: 1. 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 2. More questionably abnormal spinal cord at the T9-T10 and T10-T11 level where central cord signal abnormality without cord expansion might indicate a segmental cord infarct in this clinical setting. But the remaining thoracic spinal cord and conus appear normal. 3. Nonspecific mild bilateral posterior paraspinal muscle inflammation at the midthoracic level (T6 through T9). 4. Other cervical spine degeneration including up to severe multilevel cervical neural foraminal stenosis, and also mild spinal stenosis at C6-C7. 5. Thoracic aortic dissection and endograft re-demonstrated. Electronically Signed   By: Genevie Ann M.D.   On: 06/15/2019 17:20   Mr Lumbar Spine Wo Contrast  Result Date: 06/15/2019 CLINICAL DATA:  58 year old male with thoracic aortic dissection recently treated with stent/endograft. Myelopathy. Cervical and thoracic MRI earlier today. EXAM: MRI LUMBAR SPINE WITHOUT CONTRAST TECHNIQUE: Multiplanar, multisequence MR imaging of the lumbar spine was performed. No intravenous contrast was administered. COMPARISON:  Cervical and thoracic MRI earlier today. [place] stent CT Chest, Abdomen, and Pelvis. 06/13/2019 FINDINGS: Segmentation:  Normal. Alignment:  Stable lumbar lordosis. Vertebrae: Background bone marrow signal is within normal limits. There are degenerative endplate marrow signal changes at both L4-L5 and L5-S1, and there is rightward endplate marrow edema at L4-L5 (series 6, image 6), however there is some preserved fatty  degenerative endplate marrow signal at both levels. See additional L4-L5 and L5-S1 details below. No other acute osseous abnormality identified. No sacral edema and negative visible SI joints. Conus medullaris and cauda equina: Conus extends to the T12-L1 level, the lower thoracic spinal cord was described earlier today. Paraspinal and other soft tissues: There is abnormal prevertebral and retroperitoneal material in the lower abdomen and pelvis best seen on series 6, image 9. Some of this appears to be anterior to the aortoiliac bifurcation, but also is located in the lower lumbar and sacral prevertebral space. This seems to be new since the CT on 06/13/2019, but is of unclear etiology and significance. Furthermore, note that although the L4-L5 endplates demonstrate some marrow edema eccentric to the right, the adjacent right psoas muscle seems to remain normal on series 8, image 24. Both psoas muscles appear to remain normal throughout their course. Otherwise abnormal kidneys and fusiform aneurysmal enlargement of the abdominal aorta are redemonstrated. The posterior paraspinal soft tissues appear negative. Disc levels: L1-L2: Mild L1 foraminal stenosis greater on the left related to disc bulging and posterior element hypertrophy. L2-L3: Multifactorial mild to moderate spinal stenosis related to circumferential disc bulge, posterior element hypertrophy, and epidural lipomatosis. Moderate left greater than right L3 foraminal stenosis. L3-L4: Moderate to severe multifactorial spinal stenosis related to circumferential disc bulge, posterior element hypertrophy and some epidural lipomatosis. See series 8, image 19. Moderate bilateral L3 foraminal stenosis. L4-L5: Increased T2 and STIR signal in the central and right disc. There was some vacuum phenomena here on the recent CT. Bulky underlying circumferential disc osteophyte complex eccentric to the right. Moderate posterior element hypertrophy. Mild spinal and  moderate lateral recess stenosis. Mild to moderate left and severe right L4 foraminal stenosis. L5-S1: Similar increased T2 signal in the disc and bulky circumferential disc osteophyte complex although no convincing endplate marrow edema at this level. Mild epidural lipomatosis and posterior element hypertrophy. Moderate to severe bilateral L5 foraminal stenosis. IMPRESSION: 1. The dominant finding is new space-occupying material  in the lower retroperitoneum and lumbosacral junction prevertebral space since the CT on 06/13/19. And although the L4-L5 disc and right endplates appear abnormal, the adjacent psoas muscle seem unaffected. However, in discussing this case just now with Dr. 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 Furthermore, we discussed that the hematocrit has been stable since 06/13/2019 - arguing against retroperitoneal hematoma. 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 No drainable fluid collection/abscess is identified. 2. Underlying lower lumbar spine degeneration with multifactorial moderate to severe spinal and/or neural foraminal stenosis L3-L4 through L5-S1. 3. Abdominal aortic aneurysm and native renal atrophy partially redemonstrated. Electronically Signed   By: Genevie Ann M.D.   On: 06/15/2019 20:29   Dg Chest Port 1 View  Result Date: 06/15/2019 CLINICAL DATA:  Reason for exam: ESRD. Sepsis Hx HTN, pneumonia, wide-complex tachycardia EXAM: PORTABLE CHEST 1 VIEW COMPARISON:  CT of the chest on 06/13/2019 performed at [facility]. Chest x-ray on 06/04/2019 from [place]: Patient has RIGHT-sided dialysis catheter, tip overlying the level of the superior vena cava. Patient has had aortic stent graft since the previous chest x-ray. There is subsegmental atelectasis at both lung bases. CT exclude early LEFT LOWER lobe infiltrate. IMPRESSION: 1.  Bibasilar atelectasis. 2. LEFT LOWER lobe infiltrate cannot be excluded. Electronically Signed   By: Nolon Nations M.D.   On: 06/15/2019 14:43   Medications: . ceFEPime (MAXIPIME) IV 2 g (06/14/19 1810)  . vancomycin 1,000 mg (06/14/19 0920)   . amLODipine  5 mg Oral QHS  . atorvastatin  20 mg Oral q1800  . darbepoetin (ARANESP) injection - DIALYSIS  100 mcg Intravenous Q Sat-HD  . ferric citrate  630 mg Oral TID WC  . hydrALAZINE  100 mg Oral TID  . labetalol  300 mg Oral BID  . losartan  50 mg Oral BID  . minoxidil  2.5 mg Oral BID  . sodium chloride flush  3 mL Intravenous Q12H    Dialysis Orders: [facility]  4h 49min 400/1.5 87.5kg 2/2.25 RIJ TDC Hep 3000  - Parsabiv 2.5mg  IV q HD  - Calcitriol 1.65mcg PO q HD   Assessment/ Plan:  1. Fever/Leukocytosis: S/p recent aortic stent and TDC. Possible pneumonia + proctitis on CT done at [facility]. BCx negative from [place], repeat 8/23 pending. On Vanc/Cefepime.  2. L4-5 Lumbar diskitis: Likely cause of #1. On Vanc/Cefepime. WBC rising. 3. Spinal cord infarct: Per spinal MRI 8/23 after complaining of not being able to move legs. Neuro following. No specific treatment recommended. 4. Hx Type B acute aortic dissection (s/p stent graft repair 8/14): Goal SBP per VVS 120- 170. Known severe resistant HTN. Got too low on 8/23 - better today. 5. ESRD: Continue HD per TTS schedule - next 8/25. 4. Hypertension/volume: Volume improving. BP controlled. 5. Anemia: Hgb 8.3, follow. Aranesp 144mcg last given 8/22, ordered weekly. 6. Metabolic bone disease: Ca/Phos ok. Continue Auryxia, Calcitriol. Parsabiv not available inpatient- follow calcium.  7. Nutrition: Renal diet with fluid restrictions recommended  8. Recent MSSA bacteremia/ AVG infection: Hx infected AVG and had revision in 5/20, then partial resection/ revision in 6/20, then all AVG material (x2) removed 6/30. S/p 6 weeks of Ancef IV completed.   Veneta Penton, PA-C 06/16/2019, 10:51  AM  [facility] Pager: [phone]

## 2019-06-16 NOTE — Progress Notes (Signed)
Pharmacy Antibiotic Note  Frank Fuller is a 58 y.o. male here with sepsis.  He recently had TEVAR and was discharged home. Pharmacy has been consulted for Vancomycin and Cefepime dosing  The patient is ESRD-TTS, last HD 8/22 (3 hr BFR 400), next planned for 8/25. Current dosing remains appropriate.   Plan: - Continue Vancomycin 1g/HD-TTS - Continue Cefepime 2g on TTS @ 1800 - Will continue to follow HD schedule/duration, culture results, LOT, and antibiotic de-escalation plans   Weight: 201 lb 15.1 oz (91.6 kg)  Temp (24hrs), Avg:99.1 F (37.3 C), Min:98.6 F (37 C), Max:99.6 F (37.6 C)  Recent Labs  Lab 06/11/19 0352 06/12/19 0312 06/13/19 1601 06/14/19 0449 06/15/19 0619 06/16/19 0617  WBC 8.7 8.9 17.6* 21.2* 26.1* 27.2*  CREATININE 6.89* 9.29*  --  9.69* 7.81* 10.50*    Estimated Creatinine Clearance: 9.2 mL/min (A) (by C-G formula based on SCr of 10.5 mg/dL (H)).    Allergies  Allergen Reactions  . Oxycodone Nausea Only  . Chlorhexidine Other (See Comments)    Unknown reaction Patch skin test done at dialysis 06/26/17  - staff using clear dressing and alcohol to clean exit site of catheter  . Clonidine Derivatives Other (See Comments)    Dizziness   . Lisinopril Other (See Comments)    unresponsive  . Carvedilol Rash    Vanc 8/21 >> Cefepime 8/21 >>  8/23 BCx >> 8/23 RCx >>  Thank you for allowing pharmacy to be a part of this patient's care.  Alycia Rossetti, PharmD, BCPS Clinical Pharmacist Clinical phone for 06/16/2019: [phone] 06/16/2019 9:58 AM   **Pharmacist phone directory can now be found on [email or web address] ([email or web address]).  Listed under [place].

## 2019-06-16 NOTE — Progress Notes (Signed)
Notified MD Mikhail that pt refused Tramadol. This RN asked if he could take oxycodone and pt stated "it can work". MD gave verbal order for OxyIR 5 mg Q 4H PRN as the patient has it listed as allergy causing only nausea.   Paulla Fore, RN

## 2019-06-16 NOTE — Progress Notes (Addendum)
STROKE TEAM PROGRESS NOTE   INTERVAL HISTORY His nurse tech is at the bedside.  Pt awake alert but still has b/l LE weakness but able to move about 2+/5. However, still has tremendous abdominal pain, back pain and leg pain with movement.    Vitals:   06/15/19 1712 06/15/19 1924 06/15/19 2117 06/16/19 0350  BP: 134/71  (!) 164/105 109/60  Pulse: 91 93 89 81  Resp:  19  19  Temp: 99.2 F (37.3 C) 98.9 F (37.2 C) 99.6 F (37.6 C) 99.1 F (37.3 C)  TempSrc: Oral Oral Oral Oral  SpO2: 98% 98% 97% 97%  Weight:        CBC:  Recent Labs  Lab 06/15/19 0619 06/16/19 0617  WBC 26.1* 27.2*  HGB 8.9* 8.3*  HCT 27.2* 24.8*  MCV 95.4 93.2  PLT 364 [id number]    Basic Metabolic Panel:  Recent Labs  Lab 06/15/19 0619 06/16/19 0617  NA 132* 130*  K 4.5 4.2  CL 92* 91*  CO2 24 24  GLUCOSE 108* 116*  BUN 38* 59*  CREATININE 7.81* 10.50*  CALCIUM 9.1 8.7*  PHOS 5.2* 5.0*   Lipid Panel:     Component Value Date/Time   CHOL 189 05/29/2019 0256   TRIG 220 (H) 06/07/2019 0333   HDL 55 05/29/2019 0256   CHOLHDL 3.4 05/29/2019 0256   VLDL 33 05/29/2019 0256   LDLCALC 101 (H) 05/29/2019 0256   HgbA1c:  Lab Results  Component Value Date   HGBA1C 5.0 06/16/2019   Urine Drug Screen:     Component Value Date/Time   LABOPIA NONE DETECTED 12/11/2016 0545   COCAINSCRNUR NONE DETECTED 12/11/2016 0545   LABBENZ NONE DETECTED 12/11/2016 0545   AMPHETMU NONE DETECTED 12/11/2016 0545   THCU POSITIVE (A) 12/11/2016 0545   LABBARB NONE DETECTED 12/11/2016 0545    Alcohol Level No results found for: ETH  IMAGING Ct Head Wo Contrast  Result Date: 06/15/2019 CLINICAL DATA:  Fatigue and malaise. Hypertension. End-stage renal disease. EXAM: CT HEAD WITHOUT CONTRAST TECHNIQUE: Contiguous axial images were obtained from the base of the skull through the vertex without intravenous contrast. COMPARISON:  MRI of the head 12/13/2016 FINDINGS: Brain: A remote lacunar infarct of the left caudate head  is new, but not acute. Extensive periventricular white matter changes are otherwise stable. Confluent subcortical white matter hypoattenuation is also stable, right greater than left. No acute cortical infarct, hemorrhage, or mass lesion is present. The ventricles are proportionate to the degree of atrophy. No significant extraaxial fluid collection is present. The brainstem and cerebellum are within normal limits. Vascular: Atherosclerotic changes are noted within the cavernous internal carotid arteries bilaterally. There is no hyperdense vessel. Skull: Calvarium is intact. No focal lytic or blastic lesions are present. No significant extracranial soft tissue lesion is present. Sinuses/Orbits: The paranasal sinuses and mastoid air cells are clear. The globes and orbits are within normal limits. IMPRESSION: 1. Left caudate head lacunar infarct is new since the prior study but appears remote. 2. Otherwise stable extensive periventricular and subcortical white matter changes bilaterally consistent chronic microvascular ischemia. 3. No acute intracranial abnormality. Electronically Signed   By: San Morelle M.D.   On: 06/15/2019 11:47   Frank Cervical Spine Wo Contrast  Result Date: 06/15/2019 CLINICAL DATA:  58 year old male with acute myelopathy. Known aortic dissection, recently treated with stenting. EXAM: MRI CERVICAL AND THORACIC SPINE WITHOUT CONTRAST TECHNIQUE: Multiplanar and multiecho pulse sequences of the cervical spine, to include the craniocervical junction  and cervicothoracic junction, and the thoracic spine, were obtained without intravenous contrast. COMPARISON:  CTA CT Chest, Abdomen, and Pelvis 06/13/2019 and earlier. No prior cervical spine imaging, but brain MRI 12/13/2016. FINDINGS: MRI CERVICAL SPINE FINDINGS Alignment: Straightening of cervical lordosis. No spondylolisthesis. Vertebrae: Degenerative appearing endplate marrow signal changes at C5-C6 with faint marrow edema. No other  marrow edema or evidence of acute osseous abnormality. Background bone marrow signal is within normal limits. Cord: Mass effect on the cervical spinal cord at C4-C5 and C5-C6. Is difficult to exclude abnormal cord signal at both levels such as due to myelomalacia due to compressive myelopathy (series 8, image 24 at C5-C6). And there is evidence of abnormal signal on series 8, image 24 greater on the left. Above and below those levels spinal cord signal and morphology is within normal limits. Posterior Fossa, vertebral arteries, paraspinal tissues: Cervicomedullary junction is within normal limits. Negative visible posterior fossa. Preserved major vascular flow voids in the neck. The left vertebral artery appears dominant. Negative neck soft tissues. Disc levels: Degenerative changes notable for: C3-C4: Right eccentric circumferential disc osteophyte complex with mild to moderate right C4 foraminal stenosis. C4-C5: Circumferential disc osteophyte complex with broad-based posterior component. Spinal stenosis with mild to moderate spinal cord mass effect (series 8, image 20). Severe left and moderate to severe right C5 foraminal stenosis. C5-C6: Disc space loss with circumferential disc osteophyte complex. Broad-based posterior component. Mild spinal stenosis and mild to moderate cord mass effect. Severe bilateral C6 foraminal stenosis. C6-C7: Circumferential disc osteophyte complex mostly affecting the neural foramina. Borderline to mild spinal stenosis without cord mass effect. Severe bilateral C7 foraminal stenosis. C7-T1: Mild to moderate facet hypertrophy greater on the left. Mild to moderate left C8 foraminal stenosis. MRI THORACIC SPINE FINDINGS Thoracic spine segmentation:  Normal on the comparison CT. Alignment:  Preserved thoracic kyphosis.  No spondylolisthesis. Vertebrae: Mild endplate irregularity including superiorly at T8 appears to be stable and chronic. STIR hyperintensity at the inferior T6 endplate  appears to be related to a Schmorl's node (series 14, image 10). No acute osseous abnormality identified. Cord: Above T10 the thoracic spinal cord signal and morphology is within normal limits. Beginning at T9-T10 there is suggestion of abnormal increased central cord signal (series 16, image 31), although no definite cord expansion. By the mid T11 level the cord signal normalizes, and the visible conus appears normal, terminating at T12-L1. Fairly capacious thoracic spinal canal. Paraspinal and other soft tissues: Thoracic aortic dissection with evidence of stent redemonstrated on series 16, image 16. Left pleural effusion appears regressed. Negative visible upper abdominal viscera. There is patchy nonspecific T2 and STIR hyperintensity in the midthoracic erector spinal muscles in the midline posteriorly (series 14, image 10 and series 16, image 20). Little if any muscle expansion. Other paraspinal soft tissues are within normal limits. Disc levels: Endplate degeneration but no thoracic spinal stenosis. No discrete thoracic disc herniation. IMPRESSION: 1. 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 2. More questionably abnormal spinal cord at the T9-T10 and T10-T11 level where central cord signal abnormality without cord expansion might indicate a segmental cord infarct in this clinical setting. But the remaining thoracic spinal cord and conus appear normal. 3. Nonspecific mild bilateral posterior paraspinal muscle inflammation at the midthoracic level (T6 through T9). 4. Other cervical spine degeneration including up to severe multilevel cervical neural foraminal stenosis, and also mild spinal stenosis at C6-C7. 5. Thoracic  aortic dissection and endograft re-demonstrated. Electronically Signed   By: Genevie Ann M.D.   On: 06/15/2019 17:20   Frank Thoracic Spine Wo Contrast  Result Date:  06/15/2019 CLINICAL DATA:  58 year old male with acute myelopathy. Known aortic dissection, recently treated with stenting. EXAM: MRI CERVICAL AND THORACIC SPINE WITHOUT CONTRAST TECHNIQUE: Multiplanar and multiecho pulse sequences of the cervical spine, to include the craniocervical junction and cervicothoracic junction, and the thoracic spine, were obtained without intravenous contrast. COMPARISON:  CTA CT Chest, Abdomen, and Pelvis 06/13/2019 and earlier. No prior cervical spine imaging, but brain MRI 12/13/2016. FINDINGS: MRI CERVICAL SPINE FINDINGS Alignment: Straightening of cervical lordosis. No spondylolisthesis. Vertebrae: Degenerative appearing endplate marrow signal changes at C5-C6 with faint marrow edema. No other marrow edema or evidence of acute osseous abnormality. Background bone marrow signal is within normal limits. Cord: Mass effect on the cervical spinal cord at C4-C5 and C5-C6. Is difficult to exclude abnormal cord signal at both levels such as due to myelomalacia due to compressive myelopathy (series 8, image 24 at C5-C6). And there is evidence of abnormal signal on series 8, image 24 greater on the left. Above and below those levels spinal cord signal and morphology is within normal limits. Posterior Fossa, vertebral arteries, paraspinal tissues: Cervicomedullary junction is within normal limits. Negative visible posterior fossa. Preserved major vascular flow voids in the neck. The left vertebral artery appears dominant. Negative neck soft tissues. Disc levels: Degenerative changes notable for: C3-C4: Right eccentric circumferential disc osteophyte complex with mild to moderate right C4 foraminal stenosis. C4-C5: Circumferential disc osteophyte complex with broad-based posterior component. Spinal stenosis with mild to moderate spinal cord mass effect (series 8, image 20). Severe left and moderate to severe right C5 foraminal stenosis. C5-C6: Disc space loss with circumferential disc  osteophyte complex. Broad-based posterior component. Mild spinal stenosis and mild to moderate cord mass effect. Severe bilateral C6 foraminal stenosis. C6-C7: Circumferential disc osteophyte complex mostly affecting the neural foramina. Borderline to mild spinal stenosis without cord mass effect. Severe bilateral C7 foraminal stenosis. C7-T1: Mild to moderate facet hypertrophy greater on the left. Mild to moderate left C8 foraminal stenosis. MRI THORACIC SPINE FINDINGS Thoracic spine segmentation:  Normal on the comparison CT. Alignment:  Preserved thoracic kyphosis.  No spondylolisthesis. Vertebrae: Mild endplate irregularity including superiorly at T8 appears to be stable and chronic. STIR hyperintensity at the inferior T6 endplate appears to be related to a Schmorl's node (series 14, image 10). No acute osseous abnormality identified. Cord: Above T10 the thoracic spinal cord signal and morphology is within normal limits. Beginning at T9-T10 there is suggestion of abnormal increased central cord signal (series 16, image 31), although no definite cord expansion. By the mid T11 level the cord signal normalizes, and the visible conus appears normal, terminating at T12-L1. Fairly capacious thoracic spinal canal. Paraspinal and other soft tissues: Thoracic aortic dissection with evidence of stent redemonstrated on series 16, image 16. Left pleural effusion appears regressed. Negative visible upper abdominal viscera. There is patchy nonspecific T2 and STIR hyperintensity in the midthoracic erector spinal muscles in the midline posteriorly (series 14, image 10 and series 16, image 20). Little if any muscle expansion. Other paraspinal soft tissues are within normal limits. Disc levels: Endplate degeneration but no thoracic spinal stenosis. No discrete thoracic disc herniation. IMPRESSION: 1. 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 2. More questionably abnormal spinal cord at the T9-T10  and T10-T11 level where central cord signal abnormality without cord expansion might indicate a segmental cord infarct in this clinical setting. But the remaining thoracic spinal cord and conus appear normal. 3. Nonspecific mild bilateral posterior paraspinal muscle inflammation at the midthoracic level (T6 through T9). 4. Other cervical spine degeneration including up to severe multilevel cervical neural foraminal stenosis, and also mild spinal stenosis at C6-C7. 5. Thoracic aortic dissection and endograft re-demonstrated. Electronically Signed   By: Genevie Ann M.D.   On: 06/15/2019 17:20   Frank Lumbar Spine Wo Contrast  Result Date: 06/15/2019 CLINICAL DATA:  58 year old male with thoracic aortic dissection recently treated with stent/endograft. Myelopathy. Cervical and thoracic MRI earlier today. EXAM: MRI LUMBAR SPINE WITHOUT CONTRAST TECHNIQUE: Multiplanar, multisequence Frank imaging of the lumbar spine was performed. No intravenous contrast was administered. COMPARISON:  Cervical and thoracic MRI earlier today. Houck stent CT Chest, Abdomen, and Pelvis. 06/13/2019 FINDINGS: Segmentation:  Normal. Alignment:  Stable lumbar lordosis. Vertebrae: Background bone marrow signal is within normal limits. There are degenerative endplate marrow signal changes at both L4-L5 and L5-S1, and there is rightward endplate marrow edema at L4-L5 (series 6, image 6), however there is some preserved fatty degenerative endplate marrow signal at both levels. See additional L4-L5 and L5-S1 details below. No other acute osseous abnormality identified. No sacral edema and negative visible SI joints. Conus medullaris and cauda equina: Conus extends to the T12-L1 level, the lower thoracic spinal cord was described earlier today. Paraspinal and other soft tissues: There is abnormal prevertebral and retroperitoneal material in the lower  abdomen and pelvis best seen on series 6, image 9. Some of this appears to be anterior to the aortoiliac bifurcation, but also is located in the lower lumbar and sacral prevertebral space. This seems to be new since the CT on 06/13/2019, but is of unclear etiology and significance. Furthermore, note that although the L4-L5 endplates demonstrate some marrow edema eccentric to the right, the adjacent right psoas muscle seems to remain normal on series 8, image 24. Both psoas muscles appear to remain normal throughout their course. Otherwise abnormal kidneys and fusiform aneurysmal enlargement of the abdominal aorta are redemonstrated. The posterior paraspinal soft tissues appear negative. Disc levels: L1-L2: Mild L1 foraminal stenosis greater on the left related to disc bulging and posterior element hypertrophy. L2-L3: Multifactorial mild to moderate spinal stenosis related to circumferential disc bulge, posterior element hypertrophy, and epidural lipomatosis. Moderate left greater than right L3 foraminal stenosis. L3-L4: Moderate to severe multifactorial spinal stenosis related to circumferential disc bulge, posterior element hypertrophy and some epidural lipomatosis. See series 8, image 19. Moderate bilateral L3 foraminal stenosis. L4-L5: Increased T2 and STIR signal in the central and right disc. There was some vacuum phenomena here on the recent CT. Bulky underlying circumferential disc osteophyte complex eccentric to the right. Moderate posterior element hypertrophy. Mild spinal and moderate lateral recess stenosis. Mild to moderate left and severe right L4 foraminal stenosis. L5-S1: Similar increased T2 signal in the disc and bulky circumferential disc osteophyte complex although no convincing endplate marrow edema at this level. Mild epidural lipomatosis and posterior element hypertrophy. Moderate to severe bilateral L5 foraminal stenosis. IMPRESSION: 1. The dominant finding is new space-occupying material in  the lower retroperitoneum and lumbosacral junction prevertebral space since the CT on 06/13/19. And although the L4-L5 disc and right endplates appear abnormal, the adjacent psoas muscle seem unaffected. However, in discussing this case just now with Dr. Roland Rack he advises this patient  is dialysis dependent, is currently febrile and has blood cultures pending. Furthermore, we discussed that the hematocrit has been stable since 06/13/2019 - arguing against retroperitoneal hematoma. 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 No drainable fluid collection/abscess is identified. 2. Underlying lower lumbar spine degeneration with multifactorial moderate to severe spinal and/or neural foraminal stenosis L3-L4 through L5-S1. 3. Abdominal aortic aneurysm and native renal atrophy partially redemonstrated. Electronically Signed   By: Genevie Ann M.D.   On: 06/15/2019 20:29   Dg Chest Port 1 View  Result Date: 06/15/2019 CLINICAL DATA:  Reason for exam: ESRD. Sepsis Hx HTN, pneumonia, wide-complex tachycardia EXAM: PORTABLE CHEST 1 VIEW COMPARISON:  CT of the chest on 06/13/2019 performed at [facility]. Chest x-ray on 06/04/2019 from [place]: Patient has RIGHT-sided dialysis catheter, tip overlying the level of the superior vena cava. Patient has had aortic stent graft since the previous chest x-ray. There is subsegmental atelectasis at both lung bases. CT exclude early LEFT LOWER lobe infiltrate. IMPRESSION: 1. Bibasilar atelectasis. 2. LEFT LOWER lobe infiltrate cannot be excluded. Electronically Signed   By: Nolon Nations M.D.   On: 06/15/2019 14:43    PHYSICAL EXAM  Temp:  [98.1 F (36.7 C)-99.6 F (37.6 C)] 98.1 F (36.7 C) (08/24 1019) Pulse Rate:  [81-96] 85 (08/24 1019) Resp:  [19-20] 20 (08/24 1019) BP: (109-164)/(60-105) 128/66 (08/24 1019) SpO2:  [97 %-99 %] 98 % (08/24 1019)  General - Well nourished, well  developed, in no apparent distress.  Ophthalmologic - fundi not visualized due to noncooperation.  Cardiovascular - Regular rate and rhythm.  Mental Status -  Level of arousal and orientation to time, place, and person were intact. Language including expression, naming, repetition, comprehension was assessed and found intact.  Cranial Nerves II - XII - II - Visual field intact OU. III, IV, VI - Extraocular movements intact. V - Facial sensation intact bilaterally. VII - Facial movement intact bilaterally. VIII - Hearing & vestibular intact bilaterally. X - Palate elevates symmetrically. XI - Chin turning & shoulder shrug intact bilaterally. XII - Tongue protrusion intact.  Motor Strength - The patient's strength was normal in upper extremities and pronator drift was absent. However, b/l LEs proximal 2+/5, DF and PF 3/5, but with tremendous leg pain with LE movement.  Bulk was normal and fasciculations were absent.   Motor Tone - Muscle tone was assessed at the neck and appendages and was normal.  Reflexes - The patient's reflexes were 1+ in LEs and he had no pathological reflexes.  Sensory - Light touch, pinprick were assessed and were symmetrical, no significant sensory level. Priopreception seems decreased bilaterally but difficulty to exam due to pain with joint movement.     Coordination - The patient had normal movements in the hands with no ataxia or dysmetria.  However, b/l UE asterixis.  Gait and Station - deferred.   ASSESSMENT/PLAN Frank. Narayan Fuller is a 58 y.o. male with history of hypertension, end-stage renal disease on hemodialysis, HCV, depression, CVA, recent AAA dissection status post stent on 8/14 discharged on 8/20 unable to walk since d/c presenting with abd pain, back pain, fever, unable to move lets. Admitted for sepsis d/t PNA, ileus, proctitis. 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 Severe multilevel cervical neural foraminal stenosis and mild spinal stenosis C6-7. Thoracic aortic dissection and endograft seen  Recommend vascular surgery consultation to evaluate aortic dissection and endograft   LDL 101  HgbA1c 5.0  SCDs for VTE prophylaxis  No antithrombotic prior to admission, now on No antithrombotic. Recommend ASA either 81 or 325 if OK with vascular surgery   Therapy recommendations:  pending  Disposition:  pending   Aortic dissection s/p repair   Pt had admission for aortic dissection repair 8/14-8/20  Discharged in good condition without leg weakness  However, 2h after discharge, readmitted with abdominal pain, back pain and leg weakness  Also has tremendous pain currently with abdomen, back and b/l legs with movement  Recommend vascular surgery consult for evaluation  Fever / sepsis  Tmax 101.1 this admission  Currently afebrile   possibly secondary to pneumonia/proctititis/ileus, questionable cellulitis  Blood culture in [place] negative  Repeat Blood culture pending  Leukocytosis WBC 17.6->21.2->26.1->27.2  On cefepime and vanco  Does not feel to be endocarditis  Agree with ID involvement   Hx stroke/TIA  11/2016 - Stroke: small L frontal ischemic infarct in setting of HTN, infarct secondary to small vessel disease. MRA h/n negative. EF 50-55%, LDL 69 and A1C 5.2. No asa d/t thrombocytopenia at that time but now resolved.  HTN  Stable  Goal 120-140 due to aortic dissection  Now on novasc, hydralazine, labetalol, losartan and minoxidil  BP monitoring  Hyperlipidemia  Home  meds:  No statin  LDL 101, goal < 70  Add lipitor 20  Continue statin at discharge  Tobacco abuse  Current smoker  Smoking cessation counseling provided  Pt is willing to quit  Other Stroke Risk Factors  ESRD on HD Cre 10.5  Hx Substance abuse   Other Active Problems  Anemia of chronic disease  LUE swelling, neg DVT  Cervical myelomalacia - chronic on MRI C-spine - outpt follow up with [facility] day # 3  I spent  35 minutes in total face-to-face time with the patient, more than 50% of which was spent in counseling and coordination of care, reviewing test results, images and medication, and discussing the diagnosis of aortic dissection s/p repair, spinal cord infarct, fever, sepsis, ESRD on HD, treatment plan and potential prognosis. This patient's care requiresreview of multiple databases, neurological assessment, discussion with family, other specialists and medical decision making of high complexity. I have discussed with Dr. Ree Kida.   Rosalin Hawking, MD PhD Stroke Neurology 06/16/2019 10:56 AM  To contact Stroke Continuity provider, please refer to [email or web address] After hours, contact General Neurology

## 2019-06-16 NOTE — Consult Note (Signed)
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 on 06/13/2019 performed at [facility]. Chest x-ray on 06/04/2019 from [place]: Patient has RIGHT-sided dialysis catheter, tip overlying the level of the superior vena cava. Patient has had aortic stent graft since the previous chest x-ray. There is subsegmental atelectasis at both lung bases. CT exclude early LEFT LOWER lobe infiltrate. IMPRESSION: 1. Bibasilar atelectasis. 2. LEFT LOWER lobe infiltrate cannot be excluded. Electronically Signed   By: Nolon Nations M.D.   On: 06/15/2019 14:43   Dg Chest Port 1 View  Result Date: 06/04/2019 CLINICAL DATA:  Aortic dissection. EXAM: PORTABLE CHEST 1 VIEW COMPARISON:  Chest x-ray from yesterday. FINDINGS: Unchanged tunneled right internal jugular dialysis catheter and left internal jugular central venous catheter. Stable cardiomediastinal silhouette and changes of aortic dissection with medial displacement of the aortic arch atherosclerotic calcifications. No focal consolidation, pleural effusion, or pneumothorax.  No acute osseous abnormality. IMPRESSION: 1. Stable changes of known aortic dissection. Electronically Signed   By: Titus Dubin M.D.   On: 06/04/2019 08:41   Dg Chest Port 1 View  Result Date: 06/03/2019 CLINICAL DATA:  Check central line placement EXAM: PORTABLE CHEST 1 VIEW COMPARISON:  Film from earlier in the same day. FINDINGS: Left jugular central line is now been placed with the catheter tip in the mid superior vena cava. No pneumothorax is seen. Lungs remain clear. Changes of aortic dissection are again seen. Cardiac shadow is stable. IMPRESSION: No pneumothorax following central line placement. Electronically Signed   By: Inez Catalina M.D.   On: 06/03/2019 20:51   Dg Chest Port 1 View  Result Date: 05/26/2019 CLINICAL DATA:  Shortness of breath. Aortic dissection. EXAM: PORTABLE CHEST 1 VIEW COMPARISON:  CT scan of the chest dated 05/26/2019 and chest x-ray dated 04/22/2019 FINDINGS: Chronic cardiomegaly. Double lumen central venous catheter in good position, unchanged. Pulmonary vascularity is normal. The patchy infiltrate in the posterior aspect of the right upper lobe present on the prior chest x-ray has resolved. The irregular area of infiltrate in the anterior aspect of  the right upper lobe noted on the prior CT scan persists. There is slight peribronchial thickening on the left. No effusions. No acute bone abnormality. IMPRESSION: 1. Clearing of the patchy infiltrate in the posterior aspect of the right upper lobe. 2. Persistent small infiltrate in the anterior aspect of the right upper lobe. 3. Bronchitic changes. 4. Chronic cardiomegaly. Electronically Signed   By: Lorriane Shire M.D.   On: 05/26/2019 14:55   Ct Angio Chest/abd/pel For Dissection W And/or W/wo  Result Date: 06/03/2019 CLINICAL DATA:  Known dissection, elevated blood pressure EXAM: CT ANGIOGRAPHY CHEST, ABDOMEN AND PELVIS TECHNIQUE: Multidetector CT imaging through the chest, abdomen and pelvis was performed using the  standard protocol during bolus administration of intravenous contrast. Multiplanar reconstructed images and MIPs were obtained and reviewed to evaluate the vascular anatomy. CONTRAST:  163mL OMNIPAQUE IOHEXOL 350 MG/ML SOLN COMPARISON:  Numerous priors most recent angiography May 30, 2019 and May 26, 2019 FINDINGS: CTA CHEST FINDINGS Cardiovascular: Noncontrast CT images demonstrate displaced intimal calcification of the thoracic aortic arch. The aortic root is incompletely evaluated due to cardiac pulsation artifact though there is gross preservation of the sino-tubular junction. Ascending aorta is normal caliber. There is a common origin of the right brachiocephalic and left common carotid. Post-contrast CT angiographic images demonstrate proximal migration of the previously seen dissection now beginning at the ostium of the left subclavian artery and continuing inferiorly towards the aortic hiatus. There is non opacification of the false lumen within the proximal descending thoracic aorta with progressive narrowing of the true lumen when compared to prior study from May 30, 2019 measuring only 13 x 25 mm in caliber, previously 18 x 28 when measured at a similar level. Conversely there has been increasing aortic caliber now measuring up to 4.5 cm in maximum diameter, previously 4.1 cm. Dissection flap terminus is at the level of the diaphragmatic hiatus with small fenestration. No involvement of the celiac trunk. Mediastinum/Nodes: There is increasing hazy stranding in the region of the mediastinum adjacent the aorta in the region of the ligamentum arteriosum near the [place] a aortic dissection. No enlarged mediastinal or axillary lymph nodes. Thyroid gland is unremarkable. Secretions are present in the trachea and proximal airways. The esophagus is patulous and fluid-filled. Lungs/Pleura: Dependent atelectatic changes are seen posteriorly. Mild centrilobular emphysema. Increasing size of a left  pleural effusion. Airspace opacities are again seen in the anterior (12/66) and posterolateral right upper lobe (12/90), not significantly changed from prior study. Musculoskeletal: No chest wall mass or suspicious bone lesions identified. Review of the MIP images confirms the above findings. CTA ABDOMEN AND PELVIS FINDINGS VASCULAR Aorta: Mixture of calcified and noncalcified atheromatous plaque is present within the abdominal aorta. Aneurysmal outpouching of the infrarenal abdominal aortic aneurysm measures up to 3.5 cm in maximal diameter. Standard branching of the abdominal aorta. No dissection involvement of the celiac trunk ostia. Celiac: Calcification in the proximal celiac axis. No dissection propagation occlusion or stenosis. No vasculitis. SMA: Patent without evidence of aneurysm, dissection, vasculitis or significant stenosis. Renals: Both renal arteries are patent without evidence of aneurysm, dissection, vasculitis, fibromuscular dysplasia or significant stenosis. IMA: Patent without evidence of aneurysm, dissection, vasculitis or significant stenosis. Inflow: Small dissection flap is noted in the proximal left internal iliac artery in the region of irregular atheromatous plaque (10/261) additional dissection flap is present in the proximal right common femoral artery (10/289). Neither is significantly changed from previous exams. Remaining segments in the inflow are heavily diseased but  without aneurysm or ectasia or other acute vascular finding. Veins: Major venous structures are unremarkable. Review of the MIP images confirms the above findings. NON-VASCULAR Hepatobiliary: No focal liver abnormality is seen. No gallstones, gallbladder wall thickening, or biliary dilatation. Vicarious extravasation of contrast medium is present within the gallbladder. Pancreas: Unremarkable. No pancreatic ductal dilatation or surrounding inflammatory changes. Spleen: Normal in size without focal abnormality.  Adrenals/Urinary Tract: Adrenal glands are unremarkable. Symmetric renal atrophy. No concerning renal lesions. No hydronephrosis. High attenuation material in the bladder likely excreted contrast. Mild bladder wall thickening, likely related to underdistention. Stomach/Bowel: Distal esophagus, stomach and duodenal sweep are unremarkable. No bowel wall thickening or dilatation. No evidence of obstruction. Scattered colonic diverticula without focal pericolonic inflammation to suggest diverticulitis. Lymphatic: No enlarged lymph nodes. Reproductive: The prostate and seminal vesicles are unremarkable. Other: No abdominopelvic free fluid or free gas. No bowel containing hernias. Musculoskeletal: Multilevel degenerative changes are present in the imaged portions of the spine. Findings are maximal at L5-S1. Review of the MIP images confirms the above findings. IMPRESSION: 1. Proximal migration of the previously seen dissection now beginning at the ostium of the left subclavian artery and continuing inferiorly towards the aortic hiatus. There is non opacification of the false lumen within the proximal descending thoracic aorta with progressive narrowing of the true lumen. Conversely there has been increasing aortic caliber now measuring up to 4.5 cm in maximum diameter, previously 4.1 cm. 2. Increasing mediastinal hazy stranding adjacent the aortic dissection with enlarging left pleural effusion are features highly concerning for pending rupture. Urgent surgical evaluation is warranted. 3. Dissection flap terminus is at the level of the diaphragmatic hiatus with small fenestration. No involvement of the celiac trunk. 4. Unchanged small dissection flap in the proximal left internal iliac and proximal right common femoral arteries. 5. Persistent airspace opacities in the right upper lobe. 6. Vicarious extravasation of contrast within the gallbladder. 7. Symmetric renal atrophy. 8. Emphysema (ICD10-J43.9). 9. Aortic  Atherosclerosis (ICD10-I70.0). 10.  Aortic aneurysm NOS (ICD10-I71.9). These results were called by telephone at the time of interpretation on 06/03/2019 at 7:12 pm to Dr. Charlesetta Shanks , who verbally acknowledged these results. Electronically Signed   By: Lovena Le M.D.   On: 06/03/2019 19:16   Ct Angio Chest/abd/pel For Dissection W And/or W/wo  Result Date: 05/30/2019 CLINICAL DATA:  Follow-up thoracic aortic dissection EXAM: CT ANGIOGRAPHY CHEST, ABDOMEN AND PELVIS TECHNIQUE: Multidetector CT imaging through the chest, abdomen and pelvis was performed using the standard protocol during bolus administration of intravenous contrast. Multiplanar reconstructed images and MIPs were obtained and reviewed to evaluate the vascular anatomy. CONTRAST:  158mL OMNIPAQUE IOHEXOL 350 MG/ML SOLN COMPARISON:  05/26/2019 FINDINGS: CTA CHEST FINDINGS Cardiovascular: Preferential opacification of the thoracic aorta. No change in caliber or configuration of the dissected descending thoracic aorta, dissection flap originating at the distal descending thoracic aorta and extending to the diaphoretic hiatus, just proximal to the origin of the celiac axis. The descending thoracic aorta measures up to 4.1 x 3.6 cm in the mid descending portion of the vessel. Normal contour and caliber of the aortic root and proximal aorta, with moderate mixed calcific atherosclerosis. There is very limited opacification of the false lumen proximally and there is fenestration distally with retrograde opacification of the false lumen. Mild cardiomegaly. Scattered coronary artery calcifications. No pericardial effusion. Mediastinum/Nodes: No enlarged mediastinal, hilar, or axillary lymph nodes. Thyroid gland, trachea, and esophagus demonstrate no significant findings. Lungs/Pleura: Mild centrilobular emphysema. Trace left pleural effusion.  Unchanged irregular opacity in the central right upper lobe (series 3, image 71) and inferior peripheral right  upper lobe (series 13, image 90). Bibasilar atelectasis. No pleural effusion or pneumothorax. Musculoskeletal: No chest wall abnormality. No acute or significant osseous findings. Review of the MIP images confirms the above findings. 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 Standard branching pattern of the abdominal aorta without dissection the involvement of the branch vessel origins. Review of the MIP images confirms the above findings. NON-VASCULAR Hepatobiliary: No solid liver abnormality is seen. No gallstones, gallbladder wall thickening, or biliary dilatation. Pancreas: Unremarkable. No pancreatic ductal dilatation or surrounding inflammatory changes. Spleen: Normal in size without significant abnormality. Adrenals/Urinary Tract: Adrenal glands are unremarkable. Numerous small bilateral renal lesions, some which are clearly fluid attenuation cysts, others incompletely characterized. These were better evaluated by prior CT dated 04/12/2019. Bladder is unremarkable. Stomach/Bowel: Stomach is within normal limits. Appendix appears normal. No evidence of bowel wall thickening, distention, or inflammatory changes. Lymphatic: No significant vascular findings are present. Reproductive: No mass or other significant abnormality. Other: No abdominal wall hernia or abnormality. No abdominopelvic ascites. Musculoskeletal: No acute or significant osseous findings. Review of the MIP images confirms the above findings. IMPRESSION: 1. No change in caliber or configuration of the dissected descending thoracic aorta, dissection flap originating at the distal descending thoracic aorta and extending to the diaphoretic hiatus, just proximal to the origin of the celiac axis. The descending thoracic aorta measures up to 4.1 x 3.6 cm in the mid descending portion of the vessel. Normal contour  and caliber of the aortic root and proximal aorta, with moderate mixed calcific atherosclerosis. There is very limited opacification of the false lumen proximally and there is fenestration distally with retrograde opacification of the false lumen. 2. 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 3. Mild centrilobular emphysema. Trace left pleural effusion. Unchanged irregular opacity in the central right upper lobe (series 3, image 71) and inferior peripheral right upper lobe (series 13, image 90), generally infectious or inflammatory. Attention on follow-up. 4.  Coronary artery disease. Electronically Signed   By: Eddie Candle M.D.   On: 05/30/2019 08:55   Vas [place] Upper Extremity Venous Duplex  Result Date: 06/09/2019 UPPER VENOUS STUDY  Indications: Edema Risk Factors: None identified. Comparison Study: No prior studies. Performing Technologist: Oliver Hum RVT  Examination Guidelines: A complete evaluation includes B-mode imaging, spectral Doppler, color Doppler, and power Doppler as needed of all accessible portions of each vessel. Bilateral testing is considered an integral part of a complete examination. Limited examinations for reoccurring indications may be performed as noted.  Right Findings: +----------+------------+---------+-----------+----------+-------+  RIGHT      Compressible Phasicity Spontaneous Properties Summary  +----------+------------+---------+-----------+----------+-------+  Subclavian     Full        Yes        Yes                         +----------+------------+---------+-----------+----------+-------+  Left Findings: +----------+------------+---------+-----------+----------+-------+  LEFT       Compressible Phasicity Spontaneous Properties Summary  +----------+------------+---------+-----------+----------+-------+  IJV            Full        Yes        Yes                          +----------+------------+---------+-----------+----------+-------+  Subclavian     Full        Yes        Yes                         +----------+------------+---------+-----------+----------+-------+  Axillary       Full        Yes        Yes                         +----------+------------+---------+-----------+----------+-------+  Brachial       Full        Yes        Yes                         +----------+------------+---------+-----------+----------+-------+  Radial         Full                                               +----------+------------+---------+-----------+----------+-------+  Ulnar          Full                                               +----------+------------+---------+-----------+----------+-------+  Cephalic       Full                                               +----------+------------+---------+-----------+----------+-------+  Basilic        Full                                               +----------+------------+---------+-----------+----------+-------+  Summary:  Right: No evidence of thrombosis in the subclavian.  Left: No evidence of deep vein thrombosis in the upper extremity. No evidence of superficial vein thrombosis in the upper extremity.  *See table(s) above for measurements and observations.  Diagnosing physician: Servando Snare MD Electronically signed by Servando Snare MD on 06/09/2019 at 2:17:27 PM.    Final    Hybrid Or Imaging (mc Only)  Result Date: 06/06/2019 There is no interpretation for this exam.  This order is for images obtained during a surgical procedure.  Please See "Surgeries" Tab for more information regarding the procedure.    Labs:  CBC: Recent Labs    06/13/19 1601 06/14/19 0449 06/15/19 0619 06/16/19 0617  WBC 17.6* 21.2* 26.1* 27.2*  HGB 9.2* 8.0* 8.9* 8.3*  HCT 27.8* 24.8* 27.2* 24.8*  PLT 324 321 364 375    COAGS: Recent Labs    04/22/19 0145 04/23/19 0356 06/03/19 2332 06/05/19 0252  INR 1.2 1.2 1.3* 1.3*  APTT 42*  --    --   --     BMP: Recent Labs    06/12/19 0312 06/14/19 0449 06/15/19 0619 06/16/19 0617  NA 132* 130* 132* 130*  K 4.1 4.5 4.5 4.2  CL 94* 91* 92* 91*  CO2 24 22 24  24  GLUCOSE 105* 85 108* 116*  BUN 55* 50* 38* 59*  CALCIUM 9.1 8.6* 9.1 8.7*  CREATININE 9.29* 9.69* 7.81* 10.50*  GFRNONAA 6* 5* 7* 5*  GFRAA 6* 6* 8* 5*    LIVER FUNCTION TESTS: Recent Labs    11/11/18 1116 04/12/19 1100 04/14/19 1420  05/27/19 0538  06/06/19 1243 06/07/19 0333 06/15/19 0619 06/16/19 0617  BILITOT 1.2 0.7 0.6  --  0.5  --   --   --   --   --   AST 20 19 27   --  12*  --   --   --   --   --   ALT 13 8 13   --  <5  --   --   --   --   --   ALKPHOS 71 70 75  --  55  --   --   --   --   --   PROT 7.0 7.9 7.3  --  7.0  --   --   --   --   --   ALBUMIN 3.6 3.9 3.4*   < > 3.3*   < > 2.4* 2.9* 2.0* 1.9*   < > = values in this interval not displayed.    TUMOR MARKERS: No results for input(s): AFPTM, CEA, CA199, CHROMGRNA in the last 8760 hours.  Assessment and Plan:  Recent Ao dissection/stent ESRD-- Tunneled dialysis catheter placement Back pain Fever; leukocytosis--- on going IV Vanco and Cefepime MRI yesterday showing discitis L4-5 Scheduled for L4-5 disc aspiration in IR 8/25 Risks and benefits of L4-5 disc aspiration was discussed with the patient and/or patient's family including, but not limited to bleeding, infection, damage to adjacent structures or low yield requiring additional tests.  All of the questions were answered and there is agreement to proceed. Consent signed and in chart.  Thank you for this interesting consult.  I greatly enjoyed meeting Jamain Cotte and look forward to participating in their care.  A copy of this report was sent to the requesting provider on this date.  Electronically Signed: Lavonia Drafts, PA-C 06/16/2019, 12:39 PM   I spent a total of 40 Minutes    in face to face in clinical consultation, greater than 50% of which was  counseling/coordinating care for L4-5 disc aspiration

## 2019-06-16 NOTE — Progress Notes (Signed)
Inpatient Rehabilitation Admissions Coordinator  Inpatient rehab consult received. I have ordered PT and OT evals to assess and then I will meet with pt for rehab needs for CIR. Pt was Mod I 60 feet without assistive device before d/c on 06/11/2019.  Danne Baxter, RN, MSN Rehab Admissions Coordinator [phone] 06/16/2019 4:48 PM
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
## 2019-06-16 NOTE — Care Management Important Message (Signed)
Important Message  Patient Details  Name: Frank Fuller MRN: [id number] Date of Birth: [birth year]   Medicare Important Message Given:  Yes     Tannya Gonet [address] 06/16/2019, 3:34 PM

## 2019-06-16 NOTE — Consult Note (Signed)
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 spine was performed. No intravenous contrast was administered. COMPARISON:  Cervical and thoracic MRI earlier today. [place] stent CT Chest, Abdomen, and Pelvis. 06/13/2019 FINDINGS: Segmentation:  Normal. Alignment:  Stable lumbar lordosis. Vertebrae: Background bone marrow signal is within normal limits. There are degenerative endplate marrow signal changes at both L4-L5 and L5-S1, and there is rightward endplate marrow edema at L4-L5 (series 6, image 6), however there is some preserved fatty degenerative endplate marrow signal at both levels. See additional L4-L5 and L5-S1 details below. No other acute osseous abnormality identified. No sacral edema and negative visible SI joints. Conus medullaris and cauda equina: Conus extends to the T12-L1 level, the lower thoracic spinal cord was described earlier today. Paraspinal and other soft tissues: There is abnormal prevertebral and retroperitoneal material in the lower  abdomen and pelvis best seen on series 6, image 9. Some of this appears to be anterior to the aortoiliac bifurcation, but also is located in the lower lumbar and sacral prevertebral space. This seems to be new since the CT on 06/13/2019, but is of unclear etiology and significance. Furthermore, note that although the L4-L5 endplates demonstrate some marrow edema eccentric to the right, the adjacent right psoas muscle seems to remain normal on series 8, image 24. Both psoas muscles appear to remain normal throughout their course. Otherwise abnormal kidneys and fusiform aneurysmal enlargement of the abdominal aorta are redemonstrated. The posterior paraspinal soft tissues appear negative. Disc levels: L1-L2: Mild L1 foraminal stenosis greater on the left related to disc bulging and posterior element hypertrophy. L2-L3: Multifactorial mild to moderate spinal stenosis related to circumferential disc bulge, posterior element hypertrophy, and epidural lipomatosis. Moderate left greater than right L3 foraminal stenosis. L3-L4: Moderate to severe multifactorial spinal stenosis related to circumferential disc bulge, posterior element hypertrophy and some epidural lipomatosis. See series 8, image 19. Moderate bilateral L3 foraminal stenosis. L4-L5: Increased T2 and STIR signal in the central and right disc. There was some vacuum phenomena here on the recent CT. Bulky underlying circumferential disc osteophyte complex eccentric to the right. Moderate posterior element hypertrophy. Mild spinal and moderate lateral recess stenosis. Mild to moderate left and severe right L4 foraminal stenosis. L5-S1: Similar increased T2 signal in the disc and bulky circumferential disc osteophyte complex although no convincing endplate marrow edema at this level. Mild epidural lipomatosis and posterior element hypertrophy. Moderate to severe bilateral L5 foraminal stenosis. IMPRESSION: 1. The dominant finding is new space-occupying material in  the lower retroperitoneum and lumbosacral junction prevertebral space since the CT on 06/13/19. And although the L4-L5 disc and right endplates appear abnormal, the adjacent psoas muscle seem unaffected. However, in discussing this case just now with Dr. 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 Furthermore, we discussed that the hematocrit has been stable since 06/13/2019 - arguing against retroperitoneal hematoma. Therefore, I favor a diagnosis of Acute  Discitis Osteomyelitis at L4-L5 with prevertebral phlegmon. No drainable fluid collection/abscess is identified. 2. Underlying lower lumbar spine degeneration with multifactorial moderate to severe spinal and/or neural foraminal stenosis L3-L4 through L5-S1. 3. Abdominal aortic aneurysm and native renal atrophy partially redemonstrated. Electronically Signed   By: Genevie Ann M.D.   On: 06/15/2019 20:29   Dg Chest Port 1 View  Result Date: 06/15/2019 CLINICAL DATA:  Reason for exam: ESRD. Sepsis Hx HTN, pneumonia, wide-complex tachycardia EXAM: PORTABLE CHEST 1 VIEW COMPARISON:  CT of the chest on 06/13/2019 performed at [facility]. Chest x-ray on 06/04/2019 from [place]: Patient has RIGHT-sided dialysis catheter, tip overlying the level of the superior vena cava. Patient has had aortic stent graft since the previous chest x-ray. There is subsegmental atelectasis at both lung bases. CT exclude early LEFT LOWER lobe infiltrate. IMPRESSION: 1. Bibasilar atelectasis. 2. LEFT LOWER lobe infiltrate cannot be excluded. Electronically Signed   By: Nolon Nations M.D.   On: 06/15/2019 14:43    ROS: As above, he denies speech difficulty, vision change, etc. Blood pressure 128/66, pulse 85, temperature 98.1 F (36.7 C), temperature source Oral, resp. rate 20, weight 91.6 kg, SpO2 98 %. Estimated body mass index is 24.58 kg/m as calculated from the following:   Height as  of 06/11/19: 6\' 4"  (1.93 m).   Weight as of this encounter: 91.6 kg.  Physical Exam  General: An alert and pleasant 58 year old black male in no apparent distress.  HEENT: Normocephalic, atraumatic, extraocular muscles intact  Neck: Supple without masses or deformities.  He has a mildly limited cervical range of motion.  Spurling's testing is negative.  Lhermitte sign was not present.  Thorax: Symmetric he has a right Vas-Cath in place.  Abdomen: Soft  Extremities: Unremarkable  Back exam: Unremarkable  Neurologic exam: The patient is alert and oriented x3.  Cranial nerves II through XII were examined bilaterally grossly normal.  Vision and hearing are grossly normal bilaterally.  The patient's motor strength is 5/5 in his bile deltoid, bicep, tricep, handgrip.  He gives away bilateral in his lower extremities.  He seems to have at least 4/5 strength in his bilateral iliopsoas, quadricep, gastrocnemius, and dorsiflexors when I can get him to give a full effort.  Cerebellar functions intact to rapid alternating movements of the upper extremities bilaterally.  He has diffusely decreased sensation in his lower extremities.  I do not note a sensory level.  The patient's deep tendon reflexes are 2+ to 3/4 in his bile quadricep, absent his gastrocnemius.  There is no ankle clonus.  Imaging studies I have reviewed the patient's cervical MRI performed at [facility] yesterday.  He has a straightened cervical spine.  He has spondylosis and spinal stenosis most prominent at C4-5 and C5-6.  I do not see any acute changes.  I have also reviewed the patient's thoracic MRI performed at [facility] yesterday.  It is unremarkable.  I have also reviewed the patient's lumbar MRI performed at [facility] yesterday.  It demonstrates some degenerative changes.  The radiologist questioned some soft tissue changes possible discitis at L4-5 and L5-S1.  There is no significant neural  compression.  Assessment/Plan: Vague weakness: I have discussed the situation with Dr. Ree Kida.  His thoracic MRI appears unremarkable to me.  However it is possible he has some thoracic ischemia given his history of a recent thoracic aorta stenting.  I can not help him with this via surgery.  I would  suggest PT, OT and rehab.  Cervical spondylosis and stenosis: Perhaps he injured his cervical spinal cord during the fall at home after discharge, however he is not having cervical symptoms, upper extremity weakness, etc.  At some point, if he makes a good recovery, he may be a candidate for an anterior cervical discectomy, fusion and plating.  This does not need to be done acutely.  Please have him follow-up with me in the office in a month or 2.  Lumbar discitis versus degenerative changes: I would suggest an ID consult as to whether they think he needs to be treated with prolonged course of IV antibiotics versus observation.  He does not need surgery on his lumbar spine.  Multiple medical problems: Perhaps his vague weakness is multifactorial.  I would suggest an ID consult, physical therapy, occupational therapy and rehab consults.  Please have him follow-up with me in the office.  Ophelia Charter 06/16/2019, 10:43 AM

## 2019-06-17 ENCOUNTER — Other Ambulatory Visit: Payer: Self-pay

## 2019-06-17 ENCOUNTER — Encounter (HOSPITAL_COMMUNITY): Payer: Self-pay | Admitting: *Deleted

## 2019-06-17 ENCOUNTER — Inpatient Hospital Stay (HOSPITAL_COMMUNITY): Payer: Medicare Other

## 2019-06-17 DIAGNOSIS — R079 Chest pain, unspecified: Secondary | ICD-10-CM

## 2019-06-17 DIAGNOSIS — R05 Cough: Secondary | ICD-10-CM

## 2019-06-17 DIAGNOSIS — R531 Weakness: Secondary | ICD-10-CM

## 2019-06-17 HISTORY — PX: IR LUMBAR DISC ASPIRATION W/IMG GUIDE: IMG5306

## 2019-06-17 LAB — CBC
HCT: 24.5 % — ABNORMAL LOW (ref 39.0–52.0)
Hemoglobin: 8.1 g/dL — ABNORMAL LOW (ref 13.0–17.0)
MCH: 31.3 pg (ref 26.0–34.0)
MCHC: 33.1 g/dL (ref 30.0–36.0)
MCV: 94.6 fL (ref 80.0–100.0)
Platelets: 437 10*3/uL — ABNORMAL HIGH (ref 150–400)
RBC: 2.59 MIL/uL — ABNORMAL LOW (ref 4.22–5.81)
RDW: 17 % — ABNORMAL HIGH (ref 11.5–15.5)
WBC: 19.6 10*3/uL — ABNORMAL HIGH (ref 4.0–10.5)
nRBC: 0 % (ref 0.0–0.2)

## 2019-06-17 LAB — PROTIME-INR
INR: 1.3 — ABNORMAL HIGH (ref 0.8–1.2)
Prothrombin Time: 15.8 seconds — ABNORMAL HIGH (ref 11.4–15.2)

## 2019-06-17 LAB — BASIC METABOLIC PANEL
Anion gap: 15 (ref 5–15)
BUN: 77 mg/dL — ABNORMAL HIGH (ref 6–20)
CO2: 23 mmol/L (ref 22–32)
Calcium: 8.6 mg/dL — ABNORMAL LOW (ref 8.9–10.3)
Chloride: 90 mmol/L — ABNORMAL LOW (ref 98–111)
Creatinine, Ser: 12.29 mg/dL — ABNORMAL HIGH (ref 0.61–1.24)
GFR calc Af Amer: 5 mL/min — ABNORMAL LOW (ref >60)
GFR calc non Af Amer: 4 mL/min — ABNORMAL LOW (ref >60)
Glucose, Bld: 109 mg/dL — ABNORMAL HIGH (ref 70–99)
Potassium: 4.6 mmol/L (ref 3.5–5.1)
Sodium: 128 mmol/L — ABNORMAL LOW (ref 135–145)

## 2019-06-17 MED ORDER — FENTANYL CITRATE (PF) 100 MCG/2ML IJ SOLN
INTRAMUSCULAR | Status: AC | PRN
  Administered 2019-06-17 (×2): 50 ug via INTRAVENOUS
  Administered 2019-06-17 (×2): 25 ug via INTRAVENOUS

## 2019-06-17 MED ORDER — HYDRALAZINE HCL 20 MG/ML IJ SOLN
10.0000 mg | Freq: Four times a day (QID) | INTRAMUSCULAR | Status: DC | PRN
  Administered 2019-06-26: 10 mg via INTRAVENOUS
  Filled 2019-06-17: qty 1

## 2019-06-17 MED ORDER — LIDOCAINE HCL 1 % IJ SOLN
INTRAMUSCULAR | Status: AC | PRN
  Administered 2019-06-17: 20 mL

## 2019-06-17 MED ORDER — LIDOCAINE HCL (PF) 1 % IJ SOLN
INTRAMUSCULAR | Status: AC
  Filled 2019-06-17: qty 30

## 2019-06-17 MED ORDER — HEPARIN SODIUM (PORCINE) 1000 UNIT/ML IJ SOLN
INTRAMUSCULAR | Status: AC
  Administered 2019-06-17: 10:00:00
  Filled 2019-06-17: qty 4

## 2019-06-17 MED ORDER — FENTANYL CITRATE (PF) 100 MCG/2ML IJ SOLN
INTRAMUSCULAR | Status: AC
  Filled 2019-06-17: qty 2

## 2019-06-17 MED ORDER — MIDAZOLAM HCL 2 MG/2ML IJ SOLN
INTRAMUSCULAR | Status: AC
  Filled 2019-06-17: qty 2

## 2019-06-17 MED ORDER — ASPIRIN EC 81 MG PO TBEC
81.0000 mg | DELAYED_RELEASE_TABLET | Freq: Every day | ORAL | Status: DC
  Administered 2019-06-17 – 2019-06-21 (×5): 81 mg via ORAL
  Filled 2019-06-17 (×5): qty 1

## 2019-06-17 MED ORDER — MIDAZOLAM HCL 2 MG/2ML IJ SOLN
INTRAMUSCULAR | Status: AC | PRN
  Administered 2019-06-17: 0.5 mg via INTRAVENOUS
  Administered 2019-06-17: 1 mg via INTRAVENOUS
  Administered 2019-06-17: 0.5 mg via INTRAVENOUS

## 2019-06-17 NOTE — Plan of Care (Signed)
Came to see pt twice today, however pt was in HD earlier and now in IR for disc aspiration. Will come by tomorrow. VVS Dr. Oneida Alar cleared him for ASA and will put on ASA 81mg  daily.   Rosalin Hawking, MD PhD Stroke Neurology 06/17/2019 2:12 PM

## 2019-06-17 NOTE — Sedation Documentation (Signed)
Pateint questioned about hypertension. Patient states he has hypertension and blood preesure usually runs around [id number] systolic and over [id number] diastolic.

## 2019-06-17 NOTE — Progress Notes (Signed)
tx stopped with 1.5hrs left d/t chest and pt not stable; EKG ordered by Junius Finner, PA and attending made aware. Reported to Dillon Bjork, RN on the floor.

## 2019-06-17 NOTE — Progress Notes (Signed)
PT Cancellation Note  Patient Details Name: Frank Fuller MRN: [id number] DOB: Dec 26, 1960   Cancelled Treatment:      Currently in HD;  Will follow up later today as time allows;  Otherwise, will follow up for PT tomorrow;   Thank you,  [name], PT  Acute Rehabilitation Services Pager [phone] Office [place] 06/17/2019, 8:14 AM

## 2019-06-17 NOTE — Procedures (Signed)
I was present at this dialysis session. I have reviewed the session itself and made appropriate changes.   Notes by VVS, [facility], Neuro reviewed. For aspiration at site of L4-L5 diskitis with IR. Vanc/Cefepiem  2K, 1-2L UF. WIll keep SBP > 110 with HD.  TDC Qb 400.    Filed Weights   06/14/19 2014 06/17/19 0451 06/17/19 0650  Weight: 91.6 kg 93.3 kg 94.5 kg    Recent Labs  Lab 06/16/19 0617 06/17/19 0534  NA 130* 128*  K 4.2 4.6  CL 91* 90*  CO2 24 23  GLUCOSE 116* 109*  BUN 59* 77*  CREATININE 10.50* 12.29*  CALCIUM 8.7* 8.6*  PHOS 5.0*  --     Recent Labs  Lab 06/15/19 0619 06/16/19 0617 06/17/19 0700  WBC 26.1* 27.2* 19.6*  HGB 8.9* 8.3* 8.1*  HCT 27.2* 24.8* 24.5*  MCV 95.4 93.2 94.6  PLT 364 375 437*    Scheduled Meds: . amLODipine  5 mg Oral QHS  . atorvastatin  20 mg Oral q1800  . darbepoetin (ARANESP) injection - DIALYSIS  100 mcg Intravenous Q Sat-HD  . ferric citrate  630 mg Oral TID WC  . hydrALAZINE  100 mg Oral TID  . labetalol  300 mg Oral BID  . losartan  50 mg Oral BID  . minoxidil  2.5 mg Oral BID  . sodium chloride flush  3 mL Intravenous Q12H   Continuous Infusions: . ceFEPime (MAXIPIME) IV 2 g (06/14/19 1810)  . vancomycin 1,000 mg (06/14/19 0920)   PRN Meds:.acetaminophen **OR** acetaminophen, albuterol, fentaNYL (SUBLIMAZE) injection, ferric citrate, ondansetron **OR** ondansetron (ZOFRAN) IV, oxyCODONE   [name]  MD 06/17/2019, 8:40 AM

## 2019-06-17 NOTE — Procedures (Signed)
  Procedure: FLuoro guided L4-5 disc aspiration 17g EBL:   minimal Complications:  none immediate  See full dictation in [employer].  Dillard Cannon MD Main # [phone] Pager  [phone]

## 2019-06-17 NOTE — Progress Notes (Addendum)
Nephrology Quick Note:  Called over to assess patient 2.5hr into HD. BP was ok, but HR slightly irregular and telemetry lead with ST depression. Less than 1L removed at that time. He c/o feeling bad all over and pain in chest, looked sick with tears streaming down his face. UF turned off and 256mL fluid bolus given without improvement in symptoms. Ended up taking him off HD completed. EKG ordered - reviewed by me. No ST changes, 1st degree block v. ?A-fib - hard to decipher p-waves. He did begin to feel better after coming off HD. Secure text sent to his hospitalist to eval him when he gets back to his room.  Veneta Penton, PA-C [employer] Pager [phone]

## 2019-06-17 NOTE — Progress Notes (Addendum)
STROKE TEAM PROGRESS NOTE   INTERVAL HISTORY Pt lying in bed, stated that he felt better, pain improved as well as his leg movement. he had disk aspiration yesterday and HD too.    Vitals:   06/17/19 2041 06/18/19 0514 06/18/19 0927 06/18/19 0927  BP: (!) 151/70 (!) 151/67  (!) 172/77  Pulse: (!) 48 67  62  Resp: 16 18  18   Temp: 98.8 F (37.1 C) 99.2 F (37.3 C) 98.2 F (36.8 C)   TempSrc: Oral Oral    SpO2: 94% 99%  100%  Weight:  93.2 kg    Height:        CBC:  Recent Labs  Lab 06/17/19 0700 06/18/19 0557  WBC 19.6* 13.3*  HGB 8.1* 8.0*  HCT 24.5* 24.5*  MCV 94.6 95.0  PLT 437* 441*    Basic Metabolic Panel:  Recent Labs  Lab 06/15/19 0619 06/16/19 0617 06/17/19 0534 06/18/19 0557  NA 132* 130* 128* 133*  K 4.5 4.2 4.6 4.6  CL 92* 91* 90* 95*  CO2 24 24 23 25   GLUCOSE 108* 116* 109* 110*  BUN 38* 59* 77* 55*  CREATININE 7.81* 10.50* 12.29* 9.65*  CALCIUM 9.1 8.7* 8.6* 8.8*  PHOS 5.2* 5.0*  --   --    Lipid Panel:     Component Value Date/Time   CHOL 128 06/16/2019 0617   TRIG 378 (H) 06/16/2019 0617   HDL <10 (L) 06/16/2019 0617   CHOLHDL NOT CALCULATED 06/16/2019 0617   VLDL 76 (H) 06/16/2019 0617   LDLCALC NOT CALCULATED 06/16/2019 0617   HgbA1c:  Lab Results  Component Value Date   HGBA1C 5.0 06/16/2019   Urine Drug Screen:     Component Value Date/Time   LABOPIA NONE DETECTED 12/11/2016 0545   COCAINSCRNUR NONE DETECTED 12/11/2016 0545   LABBENZ NONE DETECTED 12/11/2016 0545   AMPHETMU NONE DETECTED 12/11/2016 0545   THCU POSITIVE (A) 12/11/2016 0545   LABBARB NONE DETECTED 12/11/2016 0545    Alcohol Level No results found for: [facility]  IMAGING Dg Chest 1 View  Result Date: 06/18/2019 CLINICAL DATA:  Shortness of breath, cough. EXAM: CHEST  1 VIEW COMPARISON:  Radiograph of June 15, 2019. FINDINGS: Stable cardiomediastinal silhouette. Status post stent graft repair of descending thoracic aorta. No pneumothorax or pleural effusion is  noted. Right internal jugular catheter is unchanged in position. Minimal right basilar subsegmental atelectasis is noted. Increased left basilar opacity is noted concerning for worsening atelectasis. Bony thorax is unremarkable. IMPRESSION: Increased left basilar opacity is noted concerning for worsening atelectasis. Minimal right basilar subsegmental atelectasis is noted. Electronically Signed   By: Marijo Conception M.D.   On: 06/18/2019 10:56   Dg Abd 1 View  Result Date: 06/18/2019 CLINICAL DATA:  Abdominal pain. EXAM: ABDOMEN - 1 VIEW COMPARISON:  Radiographs of June 12, 2019. FINDINGS: Stable large amount of residual contrast and stool is seen throughout the colon. No significant small bowel dilatation is noted. No abnormal calcifications are noted. IMPRESSION: Stable large amount of residual contrast and stool is seen throughout the colon. No abnormal bowel dilatation is noted. Electronically Signed   By: Marijo Conception M.D.   On: 06/18/2019 10:59   Ir Lumbar Disc Aspiration W/img Guide  Result Date: 06/17/2019 INDICATION: Low back pain.  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 No drainable fluid collection/abscess is identified. EXAM: LUMBAR L4-5 DISC ASPIRATION UNDER FLUOROSCOPY MEDICATIONS: Lidocaine 1% subcutaneous ANESTHESIA/SEDATION: Intravenous Fentanyl 17mcg  and Versed 2mg  were administered as conscious sedation during continuous monitoring of the patient's level of consciousness and physiological / cardiorespiratory status by the radiology RN, with a total moderate sedation time of 15 minutes. PROCEDURE: Informed written consent was obtained from the patient after a thorough discussion of the procedural risks, benefits and alternatives. All questions were addressed. Maximal Sterile Barrier Technique was utilized including caps, mask, sterile gowns, sterile gloves, sterile drape, hand hygiene and skin antiseptic. A timeout was performed prior to the  initiation of the procedure. The L4-5 interspace was identified under fluoroscopy, corresponding to previous cross-sectional imaging. An appropriate skin entry site was determined. After local infiltration with 1% lidocaine, a 17 gauge trocar needle was advanced into the interspace from left posterolateral extraforaminal approach. Needle tip position within the interspace confirmed on biplane images. Less than 1 mL of very thick opaque viscous debris were aspirated, sent for the requested laboratory studies. The patient tolerated the procedure well. FLUOROSCOPY TIME:  1.7 minutes; [id number] DAP COMPLICATIONS: None immediate. IMPRESSION: Technically successful lumbar L4-5 disc aspiration under fluoroscopy. Electronically Signed   By: Lucrezia Europe M.D.   On: 06/17/2019 16:42    PHYSICAL EXAM    Temp:  [98.2 F (36.8 C)-99.2 F (37.3 C)] 98.2 F (36.8 C) (08/26 0927) Pulse Rate:  [48-71] 62 (08/26 0927) Resp:  [16-27] 18 (08/26 0927) BP: (150-176)/(67-140) 172/77 (08/26 0927) SpO2:  [94 %-100 %] 100 % (08/26 0927) Weight:  [93.2 kg] 93.2 kg (08/26 0514)  General - Well nourished, well developed, in no apparent distress.  Ophthalmologic - fundi not visualized due to noncooperation.  Cardiovascular - Regular rate and rhythm.  Mental Status -  Level of arousal and orientation to time, place, and person were intact. Language including expression, naming, repetition, comprehension was assessed and found intact.  Cranial Nerves II - XII - II - Visual field intact OU. III, IV, VI - Extraocular movements intact. V - Facial sensation intact bilaterally. VII - Facial movement intact bilaterally. VIII - Hearing & vestibular intact bilaterally. X - Palate elevates symmetrically. XI - Chin turning & shoulder shrug intact bilaterally. XII - Tongue protrusion intact.  Motor Strength - The patient's strength was normal in upper extremities and pronator drift was absent. However, b/l LEs psoas 3-/5,  knee extension 3/5 and DF and PF 4/5, mild leg pain with LE movement.  Bulk was normal and fasciculations were absent.   Motor Tone - Muscle tone was assessed at the neck and appendages and was normal.  Reflexes - The patient's reflexes were 1+ in LEs and he had no pathological reflexes.  Sensory - Light touch, pinprick were assessed and were symmetrical, no significant sensory level. Priopreception seems decreased bilaterally but difficulty to exam due to pain with joint movement.     Coordination - The patient had normal movements in the hands with no ataxia or dysmetria.  However, still has b/l UE asterixis.  Gait and Station - deferred.   ASSESSMENT/PLAN [name] is a 58 y.o. male with history of hypertension, end-stage renal disease on hemodialysis, HCV, depression, CVA, recent AAA dissection status post stent on 8/14 discharged on 8/20 unable to walk since d/c presenting with abd pain, back pain, fever, unable to move lets. Admitted for sepsis d/t PNA, ileus, proctitis. 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 Severe multilevel cervical neural foraminal stenosis and mild spinal stenosis C6-7. 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 Therapy recommended.  Cervical:  No cervical sx now. F/u as OP. Consider discectomy at that time. Lumbar: recommend ID consult for ? Abx. No surgical intervention.   vascular surgery consult ([facility]) no surgical intervention. Continue therapies. Goal SBP 120-170. Ok to treat with asa.   LDL 101  HgbA1c  5.0  SCDs for VTE prophylaxis  No antithrombotic prior to admission, now on aspirin 81 mg daily . Continue on discharge.   Therapy recommendations:  CIR  Disposition:  pending   Aortic dissection s/p repair   Pt had admission for aortic dissection repair 8/14-8/20  Discharged in good condition without leg weakness  However, 2h after discharge, readmitted with abdominal pain, back pain and leg weakness  Also has tremendous pain currently with abdomen, back and b/l legs with movement  vascular surgery consult ([facility]) no surgical intervention. Continue therapies. Goal SBP 120-170. Ok to treat with asa.   Suspected sepsis - secondary to PNA / discitis / osteomyelitis   Tmax 101.1 this admission  Currently afebrile   possibly secondary to pneumonia/proctititis/ileus, questionable cellulitis  Blood culture in [place] negative  Repeat Blood culture pending  Leukocytosis WBC 17.6->21.2->26.1->27.2->19.6->13.3  On cefepime and vanco  Does not feel to have endocarditis  ID on board, continue Abx  S/p aspiration of discitis by IR 06/17/19  Hx stroke/TIA  11/2016 - Stroke: small L frontal ischemic infarct in setting of HTN, infarct secondary to small vessel disease. MRA h/n negative. EF 50-55%, LDL 69 and A1C 5.2. No asa d/t thrombocytopenia at that time but now resolved.  HTN  Stable  Goal 120-140 due to aortic dissection  Now on novasc, hydralazine, labetalol, losartan and minoxidil  BP monitoring   Hyperlipidemia  Home meds:  No statin  LDL 101, goal < 70  On lipitor 20  Continue statin at discharge  Tobacco abuse  Current smoker  Smoking cessation counseling provided  Pt is willing to quit  Other Stroke Risk Factors  Hx Substance abuse   Other Active Problems  ESRD on HD Cre 9.65  Anemia of chronic disease 8.0  LUE swelling, neg DVT  Abd pain d/t acute ileus and proctitis  Cervical myelomalacia - chronic on MRI C-spine - outpt follow  up with NSY in 2 mos or so  Hospital day # 5  Neurology will sign off. Please call with questions. Pt will follow up with stroke clinic [name] at [facility] in about 4 weeks. Thanks for the consult. Discussed with Dr. Tawanna Solo.   Rosalin Hawking, MD PhD Stroke Neurology 06/18/2019 2:32 PM  To contact Stroke Continuity provider, please refer to [email or web address] After hours, contact General Neurology

## 2019-06-17 NOTE — Progress Notes (Signed)
PROGRESS NOTE    Frank Fuller  [id number] DOB: 10-13-1961 DOA: 06/13/2019 PCP: [facility]   Brief Narrative:  HPI on 06/13/2019 by Dr. Fuller Plan Frank Fuller is a 58 y.o. male with medical history significant of hypertension, ESRD on HD(T/Th/Sat) AAA s/p stent , HCV s/p treatment, depression, and CVA; who initially presented to [facility] with multiple complaints.  Complains of having aching pain of his abdomen, back, and chest.  Associated symptoms included constipation with last bowel movement 8 days ago, fever, nonproductive cough, and chills.  Patient just recently been hospitalized from 8/11-8/20 for dissection of the thoracic aorta treated with endovascular stentgraft with spinal drain by Dr. Oneida Alar on 8/14.  He had just been discharged home and patient immediately came back to the hospital.  He complained of having the urge to use restroom but being unable to do so.  His last hemodialysis session was on 8/20 prior to his discharge.  Upon arrival to [facility] patient was noted to be febrile up to 102.4 F, blood pressure 192/96, heart rates 107, and O2 saturations maintained on room air.  Labs significant for W BC 14.8-> 17.9, hemoglobin 10.2, sodium 132, potassium 3.6, BUN 32, creatinine 5.8, CRP 172.2, procalcitonin 3.04, and pro BNP 54500.  COVID-19 screening negative.  CT angiogram of the chest abdomen and pelvis was obtained.  It showed interval repair of the thoracic aortic dissection, slight increase in left pleural effusion, improved right lung nodular airspace disease likely infectious, generalized ileus with slow bowel transit, and large colonic stool burden with mild rectal wall thickening.  Patient received vancomycin, cefepime, morphine, fentanyl, and magnesium citrate.  Transfer was requested here at [facility].  Interim history Admitted with sepsis. Poor historian. Per nephrology, patient having problems with his lower extremities.  CT as well as MRI  ordered.  Neurology, neurosurgery consulted.  Imaging showed spinal infarct with questionable discitis/osteomyelitis.  IR consulted for disc aspiration.  ID also consulted.  Neurosurgery also consulted as patient recently had dissection.  Assessment & Plan   Sepsis possibly secondary to pneumonia/proctititis, questionable cellulitis, ?osteo myelitis/discitis -Present on admission, patient presented with fever of 102.4 F, leukocytosis at outside facility -CRP and pro calcitonin were both elevated -CT noted right-sided pneumonia with ileus and proctitis -Nephrology noted left arm cellulitis -Cultures from outside facility [facility]) show no growth -Blood cultures from 8/23 show no growth to date -Patient placed on broad-spectrum antibiotics of vancomycin and cefepime -Patient has been afebrile for the past 24 hours -Leukocytosis improving, currently down to 19.6 -MRI lumbar spine showed new space-occupying material in the lower [place] peritoneum and lumbosacral junction.  Favor acute discitis and osteomyelitis at L4-L5 with pervertebral phlegmon.  -Neurosurgery as well as interventional radiology consulted appreciated.  Planning for aspiration. -Infectious disease also consulted and appreciated.  Hypoxia -Patient was noted to have hypoxia, oxygen saturations of 87% on room air -Continue supplemental oxygen to maintain saturations above 90% -CT did show left pleural effusion, proBNP was elevated -Continue volume control with dialysis -COVID at [facility] negative  Acute Illeus and proctitis -Noted on CT imaging at outside facility showing signs of ileus with large stool burden and inflammation of the rectum  -Patient reported feeling nauseous but denied any current vomiting -Placed on renal diet  Anemia of chronic disease -Hemoglobin currently 8.1 -FOBT negative -Continue iron supplementation -Continue to monitor  Left upper extremity swelling -Patient was evaluated with Doppler  ultrasound of the left upper extremity during his last hospitalization and noted  to be negative for any signs of a DVT. -Continue to monitor  Thoracic aortic dissection -Recent admission and discharge by vascular surgery with endovascular stent placement -Neurosurgery consulted and appreciated  ESRD -On hemodialysis on Tuesday, Thursday, Saturday -Nephrology consulted and appreciated  Essential hypertension -Stable, continue labetalol, hydralazine, amlodipine, losartan, and minoxidil- wit holding parameters  Lower extremity weakness- Spinal infarct -CT head: Left caudate head lacunar infarct new since prior study but appears remote.  No acute intercranial normality. 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  Abnormal spinal cord at T9-T10, T10-11, possible segmental cord infarct. -? Could this be related to recent aortic dissection and graft -Neurosurgery consulted and appreciated, no intervention -PT and OT consulted  ?Acute Discitis Osteomyelitis -MRI lumbar spine shows L4-5 possible acute discitis -Discussed with neurosurgery, possibly degenerative disc. Would obtain blood cultures. Possible sed rate, ID consult. -As above blood cultures show no growth to date -Patient currently on broad-spectrum antibiotics of vancomycin and cefepime -Treatment plan as stated above, IR consulted for aspiration.  Infectious disease also consulted.  Chest pain -Received message from nephrology the patient was having chest pain while on dialysis today.  Upon my examination of patient, there were no complaints of chest pain. -EKG obtained unchanged from previous.  First-degree AV block. -Continue to monitor  DVT Prophylaxis  SCDs  Code Status: Full  Family Communication: None at bedside  Disposition Plan: Admitted.  Dispo TBD  Consultants Nephrology Neurology Neurosurgery Infectious disease Vascular surgery Interventional radiology  Procedures  None  Antibiotics   Anti-infectives (From admission, onward)   Start     Dose/Rate Route Frequency Ordered Stop   06/14/19 1800  ceFEPIme (MAXIPIME) 2 g in sodium chloride 0.9 % 100 mL IVPB     2 g 200 mL/hr over 30 Minutes Intravenous Every T-Th-Sa (1800) 06/13/19 1540     06/14/19 1200  vancomycin (VANCOCIN) IVPB 1000 mg/200 mL premix     1,000 mg 200 mL/hr over 60 Minutes Intravenous Every T-Th-Sa (Hemodialysis) 06/13/19 1540     06/13/19 1545  vancomycin (VANCOCIN) 2,000 mg in sodium chloride 0.9 % 500 mL IVPB     2,000 mg 250 mL/hr over 120 Minutes Intravenous  Once 06/13/19 1519 06/13/19 1829   06/13/19 1545  ceFEPIme (MAXIPIME) 2 g in sodium chloride 0.9 % 100 mL IVPB     2 g 200 mL/hr over 30 Minutes Intravenous  Once 06/13/19 1519 06/13/19 2241      Subjective:   Frank Fuller seen and examined today.  Poor historian.  Has no complaints today in hemodialysis.  Able to move his legs and states that they now feel numb.  Denies current chest pain, shortness breath, abdominal pain, nausea vomiting, diarrhea constipation, dizziness or headache. Objective:   Vitals:   06/17/19 0830 06/17/19 0900 06/17/19 0929 06/17/19 0946  BP: 107/70 (!) 158/79 (!) 182/71 (!) 172/93  Pulse: (!) 125 (!) 109 72 72  Resp: 18 17 18 12   Temp:   98.6 F (37 C)   TempSrc:   Oral Oral  SpO2: 96% 94% 94%   Weight:      Height:        Intake/Output Summary (Last 24 hours) at 06/17/2019 0957 Last data filed at 06/17/2019 0929 Gross per 24 hour  Intake 840 ml  Output 481 ml  Net 359 ml   Filed Weights   06/14/19 2014 06/17/19  0451 06/17/19 0650  Weight: 91.6 kg 93.3 kg 94.5 kg   Exam  General: Well developed, likely ill-appearing, NAD  HEENT: NCAT,  mucous membranes moist.   Neck: Supple,  Cardiovascular: S1 S2 auscultated, RRR, no  murmur  Respiratory: Clear to auscultation bilaterally with equal chest rise  Abdomen: Soft, nontender, nondistended, + bowel sounds  Extremities: warm dry without cyanosis clubbing or edema  Neuro: AAOx3, nonfocal, moving all extremities   Psych: Pleasant, appropriate mood and affect  Data Reviewed: I have personally reviewed following labs and imaging studies  CBC: Recent Labs  Lab 06/13/19 1601 06/14/19 0449 06/15/19 0619 06/16/19 0617 06/17/19 0700  WBC 17.6* 21.2* 26.1* 27.2* 19.6*  HGB 9.2* 8.0* 8.9* 8.3* 8.1*  HCT 27.8* 24.8* 27.2* 24.8* 24.5*  MCV 94.2 96.5 95.4 93.2 94.6  PLT 324 321 364 375 [id number]*   Basic Metabolic Panel: Recent Labs  Lab 06/12/19 0312 06/14/19 0449 06/15/19 0619 06/16/19 0617 06/17/19 0534  NA 132* 130* 132* 130* 128*  K 4.1 4.5 4.5 4.2 4.6  CL 94* 91* 92* 91* 90*  CO2 24 22 24 24 23   GLUCOSE 105* 85 108* 116* 109*  BUN 55* 50* 38* 59* 77*  CREATININE 9.29* 9.69* 7.81* 10.50* 12.29*  CALCIUM 9.1 8.6* 9.1 8.7* 8.6*  PHOS  --   --  5.2* 5.0*  --    GFR: Estimated Creatinine Clearance: 7.8 mL/min (A) (by C-G formula based on SCr of 12.29 mg/dL (H)). Liver Function Tests: Recent Labs  Lab 06/15/19 0619 06/16/19 0617  ALBUMIN 2.0* 1.9*   No results for input(s): LIPASE, AMYLASE in the last 168 hours. No results for input(s): AMMONIA in the last 168 hours. Coagulation Profile: Recent Labs  Lab 06/17/19 0534  INR 1.3*   Cardiac Enzymes: No results for input(s): CKTOTAL, CKMB, CKMBINDEX, TROPONINI in the last 168 hours. BNP (last 3 results) No results for input(s): PROBNP in the last 8760 hours. HbA1C: Recent Labs    06/16/19 0617  HGBA1C 5.0   CBG: Recent Labs  Lab 06/15/19 1708 06/15/19 2119 06/15/19 2358 06/16/19 0353 06/16/19 0733  GLUCAP 109* 127* 117* 129* 116*   Lipid Profile: Recent Labs    06/16/19 0617  CHOL 128  HDL <10*  LDLCALC NOT CALCULATED  TRIG 378*  CHOLHDL NOT CALCULATED   Thyroid  Function Tests: No results for input(s): TSH, T4TOTAL, FREET4, T3FREE, THYROIDAB in the last 72 hours. Anemia Panel: No results for input(s): VITAMINB12, FOLATE, FERRITIN, TIBC, IRON, RETICCTPCT in the last 72 hours. Urine analysis:    Component Value Date/Time   COLORURINE YELLOW 08/04/2017 1824   APPEARANCEUR HAZY (A) 08/04/2017 1824   LABSPEC 1.009 08/04/2017 1824   PHURINE 9.0 (H) 08/04/2017 1824   GLUCOSEU 150 (A) 08/04/2017 1824   HGBUR NEGATIVE 08/04/2017 1824   BILIRUBINUR NEGATIVE 08/04/2017 1824   KETONESUR NEGATIVE 08/04/2017 1824   PROTEINUR >=300 (A) 08/04/2017 1824   NITRITE NEGATIVE 08/04/2017 1824   LEUKOCYTESUR NEGATIVE 08/04/2017 1824   Sepsis Labs: @LABRCNTIP (procalcitonin:4,lacticidven:4)  ) Recent Results (from the past 240 hour(s))  Culture, blood (routine x 2)     Status: None (Preliminary result)   Collection Time: 06/15/19 11:20 AM   Specimen: BLOOD LEFT HAND  Result Value Ref Range Status   Specimen Description BLOOD LEFT HAND  Final   Special Requests   Final    BOTTLES DRAWN AEROBIC ONLY Blood Culture results may not be optimal due to an inadequate volume of blood received in culture bottles  Culture   Final    NO GROWTH 1 DAY Performed at [facility], [place] [address], [place], [place] [address]    Report Status PENDING  Incomplete  Culture, blood (routine x 2)     Status: None (Preliminary result)   Collection Time: 06/15/19 11:28 AM   Specimen: BLOOD  Result Value Ref Range Status   Specimen Description BLOOD LEFT ANTECUBITAL  Final   Special Requests   Final    BOTTLES DRAWN AEROBIC ONLY Blood Culture results may not be optimal due to an inadequate volume of blood received in culture bottles   Culture   Final    NO GROWTH 1 DAY Performed at [facility], [place] [address] [place], [place] [address]    Report Status PENDING  Incomplete      Radiology Studies: Ct Head Wo Contrast  Result Date: 06/15/2019 CLINICAL DATA:   Fatigue and malaise. Hypertension. End-stage renal disease. EXAM: CT HEAD WITHOUT CONTRAST TECHNIQUE: Contiguous axial images were obtained from the base of the skull through the vertex without intravenous contrast. COMPARISON:  MRI of the head 12/13/2016 FINDINGS: Brain: A remote lacunar infarct of the left caudate head is new, but not acute. Extensive periventricular white matter changes are otherwise stable. Confluent subcortical white matter hypoattenuation is also stable, right greater than left. No acute cortical infarct, hemorrhage, or mass lesion is present. The ventricles are proportionate to the degree of atrophy. No significant extraaxial fluid collection is present. The brainstem and cerebellum are within normal limits. Vascular: Atherosclerotic changes are noted within the cavernous internal carotid arteries bilaterally. There is no hyperdense vessel. Skull: Calvarium is intact. No focal lytic or blastic lesions are present. No significant extracranial soft tissue lesion is present. Sinuses/Orbits: The paranasal sinuses and mastoid air cells are clear. The globes and orbits are within normal limits. IMPRESSION: 1. Left caudate head lacunar infarct is new since the prior study but appears remote. 2. Otherwise stable extensive periventricular and subcortical white matter changes bilaterally consistent chronic microvascular ischemia. 3. No acute intracranial abnormality. Electronically Signed   By: San Morelle M.D.   On: 06/15/2019 11:47   Mr Cervical Spine Wo Contrast  Result Date: 06/15/2019 CLINICAL DATA:  58 year old male with acute myelopathy. Known aortic dissection, recently treated with stenting. EXAM: MRI CERVICAL AND THORACIC SPINE WITHOUT CONTRAST TECHNIQUE: Multiplanar and multiecho pulse sequences of the cervical spine, to include the craniocervical junction and cervicothoracic junction, and the thoracic spine, were obtained without intravenous contrast. COMPARISON:  CTA CT Chest,  Abdomen, and Pelvis 06/13/2019 and earlier. No prior cervical spine imaging, but brain MRI 12/13/2016. FINDINGS: MRI CERVICAL SPINE FINDINGS Alignment: Straightening of cervical lordosis. No spondylolisthesis. Vertebrae: Degenerative appearing endplate marrow signal changes at C5-C6 with faint marrow edema. No other marrow edema or evidence of acute osseous abnormality. Background bone marrow signal is within normal limits. Cord: Mass effect on the cervical spinal cord at C4-C5 and C5-C6. Is difficult to exclude abnormal cord signal at both levels such as due to myelomalacia due to compressive myelopathy (series 8, image 24 at C5-C6). And there is evidence of abnormal signal on series 8, image 24 greater on the left. Above and below those levels spinal cord signal and morphology is within normal limits. Posterior Fossa, vertebral arteries, paraspinal tissues: Cervicomedullary junction is within normal limits. Negative visible posterior fossa. Preserved major vascular flow voids in the neck. The left vertebral artery appears dominant. Negative neck soft tissues. Disc levels: Degenerative changes notable  for: C3-C4: Right eccentric circumferential disc osteophyte complex with mild to moderate right C4 foraminal stenosis. C4-C5: Circumferential disc osteophyte complex with broad-based posterior component. Spinal stenosis with mild to moderate spinal cord mass effect (series 8, image 20). Severe left and moderate to severe right C5 foraminal stenosis. C5-C6: Disc space loss with circumferential disc osteophyte complex. Broad-based posterior component. Mild spinal stenosis and mild to moderate cord mass effect. Severe bilateral C6 foraminal stenosis. C6-C7: Circumferential disc osteophyte complex mostly affecting the neural foramina. Borderline to mild spinal stenosis without cord mass effect. Severe bilateral C7 foraminal stenosis. C7-T1: Mild to moderate facet hypertrophy greater on the left. Mild to moderate left C8  foraminal stenosis. MRI THORACIC SPINE FINDINGS Thoracic spine segmentation:  Normal on the comparison CT. Alignment:  Preserved thoracic kyphosis.  No spondylolisthesis. Vertebrae: Mild endplate irregularity including superiorly at T8 appears to be stable and chronic. STIR hyperintensity at the inferior T6 endplate appears to be related to a Schmorl's node (series 14, image 10). No acute osseous abnormality identified. Cord: Above T10 the thoracic spinal cord signal and morphology is within normal limits. Beginning at T9-T10 there is suggestion of abnormal increased central cord signal (series 16, image 31), although no definite cord expansion. By the mid T11 level the cord signal normalizes, and the visible conus appears normal, terminating at T12-L1. Fairly capacious thoracic spinal canal. Paraspinal and other soft tissues: Thoracic aortic dissection with evidence of stent redemonstrated on series 16, image 16. Left pleural effusion appears regressed. Negative visible upper abdominal viscera. There is patchy nonspecific T2 and STIR hyperintensity in the midthoracic erector spinal muscles in the midline posteriorly (series 14, image 10 and series 16, image 20). Little if any muscle expansion. Other paraspinal soft tissues are within normal limits. Disc levels: Endplate degeneration but no thoracic spinal stenosis. No discrete thoracic disc herniation. IMPRESSION: 1. 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 2. More questionably abnormal spinal cord at the T9-T10 and T10-T11 level where central cord signal abnormality without cord expansion might indicate a segmental cord infarct in this clinical setting. But the remaining thoracic spinal cord and conus appear normal. 3. Nonspecific mild bilateral posterior paraspinal muscle inflammation at the midthoracic level (T6 through T9). 4. Other cervical  spine degeneration including up to severe multilevel cervical neural foraminal stenosis, and also mild spinal stenosis at C6-C7. 5. Thoracic aortic dissection and endograft re-demonstrated. Electronically Signed   By: Genevie Ann M.D.   On: 06/15/2019 17:20   Mr Thoracic Spine Wo Contrast  Result Date: 06/15/2019 CLINICAL DATA:  58 year old male with acute myelopathy. Known aortic dissection, recently treated with stenting. EXAM: MRI CERVICAL AND THORACIC SPINE WITHOUT CONTRAST TECHNIQUE: Multiplanar and multiecho pulse sequences of the cervical spine, to include the craniocervical junction and cervicothoracic junction, and the thoracic spine, were obtained without intravenous contrast. COMPARISON:  CTA CT Chest, Abdomen, and Pelvis 06/13/2019 and earlier. No prior cervical spine imaging, but brain MRI 12/13/2016. FINDINGS: MRI CERVICAL SPINE FINDINGS Alignment: Straightening of cervical lordosis. No spondylolisthesis. Vertebrae: Degenerative appearing endplate marrow signal changes at C5-C6 with faint marrow edema. No other marrow edema or evidence of acute osseous abnormality. Background bone marrow signal is within normal limits. Cord: Mass effect on the cervical spinal cord at C4-C5 and C5-C6. Is difficult to exclude abnormal cord signal at both levels such as due to myelomalacia due to compressive myelopathy (series 8, image 24 at C5-C6). And  there is evidence of abnormal signal on series 8, image 24 greater on the left. Above and below those levels spinal cord signal and morphology is within normal limits. Posterior Fossa, vertebral arteries, paraspinal tissues: Cervicomedullary junction is within normal limits. Negative visible posterior fossa. Preserved major vascular flow voids in the neck. The left vertebral artery appears dominant. Negative neck soft tissues. Disc levels: Degenerative changes notable for: C3-C4: Right eccentric circumferential disc osteophyte complex with mild to moderate right C4  foraminal stenosis. C4-C5: Circumferential disc osteophyte complex with broad-based posterior component. Spinal stenosis with mild to moderate spinal cord mass effect (series 8, image 20). Severe left and moderate to severe right C5 foraminal stenosis. C5-C6: Disc space loss with circumferential disc osteophyte complex. Broad-based posterior component. Mild spinal stenosis and mild to moderate cord mass effect. Severe bilateral C6 foraminal stenosis. C6-C7: Circumferential disc osteophyte complex mostly affecting the neural foramina. Borderline to mild spinal stenosis without cord mass effect. Severe bilateral C7 foraminal stenosis. C7-T1: Mild to moderate facet hypertrophy greater on the left. Mild to moderate left C8 foraminal stenosis. MRI THORACIC SPINE FINDINGS Thoracic spine segmentation:  Normal on the comparison CT. Alignment:  Preserved thoracic kyphosis.  No spondylolisthesis. Vertebrae: Mild endplate irregularity including superiorly at T8 appears to be stable and chronic. STIR hyperintensity at the inferior T6 endplate appears to be related to a Schmorl's node (series 14, image 10). No acute osseous abnormality identified. Cord: Above T10 the thoracic spinal cord signal and morphology is within normal limits. Beginning at T9-T10 there is suggestion of abnormal increased central cord signal (series 16, image 31), although no definite cord expansion. By the mid T11 level the cord signal normalizes, and the visible conus appears normal, terminating at T12-L1. Fairly capacious thoracic spinal canal. Paraspinal and other soft tissues: Thoracic aortic dissection with evidence of stent redemonstrated on series 16, image 16. Left pleural effusion appears regressed. Negative visible upper abdominal viscera. There is patchy nonspecific T2 and STIR hyperintensity in the midthoracic erector spinal muscles in the midline posteriorly (series 14, image 10 and series 16, image 20). Little if any muscle expansion. Other  paraspinal soft tissues are within normal limits. Disc levels: Endplate degeneration but no thoracic spinal stenosis. No discrete thoracic disc herniation. IMPRESSION: 1. 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 2. More questionably abnormal spinal cord at the T9-T10 and T10-T11 level where central cord signal abnormality without cord expansion might indicate a segmental cord infarct in this clinical setting. But the remaining thoracic spinal cord and conus appear normal. 3. Nonspecific mild bilateral posterior paraspinal muscle inflammation at the midthoracic level (T6 through T9). 4. Other cervical spine degeneration including up to severe multilevel cervical neural foraminal stenosis, and also mild spinal stenosis at C6-C7. 5. Thoracic aortic dissection and endograft re-demonstrated. Electronically Signed   By: Genevie Ann M.D.   On: 06/15/2019 17:20   Mr Lumbar Spine Wo Contrast  Result Date: 06/15/2019 CLINICAL DATA:  58 year old male with thoracic aortic dissection recently treated with stent/endograft. Myelopathy. Cervical and thoracic MRI earlier today. EXAM: MRI LUMBAR SPINE WITHOUT CONTRAST TECHNIQUE: Multiplanar, multisequence MR imaging of the lumbar spine was performed. No intravenous contrast was administered. COMPARISON:  Cervical and thoracic MRI earlier today. Supreme stent CT Chest, Abdomen, and Pelvis. 06/13/2019 FINDINGS: Segmentation:  Normal. Alignment:  Stable lumbar lordosis. Vertebrae: Background bone marrow signal is within normal limits. There are degenerative endplate marrow  signal changes at both L4-L5 and L5-S1, and there is rightward endplate marrow edema at L4-L5 (series 6, image 6), however there is some preserved fatty degenerative endplate marrow signal at both levels. See additional L4-L5 and L5-S1 details below. No other acute osseous abnormality  identified. No sacral edema and negative visible SI joints. Conus medullaris and cauda equina: Conus extends to the T12-L1 level, the lower thoracic spinal cord was described earlier today. Paraspinal and other soft tissues: There is abnormal prevertebral and retroperitoneal material in the lower abdomen and pelvis best seen on series 6, image 9. Some of this appears to be anterior to the aortoiliac bifurcation, but also is located in the lower lumbar and sacral prevertebral space. This seems to be new since the CT on 06/13/2019, but is of unclear etiology and significance. Furthermore, note that although the L4-L5 endplates demonstrate some marrow edema eccentric to the right, the adjacent right psoas muscle seems to remain normal on series 8, image 24. Both psoas muscles appear to remain normal throughout their course. Otherwise abnormal kidneys and fusiform aneurysmal enlargement of the abdominal aorta are redemonstrated. The posterior paraspinal soft tissues appear negative. Disc levels: L1-L2: Mild L1 foraminal stenosis greater on the left related to disc bulging and posterior element hypertrophy. L2-L3: Multifactorial mild to moderate spinal stenosis related to circumferential disc bulge, posterior element hypertrophy, and epidural lipomatosis. Moderate left greater than right L3 foraminal stenosis. L3-L4: Moderate to severe multifactorial spinal stenosis related to circumferential disc bulge, posterior element hypertrophy and some epidural lipomatosis. See series 8, image 19. Moderate bilateral L3 foraminal stenosis. L4-L5: Increased T2 and STIR signal in the central and right disc. There was some vacuum phenomena here on the recent CT. Bulky underlying circumferential disc osteophyte complex eccentric to the right. Moderate posterior element hypertrophy. Mild spinal and moderate lateral recess stenosis. Mild to moderate left and severe right L4 foraminal stenosis. L5-S1: Similar increased T2 signal in the  disc and bulky circumferential disc osteophyte complex although no convincing endplate marrow edema at this level. Mild epidural lipomatosis and posterior element hypertrophy. Moderate to severe bilateral L5 foraminal stenosis. IMPRESSION: 1. The dominant finding is new space-occupying material in the lower retroperitoneum and lumbosacral junction prevertebral space since the CT on 06/13/19. And although the L4-L5 disc and right endplates appear abnormal, the adjacent psoas muscle seem unaffected. However, in discussing this case just now with Dr. 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 Furthermore, we discussed that the hematocrit has been stable since 06/13/2019 - arguing against retroperitoneal hematoma. 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 No drainable fluid collection/abscess is identified. 2. Underlying lower lumbar spine degeneration with multifactorial moderate to severe spinal and/or neural foraminal stenosis L3-L4 through L5-S1. 3. Abdominal aortic aneurysm and native renal atrophy partially redemonstrated. Electronically Signed   By: Genevie Ann M.D.   On: 06/15/2019 20:29   Dg Chest Port 1 View  Result Date: 06/15/2019 CLINICAL DATA:  Reason for exam: ESRD. Sepsis Hx HTN, pneumonia, wide-complex tachycardia EXAM: PORTABLE CHEST 1 VIEW COMPARISON:  CT of the chest on 06/13/2019 performed at [facility]. Chest x-ray on 06/04/2019 from [place]: Patient has RIGHT-sided dialysis catheter, tip overlying the level of the superior vena cava. Patient has had aortic stent graft since the previous chest x-ray. There is subsegmental atelectasis at both lung bases. CT exclude early LEFT LOWER lobe infiltrate. IMPRESSION: 1. Bibasilar  atelectasis. 2. LEFT LOWER lobe infiltrate cannot be excluded. Electronically Signed   By: Nolon Nations M.D.   On:  06/15/2019 14:43     Scheduled Meds:  amLODipine  5 mg Oral QHS   atorvastatin  20 mg Oral q1800   darbepoetin (ARANESP) injection - DIALYSIS  100 mcg Intravenous Q Sat-HD   ferric citrate  630 mg Oral TID WC   heparin       hydrALAZINE  100 mg Oral TID   labetalol  300 mg Oral BID   losartan  50 mg Oral BID   minoxidil  2.5 mg Oral BID   sodium chloride flush  3 mL Intravenous Q12H   Continuous Infusions:  ceFEPime (MAXIPIME) IV 2 g (06/14/19 1810)   vancomycin 1,000 mg (06/14/19 0920)     LOS: 4 days   Time Spent in minutes   45 minutes  Ahria Slappey D.O. on 06/17/2019 at 9:57 AM  Between 7am to 7pm - Please see pager noted on [email or web address]  After 7pm go to [email or web address]  And look for the night coverage person covering for me after hours  [facility] Office  [phone]

## 2019-06-17 NOTE — Progress Notes (Signed)
[place] for Infectious Disease  Date of Admission:  06/13/2019     Total days of antibiotics 5         ASSESSMENT/PLAN  Frank Fuller continues to have back pain and new onset chest pain following dialysis today with no significant findings on EKG.  Lower extremity weakness remains likely from cord ischemia status post treatment of his aortic dissection with no current surgical interventions recommended.  Question if infection is present with discitis with interventional radiology attempting aspiration.  Blood cultures remain without growth to date.  He does have congestion and a cough today and is requiring supplemental oxygen which is concerning for potential pneumonia and is currently covered with cefepime through broad-spectrum coverage with vancomycin and cefepime for potential discitis.  Leukocytosis has improved since yesterday.  Await IR aspiration and continue current dose of vancomycin and cefepime.  1.  Continue cefepime and vancomycin 2.  Pain management per primary team 3.  Continue to monitor for hypoxia and fevers. 4.  Await potential aspiration of discitis by interventional radiology.   Principal Problem:   Lumbar discitis Active Problems:   ESRD on hemodialysis (HCC)   Ileus (HCC)   Proctitis   H/O endovascular stent graft for abdominal aortic aneurysm    amLODipine  5 mg Oral QHS   atorvastatin  20 mg Oral q1800   darbepoetin (ARANESP) injection - DIALYSIS  100 mcg Intravenous Q Sat-HD   ferric citrate  630 mg Oral TID WC   heparin       hydrALAZINE  100 mg Oral TID   labetalol  300 mg Oral BID   losartan  50 mg Oral BID   minoxidil  2.5 mg Oral BID   sodium chloride flush  3 mL Intravenous Q12H    SUBJECTIVE:  Afebrile overnight with improved leukocytosis with new white blood cell count of 19.6.  Recently returned from dialysis which she was unable to complete with slightly irregular heart rate noted and EKG with no ST segment changes.   Continues to feel back pain, left-sided chest pain and overall malaise.  Allergies  Allergen Reactions   Oxycodone Nausea Only   Chlorhexidine Other (See Comments)    Unknown reaction Patch skin test done at dialysis 06/26/17  - staff using clear dressing and alcohol to clean exit site of catheter   Clonidine Derivatives Other (See Comments)    Dizziness    Lisinopril Other (See Comments)    unresponsive   Carvedilol Rash     Review of Systems: Review of Systems  Constitutional: Positive for malaise/fatigue. Negative for chills, fever and weight loss.  Respiratory: Positive for cough. Negative for shortness of breath and wheezing.   Cardiovascular: Positive for chest pain. Negative for leg swelling.  Gastrointestinal: Negative for abdominal pain, constipation, diarrhea, nausea and vomiting.  Musculoskeletal: Positive for back pain.  Skin: Negative for rash.      OBJECTIVE: Vitals:   06/17/19 0900 06/17/19 0929 06/17/19 0946 06/17/19 1033  BP: (!) 158/79 (!) 182/71 (!) 172/93 (!) 165/99  Pulse: (!) 109 72 72 (!) 107  Resp: 17 18 12    Temp:  98.6 F (37 C)    TempSrc:  Oral Oral   SpO2: 94% 94%  96%  Weight:      Height:       Body mass index is 25.36 kg/m.  Physical Exam Constitutional:      General: He is not in acute distress.    Appearance: He is well-developed.  He is ill-appearing.     Interventions: Nasal cannula in place.     Comments: Lying in bed with head of bed elevated; lethargic  Cardiovascular:     Rate and Rhythm: Normal rate and regular rhythm.     Heart sounds: Normal heart sounds.  Pulmonary:     Effort: Pulmonary effort is normal.     Breath sounds: Normal breath sounds.  Skin:    General: Skin is warm and dry.  Neurological:     Mental Status: He is alert and oriented to person, place, and time.     Comments: Unchanged from previous.  Psychiatric:        Behavior: Behavior normal.        Thought Content: Thought content normal.         Judgment: Judgment normal.     Lab Results Lab Results  Component Value Date   WBC 19.6 (H) 06/17/2019   HGB 8.1 (L) 06/17/2019   HCT 24.5 (L) 06/17/2019   MCV 94.6 06/17/2019   PLT 437 (H) 06/17/2019    Lab Results  Component Value Date   CREATININE 12.29 (H) 06/17/2019   BUN 77 (H) 06/17/2019   NA 128 (L) 06/17/2019   K 4.6 06/17/2019   CL 90 (L) 06/17/2019   CO2 23 06/17/2019    Lab Results  Component Value Date   ALT <5 05/27/2019   AST 12 (L) 05/27/2019   ALKPHOS 55 05/27/2019   BILITOT 0.5 05/27/2019     Microbiology: Recent Results (from the past 240 hour(s))  Culture, blood (routine x 2)     Status: None (Preliminary result)   Collection Time: 06/15/19 11:20 AM   Specimen: BLOOD LEFT HAND  Result Value Ref Range Status   Specimen Description BLOOD LEFT HAND  Final   Special Requests   Final    BOTTLES DRAWN AEROBIC ONLY Blood Culture results may not be optimal due to an inadequate volume of blood received in culture bottles   Culture   Final    NO GROWTH 2 DAYS Performed at [facility], [place] [address], [place], [place] [address]    Report Status PENDING  Incomplete  Culture, blood (routine x 2)     Status: None (Preliminary result)   Collection Time: 06/15/19 11:28 AM   Specimen: BLOOD  Result Value Ref Range Status   Specimen Description BLOOD LEFT ANTECUBITAL  Final   Special Requests   Final    BOTTLES DRAWN AEROBIC ONLY Blood Culture results may not be optimal due to an inadequate volume of blood received in culture bottles   Culture   Final    NO GROWTH 2 DAYS Performed at [facility], [place] [address], [place], [place] [address]    Report Status PENDING  Incomplete     Terri Piedra, [place] [facility] [phone] Pager  06/17/2019  11:22 AM

## 2019-06-17 NOTE — Progress Notes (Signed)
OT Cancellation Note  Patient Details Name: Frank Fuller MRN: [id number] DOB: May 11, 1961   Cancelled Treatment:    Reason Eval/Treat Not Completed: Patient at procedure or test/ unavailable. Pt in hemodialysis.  [place], OTR/L Acute Rehab Services Pager [phone] Office [phone]     Almon Register 06/17/2019, 7:40 AM

## 2019-06-17 NOTE — Progress Notes (Signed)
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 hopefully improve with PT OT Leukocytosis improved on antibiotics low threshold to remove HD cath if spikes fever or blood culture +  Ruta Hinds 06/17/2019 1:29 PM --  Laboratory Lab Results: Recent Labs    06/16/19 0617 06/17/19 0700  WBC 27.2* 19.6*  HGB 8.3* 8.1*  HCT 24.8* 24.5*  PLT 375 437*   BMET Recent Labs    06/16/19 0617 06/17/19 0534  NA 130* 128*  K 4.2 4.6  CL 91* 90*  CO2 24 23  GLUCOSE 116* 109*  BUN 59* 77*  CREATININE 10.50* 12.29*  CALCIUM 8.7* 8.6*    COAG Lab Results  Component Value Date   INR 1.3 (H) 06/17/2019   INR 1.3 (H) 06/05/2019   INR 1.3 (H) 06/03/2019   No results found for: PTT

## 2019-06-18 ENCOUNTER — Other Ambulatory Visit: Payer: Self-pay | Admitting: Vascular Surgery

## 2019-06-18 ENCOUNTER — Inpatient Hospital Stay (HOSPITAL_COMMUNITY): Payer: Medicare Other

## 2019-06-18 DIAGNOSIS — I7101 Dissection of thoracic aorta: Secondary | ICD-10-CM

## 2019-06-18 DIAGNOSIS — I71019 Dissection of thoracic aorta, unspecified: Secondary | ICD-10-CM

## 2019-06-18 DIAGNOSIS — I714 Abdominal aortic aneurysm, without rupture, unspecified: Secondary | ICD-10-CM

## 2019-06-18 DIAGNOSIS — I6389 Other cerebral infarction: Secondary | ICD-10-CM

## 2019-06-18 LAB — BASIC METABOLIC PANEL
Anion gap: 13 (ref 5–15)
BUN: 55 mg/dL — ABNORMAL HIGH (ref 6–20)
CO2: 25 mmol/L (ref 22–32)
Calcium: 8.8 mg/dL — ABNORMAL LOW (ref 8.9–10.3)
Chloride: 95 mmol/L — ABNORMAL LOW (ref 98–111)
Creatinine, Ser: 9.65 mg/dL — ABNORMAL HIGH (ref 0.61–1.24)
GFR calc Af Amer: 6 mL/min — ABNORMAL LOW (ref >60)
GFR calc non Af Amer: 5 mL/min — ABNORMAL LOW (ref >60)
Glucose, Bld: 110 mg/dL — ABNORMAL HIGH (ref 70–99)
Potassium: 4.6 mmol/L (ref 3.5–5.1)
Sodium: 133 mmol/L — ABNORMAL LOW (ref 135–145)

## 2019-06-18 LAB — CBC
HCT: 24.5 % — ABNORMAL LOW (ref 39.0–52.0)
Hemoglobin: 8 g/dL — ABNORMAL LOW (ref 13.0–17.0)
MCH: 31 pg (ref 26.0–34.0)
MCHC: 32.7 g/dL (ref 30.0–36.0)
MCV: 95 fL (ref 80.0–100.0)
Platelets: 441 10*3/uL — ABNORMAL HIGH (ref 150–400)
RBC: 2.58 MIL/uL — ABNORMAL LOW (ref 4.22–5.81)
RDW: 17 % — ABNORMAL HIGH (ref 11.5–15.5)
WBC: 13.3 10*3/uL — ABNORMAL HIGH (ref 4.0–10.5)
nRBC: 0 % (ref 0.0–0.2)

## 2019-06-18 LAB — VANCOMYCIN, RANDOM: Vancomycin Rm: 34

## 2019-06-18 MED ORDER — VANCOMYCIN VARIABLE DOSE PER UNSTABLE RENAL FUNCTION (PHARMACIST DOSING): Status: DC

## 2019-06-18 MED ORDER — POLYETHYLENE GLYCOL 3350 17 G PO PACK
17.0000 g | PACK | Freq: Every day | ORAL | Status: DC
  Administered 2019-06-19: 17 g via ORAL
  Filled 2019-06-18 (×5): qty 1

## 2019-06-18 NOTE — Progress Notes (Signed)
PROGRESS NOTE    Frank Fuller  [id number] DOB: November 15, 1960 DOA: 06/13/2019 PCP: [facility]   Brief Narrative:  Patient is a 58 year old male with history of hypertension, ESRD on dialysis( Tuesday,Thursday,Saturday), abdominal aortic aneurysm status post stent, hepatitis C virus status post treatment, depression, CVA who initially presented to [facility] with multiple complaints.  He was recently hospitalized from 8/11-8/20 for dissection of thoracic aorta(type B) and was treated with endovascular stent graft with spinal drain by Dr. Oneida Alar on 8/14.  He was discharged home but immediately came back with complaints of urge to use the restroom but unable to do so.  He also had fever.  He was found to have paraplegia.  He was suspected to have thoracic ischemia.  CT imaging also showed possible osteomyelitis/discitis of L4-L5.  Also suspected to have sepsis secondary to possible pneumonia. Underwent disc aspiration by IR on 06/18/2019.  Neurosurgery, vascular surgery, ID following.  Assessment & Plan:   Principal Problem:   Lumbar discitis Active Problems:   ESRD on hemodialysis (HCC)   Ileus (HCC)   Proctitis   H/O endovascular stent graft for abdominal aortic aneurysm   Suspected sepsis secondary to pneumonia discitis/osteomyelitis: Presented with fever of 102F, leukocytosis.  CRP/procalcitonin was elevated.  CT also showed possible right-sided pneumonia with Iileus and proctitis.  Cultures from [facility] did not show any growth.  Cultures sent here has not shown any growth yet.  Started on broad-spectrum antibiotics with vancomycin and cefepime.  Currently is afebrile.  Leukocytosis improving. MRI lumbar spine showed new space-occupying material in the lower retroperitoneum and lumbosacral junction.  Favored acute discitis and osteomyelitis at L4-L5 with pervertebral phlegmon.  Neurosurgery , interventional radiology consulted .S/P disc aspiration.Infectious disease  following.  Hypoxia: Noted to be hypoxic with desaturation on room air.  Continue supplemental oxygen.  CT had shown left pleural effusion, proBNP was elevated.  Continue volume control with dialysis.  Abdomen pain/acute ileus/proctitis: Noted on CT imaging at outside hospital showing signs of ileus with large stool burden and inflammation of rectum.  He was also complaining of nausea.  He said he had a bowel movement today.  Tolerating diet.  Abdomen slightly distended and has mild generalized tenderness.  Will check abdominal x-ray.  ESRD on dialysis:ESRD on dialysis( Tuesday,Thursday,Saturday).  Nephrology following.  Dialyzed yesterday.  Anemia of chronic disease: Associated with ESRD.  Currently CBC stable.  Continue to monitor.  Left upper extremity swelling: Doppler ultrasound did not show any DVT.  Continue to monitor  Thoracic aortic dissection: Status post endovascular stent placement.  Vascular surgery was following  Hypertension: Currently blood pressure stable.  Continue current medicines .Continue to monitor blood pressure.  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  Abnormal spinal cord at T9-T10, T10-11, possible segmental cord infarct.  Neurosurgery consulted but signed off.  No plan for intervention.  Awaiting PT/OT evaluation.        DVT prophylaxis: SCD Code Status: Full Family Communication: None present at the bedside Disposition Plan: Pending PT/OT evaluation.  Not medically ready for discharge   Consultants: Nephrology, ID, vascular surgery  Procedures: Dialysis, disc aspiration Antimicrobials:  Anti-infectives (From admission, onward)   Start     Dose/Rate Route Frequency Ordered Stop   06/18/19 0808  vancomycin variable dose per unstable renal function (pharmacist dosing)      Does not apply See admin  instructions 06/18/19 [id number]  06/14/19 1800  ceFEPIme (MAXIPIME) 2 g in sodium chloride 0.9 % 100 mL IVPB     2 g 200 mL/hr over 30 Minutes Intravenous Every T-Th-Sa (1800) 06/13/19 1540     06/14/19 1200  vancomycin (VANCOCIN) IVPB 1000 mg/200 mL premix  Status:  Discontinued     1,000 mg 200 mL/hr over 60 Minutes Intravenous Every T-Th-Sa (Hemodialysis) 06/13/19 1540 06/18/19 0808   06/13/19 1545  vancomycin (VANCOCIN) 2,000 mg in sodium chloride 0.9 % 500 mL IVPB     2,000 mg 250 mL/hr over 120 Minutes Intravenous  Once 06/13/19 1519 06/13/19 1829   06/13/19 1545  ceFEPIme (MAXIPIME) 2 g in sodium chloride 0.9 % 100 mL IVPB     2 g 200 mL/hr over 30 Minutes Intravenous  Once 06/13/19 1519 06/13/19 2241      Subjective:  Patient seen and examined at bedside this morning.  Was complaining of right-sided upper back pain.  Complains of some shortness of breath, abdominal pain.  Had a bowel movement this morning and is tolerating diet.  Objective: Vitals:   06/17/19 1510 06/17/19 1709 06/17/19 2041 06/18/19 0514  BP: (!) 154/117 (!) 150/67 (!) 151/70 (!) 151/67  Pulse: 71 61 (!) 48 67  Resp: 18  16 18   Temp:   98.8 F (37.1 C) 99.2 F (37.3 C)  TempSrc:   Oral Oral  SpO2: 96%  94% 99%  Weight:    93.2 kg  Height:        Intake/Output Summary (Last 24 hours) at 06/18/2019 0853 Last data filed at 06/18/2019 0600 Gross per 24 hour  Intake 345.01 ml  Output 481 ml  Net -135.99 ml   Filed Weights   06/17/19 0650 06/17/19 0929 06/18/19 0514  Weight: 94.5 kg 94.2 kg 93.2 kg    Examination:  General exam: Generalized weakness/debility HEENT:PERRL,Oral mucosa moist, Ear/Nose normal on gross exam Respiratory system: Bilateral equal air entry, normal vesicular breath sounds, no wheezes or crackles  Cardiovascular system: S1 & S2 heard, RRR. No JVD, murmurs, rubs, gallops or clicks. No pedal edema.  Temporary dialysis catheter on the right chest Gastrointestinal system: Abdomen  is mildy distended, soft and has mild generalized tenderness. No organomegaly or masses felt. Normal bowel sounds heard. Central nervous system: Alert and oriented. No focal neurological deficits. Extremities: No edema, no clubbing ,no cyanosis, distal peripheral pulses palpable. Skin: No rashes, lesions or ulcers,no icterus ,no pallor  Data Reviewed: I have personally reviewed following labs and imaging studies  CBC: Recent Labs  Lab 06/14/19 0449 06/15/19 0619 06/16/19 0617 06/17/19 0700 06/18/19 0557  WBC 21.2* 26.1* 27.2* 19.6* 13.3*  HGB 8.0* 8.9* 8.3* 8.1* 8.0*  HCT 24.8* 27.2* 24.8* 24.5* 24.5*  MCV 96.5 95.4 93.2 94.6 95.0  PLT 321 364 375 437* [id number]*   Basic Metabolic Panel: Recent Labs  Lab 06/14/19 0449 06/15/19 0619 06/16/19 0617 06/17/19 0534 06/18/19 0557  NA 130* 132* 130* 128* 133*  K 4.5 4.5 4.2 4.6 4.6  CL 91* 92* 91* 90* 95*  CO2 22 24 24 23 25   GLUCOSE 85 108* 116* 109* 110*  BUN 50* 38* 59* 77* 55*  CREATININE 9.69* 7.81* 10.50* 12.29* 9.65*  CALCIUM 8.6* 9.1 8.7* 8.6* 8.8*  PHOS  --  5.2* 5.0*  --   --    GFR: Estimated Creatinine Clearance: 10 mL/min (A) (by C-G formula based on SCr of 9.65 mg/dL (H)). Liver Function Tests: Recent Labs  Lab 06/15/19 [id number] 06/16/19 0617  ALBUMIN  2.0* 1.9*   No results for input(s): LIPASE, AMYLASE in the last 168 hours. No results for input(s): AMMONIA in the last 168 hours. Coagulation Profile: Recent Labs  Lab 06/17/19 0534  INR 1.3*   Cardiac Enzymes: No results for input(s): CKTOTAL, CKMB, CKMBINDEX, TROPONINI in the last 168 hours. BNP (last 3 results) No results for input(s): PROBNP in the last 8760 hours. HbA1C: Recent Labs    06/16/19 0617  HGBA1C 5.0   CBG: Recent Labs  Lab 06/15/19 1708 06/15/19 2119 06/15/19 2358 06/16/19 0353 06/16/19 0733  GLUCAP 109* 127* 117* 129* 116*   Lipid Profile: Recent Labs    06/16/19 0617  CHOL 128  HDL <10*  LDLCALC NOT CALCULATED  TRIG 378*   CHOLHDL NOT CALCULATED   Thyroid Function Tests: No results for input(s): TSH, T4TOTAL, FREET4, T3FREE, THYROIDAB in the last 72 hours. Anemia Panel: No results for input(s): VITAMINB12, FOLATE, FERRITIN, TIBC, IRON, RETICCTPCT in the last 72 hours. Sepsis Labs: No results for input(s): PROCALCITON, LATICACIDVEN in the last 168 hours.  Recent Results (from the past 240 hour(s))  Culture, blood (routine x 2)     Status: None (Preliminary result)   Collection Time: 06/15/19 11:20 AM   Specimen: BLOOD LEFT HAND  Result Value Ref Range Status   Specimen Description BLOOD LEFT HAND  Final   Special Requests   Final    BOTTLES DRAWN AEROBIC ONLY Blood Culture results may not be optimal due to an inadequate volume of blood received in culture bottles   Culture   Final    NO GROWTH 2 DAYS Performed at [facility], [place] [address] [place], [place] [address]    Report Status PENDING  Incomplete  Culture, blood (routine x 2)     Status: None (Preliminary result)   Collection Time: 06/15/19 11:28 AM   Specimen: BLOOD  Result Value Ref Range Status   Specimen Description BLOOD LEFT ANTECUBITAL  Final   Special Requests   Final    BOTTLES DRAWN AEROBIC ONLY Blood Culture results may not be optimal due to an inadequate volume of blood received in culture bottles   Culture   Final    NO GROWTH 2 DAYS Performed at [facility], [place] [address] [place], [place] [address]    Report Status PENDING  Incomplete  Aerobic/Anaerobic Culture (surgical/deep wound)     Status: None (Preliminary result)   Collection Time: 06/17/19  3:12 PM   Specimen: Intervertebral Disc; Tissue  Result Value Ref Range Status   Specimen Description WOUND  Final   Special Requests L4 L5 DISC ASPIRATION  Final   Gram Stain   Final    NO WBC SEEN NO ORGANISMS SEEN Performed at [facility], [place] [address], [place],  [address]    Culture PENDING  Incomplete   Report Status PENDING  Incomplete          Radiology Studies: Ir Lumbar Disc Aspiration W/img Guide  Result Date: 06/17/2019 INDICATION: Low back pain.  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 No drainable fluid collection/abscess is identified. EXAM: LUMBAR L4-5 DISC ASPIRATION UNDER FLUOROSCOPY MEDICATIONS: Lidocaine 1% subcutaneous ANESTHESIA/SEDATION: Intravenous Fentanyl 152mcg and Versed 2mg  were administered as conscious sedation during continuous monitoring of the patient's level of consciousness and physiological / cardiorespiratory status by the radiology RN, with a total moderate sedation time of 15 minutes. PROCEDURE: Informed written consent was obtained from the patient after a  thorough discussion of the procedural risks, benefits and alternatives. All questions were addressed. Maximal Sterile Barrier Technique was utilized including caps, mask, sterile gowns, sterile gloves, sterile drape, hand hygiene and skin antiseptic. A timeout was performed prior to the initiation of the procedure. The L4-5 interspace was identified under fluoroscopy, corresponding to previous cross-sectional imaging. An appropriate skin entry site was determined. After local infiltration with 1% lidocaine, a 17 gauge trocar needle was advanced into the interspace from left posterolateral extraforaminal approach. Needle tip position within the interspace confirmed on biplane images. Less than 1 mL of very thick opaque viscous debris were aspirated, sent for the requested laboratory studies. The patient tolerated the procedure well. FLUOROSCOPY TIME:  1.7 minutes; [id number] DAP COMPLICATIONS: None immediate. IMPRESSION: Technically successful lumbar L4-5 disc aspiration under fluoroscopy. Electronically Signed   By: Lucrezia Europe M.D.   On: 06/17/2019 16:42        Scheduled Meds:  amLODipine  5 mg Oral QHS   aspirin EC  81 mg Oral Daily   atorvastatin  20 mg Oral q1800   darbepoetin (ARANESP) injection  - DIALYSIS  100 mcg Intravenous Q Sat-HD   ferric citrate  630 mg Oral TID WC   hydrALAZINE  100 mg Oral TID   labetalol  300 mg Oral BID   losartan  50 mg Oral BID   minoxidil  2.5 mg Oral BID   sodium chloride flush  3 mL Intravenous Q12H   vancomycin variable dose per unstable renal function (pharmacist dosing)   Does not apply See admin instructions   Continuous Infusions:  ceFEPime (MAXIPIME) IV 2 g (06/17/19 1738)     LOS: 5 days    Time spent:35 mins. More than 50% of that time was spent in counseling and/or coordination of care.      Shelly Coss, MD [facility] Pager [phone]  If 7PM-7AM, please contact night-coverage [email or web address] Password [facility] 06/18/2019, 8:53 AM

## 2019-06-18 NOTE — Evaluation (Signed)
Occupational Therapy Evaluation Patient Details Name: Frank Fuller MRN: [id number] DOB: 02/12/1961 Today's Date: 06/18/2019    History of Present Illness Patient is a 58 year old male with history of hypertension, ESRD on dialysis( Tuesday,Thursday,Saturday), abdominal aortic aneurysm status post stent, hepatitis C virus status post treatment, depression, CVA who initially presented to [facility] with multiple complaints.  He was recently hospitalized from 8/11-8/20 for dissection of thoracic aorta(type B) and was treated with endovascular stent graft with spinal drain by Dr. Oneida Alar on 8/14.  He was discharged home but immediately came back with complaints of urge to use the restroom but unable to do so.  He also had fever.  He was found to have paraplegia.  He was suspected to have thoracic ischemia.  CT imaging also showed possible osteomyelitis/discitis of L4-L5.  Also suspected to have sepsis secondary to possible pneumonia. Underwent disc aspiration by IR on 06/18/2019   Clinical Impression   Pt with decline in function and safety with ADLs and ADL mobility with impaired strength, balance and endurance. Pt reports that he lives at home alone and was independent with mobility, ADLs/selfcare and was driving. Pt currently requires min A with UB ADLs, max - total A with LB ADLs, max - mod A with bed mobility and max A with sit - stand attempt from EOB. Pt reports 5/10 back pain pre activity and 8/10 post activity with therapy. Pt would benefit from acute OT services to address impairments to maximize level of function and safety    Follow Up Recommendations  CIR    Equipment Recommendations  Other (comment)(TBD at next venue of care)    Recommendations for Other Services Rehab consult     Precautions / Restrictions Precautions Precautions: Fall      Mobility Bed Mobility Overal bed mobility: Needs Assistance Bed Mobility: Rolling;Sidelying to Sit;Sit to Sidelying Rolling: Min  assist Sidelying to sit: Mod assist;+2 for safety/equipment     Sit to sidelying: Mod assist;Max assist General bed mobility comments: HOB elevated and use of bed rail; Cues for technqiue an dmod assist to help LEs off of the bed, heavy mod assist to push up to sit; Tremulous with incr time in sitting; max assist to help LEs back into bed; Rolled to perform hygeine after a BM; min assit mostly to roll with good use of rails; cues to log roll and avoid twisting   Transfers Overall transfer level: Needs assistance Equipment used: 1 person hand held assist Transfers: Sit to/from Stand Sit to Stand: Max assist         General transfer comment: bilateral support a gait belt and knees blocked; Max asssit; did not get to fully standing, just a squat; attempted helping with hygiene in semi-stand, but unable to hold position long enough    Balance Overall balance assessment: Needs assistance Sitting-balance support: Feet supported;No upper extremity supported Sitting balance-Leahy Scale: Poor       Standing balance-Leahy Scale: Zero                             ADL either performed or assessed with clinical judgement   ADL Overall ADL's : Needs assistance/impaired Eating/Feeding: Set up;Sitting;Bed level   Grooming: Wash/dry hands;Wash/dry face;Min guard;Sitting   Upper Body Bathing: Minimal assistance;Sitting   Lower Body Bathing: Maximal assistance   Upper Body Dressing : Minimal assistance;Sitting   Lower Body Dressing: Total assistance   Toilet Transfer: Maximal assistance Toilet Transfer Details (indicate  cue type and reason): attempted sit - stand from bed, pt unable and was soiled with BM Toileting- Clothing Manipulation and Hygiene: Total assistance;Bed level       Functional mobility during ADLs: Maximal assistance       Vision         Perception     Praxis      Pertinent Vitals/Pain Pain Assessment: 0-10 Pain Score: 8  Pain Location: back,  5/10 before activity and 8/10 after activity Pain Descriptors / Indicators: Aching;Discomfort;Sore Pain Intervention(s): Monitored during session;Patient requesting pain meds-RN notified;Repositioned     Hand Dominance Right   Extremity/Trunk Assessment Upper Extremity Assessment Upper Extremity Assessment: Defer to OT evaluation   Lower Extremity Assessment Lower Extremity Assessment: RLE deficits/detail;LLE deficits/detail RLE Deficits / Details: P/AA/ROM WFL; Decr strength: Hip flexion, abd 2+/5; knee ext 2+/5; ankle df/pf 2/5; tested supine RLE: Unable to fully assess due to pain RLE Coordination: decreased fine motor LLE Deficits / Details: P/AA/ROM WFL; Decr strength: Hip flexion, abd 2+/5; knee ext 2+/5; ankle df/pf 2/5; tested supine LLE: Unable to fully assess due to pain LLE Coordination: decreased fine motor       Communication Communication Communication: No difficulties   Cognition Arousal/Alertness: Awake/alert Behavior During Therapy: WFL for tasks assessed/performed Overall Cognitive Status: No family/caregiver present to determine baseline cognitive functioning                                     General Comments  Session conducted on supplemental O2    Exercises     Shoulder Instructions      Home Living Family/patient expects to be discharged to:: Private residence Living Arrangements: Alone Available Help at Discharge: Family;Available PRN/intermittently Type of Home: House Home Access: Stairs to enter [employer] of Steps: 4 Entrance Stairs-Rails: Can reach both;Left;Right Home Layout: One level     Bathroom Shower/Tub: Occupational psychologist: Standard     Home Equipment: Grab bars - tub/shower;Shower seat;Hand held shower head          Prior Functioning/Environment Level of Independence: Independent        Comments: ambulating without AD, driving and independent in ADLs        OT Problem  List: Decreased strength;Impaired balance (sitting and/or standing);Decreased cognition;Pain;Decreased activity tolerance;Decreased coordination;Decreased knowledge of use of DME or AE      OT Treatment/Interventions: Self-care/ADL training;DME and/or AE instruction;Therapeutic activities;Therapeutic exercise;Patient/family education    OT Goals(Current goals can be found in the care plan section) Acute Rehab OT Goals Patient Stated Goal: get home OT Goal Formulation: With patient Time For Goal Achievement: 06/25/19 Potential to Achieve Goals: Good ADL Goals Pt Will Perform Grooming: with set-up;with modified independence;sitting Pt Will Perform Upper Body Bathing: with min guard assist;sitting Pt Will Perform Lower Body Bathing: with mod assist;sitting/lateral leans Pt Will Perform Upper Body Dressing: with min guard assist;sitting Pt Will Transfer to Toilet: with mod assist;stand pivot transfer;bedside commode Additional ADL Goal #1: Pt will complete bed mobility with mod A to sit EOB for ADLs and in prep for transfers  OT Frequency: Min 2X/week   Barriers to D/C: Decreased caregiver support          Co-evaluation PT/OT/SLP Co-Evaluation/Treatment: Yes Reason for Co-Treatment: Complexity of the patient's impairments (multi-system involvement);Necessary to address cognition/behavior during functional activity;For patient/therapist safety;To address functional/ADL transfers PT goals addressed during session: Mobility/safety with mobility OT goals addressed  during session: ADL's and self-care      AM-PAC OT "6 Clicks" Daily Activity     Outcome Measure Help from another person eating meals?: None Help from another person taking care of personal grooming?: A Little Help from another person toileting, which includes using toliet, bedpan, or urinal?: Total Help from another person bathing (including washing, rinsing, drying)?: A Lot Help from another person to put on and taking off  regular upper body clothing?: A Little Help from another person to put on and taking off regular lower body clothing?: Total 6 Click Score: 14   End of Session Equipment Utilized During Treatment: Gait belt;Oxygen Nurse Communication: Mobility status  Activity Tolerance: Patient limited by fatigue;Patient limited by pain Patient left: in chair;with call bell/phone within reach;with chair alarm set  OT Visit Diagnosis: Muscle weakness (generalized) (M62.81);Pain;Other abnormalities of gait and mobility (R26.89) Pain - Right/Left: (back) Pain - part of body: (back)                Time: [id number] OT Time Calculation (min): 28 min Charges:  OT General Charges $OT Visit: 1 Visit OT Evaluation $OT Eval Moderate Complexity: 1 Mod OT Treatments $Self Care/Home Management : 8-22 mins    Britt Bottom 06/18/2019, 1:07 PM

## 2019-06-18 NOTE — Progress Notes (Signed)
Pharmacy Antibiotic Note  Frank Fuller is a 58 y.o. male here with sepsis.  He recently had TEVAR and was discharged home. Pharmacy has been consulted for Vancomycin and Cefepime dosing  The patient is ESRD-TTS with short HD sessions noted on 8/22 (3 hr BFR 400) and 8/25 (2.5 hr BFR 400). The dose after HD on 8/25 was held and a Vancomycin random was drawn on 8/26 AM which resulted as SUPRAtherapeutic (VR 34 mcg/ml, goal of 15-25 mcg/ml). Given the high level - it appears the patient also might have gotten a dose at [facility] prior to transfer. Will need to hold with the patient's next HD session, then Vancomycin dosing can be resumed.    Plan: - Hold Vancomycin with the next HD session - presumably on Thurs, 8/28 - Will resume Vancomycin 1g on TTS starting on Sat, 8/30 - Will f/u with renal to see if plans for HD are adjusted - no standing Vanc orders for now - Continue Cefepime 2g on TTS @ 1800 - Will continue to follow HD schedule/duration, culture results, LOT, and antibiotic de-escalation plans   Height: 6\' 4"  (193 cm) Weight: 205 lb 7.5 oz (93.2 kg) IBW/kg (Calculated) : 86.8  Temp (24hrs), Avg:98.9 F (37.2 C), Min:98.6 F (37 C), Max:99.2 F (37.3 C)  Recent Labs  Lab 06/14/19 0449 06/15/19 0619 06/16/19 0617 06/17/19 0534 06/17/19 0700 06/18/19 0557  WBC 21.2* 26.1* 27.2*  --  19.6* 13.3*  CREATININE 9.69* 7.81* 10.50* 12.29*  --  9.65*  VANCORANDOM  --   --   --   --   --  34    Estimated Creatinine Clearance: 10 mL/min (A) (by C-G formula based on SCr of 9.65 mg/dL (H)).    Allergies  Allergen Reactions  . Oxycodone Nausea Only  . Chlorhexidine Other (See Comments)    Unknown reaction Patch skin test done at dialysis 06/26/17  - staff using clear dressing and alcohol to clean exit site of catheter  . Clonidine Derivatives Other (See Comments)    Dizziness   . Lisinopril Other (See Comments)    unresponsive  . Carvedilol Rash    Vanc 8/21 >> - 8/26 VR 34  mcg/ml >> hold next dose w/HD on 8/28, can resume dosing w/ HD on 8/30 Cefepime 8/21 >>  8/23 BCx >> ngx1d 8/23 RCx >> 8/25 IR disc aspirate >> GS neg >>   Thank you for allowing pharmacy to be a part of this patient's care.  Alycia Rossetti, PharmD, BCPS Clinical Pharmacist Clinical phone for 06/18/2019: [phone] 06/18/2019 8:09 AM   **Pharmacist phone directory can now be found on [email or web address] ([email or web address]).  Listed under [place].

## 2019-06-18 NOTE — Progress Notes (Signed)
Inpatient Rehabilitation Admissions Coordinator  Patient requests I return tomorrow for further rehab discussions.  Danne Baxter, RN, MSN Rehab Admissions Coordinator [phone] 06/18/2019 4:07 PM

## 2019-06-18 NOTE — Progress Notes (Signed)
Inpatient Rehabilitation Admissions Coordinator  I continue to await therapy evaluations to assist with planning dispo.  Danne Baxter, RN, MSN Rehab Admissions Coordinator [phone] 06/18/2019 12:48 PM

## 2019-06-18 NOTE — Progress Notes (Signed)
Subjective: The patient is alert and pleasant.  Objective: Vital signs in last 24 hours: Temp:  [98.6 F (37 C)-99.2 F (37.3 C)] 99.2 F (37.3 C) (08/26 0514) Pulse Rate:  [48-125] 67 (08/26 0514) Resp:  [12-27] 18 (08/26 0514) BP: (107-182)/(67-140) 151/67 (08/26 0514) SpO2:  [94 %-100 %] 99 % (08/26 0514) Weight:  [93.2 kg-94.2 kg] 93.2 kg (08/26 0514) Estimated body mass index is 25.01 kg/m as calculated from the following:   Height as of this encounter: 6\' 4"  (1.93 m).   Weight as of this encounter: 93.2 kg.   Intake/Output from previous day: 08/25 0701 - 08/26 0700 In: 345 [P.O.:240; IV Piggyback:105] Out: 481  Intake/Output this shift: No intake/output data recorded.  Physical exam the patient is alert and oriented.  His strength is 4/5 in his bilateral iliopsoas, quadricep, gastrocnemius and dorsiflexors.  He gives away diffusely.  Lab Results: Recent Labs    06/17/19 0700 06/18/19 0557  WBC 19.6* 13.3*  HGB 8.1* 8.0*  HCT 24.5* 24.5*  PLT 437* 441*   BMET Recent Labs    06/17/19 0534 06/18/19 0557  NA 128* 133*  K 4.6 4.6  CL 90* 95*  CO2 23 25  GLUCOSE 109* 110*  BUN 77* 55*  CREATININE 12.29* 9.65*  CALCIUM 8.6* 8.8*    Studies/Results: Ir Lumbar Disc Aspiration W/img Guide  Result Date: 06/17/2019 INDICATION: Low back pain.  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 No drainable fluid collection/abscess is identified. EXAM: LUMBAR L4-5 DISC ASPIRATION UNDER FLUOROSCOPY MEDICATIONS: Lidocaine 1% subcutaneous ANESTHESIA/SEDATION: Intravenous Fentanyl 135mcg and Versed 2mg  were administered as conscious sedation during continuous monitoring of the patient's level of consciousness and physiological / cardiorespiratory status by the radiology RN, with a total moderate sedation time of 15 minutes. PROCEDURE: Informed written consent was obtained from the patient after a thorough discussion of the procedural risks,  benefits and alternatives. All questions were addressed. Maximal Sterile Barrier Technique was utilized including caps, mask, sterile gowns, sterile gloves, sterile drape, hand hygiene and skin antiseptic. A timeout was performed prior to the initiation of the procedure. The L4-5 interspace was identified under fluoroscopy, corresponding to previous cross-sectional imaging. An appropriate skin entry site was determined. After local infiltration with 1% lidocaine, a 17 gauge trocar needle was advanced into the interspace from left posterolateral extraforaminal approach. Needle tip position within the interspace confirmed on biplane images. Less than 1 mL of very thick opaque viscous debris were aspirated, sent for the requested laboratory studies. The patient tolerated the procedure well. FLUOROSCOPY TIME:  1.7 minutes; [id number] DAP COMPLICATIONS: None immediate. IMPRESSION: Technically successful lumbar L4-5 disc aspiration under fluoroscopy. Electronically Signed   By: Lucrezia Europe M.D.   On: 06/17/2019 16:42    Assessment/Plan: Thoracic ischemia: I do not think I have anything to add here.  He is on aspirin.  Cervical spondylosis and stenosis: Please have the patient follow-up with me in the office in a month or 2.  I will sign off.  Please call if I can be of further assistance.  LOS: 5 days     Ophelia Charter 06/18/2019, 8:00 AM

## 2019-06-18 NOTE — Progress Notes (Signed)
Inpatient Rehabilitation Admissions Coordinator  I met with patient at bedside to begin rehab assessment. Pt falling to sleep mid sentence as he reports receiving pain med. I will follow up later today.  Danne Baxter, RN, MSN Rehab Admissions Coordinator [phone] 06/18/2019 1:30 PM

## 2019-06-18 NOTE — Progress Notes (Signed)
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 There is also concern for vertebral osteomyelitis/discitis s/p IR aspiration with gram stain without organisms and cultures pending.Blood cultures remain without growth. He does have elevated sed rate and at present think it would be appropriate to treat him for 6 weeks with antibiotics for discitis/osteomyelitis. If he does have any type of pneumonia it will be covered with his broad spectrum coverage for currently culture negative discitis.   1. Continue current dose of cefepime and vancomycin.  2. Monitor cultures for narrowing antibiotics as able. 3. ESRD per nephrology.  Principal Problem:   Lumbar discitis Active Problems:   ESRD on hemodialysis (Frank Fuller)   Ileus (HCC)   Proctitis   H/O endovascular stent graft for abdominal aortic aneurysm   . amLODipine  5 mg Oral QHS  . aspirin EC  81 mg Oral Daily  . atorvastatin  20 mg Oral q1800  . darbepoetin (ARANESP) injection - DIALYSIS  100 mcg Intravenous Q Sat-HD  . ferric citrate  630 mg Oral TID WC  . hydrALAZINE  100 mg Oral TID  . labetalol  300 mg Oral BID  . losartan  50 mg Oral BID  . minoxidil  2.5 mg Oral BID  . sodium chloride flush  3 mL Intravenous Q12H  . vancomycin variable dose per unstable renal function (pharmacist dosing)   Does not apply See admin instructions    SUBJECTIVE:  Afebrile overnight with continued improvement of leukocytosis now with WBC count of 13.3. IR performed aspiration with cultures obtained. Remained on nasal cannula overnight. Feeling better today with aid of pain medication. Continues to have weakness in his legs.   Allergies  Allergen Reactions  . Oxycodone Nausea Only  . Chlorhexidine  Other (See Comments)    Unknown reaction Patch skin test done at dialysis 06/26/17  - staff using clear dressing and alcohol to clean exit site of catheter  . Clonidine Derivatives Other (See Comments)    Dizziness   . Lisinopril Other (See Comments)    unresponsive  . Carvedilol Rash     Review of Systems: Review of Systems  Constitutional: Negative for chills, fever and weight loss.  Respiratory: Negative for cough, shortness of breath and wheezing.   Cardiovascular: Negative for chest pain and leg swelling.  Gastrointestinal: Negative for abdominal pain, constipation, diarrhea, nausea and vomiting.  Musculoskeletal: Positive for back pain.  Skin: Negative for rash.  Neurological: Positive for focal weakness.      OBJECTIVE: Vitals:   06/17/19 2041 06/18/19 0514 06/18/19 0927 06/18/19 0927  BP: (!) 151/70 (!) 151/67  (!) 172/77  Pulse: (!) 48 67  62  Resp: 16 18  18   Temp: 98.8 F (37.1 C) 99.2 F (37.3 C) 98.2 F (36.8 C)   TempSrc: Oral Oral    SpO2: 94% 99%  100%  Weight:  93.2 kg    Height:       Body mass index is 25.01 kg/m.  Physical Exam Constitutional:      General: He is not in acute distress.    Appearance: He is well-developed.     Interventions: Nasal cannula in place.     Comments: Lying in bed resting; pleasant.  Cardiovascular:     Rate and Rhythm: Normal rate and regular rhythm.     Heart sounds: Normal heart sounds.  Pulmonary:     Effort: Pulmonary effort is normal.     Breath sounds: Rhonchi present.  Skin:    General: Skin is warm and dry.  Neurological:     Mental Status: He is oriented to person, place, and time and easily aroused. He is lethargic.  Psychiatric:        Behavior: Behavior normal.        Thought Content: Thought content normal.        Judgment: Judgment normal.     Lab Results Lab Results  Component Value Date   WBC 13.3 (H) 06/18/2019   HGB 8.0 (L) 06/18/2019   HCT 24.5 (L) 06/18/2019   MCV 95.0 06/18/2019    PLT 441 (H) 06/18/2019    Lab Results  Component Value Date   CREATININE 9.65 (H) 06/18/2019   BUN 55 (H) 06/18/2019   NA 133 (L) 06/18/2019   K 4.6 06/18/2019   CL 95 (L) 06/18/2019   CO2 25 06/18/2019    Lab Results  Component Value Date   ALT <5 05/27/2019   AST 12 (L) 05/27/2019   ALKPHOS 55 05/27/2019   BILITOT 0.5 05/27/2019     Microbiology: Recent Results (from the past 240 hour(s))  Culture, blood (routine x 2)     Status: None (Preliminary result)   Collection Time: 06/15/19 11:20 AM   Specimen: BLOOD LEFT HAND  Result Value Ref Range Status   Specimen Description BLOOD LEFT HAND  Final   Special Requests   Final    BOTTLES DRAWN AEROBIC ONLY Blood Culture results may not be optimal due to an inadequate volume of blood received in culture bottles   Culture   Final    NO GROWTH 2 DAYS Performed at [facility], [place] [address], [place], [place] [address]    Report Status PENDING  Incomplete  Culture, blood (routine x 2)     Status: None (Preliminary result)   Collection Time: 06/15/19 11:28 AM   Specimen: BLOOD  Result Value Ref Range Status   Specimen Description BLOOD LEFT ANTECUBITAL  Final   Special Requests   Final    BOTTLES DRAWN AEROBIC ONLY Blood Culture results may not be optimal due to an inadequate volume of blood received in culture bottles   Culture   Final    NO GROWTH 2 DAYS Performed at [facility], [place] [address] [place], [place] [address]    Report Status PENDING  Incomplete  Aerobic/Anaerobic Culture (surgical/deep wound)     Status: None (Preliminary result)   Collection Time: 06/17/19  3:12 PM   Specimen: Intervertebral Disc; Tissue  Result Value Ref Range Status   Specimen Description WOUND  Final   Special Requests L4 L5 DISC ASPIRATION  Final   Gram Stain   Final    NO WBC SEEN NO ORGANISMS SEEN Performed at [facility], [place] [address], [place], [place] [address]    Culture PENDING  Incomplete   Report  Status PENDING  Incomplete     Terri Piedra, NP [place] [facility] [place] Pager  06/18/2019  10:24 AM

## 2019-06-18 NOTE — Progress Notes (Signed)
Attempted to flush IV with normal saline before giving IV pain med, IV was occluded, IV team consult was placed due to patient being difficult IV stick.

## 2019-06-18 NOTE — Progress Notes (Signed)
[facility] Progress Note   Subjective:   Seen in room. Still feeling poorly. Dialysis ended early yesterday d/t CP - resolved. No CP currently.   Objective Vitals:   06/17/19 1510 06/17/19 1709 06/17/19 2041 06/18/19 0514  BP: (!) 154/117 (!) 150/67 (!) 151/70 (!) 151/67  Pulse: 71 61 (!) 48 67  Resp: 18  16 18   Temp:   98.8 F (37.1 C) 99.2 F (37.3 C)  TempSrc:   Oral Oral  SpO2: 96%  94% 99%  Weight:    93.2 kg  Height:       Physical Exam General:Ill appearing man,tearful again. Nasal O2 in place. Heart:RRR; no murmur Lungs:CTA anteriorly Abdomen:soft,mild generalized tenderness without guarding Extremities:No LE edema;1+LUE edema. Wiggling toes on command. Dialysis Access:TDC  Additional Objective Labs: Basic Metabolic Panel: Recent Labs  Lab 06/15/19 0619 06/16/19 0617 06/17/19 0534 06/18/19 0557  NA 132* 130* 128* 133*  K 4.5 4.2 4.6 4.6  CL 92* 91* 90* 95*  CO2 24 24 23 25   GLUCOSE 108* 116* 109* 110*  BUN 38* 59* 77* 55*  CREATININE 7.81* 10.50* 12.29* 9.65*  CALCIUM 9.1 8.7* 8.6* 8.8*  PHOS 5.2* 5.0*  --   --    Liver Function Tests: Recent Labs  Lab 06/15/19 0619 06/16/19 0617  ALBUMIN 2.0* 1.9*   CBC: Recent Labs  Lab 06/14/19 0449 06/15/19 0619 06/16/19 0617 06/17/19 0700 06/18/19 0557  WBC 21.2* 26.1* 27.2* 19.6* 13.3*  HGB 8.0* 8.9* 8.3* 8.1* 8.0*  HCT 24.8* 27.2* 24.8* 24.5* 24.5*  MCV 96.5 95.4 93.2 94.6 95.0  PLT 321 364 375 437* 441*   Blood Culture    Component Value Date/Time   SDES WOUND 06/17/2019 1512   SPECREQUEST L4 L5 DISC ASPIRATION 06/17/2019 1512   CULT PENDING 06/17/2019 1512   REPTSTATUS PENDING 06/17/2019 1512   Studies/Results: Ir Lumbar Disc Aspiration W/img Guide  Result Date: 06/17/2019 INDICATION: Low back pain.  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 No drainable fluid collection/abscess is identified. EXAM: LUMBAR L4-5 DISC  ASPIRATION UNDER FLUOROSCOPY MEDICATIONS: Lidocaine 1% subcutaneous ANESTHESIA/SEDATION: Intravenous Fentanyl 176mcg and Versed 2mg  were administered as conscious sedation during continuous monitoring of the patient's level of consciousness and physiological / cardiorespiratory status by the radiology RN, with a total moderate sedation time of 15 minutes. PROCEDURE: Informed written consent was obtained from the patient after a thorough discussion of the procedural risks, benefits and alternatives. All questions were addressed. Maximal Sterile Barrier Technique was utilized including caps, mask, sterile gowns, sterile gloves, sterile drape, hand hygiene and skin antiseptic. A timeout was performed prior to the initiation of the procedure. The L4-5 interspace was identified under fluoroscopy, corresponding to previous cross-sectional imaging. An appropriate skin entry site was determined. After local infiltration with 1% lidocaine, a 17 gauge trocar needle was advanced into the interspace from left posterolateral extraforaminal approach. Needle tip position within the interspace confirmed on biplane images. Less than 1 mL of very thick opaque viscous debris were aspirated, sent for the requested laboratory studies. The patient tolerated the procedure well. FLUOROSCOPY TIME:  1.7 minutes; [id number] DAP COMPLICATIONS: None immediate. IMPRESSION: Technically successful lumbar L4-5 disc aspiration under fluoroscopy. Electronically Signed   By: Lucrezia Europe M.D.   On: 06/17/2019 16:42   Medications: . ceFEPime (MAXIPIME) IV 2 g (06/17/19 1738)   . amLODipine  5 mg Oral QHS  . aspirin EC  81 mg Oral Daily  . atorvastatin  20  mg Oral q1800  . darbepoetin (ARANESP) injection - DIALYSIS  100 mcg Intravenous Q Sat-HD  . ferric citrate  630 mg Oral TID WC  . hydrALAZINE  100 mg Oral TID  . labetalol  300 mg Oral BID  . losartan  50 mg Oral BID  . minoxidil  2.5 mg Oral BID  . sodium chloride flush  3 mL Intravenous  Q12H  . vancomycin variable dose per unstable renal function (pharmacist dosing)   Does not apply See admin instructions    Dialysis Orders: [facility]  4h 50min 400/1.5 87.5kg 2/2.25 RIJ TDC Hep 3000  - Parsabiv 2.5mg  IV q HD  - Calcitriol 1.89mcg PO q HD   Assessment/ Plan:  1. Fever/Leukocytosis: S/p recent aortic stent and TDC. Possible pneumonia + proctitis on initial CT done at [facility]. BCx negative from [place], repeat 8/23 NGTD. On Vanc/Cefepime.  2. L4-5 Lumbar diskitis: Likely cause of #1. On Vanc/Cefepime. IR aspiration pending. WBC improving. 3. Spinal cord infarct: Per spinal MRI 8/23 after complaining of not being able to move legs. Neuro following. No specific treatment recommended. 4. Hx Type B acute aortic dissection (s/p stent graft repair 8/14): Goal SBP per VVS 120- 170. Known severe resistant HTN. Trying to avoid BP drops on HD. Back on aspirin. 5. ESRD: Continue HD per TTS schedule - next 8/27. 4. Hypertension/volume: Volume improving. BP controlled. 5. Anemia: Hgb 8.0, follow. Aranesp 141mcg weekly. 6. Metabolic bone disease: Ca/Phos ok. Continue Auryxia, Calcitriol. Parsabiv not available inpatient- follow calcium.  7. Nutrition: Renal diet with fluid restrictions recommended  8. Recent MSSA bacteremia/ AVG infection: Hx infected AVG and had revision in 5/20, then partial resection/ revision in 6/20, then all AVG material (x2) removed 6/30. S/p 6 weeks of Ancef IV completed.  Veneta Penton, PA-C 06/18/2019, 9:08 AM  [facility] Pager: [phone]

## 2019-06-18 NOTE — Evaluation (Signed)
Physical Therapy Evaluation Patient Details Name: Frank Fuller MRN: [id number] DOB: [birth year] Today's Date: 06/18/2019   History of Present Illness  Patient is a 58 year old male with history of hypertension, ESRD on dialysis( Tuesday,Thursday,Saturday), abdominal aortic aneurysm status post stent, hepatitis C virus status post treatment, depression, CVA who initially presented to [facility] with multiple complaints.  He was recently hospitalized from 8/11-8/20 for dissection of thoracic aorta(type B) and was treated with endovascular stent graft with spinal drain by Dr. Oneida Alar on 8/14.  He was discharged home but immediately came back with complaints of urge to use the restroom but unable to do so.  He also had fever.  He was found to have paraplegia.  He was suspected to have thoracic ischemia.  CT imaging also showed possible osteomyelitis/discitis of L4-L5.  Also suspected to have sepsis secondary to possible pneumonia. Underwent disc aspiration by IR on 06/18/2019  Clinical Impression   Pt admitted with above diagnosis. Completely independent prior to episode of care approx a week ago; Last week was able to walk 60 ft with RW modified independently; Presents today with significantly decr strength (LE strength decr from 4-5/5 throughout to grossly 2+/5 throughout), decr functional mobility, decr activity tolerance, functional dependency, and pain; Able to sit EOB with bil UE support/prop; Considering he was independent, used his UEs well with bed mobilty, and is motivated, I heartily recommend CIR for post-acute rehabilitation;  Pt currently with functional limitations due to the deficits listed below (see PT Problem List). Pt will benefit from skilled PT to increase their independence and safety with mobility to allow discharge to the venue listed below.       Follow Up Recommendations CIR    Equipment Recommendations  Wheelchair (measurements PT);Wheelchair cushion (measurements PT)(to  be determined)    Recommendations for Other Services Rehab consult     Precautions / Restrictions Precautions Precautions: Fall      Mobility  Bed Mobility Overal bed mobility: Needs Assistance Bed Mobility: Rolling;Sidelying to Sit;Sit to Sidelying Rolling: Min assist Sidelying to sit: Mod assist;+2 for safety/equipment     Sit to sidelying: Mod assist;Max assist General bed mobility comments: HOB elevated and use of bed rail; Cues for technqiue an dmod assist to help LEs off of the bed, heavy mod assist to push up to sit; Tremulous with incr time in sitting; max assist to help LEs back into bed; Rolled to perform hygeine after a BM; min assit mostly to roll with good use of rails; cues to log roll and avoid twisting   Transfers Overall transfer level: Needs assistance Equipment used: 1 person hand held assist(knees blocked and used gait belt) Transfers: Sit to/from Stand Sit to Stand: Max assist         General transfer comment: bilateral support a gait belt and knees blocked; Max asssit; did not get to fully standing, just a squat; attempted helping with hygiene in semi-stand, but unable to hold position long enough  Ambulation/Gait                Stairs            Wheelchair Mobility    Modified Rankin (Stroke Patients Only)       Balance     Sitting balance-Leahy Scale: Poor       Standing balance-Leahy Scale: Zero  Pertinent Vitals/Pain Pain Assessment: 0-10 Pain Score: 8  Pain Location: back, 5/10 before activity and 8/10 after activity Pain Descriptors / Indicators: Aching;Discomfort;Sore Pain Intervention(s): Monitored during session;Patient requesting pain meds-RN notified;Repositioned    Home Living Family/patient expects to be discharged to:: Private residence Living Arrangements: Alone Available Help at Discharge: Family;Available PRN/intermittently Type of Home: House Home Access:  Stairs to enter Entrance Stairs-Rails: Can reach both;Left;Right Entrance Stairs-Number of Steps: 4 Home Layout: One level Home Equipment: Grab bars - tub/shower;Shower seat;Hand held shower head      Prior Function Level of Independence: Independent         Comments: ambulating without AD, driving and independent in ADLs     Hand Dominance   Dominant Hand: Right    Extremity/Trunk Assessment   Upper Extremity Assessment Upper Extremity Assessment: Defer to OT evaluation    Lower Extremity Assessment Lower Extremity Assessment: RLE deficits/detail;LLE deficits/detail RLE Deficits / Details: P/AA/ROM WFL; Decr strength: Hip flexion, abd 2+/5; knee ext 2+/5; ankle df/pf 2/5; tested supine RLE: Unable to fully assess due to pain RLE Coordination: decreased fine motor LLE Deficits / Details: P/AA/ROM WFL; Decr strength: Hip flexion, abd 2+/5; knee ext 2+/5; ankle df/pf 2/5; tested supine LLE: Unable to fully assess due to pain LLE Coordination: decreased fine motor       Communication   Communication: No difficulties  Cognition Arousal/Alertness: Awake/alert Behavior During Therapy: WFL for tasks assessed/performed Overall Cognitive Status: No family/caregiver present to determine baseline cognitive functioning                                        General Comments General comments (skin integrity, edema, etc.): Session conducted on supplemental O2    Exercises     Assessment/Plan    PT Assessment Patient needs continued PT services  PT Problem List Decreased strength;Decreased activity tolerance;Decreased balance;Decreased mobility;Decreased coordination;Decreased knowledge of use of DME;Decreased knowledge of precautions;Impaired sensation;Pain       PT Treatment Interventions DME instruction;Gait training;Functional mobility training;Therapeutic activities;Therapeutic exercise;Balance training;Neuromuscular re-education;Patient/family  education;Wheelchair mobility training    PT Goals (Current goals can be found in the Care Plan section)  Acute Rehab PT Goals Patient Stated Goal: get home PT Goal Formulation: With patient Time For Goal Achievement: 07/02/19 Potential to Achieve Goals: Fair    Frequency Min 3X/week   Barriers to discharge Decreased caregiver support Would like to know how much assist he can rally around himself    Co-evaluation PT/OT/SLP Co-Evaluation/Treatment: Yes Reason for Co-Treatment: Complexity of the patient's impairments (multi-system involvement);Necessary to address cognition/behavior during functional activity;For patient/therapist safety;To address functional/ADL transfers PT goals addressed during session: Mobility/safety with mobility OT goals addressed during session: ADL's and self-care       AM-PAC PT "6 Clicks" Mobility  Outcome Measure Help needed turning from your back to your side while in a flat bed without using bedrails?: A Little Help needed moving from lying on your back to sitting on the side of a flat bed without using bedrails?: A Lot Help needed moving to and from a bed to a chair (including a wheelchair)?: A Lot Help needed standing up from a chair using your arms (e.g., wheelchair or bedside chair)?: Total Help needed to walk in hospital room?: Total Help needed climbing 3-5 steps with a railing? : Total 6 Click Score: 10    End of Session Equipment Utilized During Treatment: Gait  belt Activity Tolerance: Patient tolerated treatment well Patient left: in bed;with call bell/phone within reach;with bed alarm set;Other (comment)(bed in semi-chair position) Nurse Communication: Mobility status PT Visit Diagnosis: Unsteadiness on feet (R26.81);Other abnormalities of gait and mobility (R26.89);Muscle weakness (generalized) (M62.81);Other (comment)(paraplegia)    Time: [id number] PT Time Calculation (min) (ACUTE ONLY): 40 min   Charges:   PT Evaluation $PT Eval  Moderate Complexity: 1 Mod          [name], [place]  Acute Rehabilitation Services Pager [phone] Office [phone]   [name] 06/18/2019, 12:56 PM

## 2019-06-18 NOTE — Progress Notes (Signed)
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  PT/OT  No further fever WBC decreasing continue antibiotics per ID Disc culture pending   Ruta Hinds 06/18/2019 8:30 AM --  Laboratory Lab Results: Recent Labs    06/17/19 0700 06/18/19 0557  WBC 19.6* 13.3*  HGB 8.1* 8.0*  HCT 24.5* 24.5*  PLT 437* 441*   BMET Recent Labs    06/17/19 0534 06/18/19 0557  NA 128* 133*  K 4.6 4.6  CL 90* 95*  CO2 23 25  GLUCOSE 109* 110*  BUN 77* 55*  CREATININE 12.29* 9.65*  CALCIUM 8.6* 8.8*    COAG Lab Results  Component Value Date   INR 1.3 (H) 06/17/2019   INR 1.3 (H) 06/05/2019   INR 1.3 (H) 06/03/2019   No results found for: PTT

## 2019-06-19 LAB — CBC WITH DIFFERENTIAL/PLATELET
Abs Immature Granulocytes: 0.4 10*3/uL — ABNORMAL HIGH (ref 0.00–0.07)
Basophils Absolute: 0 10*3/uL (ref 0.0–0.1)
Basophils Relative: 0 %
Eosinophils Absolute: 0.3 10*3/uL (ref 0.0–0.5)
Eosinophils Relative: 2 %
HCT: 24.7 % — ABNORMAL LOW (ref 39.0–52.0)
Hemoglobin: 8 g/dL — ABNORMAL LOW (ref 13.0–17.0)
Immature Granulocytes: 3 %
Lymphocytes Relative: 6 %
Lymphs Abs: 0.8 10*3/uL (ref 0.7–4.0)
MCH: 31 pg (ref 26.0–34.0)
MCHC: 32.4 g/dL (ref 30.0–36.0)
MCV: 95.7 fL (ref 80.0–100.0)
Monocytes Absolute: 2.9 10*3/uL — ABNORMAL HIGH (ref 0.1–1.0)
Monocytes Relative: 21 %
Neutro Abs: 8.9 10*3/uL — ABNORMAL HIGH (ref 1.7–7.7)
Neutrophils Relative %: 68 %
Platelets: 454 10*3/uL — ABNORMAL HIGH (ref 150–400)
RBC: 2.58 MIL/uL — ABNORMAL LOW (ref 4.22–5.81)
RDW: 17.3 % — ABNORMAL HIGH (ref 11.5–15.5)
WBC: 13.3 10*3/uL — ABNORMAL HIGH (ref 4.0–10.5)
nRBC: 0 % (ref 0.0–0.2)

## 2019-06-19 LAB — BASIC METABOLIC PANEL
Anion gap: 16 — ABNORMAL HIGH (ref 5–15)
BUN: 72 mg/dL — ABNORMAL HIGH (ref 6–20)
CO2: 22 mmol/L (ref 22–32)
Calcium: 9.1 mg/dL (ref 8.9–10.3)
Chloride: 92 mmol/L — ABNORMAL LOW (ref 98–111)
Creatinine, Ser: 11.28 mg/dL — ABNORMAL HIGH (ref 0.61–1.24)
GFR calc Af Amer: 5 mL/min — ABNORMAL LOW (ref >60)
GFR calc non Af Amer: 4 mL/min — ABNORMAL LOW (ref >60)
Glucose, Bld: 102 mg/dL — ABNORMAL HIGH (ref 70–99)
Potassium: 4.5 mmol/L (ref 3.5–5.1)
Sodium: 130 mmol/L — ABNORMAL LOW (ref 135–145)

## 2019-06-19 LAB — PHOSPHORUS: Phosphorus: 4.9 mg/dL — ABNORMAL HIGH (ref 2.5–4.6)

## 2019-06-19 MED ORDER — HEPARIN SODIUM (PORCINE) 1000 UNIT/ML IJ SOLN
INTRAMUSCULAR | Status: AC
  Administered 2019-06-19: 3400 [IU]
  Filled 2019-06-19: qty 4

## 2019-06-19 MED ORDER — OXYCODONE HCL 5 MG PO TABS
ORAL_TABLET | ORAL | Status: AC
  Filled 2019-06-19: qty 1

## 2019-06-19 NOTE — Progress Notes (Signed)
[place] [facility]  Date of Admission:  06/13/2019     Total days of antibiotics 7         ASSESSMENT/PLAN  Mr. Shroff continues to have back pain as well as low back pain and paraparesis status post recent aortic dissection procedure complicated by spinal cord infarction and concern for lumbar discitis.  Blood cultures remain without growth to date and aspiration cultures are without growth in less than 24 hours.  He has remained afebrile and any residual pneumonia appears to have cleared.  Continue current dose of vancomycin and cefepime with dialysis.  Goal remains to treat for 6 weeks of therapy with dialysis.  Will work to Art therapist of treatment.   1.  Continue current dose of vancomycin and cefepime. 2.  Continue to monitor aspiration cultures with narrowing of antibiotics as able. 3.  Paraparesis per primary team and neurology.   Principal Problem:   Lumbar discitis Active Problems:   ESRD on hemodialysis ([place])   Ileus (HCC)   Proctitis   H/O endovascular stent graft for abdominal aortic aneurysm   . amLODipine  5 mg Oral QHS  . aspirin EC  81 mg Oral Daily  . atorvastatin  20 mg Oral q1800  . darbepoetin (ARANESP) injection - DIALYSIS  100 mcg Intravenous Q Sat-HD  . ferric citrate  630 mg Oral TID WC  . hydrALAZINE  100 mg Oral TID  . labetalol  300 mg Oral BID  . losartan  50 mg Oral BID  . minoxidil  2.5 mg Oral BID  . polyethylene glycol  17 g Oral Daily  . sodium chloride flush  3 mL Intravenous Q12H  . vancomycin variable dose per unstable renal function (pharmacist dosing)   Does not apply See admin instructions    SUBJECTIVE:  Afebrile overnight with no acute events.  Continues to have back and lower extremity pain.  Seen in dialysis.  Allergies  Allergen Reactions  . Oxycodone Nausea Only  . Chlorhexidine Other (See Comments)    Unknown reaction Patch skin test done at dialysis 06/26/17  - staff using clear dressing and  alcohol to clean exit site of catheter  . Clonidine Derivatives Other (See Comments)    Dizziness   . Lisinopril Other (See Comments)    unresponsive  . Carvedilol Rash     Review of Systems: Review of Systems  Constitutional: Negative for chills, fever and weight loss.  Respiratory: Negative for cough, shortness of breath and wheezing.   Cardiovascular: Negative for chest pain and leg swelling.  Gastrointestinal: Negative for abdominal pain, constipation, diarrhea, nausea and vomiting.  Musculoskeletal: Positive for back pain.  Skin: Negative for rash.  Neurological: Positive for weakness.      OBJECTIVE: Vitals:   06/19/19 1030 06/19/19 1045 06/19/19 1100 06/19/19 1130  BP: 128/68 110/71 (!) 164/86 (!) 145/83  Pulse: 78 82 81 84  Resp: 16 16 19    Temp:      TempSrc:      SpO2: 93% 92% 96%   Weight:      Height:       Body mass index is 25.17 kg/m.  Physical Exam Constitutional:      General: He is not in acute distress.    Appearance: He is well-developed.     Comments: Lying in bed with head of bed elevated  Cardiovascular:     Rate and Rhythm: Normal rate and regular rhythm.     Heart sounds: Normal  heart sounds.  Pulmonary:     Effort: Pulmonary effort is normal.     Breath sounds: Normal breath sounds. No wheezing, rhonchi or rales.  Chest:     Chest wall: No tenderness.  Abdominal:     General: Bowel sounds are normal. There is no distension.     Tenderness: There is no abdominal tenderness. There is no rebound.  Skin:    General: Skin is warm and dry.  Neurological:     Mental Status: He is alert and oriented to person, place, and time.  Psychiatric:        Mood and Affect: Mood normal.     Lab Results Lab Results  Component Value Date   WBC 13.3 (H) 06/19/2019   HGB 8.0 (L) 06/19/2019   HCT 24.7 (L) 06/19/2019   MCV 95.7 06/19/2019   PLT 454 (H) 06/19/2019    Lab Results  Component Value Date   CREATININE 11.28 (H) 06/19/2019   BUN 72  (H) 06/19/2019   NA 130 (L) 06/19/2019   K 4.5 06/19/2019   CL 92 (L) 06/19/2019   CO2 22 06/19/2019    Lab Results  Component Value Date   ALT <5 05/27/2019   AST 12 (L) 05/27/2019   ALKPHOS 55 05/27/2019   BILITOT 0.5 05/27/2019     Microbiology: Recent Results (from the past 240 hour(s))  Culture, blood (routine x 2)     Status: None (Preliminary result)   Collection Time: 06/15/19 11:20 AM   Specimen: BLOOD LEFT HAND  Result Value Ref Range Status   Specimen Description BLOOD LEFT HAND  Final   Special Requests   Final    BOTTLES DRAWN AEROBIC ONLY Blood Culture results may not be optimal due to an inadequate volume of blood received in culture bottles   Culture   Final    NO GROWTH 4 DAYS Performed at [facility], [place] [address] [place], [place] [address]    Report Status PENDING  Incomplete  Culture, blood (routine x 2)     Status: None (Preliminary result)   Collection Time: 06/15/19 11:28 AM   Specimen: BLOOD  Result Value Ref Range Status   Specimen Description BLOOD LEFT ANTECUBITAL  Final   Special Requests   Final    BOTTLES DRAWN AEROBIC ONLY Blood Culture results may not be optimal due to an inadequate volume of blood received in culture bottles   Culture   Final    NO GROWTH 4 DAYS Performed at [facility], [place] [address], [place], [place] [address]    Report Status PENDING  Incomplete  Aerobic/Anaerobic Culture (surgical/deep wound)     Status: None (Preliminary result)   Collection Time: 06/17/19  3:12 PM   Specimen: Intervertebral Disc; Tissue  Result Value Ref Range Status   Specimen Description WOUND  Final   Special Requests L4 L5 DISC ASPIRATION  Final   Gram Stain NO WBC SEEN NO ORGANISMS SEEN   Final   Culture   Final    NO GROWTH < 24 HOURS Performed at [facility], [place] [address], [place],  [address]    Report Status PENDING  Incomplete     Terri Piedra, NP [facility] [phone] Pager  06/19/2019  11:41 AM

## 2019-06-19 NOTE — Progress Notes (Signed)
Pharmacy Antibiotic Note  Frank Fuller is a 58 y.o. male admitted on 06/13/2019 with lumbar discitis.  Pharmacy has been consulted for vancomycin dosing.  Today is day 7 of cefepime and vancomycin. His random vancomycin level on 8/26 is 34. Frank Fuller had a full HD session today at 400 BFR for 4 hours. With estimated 11% of vancomycin removed per hour, his vancomycin level is estimated to be at 19 now, which is near our goal of 15. Patient will likely undergo HD again on Saturday as he is on a TTS schedule. Pharmacy will continue to monitor HD schedule and will plan for a post-HD vancomycin dose of 1000 mg if the patient has another session.   Of note, ID is planning to continue both cefepime and vancomycin for 6 weeks of total therapy.   Plan: Hold vancomycin level until after patient has had a hemodialysis session   Height: 6\' 4"  (193 cm) Weight: 203 lb 0.7 oz (92.1 kg) IBW/kg (Calculated) : 86.8  Temp (24hrs), Avg:98.7 F (37.1 C), Min:98.2 F (36.8 C), Max:99.9 F (37.7 C)  Recent Labs  Lab 06/15/19 0619 06/16/19 0617 06/17/19 0534 06/17/19 0700 06/18/19 0557 06/19/19 0448  WBC 26.1* 27.2*  --  19.6* 13.3* 13.3*  CREATININE 7.81* 10.50* 12.29*  --  9.65* 11.28*  VANCORANDOM  --   --   --   --  34  --     Estimated Creatinine Clearance: 8.6 mL/min (A) (by C-G formula based on SCr of 11.28 mg/dL (H)).    Allergies  Allergen Reactions  . Oxycodone Nausea Only  . Chlorhexidine Other (See Comments)    Unknown reaction Patch skin test done at dialysis 06/26/17  - staff using clear dressing and alcohol to clean exit site of catheter  . Clonidine Derivatives Other (See Comments)    Dizziness   . Lisinopril Other (See Comments)    unresponsive  . Carvedilol Rash    Antimicrobials this admission: Vanc 8/21 >> Cefepime 8/21 >>  Microbiology results: 8/23 BCx >> ngtd 8/25 IR disc aspirate >>NGTD 8/25 fluid culture sent.  Thank you for allowing pharmacy to be a part of  this patient's care.  Eddie Candle, PharmD PGY-1 Pharmacy Resident  06/19/2019 1:21 PM

## 2019-06-19 NOTE — Progress Notes (Signed)
PROGRESS NOTE    Frank Fuller  [id number] DOB: April 30, 1961 DOA: 06/13/2019 PCP: [facility]   Brief Narrative:  Patient is a 58 year old male with history of hypertension, ESRD on dialysis( Tuesday,Thursday,Saturday), abdominal aortic aneurysm status post stent, hepatitis C virus status post treatment, depression, CVA who initially presented to [facility] with multiple complaints.  He was recently hospitalized from 8/11-8/20 for dissection of thoracic aorta(type B) and was treated with endovascular stent graft with spinal drain by Dr. Oneida Alar on 8/14.  He was discharged home but immediately came back with complaints of urge to use the restroom but unable to do so.  He also had fever.  He was found to have paraplegia.  He was suspected to have thoracic spinal ischemia.  CT imaging also showed possible osteomyelitis/discitis of L4-L5.  Also suspected to have sepsis secondary to possible pneumonia. Underwent disc aspiration by IR on 06/18/2019.  Neurosurgery, vascular surgery, ID following.  Assessment & Plan:   Principal Problem:   Lumbar discitis Active Problems:   ESRD on hemodialysis (HCC)   Ileus (HCC)   Proctitis   H/O endovascular stent graft for abdominal aortic aneurysm   Suspected sepsis secondary to pneumonia discitis/osteomyelitis: Presented with fever of 102F, leukocytosis.  CRP/procalcitonin was elevated.  CT also showed possible right-sided pneumonia with Iileus and proctitis.  Cultures from [facility] did not show any growth.  Cultures sent here has not shown any growth yet.  Started on broad-spectrum antibiotics with vancomycin and cefepime.  Currently is afebrile.  Leukocytosis improving. MRI lumbar spine showed new space-occupying material in the lower retroperitoneum and lumbosacral junction.  Favored acute discitis and osteomyelitis at L4-L5 with pervertebral phlegmon.  Neurosurgery , interventional radiology consulted .S/P disc aspiration.Infectious  disease following and recommending 6 weeks of antibiotics with dialysis.  Cultures have remained negative.  Hypoxia: Noted to be hypoxic with desaturation on room air.  Continue supplemental oxygen.  CT had shown left pleural effusion, proBNP was elevated.  Continue volume control with dialysis.Last chest x-ray shows left base atelectasis.  We will try to taper off oxygen.  Abdomen pain/acute ileus/proctitis: Noted on CT imaging at outside hospital showing signs of ileus with large stool burden and inflammation of rectum.  He was also complaining of nausea.  He said he had a bowel movement today. Denies any abdominal pain. Tolerating diet. No obstruction as per last abdominal x-ray.  ESRD on dialysis:ESRD on dialysis( Tuesday,Thursday,Saturday).  Nephrology following.  Dialyzed today.  Anemia of chronic disease: Associated with ESRD.  Currently CBC stable.  Continue to monitor.  Left upper extremity swelling: Doppler ultrasound did not show any DVT.  Continue to monitor  Thoracic aortic dissection: Status post endovascular stent placement.  Vascular surgery was following  Hypertension: Currently blood pressure stable.  Continue current medicines .Continue to monitor blood pressure.  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  Abnormal spinal cord at T9-T10, T10-11, possible segmental cord infarct.  Neurosurgery consulted but signed off.  No plan for intervention. Recommended daily baby aspirin. CIR recommended after  PT/OT evaluation.        DVT prophylaxis: SCD Code Status: Full Family Communication: None present at the bedside Disposition Plan: CIR after clearance for ID on IV antibiotics  Consultants: Nephrology, ID, vascular surgery  Procedures: Dialysis, disc aspiration Antimicrobials:  Anti-infectives (From admission, onward)    Start     Dose/Rate Route Frequency Ordered Stop  06/18/19 0808  vancomycin variable dose per unstable renal function (pharmacist dosing)      Does not apply See admin instructions 06/18/19 0808     06/14/19 1800  ceFEPIme (MAXIPIME) 2 g in sodium chloride 0.9 % 100 mL IVPB     2 g 200 mL/hr over 30 Minutes Intravenous Every T-Th-Sa (1800) 06/13/19 1540     06/14/19 1200  vancomycin (VANCOCIN) IVPB 1000 mg/200 mL premix  Status:  Discontinued     1,000 mg 200 mL/hr over 60 Minutes Intravenous Every T-Th-Sa (Hemodialysis) 06/13/19 1540 06/18/19 0808   06/13/19 1545  vancomycin (VANCOCIN) 2,000 mg in sodium chloride 0.9 % 500 mL IVPB     2,000 mg 250 mL/hr over 120 Minutes Intravenous  Once 06/13/19 1519 06/13/19 1829   06/13/19 1545  ceFEPIme (MAXIPIME) 2 g in sodium chloride 0.9 % 100 mL IVPB     2 g 200 mL/hr over 30 Minutes Intravenous  Once 06/13/19 1519 06/13/19 2241      Subjective:  Patient seen and examined the bedside this morning.  Currently comfortable.  Hemodynamically stable.  Denies any abdominal pain today.  Complains of some shortness of breath but respiratory status has improved since yesterday.  Feels much better today.  Objective: Vitals:   06/19/19 1100 06/19/19 1130 06/19/19 1140 06/19/19 1212  BP: (!) 164/86 (!) 145/83 (!) 158/84 (!) 158/83  Pulse: 81 84 80 90  Resp: 19  19   Temp:   98.2 F (36.8 C) 99 F (37.2 C)  TempSrc:   Oral Oral  SpO2: 96%  96% 92%  Weight:   92.1 kg   Height:        Intake/Output Summary (Last 24 hours) at 06/19/2019 1339 Last data filed at 06/19/2019 1140 Gross per 24 hour  Intake 60 ml  Output 2050 ml  Net -1990 ml   Filed Weights   06/18/19 2114 06/19/19 0730 06/19/19 1140  Weight: 93.3 kg 93.8 kg 92.1 kg    Examination:  General exam: Not in distress,average built HEENT:PERRL,Oral mucosa moist, Ear/Nose normal on gross exam Respiratory system: Decreased air entry on the left lower base Cardiovascular system: S1  & S2 heard, RRR. No JVD, murmurs, rubs, gallops or clicks.  Temporary dialysis catheter on the right chest Gastrointestinal system: Abdomen is nondistended, soft and nontender. No organomegaly or masses felt. Normal bowel sounds heard. Central nervous system: Alert and oriented. No focal neurological deficits. Extremities: No edema, no clubbing ,no cyanosis, distal peripheral pulses palpable. Skin: No rashes, lesions or ulcers,no icterus ,no pallor MSK: Normal muscle bulk,tone ,power Psychiatry: Judgement and insight appear normal. Mood & affect appropriate.     Data Reviewed: I have personally reviewed following labs and imaging studies  CBC: Recent Labs  Lab 06/15/19 0619 06/16/19 0617 06/17/19 0700 06/18/19 0557 06/19/19 0448  WBC 26.1* 27.2* 19.6* 13.3* 13.3*  NEUTROABS  --   --   --   --  8.9*  HGB 8.9* 8.3* 8.1* 8.0* 8.0*  HCT 27.2* 24.8* 24.5* 24.5* 24.7*  MCV 95.4 93.2 94.6 95.0 95.7  PLT 364 375 437* 441* [id number]*   Basic Metabolic Panel: Recent Labs  Lab 06/15/19 0619 06/16/19 0617 06/17/19 0534 06/18/19 0557 06/19/19 0448  NA 132* 130* 128* 133* 130*  K 4.5 4.2 4.6 4.6 4.5  CL 92* 91* 90* 95* 92*  CO2 24 24 23 25 22   GLUCOSE 108* 116* 109* 110* 102*  BUN 38* 59* 77* 55* 72*  CREATININE 7.81* 10.50* 12.29*  9.65* 11.28*  CALCIUM 9.1 8.7* 8.6* 8.8* 9.1  PHOS 5.2* 5.0*  --   --  4.9*   GFR: Estimated Creatinine Clearance: 8.6 mL/min (A) (by C-G formula based on SCr of 11.28 mg/dL (H)). Liver Function Tests: Recent Labs  Lab 06/15/19 0619 06/16/19 0617  ALBUMIN 2.0* 1.9*   No results for input(s): LIPASE, AMYLASE in the last 168 hours. No results for input(s): AMMONIA in the last 168 hours. Coagulation Profile: Recent Labs  Lab 06/17/19 0534  INR 1.3*   Cardiac Enzymes: No results for input(s): CKTOTAL, CKMB, CKMBINDEX, TROPONINI in the last 168 hours. BNP (last 3 results) No results for input(s): PROBNP in the last 8760 hours. HbA1C: No results  for input(s): HGBA1C in the last 72 hours. CBG: Recent Labs  Lab 06/15/19 1708 06/15/19 2119 06/15/19 2358 06/16/19 0353 06/16/19 0733  GLUCAP 109* 127* 117* 129* 116*   Lipid Profile: No results for input(s): CHOL, HDL, LDLCALC, TRIG, CHOLHDL, LDLDIRECT in the last 72 hours. Thyroid Function Tests: No results for input(s): TSH, T4TOTAL, FREET4, T3FREE, THYROIDAB in the last 72 hours. Anemia Panel: No results for input(s): VITAMINB12, FOLATE, FERRITIN, TIBC, IRON, RETICCTPCT in the last 72 hours. Sepsis Labs: No results for input(s): PROCALCITON, LATICACIDVEN in the last 168 hours.  Recent Results (from the past 240 hour(s))  Culture, blood (routine x 2)     Status: None (Preliminary result)   Collection Time: 06/15/19 11:20 AM   Specimen: BLOOD LEFT HAND  Result Value Ref Range Status   Specimen Description BLOOD LEFT HAND  Final   Special Requests   Final    BOTTLES DRAWN AEROBIC ONLY Blood Culture results may not be optimal due to an inadequate volume of blood received in culture bottles   Culture   Final    NO GROWTH 4 DAYS Performed at [facility], [place] [address], [place], [place] [address]    Report Status PENDING  Incomplete  Culture, blood (routine x 2)     Status: None (Preliminary result)   Collection Time: 06/15/19 11:28 AM   Specimen: BLOOD  Result Value Ref Range Status   Specimen Description BLOOD LEFT ANTECUBITAL  Final   Special Requests   Final    BOTTLES DRAWN AEROBIC ONLY Blood Culture results may not be optimal due to an inadequate volume of blood received in culture bottles   Culture   Final    NO GROWTH 4 DAYS Performed at [facility], [place] [address] [place], [place] [address]    Report Status PENDING  Incomplete  Aerobic/Anaerobic Culture (surgical/deep wound)     Status: None (Preliminary result)   Collection Time: 06/17/19  3:12 PM   Specimen: Intervertebral Disc; Tissue  Result Value Ref Range Status   Specimen Description  WOUND  Final   Special Requests L4 L5 DISC ASPIRATION  Final   Gram Stain NO WBC SEEN NO ORGANISMS SEEN   Final   Culture   Final    NO GROWTH 2 DAYS NO ANAEROBES ISOLATED; CULTURE IN PROGRESS FOR 5 DAYS Performed at [facility], [address] [place], [place] [address]    Report Status PENDING  Incomplete         Radiology Studies: Dg Chest 1 View  Result Date: 06/18/2019 CLINICAL DATA:  Shortness of breath, cough. EXAM: CHEST  1 VIEW COMPARISON:  Radiograph of June 15, 2019. FINDINGS: Stable cardiomediastinal silhouette. Status post stent graft repair of descending thoracic aorta. No pneumothorax  or pleural effusion is noted. Right internal jugular catheter is unchanged in position. Minimal right basilar subsegmental atelectasis is noted. Increased left basilar opacity is noted concerning for worsening atelectasis. Bony thorax is unremarkable. IMPRESSION: Increased left basilar opacity is noted concerning for worsening atelectasis. Minimal right basilar subsegmental atelectasis is noted. Electronically Signed   By: Marijo Conception M.D.   On: 06/18/2019 10:56   Dg Abd 1 View  Result Date: 06/18/2019 CLINICAL DATA:  Abdominal pain. EXAM: ABDOMEN - 1 VIEW COMPARISON:  Radiographs of June 12, 2019. FINDINGS: Stable large amount of residual contrast and stool is seen throughout the colon. No significant small bowel dilatation is noted. No abnormal calcifications are noted. IMPRESSION: Stable large amount of residual contrast and stool is seen throughout the colon. No abnormal bowel dilatation is noted. Electronically Signed   By: Marijo Conception M.D.   On: 06/18/2019 10:59   Ir Lumbar Disc Aspiration W/img Guide  Result Date: 06/17/2019 INDICATION: Low back pain.  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 No drainable fluid collection/abscess is identified. EXAM: LUMBAR L4-5 DISC ASPIRATION UNDER FLUOROSCOPY MEDICATIONS: Lidocaine 1%  subcutaneous ANESTHESIA/SEDATION: Intravenous Fentanyl 191mcg and Versed 2mg  were administered as conscious sedation during continuous monitoring of the patient's level of consciousness and physiological / cardiorespiratory status by the radiology RN, with a total moderate sedation time of 15 minutes. PROCEDURE: Informed written consent was obtained from the patient after a thorough discussion of the procedural risks, benefits and alternatives. All questions were addressed. Maximal Sterile Barrier Technique was utilized including caps, mask, sterile gowns, sterile gloves, sterile drape, hand hygiene and skin antiseptic. A timeout was performed prior to the initiation of the procedure. The L4-5 interspace was identified under fluoroscopy, corresponding to previous cross-sectional imaging. An appropriate skin entry site was determined. After local infiltration with 1% lidocaine, a 17 gauge trocar needle was advanced into the interspace from left posterolateral extraforaminal approach. Needle tip position within the interspace confirmed on biplane images. Less than 1 mL of very thick opaque viscous debris were aspirated, sent for the requested laboratory studies. The patient tolerated the procedure well. FLUOROSCOPY TIME:  1.7 minutes; [id number] DAP COMPLICATIONS: None immediate. IMPRESSION: Technically successful lumbar L4-5 disc aspiration under fluoroscopy. Electronically Signed   By: Lucrezia Europe M.D.   On: 06/17/2019 16:42        Scheduled Meds: . amLODipine  5 mg Oral QHS  . aspirin EC  81 mg Oral Daily  . atorvastatin  20 mg Oral q1800  . darbepoetin (ARANESP) injection - DIALYSIS  100 mcg Intravenous Q Sat-HD  . ferric citrate  630 mg Oral TID WC  . hydrALAZINE  100 mg Oral TID  . labetalol  300 mg Oral BID  . losartan  50 mg Oral BID  . minoxidil  2.5 mg Oral BID  . polyethylene glycol  17 g Oral Daily  . sodium chloride flush  3 mL Intravenous Q12H  . vancomycin variable dose per unstable  renal function (pharmacist dosing)   Does not apply See admin instructions   Continuous Infusions: . ceFEPime (MAXIPIME) IV 2 g (06/17/19 1738)     LOS: 6 days    Time spent:35 mins. More than 50% of that time was spent in counseling and/or coordination of care.      Shelly Coss, MD [facility] Pager [phone]  If 7PM-7AM, please contact night-coverage [email or web address] Password [email or web address] 06/19/2019, 1:39 PM

## 2019-06-19 NOTE — Plan of Care (Signed)
  Problem: Education: Goal: Knowledge of General Education information will improve Description Including pain rating scale, medication(s)/side effects and non-pharmacologic comfort measures Outcome: Progressing   

## 2019-06-19 NOTE — Progress Notes (Signed)
Inpatient Rehabilitation Admissions Coordinator  Met with patient at bedside. He states he is in too much pain to discuss rehab. I will follow. I am unable to determine rehab venue until I have detailed discussion with patient of his rehab needs, caregiver support and patient able to tolerate up into chair for the intensity of rehab required.  Danne Baxter, RN, MSN Rehab Admissions Coordinator [phone] 06/19/2019 1:39 PM

## 2019-06-19 NOTE — Procedures (Signed)
I was present at this dialysis session. I have reviewed the session itself and made appropriate changes.   2K bath.  BP stable.  FUrther inc in leg strength. Possibly to CIR.  TDC. Pt w/o co or concerns.   Micro data all NGTD  Filed Weights   06/18/19 0514 06/18/19 2114 06/19/19 0730  Weight: 93.2 kg 93.3 kg 93.8 kg    Recent Labs  Lab 06/19/19 0448  NA 130*  K 4.5  CL 92*  CO2 22  GLUCOSE 102*  BUN 72*  CREATININE 11.28*  CALCIUM 9.1  PHOS 4.9*    Recent Labs  Lab 06/17/19 0700 06/18/19 0557 06/19/19 0448  WBC 19.6* 13.3* 13.3*  NEUTROABS  --   --  8.9*  HGB 8.1* 8.0* 8.0*  HCT 24.5* 24.5* 24.7*  MCV 94.6 95.0 95.7  PLT 437* 441* 454*    Scheduled Meds: . amLODipine  5 mg Oral QHS  . aspirin EC  81 mg Oral Daily  . atorvastatin  20 mg Oral q1800  . darbepoetin (ARANESP) injection - DIALYSIS  100 mcg Intravenous Q Sat-HD  . ferric citrate  630 mg Oral TID WC  . hydrALAZINE  100 mg Oral TID  . labetalol  300 mg Oral BID  . losartan  50 mg Oral BID  . minoxidil  2.5 mg Oral BID  . polyethylene glycol  17 g Oral Daily  . sodium chloride flush  3 mL Intravenous Q12H  . vancomycin variable dose per unstable renal function (pharmacist dosing)   Does not apply See admin instructions   Continuous Infusions: . ceFEPime (MAXIPIME) IV 2 g (06/17/19 1738)   PRN Meds:.acetaminophen **OR** acetaminophen, albuterol, fentaNYL (SUBLIMAZE) injection, ferric citrate, hydrALAZINE, ondansetron **OR** ondansetron (ZOFRAN) IV, oxyCODONE   [name]  MD 06/19/2019, 8:23 AM

## 2019-06-20 ENCOUNTER — Ambulatory Visit: Payer: Medicare Other | Admitting: Vascular Surgery

## 2019-06-20 LAB — CULTURE, BLOOD (ROUTINE X 2)
Culture: NO GROWTH
Culture: NO GROWTH

## 2019-06-20 MED ORDER — VANCOMYCIN HCL IN DEXTROSE 1-5 GM/200ML-% IV SOLN
1000.0000 mg | INTRAVENOUS | Status: DC
  Administered 2019-06-21 – 2019-06-24 (×2): 1000 mg via INTRAVENOUS
  Filled 2019-06-20 (×3): qty 200

## 2019-06-20 NOTE — Progress Notes (Signed)
PROGRESS NOTE    Frank Fuller  [id number] DOB: [birth year] DOA: 06/13/2019 PCP: [facility]   Brief Narrative:  Patient is a 58 year old male with history of hypertension, ESRD on dialysis( Tuesday,Thursday,Saturday), abdominal aortic aneurysm status post stent, hepatitis C virus status post treatment, depression, CVA who initially presented to [facility] with multiple complaints.  He was recently hospitalized from 8/11-8/20 for dissection of thoracic aorta(type B) and was treated with endovascular stent graft with spinal drain by Dr. Oneida Alar on 8/14.  He was discharged home but immediately came back with complaints of urge to use the restroom but unable to do so.  He also had fever.  He was found to have paraplegia.  He was suspected to have thoracic spinal ischemia.  CT imaging also showed possible osteomyelitis/discitis of L4-L5.  Also suspected to have sepsis secondary to possible pneumonia. Underwent disc aspiration by IR on 06/18/2019.  Neurosurgery, vascular surgery, ID following. Plan for discharge to CIR after ID clearance.  Assessment & Plan:   Principal Problem:   Lumbar discitis Active Problems:   ESRD on hemodialysis (HCC)   Ileus (HCC)   Proctitis   H/O endovascular stent graft for abdominal aortic aneurysm   Suspected sepsis secondary to pneumonia discitis/osteomyelitis: Presented with fever of 102F, leukocytosis.  CRP/procalcitonin was elevated.  CT also showed possible right-sided pneumonia with Iileus and proctitis.  Cultures from [facility] did not show any growth.  Cultures sent here has not shown any growth yet.  Started on broad-spectrum antibiotics with vancomycin and cefepime.  Currently is afebrile.  Leukocytosis improving. MRI lumbar spine showed new space-occupying material in the lower retroperitoneum and lumbosacral junction.  Favored acute discitis and osteomyelitis at L4-L5 with pervertebral phlegmon.  Neurosurgery , interventional  radiology consulted .S/P disc aspiration.Culture negative so far. Infectious disease following and recommending 6 weeks of antibiotics with dialysis.  Cultures have remained negative.Will wait for final ID recommendation.  Hypoxia:On presentation he was  noted to be hypoxic with desaturation on room air.Pneumonia suspected.  Continue supplemental oxygen.  CT had shown left pleural effusion, proBNP was elevated.  Continue volume control with dialysis.Last chest x-ray shows left base atelectasis.  We will try to taper off oxygen.  Abdomen pain/acute ileus/proctitis: Noted on CT imaging at outside hospital showing signs of ileus with large stool burden and inflammation of rectum.  He said he had a bowel movement today. Denies any abdominal pain. Tolerating diet. No obstruction as per last abdominal x-ray.  ESRD on dialysis:ESRD on dialysis( Tuesday,Thursday,Saturday).  Nephrology following.  Next dialysis tomorrow.  Anemia of chronic disease: Associated with ESRD.  Currently CBC stable.  Continue to monitor.  Left upper extremity swelling: Doppler ultrasound did not show any DVT.  Continue to monitor  Thoracic aortic dissection: Status post endovascular stent placement.  Vascular surgery was following  Hypertension: Currently blood pressure stable.  Continue current medicines .Continue to monitor blood pressure.  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  Abnormal spinal cord at T9-T10, T10-11, possible segmental cord infarct.  Neurosurgery consulted and  signed off.  No plan for intervention. Recommended daily baby aspirin by neurology. CIR recommended after  PT/OT evaluation.        DVT prophylaxis: SCD Code Status: Full Family Communication: None present at the bedside Disposition Plan: CIR after clearance for ID on IV antibiotics   Consultants: Nephrology, ID, vascular surgery  Procedures: Dialysis, disc  aspiration Antimicrobials:  Anti-infectives (From admission, onward)   Start     Dose/Rate Route Frequency Ordered Stop   06/21/19 1200  vancomycin (VANCOCIN) IVPB 1000 mg/200 mL premix     1,000 mg 200 mL/hr over 60 Minutes Intravenous Every T-Th-Sa (Hemodialysis) 06/20/19 0800     06/18/19 0808  vancomycin variable dose per unstable renal function (pharmacist dosing)  Status:  Discontinued      Does not apply See admin instructions 06/18/19 0808 06/20/19 0805   06/14/19 1800  ceFEPIme (MAXIPIME) 2 g in sodium chloride 0.9 % 100 mL IVPB     2 g 200 mL/hr over 30 Minutes Intravenous Every T-Th-Sa (1800) 06/13/19 1540     06/14/19 1200  vancomycin (VANCOCIN) IVPB 1000 mg/200 mL premix  Status:  Discontinued     1,000 mg 200 mL/hr over 60 Minutes Intravenous Every T-Th-Sa (Hemodialysis) 06/13/19 1540 06/18/19 0808   06/13/19 1545  vancomycin (VANCOCIN) 2,000 mg in sodium chloride 0.9 % 500 mL IVPB     2,000 mg 250 mL/hr over 120 Minutes Intravenous  Once 06/13/19 1519 06/13/19 1829   06/13/19 1545  ceFEPIme (MAXIPIME) 2 g in sodium chloride 0.9 % 100 mL IVPB     2 g 200 mL/hr over 30 Minutes Intravenous  Once 06/13/19 1519 06/13/19 2241      Subjective:  Patient seen and examined the bedside this morning.  Was sleeping.  Denies any abdomen pain, nausea or vomiting.  His bilateral lower extremity strength have been improving.  Denies any shortness of breath at rest.  Still on 2 L of oxygen per minute.  Objective: Vitals:   06/19/19 1621 06/19/19 2052 06/20/19 0458 06/20/19 0924  BP: 136/86 (!) 159/81 (!) 148/72 139/80  Pulse: 89 84 88 84  Resp: 18 20 19 18   Temp: 99.5 F (37.5 C) 99.7 F (37.6 C) 98.7 F (37.1 C) 99.2 F (37.3 C)  TempSrc: Oral Oral  Oral  SpO2: 98% 93% 100% 99%  Weight:  92.1 kg    Height:        Intake/Output Summary (Last 24 hours) at 06/20/2019 1156 Last data filed at 06/20/2019  0537 Gross per 24 hour  Intake 460 ml  Output 0 ml  Net 460 ml   Filed Weights   06/19/19 0730 06/19/19 1140 06/19/19 2052  Weight: 93.8 kg 92.1 kg 92.1 kg    Examination:  General exam:Not in distress,average built HEENT:PERRL,Oral mucosa moist, Ear/Nose normal on gross exam Respiratory system: Decreased air entry on the left side Cardiovascular system: S1 & S2 heard, RRR. No JVD, murmurs, rubs, gallops or clicks. Gastrointestinal system: Abdomen is nondistended, soft and nontender. No organomegaly or masses felt. Normal bowel sounds heard. Central nervous system: Alert and oriented. No focal neurological deficits. Extremities: No edema, no clubbing ,no cyanosis, distal peripheral pulses palpable. Skin: No rashes, lesions or ulcers,no icterus ,no pallor   Data Reviewed: I have personally reviewed following labs and imaging studies  CBC: Recent Labs  Lab 06/15/19 0619 06/16/19 0617 06/17/19 0700 06/18/19 0557 06/19/19 0448  WBC 26.1* 27.2* 19.6* 13.3* 13.3*  NEUTROABS  --   --   --   --  8.9*  HGB 8.9* 8.3* 8.1* 8.0* 8.0*  HCT 27.2* 24.8* 24.5* 24.5* 24.7*  MCV 95.4 93.2 94.6 95.0 95.7  PLT 364 375 437* 441* [id number]*   Basic Metabolic Panel: Recent Labs  Lab 06/15/19 0619 06/16/19 0617 06/17/19 0534 06/18/19 0557 06/19/19 0448  NA 132* 130* 128* 133* 130*  K 4.5 4.2 4.6 4.6 4.5  CL 92* 91* 90* 95* 92*  CO2 24 24 23 25 22   GLUCOSE 108* 116* 109* 110* 102*  BUN 38* 59* 77* 55* 72*  CREATININE 7.81* 10.50* 12.29* 9.65* 11.28*  CALCIUM 9.1 8.7* 8.6* 8.8* 9.1  PHOS 5.2* 5.0*  --   --  4.9*   GFR: Estimated Creatinine Clearance: 8.6 mL/min (A) (by C-G formula based on SCr of 11.28 mg/dL (H)). Liver Function Tests: Recent Labs  Lab 06/15/19 0619 06/16/19 0617  ALBUMIN 2.0* 1.9*   No results for input(s): LIPASE, AMYLASE in the last 168 hours. No results for input(s): AMMONIA in the last 168 hours. Coagulation Profile: Recent Labs  Lab 06/17/19 0534  INR  1.3*   Cardiac Enzymes: No results for input(s): CKTOTAL, CKMB, CKMBINDEX, TROPONINI in the last 168 hours. BNP (last 3 results) No results for input(s): PROBNP in the last 8760 hours. HbA1C: No results for input(s): HGBA1C in the last 72 hours. CBG: Recent Labs  Lab 06/15/19 1708 06/15/19 2119 06/15/19 2358 06/16/19 0353 06/16/19 0733  GLUCAP 109* 127* 117* 129* 116*   Lipid Profile: No results for input(s): CHOL, HDL, LDLCALC, TRIG, CHOLHDL, LDLDIRECT in the last 72 hours. Thyroid Function Tests: No results for input(s): TSH, T4TOTAL, FREET4, T3FREE, THYROIDAB in the last 72 hours. Anemia Panel: No results for input(s): VITAMINB12, FOLATE, FERRITIN, TIBC, IRON, RETICCTPCT in the last 72 hours. Sepsis Labs: No results for input(s): PROCALCITON, LATICACIDVEN in the last 168 hours.  Recent Results (from the past 240 hour(s))  Culture, blood (routine x 2)     Status: None (Preliminary result)   Collection Time: 06/15/19 11:20 AM   Specimen: BLOOD LEFT HAND  Result Value Ref Range Status   Specimen Description BLOOD LEFT HAND  Final   Special Requests   Final    BOTTLES DRAWN AEROBIC ONLY Blood Culture results may not be optimal due to an inadequate volume of blood received in culture bottles   Culture   Final    NO GROWTH 4 DAYS Performed at [facility], [place] [address], [place], [place] [address]    Report Status PENDING  Incomplete  Culture, blood (routine x 2)     Status: None (Preliminary result)   Collection Time: 06/15/19 11:28 AM   Specimen: BLOOD  Result Value Ref Range Status   Specimen Description BLOOD LEFT ANTECUBITAL  Final   Special Requests   Final    BOTTLES DRAWN AEROBIC ONLY Blood Culture results may not be optimal due to an inadequate volume of blood received in culture bottles   Culture   Final    NO GROWTH 4 DAYS Performed at [facility], [place] [address], [place], [place] [address]    Report Status PENDING  Incomplete   Aerobic/Anaerobic Culture (surgical/deep wound)     Status: None (Preliminary result)   Collection Time: 06/17/19  3:12 PM   Specimen: Intervertebral Disc; Tissue  Result Value Ref Range Status   Specimen Description WOUND  Final   Special Requests L4 L5 DISC ASPIRATION  Final   Gram Stain NO WBC SEEN NO ORGANISMS SEEN   Final   Culture   Final    NO GROWTH 3 DAYS NO ANAEROBES ISOLATED; CULTURE IN PROGRESS FOR 5 DAYS Performed at [facility], [address], [place],  [address]    Report Status PENDING  Incomplete         Radiology Studies: No results found.  Scheduled Meds: . amLODipine  5 mg Oral QHS  . aspirin EC  81 mg Oral Daily  . atorvastatin  20 mg Oral q1800  . darbepoetin (ARANESP) injection - DIALYSIS  100 mcg Intravenous Q Sat-HD  . ferric citrate  630 mg Oral TID WC  . hydrALAZINE  100 mg Oral TID  . labetalol  300 mg Oral BID  . losartan  50 mg Oral BID  . minoxidil  2.5 mg Oral BID  . polyethylene glycol  17 g Oral Daily  . sodium chloride flush  3 mL Intravenous Q12H   Continuous Infusions: . ceFEPime (MAXIPIME) IV 2 g (06/19/19 1723)  . [START ON 06/21/2019] vancomycin       LOS: 7 days    Time spent:35 mins. More than 50% of that time was spent in counseling and/or coordination of care.      Shelly Coss, MD [facility] Pager [phone]  If 7PM-7AM, please contact night-coverage [email or web address] Password [facility] 06/20/2019, 11:56 AM

## 2019-06-20 NOTE — Plan of Care (Signed)
  Problem: Education: Goal: Knowledge of General Education information will improve Description: Including pain rating scale, medication(s)/side effects and non-pharmacologic comfort measures Outcome: Progressing   Problem: Skin Integrity: Goal: Risk for impaired skin integrity will decrease Outcome: Progressing   

## 2019-06-20 NOTE — Plan of Care (Signed)
  Problem: Education: Goal: Knowledge of General Education information will improve Description Including pain rating scale, medication(s)/side effects and non-pharmacologic comfort measures Outcome: Progressing   

## 2019-06-20 NOTE — Progress Notes (Signed)
Frank Fuller for Infectious Disease  Date of Admission:  06/13/2019     Total days of antibiotics 8         ASSESSMENT/PLAN  [name] is feeling better today with decreased back pain. Aspiration of L4-L5 with no organisms on gram stain and no growth on cultures. Will plan for treatment of culture negative lumbar discitis with vancomycin and cefepime with dialysis for 6 weeks with end date of 07/29/19. Treatment of his paraparesis s/p recent aortic dissection procedure will continue with physical and occupational therapy as directed by Vascular Surgery and Neurology.   1. Vancomycin and cefepime with dialysis until 07/29/19.  2. Paraparesis per Vascular surgery and Neurology. 3. Check inflammatory markers every 2 weeks.  4. Follow up in the ID office in 4-5 weeks or sooner if needed.   ID will sign off and be available as needed.   Principal Problem:   Lumbar discitis Active Problems:   ESRD on hemodialysis ([place])   Ileus (HCC)   Proctitis   H/O endovascular stent graft for abdominal aortic aneurysm   . amLODipine  5 mg Oral QHS  . aspirin EC  81 mg Oral Daily  . atorvastatin  20 mg Oral q1800  . darbepoetin (ARANESP) injection - DIALYSIS  100 mcg Intravenous Q Sat-HD  . ferric citrate  630 mg Oral TID WC  . hydrALAZINE  100 mg Oral TID  . labetalol  300 mg Oral BID  . losartan  50 mg Oral BID  . minoxidil  2.5 mg Oral BID  . polyethylene glycol  17 g Oral Daily  . sodium chloride flush  3 mL Intravenous Q12H    SUBJECTIVE:  Afebrile overnight with no acute events. Back pain is improved today and he is feeling better.  Allergies  Allergen Reactions  . Oxycodone Nausea Only  . Chlorhexidine Other (See Comments)    Unknown reaction Patch skin test done at dialysis 06/26/17  - staff using clear dressing and alcohol to clean exit site of catheter  . Clonidine Derivatives Other (See Comments)    Dizziness   . Lisinopril Other (See Comments)    unresponsive  .  Carvedilol Rash     Review of Systems: Review of Systems  Constitutional: Negative for chills, fever and weight loss.  Respiratory: Negative for cough, shortness of breath and wheezing.   Cardiovascular: Negative for chest pain and leg swelling.  Gastrointestinal: Negative for abdominal pain, constipation, diarrhea, nausea and vomiting.  Musculoskeletal: Positive for back pain.  Skin: Negative for rash.  Neurological: Positive for weakness.      OBJECTIVE: Vitals:   06/19/19 1621 06/19/19 2052 06/20/19 0458 06/20/19 0924  BP: 136/86 (!) 159/81 (!) 148/72 139/80  Pulse: 89 84 88 84  Resp: 18 20 19 18   Temp: 99.5 F (37.5 C) 99.7 F (37.6 C) 98.7 F (37.1 C) 99.2 F (37.3 C)  TempSrc: Oral Oral  Oral  SpO2: 98% 93% 100% 99%  Weight:  92.1 kg    Height:       Body mass index is 24.72 kg/m.  Physical Exam Constitutional:      General: He is not in acute distress.    Appearance: He is well-developed.     Comments: Lying in bed with head of bed elevated; pleasant.   Cardiovascular:     Rate and Rhythm: Normal rate and regular rhythm.     Heart sounds: Normal heart sounds.     Comments: Dialysis catheter in right  upper chest with dressing that is clean and dry; no evidence of infection.  Pulmonary:     Effort: Pulmonary effort is normal.     Breath sounds: Normal breath sounds.  Skin:    General: Skin is warm and dry.  Neurological:     Mental Status: He is alert.  Psychiatric:        Mood and Affect: Mood normal.     Lab Results Lab Results  Component Value Date   WBC 13.3 (H) 06/19/2019   HGB 8.0 (L) 06/19/2019   HCT 24.7 (L) 06/19/2019   MCV 95.7 06/19/2019   PLT 454 (H) 06/19/2019    Lab Results  Component Value Date   CREATININE 11.28 (H) 06/19/2019   BUN 72 (H) 06/19/2019   NA 130 (L) 06/19/2019   K 4.5 06/19/2019   CL 92 (L) 06/19/2019   CO2 22 06/19/2019    Lab Results  Component Value Date   ALT <5 05/27/2019   AST 12 (L) 05/27/2019    ALKPHOS 55 05/27/2019   BILITOT 0.5 05/27/2019     Microbiology: Recent Results (from the past 240 hour(s))  Culture, blood (routine x 2)     Status: None   Collection Time: 06/15/19 11:20 AM   Specimen: BLOOD LEFT HAND  Result Value Ref Range Status   Specimen Description BLOOD LEFT HAND  Final   Special Requests   Final    BOTTLES DRAWN AEROBIC ONLY Blood Culture results may not be optimal due to an inadequate volume of blood received in culture bottles   Culture   Final    NO GROWTH 5 DAYS Performed at [facility], [place] [address], [place], [place] [address]    Report Status 06/20/2019 FINAL  Final  Culture, blood (routine x 2)     Status: None   Collection Time: 06/15/19 11:28 AM   Specimen: BLOOD  Result Value Ref Range Status   Specimen Description BLOOD LEFT ANTECUBITAL  Final   Special Requests   Final    BOTTLES DRAWN AEROBIC ONLY Blood Culture results may not be optimal due to an inadequate volume of blood received in culture bottles   Culture   Final    NO GROWTH 5 DAYS Performed at [facility], [place] [address] [place], [place] [address]    Report Status 06/20/2019 FINAL  Final  Aerobic/Anaerobic Culture (surgical/deep wound)     Status: None (Preliminary result)   Collection Time: 06/17/19  3:12 PM   Specimen: Intervertebral Disc; Tissue  Result Value Ref Range Status   Specimen Description WOUND  Final   Special Requests L4 L5 DISC ASPIRATION  Final   Gram Stain NO WBC SEEN NO ORGANISMS SEEN   Final   Culture   Final    NO GROWTH 3 DAYS NO ANAEROBES ISOLATED; CULTURE IN PROGRESS FOR 5 DAYS Performed at [facility], [place] [address], [place],  [address]    Report Status PENDING  Incomplete     Terri Piedra, [place] [place] Pager  06/20/2019  2:02 PM

## 2019-06-20 NOTE — Progress Notes (Signed)
Inpatient Rehabilitation Admissions Coordinator  I met with patient at bedside to discuss rehab goals and expectations. His pain is less today  and pt interactive. I await further tolerance demonstration for more aggressive therapies before admitting to CIR. Needs to be able to sit up for an hour at a time. I will follow up on Monday.  Danne Baxter, RN, MSN Rehab Admissions Coordinator [phone] 06/20/2019 11:57 AM'

## 2019-06-20 NOTE — Progress Notes (Signed)
Physical Therapy Treatment Patient Details Name: Frank Fuller MRN: [id number] DOB: [birth year] Today's Date: 06/20/2019    History of Present Illness Patient is a 58 year old male with history of hypertension, ESRD on dialysis( Tuesday,Thursday,Saturday), abdominal aortic aneurysm status post stent, hepatitis C virus status post treatment, depression, CVA who initially presented to [facility] with multiple complaints.  He was recently hospitalized from 8/11-8/20 for dissection of thoracic aorta(type B) and was treated with endovascular stent graft with spinal drain by Dr. Oneida Alar on 8/14.  He was discharged home but immediately came back with complaints of urge to use the restroom but unable to do so.  He also had fever.  He was found to have paraplegia.  He was suspected to have thoracic ischemia.  CT imaging also showed possible osteomyelitis/discitis of L4-L5.  Also suspected to have sepsis secondary to possible pneumonia. Underwent disc aspiration by IR on 06/18/2019    PT Comments    Patient received in bed, initially states he is not doing anything right now as he needs pain medicine. Reports he has already asked RN for this. After 15 minutes, returned and patient continues to report 8/10 pain B LEs after pain medication. Patient is agreeable to attempt getting into the recliner. Supine to sit patient requires no physical assist. Sit to stand first attempt required mod +2, and was unable to get fully upright before sitting back down. 2nd attempt patient used walker and was able to achieve full standing however was only able to maintain x 5 seconds. Patient declined attempting squat pivot to recliner as by now he reports chest pain and wants to lie back down.  Required min assist to go from sit to supine. Patient will benefit from continued skilled PT to improve strength and functional independence.                  Follow Up Recommendations  CIR     Equipment Recommendations        Recommendations for Other Services Rehab consult     Precautions / Restrictions Precautions Precautions: Fall Restrictions Weight Bearing Restrictions: No    Mobility  Bed Mobility Overal bed mobility: Needs Assistance Bed Mobility: Supine to Sit;Sit to Supine     Supine to sit: Min guard Sit to supine: Min assist   General bed mobility comments: supine to sit performed with HOB elevated and patient just "walked" LEs around off side of bed. To return to supine patient went from sit to sidelying to supine. Required min assist to bring LEs up onto bed. Patient tremulous with sitting on side of bed and with increased activity. After 2 attempts at standing patient then reported chest pain and wanted to lie back down.  Transfers Overall transfer level: Needs assistance Equipment used: Rolling walker (2 wheeled) Transfers: Sit to/from Stand Sit to Stand: Mod assist;+2 physical assistance         General transfer comment: with B hand held assist, patient was unable to achieve full upright standing. Used walker on 2nd attempt and patient able to get fully standing but could not stand for more than 5 seconds before sitting back down.  Ambulation/Gait             General Gait Details: unable at this time   Stairs             Wheelchair Mobility    Modified Rankin (Stroke Patients Only)       Balance Overall balance assessment: Needs assistance Sitting-balance support: Feet  supported Sitting balance-Leahy Scale: Poor Sitting balance - Comments: jerking/tremulous   Standing balance support: Bilateral upper extremity supported;During functional activity Standing balance-Leahy Scale: Zero                              Cognition Arousal/Alertness: Awake/alert Behavior During Therapy: WFL for tasks assessed/performed Overall Cognitive Status: Within Functional Limits for tasks assessed                                         Exercises      General Comments        Pertinent Vitals/Pain Pain Assessment: 0-10 Pain Score: 8  Pain Location: back, B LEs Pain Descriptors / Indicators: Discomfort;Aching;Grimacing;Guarding Pain Intervention(s): Limited activity within patient's tolerance;Monitored during session;Repositioned;Premedicated before session    Home Living Family/patient expects to be discharged to:: Private residence Living Arrangements: Alone Available Help at Discharge: Family;Available PRN/intermittently Type of Home: House Home Access: Stairs to enter Entrance Stairs-Rails: Can reach both;Left;Right Home Layout: One level Home Equipment: Grab bars - tub/shower;Shower seat;Hand held shower head      Prior Function Level of Independence: Independent      Comments: ambulating without AD, driving and independent in ADLs   PT Goals (current goals can now be found in the care plan section) Acute Rehab PT Goals Patient Stated Goal: get home PT Goal Formulation: With patient Time For Goal Achievement: 07/02/19 Potential to Achieve Goals: Fair Progress towards PT goals: Progressing toward goals    Frequency    Min 3X/week      PT Plan      Co-evaluation              AM-PAC PT "6 Clicks" Mobility   Outcome Measure  Help needed turning from your back to your side while in a flat bed without using bedrails?: A Little Help needed moving from lying on your back to sitting on the side of a flat bed without using bedrails?: A Little Help needed moving to and from a bed to a chair (including a wheelchair)?: Total Help needed standing up from a chair using your arms (e.g., wheelchair or bedside chair)?: A Lot Help needed to walk in hospital room?: Total Help needed climbing 3-5 steps with a railing? : Total 6 Click Score: 11    End of Session Equipment Utilized During Treatment: Gait belt Activity Tolerance: Patient tolerated treatment well Patient left: in bed;with bed alarm  set;with call bell/phone within reach Nurse Communication: Mobility status PT Visit Diagnosis: Unsteadiness on feet (R26.81);Muscle weakness (generalized) (M62.81);Other abnormalities of gait and mobility (R26.89);Pain Pain - Right/Left: Left(B LEs) Pain - part of body: Leg;Ankle and joints of foot     Time: 1530-1553 PT Time Calculation (min) (ACUTE ONLY): 23 min  Charges:  $Therapeutic Activity: 23-37 mins                     Tamlyn Sides, PT, GCS 06/20/19,4:15 PM

## 2019-06-20 NOTE — Progress Notes (Signed)
[facility]  Subjective  - no real change legs working today   Objective 139/80 84 99.2 F (37.3 C) (Oral) 18 99%  Intake/Output Summary (Last 24 hours) at 06/20/2019 1325 Last data filed at 06/20/2019 0537 Gross per 24 hour  Intake 460 ml  Output 0 ml  Net 460 ml    Assessment/Planning: S/p aortic dissection repair Encouraged pt to go to rehab for best outcome Will see pt Monday.  Dr Scot Dock on call this weekend if questions  Ruta Hinds 06/20/2019 1:25 PM --  Laboratory Lab Results: Recent Labs    06/18/19 0557 06/19/19 0448  WBC 13.3* 13.3*  HGB 8.0* 8.0*  HCT 24.5* 24.7*  PLT 441* 454*   BMET Recent Labs    06/18/19 0557 06/19/19 0448  NA 133* 130*  K 4.6 4.5  CL 95* 92*  CO2 25 22  GLUCOSE 110* 102*  BUN 55* 72*  CREATININE 9.65* 11.28*  CALCIUM 8.8* 9.1    COAG Lab Results  Component Value Date   INR 1.3 (H) 06/17/2019   INR 1.3 (H) 06/05/2019   INR 1.3 (H) 06/03/2019   No results found for: PTT

## 2019-06-20 NOTE — Plan of Care (Signed)
  Problem: Health Behavior/Discharge Planning: Goal: Ability to manage health-related needs will improve Outcome: Progressing   

## 2019-06-20 NOTE — Progress Notes (Signed)
[facility] Progress Note   Subjective:   Patient seen and examined at bedside. Overall feeling a little better.  Pain mostly well controlled right now.  Denies SOB, CP, n/v/d.  States dialysis yesterday went well.  Strength in legs with some improvement.   Objective Vitals:   06/19/19 1621 06/19/19 2052 06/20/19 0458 06/20/19 0924  BP: 136/86 (!) 159/81 (!) 148/72 139/80  Pulse: 89 84 88 84  Resp: 18 20 19 18   Temp: 99.5 F (37.5 C) 99.7 F (37.6 C) 98.7 F (37.1 C) 99.2 F (37.3 C)  TempSrc: Oral Oral  Oral  SpO2: 98% 93% 100% 99%  Weight:  92.1 kg    Height:       Physical Exam General:NAD, chronically ill appearing male, laying in bed Heart:RRR, no mrg Lungs:CTAB with 2L O2 Abdomen:soft, NTND Extremities:no LE edema Dialysis Access: [facility]   Filed Weights   06/19/19 0730 06/19/19 1140 06/19/19 2052  Weight: 93.8 kg 92.1 kg 92.1 kg    Intake/Output Summary (Last 24 hours) at 06/20/2019 1105 Last data filed at 06/20/2019 0537 Gross per 24 hour  Intake 460 ml  Output 2000 ml  Net -1540 ml    Additional Objective Labs: Basic Metabolic Panel: Recent Labs  Lab 06/15/19 0619 06/16/19 0617 06/17/19 0534 06/18/19 0557 06/19/19 0448  NA 132* 130* 128* 133* 130*  K 4.5 4.2 4.6 4.6 4.5  CL 92* 91* 90* 95* 92*  CO2 24 24 23 25 22   GLUCOSE 108* 116* 109* 110* 102*  BUN 38* 59* 77* 55* 72*  CREATININE 7.81* 10.50* 12.29* 9.65* 11.28*  CALCIUM 9.1 8.7* 8.6* 8.8* 9.1  PHOS 5.2* 5.0*  --   --  4.9*   Liver Function Tests: Recent Labs  Lab 06/15/19 0619 06/16/19 0617  ALBUMIN 2.0* 1.9*  CBC: Recent Labs  Lab 06/15/19 0619 06/16/19 0617 06/17/19 0700 06/18/19 0557 06/19/19 0448  WBC 26.1* 27.2* 19.6* 13.3* 13.3*  NEUTROABS  --   --   --   --  8.9*  HGB 8.9* 8.3* 8.1* 8.0* 8.0*  HCT 27.2* 24.8* 24.5* 24.5* 24.7*  MCV 95.4 93.2 94.6 95.0 95.7  PLT 364 375 437* 441* 454*   Blood Culture    Component Value Date/Time   SDES WOUND 06/17/2019  1512   SPECREQUEST L4 L5 DISC ASPIRATION 06/17/2019 1512   CULT  06/17/2019 1512    NO GROWTH 2 DAYS NO ANAEROBES ISOLATED; CULTURE IN PROGRESS FOR 5 DAYS Performed at [facility], [place] [address] [place], [place] [address]    REPTSTATUS PENDING 06/17/2019 1512   CBG: Recent Labs  Lab 06/15/19 1708 06/15/19 2119 06/15/19 2358 06/16/19 0353 06/16/19 0733  GLUCAP 109* 127* 117* 129* 116*   Medications: . ceFEPime (MAXIPIME) IV 2 g (06/19/19 1723)  . [START ON 06/21/2019] vancomycin     . amLODipine  5 mg Oral QHS  . aspirin EC  81 mg Oral Daily  . atorvastatin  20 mg Oral q1800  . darbepoetin (ARANESP) injection - DIALYSIS  100 mcg Intravenous Q Sat-HD  . ferric citrate  630 mg Oral TID WC  . hydrALAZINE  100 mg Oral TID  . labetalol  300 mg Oral BID  . losartan  50 mg Oral BID  . minoxidil  2.5 mg Oral BID  . polyethylene glycol  17 g Oral Daily  . sodium chloride flush  3 mL Intravenous Q12H    Dialysis Orders: [facility]  4h 11min 400/1.5 87.5kg 2/2.25  [facility] TDC Hep 3000  - Parsabiv 2.5mg  IV q HD  - Calcitriol 1.70mcg PO q HD  Assessment/Plan: 1. Fever/leukocytosis: s/p recent aortic stent and TDC.  Possible PNA & proctitis on initial CT completed at [facility].  BC neg from [place], repeat 8/23 w/NGTD.  On Vanc/Cefepime.  WBC improving. Afebrile 2. L4-5 lumbar diskitis: suspected cause of #1.  On Vanc/Cefepime x6wks per ID.  IR aspiration pending.  3. Spinal cord infarct: per spinal MRI 8/23, after complaints of not being able to move legs.  Neurosurgery consulted, no plan for intervention, recommend daily ASA.  CIR to evaluate for possible placement.  4. Hx Type B aortic dissection (s/p stent graft repair 8/14) - goal SBP per VVS 120-170.  Known severe resistant HTN.  Trying to avoid BP drops on HD.  On ASA. 5. ESRD - on HD TTS. Orders written for HD tomorrow per regular schedule. K4.5 6. Anemia of CKD-  Hgb 8.0. Continue aranesp 146mcg qwk 7. Secondary  hyperparathyroidism - Ca/phos in goal. Continue auryxia and calcitriol.  Parsabiv not on hospital formulary. 8. HTN/volume - BP currently well controlled.  Does not appear fluid overloaded.  Titrate down as tolerated.  9. Nutrition - Renal diet w/fluid restrictions. Alb 1.9. protein supplements, vit 10. Recent MSSA bacteremia/AVG infection - Hx infected AVG s/p revision in 5/20, then partial resection in 6/20, then all AVG material (x2) removed 6/30. S/p 6 weeks Ancef completed.  Jen Mow, PA-C [facility] Pager: [phone] 06/20/2019,11:05 AM  LOS: 7 days

## 2019-06-21 ENCOUNTER — Inpatient Hospital Stay (HOSPITAL_COMMUNITY): Payer: Medicare Other

## 2019-06-21 LAB — RENAL FUNCTION PANEL
Albumin: 1.9 g/dL — ABNORMAL LOW (ref 3.5–5.0)
Anion gap: 13 (ref 5–15)
BUN: 39 mg/dL — ABNORMAL HIGH (ref 6–20)
CO2: 25 mmol/L (ref 22–32)
Calcium: 8.7 mg/dL — ABNORMAL LOW (ref 8.9–10.3)
Chloride: 94 mmol/L — ABNORMAL LOW (ref 98–111)
Creatinine, Ser: 6.99 mg/dL — ABNORMAL HIGH (ref 0.61–1.24)
GFR calc Af Amer: 9 mL/min — ABNORMAL LOW (ref >60)
GFR calc non Af Amer: 8 mL/min — ABNORMAL LOW (ref >60)
Glucose, Bld: 94 mg/dL (ref 70–99)
Phosphorus: 3.1 mg/dL (ref 2.5–4.6)
Potassium: 3.4 mmol/L — ABNORMAL LOW (ref 3.5–5.1)
Sodium: 132 mmol/L — ABNORMAL LOW (ref 135–145)

## 2019-06-21 LAB — HEMOGLOBIN AND HEMATOCRIT, BLOOD
HCT: 26.2 % — ABNORMAL LOW (ref 39.0–52.0)
Hemoglobin: 8.2 g/dL — ABNORMAL LOW (ref 13.0–17.0)

## 2019-06-21 LAB — CBC
HCT: 23.9 % — ABNORMAL LOW (ref 39.0–52.0)
Hemoglobin: 7.7 g/dL — ABNORMAL LOW (ref 13.0–17.0)
MCH: 30.4 pg (ref 26.0–34.0)
MCHC: 32.2 g/dL (ref 30.0–36.0)
MCV: 94.5 fL (ref 80.0–100.0)
Platelets: 341 10*3/uL (ref 150–400)
RBC: 2.53 MIL/uL — ABNORMAL LOW (ref 4.22–5.81)
RDW: 16.8 % — ABNORMAL HIGH (ref 11.5–15.5)
WBC: 7.6 10*3/uL (ref 4.0–10.5)
nRBC: 0 % (ref 0.0–0.2)

## 2019-06-21 MED ORDER — VANCOMYCIN HCL IN DEXTROSE 1-5 GM/200ML-% IV SOLN
INTRAVENOUS | Status: AC
  Administered 2019-06-21: 1000 mg via INTRAVENOUS
  Filled 2019-06-21: qty 200

## 2019-06-21 MED ORDER — HEPARIN SODIUM (PORCINE) 1000 UNIT/ML DIALYSIS: 1000.0000 [IU] | INTRAMUSCULAR | Status: DC | PRN

## 2019-06-21 MED ORDER — SODIUM CHLORIDE 0.9 % IV SOLN: 100.0000 mL | INTRAVENOUS | Status: DC | PRN

## 2019-06-21 MED ORDER — LIDOCAINE-PRILOCAINE 2.5-2.5 % EX CREA: 1.0000 "application " | TOPICAL_CREAM | CUTANEOUS | Status: DC | PRN

## 2019-06-21 MED ORDER — ALTEPLASE 2 MG IJ SOLR: 2.0000 mg | Freq: Once | INTRAMUSCULAR | Status: DC | PRN

## 2019-06-21 MED ORDER — OXYCODONE HCL 5 MG PO TABS
ORAL_TABLET | ORAL | Status: AC
  Administered 2019-06-21: 5 mg
  Filled 2019-06-21: qty 1

## 2019-06-21 MED ORDER — HEPARIN SODIUM (PORCINE) 1000 UNIT/ML IJ SOLN
INTRAMUSCULAR | Status: AC
  Filled 2019-06-21: qty 1

## 2019-06-21 MED ORDER — LIDOCAINE HCL (PF) 1 % IJ SOLN: 5.0000 mL | INTRAMUSCULAR | Status: DC | PRN

## 2019-06-21 MED ORDER — DARBEPOETIN ALFA 100 MCG/0.5ML IJ SOSY
PREFILLED_SYRINGE | INTRAMUSCULAR | Status: AC
  Administered 2019-06-21: 100 ug via INTRAVENOUS
  Filled 2019-06-21: qty 0.5

## 2019-06-21 MED ORDER — AMLODIPINE BESYLATE 10 MG PO TABS
10.0000 mg | ORAL_TABLET | Freq: Every day | ORAL | Status: DC
  Administered 2019-06-21: 10 mg via ORAL
  Filled 2019-06-21: qty 1

## 2019-06-21 MED ORDER — PENTAFLUOROPROP-TETRAFLUOROETH EX AERO: 1.0000 "application " | INHALATION_SPRAY | CUTANEOUS | Status: DC | PRN

## 2019-06-21 MED ORDER — MORPHINE SULFATE (PF) 2 MG/ML IV SOLN
2.0000 mg | INTRAVENOUS | Status: DC | PRN
  Administered 2019-06-22 (×2): 2 mg via INTRAVENOUS
  Administered 2019-06-23: 4 mg via INTRAVENOUS
  Administered 2019-06-23: 2 mg via INTRAVENOUS
  Administered 2019-06-24 – 2019-06-26 (×4): 4 mg via INTRAVENOUS
  Administered 2019-06-26 – 2019-07-02 (×4): 2 mg via INTRAVENOUS
  Filled 2019-06-21 (×2): qty 1
  Filled 2019-06-21: qty 2
  Filled 2019-06-21: qty 1
  Filled 2019-06-21: qty 2
  Filled 2019-06-21 (×5): qty 1
  Filled 2019-06-21 (×2): qty 2
  Filled 2019-06-21: qty 1

## 2019-06-21 MED ORDER — SENNOSIDES-DOCUSATE SODIUM 8.6-50 MG PO TABS: 1.0000 | ORAL_TABLET | Freq: Every evening | ORAL | Status: DC | PRN

## 2019-06-21 MED ORDER — POTASSIUM CHLORIDE CRYS ER 20 MEQ PO TBCR
40.0000 meq | EXTENDED_RELEASE_TABLET | Freq: Once | ORAL | Status: AC
  Administered 2019-06-21: 40 meq via ORAL
  Filled 2019-06-21: qty 2

## 2019-06-21 NOTE — Plan of Care (Signed)
  Problem: Education: Goal: Knowledge of General Education information will improve Description: Including pain rating scale, medication(s)/side effects and non-pharmacologic comfort measures Outcome: Progressing   Problem: Clinical Measurements: Goal: Diagnostic test results will improve Outcome: Progressing   Problem: Coping: Goal: Level of anxiety will decrease Outcome: Progressing   

## 2019-06-21 NOTE — Procedures (Signed)
I was present at this dialysis session. I have reviewed the session itself and made appropriate changes.   2K bath. UF goal 3L. [place] woeking well.  Possibly to CIR.  NO c/o. AM labs pending  Filed Weights   06/19/19 2052 06/20/19 2108 06/21/19 0633  Weight: 92.1 kg 93 kg 93.4 kg    Recent Labs  Lab 06/19/19 0448  NA 130*  K 4.5  CL 92*  CO2 22  GLUCOSE 102*  BUN 72*  CREATININE 11.28*  CALCIUM 9.1  PHOS 4.9*    Recent Labs  Lab 06/17/19 0700 06/18/19 0557 06/19/19 0448  WBC 19.6* 13.3* 13.3*  NEUTROABS  --   --  8.9*  HGB 8.1* 8.0* 8.0*  HCT 24.5* 24.5* 24.7*  MCV 94.6 95.0 95.7  PLT 437* 441* 454*    Scheduled Meds: . amLODipine  5 mg Oral QHS  . aspirin EC  81 mg Oral Daily  . atorvastatin  20 mg Oral q1800  . darbepoetin (ARANESP) injection - DIALYSIS  100 mcg Intravenous Q Sat-HD  . ferric citrate  630 mg Oral TID WC  . hydrALAZINE  100 mg Oral TID  . labetalol  300 mg Oral BID  . losartan  50 mg Oral BID  . minoxidil  2.5 mg Oral BID  . polyethylene glycol  17 g Oral Daily  . sodium chloride flush  3 mL Intravenous Q12H   Continuous Infusions: . sodium chloride    . sodium chloride    . ceFEPime (MAXIPIME) IV Stopped (06/19/19 1823)  . vancomycin     PRN Meds:.sodium chloride, sodium chloride, acetaminophen **OR** acetaminophen, albuterol, alteplase, fentaNYL (SUBLIMAZE) injection, ferric citrate, heparin, hydrALAZINE, lidocaine (PF), lidocaine-prilocaine, ondansetron **OR** ondansetron (ZOFRAN) IV, oxyCODONE, pentafluoroprop-tetrafluoroeth   [name]  MD 06/21/2019, 8:17 AM

## 2019-06-21 NOTE — Progress Notes (Signed)
PROGRESS NOTE    Frank Fuller  [id number] DOB: 07-08-1961 DOA: 06/13/2019 PCP: [facility]   Brief Narrative:  Patient is a 58 year old male with history of hypertension, ESRD on dialysis( Tuesday,Thursday,Saturday), abdominal aortic aneurysm status post stent, hepatitis C virus status post treatment, depression, CVA who initially presented to [facility] with multiple complaints.  He was recently hospitalized from 8/11-8/20 for dissection of thoracic aorta(type B) and was treated with endovascular stent graft with spinal drain by Dr. Oneida Alar on 8/14.  He was discharged home but immediately came back with complaints of urge to use the restroom but unable to do so.  He also had fever.  He was found to have paraplegia.  He was suspected to have thoracic spinal ischemia.  CT imaging also showed possible osteomyelitis/discitis of L4-L5.  Also suspected to have sepsis secondary to possible pneumonia. Underwent disc aspiration by IR on 06/18/2019.  Neurosurgery, vascular surgery, ID following. Patient is medically stable for discharge to CIR as soon as the bed is available.  Assessment & Plan:   Principal Problem:   Lumbar discitis Active Problems:   ESRD on hemodialysis (HCC)   Ileus (HCC)   Proctitis   H/O endovascular stent graft for abdominal aortic aneurysm   Suspected sepsis secondary to pneumonia discitis/osteomyelitis: Presented with fever of 102F, leukocytosis.  CRP/procalcitonin was elevated.  CT also showed possible right-sided pneumonia with Iileus and proctitis.  Cultures from [facility] did not show any growth.  Cultures sent here has not shown any growth yet.  Started on broad-spectrum antibiotics with vancomycin and cefepime.  Currently is afebrile.   MRI lumbar spine showed new space-occupying material in the lower retroperitoneum and lumbosacral junction.  Favored acute discitis and osteomyelitis at L4-L5 with pervertebral phlegmon.  Neurosurgery ,  interventional radiology consulted .S/P disc aspiration.Culture negative so far. Infectious disease was following and recommended 6 weeks of antibiotics with dialysis.  Cultures have remained negative.  Hypoxia:On presentation he was  noted to be hypoxic with desaturation on room air.Pneumonia suspected.  Continue supplemental oxygen.  CT had shown left pleural effusion, proBNP was elevated.  Continue volume control with dialysis.Last chest x-ray shows left base atelectasis.  We will try to taper off oxygen.  Abdomen pain/acute ileus/proctitis: Noted on CT imaging at outside hospital showing signs of ileus with large stool burden and inflammation of rectum.  Denies any abdominal pain. Tolerating diet. No obstruction as per last abdominal x-ray.  ESRD on dialysis:ESRD on dialysis( Tuesday,Thursday,Saturday).  Nephrology following.  Dialysed today.  Anemia of chronic disease: Associated with ESRD.  Currently CBC stable.  Continue to monitor.  Left upper extremity swelling: Doppler ultrasound did not show any DVT.  Continue to monitor  Thoracic aortic dissection: Status post endovascular stent placement.  Vascular surgery was following  Hypertension: Currently blood pressure stable.  Continue current medicines .Continue to monitor blood pressure.  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  Abnormal spinal cord at T9-T10, T10-11, possible segmental cord infarct.  Neurosurgery consulted and  signed off.  No plan for intervention. Recommended daily baby aspirin by neurology.        DVT prophylaxis: SCD Code Status: Full Family Communication: None present at the bedside Disposition Plan: CIR as soon as bed is available  Consultants: Nephrology, ID, vascular surgery  Procedures: Dialysis, disc aspiration Antimicrobials:  Anti-infectives  (From admission, onward)   Start  Dose/Rate Route Frequency Ordered Stop   06/21/19 1200  vancomycin (VANCOCIN) IVPB 1000 mg/200 mL premix     1,000 mg 200 mL/hr over 60 Minutes Intravenous Every T-Th-Sa (Hemodialysis) 06/20/19 0800     06/18/19 0808  vancomycin variable dose per unstable renal function (pharmacist dosing)  Status:  Discontinued      Does not apply See admin instructions 06/18/19 0808 06/20/19 0805   06/14/19 1800  ceFEPIme (MAXIPIME) 2 g in sodium chloride 0.9 % 100 mL IVPB     2 g 200 mL/hr over 30 Minutes Intravenous Every T-Th-Sa (1800) 06/13/19 1540     06/14/19 1200  vancomycin (VANCOCIN) IVPB 1000 mg/200 mL premix  Status:  Discontinued     1,000 mg 200 mL/hr over 60 Minutes Intravenous Every T-Th-Sa (Hemodialysis) 06/13/19 1540 06/18/19 0808   06/13/19 1545  vancomycin (VANCOCIN) 2,000 mg in sodium chloride 0.9 % 500 mL IVPB     2,000 mg 250 mL/hr over 120 Minutes Intravenous  Once 06/13/19 1519 06/13/19 1829   06/13/19 1545  ceFEPIme (MAXIPIME) 2 g in sodium chloride 0.9 % 100 mL IVPB     2 g 200 mL/hr over 30 Minutes Intravenous  Once 06/13/19 1519 06/13/19 2241      Subjective:  Patient seen and examined the bedside this morning at the dialysis suite.  Remains comfortable, hemodynamically stable.  No complaints.  No active issues.  Objective: Vitals:   06/21/19 0900 06/21/19 0930 06/21/19 1000 06/21/19 1030  BP: (!) 172/88 (!) 173/78 (!) 189/99 (!) 176/93  Pulse: 87 76 81 83  Resp:      Temp:      TempSrc:      SpO2:      Weight:      Height:        Intake/Output Summary (Last 24 hours) at 06/21/2019 1147 Last data filed at 06/21/2019 0800 Gross per 24 hour  Intake 784.89 ml  Output 0 ml  Net 784.89 ml   Filed Weights   06/19/19 2052 06/20/19 2108 06/21/19 0633  Weight: 92.1 kg 93 kg 93.4 kg    Examination:  General exam: Appears calm and comfortable ,Not in distress,average built HEENT:PERRL,Oral mucosa moist, Ear/Nose normal on  gross exam Respiratory system: Decreased air entry on the left side Cardiovascular system: S1 & S2 heard, RRR. No JVD, murmurs, rubs, gallops or clicks. Gastrointestinal system: Abdomen is nondistended, soft and nontender. No organomegaly or masses felt. Normal bowel sounds heard. Central nervous system: Alert and oriented. No focal neurological deficits. Extremities: No edema, no clubbing ,no cyanosis, distal peripheral pulses palpable. Skin: No rashes, lesions or ulcers,no icterus ,no pallor  Data Reviewed: I have personally reviewed following labs and imaging studies  CBC: Recent Labs  Lab 06/16/19 0617 06/17/19 0700 06/18/19 0557 06/19/19 0448 06/21/19 0813  WBC 27.2* 19.6* 13.3* 13.3* 7.6  NEUTROABS  --   --   --  8.9*  --   HGB 8.3* 8.1* 8.0* 8.0* 7.7*  HCT 24.8* 24.5* 24.5* 24.7* 23.9*  MCV 93.2 94.6 95.0 95.7 94.5  PLT 375 437* 441* 454* [id number]   Basic Metabolic Panel: Recent Labs  Lab 06/15/19 0619 06/16/19 0617 06/17/19 0534 06/18/19 0557 06/19/19 0448 06/21/19 0812  NA 132* 130* 128* 133* 130* 132*  K 4.5 4.2 4.6 4.6 4.5 3.4*  CL 92* 91* 90* 95* 92* 94*  CO2 24 24 23 25 22 25   GLUCOSE 108* 116* 109* 110* 102* 94  BUN 38* 59* 77* 55* 72* 39*  CREATININE 7.81* 10.50* 12.29* 9.65* 11.28* 6.99*  CALCIUM 9.1 8.7* 8.6* 8.8* 9.1 8.7*  PHOS 5.2* 5.0*  --   --  4.9* 3.1   GFR: Estimated Creatinine Clearance: 13.8 mL/min (A) (by C-G formula based on SCr of 6.99 mg/dL (H)). Liver Function Tests: Recent Labs  Lab 06/15/19 0619 06/16/19 0617 06/21/19 0812  ALBUMIN 2.0* 1.9* 1.9*   No results for input(s): LIPASE, AMYLASE in the last 168 hours. No results for input(s): AMMONIA in the last 168 hours. Coagulation Profile: Recent Labs  Lab 06/17/19 0534  INR 1.3*   Cardiac Enzymes: No results for input(s): CKTOTAL, CKMB, CKMBINDEX, TROPONINI in the last 168 hours. BNP (last 3 results) No results for input(s): PROBNP in the last 8760 hours. HbA1C: No results  for input(s): HGBA1C in the last 72 hours. CBG: Recent Labs  Lab 06/15/19 1708 06/15/19 2119 06/15/19 2358 06/16/19 0353 06/16/19 0733  GLUCAP 109* 127* 117* 129* 116*   Lipid Profile: No results for input(s): CHOL, HDL, LDLCALC, TRIG, CHOLHDL, LDLDIRECT in the last 72 hours. Thyroid Function Tests: No results for input(s): TSH, T4TOTAL, FREET4, T3FREE, THYROIDAB in the last 72 hours. Anemia Panel: No results for input(s): VITAMINB12, FOLATE, FERRITIN, TIBC, IRON, RETICCTPCT in the last 72 hours. Sepsis Labs: No results for input(s): PROCALCITON, LATICACIDVEN in the last 168 hours.  Recent Results (from the past 240 hour(s))  Culture, blood (routine x 2)     Status: None   Collection Time: 06/15/19 11:20 AM   Specimen: BLOOD LEFT HAND  Result Value Ref Range Status   Specimen Description BLOOD LEFT HAND  Final   Special Requests   Final    BOTTLES DRAWN AEROBIC ONLY Blood Culture results may not be optimal due to an inadequate volume of blood received in culture bottles   Culture   Final    NO GROWTH 5 DAYS Performed at [facility], [place] [address] [place], [place] [address]    Report Status 06/20/2019 FINAL  Final  Culture, blood (routine x 2)     Status: None   Collection Time: 06/15/19 11:28 AM   Specimen: BLOOD  Result Value Ref Range Status   Specimen Description BLOOD LEFT ANTECUBITAL  Final   Special Requests   Final    BOTTLES DRAWN AEROBIC ONLY Blood Culture results may not be optimal due to an inadequate volume of blood received in culture bottles   Culture   Final    NO GROWTH 5 DAYS Performed at [facility], [place] [address], [place], [place] [address]    Report Status 06/20/2019 FINAL  Final  Aerobic/Anaerobic Culture (surgical/deep wound)     Status: None (Preliminary result)   Collection Time: 06/17/19  3:12 PM   Specimen: Intervertebral Disc; Tissue  Result Value Ref Range Status   Specimen Description WOUND  Final   Special Requests L4 L5  DISC ASPIRATION  Final   Gram Stain NO WBC SEEN NO ORGANISMS SEEN   Final   Culture   Final    NO GROWTH 3 DAYS NO ANAEROBES ISOLATED; CULTURE IN PROGRESS FOR 5 DAYS Performed at [facility], [place] [address], [place], [place] [address]    Report Status PENDING  Incomplete         Radiology Studies: No results found.      Scheduled Meds: . amLODipine  5 mg Oral QHS  . aspirin EC  81 mg Oral Daily  . atorvastatin  20 mg Oral q1800  .  darbepoetin (ARANESP) injection - DIALYSIS  100 mcg Intravenous Q Sat-HD  . ferric citrate  630 mg Oral TID WC  . heparin      . hydrALAZINE  100 mg Oral TID  . labetalol  300 mg Oral BID  . losartan  50 mg Oral BID  . minoxidil  2.5 mg Oral BID  . oxyCODONE      . polyethylene glycol  17 g Oral Daily  . sodium chloride flush  3 mL Intravenous Q12H   Continuous Infusions: . sodium chloride    . sodium chloride    . ceFEPime (MAXIPIME) IV Stopped (06/19/19 1823)  . vancomycin 1,000 mg (06/21/19 1020)     LOS: 8 days    Time spent:35 mins. More than 50% of that time was spent in counseling and/or coordination of care.      Shelly Coss, MD [facility] Pager [phone]  If 7PM-7AM, please contact night-coverage [email or web address] Password [facility] 06/21/2019, 11:47 AM

## 2019-06-21 NOTE — Progress Notes (Signed)
Pt had an episode of bright red blood and 4 medium size clots that saturated the bed pad. Pt complain of pain in scrotum and penis area  Bodenheimer NP notified VS 171/65 heart rate 115 , temp 98.8 sats 99% 2lit [place]. Will continue to monitor

## 2019-06-21 NOTE — Progress Notes (Addendum)
Notified by RN of pt experiencing some rectal bleeding/bloody stools x 2. Pt having some abdominal pain but abdomen is soft and non-tender. Acute ileus and proctitis seen previously on CT. Abd X-ray showed: Large amount of colonic stool. No evidence of small bowel obstruction. H&H drawn and was 8.2 up from 7.7. VSS.  Will dc Fentanyl and order Morphine 2-4mg  Q3H PRN. Will also add Senokot-S given large stool burden seen on X-Ray. If bleeding persist will consult GI.  Arby Barrette APRN-C [facility] Pager [phone]

## 2019-06-21 NOTE — Progress Notes (Signed)
Patient had a incontinent bm with red and black clots noted. Attending MD notified.

## 2019-06-22 ENCOUNTER — Inpatient Hospital Stay (HOSPITAL_COMMUNITY): Payer: Medicare Other

## 2019-06-22 DIAGNOSIS — K3189 Other diseases of stomach and duodenum: Secondary | ICD-10-CM

## 2019-06-22 DIAGNOSIS — K921 Melena: Secondary | ICD-10-CM

## 2019-06-22 LAB — CBC WITH DIFFERENTIAL/PLATELET
Abs Immature Granulocytes: 0.58 10*3/uL — ABNORMAL HIGH (ref 0.00–0.07)
Basophils Absolute: 0.1 10*3/uL (ref 0.0–0.1)
Basophils Relative: 0 %
Eosinophils Absolute: 0 10*3/uL (ref 0.0–0.5)
Eosinophils Relative: 0 %
HCT: 19.2 % — ABNORMAL LOW (ref 39.0–52.0)
Hemoglobin: 6.2 g/dL — CL (ref 13.0–17.0)
Immature Granulocytes: 3 %
Lymphocytes Relative: 5 %
Lymphs Abs: 1.2 10*3/uL (ref 0.7–4.0)
MCH: 30.8 pg (ref 26.0–34.0)
MCHC: 32.3 g/dL (ref 30.0–36.0)
MCV: 95.5 fL (ref 80.0–100.0)
Monocytes Absolute: 2.8 10*3/uL — ABNORMAL HIGH (ref 0.1–1.0)
Monocytes Relative: 12 %
Neutro Abs: 18.5 10*3/uL — ABNORMAL HIGH (ref 1.7–7.7)
Neutrophils Relative %: 80 %
Platelets: 303 10*3/uL (ref 150–400)
RBC: 2.01 MIL/uL — ABNORMAL LOW (ref 4.22–5.81)
RDW: 17.1 % — ABNORMAL HIGH (ref 11.5–15.5)
WBC: 23.1 10*3/uL — ABNORMAL HIGH (ref 4.0–10.5)
nRBC: 0 % (ref 0.0–0.2)

## 2019-06-22 LAB — AEROBIC/ANAEROBIC CULTURE W GRAM STAIN (SURGICAL/DEEP WOUND)
Culture: NO GROWTH
Gram Stain: NONE SEEN

## 2019-06-22 LAB — OCCULT BLOOD X 1 CARD TO LAB, STOOL: Fecal Occult Bld: POSITIVE — AB

## 2019-06-22 LAB — TROPONIN I (HIGH SENSITIVITY)
Troponin I (High Sensitivity): 59 ng/L — ABNORMAL HIGH (ref <18)
Troponin I (High Sensitivity): 58 ng/L — ABNORMAL HIGH (ref <18)
Troponin I (High Sensitivity): 55 ng/L — ABNORMAL HIGH (ref <18)

## 2019-06-22 LAB — HEMOGLOBIN AND HEMATOCRIT, BLOOD
HCT: 23.2 % — ABNORMAL LOW (ref 39.0–52.0)
HCT: 21.2 % — ABNORMAL LOW (ref 39.0–52.0)
Hemoglobin: 7.4 g/dL — ABNORMAL LOW (ref 13.0–17.0)
Hemoglobin: 7 g/dL — ABNORMAL LOW (ref 13.0–17.0)

## 2019-06-22 LAB — PREPARE RBC (CROSSMATCH)

## 2019-06-22 MED ORDER — SODIUM CHLORIDE 0.9 % IV BOLUS
500.0000 mL | Freq: Once | INTRAVENOUS | Status: AC
  Administered 2019-06-22: 500 mL via INTRAVENOUS

## 2019-06-22 MED ORDER — IOHEXOL 350 MG/ML SOLN
100.0000 mL | Freq: Once | INTRAVENOUS | Status: AC | PRN
  Administered 2019-06-22: 100 mL via INTRAVENOUS

## 2019-06-22 MED ORDER — PANTOPRAZOLE SODIUM 40 MG IV SOLR
40.0000 mg | INTRAVENOUS | Status: DC
  Administered 2019-06-22: 40 mg via INTRAVENOUS
  Filled 2019-06-22: qty 40

## 2019-06-22 MED ORDER — SODIUM CHLORIDE 0.9% IV SOLUTION
Freq: Once | INTRAVENOUS | Status: AC
  Administered 2019-06-22: 13:00:00 via INTRAVENOUS

## 2019-06-22 MED ORDER — NITROGLYCERIN 0.4 MG SL SUBL: 0.4000 mg | SUBLINGUAL_TABLET | SUBLINGUAL | Status: DC | PRN

## 2019-06-22 NOTE — Progress Notes (Signed)
PROGRESS NOTE    Frank Fuller  [id number] DOB: 03-18-1961 DOA: 06/13/2019 PCP: [facility]   Brief Narrative:  Patient is a 58 year old male with history of hypertension, ESRD on dialysis( Tuesday,Thursday,Saturday), abdominal aortic aneurysm status post stent, hepatitis C virus status post treatment, depression, CVA who initially presented to [facility] with multiple complaints.  He was recently hospitalized from 8/11-8/20 for dissection of thoracic aorta(type B) and was treated with endovascular stent graft with spinal drain by Dr. Oneida Alar on 8/14.  He was discharged home but immediately came back with complaints of urge to use the restroom but unable to do so.  He also had fever.  He was found to have paraplegia.  He was suspected to have thoracic spinal ischemia.  CT imaging also showed possible osteomyelitis/discitis of L4-L5.  Also suspected to have sepsis secondary to possible pneumonia. Underwent disc aspiration by IR on 06/18/2019.  Neurosurgery, vascular surgery, ID were following.  Patient had several episodes of bloody bowel movement with clots since yesterday evening.  GI consulted today.   Assessment & Plan:   Principal Problem:   Lumbar discitis Active Problems:   ESRD on hemodialysis (HCC)   Ileus (HCC)   Proctitis   H/O endovascular stent graft for abdominal aortic aneurysm   Suspected sepsis secondary to pneumonia discitis/osteomyelitis: Presented with fever of 102F, leukocytosis.  CRP/procalcitonin was elevated.  CT also showed possible right-sided pneumonia with Iileus and proctitis.  Cultures from [facility] did not show any growth.  Cultures sent here has not shown any growth yet.  Started on broad-spectrum antibiotics with vancomycin and cefepime.  Currently is afebrile.   MRI lumbar spine showed new space-occupying material in the lower retroperitoneum and lumbosacral junction.  Favored acute discitis and osteomyelitis at L4-L5 with pervertebral  phlegmon.  Neurosurgery , interventional radiology consulted .S/P disc aspiration.Culture negative so far. Infectious disease was following and recommended 6 weeks of antibiotics with dialysis.  Cultures have remained negative.  GI bleed: Patient had several episodes of bloody bowel movement with clots since yesterday evening.  GI consulted today.  FOBT positive.  Continue to monitor H&H.  Currently hemodynamically stable.  Last CBC showed stable hemoglobin.  Aspirin on hold.  Hypoxia:On presentation he was  noted to be hypoxic with desaturation on room air.Pneumonia suspected.  Continue supplemental oxygen.  CT had shown left pleural effusion, proBNP was elevated.  Continue volume control with dialysis.Last chest x-ray shows left base atelectasis.  We will try to taper off oxygen.  Abdomen pain/acute ileus/proctitis: Noted on CT imaging at outside hospital showing signs of ileus with large stool burden and inflammation of rectum.  Denies any abdominal pain. Tolerating diet. No obstruction as per last abdominal x-ray.  ESRD on dialysis:ESRD on dialysis( Tuesday,Thursday,Saturday).  Nephrology following.  Dialysed today.  Anemia of chronic disease: Associated with ESRD.  Currently CBC stable.  Continue to monitor.  Left upper extremity swelling: Doppler ultrasound did not show any DVT.  Continue to monitor  Thoracic aortic dissection: Status post endovascular stent placement.  Vascular surgery was following  Hypertension: Currently blood pressure stable.  Continue current medicines .Continue to monitor blood pressure.  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  Abnormal spinal cord at T9-T10, T10-11, possible segmental cord infarct.  Neurosurgery consulted and  signed off.  No plan for intervention. Recommended daily baby aspirin by neurology.  DVT prophylaxis: SCD Code Status: Full Family Communication: None present at the bedside Disposition Plan: CIR after GI workup  Consultants: Nephrology, ID, vascular surgery  Procedures: Dialysis, disc aspiration Antimicrobials:  Anti-infectives (From admission, onward)   Start     Dose/Rate Route Frequency Ordered Stop   06/21/19 1200  vancomycin (VANCOCIN) IVPB 1000 mg/200 mL premix     1,000 mg 200 mL/hr over 60 Minutes Intravenous Every T-Th-Sa (Hemodialysis) 06/20/19 0800     06/18/19 0808  vancomycin variable dose per unstable renal function (pharmacist dosing)  Status:  Discontinued      Does not apply See admin instructions 06/18/19 0808 06/20/19 0805   06/14/19 1800  ceFEPIme (MAXIPIME) 2 g in sodium chloride 0.9 % 100 mL IVPB     2 g 200 mL/hr over 30 Minutes Intravenous Every T-Th-Sa (1800) 06/13/19 1540     06/14/19 1200  vancomycin (VANCOCIN) IVPB 1000 mg/200 mL premix  Status:  Discontinued     1,000 mg 200 mL/hr over 60 Minutes Intravenous Every T-Th-Sa (Hemodialysis) 06/13/19 1540 06/18/19 0808   06/13/19 1545  vancomycin (VANCOCIN) 2,000 mg in sodium chloride 0.9 % 500 mL IVPB     2,000 mg 250 mL/hr over 120 Minutes Intravenous  Once 06/13/19 1519 06/13/19 1829   06/13/19 1545  ceFEPIme (MAXIPIME) 2 g in sodium chloride 0.9 % 100 mL IVPB     2 g 200 mL/hr over 30 Minutes Intravenous  Once 06/13/19 1519 06/13/19 2241      Subjective:  Patient seen and examined the bedside this morning.  Hemodynamically stable.  Comfortable.  Denies any abdominal pain.  He had 2 or 3 episodes of bloody bowel movement with clots since yesterday.  No active bleeding.  Objective: Vitals:   06/21/19 2000 06/22/19 0514 06/22/19 1029 06/22/19 1032  BP: (!) 171/95 (!) 102/55 (!) 94/49 (!) 90/53  Pulse: (!) 114 100 79 81  Resp:  18 18   Temp: 98.8 F (37.1 C) 97.9 F (36.6 C) 98.8 F (37.1 C)   TempSrc: Oral Oral Oral   SpO2: 99% 98% 99%   Weight:      Height:         Intake/Output Summary (Last 24 hours) at 06/22/2019 1053 Last data filed at 06/21/2019 2150 Gross per 24 hour  Intake 493 ml  Output 2000 ml  Net -1507 ml   Filed Weights   06/20/19 2108 06/21/19 0633 06/21/19 1134  Weight: 93 kg 93.4 kg 91.4 kg    Examination:   General exam: Appears calm and comfortable ,Not in distress,average built HEENT:PERRL,Oral mucosa moist, Ear/Nose normal on gross exam Respiratory system: Decreased air entry on the left side Cardiovascular system: S1 & S2 heard, RRR. No JVD, murmurs, rubs, gallops or clicks. Gastrointestinal system: Abdomen is nondistended, soft and nontender. No organomegaly or masses felt. Normal bowel sounds heard. Central nervous system: Alert and oriented.  Extremities: No edema, no clubbing ,no cyanosis, distal peripheral pulses palpable. Skin: No rashes, lesions or ulcers,no icterus ,no pallor Data Reviewed: I have personally reviewed following labs and imaging studies  CBC: Recent Labs  Lab 06/16/19 0617 06/17/19 0700 06/18/19 0557 06/19/19 0448 06/21/19 0813 06/21/19 2009  WBC 27.2* 19.6* 13.3* 13.3* 7.6  --   NEUTROABS  --   --   --  8.9*  --   --   HGB 8.3* 8.1* 8.0* 8.0* 7.7* 8.2*  HCT 24.8* 24.5* 24.5* 24.7* 23.9* 26.2*  MCV 93.2 94.6 95.0 95.7 94.5  --  PLT 375 437* 441* 454* 341  --    Basic Metabolic Panel: Recent Labs  Lab 06/16/19 0617 06/17/19 0534 06/18/19 0557 06/19/19 0448 06/21/19 0812  NA 130* 128* 133* 130* 132*  K 4.2 4.6 4.6 4.5 3.4*  CL 91* 90* 95* 92* 94*  CO2 24 23 25 22 25   GLUCOSE 116* 109* 110* 102* 94  BUN 59* 77* 55* 72* 39*  CREATININE 10.50* 12.29* 9.65* 11.28* 6.99*  CALCIUM 8.7* 8.6* 8.8* 9.1 8.7*  PHOS 5.0*  --   --  4.9* 3.1   GFR: Estimated Creatinine Clearance: 13.8 mL/min (A) (by C-G formula based on SCr of 6.99 mg/dL (H)). Liver Function Tests: Recent Labs  Lab 06/16/19 0617 06/21/19 0812  ALBUMIN 1.9* 1.9*   No results for input(s): LIPASE, AMYLASE in the  last 168 hours. No results for input(s): AMMONIA in the last 168 hours. Coagulation Profile: Recent Labs  Lab 06/17/19 0534  INR 1.3*   Cardiac Enzymes: No results for input(s): CKTOTAL, CKMB, CKMBINDEX, TROPONINI in the last 168 hours. BNP (last 3 results) No results for input(s): PROBNP in the last 8760 hours. HbA1C: No results for input(s): HGBA1C in the last 72 hours. CBG: Recent Labs  Lab 06/15/19 1708 06/15/19 2119 06/15/19 2358 06/16/19 0353 06/16/19 0733  GLUCAP 109* 127* 117* 129* 116*   Lipid Profile: No results for input(s): CHOL, HDL, LDLCALC, TRIG, CHOLHDL, LDLDIRECT in the last 72 hours. Thyroid Function Tests: No results for input(s): TSH, T4TOTAL, FREET4, T3FREE, THYROIDAB in the last 72 hours. Anemia Panel: No results for input(s): VITAMINB12, FOLATE, FERRITIN, TIBC, IRON, RETICCTPCT in the last 72 hours. Sepsis Labs: No results for input(s): PROCALCITON, LATICACIDVEN in the last 168 hours.  Recent Results (from the past 240 hour(s))  Culture, blood (routine x 2)     Status: None   Collection Time: 06/15/19 11:20 AM   Specimen: BLOOD LEFT HAND  Result Value Ref Range Status   Specimen Description BLOOD LEFT HAND  Final   Special Requests   Final    BOTTLES DRAWN AEROBIC ONLY Blood Culture results may not be optimal due to an inadequate volume of blood received in culture bottles   Culture   Final    NO GROWTH 5 DAYS Performed at [facility], [place] [address] [place], [place] [address]    Report Status 06/20/2019 FINAL  Final  Culture, blood (routine x 2)     Status: None   Collection Time: 06/15/19 11:28 AM   Specimen: BLOOD  Result Value Ref Range Status   Specimen Description BLOOD LEFT ANTECUBITAL  Final   Special Requests   Final    BOTTLES DRAWN AEROBIC ONLY Blood Culture results may not be optimal due to an inadequate volume of blood received in culture bottles   Culture   Final    NO GROWTH 5 DAYS Performed at [place], [place] [address], [place], [place] [address]    Report Status 06/20/2019 FINAL  Final  Aerobic/Anaerobic Culture (surgical/deep wound)     Status: None (Preliminary result)   Collection Time: 06/17/19  3:12 PM   Specimen: Intervertebral Disc; Tissue  Result Value Ref Range Status   Specimen Description WOUND  Final   Special Requests L4 L5 DISC ASPIRATION  Final   Gram Stain NO WBC SEEN NO ORGANISMS SEEN   Final   Culture   Final    NO GROWTH 4 DAYS NO ANAEROBES ISOLATED; CULTURE IN PROGRESS FOR 5 DAYS  Performed at [facility], [place] [address], [place], [place] [address]    Report Status PENDING  Incomplete         Radiology Studies: Ct Head Wo Contrast  Result Date: 06/21/2019 CLINICAL DATA:  58 year old male with new onset seizure. EXAM: CT HEAD WITHOUT CONTRAST TECHNIQUE: Contiguous axial images were obtained from the base of the skull through the vertex without intravenous contrast. COMPARISON:  01/10/2019 CT and prior studies FINDINGS: Brain: No evidence of acute infarction, hemorrhage, hydrocephalus, extra-axial collection or mass lesion/mass effect. Chronic small-vessel white matter ischemic changes and remote LEFT basal ganglia infarct again noted. Vascular: Carotid and vertebral atherosclerotic calcifications noted. Skull: Normal. Negative for fracture or focal lesion. Sinuses/Orbits: No acute finding. Other: None. IMPRESSION: 1. No evidence of acute intracranial abnormality. 2. Chronic small-vessel white matter ischemic changes and remote LEFT basal ganglia infarct. Electronically Signed   By: Margarette Canada M.D.   On: 06/21/2019 21:37   Dg Abd Portable 1v  Result Date: 06/21/2019 CLINICAL DATA:  Abdominal pain. EXAM: PORTABLE ABDOMEN - 1 VIEW COMPARISON:  06/13/2019 FINDINGS: A large amount of colonic stool is again noted. No dilated small bowel loops are present. Aortic graft again noted. IMPRESSION: Large amount of colonic stool. No evidence of small bowel obstruction.  Electronically Signed   By: Margarette Canada M.D.   On: 06/21/2019 20:35        Scheduled Meds:  amLODipine  10 mg Oral QHS   atorvastatin  20 mg Oral q1800   darbepoetin (ARANESP) injection - DIALYSIS  100 mcg Intravenous Q Sat-HD   ferric citrate  630 mg Oral TID WC   hydrALAZINE  100 mg Oral TID   labetalol  300 mg Oral BID   losartan  50 mg Oral BID   minoxidil  2.5 mg Oral BID   polyethylene glycol  17 g Oral Daily   sodium chloride flush  3 mL Intravenous Q12H   Continuous Infusions:  ceFEPime (MAXIPIME) IV Stopped (06/21/19 1837)   vancomycin 1,000 mg (06/21/19 1020)     LOS: 9 days    Time spent:35 mins. More than 50% of that time was spent in counseling and/or coordination of care.      Shelly Coss, MD [facility] Pager [phone]  If 7PM-7AM, please contact night-coverage [email or web address] Password [email or web address] 06/22/2019, 10:53 AM

## 2019-06-22 NOTE — Significant Event (Addendum)
Patient became hypotensive at around 11 AM today.  Antihypertensive medicines have been held, he was given a bolus of 500 mL.  Blood pressure has been stable after that.  Patient was also complaining of chest pain.  EKG did not show any significant ST changes.  Mild elevation in troponin most likely secondary to his end-stage renal disease.  Will check another troponin at around 7 PM.  CT angio abdomen/pelvis  did not show any active bleeding.  We will continue to monitor H&H. Currently patient has been resting.No complains of chest or abdominal pain. Vital signs have been stable.GI recommending to keep him n.p.o. after midnight.

## 2019-06-22 NOTE — Progress Notes (Signed)
[facility] Progress Note   Subjective:   Patient seen and examined at bedside.  Reports bloody BM last night and n/v this AM.  GI consulted.  No issues with dialysis, tolerated well.  Objective Vitals:   06/22/19 0514 06/22/19 1029 06/22/19 1032 06/22/19 1112  BP: (!) 102/55 (!) 94/49 (!) 90/53 (!) 87/45  Pulse: 100 79 81   Resp: 18 18  18   Temp: 97.9 F (36.6 C) 98.8 F (37.1 C)  98 F (36.7 C)  TempSrc: Oral Oral  Oral  SpO2: 98% 99%  99%  Weight:      Height:       Physical Exam General:NAD, well appearing male, laying in bed Heart:RRR Lungs:mostly CTAB, +scattered rhonchi on R Abdomen:soft, NTND Extremities:no LE edema Dialysis Access: R IJ [facility]   Filed Weights   06/20/19 2108 06/21/19 0633 06/21/19 1134  Weight: 93 kg 93.4 kg 91.4 kg    Intake/Output Summary (Last 24 hours) at 06/22/2019 1113 Last data filed at 06/21/2019 2150 Gross per 24 hour  Intake 493 ml  Output 0 ml  Net 493 ml    Additional Objective Labs: Basic Metabolic Panel: Recent Labs  Lab 06/16/19 0617  06/18/19 0557 06/19/19 0448 06/21/19 0812  NA 130*   < > 133* 130* 132*  K 4.2   < > 4.6 4.5 3.4*  CL 91*   < > 95* 92* 94*  CO2 24   < > 25 22 25   GLUCOSE 116*   < > 110* 102* 94  BUN 59*   < > 55* 72* 39*  CREATININE 10.50*   < > 9.65* 11.28* 6.99*  CALCIUM 8.7*   < > 8.8* 9.1 8.7*  PHOS 5.0*  --   --  4.9* 3.1   < > = values in this interval not displayed.   Liver Function Tests: Recent Labs  Lab 06/16/19 0617 06/21/19 0812  ALBUMIN 1.9* 1.9*   No results for input(s): LIPASE, AMYLASE in the last 168 hours. CBC: Recent Labs  Lab 06/16/19 0617 06/17/19 0700 06/18/19 0557 06/19/19 0448 06/21/19 0813 06/21/19 2009  WBC 27.2* 19.6* 13.3* 13.3* 7.6  --   NEUTROABS  --   --   --  8.9*  --   --   HGB 8.3* 8.1* 8.0* 8.0* 7.7* 8.2*  HCT 24.8* 24.5* 24.5* 24.7* 23.9* 26.2*  MCV 93.2 94.6 95.0 95.7 94.5  --   PLT 375 437* 441* 454* 341  --    CBG: Recent  Labs  Lab 06/15/19 1708 06/15/19 2119 06/15/19 2358 06/16/19 0353 06/16/19 0733  GLUCAP 109* 127* 117* 129* 116*   Studies/Results: Ct Head Wo Contrast  Result Date: 06/21/2019 CLINICAL DATA:  58 year old male with new onset seizure. EXAM: CT HEAD WITHOUT CONTRAST TECHNIQUE: Contiguous axial images were obtained from the base of the skull through the vertex without intravenous contrast. COMPARISON:  01/10/2019 CT and prior studies FINDINGS: Brain: No evidence of acute infarction, hemorrhage, hydrocephalus, extra-axial collection or mass lesion/mass effect. Chronic small-vessel white matter ischemic changes and remote LEFT basal ganglia infarct again noted. Vascular: Carotid and vertebral atherosclerotic calcifications noted. Skull: Normal. Negative for fracture or focal lesion. Sinuses/Orbits: No acute finding. Other: None. IMPRESSION: 1. No evidence of acute intracranial abnormality. 2. Chronic small-vessel white matter ischemic changes and remote LEFT basal ganglia infarct. Electronically Signed   By: Margarette Canada M.D.   On: 06/21/2019 21:37   Dg Abd Portable 1v  Result Date: 06/21/2019 CLINICAL DATA:  Abdominal  pain. EXAM: PORTABLE ABDOMEN - 1 VIEW COMPARISON:  06/13/2019 FINDINGS: A large amount of colonic stool is again noted. No dilated small bowel loops are present. Aortic graft again noted. IMPRESSION: Large amount of colonic stool. No evidence of small bowel obstruction. Electronically Signed   By: Margarette Canada M.D.   On: 06/21/2019 20:35    Medications: . ceFEPime (MAXIPIME) IV Stopped (06/21/19 1837)  . sodium chloride    . vancomycin 1,000 mg (06/21/19 1020)   . atorvastatin  20 mg Oral q1800  . darbepoetin (ARANESP) injection - DIALYSIS  100 mcg Intravenous Q Sat-HD  . ferric citrate  630 mg Oral TID WC  . polyethylene glycol  17 g Oral Daily  . sodium chloride flush  3 mL Intravenous Q12H    Dialysis Orders: [facility]  4h 40min 400/1.5 87.5kg 2/2.25 RIJ TDC Hep 3000  -  Parsabiv 2.5mg  IV q HD  - Calcitriol 1.98mcg PO q HD  Assessment/Plan: 1. Fever/leukocytosis: s/p recent aortic stent and TDC.  Possible PNA & proctitis on initial CT completed at [facility].  BC neg from [place], repeat 8/23 w/NGTD.  On Vanc/Cefepime.  WBC improving. Afebrile 2. L4-5 lumbar diskitis: suspected cause of #1.  On Vanc/Cefepime x6wks per ID.  IR aspiration pending.  3. Spinal cord infarct: per spinal MRI 8/23, after complaints of not being able to move legs.  Neurosurgery consulted, no plan for intervention, recommend daily ASA.  CIR to evaluate for possible placement.  4. Hx Type B aortic dissection (s/p stent graft repair 8/14) - goal SBP per VVS 120-170.  Known severe resistant HTN.  Trying to avoid BP drops on HD.  On ASA. 5. ESRD - on HD TTS. HD yesterday tolerated well.  Next HD on 9/1. 6. Anemia of CKD-  Hgb 8.2, follow. Continue aranesp 148mcg qwk 7. Secondary hyperparathyroidism - Ca/phos in goal. Continue auryxia and calcitriol.  Parsabiv not on hospital formulary. 8. HTN/volume - BP soft this AM, HTN meds stopped. Does not appear fluid overloaded yet weights show he is over EDW, doubt accuracy.  Titrate down as tolerated.  9. Nutrition - Renal diet w/fluid restrictions. Alb 1.9. protein supplements, vit 10. Recent MSSA bacteremia/AVG infection - Hx infected AVG s/p revision in 5/20, then partial resection in 6/20, then all AVG material (x2) removed 6/30. S/p 6 weeks Ancef completed. 11. Hematochezia w/nausea and vomiting - Started yesterday.  +FOBT, GI consulting  Jen Mow, PA-C [facility] Pager: [phone] 06/22/2019,11:13 AM  LOS: 9 days

## 2019-06-22 NOTE — Progress Notes (Signed)
PT Cancellation Note  Patient Details Name: Frank Fuller MRN: [id number] DOB: 1960/11/12   Cancelled Treatment:    Reason Eval/Treat Not Completed: Other (comment)   Nauseated; Will continue to follow,   [name], PT  Acute Rehabilitation Services Pager [phone] Office [place] 06/22/2019, 10:08 AM

## 2019-06-22 NOTE — Significant Event (Signed)
Rapid Response Event Note  I came back to [name], he was awake and conversant, first unit of PRBCs were infusing, SBP improved to 100-110s MAPs >  65. GI ordered CT A/P with contrast. I went down with the patient the and got the CT scan done. Tolerated transport.   Plan: -- Same as before --   [name] R
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
## 2019-06-22 NOTE — Progress Notes (Signed)
Pharmacy Antibiotic Note  Frank Fuller is a 58 y.o. male admitted on 06/13/2019 with lumbar discitis. Pharmacy has been consulted to dose vancomycin and cefepime.   Day #10 of vancomycin and cefepime. ID recommends to continue for 6 weeks total therapy ending 07/29/19. The patient successfully underwent 4 hours and 15 minutes of HD with a BFR of 400 on 8/29. WBC 13.3. Tmax/24hr 99.7.    Plan: - Continue vancomycin 1g IV qHD and cefepime 2g IV qHD TTS - Monitor CBC, C/S, clinical progress, vancomycin levels as needed  Height: 6\' 4"  (193 cm) Weight: 201 lb 8 oz (91.4 kg) IBW/kg (Calculated) : 86.8  Temp (24hrs), Avg:98.3 F (36.8 C), Min:97.5 F (36.4 C), Max:98.8 F (37.1 C)  Recent Labs  Lab 06/16/19 0617 06/17/19 0534 06/17/19 0700 06/18/19 0557 06/19/19 0448 06/21/19 0812 06/21/19 0813 06/22/19 1101  WBC 27.2*  --  19.6* 13.3* 13.3*  --  7.6 23.1*  CREATININE 10.50* 12.29*  --  9.65* 11.28* 6.99*  --   --   VANCORANDOM  --   --   --  34  --   --   --   --     Estimated Creatinine Clearance: 13.8 mL/min (A) (by C-G formula based on SCr of 6.99 mg/dL (H)).    Allergies  Allergen Reactions  . Oxycodone Nausea Only  . Chlorhexidine Other (See Comments)    Unknown reaction Patch skin test done at dialysis 06/26/17  - staff using clear dressing and alcohol to clean exit site of catheter  . Clonidine Derivatives Other (See Comments)    Dizziness   . Lisinopril Other (See Comments)    unresponsive  . Carvedilol Rash    Antimicrobials this admission: Vanc 8/21 >> 07/29/19 Cefepime 8/21 >> 07/29/19  Microbiology results: 8/23 BCx >> ngtd 8/25 IR disc aspirate >>NGTD 8/25 fluid culture sent.  Thank you for allowing pharmacy to be a part of this patient's care.  Agnes Lawrence, PharmD PGY1 Pharmacy Resident

## 2019-06-22 NOTE — Significant Event (Addendum)
Rapid Response Event Note  Overview: Hypotension and Lethargy S/T GIB  Initial Focused Assessment: Called by nursing staff to assess patient for increased lethargy and hypotension. Per nursing staff, SBP dropped into the 80s, patient is lethargic now per staff. Upon arrival, Mr. Ebarb was sleeping, awoke to my voice, would answer some questions and not others, his behavior/demeanor is questionable, he went from being sleepy to agitated - I think he is acting out at times for attention, just my observation. Skin was slightly clammy, SBP now > 100, HR in the 70s, patient receiving 500 cc NS bolus that the MD ordered. Lung sounds clear, not in acute distress, + 2 pulses in all extremities. H/H resulted 6.2/19.2 (lower from 8.2/26.2). Patient complained off not being able to move his legs (not new - has had BLE weakness ? Ischemia after thoracic aneurysm was stented - see notes from [facility]VS and NS MD) - did respond to painful/noxious stimuli in BLE.   Interventions: -- PIV 22G x 1 inserted (patient only had 1 PIV) -- MD ordered PRBC x 1 unit   Plan of Care: -- Monitor VS frequently and as ordered -- Monitor for signs of shock - AMS, skin changes, low BP, etc -- Currently patient's BP has improved, if BP does not remain stable after blood transfusion or patient's is still having BBMs - recommend transfer to PCU.   Event Summary:  Start Time 1137 Arrival Time 1142 End Time 1235  Jaloni Davoli R

## 2019-06-22 NOTE — Plan of Care (Signed)
  Problem: Education: Goal: Knowledge of General Education information will improve Description: Including pain rating scale, medication(s)/side effects and non-pharmacologic comfort measures Outcome: Progressing   Problem: Clinical Measurements: Goal: Diagnostic test results will improve Outcome: Progressing   Problem: Elimination: Goal: Will not experience complications related to bowel motility Outcome: Progressing   Problem: Elimination: Goal: Will not experience complications related to urinary retention Outcome: Progressing

## 2019-06-22 NOTE — Progress Notes (Signed)
Pt had another large bloody stool with clots and mucus vital signs 116/68 ,99%  2lit [place], 82 heart rate, temp 98.9 Bodenheimer NP notified will continue to monitor.

## 2019-06-23 ENCOUNTER — Telehealth: Payer: Self-pay | Admitting: Internal Medicine

## 2019-06-23 LAB — CBC WITH DIFFERENTIAL/PLATELET
Abs Immature Granulocytes: 0.39 10*3/uL — ABNORMAL HIGH (ref 0.00–0.07)
Basophils Absolute: 0.1 10*3/uL (ref 0.0–0.1)
Basophils Relative: 0 %
Eosinophils Absolute: 0.3 10*3/uL (ref 0.0–0.5)
Eosinophils Relative: 1 %
HCT: 20.9 % — ABNORMAL LOW (ref 39.0–52.0)
Hemoglobin: 6.8 g/dL — CL (ref 13.0–17.0)
Immature Granulocytes: 2 %
Lymphocytes Relative: 5 %
Lymphs Abs: 0.9 10*3/uL (ref 0.7–4.0)
MCH: 29.8 pg (ref 26.0–34.0)
MCHC: 32.5 g/dL (ref 30.0–36.0)
MCV: 91.7 fL (ref 80.0–100.0)
Monocytes Absolute: 2.3 10*3/uL — ABNORMAL HIGH (ref 0.1–1.0)
Monocytes Relative: 11 %
Neutro Abs: 17.2 10*3/uL — ABNORMAL HIGH (ref 1.7–7.7)
Neutrophils Relative %: 81 %
Platelets: 291 10*3/uL (ref 150–400)
RBC: 2.28 MIL/uL — ABNORMAL LOW (ref 4.22–5.81)
RDW: 18.1 % — ABNORMAL HIGH (ref 11.5–15.5)
WBC: 21.1 10*3/uL — ABNORMAL HIGH (ref 4.0–10.5)
nRBC: 0 % (ref 0.0–0.2)

## 2019-06-23 LAB — BASIC METABOLIC PANEL
Anion gap: 13 (ref 5–15)
BUN: 52 mg/dL — ABNORMAL HIGH (ref 6–20)
CO2: 24 mmol/L (ref 22–32)
Calcium: 8.9 mg/dL (ref 8.9–10.3)
Chloride: 96 mmol/L — ABNORMAL LOW (ref 98–111)
Creatinine, Ser: 8.82 mg/dL — ABNORMAL HIGH (ref 0.61–1.24)
GFR calc Af Amer: 7 mL/min — ABNORMAL LOW (ref >60)
GFR calc non Af Amer: 6 mL/min — ABNORMAL LOW (ref >60)
Glucose, Bld: 134 mg/dL — ABNORMAL HIGH (ref 70–99)
Potassium: 4.5 mmol/L (ref 3.5–5.1)
Sodium: 133 mmol/L — ABNORMAL LOW (ref 135–145)

## 2019-06-23 LAB — HEMOGLOBIN AND HEMATOCRIT, BLOOD
HCT: 25 % — ABNORMAL LOW (ref 39.0–52.0)
Hemoglobin: 8.2 g/dL — ABNORMAL LOW (ref 13.0–17.0)

## 2019-06-23 LAB — PREPARE RBC (CROSSMATCH)

## 2019-06-23 MED ORDER — SODIUM CHLORIDE 0.9% IV SOLUTION
Freq: Once | INTRAVENOUS | Status: AC
  Administered 2019-06-23: 09:00:00 via INTRAVENOUS

## 2019-06-23 MED ORDER — FERRIC CITRATE 1 GM 210 MG(FE) PO TABS: 630.0000 mg | ORAL_TABLET | Freq: Three times a day (TID) | ORAL | Status: DC

## 2019-06-23 MED ORDER — HEPARIN SODIUM (PORCINE) 1000 UNIT/ML IJ SOLN
INTRAMUSCULAR | Status: AC
  Filled 2019-06-23: qty 4

## 2019-06-23 MED ORDER — POLYETHYLENE GLYCOL 3350 17 G PO PACK
17.0000 g | PACK | Freq: Every day | ORAL | Status: DC
  Administered 2019-06-25 – 2019-07-02 (×8): 17 g via ORAL
  Filled 2019-06-23 (×10): qty 1

## 2019-06-23 MED ORDER — POLYETHYLENE GLYCOL 3350 17 G PO PACK
17.0000 g | PACK | ORAL | Status: AC
  Administered 2019-06-23 (×2): 17 g via ORAL
  Filled 2019-06-23: qty 1

## 2019-06-23 MED ORDER — PANTOPRAZOLE SODIUM 40 MG PO TBEC
40.0000 mg | DELAYED_RELEASE_TABLET | Freq: Every day | ORAL | Status: DC
  Administered 2019-06-23 – 2019-06-26 (×4): 40 mg via ORAL
  Filled 2019-06-23 (×4): qty 1

## 2019-06-23 MED ORDER — MORPHINE SULFATE (PF) 4 MG/ML IV SOLN
INTRAVENOUS | Status: AC
  Administered 2019-06-23: 4 mg
  Filled 2019-06-23: qty 1

## 2019-06-23 MED ORDER — BISACODYL 10 MG RE SUPP
10.0000 mg | Freq: Once | RECTAL | Status: AC
  Administered 2019-06-23: 10 mg via RECTAL
  Filled 2019-06-23 (×2): qty 1

## 2019-06-23 NOTE — Anesthesia Preprocedure Evaluation (Addendum)
Anesthesia Evaluation  Patient identified by MRN, date of birth, ID band Patient awake    Reviewed: Allergy & Precautions, NPO status , Patient's Chart, lab work & pertinent test results, reviewed documented beta blocker date and time   History of Anesthesia Complications Negative for: history of anesthetic complications  Airway Mallampati: II  TM Distance: >3 FB Neck ROM: Full    Dental no notable dental hx.    Pulmonary Current Smoker,    Pulmonary exam normal        Cardiovascular hypertension, Pt. on medications and Pt. on home beta blockers Normal cardiovascular exam+ dysrhythmias (Second degree AVB-Mobitz type 1)   TTE 04/18/19: EF 55-60%, normal valves   Neuro/Psych Depression CVA (2018), No Residual Symptoms    GI/Hepatic (+) Hepatitis -, C  Endo/Other    Renal/GU ESRF and DialysisRenal disease (on HD T/Th/Sa)     Musculoskeletal  (+) Arthritis ,   Abdominal   Peds  Hematology  (+) anemia ,   Anesthesia Other Findings   Reproductive/Obstetrics                            Anesthesia Physical Anesthesia Plan  ASA: III  Anesthesia Plan: MAC   Post-op Pain Management:    Induction:   PONV Risk Score and Plan: 0 and Treatment may vary due to age or medical condition and Propofol infusion  Airway Management Planned: Natural Airway and Nasal Cannula  Additional Equipment:   Intra-op Plan:   Post-operative Plan:   Informed Consent: I have reviewed the patients History and Physical, chart, labs and discussed the procedure including the risks, benefits and alternatives for the proposed anesthesia with the patient or authorized representative who has indicated his/her understanding and acceptance.     Dental advisory given  Plan Discussed with: CRNA  Anesthesia Plan Comments:        Anesthesia Quick Evaluation

## 2019-06-23 NOTE — Progress Notes (Signed)
PROGRESS NOTE    Frank Fuller  [id number] DOB: 1961/09/15 DOA: 06/13/2019 PCP: [facility]   Brief Narrative:  Patient is a 58 year old male with history of hypertension, ESRD on dialysis( Tuesday,Thursday,Saturday), abdominal aortic aneurysm status post stent, hepatitis C virus status post treatment, depression, CVA who initially presented to [facility] with multiple complaints.  He was recently hospitalized from 8/11-8/20 for dissection of thoracic aorta(type B) and was treated with endovascular stent graft with spinal drain by Dr. Oneida Alar on 8/14.  He was discharged home but immediately came back with complaints of urge to use the restroom but unable to do so.  He also had fever.  He was found to have paraplegia.  He was suspected to have thoracic spinal ischemia.  CT imaging also showed possible osteomyelitis/discitis of L4-L5.  Also suspected to have sepsis secondary to possible pneumonia. Underwent disc aspiration by IR on 06/18/2019.  Neurosurgery, vascular surgery, ID were following.   Hospital course remarkable for multiple  episodes of bloody bowel movement with clots .FoBT positive. Acute blood loss anemia. Plan for EGD and flexible sigmoidoscopy tomorrow.   Assessment & Plan:   Principal Problem:   Lumbar discitis Active Problems:   ESRD on hemodialysis (HCC)   Ileus (HCC)   Proctitis   H/O endovascular stent graft for abdominal aortic aneurysm   Suspected sepsis secondary to pneumonia discitis/osteomyelitis: Presented with fever of 102F, leukocytosis.  CRP/procalcitonin was elevated.  CT also showed possible right-sided pneumonia with Iileus and proctitis.  Cultures from [facility] did not show any growth.  Cultures sent here has not shown any growth yet.  Started on broad-spectrum antibiotics with vancomycin and cefepime.  Currently is afebrile.   MRI lumbar spine showed new space-occupying material in the lower retroperitoneum and lumbosacral junction.   Favored acute discitis and osteomyelitis at L4-L5 with pervertebral phlegmon.  Neurosurgery , interventional radiology consulted .S/P disc aspiration.Culture negative so far. Infectious disease was following and recommended 6 weeks of antibiotics with dialysis.  Cultures have remained negative.  GI bleed: Patient had several episodes of bloody bowel movement with clots .  FOBT positive.Currently hemodynamically stable.CT angiogram did not show any active bleeding or any other acute abnormality.  Aspirin on holdPlan for EGD and flexible sigmoidoscopy tomorrow.  Acute on chronic normocytic anemia: Secondary to GI blood loss and ESRD.  Hemoglobin this morning is 6.8.  Being transfused units of PRBC today.  Hypoxia:On presentation he was  noted to be hypoxic with desaturation on room air.Pneumonia suspected. .  CT had shown left pleural effusion, proBNP was elevated.  Continue volume control with dialysis.Last chest x-ray shows left base atelectasis.  Currently on room air  Chest pain/elevated troponin: Elevated troponin but they are flat.  Most likely secondary to supply demand ischemia due to ESRD.  Minimal chest pain today, atypical.  EKG did not show any significant ST changes  Abdomen pain/acute ileus/proctitis: Noted on CT imaging at outside hospital showing signs of ileus with large stool burden and inflammation of rectum. No obstruction as per last abdominal x-ray.CT angio as above.  Still complaining of abdomen pain today.  ESRD on dialysis:ESRD on dialysis( Tuesday,Thursday,Saturday).  Nephrology following.    Thoracic aortic dissection: Status post endovascular stent placement.  Vascular surgery was following  Hypertension: Antihypertensives will be held due to hypotension.  Will resume as appropriate.  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  Abnormal spinal cord at T9-T10, T10-11, possible segmental cord infarct.  Neurosurgery consulted and  signed off.  No plan for intervention. Recommended daily baby aspirin by neurology.        DVT prophylaxis: SCD Code Status: Full Family Communication: None present at the bedside Disposition Plan: CIR after GI workup  Consultants: Nephrology, ID, vascular surgery  Procedures: Dialysis, disc aspiration Antimicrobials:  Anti-infectives (From admission, onward)   Start     Dose/Rate Route Frequency Ordered Stop   06/21/19 1200  vancomycin (VANCOCIN) IVPB 1000 mg/200 mL premix     1,000 mg 200 mL/hr over 60 Minutes Intravenous Every T-Th-Sa (Hemodialysis) 06/20/19 0800     06/18/19 0808  vancomycin variable dose per unstable renal function (pharmacist dosing)  Status:  Discontinued      Does not apply See admin instructions 06/18/19 0808 06/20/19 0805   06/14/19 1800  ceFEPIme (MAXIPIME) 2 g in sodium chloride 0.9 % 100 mL IVPB     2 g 200 mL/hr over 30 Minutes Intravenous Every T-Th-Sa (1800) 06/13/19 1540     06/14/19 1200  vancomycin (VANCOCIN) IVPB 1000 mg/200 mL premix  Status:  Discontinued     1,000 mg 200 mL/hr over 60 Minutes Intravenous Every T-Th-Sa (Hemodialysis) 06/13/19 1540 06/18/19 0808   06/13/19 1545  vancomycin (VANCOCIN) 2,000 mg in sodium chloride 0.9 % 500 mL IVPB     2,000 mg 250 mL/hr over 120 Minutes Intravenous  Once 06/13/19 1519 06/13/19 1829   06/13/19 1545  ceFEPIme (MAXIPIME) 2 g in sodium chloride 0.9 % 100 mL IVPB     2 g 200 mL/hr over 30 Minutes Intravenous  Once 06/13/19 1519 06/13/19 2241      Subjective:  Patient seen and examined the bedside this morning.  Currently hemodynamically stable.  Looked comfortable during my evaluation but he says abdominal pain is still there.  He had a bowel movement which was nonbloody this morning.  Says he has minimal chest pain.  Respiratory status has improved.  Currently on room air   Objective: Vitals:   06/22/19 2023 06/23/19 0422 06/23/19 0858 06/23/19 0920  BP: (!) 124/56 (!) 112/48 133/62 129/60  Pulse: 89 93 90 88  Resp: 18 20 16 16   Temp: 97.7 F (36.5 C) 98.9 F (37.2 C) 98.3 F (36.8 C) 98.5 F (36.9 C)  TempSrc: Oral Oral Oral Oral  SpO2: 100% 99%  93%  Weight:      Height:        Intake/Output Summary (Last 24 hours) at 06/23/2019 1116 Last data filed at 06/23/2019 0900 Gross per 24 hour  Intake 375 ml  Output 0 ml  Net 375 ml   Filed Weights   06/20/19 2108 06/21/19 0633 06/21/19 1134  Weight: 93 kg 93.4 kg 91.4 kg    Examination:  General exam: Not in distress,average built HEENT:PERRL,Oral mucosa moist, Ear/Nose normal on gross exam Respiratory system: Bilateral equal air entry, normal vesicular breath sounds, no wheezes or crackles  Cardiovascular system: S1 & S2 heard, RRR. No JVD, murmurs, rubs, gallops or clicks.Dilaysis catheter on the chest Gastrointestinal system: Abdomen is mildly distended, soft and mild tenderness on the periumbilical and lower abdomen. No organomegaly or masses felt. Hyperactive  sounds heard. Central nervous system: Alert and oriented. No focal neurological deficits. Extremities: No edema, no clubbing ,no cyanosis, distal peripheral pulses palpable. Skin: No rashes, lesions or ulcers,no icterus ,no pallor  Data Reviewed: I have personally reviewed following labs and imaging studies  CBC: Recent  Labs  Lab 06/18/19 0557 06/19/19 0448 06/21/19 0813 06/21/19 2009 06/22/19 1101 06/22/19 1700 06/22/19 2320 06/23/19 0608  WBC 13.3* 13.3* 7.6  --  23.1*  --   --  21.1*  NEUTROABS  --  8.9*  --   --  18.5*  --   --  17.2*  HGB 8.0* 8.0* 7.7* 8.2* 6.2* 7.4* 7.0* 6.8*  HCT 24.5* 24.7* 23.9* 26.2* 19.2* 23.2* 21.2* 20.9*  MCV 95.0 95.7 94.5  --  95.5  --   --  91.7  PLT 441* 454* 341  --  303  --   --  [id number]   Basic Metabolic Panel: Recent Labs  Lab 06/17/19 0534 06/18/19 0557 06/19/19 0448 06/21/19 0812  06/23/19 0608  NA 128* 133* 130* 132* 133*  K 4.6 4.6 4.5 3.4* 4.5  CL 90* 95* 92* 94* 96*  CO2 23 25 22 25 24   GLUCOSE 109* 110* 102* 94 134*  BUN 77* 55* 72* 39* 52*  CREATININE 12.29* 9.65* 11.28* 6.99* 8.82*  CALCIUM 8.6* 8.8* 9.1 8.7* 8.9  PHOS  --   --  4.9* 3.1  --    GFR: Estimated Creatinine Clearance: 10.9 mL/min (A) (by C-G formula based on SCr of 8.82 mg/dL (H)). Liver Function Tests: Recent Labs  Lab 06/21/19 0812  ALBUMIN 1.9*   No results for input(s): LIPASE, AMYLASE in the last 168 hours. No results for input(s): AMMONIA in the last 168 hours. Coagulation Profile: Recent Labs  Lab 06/17/19 0534  INR 1.3*   Cardiac Enzymes: No results for input(s): CKTOTAL, CKMB, CKMBINDEX, TROPONINI in the last 168 hours. BNP (last 3 results) No results for input(s): PROBNP in the last 8760 hours. HbA1C: No results for input(s): HGBA1C in the last 72 hours. CBG: No results for input(s): GLUCAP in the last 168 hours. Lipid Profile: No results for input(s): CHOL, HDL, LDLCALC, TRIG, CHOLHDL, LDLDIRECT in the last 72 hours. Thyroid Function Tests: No results for input(s): TSH, T4TOTAL, FREET4, T3FREE, THYROIDAB in the last 72 hours. Anemia Panel: No results for input(s): VITAMINB12, FOLATE, FERRITIN, TIBC, IRON, RETICCTPCT in the last 72 hours. Sepsis Labs: No results for input(s): PROCALCITON, LATICACIDVEN in the last 168 hours.  Recent Results (from the past 240 hour(s))  Culture, blood (routine x 2)     Status: None   Collection Time: 06/15/19 11:20 AM   Specimen: BLOOD LEFT HAND  Result Value Ref Range Status   Specimen Description BLOOD LEFT HAND  Final   Special Requests   Final    BOTTLES DRAWN AEROBIC ONLY Blood Culture results may not be optimal due to an inadequate volume of blood received in culture bottles   Culture   Final    NO GROWTH 5 DAYS Performed at [facility], [place] [address], [place], [place] [address]    Report Status 06/20/2019 FINAL   Final  Culture, blood (routine x 2)     Status: None   Collection Time: 06/15/19 11:28 AM   Specimen: BLOOD  Result Value Ref Range Status   Specimen Description BLOOD LEFT ANTECUBITAL  Final   Special Requests   Final    BOTTLES DRAWN AEROBIC ONLY Blood Culture results may not be optimal due to an inadequate volume of blood received in culture bottles   Culture   Final    NO GROWTH 5 DAYS Performed at [facility], [place] [address] [place],  [address]    Report Status 06/20/2019 FINAL  Final  Aerobic/Anaerobic Culture (surgical/deep wound)     Status: None   Collection Time: 06/17/19  3:12 PM   Specimen: Intervertebral Disc; Tissue  Result Value Ref Range Status   Specimen Description WOUND  Final   Special Requests L4 L5 DISC ASPIRATION  Final   Gram Stain NO WBC SEEN NO ORGANISMS SEEN   Final   Culture   Final    No growth aerobically or anaerobically. Performed at [facility], [place] [address] [place], [place] [address]    Report Status 06/22/2019 FINAL  Final         Radiology Studies: Ct Head Wo Contrast  Result Date: 06/21/2019 CLINICAL DATA:  58 year old male with new onset seizure. EXAM: CT HEAD WITHOUT CONTRAST TECHNIQUE: Contiguous axial images were obtained from the base of the skull through the vertex without intravenous contrast. COMPARISON:  01/10/2019 CT and prior studies FINDINGS: Brain: No evidence of acute infarction, hemorrhage, hydrocephalus, extra-axial collection or mass lesion/mass effect. Chronic small-vessel white matter ischemic changes and remote LEFT basal ganglia infarct again noted. Vascular: Carotid and vertebral atherosclerotic calcifications noted. Skull: Normal. Negative for fracture or focal lesion. Sinuses/Orbits: No acute finding. Other: None. IMPRESSION: 1. No evidence of acute intracranial abnormality. 2. Chronic small-vessel white matter ischemic changes and remote LEFT basal ganglia infarct. Electronically Signed   By:  Margarette Canada M.D.   On: 06/21/2019 21:37   Dg Abd Portable 1v  Result Date: 06/21/2019 CLINICAL DATA:  Abdominal pain. EXAM: PORTABLE ABDOMEN - 1 VIEW COMPARISON:  06/13/2019 FINDINGS: A large amount of colonic stool is again noted. No dilated small bowel loops are present. Aortic graft again noted. IMPRESSION: Large amount of colonic stool. No evidence of small bowel obstruction. Electronically Signed   By: Margarette Canada M.D.   On: 06/21/2019 20:35   Ct Angio Abd/pel W/ And/or W/o  Result Date: 06/22/2019 CLINICAL DATA:  58 year old male with drop in hemoglobin and bilateral leg weakness. Aortic stent placed several weeks ago. EXAM: CTA ABDOMEN AND PELVIS WITHOUT AND WITH CONTRAST TECHNIQUE: Multidetector CT imaging of the abdomen and pelvis was performed using the standard protocol during bolus administration of intravenous contrast. Multiplanar reconstructed images and MIPs were obtained and reviewed to evaluate the vascular anatomy. CONTRAST:  131mL OMNIPAQUE IOHEXOL 350 MG/ML SOLN COMPARISON:  06/13/2019 CT from [facility] and other prior studies. FINDINGS: VASCULAR Aorta: A thoracic aortic stent graft is again noted without interval change or adjacent hematoma. A 3.5 cm infrarenal suprailiac abdominal aortic aneurysm is again noted without dissection or periaortic hematoma. Celiac: Patent with moderate atherosclerotic plaque at the origin. SMA: Patent with mild atherosclerotic plaque at the origin Renals: Patent with atherosclerotic plaque at the origins IMA: Patent with atherosclerotic plaque at the origin Review of the MIP images confirms the above findings. NON-VASCULAR Lower chest: No acute abnormality. Hepatobiliary: The liver and gallbladder are unremarkable. No biliary dilatation. Pancreas: Unremarkable Spleen: Unremarkable Adrenals/Urinary Tract: Mildly atrophic kidneys noted with too small to characterize renal lesions again noted. Stomach/Bowel: There is no evidence of bowel obstruction.  No definite bowel wall thickening identified. The appendix is not identified. A large amount of stool within the rectum is noted with mild circumferential rectal wall thickening. Lymphatic: No enlarged lymph nodes identified. Reproductive: No definite abnormality Other: A small amount of ascites noted as well as edema within the subcutaneous, abdominal and pelvic fat. Moderate ill-defined fluid in the presacral/precoccygeal region is noted, significantly increased from 06/13/2019. Musculoskeletal: No acute or suspicious bony abnormality. Moderate  degenerative disc disease, spondylosis and broad-based disc bulges contribute to moderate to severe central spinal and biforaminal narrowing at L4-5 and mild to moderate central spinal and biforaminal narrowing at L5-S1. IMPRESSION: VASCULAR Unchanged appearance of the visualized aorta with thoracic aortic stent graft and 3.5 cm abdominal aortic aneurysm. No evidence of aortic dissection or periaortic hematoma/hemorrhage. NON-VASCULAR Increased edema within the subcutaneous, abdominal and pelvic fat. New moderate ill-defined fluid in the presacral/pre coccygeal space most likely represents edema. Small amount of ascites. Degenerative changes within the LOWER lumbar spine contributing to moderate to severe central spinal and biforaminal narrowing at L4-5 and mild to moderate central spinal and biforaminal narrowing at L5-S1. Large amount of rectal stool. No other acute abnormality identified. Electronically Signed   By: Margarette Canada M.D.   On: 06/22/2019 16:33        Scheduled Meds: . atorvastatin  20 mg Oral q1800  . bisacodyl  10 mg Rectal Once  . darbepoetin (ARANESP) injection - DIALYSIS  100 mcg Intravenous Q Sat-HD  . [START ON 06/24/2019] ferric citrate  630 mg Oral TID WC  . pantoprazole  40 mg Oral Q0600  . polyethylene glycol  17 g Oral Q4H  . polyethylene glycol  17 g Oral Daily  . sodium chloride flush  3 mL Intravenous Q12H   Continuous Infusions:  . ceFEPime (MAXIPIME) IV Stopped (06/21/19 1837)  . vancomycin 1,000 mg (06/21/19 1020)     LOS: 10 days    Time spent:35 mins. More than 50% of that time was spent in counseling and/or coordination of care.      Shelly Coss, MD [facility] Pager [phone]  If 7PM-7AM, please contact night-coverage [email or web address] Password [facility] 06/23/2019, 11:16 AM

## 2019-06-23 NOTE — Progress Notes (Addendum)
[facility] Progress Note   Subjective:   Patient seen and examined at bedside.  Feeling about the same today. Denies bloody BM overnight, SOB, edema and n/v.  CT yesterday showed aortic stent graft with no acute changes, increased edema, and large stool burden. GI plans for EGD and flexible sigmoidoscopy tomorrow.   Objective Vitals:   06/22/19 2023 06/23/19 0422 06/23/19 0858 06/23/19 0920  BP: (!) 124/56 (!) 112/48 133/62 129/60  Pulse: 89 93 90 88  Resp: 18 20 16 16   Temp: 97.7 F (36.5 C) 98.9 F (37.2 C) 98.3 F (36.8 C) 98.5 F (36.9 C)  TempSrc: Oral Oral Oral Oral  SpO2: 100% 99%  93%  Weight:      Height:       Physical Exam General:NAD, well appearing male, laying in bed Heart:RRR Lungs:CTAB, nml WOB Abdomen:soft, +tenderness, +BS Extremities:no LE edema Dialysis Access: R IJ [facility]   Filed Weights   06/20/19 2108 06/21/19 0633 06/21/19 1134  Weight: 93 kg 93.4 kg 91.4 kg    Intake/Output Summary (Last 24 hours) at 06/23/2019 1218 Last data filed at 06/23/2019 0900 Gross per 24 hour  Intake 315 ml  Output 0 ml  Net 315 ml    Additional Objective Labs: Basic Metabolic Panel: Recent Labs  Lab 06/19/19 0448 06/21/19 0812 06/23/19 0608  NA 130* 132* 133*  K 4.5 3.4* 4.5  CL 92* 94* 96*  CO2 22 25 24   GLUCOSE 102* 94 134*  BUN 72* 39* 52*  CREATININE 11.28* 6.99* 8.82*  CALCIUM 9.1 8.7* 8.9  PHOS 4.9* 3.1  --    Liver Function Tests: Recent Labs  Lab 06/21/19 0812  ALBUMIN 1.9*   CBC: Recent Labs  Lab 06/18/19 0557 06/19/19 0448 06/21/19 0813  06/22/19 1101 06/22/19 1700 06/22/19 2320 06/23/19 0608  WBC 13.3* 13.3* 7.6  --  23.1*  --   --  21.1*  NEUTROABS  --  8.9*  --   --  18.5*  --   --  17.2*  HGB 8.0* 8.0* 7.7*   < > 6.2* 7.4* 7.0* 6.8*  HCT 24.5* 24.7* 23.9*   < > 19.2* 23.2* 21.2* 20.9*  MCV 95.0 95.7 94.5  --  95.5  --   --  91.7  PLT 441* 454* 341  --  303  --   --  291   < > = values in this interval not  displayed.   Blood Culture    Component Value Date/Time   SDES WOUND 06/17/2019 1512   SPECREQUEST L4 L5 DISC ASPIRATION 06/17/2019 1512   CULT  06/17/2019 1512    No growth aerobically or anaerobically. Performed at [facility], [place] [address] [place], [place] [address]    REPTSTATUS 06/22/2019 FINAL 06/17/2019 1512    Lab Results  Component Value Date   INR 1.3 (H) 06/17/2019   INR 1.3 (H) 06/05/2019   INR 1.3 (H) 06/03/2019   Studies/Results: Ct Head Wo Contrast  Result Date: 06/21/2019 CLINICAL DATA:  58 year old male with new onset seizure. EXAM: CT HEAD WITHOUT CONTRAST TECHNIQUE: Contiguous axial images were obtained from the base of the skull through the vertex without intravenous contrast. COMPARISON:  01/10/2019 CT and prior studies FINDINGS: Brain: No evidence of acute infarction, hemorrhage, hydrocephalus, extra-axial collection or mass lesion/mass effect. Chronic small-vessel white matter ischemic changes and remote LEFT basal ganglia infarct again noted. Vascular: Carotid and vertebral atherosclerotic calcifications noted. Skull: Normal. Negative for fracture or focal lesion. Sinuses/Orbits:  No acute finding. Other: None. IMPRESSION: 1. No evidence of acute intracranial abnormality. 2. Chronic small-vessel white matter ischemic changes and remote LEFT basal ganglia infarct. Electronically Signed   By: Margarette Canada M.D.   On: 06/21/2019 21:37   Dg Abd Portable 1v  Result Date: 06/21/2019 CLINICAL DATA:  Abdominal pain. EXAM: PORTABLE ABDOMEN - 1 VIEW COMPARISON:  06/13/2019 FINDINGS: A large amount of colonic stool is again noted. No dilated small bowel loops are present. Aortic graft again noted. IMPRESSION: Large amount of colonic stool. No evidence of small bowel obstruction. Electronically Signed   By: Margarette Canada M.D.   On: 06/21/2019 20:35   Ct Angio Abd/pel W/ And/or W/o  Result Date: 06/22/2019 CLINICAL DATA:  58 year old male with drop in hemoglobin and  bilateral leg weakness. Aortic stent placed several weeks ago. EXAM: CTA ABDOMEN AND PELVIS WITHOUT AND WITH CONTRAST TECHNIQUE: Multidetector CT imaging of the abdomen and pelvis was performed using the standard protocol during bolus administration of intravenous contrast. Multiplanar reconstructed images and MIPs were obtained and reviewed to evaluate the vascular anatomy. CONTRAST:  153mL OMNIPAQUE IOHEXOL 350 MG/ML SOLN COMPARISON:  06/13/2019 CT from [facility] and other prior studies. FINDINGS: VASCULAR Aorta: A thoracic aortic stent graft is again noted without interval change or adjacent hematoma. A 3.5 cm infrarenal suprailiac abdominal aortic aneurysm is again noted without dissection or periaortic hematoma. Celiac: Patent with moderate atherosclerotic plaque at the origin. SMA: Patent with mild atherosclerotic plaque at the origin Renals: Patent with atherosclerotic plaque at the origins IMA: Patent with atherosclerotic plaque at the origin Review of the MIP images confirms the above findings. NON-VASCULAR Lower chest: No acute abnormality. Hepatobiliary: The liver and gallbladder are unremarkable. No biliary dilatation. Pancreas: Unremarkable Spleen: Unremarkable Adrenals/Urinary Tract: Mildly atrophic kidneys noted with too small to characterize renal lesions again noted. Stomach/Bowel: There is no evidence of bowel obstruction. No definite bowel wall thickening identified. The appendix is not identified. A large amount of stool within the rectum is noted with mild circumferential rectal wall thickening. Lymphatic: No enlarged lymph nodes identified. Reproductive: No definite abnormality Other: A small amount of ascites noted as well as edema within the subcutaneous, abdominal and pelvic fat. Moderate ill-defined fluid in the presacral/precoccygeal region is noted, significantly increased from 06/13/2019. Musculoskeletal: No acute or suspicious bony abnormality. Moderate degenerative disc  disease, spondylosis and broad-based disc bulges contribute to moderate to severe central spinal and biforaminal narrowing at L4-5 and mild to moderate central spinal and biforaminal narrowing at L5-S1. IMPRESSION: VASCULAR Unchanged appearance of the visualized aorta with thoracic aortic stent graft and 3.5 cm abdominal aortic aneurysm. No evidence of aortic dissection or periaortic hematoma/hemorrhage. NON-VASCULAR Increased edema within the subcutaneous, abdominal and pelvic fat. New moderate ill-defined fluid in the presacral/pre coccygeal space most likely represents edema. Small amount of ascites. Degenerative changes within the LOWER lumbar spine contributing to moderate to severe central spinal and biforaminal narrowing at L4-5 and mild to moderate central spinal and biforaminal narrowing at L5-S1. Large amount of rectal stool. No other acute abnormality identified. Electronically Signed   By: Margarette Canada M.D.   On: 06/22/2019 16:33    Medications: . ceFEPime (MAXIPIME) IV Stopped (06/21/19 1837)  . vancomycin 1,000 mg (06/21/19 1020)   . atorvastatin  20 mg Oral q1800  . bisacodyl  10 mg Rectal Once  . darbepoetin (ARANESP) injection - DIALYSIS  100 mcg Intravenous Q Sat-HD  . [START ON 06/24/2019] ferric citrate  630 mg Oral  TID WC  . pantoprazole  40 mg Oral Q0600  . polyethylene glycol  17 g Oral Q4H  . [START ON 06/24/2019] polyethylene glycol  17 g Oral Daily  . sodium chloride flush  3 mL Intravenous Q12H    Dialysis Orders: [facility]  4h 23min 400/1.5 87.5kg 2/2.25 RIJ TDC Hep 3000  - Parsabiv 2.5mg  IV q HD  - Calcitriol 1.8mcg PO q HD   Assessment/Plan: 1.Fever/leukocytosis: s/p recent aortic stent and TDC. Possible PNA &proctitis on initial CT completed at [facility]. BC neg from [place], repeat 8/23 w/NGTD. On Vanc/Cefepime. Spike in WBC yesterday to 23.1. Afebrile 2. L4-5 lumbar diskitis: suspected cause of #1. On Vanc/Cefepime x6wks per ID. IR aspiration pending.  3.  Spinal cord infarct: per spinal MRI 8/23, after complaints of not being able to move legs. Neurosurgery consulted, no plan for intervention, recommend daily ASA. CIR to evaluate for possible placement.  4. Hx Type B aortic dissection (s/p stent graft repair 8/14) - goal SBP per VVS 120-170. Known severe resistant HTN. Trying to avoid BP drops on HD. On ASA. 5. ESRD -on HD TTS. K 4.5. Plan for HD off schedule today to better facilitate GI procedures tomorrow.  Will resume regular schedule later this week.  Minimal UF d/t GIB.  6. Anemia of CKD-Hgb down to 6.8, s/p 2 unit pRBC overnight.  Continue aranesp 142mcg qwk 7. Secondary hyperparathyroidism -Ca/phos in goal. Continue calcitriol. Parsabiv not on hospital formulary.  Holding [place] today to avoid confusion of dark material at Endo tomorrow per GI recommendations. Restart post procedure when eating.  8. HTN/volume -BP in goal this AM, HTN meds stopped.Does not appear volume overloaded on exam.  Plan for minimal UF d/t suspected GIB.   9. Nutrition -Renal diet w/fluid restrictions. Alb 1.9. protein supplements, vit 10. Recent MSSA bacteremia/AVG infection - Hx infected AVG s/p revision in 5/20, then partial resection in 6/20, then all AVG material (x2) removed 6/30. S/p 6 weeks Ancef completed. 11. Hematochezia suspected GIB - GI consulted. Large stool burden per CT.  Plan for EGD and flex sigmoid tomorrow.   Jen Mow, PA-C [facility] Pager: [phone] 06/23/2019,12:18 PM  LOS: 10 days   Pt seen, examined and agree w A/P as above.  [name]  MD 06/23/2019, 2:34 PM

## 2019-06-23 NOTE — Progress Notes (Signed)
Inpatient Rehabilitation Admissions Coordinator  I continue to follow pt progress medically as well as abilities to tolerate more with therapy.  Danne Baxter, RN, MSN Rehab Admissions Coordinator [phone] 06/23/2019 4:43 PM

## 2019-06-23 NOTE — Progress Notes (Signed)
OT Cancellation Note  Patient Details Name: Frank Fuller MRN: [id number] DOB: [birth year]   Cancelled Treatment:    Reason Eval/Treat Not Completed: Patient at procedure or test/ unavailable. Pt went ot HD this afternoon, OT will try again tomorrow as appropriate  Britt Bottom 06/23/2019, 2:39 PM

## 2019-06-23 NOTE — Progress Notes (Signed)
PT Cancellation Note  Patient Details Name: Frank Fuller MRN: [id number] DOB: [birth year]   Cancelled Treatment:    Reason Eval/Treat Not Completed: Patient at procedure or test/unavailable   Currently in HD;  Will follow up later today as time allows;  Otherwise, will follow up for PT tomorrow;   Thank you,  [name], PT  Acute Rehabilitation Services Pager [phone] Office [place] 06/23/2019, 2:22 PM

## 2019-06-23 NOTE — Progress Notes (Addendum)
Daily Rounding Note  06/23/2019, 10:20 AM  LOS: 10 days   SUBJECTIVE:   Chief complaint: Bloody stool, melena, acute on chronic anemia. Bil low abdominal pain continues.  It is 4-5/10.   No nausea. Brown stool this morning after which the pain in his abdomen improved.  OBJECTIVE:         Vital signs in last 24 hours:    Temp:  [97.5 F (36.4 C)-98.9 F (37.2 C)] 98.5 F (36.9 C) (08/31 0920) Pulse Rate:  [77-93] 88 (08/31 0920) Resp:  [16-20] 16 (08/31 0920) BP: (87-133)/(45-62) 129/60 (08/31 0920) SpO2:  [93 %-100 %] 93 % (08/31 0920) Last BM Date: 06/22/19 Filed Weights   06/20/19 2108 06/21/19 0633 06/21/19 1134  Weight: 93 kg 93.4 kg 91.4 kg   General: Does not look ill.  Pleasant, alert. Heart: RRR. Chest: Clear bilaterally.  No labored breathing or cough. Abdomen: Soft.  Tenderness bilaterally from the level of the waist down into the pelvis.  No guarding or rebound.  Bowel sounds active.  No HSM, masses, bruits, hernias. Extremities: Slight, nonpitting edema in the feet. Neuro/Psych: Cooperative, alert.  Oriented x3.  Bilateral lower extremity weakness.  Intake/Output from previous day: 08/30 0701 - 08/31 0700 In: 495 [P.O.:180; I.V.:315] Out: 0   Intake/Output this shift: No intake/output data recorded.  Lab Results: Recent Labs    06/21/19 0813  06/22/19 1101 06/22/19 1700 06/22/19 2320 06/23/19 0608  WBC 7.6  --  23.1*  --   --  21.1*  HGB 7.7*   < > 6.2* 7.4* 7.0* 6.8*  HCT 23.9*   < > 19.2* 23.2* 21.2* 20.9*  PLT 341  --  303  --   --  291   < > = values in this interval not displayed.   BMET Recent Labs    06/21/19 0812 06/23/19 0608  NA 132* 133*  K 3.4* 4.5  CL 94* 96*  CO2 25 24  GLUCOSE 94 134*  BUN 39* 52*  CREATININE 6.99* 8.82*  CALCIUM 8.7* 8.9   LFT Recent Labs    06/21/19 0812  ALBUMIN 1.9*   PT/INR No results for input(s): LABPROT, INR in the last 72 hours.  Hepatitis Panel No results for input(s): HEPBSAG, HCVAB, HEPAIGM, HEPBIGM in the last 72 hours.  Studies/Results: Ct Head Wo Contrast  Result Date: 06/21/2019 CLINICAL DATA:  58 year old male with new onset seizure. EXAM: CT HEAD WITHOUT CONTRAST TECHNIQUE: Contiguous axial images were obtained from the base of the skull through the vertex without intravenous contrast. COMPARISON:  01/10/2019 CT and prior studies FINDINGS: Brain: No evidence of acute infarction, hemorrhage, hydrocephalus, extra-axial collection or mass lesion/mass effect. Chronic small-vessel white matter ischemic changes and remote LEFT basal ganglia infarct again noted. Vascular: Carotid and vertebral atherosclerotic calcifications noted. Skull: Normal. Negative for fracture or focal lesion. Sinuses/Orbits: No acute finding. Other: None. IMPRESSION: 1. No evidence of acute intracranial abnormality. 2. Chronic small-vessel white matter ischemic changes and remote LEFT basal ganglia infarct. Electronically Signed   By: Margarette Canada M.D.   On: 06/21/2019 21:37   Dg Abd Portable 1v  Result Date: 06/21/2019 CLINICAL DATA:  Abdominal pain. EXAM: PORTABLE ABDOMEN - 1 VIEW COMPARISON:  06/13/2019 FINDINGS: A large amount of colonic stool is again noted. No dilated small bowel loops are present. Aortic graft again noted. IMPRESSION: Large amount of colonic stool. No evidence of small bowel obstruction. Electronically Signed   By: Dellis Filbert  Hu M.D.   On: 06/21/2019 20:35   Ct Angio Abd/pel W/ And/or W/o  Result Date: 06/22/2019 CLINICAL DATA:  58 year old male with drop in hemoglobin and bilateral leg weakness. Aortic stent placed several weeks ago. EXAM: CTA ABDOMEN AND PELVIS WITHOUT AND WITH CONTRAST TECHNIQUE: Multidetector CT imaging of the abdomen and pelvis was performed using the standard protocol during bolus administration of intravenous contrast. Multiplanar reconstructed images and MIPs were obtained and reviewed to evaluate the  vascular anatomy. CONTRAST:  164mL OMNIPAQUE IOHEXOL 350 MG/ML SOLN COMPARISON:  06/13/2019 CT from [facility] and other prior studies. FINDINGS: VASCULAR Aorta: A thoracic aortic stent graft is again noted without interval change or adjacent hematoma. A 3.5 cm infrarenal suprailiac abdominal aortic aneurysm is again noted without dissection or periaortic hematoma. Celiac: Patent with moderate atherosclerotic plaque at the origin. SMA: Patent with mild atherosclerotic plaque at the origin Renals: Patent with atherosclerotic plaque at the origins IMA: Patent with atherosclerotic plaque at the origin Review of the MIP images confirms the above findings. NON-VASCULAR Lower chest: No acute abnormality. Hepatobiliary: The liver and gallbladder are unremarkable. No biliary dilatation. Pancreas: Unremarkable Spleen: Unremarkable Adrenals/Urinary Tract: Mildly atrophic kidneys noted with too small to characterize renal lesions again noted. Stomach/Bowel: There is no evidence of bowel obstruction. No definite bowel wall thickening identified. The appendix is not identified. A large amount of stool within the rectum is noted with mild circumferential rectal wall thickening. Lymphatic: No enlarged lymph nodes identified. Reproductive: No definite abnormality Other: A small amount of ascites noted as well as edema within the subcutaneous, abdominal and pelvic fat. Moderate ill-defined fluid in the presacral/precoccygeal region is noted, significantly increased from 06/13/2019. Musculoskeletal: No acute or suspicious bony abnormality. Moderate degenerative disc disease, spondylosis and broad-based disc bulges contribute to moderate to severe central spinal and biforaminal narrowing at L4-5 and mild to moderate central spinal and biforaminal narrowing at L5-S1. IMPRESSION: VASCULAR Unchanged appearance of the visualized aorta with thoracic aortic stent graft and 3.5 cm abdominal aortic aneurysm. No evidence of aortic  dissection or periaortic hematoma/hemorrhage. NON-VASCULAR Increased edema within the subcutaneous, abdominal and pelvic fat. New moderate ill-defined fluid in the presacral/pre coccygeal space most likely represents edema. Small amount of ascites. Degenerative changes within the LOWER lumbar spine contributing to moderate to severe central spinal and biforaminal narrowing at L4-5 and mild to moderate central spinal and biforaminal narrowing at L5-S1. Large amount of rectal stool. No other acute abnormality identified. Electronically Signed   By: Margarette Canada M.D.   On: 06/22/2019 16:33    ASSESMENT:   *     Bloody stool, melena. CTAP w angio 8/31: No change in thoracic aortic stent graft and 3.5 cm AAA.  Increased edema in the subcu, abdominal, pelvic fat.  New, moderate, ill-defined fluid in presacral, pre-coccygeal space likely representing edema.  Small amount of ascites.  Large volume stool in rectum.  No evidence of colitis or mesenteric vascular disease  *     Anemia.  Acute on chronic.  History anemia of chronic disease. Saved PRBCs x 2.  Hgb 6.2 >> 1 PRBC >> 7 >> 6.8.      *      ESRD, on HD.  *     Aortic dissection.  06/03/2019 endovascular stent graft complicated by thoracic spine ischemia associated paraplegia.  *      History CVA.  *    Elevated WBCs.  Suspect due to lumbar discitis.  Possible pneumonia and proctitis on  [facility] CT.  Blood cultures negative.  On Vanco/cefepime for 6 weeks per ID.   PLAN   *     EGD and flexible sigmoidoscopy tomorrow. Give Dulcolax per rectum and oral laxatives today.  Tap water enema pre-procedure tomorrow Okay to eat renal diet today.  *    Hold ferric gluconate until tomorrow in order to avoid confusion of dark material in stomach at Endo tomorrow.  *     Switched from IV to oral Protonix.  *    H&H early this evening and CBC in the morning.    Azucena Freed  06/23/2019, 10:20 AM Phone [phone]    Attending  physician's note   I have taken an interval history, reviewed the chart and examined the patient. I agree with the Advanced Practitioner's note, impression and recommendations.   GI Bleed (upper vs lower). Neg CTA except for rectal large stool impaction (reviewed). No ischemia or active bleeding. CT at [facility] did show rectal inflammation. H/O neg colon at [facility] [place] few yrs ago.  Acute on chronic anemia. Recent aortic dissection s/p aortic graft [id number] complicated by thoracic spine ischemia with paraplegia. ESRD on HD Lumbar discitis on antibiotics  Plan: -EGD and flexible sigmoidoscopy (could have stercoral ulcer) tomorrow. -Enemas today -MiraLAX p.o. -Continue Protonix for now. -Trend CBC.  Transfuse if Hb<8 -If etiology is still not evident, may need full colonoscopy at a later date.  Carmell Austria, MD [facility] [phone].

## 2019-06-24 ENCOUNTER — Inpatient Hospital Stay (HOSPITAL_COMMUNITY): Payer: Medicare Other | Admitting: Anesthesiology

## 2019-06-24 ENCOUNTER — Encounter (HOSPITAL_COMMUNITY)
Admission: AD | Disposition: A | Discharge status: Rehabilitation Facility | Payer: Self-pay | Attending: Internal Medicine

## 2019-06-24 ENCOUNTER — Encounter (HOSPITAL_COMMUNITY): Payer: Self-pay | Admitting: *Deleted

## 2019-06-24 DIAGNOSIS — K626 Ulcer of anus and rectum: Secondary | ICD-10-CM

## 2019-06-24 DIAGNOSIS — K259 Gastric ulcer, unspecified as acute or chronic, without hemorrhage or perforation: Secondary | ICD-10-CM

## 2019-06-24 HISTORY — PX: ESOPHAGOGASTRODUODENOSCOPY (EGD) WITH PROPOFOL: SHX5813

## 2019-06-24 HISTORY — PX: BIOPSY: SHX5522

## 2019-06-24 HISTORY — PX: FLEXIBLE SIGMOIDOSCOPY: SHX5431

## 2019-06-24 LAB — BPAM RBC
Blood Product Expiration Date: 202010052359
Blood Product Expiration Date: 202010052359
Blood Product Expiration Date: 202010062359
ISSUE DATE / TIME: 202008301320
ISSUE DATE / TIME: 202008310854
ISSUE DATE / TIME: 202008311531
Unit Type and Rh: 5100
Unit Type and Rh: 5100
Unit Type and Rh: 5100

## 2019-06-24 LAB — TYPE AND SCREEN
ABO/RH(D): O POS
Antibody Screen: NEGATIVE
Unit division: 0
Unit division: 0
Unit division: 0

## 2019-06-24 LAB — CBC
HCT: 24.8 % — ABNORMAL LOW (ref 39.0–52.0)
Hemoglobin: 7.9 g/dL — ABNORMAL LOW (ref 13.0–17.0)
MCH: 29 pg (ref 26.0–34.0)
MCHC: 31.9 g/dL (ref 30.0–36.0)
MCV: 91.2 fL (ref 80.0–100.0)
Platelets: 286 10*3/uL (ref 150–400)
RBC: 2.72 MIL/uL — ABNORMAL LOW (ref 4.22–5.81)
RDW: 17.3 % — ABNORMAL HIGH (ref 11.5–15.5)
WBC: 11.8 10*3/uL — ABNORMAL HIGH (ref 4.0–10.5)
nRBC: 0 % (ref 0.0–0.2)

## 2019-06-24 LAB — BASIC METABOLIC PANEL
Anion gap: 14 (ref 5–15)
BUN: 27 mg/dL — ABNORMAL HIGH (ref 6–20)
CO2: 22 mmol/L (ref 22–32)
Calcium: 8.4 mg/dL — ABNORMAL LOW (ref 8.9–10.3)
Chloride: 97 mmol/L — ABNORMAL LOW (ref 98–111)
Creatinine, Ser: 5.63 mg/dL — ABNORMAL HIGH (ref 0.61–1.24)
GFR calc Af Amer: 12 mL/min — ABNORMAL LOW (ref >60)
GFR calc non Af Amer: 10 mL/min — ABNORMAL LOW (ref >60)
Glucose, Bld: 96 mg/dL (ref 70–99)
Potassium: 4.7 mmol/L (ref 3.5–5.1)
Sodium: 133 mmol/L — ABNORMAL LOW (ref 135–145)

## 2019-06-24 SURGERY — ESOPHAGOGASTRODUODENOSCOPY (EGD) WITH PROPOFOL
Anesthesia: Monitor Anesthesia Care

## 2019-06-24 MED ORDER — PROPOFOL 10 MG/ML IV BOLUS
INTRAVENOUS | Status: DC | PRN
  Administered 2019-06-24 (×3): 20 mg via INTRAVENOUS

## 2019-06-24 MED ORDER — FERRIC CITRATE 1 GM 210 MG(FE) PO TABS
210.0000 mg | ORAL_TABLET | Freq: Three times a day (TID) | ORAL | Status: DC
  Administered 2019-06-25: 210 mg via ORAL
  Filled 2019-06-24 (×2): qty 1

## 2019-06-24 MED ORDER — DARBEPOETIN ALFA 150 MCG/0.3ML IJ SOSY: 150.0000 ug | PREFILLED_SYRINGE | INTRAMUSCULAR | Status: DC

## 2019-06-24 MED ORDER — HEPARIN SODIUM (PORCINE) 1000 UNIT/ML IJ SOLN
3.4000 mL | Freq: Once | INTRAMUSCULAR | Status: AC
  Administered 2019-06-24: 3400 [IU] via INTRAVENOUS

## 2019-06-24 MED ORDER — PROMETHAZINE HCL 25 MG/ML IJ SOLN: 6.2500 mg | INTRAMUSCULAR | Status: DC | PRN

## 2019-06-24 MED ORDER — ASPIRIN EC 81 MG PO TBEC
81.0000 mg | DELAYED_RELEASE_TABLET | Freq: Every day | ORAL | Status: DC
  Administered 2019-06-25 – 2019-07-02 (×7): 81 mg via ORAL
  Filled 2019-06-24 (×9): qty 1

## 2019-06-24 MED ORDER — OXYCODONE HCL 5 MG PO TABS
ORAL_TABLET | ORAL | Status: AC
  Filled 2019-06-24: qty 1

## 2019-06-24 MED ORDER — NONFORMULARY OR COMPOUNDED ITEM
2.0000 g | Freq: Two times a day (BID) | Status: DC
  Administered 2019-06-25 – 2019-07-01 (×10): 2 g via RECTAL
  Filled 2019-06-24 (×25): qty 1

## 2019-06-24 MED ORDER — PROPOFOL 500 MG/50ML IV EMUL
INTRAVENOUS | Status: DC | PRN
  Administered 2019-06-24: 100 ug/kg/min via INTRAVENOUS

## 2019-06-24 MED ORDER — HEPARIN SODIUM (PORCINE) 1000 UNIT/ML IJ SOLN
INTRAMUSCULAR | Status: AC
  Filled 2019-06-24: qty 4

## 2019-06-24 MED ORDER — PSYLLIUM 95 % PO PACK
1.0000 | PACK | Freq: Every day | ORAL | Status: DC
  Administered 2019-06-25: 1 via ORAL
  Filled 2019-06-24: qty 1

## 2019-06-24 MED ORDER — SODIUM CHLORIDE 0.9 % IV SOLN
INTRAVENOUS | Status: DC
  Administered 2019-06-24: 09:00:00 via INTRAVENOUS

## 2019-06-24 SURGICAL SUPPLY — 14 items

## 2019-06-24 NOTE — Progress Notes (Addendum)
Unable to order Carafate enemas 2g/69mL PR BID x 6 weeks in Epic. I called the inpatient pharmacy requesting assistance and hope to have additional information this afternoon.   Addendum: Nicole Cella accepted a verbal order and has asked IT to build an order for future use.

## 2019-06-24 NOTE — Progress Notes (Signed)
Inpatient Rehabilitation Admissions Coordinator  Patient has not yet demonstrated the ability to tolerate the intensity of an inpt rehab admit. Dr. Tawanna Solo contacted me to state pt medically ready to d/c . I am unable to offer a bed yet due to pt's tolerance . I will notify SW, Lorriane Shire.  Danne Baxter, RN, MSN Rehab Admissions Coordinator [phone] 06/24/2019 1:23 PM

## 2019-06-24 NOTE — Progress Notes (Addendum)
PROGRESS NOTE    Frank Fuller  [id number] DOB: December 05, 1960 DOA: 06/13/2019 PCP: [facility]   Brief Narrative:  Patient is a 58 year old male with history of hypertension, ESRD on dialysis( Tuesday,Thursday,Saturday), abdominal aortic aneurysm status post stent, hepatitis C virus status post treatment, depression, CVA who initially presented to [facility] with multiple complaints.  He was recently hospitalized from 8/11-8/20 for dissection of thoracic aorta(type B) and was treated with endovascular stent graft with spinal drain by Dr. Oneida Alar on 8/14.  He was discharged home but immediately came back with complaints of urge to use the restroom but unable to do so.  He also had fever.  He was found to have paraplegia.  He was suspected to have thoracic spinal ischemia.  CT imaging also showed possible osteomyelitis/discitis of L4-L5.  Also suspected to have sepsis secondary to possible pneumonia. Underwent disc aspiration by IR on 06/18/2019.  Neurosurgery, vascular surgery, ID were following.   Hospital course remarkable for multiple  episodes of bloody bowel movement with clots .FoBT positive with acute blood loss anemia. Underwent  EGD and flexible sigmoidoscopy on 06/24/19.Waiting for transfer to CIR/SNF. Patient is  medically stable for discharge to CIR/SNF as soon as the bed is available.   Assessment & Plan:   Principal Problem:   Lumbar discitis Active Problems:   ESRD on hemodialysis (HCC)   Ileus (HCC)   Proctitis   H/O endovascular stent graft for abdominal aortic aneurysm   Gastric ulcer   Rectal ulceration   Suspected sepsis secondary to pneumonia discitis/osteomyelitis: Presented with fever of 102F, leukocytosis.  CRP/procalcitonin was elevated.  CT also showed possible right-sided pneumonia with Iileus and proctitis.  Cultures from [facility] did not show any growth.  Cultures sent here has not shown any growth yet.  Started on broad-spectrum antibiotics  with vancomycin and cefepime.  Currently is afebrile.   MRI lumbar spine showed new space-occupying material in the lower retroperitoneum and lumbosacral junction.  Favored acute discitis and osteomyelitis at L4-L5 with pervertebral phlegmon.  Neurosurgery , interventional radiology consulted .S/P disc aspiration.Culture negative so far. Infectious disease was following and recommended 6 weeks of antibiotics with dialysis. Last day of antibiotics will be 07/29/19.   GI bleed/abdominal pain: Patient had several episodes of bloody bowel movement with clots and also complained of abdominal pain few days ago .  FOBT positive.CT angiogram did not show any active bleeding or any other acute abnormality.  Underwent  EGD and flexible sigmoidoscopy today with finding of nonbleeding erosive gastropathy and multiple ulcers in the rectum due to fecal impaction but no bleeding.  GI recommending daily MiraLAX, Metamucil and Carafate enema for 6 weeks.   Acute on chronic normocytic anemia: Secondary to GI blood loss and ESRD.  Hemoglobin this morning is 7.9.  He received 2 units of PRBC so far.  Hypoxia:On presentation he was  noted to be hypoxic with desaturation on room air.Pneumonia suspected.   CT had shown left pleural effusion, proBNP was elevated.  Continue volume control with dialysis.Last chest x-ray shows left base atelectasis.  Currently on room air  Chest pain/elevated troponin: Elevated troponin but they are flat.  Most likely secondary to supply demand ischemia due to ESRD. Denied any chest pain today.  EKG did not show any significant ST changes  ESRD on dialysis:ESRD on dialysis( Tuesday,Thursday,Saturday).  Nephrology following.    Thoracic aortic dissection: Status post endovascular stent placement.  Vascular surgery was following  Hypertension: Antihypertensives was held due to  hypotension.  Please resume when appropriate.  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  Abnormal spinal cord at T9-T10, T10-11, possible segmental cord infarct.  Neurosurgery,neurology was consulted and they  signed off.  No plan for intervention. Recommended daily baby aspirin by neurology. His strength on bilateral lower extremities have improved.PT recommending CIR.Rehab coordinator/Social worker aware.        DVT prophylaxis: SCD Code Status: Full Family Communication: None present at the bedside.  Discussed plan with the patient.  Patient has been talking to his family. Disposition Plan: CIR/SNF whenever possible  Consultants: Nephrology, ID, vascular surgery  Procedures: Dialysis, disc aspiration Antimicrobials:  Anti-infectives (From admission, onward)   Start     Dose/Rate Route Frequency Ordered Stop   06/21/19 1200  vancomycin (VANCOCIN) IVPB 1000 mg/200 mL premix     1,000 mg 200 mL/hr over 60 Minutes Intravenous Every T-Th-Sa (Hemodialysis) 06/20/19 0800     06/18/19 0808  vancomycin variable dose per unstable renal function (pharmacist dosing)  Status:  Discontinued      Does not apply See admin instructions 06/18/19 0808 06/20/19 0805   06/14/19 1800  ceFEPIme (MAXIPIME) 2 g in sodium chloride 0.9 % 100 mL IVPB     2 g 200 mL/hr over 30 Minutes Intravenous Every T-Th-Sa (1800) 06/13/19 1540     06/14/19 1200  vancomycin (VANCOCIN) IVPB 1000 mg/200 mL premix  Status:  Discontinued     1,000 mg 200 mL/hr over 60 Minutes Intravenous Every T-Th-Sa (Hemodialysis) 06/13/19 1540 06/18/19 0808   06/13/19 1545  vancomycin (VANCOCIN) 2,000 mg in sodium chloride 0.9 % 500 mL IVPB     2,000 mg 250 mL/hr over 120 Minutes Intravenous  Once 06/13/19 1519 06/13/19 1829   06/13/19 1545  ceFEPIme (MAXIPIME) 2 g in sodium chloride 0.9 % 100 mL IVPB     2 g 200 mL/hr over 30 Minutes Intravenous  Once 06/13/19 1519 06/13/19 2241       Subjective:  Patient seen and examined the bedside this morning.  Today he is hemodynamically stable, looked comfortable.  Complains of some mild lower abdominal pain today.  Currently on room air.  Denies any shortness of breath or cough.  Waiting for EGD, sigmoidoscopy.  Objective: Vitals:   06/24/19 1007 06/24/19 1015 06/24/19 1025 06/24/19 1045  BP: (!) 131/58 (!) 151/72 (!) 169/85 (!) 159/76  Pulse: 91 89 93 88  Resp: 16 14 18 18   Temp: 97.8 F (36.6 C)     TempSrc: Oral     SpO2: 94% 95% 90% (!) 87%  Weight:      Height:        Intake/Output Summary (Last 24 hours) at 06/24/2019 1150 Last data filed at 06/24/2019 0754 Gross per 24 hour  Intake 1807 ml  Output 576 ml  Net 1231 ml   Filed Weights   06/23/19 1350 06/23/19 1800 06/23/19 2022  Weight: 92.7 kg 92.2 kg 91.5 kg    Examination:  General exam: Not in distress,average built HEENT:PERRL,Oral mucosa moist, Ear/Nose normal on gross exam Respiratory system: Normal vesicular breath sounds, no wheezes or crackles .  Mildly decreased air entry in the left side. Cardiovascular system: S1 & S2 heard, RRR. No JVD, murmurs, rubs, gallops or clicks.  Dialysis catheter on the chest Gastrointestinal system: Abdomen is nondistended, soft.Mild tenderness on the lower abdomen. No organomegaly or masses felt. Normal bowel sounds heard. Central  nervous system: Alert and oriented. No focal neurological deficits. Extremities: No edema, no clubbing ,no cyanosis, distal peripheral pulses palpable. Skin: No rashes, lesions or ulcers,no icterus ,no pallor   Data Reviewed: I have personally reviewed following labs and imaging studies  CBC: Recent Labs  Lab 06/19/19 0448 06/21/19 0813  06/22/19 1101 06/22/19 1700 06/22/19 2320 06/23/19 0608 06/23/19 1847 06/24/19 0638  WBC 13.3* 7.6  --  23.1*  --   --  21.1*  --  11.8*  NEUTROABS 8.9*  --   --  18.5*  --   --  17.2*  --   --   HGB 8.0* 7.7*   < > 6.2* 7.4* 7.0* 6.8* 8.2*  7.9*  HCT 24.7* 23.9*   < > 19.2* 23.2* 21.2* 20.9* 25.0* 24.8*  MCV 95.7 94.5  --  95.5  --   --  91.7  --  91.2  PLT 454* 341  --  303  --   --  291  --  286   < > = values in this interval not displayed.   Basic Metabolic Panel: Recent Labs  Lab 06/18/19 0557 06/19/19 0448 06/21/19 0812 06/23/19 0608 06/24/19 0638  NA 133* 130* 132* 133* 133*  K 4.6 4.5 3.4* 4.5 4.7  CL 95* 92* 94* 96* 97*  CO2 25 22 25 24 22   GLUCOSE 110* 102* 94 134* 96  BUN 55* 72* 39* 52* 27*  CREATININE 9.65* 11.28* 6.99* 8.82* 5.63*  CALCIUM 8.8* 9.1 8.7* 8.9 8.4*  PHOS  --  4.9* 3.1  --   --    GFR: Estimated Creatinine Clearance: 17.1 mL/min (A) (by C-G formula based on SCr of 5.63 mg/dL (H)). Liver Function Tests: Recent Labs  Lab 06/21/19 0812  ALBUMIN 1.9*   No results for input(s): LIPASE, AMYLASE in the last 168 hours. No results for input(s): AMMONIA in the last 168 hours. Coagulation Profile: No results for input(s): INR, PROTIME in the last 168 hours. Cardiac Enzymes: No results for input(s): CKTOTAL, CKMB, CKMBINDEX, TROPONINI in the last 168 hours. BNP (last 3 results) No results for input(s): PROBNP in the last 8760 hours. HbA1C: No results for input(s): HGBA1C in the last 72 hours. CBG: No results for input(s): GLUCAP in the last 168 hours. Lipid Profile: No results for input(s): CHOL, HDL, LDLCALC, TRIG, CHOLHDL, LDLDIRECT in the last 72 hours. Thyroid Function Tests: No results for input(s): TSH, T4TOTAL, FREET4, T3FREE, THYROIDAB in the last 72 hours. Anemia Panel: No results for input(s): VITAMINB12, FOLATE, FERRITIN, TIBC, IRON, RETICCTPCT in the last 72 hours. Sepsis Labs: No results for input(s): PROCALCITON, LATICACIDVEN in the last 168 hours.  Recent Results (from the past 240 hour(s))  Culture, blood (routine x 2)     Status: None   Collection Time: 06/15/19 11:20 AM   Specimen: BLOOD LEFT HAND  Result Value Ref Range Status   Specimen Description BLOOD LEFT  HAND  Final   Special Requests   Final    BOTTLES DRAWN AEROBIC ONLY Blood Culture results may not be optimal due to an inadequate volume of blood received in culture bottles   Culture   Final    NO GROWTH 5 DAYS Performed at [facility], [place] [address], [place], [place] [address]    Report Status 06/20/2019 FINAL  Final  Culture, blood (routine x 2)     Status: None   Collection Time: 06/15/19 11:28 AM   Specimen: BLOOD  Result Value Ref Range Status  Specimen Description BLOOD LEFT ANTECUBITAL  Final   Special Requests   Final    BOTTLES DRAWN AEROBIC ONLY Blood Culture results may not be optimal due to an inadequate volume of blood received in culture bottles   Culture   Final    NO GROWTH 5 DAYS Performed at [facility], [place] [address] [place], [place] [address]    Report Status 06/20/2019 FINAL  Final  Aerobic/Anaerobic Culture (surgical/deep wound)     Status: None   Collection Time: 06/17/19  3:12 PM   Specimen: Intervertebral Disc; Tissue  Result Value Ref Range Status   Specimen Description WOUND  Final   Special Requests L4 L5 DISC ASPIRATION  Final   Gram Stain NO WBC SEEN NO ORGANISMS SEEN   Final   Culture   Final    No growth aerobically or anaerobically. Performed at [facility], [place] [address] [place], [place] [address]    Report Status 06/22/2019 FINAL  Final         Radiology Studies: Ct Angio Abd/pel W/ And/or W/o  Result Date: 06/22/2019 CLINICAL DATA:  58 year old male with drop in hemoglobin and bilateral leg weakness. Aortic stent placed several weeks ago. EXAM: CTA ABDOMEN AND PELVIS WITHOUT AND WITH CONTRAST TECHNIQUE: Multidetector CT imaging of the abdomen and pelvis was performed using the standard protocol during bolus administration of intravenous contrast. Multiplanar reconstructed images and MIPs were obtained and reviewed to evaluate the vascular anatomy. CONTRAST:  19mL OMNIPAQUE IOHEXOL 350 MG/ML SOLN COMPARISON:   06/13/2019 CT from [facility] and other prior studies. FINDINGS: VASCULAR Aorta: A thoracic aortic stent graft is again noted without interval change or adjacent hematoma. A 3.5 cm infrarenal suprailiac abdominal aortic aneurysm is again noted without dissection or periaortic hematoma. Celiac: Patent with moderate atherosclerotic plaque at the origin. SMA: Patent with mild atherosclerotic plaque at the origin Renals: Patent with atherosclerotic plaque at the origins IMA: Patent with atherosclerotic plaque at the origin Review of the MIP images confirms the above findings. NON-VASCULAR Lower chest: No acute abnormality. Hepatobiliary: The liver and gallbladder are unremarkable. No biliary dilatation. Pancreas: Unremarkable Spleen: Unremarkable Adrenals/Urinary Tract: Mildly atrophic kidneys noted with too small to characterize renal lesions again noted. Stomach/Bowel: There is no evidence of bowel obstruction. No definite bowel wall thickening identified. The appendix is not identified. A large amount of stool within the rectum is noted with mild circumferential rectal wall thickening. Lymphatic: No enlarged lymph nodes identified. Reproductive: No definite abnormality Other: A small amount of ascites noted as well as edema within the subcutaneous, abdominal and pelvic fat. Moderate ill-defined fluid in the presacral/precoccygeal region is noted, significantly increased from 06/13/2019. Musculoskeletal: No acute or suspicious bony abnormality. Moderate degenerative disc disease, spondylosis and broad-based disc bulges contribute to moderate to severe central spinal and biforaminal narrowing at L4-5 and mild to moderate central spinal and biforaminal narrowing at L5-S1. IMPRESSION: VASCULAR Unchanged appearance of the visualized aorta with thoracic aortic stent graft and 3.5 cm abdominal aortic aneurysm. No evidence of aortic dissection or periaortic hematoma/hemorrhage. NON-VASCULAR Increased edema within the  subcutaneous, abdominal and pelvic fat. New moderate ill-defined fluid in the presacral/pre coccygeal space most likely represents edema. Small amount of ascites. Degenerative changes within the LOWER lumbar spine contributing to moderate to severe central spinal and biforaminal narrowing at L4-5 and mild to moderate central spinal and biforaminal narrowing at L5-S1. Large amount of rectal stool. No other acute abnormality identified. Electronically Signed   By:  Margarette Canada M.D.   On: 06/22/2019 16:33        Scheduled Meds:  atorvastatin  20 mg Oral q1800   darbepoetin (ARANESP) injection - DIALYSIS  100 mcg Intravenous Q Sat-HD   ferric citrate  630 mg Oral TID WC   pantoprazole  40 mg Oral Q0600   polyethylene glycol  17 g Oral Daily   psyllium  1 packet Oral Daily   sodium chloride flush  3 mL Intravenous Q12H   Continuous Infusions:  ceFEPime (MAXIPIME) IV Stopped (06/21/19 1837)   vancomycin 1,000 mg (06/24/19 1116)     LOS: 11 days    Time spent:35 mins. More than 50% of that time was spent in counseling and/or coordination of care.      Shelly Coss, MD [facility] Pager [phone]  If 7PM-7AM, please contact night-coverage [email or web address] Password [email or web address] 06/24/2019, 11:50 AM

## 2019-06-24 NOTE — Progress Notes (Signed)
Inpatient Rehabilitation Admissions Coordinator  Noted progress with therapy today. I will follow up with him in the morning.  Danne Baxter, RN, MSN Rehab Admissions Coordinator [phone] 06/24/2019 8:16 PM

## 2019-06-24 NOTE — Progress Notes (Addendum)
[facility] Progress Note   Subjective: Went to endoscopy today. No source of bleeding found. No C/Os.   Objective Vitals:   06/24/19 1007 06/24/19 1015 06/24/19 1025 06/24/19 1045  BP: (!) 131/58 (!) 151/72 (!) 169/85 (!) 159/76  Pulse: 91 89 93 88  Resp: 16 14 18 18   Temp: 97.8 F (36.6 C)     TempSrc: Oral     SpO2: 94% 95% 90% (!) 87%  Weight:      Height:       Physical Exam General: Chronically ill appearing male in NAD Heart: S1,S2, RRR Lungs: Bilateral breath sounds with scattered rhonchi throughout lung fields.  Abdomen: S, NT Extremities: Trace BLE edema Dialysis Access: RIJ [facility] CDI.    Additional Objective Labs: Basic Metabolic Panel: Recent Labs  Lab 06/19/19 0448 06/21/19 0812 06/23/19 0608 06/24/19 0638  NA 130* 132* 133* 133*  K 4.5 3.4* 4.5 4.7  CL 92* 94* 96* 97*  CO2 22 25 24 22   GLUCOSE 102* 94 134* 96  BUN 72* 39* 52* 27*  CREATININE 11.28* 6.99* 8.82* 5.63*  CALCIUM 9.1 8.7* 8.9 8.4*  PHOS 4.9* 3.1  --   --    Liver Function Tests: Recent Labs  Lab 06/21/19 0812  ALBUMIN 1.9*   No results for input(s): LIPASE, AMYLASE in the last 168 hours. CBC: Recent Labs  Lab 06/19/19 0448 06/21/19 0813  06/22/19 1101  06/23/19 0608 06/23/19 1847 06/24/19 0638  WBC 13.3* 7.6  --  23.1*  --  21.1*  --  11.8*  NEUTROABS 8.9*  --   --  18.5*  --  17.2*  --   --   HGB 8.0* 7.7*   < > 6.2*   < > 6.8* 8.2* 7.9*  HCT 24.7* 23.9*   < > 19.2*   < > 20.9* 25.0* 24.8*  MCV 95.7 94.5  --  95.5  --  91.7  --  91.2  PLT 454* 341  --  303  --  291  --  286   < > = values in this interval not displayed.   Blood Culture    Component Value Date/Time   SDES WOUND 06/17/2019 1512   SPECREQUEST L4 L5 DISC ASPIRATION 06/17/2019 1512   CULT  06/17/2019 1512    No growth aerobically or anaerobically. Performed at [facility], [place] [address], [place], [place] [address]    REPTSTATUS 06/22/2019 FINAL 06/17/2019 1512     Cardiac Enzymes: No results for input(s): CKTOTAL, CKMB, CKMBINDEX, TROPONINI in the last 168 hours. CBG: No results for input(s): GLUCAP in the last 168 hours. Iron Studies: No results for input(s): IRON, TIBC, TRANSFERRIN, FERRITIN in the last 72 hours. @lablastinr3 @ Studies/Results: Ct Angio Abd/pel W/ And/or W/o  Result Date: 06/22/2019 CLINICAL DATA:  58 year old male with drop in hemoglobin and bilateral leg weakness. Aortic stent placed several weeks ago. EXAM: CTA ABDOMEN AND PELVIS WITHOUT AND WITH CONTRAST TECHNIQUE: Multidetector CT imaging of the abdomen and pelvis was performed using the standard protocol during bolus administration of intravenous contrast. Multiplanar reconstructed images and MIPs were obtained and reviewed to evaluate the vascular anatomy. CONTRAST:  124mL OMNIPAQUE IOHEXOL 350 MG/ML SOLN COMPARISON:  06/13/2019 CT from [facility] and other prior studies. FINDINGS: VASCULAR Aorta: A thoracic aortic stent graft is again noted without interval change or adjacent hematoma. A 3.5 cm infrarenal suprailiac abdominal aortic aneurysm is again noted without dissection or periaortic hematoma. Celiac: Patent with moderate atherosclerotic  plaque at the origin. SMA: Patent with mild atherosclerotic plaque at the origin Renals: Patent with atherosclerotic plaque at the origins IMA: Patent with atherosclerotic plaque at the origin Review of the MIP images confirms the above findings. NON-VASCULAR Lower chest: No acute abnormality. Hepatobiliary: The liver and gallbladder are unremarkable. No biliary dilatation. Pancreas: Unremarkable Spleen: Unremarkable Adrenals/Urinary Tract: Mildly atrophic kidneys noted with too small to characterize renal lesions again noted. Stomach/Bowel: There is no evidence of bowel obstruction. No definite bowel wall thickening identified. The appendix is not identified. A large amount of stool within the rectum is noted with mild circumferential  rectal wall thickening. Lymphatic: No enlarged lymph nodes identified. Reproductive: No definite abnormality Other: A small amount of ascites noted as well as edema within the subcutaneous, abdominal and pelvic fat. Moderate ill-defined fluid in the presacral/precoccygeal region is noted, significantly increased from 06/13/2019. Musculoskeletal: No acute or suspicious bony abnormality. Moderate degenerative disc disease, spondylosis and broad-based disc bulges contribute to moderate to severe central spinal and biforaminal narrowing at L4-5 and mild to moderate central spinal and biforaminal narrowing at L5-S1. IMPRESSION: VASCULAR Unchanged appearance of the visualized aorta with thoracic aortic stent graft and 3.5 cm abdominal aortic aneurysm. No evidence of aortic dissection or periaortic hematoma/hemorrhage. NON-VASCULAR Increased edema within the subcutaneous, abdominal and pelvic fat. New moderate ill-defined fluid in the presacral/pre coccygeal space most likely represents edema. Small amount of ascites. Degenerative changes within the LOWER lumbar spine contributing to moderate to severe central spinal and biforaminal narrowing at L4-5 and mild to moderate central spinal and biforaminal narrowing at L5-S1. Large amount of rectal stool. No other acute abnormality identified. Electronically Signed   By: Margarette Canada M.D.   On: 06/22/2019 16:33   Medications: . ceFEPime (MAXIPIME) IV Stopped (06/21/19 1837)  . vancomycin 1,000 mg (06/24/19 1116)   . aspirin EC  81 mg Oral Daily  . atorvastatin  20 mg Oral q1800  . darbepoetin (ARANESP) injection - DIALYSIS  100 mcg Intravenous Q Sat-HD  . ferric citrate  630 mg Oral TID WC  . pantoprazole  40 mg Oral Q0600  . polyethylene glycol  17 g Oral Daily  . psyllium  1 packet Oral Daily  . sodium chloride flush  3 mL Intravenous Q12H     Dialysis Orders: [facility]  4h 64min 400/1.5 87.5kg 2/2.25 RIJ TDC Hep 3000  - Parsabiv 2.5mg  IV q HD  - Calcitriol  1.21mcg PO q HD   Assessment/Plan: 1. L4-5 lumbar diskitis: s/p recent aortic stent graft.  BC neg from [place], repeat 8/23 w/NGTD. Needs IV Vanc/Cefepime x6wks per ID 2. Hematochezia suspected GIB - GI consulted.Went to endo today. Flex Sig- No bleeding but stigmata of recent bleeding found, diffusely ulcerated rectum. Upper endo with few non-bleeding erosions in gastric antrum. No active bleeding found. Recommended miralax, carafate however ESRD pts should not take Carafate longer than 2 weeks D/T potential for aluminum toxicity.  3. ESRD -on HD TTS.K 4.5.HD today to resume regular schedule. NO heparin 4. Spinal cord infarct: per spinal MRI 8/23, after complaints of not being able to move legs. Neurosurgery consulted, no plan for intervention, recommend daily ASA. CIR to evaluate for possible placement.  5. Hx Type B aortic dissection (s/p stent graft repair 8/14) - goal SBP per VVS 120-170. Known severe resistant HTN. Trying to avoid BP drops on HD. On ASA 6. Anemia of CKD-Hgb down to 6.8, s/p 2 unit pRBC overnight.  HGB now 7.9. Increase Aranesp to  150 mcg IV q wk 7. Secondary hyperparathyroidism -Ca/phos in goal. Continue calcitriol. Parsabiv not on hospital formulary.  Resumed Auryxia at lower dose.  8. HTN/volume -BPin goal this AM, HTN meds stopped.Does not appear volume overloaded on exam. Has trace BLE edema, lung sounds junky. HD yesterday off schedule, Net UF only 576 still above OP EDW. Attempt 1.5-2.5 liters in HD today.  9. Nutrition -Renal diet w/fluid restrictions. Alb 1.9. protein supplements, vit 10. Recent MSSA bacteremia/AVG infection - Hx infected AVG s/p revision in 5/20, then partial resection in 6/20, then all AVG material (x2) removed 6/30. S/p 6 weeks Ancef completed.   Rita H. Brown NP-C 06/24/2019, 1:40 PM  [facility] [phone]  Pt seen, examined and agree w A/P as above.  [name]  MD 06/24/2019, 3:41 PM

## 2019-06-24 NOTE — Progress Notes (Signed)
Physical Therapy Treatment Patient Details Name: Frank Fuller MRN: [id number] DOB: Dec 27, 1960 Today's Date: 06/24/2019    History of Present Illness Patient is a 58 year old male with history of hypertension, ESRD on dialysis( Tuesday,Thursday,Saturday), abdominal aortic aneurysm status post stent, hepatitis C virus status post treatment, depression, CVA who initially presented to [facility] with multiple complaints.  He was recently hospitalized from 8/11-8/20 for dissection of thoracic aorta(type B) and was treated with endovascular stent graft with spinal drain by Dr. Oneida Alar on 8/14.  He was discharged home but immediately came back with complaints of urge to use the restroom but unable to do so.  He also had fever.  He was found to have paraplegia.  He was suspected to have thoracic ischemia.  CT imaging also showed possible osteomyelitis/discitis of L4-L5.  Also suspected to have sepsis secondary to possible pneumonia. Underwent disc aspiration by IR on 06/18/2019    PT Comments    Continuing work on functional mobility and activity tolerance;  Pt more alert and ready to work with therapy today. Pt eager to show OT and PT that he can wiggle his toes. Pt making progress with functional goals and was able to sit EOB mod A + 2 for bed mobility for selfcare tasks min A. Used Stedy to transfer pt to recliner. Attempted x 2 for sit - stand from EOB with Stedy. Pt heavily reliant on B UEs and Poor trunk control with heavy flexion/leaing  forward. We discussed the short goal of tolerating at least one hour in the chair and the stretch goal of being able to stay in the chair until after dinner; Placed hoyer pad in chair for nursing staff to get pt back to bed later.   Showing good progress compared to last PT sessions, and he seemed pleased with being OOB; would really recommend we continue to consider CIR.        Follow Up Recommendations  CIR     Equipment Recommendations  Wheelchair  (measurements PT);Wheelchair cushion (measurements PT)(to be determined)    Recommendations for Other Services       Precautions / Restrictions Precautions Precautions: Fall    Mobility  Bed Mobility Overal bed mobility: Needs Assistance Bed Mobility: Rolling;Sidelying to Sit Rolling: Min assist Sidelying to sit: Mod assist;+2 for physical assistance       General bed mobility comments: used pads to roll towards EOB, mod A to elevate trunk  Transfers Overall transfer level: Needs assistance   Transfers: Sit to/from Stand Sit to Stand: +2 physical assistance;Max assist         General transfer comment: attempted x 2 for sit - stand from EOB with Stedy. Pt heavily reliant on B UEs and Poor trunk control with heavy flexion/leaing  forward over front bar of stedy; successful standing the 3rd attempt with bed elevated  Ambulation/Gait                 Stairs             Wheelchair Mobility    Modified Rankin (Stroke Patients Only)       Balance Overall balance assessment: Needs assistance Sitting-balance support: Feet supported Sitting balance-Leahy Scale: Fair Sitting balance - Comments: Poor initially sitting EOB, progressed to Fair   Standing balance support: Bilateral upper extremity supported;During functional activity Standing balance-Leahy Scale: Zero Standing balance comment: heavily reliant on UEs  Cognition Arousal/Alertness: Awake/alert Behavior During Therapy: WFL for tasks assessed/performed Overall Cognitive Status: Within Functional Limits for tasks assessed                                        Exercises      General Comments        Pertinent Vitals/Pain Pain Assessment: Faces Faces Pain Scale: Hurts little more Pain Location: genearlized Pain Descriptors / Indicators: Grimacing Pain Intervention(s): Monitored during session    Home Living                       Prior Function            PT Goals (current goals can now be found in the care plan section) Acute Rehab PT Goals Patient Stated Goal: get home PT Goal Formulation: With patient Time For Goal Achievement: 07/02/19 Potential to Achieve Goals: Fair Progress towards PT goals: Progressing toward goals    Frequency    Min 3X/week      PT Plan Current plan remains appropriate    Co-evaluation PT/OT/SLP Co-Evaluation/Treatment: Yes Reason for Co-Treatment: Complexity of the patient's impairments (multi-system involvement);For patient/therapist safety;To address functional/ADL transfers;Necessary to address cognition/behavior during functional activity PT goals addressed during session: Mobility/safety with mobility OT goals addressed during session: ADL's and self-care;Proper use of Adaptive equipment and DME      AM-PAC PT "6 Clicks" Mobility   Outcome Measure  Help needed turning from your back to your side while in a flat bed without using bedrails?: A Little Help needed moving from lying on your back to sitting on the side of a flat bed without using bedrails?: A Lot Help needed moving to and from a bed to a chair (including a wheelchair)?: Total Help needed standing up from a chair using your arms (e.g., wheelchair or bedside chair)?: A Lot Help needed to walk in hospital room?: Total Help needed climbing 3-5 steps with a railing? : Total 6 Click Score: 10    End of Session Equipment Utilized During Treatment: Gait belt Activity Tolerance: Patient tolerated treatment well Patient left: in chair;with call bell/phone within reach;with chair alarm set Nurse Communication: Mobility status;Need for lift equipment PT Visit Diagnosis: Unsteadiness on feet (R26.81);Muscle weakness (generalized) (M62.81);Other abnormalities of gait and mobility (R26.89);Pain Pain - Right/Left: Left(B LEs) Pain - part of body: Leg;Ankle and joints of foot     Time: [id number] PT Time  Calculation (min) (ACUTE ONLY): 29 min  Charges:  $Therapeutic Activity: 8-22 mins                     [name], PT  Acute Rehabilitation Services Pager [phone] Office [place] 06/24/2019, 4:06 PM

## 2019-06-24 NOTE — Plan of Care (Signed)
  Problem: Health Behavior/Discharge Planning: Goal: Ability to manage health-related needs will improve Outcome: Progressing   

## 2019-06-24 NOTE — Op Note (Signed)
[facility] [address] Patient Name: Frank Fuller Procedure Date : 06/24/2019 MRN: [id number] Attending MD: Thornton Park MD, MD Date of Birth: 1961/09/16 CSN: [id number] Age: 58 Admit Type: Inpatient Procedure:                Upper GI endoscopy Indications:              Melena Providers:                Thornton Park MD, MD, Ashley Jacobs, RN, Cletis Athens, Technician, Edmonia James, CRNA Referring MD:              Medicines:                See the Anesthesia note for documentation of the                            administered medications Complications:            No immediate complications. Estimated blood loss:                            Minimal. Estimated Blood Loss:     Estimated blood loss was minimal. Procedure:                Pre-Anesthesia Assessment:                           - Prior to the procedure, a History and Physical                            was performed, and patient medications and                            allergies were reviewed. The patient's tolerance of                            previous anesthesia was also reviewed. The risks                            and benefits of the procedure and the sedation                            options and risks were discussed with the patient.                            All questions were answered, and informed consent                            was obtained. Prior Anticoagulants: The patient has                            taken no previous anticoagulant or antiplatelet                            agents.  ASA Grade Assessment: III - A patient with                            severe systemic disease. After reviewing the risks                            and benefits, the patient was deemed in                            satisfactory condition to undergo the procedure.                           After obtaining informed consent, the endoscope was                            passed under direct  vision. Throughout the                            procedure, the patient's blood pressure, pulse, and                            oxygen saturations were monitored continuously. The                            [id number]) Olympus gastroscope was                            introduced through the mouth, and advanced to the                            third part of duodenum. The upper GI endoscopy was                            accomplished without difficulty. The patient                            tolerated the procedure well. Scope In: Scope Out: Findings:      The examined esophagus was mildly tortuous. Three prominent cords are       present in the mid and distal esophagus. They are not continuous and I       see no obvious vein.      There is diffuse erythema in the body and fundus. A few localized,       medium non-bleeding erosions were found in the gastric antrum. There       were no stigmata of recent bleeding. Biopsies were taken from the antrum       and fundus with a cold forceps for histology. Estimated blood loss was       minimal. No portal hypertensive gastropathy or gastric varices were       identified.      Diffuse mildly congested mucosa without active bleeding and with no       stigmata of bleeding was found in the duodenal bulb and in the first       portion of the duodenum. This is of unclear clinical significance.  Biopsies were taken with a cold forceps for histology. Estimated blood       loss was minimal.      The cardia and gastric fundus were normal on retroflexion. Impression:               - Three prominent cords in the mid and distal                            esophagus of unclear clinical significance.                            Although these may be varices, the appearance is                            atypical.                           - Non-bleeding erosive gastropathy with erosions.                            Biopsied.                            - Congested duodenal mucosa of unclear clinical                            significance. Biopsied. Recommendation:           - Return patient to hospital ward for ongoing care.                           - Resume regular diet today.                           - Continue present medications.                           - Await pathology results. Procedure Code(s):        --- Professional ---                           [phone], Esophagogastroduodenoscopy, flexible,                            transoral; with biopsy, single or multiple Diagnosis Code(s):        --- Professional ---                           K31.89, Other diseases of stomach and duodenum                           K92.1, Melena (includes Hematochezia) CPT copyright 2019 [employer]. All rights reserved. The codes documented in this report are preliminary and upon coder review may  be revised to meet current compliance requirements. Thornton Park MD, MD 06/24/2019 10:17:09 AM This report has been signed electronically. Number of Addenda: 0

## 2019-06-24 NOTE — Plan of Care (Signed)
  Problem: Education: Goal: Knowledge of General Education information will improve Description Including pain rating scale, medication(s)/side effects and non-pharmacologic comfort measures Outcome: Progressing   

## 2019-06-24 NOTE — Anesthesia Procedure Notes (Signed)
Procedure Name: MAC Performed by: Valda Favia, CRNA Pre-anesthesia Checklist: Patient identified, Emergency Drugs available, Suction available, Patient being monitored and Timeout performed Oxygen Delivery Method: Nasal cannula Induction Type: IV induction Airway Equipment and Method: Bite block Placement Confirmation: positive ETCO2 Dental Injury: Teeth and Oropharynx as per pre-operative assessment

## 2019-06-24 NOTE — Progress Notes (Signed)
Occupational Therapy Treatment Patient Details Name: Frank Fuller MRN: [id number] DOB: 05-26-1961 Today's Date: 06/24/2019    History of present illness Patient is a 58 year old male with history of hypertension, ESRD on dialysis( Tuesday,Thursday,Saturday), abdominal aortic aneurysm status post stent, hepatitis C virus status post treatment, depression, CVA who initially presented to [facility] with multiple complaints.  He was recently hospitalized from 8/11-8/20 for dissection of thoracic aorta(type B) and was treated with endovascular stent graft with spinal drain by Dr. Oneida Alar on 8/14.  He was discharged home but immediately came back with complaints of urge to use the restroom but unable to do so.  He also had fever.  He was found to have paraplegia.  He was suspected to have thoracic ischemia.  CT imaging also showed possible osteomyelitis/discitis of L4-L5.  Also suspected to have sepsis secondary to possible pneumonia. Underwent disc aspiration by IR on 06/18/2019   OT comments  Pt more alert and ready to work with therapy today. Pt eager to show.  OT and PT that he can wiggle his toes. Pt making progress with functional goals and was able to sit EOB mod A + 2 for bed mobility for selfcare tasks min A. Used Stedy to transfer pt to recliner. Attempted x 2 for sit - stand from EOB with Stedy. Pt heavily reliant on B UEs and Poor trunk control with haevy flexion/leaing  forward. Placed hoyer pad in chair for nursing staff to get pt back to bed later. OT will continue to follow acutely  Follow Up Recommendations  CIR    Equipment Recommendations  Other (comment)(TBD at next venue of care)    Recommendations for Other Services      Precautions / Restrictions Precautions Precautions: Fall       Mobility Bed Mobility Overal bed mobility: Needs Assistance Bed Mobility: Supine to Sit Rolling: Min assist Sidelying to sit: Mod assist;+2 for physical assistance       General bed  mobility comments: used pads to roll towards EOB, mod A to elevate trunk  Transfers Overall transfer level: Needs assistance   Transfers: Sit to/from Stand Sit to Stand: +2 physical assistance;Max assist         General transfer comment: attempted x 2 for sit - stand from EOB with Stedy. Pt heavily reliant on B UEs and Poor trunk control with haevy flexion/leaing  forward    Balance Overall balance assessment: Needs assistance Sitting-balance support: Feet supported Sitting balance-Leahy Scale: Fair Sitting balance - Comments: Poor initially sitting EOB, progressed to Fair   Standing balance support: Bilateral upper extremity supported;During functional activity Standing balance-Leahy Scale: Zero Standing balance comment: heavily reliant on UEs                           ADL either performed or assessed with clinical judgement   ADL Overall ADL's : Needs assistance/impaired Eating/Feeding: Set up;Sitting;Bed level   Grooming: Wash/dry hands;Wash/dry face;Min guard;Sitting   Upper Body Bathing: Minimal assistance;Sitting Upper Body Bathing Details (indicate cue type and reason): simulated Lower Body Bathing: Moderate assistance;Sitting/lateral leans Lower Body Bathing Details (indicate cue type and reason): simulated         Toilet Transfer: Total assistance;+2 for safety/equipment;+2 for physical assistance Toilet Transfer Details (indicate cue type and reason): used Stdy form bed to recliner Toileting- Clothing Manipulation and Hygiene: Total assistance;Bed level         General ADL Comments: pt more alert motivated to work  with therapy     Vision Patient Visual Report: No change from baseline     Perception     Praxis      Cognition   Behavior During Therapy: WFL for tasks assessed/performed Overall Cognitive Status: Within Functional Limits for tasks assessed                                          Exercises     Shoulder  Instructions       General Comments      Pertinent Vitals/ Pain       Pain Assessment: Faces Faces Pain Scale: Hurts little more Pain Location: genearlized Pain Descriptors / Indicators: Grimacing Pain Intervention(s): Monitored during session;Repositioned  Home Living                                          Prior Functioning/Environment              Frequency           Progress Toward Goals  OT Goals(current goals can now be found in the care plan section)  Progress towards OT goals: Progressing toward goals  Acute Rehab OT Goals Patient Stated Goal: get home  Plan      Co-evaluation    PT/OT/SLP Co-Evaluation/Treatment: Yes Reason for Co-Treatment: Complexity of the patient's impairments (multi-system involvement);Necessary to address cognition/behavior during functional activity;For patient/therapist safety;To address functional/ADL transfers   OT goals addressed during session: ADL's and self-care;Proper use of Adaptive equipment and DME      AM-PAC OT "6 Clicks" Daily Activity     Outcome Measure   Help from another person eating meals?: None Help from another person taking care of personal grooming?: A Little Help from another person toileting, which includes using toliet, bedpan, or urinal?: Total Help from another person bathing (including washing, rinsing, drying)?: A Lot Help from another person to put on and taking off regular upper body clothing?: A Little Help from another person to put on and taking off regular lower body clothing?: Total 6 Click Score: 14    End of Session Equipment Utilized During Treatment: Gait belt;Other (comment)(Stedy)  OT Visit Diagnosis: Muscle weakness (generalized) (M62.81);Pain;Other abnormalities of gait and mobility (R26.89) Pain - part of body: (generalized)   Activity Tolerance Patient limited by fatigue   Patient Left in chair;with call bell/phone within reach;with chair alarm set    Nurse Communication          Time: [id number] OT Time Calculation (min): 29 min  Charges: OT General Charges $OT Visit: 1 Visit OT Treatments $Self Care/Home Management : 8-22 mins     Britt Bottom 06/24/2019, 3:12 PM

## 2019-06-24 NOTE — Transfer of Care (Signed)
Immediate Anesthesia Transfer of Care Note  Patient: Frank Fuller  Procedure(s) Performed: ESOPHAGOGASTRODUODENOSCOPY (EGD) WITH PROPOFOL (N/A ) FLEXIBLE SIGMOIDOSCOPY (N/A ) BIOPSY  Patient Location: Endoscopy Unit  Anesthesia Type:MAC  Level of Consciousness: drowsy  Airway & Oxygen Therapy: Patient Spontanous Breathing and Patient connected to nasal cannula oxygen  Post-op Assessment: Report given to RN and Post -op Vital signs reviewed and stable  Post vital signs: Reviewed and stable  Last Vitals:  Vitals Value Taken Time  BP    Temp    Pulse 91 06/24/19 1007  Resp 18 06/24/19 1007  SpO2 94 % 06/24/19 1007  Vitals shown include unvalidated device data.  Last Pain:  Vitals:   06/24/19 0834  TempSrc: Oral  PainSc: 0-No pain      Patients Stated Pain Goal: 0 (56/15/37 [id number])  Complications: No apparent anesthesia complications

## 2019-06-24 NOTE — Anesthesia Postprocedure Evaluation (Signed)
Anesthesia Post Note  Patient: Frank Fuller  Procedure(s) Performed: ESOPHAGOGASTRODUODENOSCOPY (EGD) WITH PROPOFOL (N/A ) FLEXIBLE SIGMOIDOSCOPY (N/A ) BIOPSY     Patient location during evaluation: PACU Anesthesia Type: MAC Level of consciousness: awake and alert and oriented Pain management: pain level controlled Vital Signs Assessment: post-procedure vital signs reviewed and stable Respiratory status: spontaneous breathing, nonlabored ventilation and respiratory function stable Cardiovascular status: blood pressure returned to baseline Postop Assessment: no apparent nausea or vomiting Anesthetic complications: no    Last Vitals:  Vitals:   06/24/19 1025 06/24/19 1045  BP: (!) 169/85 (!) 159/76  Pulse: 93 88  Resp: 18 18  Temp:    SpO2: 90% (!) 87%    Last Pain:  Vitals:   06/24/19 1007  TempSrc: Oral  PainSc: 0-No pain                 Brennan Bailey

## 2019-06-24 NOTE — Discharge Summary (Signed)
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 1 View  Result Date: 06/04/2019 CLINICAL DATA:  Aortic dissection. EXAM: PORTABLE CHEST 1 VIEW COMPARISON:  Chest x-ray from yesterday. FINDINGS: Unchanged tunneled right internal jugular dialysis catheter and left internal jugular central venous catheter. Stable cardiomediastinal silhouette and changes of aortic dissection with medial displacement of the aortic arch atherosclerotic calcifications. No focal consolidation, pleural effusion, or pneumothorax. No acute osseous abnormality. IMPRESSION: 1. Stable changes of known aortic dissection. Electronically Signed   By: Titus Dubin M.D.   On: 06/04/2019 08:41   Dg Chest Port 1 View  Result Date: 06/03/2019 CLINICAL DATA:  Check central line placement EXAM: PORTABLE CHEST 1 VIEW COMPARISON:  Film from earlier in the same day. FINDINGS: Left jugular central line is now been placed with the catheter tip in the mid superior vena cava. No pneumothorax is seen. Lungs remain clear.  Changes of aortic dissection are again seen. Cardiac shadow is stable. IMPRESSION: No pneumothorax following central line placement. Electronically Signed   By: Inez Catalina M.D.   On: 06/03/2019 20:51   Dg Chest Port 1 View  Result Date: 05/26/2019 CLINICAL DATA:  Shortness of breath. Aortic dissection. EXAM: PORTABLE CHEST 1 VIEW COMPARISON:  CT scan of the chest dated 05/26/2019 and chest x-ray dated 04/22/2019 FINDINGS: Chronic cardiomegaly. Double lumen central venous catheter in good position, unchanged. Pulmonary vascularity is normal. The patchy infiltrate in the posterior aspect of the right upper lobe present on the prior chest x-ray has resolved. The irregular area of infiltrate in the anterior aspect of the right upper lobe noted on the prior CT scan persists. There is slight peribronchial thickening on the left. No effusions. No acute bone abnormality. IMPRESSION: 1. Clearing of the patchy infiltrate in the posterior aspect of the right upper lobe. 2. Persistent small infiltrate in the anterior aspect of the right upper lobe. 3. Bronchitic changes. 4. Chronic cardiomegaly. Electronically Signed   By: Lorriane Shire M.D.   On: 05/26/2019 14:55   Dg Abd Portable 1v  Result Date: 06/21/2019 CLINICAL DATA:  Abdominal pain. EXAM: PORTABLE ABDOMEN - 1 VIEW COMPARISON:  06/13/2019 FINDINGS: A large amount of colonic stool is again noted. No dilated small bowel loops are present. Aortic graft again noted. IMPRESSION: Large amount of colonic stool. No evidence of small bowel obstruction. Electronically Signed   By: Margarette Canada M.D.   On: 06/21/2019 20:35   Ct Angio Chest/abd/pel For Dissection W And/or W/wo  Result Date: 06/03/2019 CLINICAL DATA:  Known dissection, elevated blood pressure EXAM: CT ANGIOGRAPHY CHEST, ABDOMEN AND PELVIS TECHNIQUE: Multidetector CT imaging through the chest, abdomen and pelvis was performed using the standard protocol during bolus administration of intravenous  contrast.  Multiplanar reconstructed images and MIPs were obtained and reviewed to evaluate the vascular anatomy. CONTRAST:  120mL OMNIPAQUE IOHEXOL 350 MG/ML SOLN COMPARISON:  Numerous priors most recent angiography May 30, 2019 and May 26, 2019 FINDINGS: CTA CHEST FINDINGS Cardiovascular: Noncontrast CT images demonstrate displaced intimal calcification of the thoracic aortic arch. The aortic root is incompletely evaluated due to cardiac pulsation artifact though there is gross preservation of the sino-tubular junction. Ascending aorta is normal caliber. There is a common origin of the right brachiocephalic and left common carotid. Post-contrast CT angiographic images demonstrate proximal migration of the previously seen dissection now beginning at the ostium of the left subclavian artery and continuing inferiorly towards the aortic hiatus. There is non opacification of the false lumen within the proximal descending thoracic aorta with progressive narrowing of the true lumen when compared to prior study from May 30, 2019 measuring only 13 x 25 mm in caliber, previously 18 x 28 when measured at a similar level. Conversely there has been increasing aortic caliber now measuring up to 4.5 cm in maximum diameter, previously 4.1 cm. Dissection flap terminus is at the level of the diaphragmatic hiatus with small fenestration. No involvement of the celiac trunk. Mediastinum/Nodes: There is increasing hazy stranding in the region of the mediastinum adjacent the aorta in the region of the ligamentum arteriosum near the [place]xim[place] stones a aortic dissection. No enlarged mediastinal or axillary lymph nodes. Thyroid gland is unremarkable. Secretions are present in the trachea and proximal airways. The esophagus is patulous and fluid-filled. Lungs/Pleura: Dependent atelectatic changes are seen posteriorly. Mild centrilobular emphysema. Increasing size of a left pleural effusion. Airspace opacities are again seen in the anterior (12/66)  and posterolateral right upper lobe (12/90), not significantly changed from prior study. Musculoskeletal: No chest wall mass or suspicious bone lesions identified. Review of the MIP images confirms the above findings. CTA ABDOMEN AND PELVIS FINDINGS VASCULAR Aorta: Mixture of calcified and noncalcified atheromatous plaque is present within the abdominal aorta. Aneurysmal outpouching of the infrarenal abdominal aortic aneurysm measures up to 3.5 cm in maximal diameter. Standard branching of the abdominal aorta. No dissection involvement of the celiac trunk ostia. Celiac: Calcification in the proximal celiac axis. No dissection propagation occlusion or stenosis. No vasculitis. SMA: Patent without evidence of aneurysm, dissection, vasculitis or significant stenosis. Renals: Both renal arteries are patent without evidence of aneurysm, dissection, vasculitis, fibromuscular dysplasia or significant stenosis. IMA: Patent without evidence of aneurysm, dissection, vasculitis or significant stenosis. Inflow: Small dissection flap is noted in the proximal left internal iliac artery in the region of irregular atheromatous plaque (10/261) additional dissection flap is present in the proximal right common femoral artery (10/289). Neither is significantly changed from previous exams. Remaining segments in the inflow are heavily diseased but without aneurysm or ectasia or other acute vascular finding. Veins: Major venous structures are unremarkable. Review of the MIP images confirms the above findings. NON-VASCULAR Hepatobiliary: No focal liver abnormality is seen. No gallstones, gallbladder wall thickening, or biliary dilatation. Vicarious extravasation of contrast medium is present within the gallbladder. Pancreas: Unremarkable. No pancreatic ductal dilatation or surrounding inflammatory changes. Spleen: Normal in size without focal abnormality. Adrenals/Urinary Tract: Adrenal glands are unremarkable. Symmetric renal atrophy. No  concerning renal lesions. No hydronephrosis. High attenuation material in the bladder likely excreted contrast. Mild bladder wall thickening, likely related to underdistention. Stomach/Bowel: Distal esophagus, stomach and duodenal sweep are unremarkable. No bowel wall thickening or dilatation. No evidence of obstruction. Scattered colonic diverticula without focal  pericolonic inflammation to suggest diverticulitis. Lymphatic: No enlarged lymph nodes. Reproductive: The prostate and seminal vesicles are unremarkable. Other: No abdominopelvic free fluid or free gas. No bowel containing hernias. Musculoskeletal: Multilevel degenerative changes are present in the imaged portions of the spine. Findings are maximal at L5-S1. Review of the MIP images confirms the above findings. IMPRESSION: 1. Proximal migration of the previously seen dissection now beginning at the ostium of the left subclavian artery and continuing inferiorly towards the aortic hiatus. There is non opacification of the false lumen within the proximal descending thoracic aorta with progressive narrowing of the true lumen. Conversely there has been increasing aortic caliber now measuring up to 4.5 cm in maximum diameter, previously 4.1 cm. 2. Increasing mediastinal hazy stranding adjacent the aortic dissection with enlarging left pleural effusion are features highly concerning for pending rupture. Urgent surgical evaluation is warranted. 3. Dissection flap terminus is at the level of the diaphragmatic hiatus with small fenestration. No involvement of the celiac trunk. 4. Unchanged small dissection flap in the proximal left internal iliac and proximal right common femoral arteries. 5. Persistent airspace opacities in the right upper lobe. 6. Vicarious extravasation of contrast within the gallbladder. 7. Symmetric renal atrophy. 8. Emphysema (ICD10-J43.9). 9. Aortic Atherosclerosis (ICD10-I70.0). 10.  Aortic aneurysm NOS (ICD10-I71.9). These results were  called by telephone at the time of interpretation on 06/03/2019 at 7:12 pm to Dr. Charlesetta Shanks , who verbally acknowledged these results. Electronically Signed   By: Lovena Le M.D.   On: 06/03/2019 19:16   Ct Angio Chest/abd/pel For Dissection W And/or W/wo  Result Date: 05/30/2019 CLINICAL DATA:  Follow-up thoracic aortic dissection EXAM: CT ANGIOGRAPHY CHEST, ABDOMEN AND PELVIS TECHNIQUE: Multidetector CT imaging through the chest, abdomen and pelvis was performed using the standard protocol during bolus administration of intravenous contrast. Multiplanar reconstructed images and MIPs were obtained and reviewed to evaluate the vascular anatomy. CONTRAST:  128mL OMNIPAQUE IOHEXOL 350 MG/ML SOLN COMPARISON:  05/26/2019 FINDINGS: CTA CHEST FINDINGS Cardiovascular: Preferential opacification of the thoracic aorta. No change in caliber or configuration of the dissected descending thoracic aorta, dissection flap originating at the distal descending thoracic aorta and extending to the diaphoretic hiatus, just proximal to the origin of the celiac axis. The descending thoracic aorta measures up to 4.1 x 3.6 cm in the mid descending portion of the vessel. Normal contour and caliber of the aortic root and proximal aorta, with moderate mixed calcific atherosclerosis. There is very limited opacification of the false lumen proximally and there is fenestration distally with retrograde opacification of the false lumen. Mild cardiomegaly. Scattered coronary artery calcifications. No pericardial effusion. Mediastinum/Nodes: No enlarged mediastinal, hilar, or axillary lymph nodes. Thyroid gland, trachea, and esophagus demonstrate no significant findings. Lungs/Pleura: Mild centrilobular emphysema. Trace left pleural effusion. Unchanged irregular opacity in the central right upper lobe (series 3, image 71) and inferior peripheral right upper lobe (series 13, image 90). Bibasilar atelectasis. No pleural effusion or  pneumothorax. Musculoskeletal: No chest wall abnormality. No acute or significant osseous findings. Review of the MIP images confirms the above findings. 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 Standard branching pattern of the abdominal aorta without dissection the involvement of the branch vessel origins. Review of the MIP images confirms the above findings. NON-VASCULAR Hepatobiliary: No solid liver abnormality is seen. No gallstones, gallbladder wall thickening, or biliary dilatation. Pancreas: Unremarkable. No pancreatic  ductal dilatation or surrounding inflammatory changes. Spleen: Normal in size without significant abnormality. Adrenals/Urinary Tract: Adrenal glands are unremarkable. Numerous small bilateral renal lesions, some which are clearly fluid attenuation cysts, others incompletely characterized. These were better evaluated by prior CT dated 04/12/2019. Bladder is unremarkable. Stomach/Bowel: Stomach is within normal limits. Appendix appears normal. No evidence of bowel wall thickening, distention, or inflammatory changes. Lymphatic: No significant vascular findings are present. Reproductive: No mass or other significant abnormality. Other: No abdominal wall hernia or abnormality. No abdominopelvic ascites. Musculoskeletal: No acute or significant osseous findings. Review of the MIP images confirms the above findings. IMPRESSION: 1. No change in caliber or configuration of the dissected descending thoracic aorta, dissection flap originating at the distal descending thoracic aorta and extending to the diaphoretic hiatus, just proximal to the origin of the celiac axis. The descending thoracic aorta measures up to 4.1 x 3.6 cm in the mid descending portion of the vessel. Normal contour and caliber of the aortic root and proximal aorta, with moderate mixed calcific  atherosclerosis. There is very limited opacification of the false lumen proximally and there is fenestration distally with retrograde opacification of the false lumen. 2. 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 3. Mild centrilobular emphysema. Trace left pleural effusion. Unchanged irregular opacity in the central right upper lobe (series 3, image 71) and inferior peripheral right upper lobe (series 13, image 90), generally infectious or inflammatory. Attention on follow-up. 4.  Coronary artery disease. Electronically Signed   By: Eddie Candle M.D.   On: 05/30/2019 08:55   Vas [place] Upper Extremity Venous Duplex  Result Date: 06/09/2019 UPPER VENOUS STUDY  Indications: Edema Risk Factors: None identified. Comparison Study: No prior studies. Performing Technologist: Oliver Hum RVT  Examination Guidelines: A complete evaluation includes B-mode imaging, spectral Doppler, color Doppler, and power Doppler as needed of all accessible portions of each vessel. Bilateral testing is considered an integral part of a complete examination. Limited examinations for reoccurring indications may be performed as noted.  Right Findings: +----------+------------+---------+-----------+----------+-------+ RIGHT     CompressiblePhasicitySpontaneousPropertiesSummary +----------+------------+---------+-----------+----------+-------+ Subclavian    Full       Yes       Yes                      +----------+------------+---------+-----------+----------+-------+  Left Findings: +----------+------------+---------+-----------+----------+-------+ LEFT      CompressiblePhasicitySpontaneousPropertiesSummary +----------+------------+---------+-----------+----------+-------+ IJV           Full       Yes       Yes                      +----------+------------+---------+-----------+----------+-------+ Subclavian     Full       Yes       Yes                      +----------+------------+---------+-----------+----------+-------+ Axillary      Full       Yes       Yes                      +----------+------------+---------+-----------+----------+-------+ Brachial      Full       Yes       Yes                      +----------+------------+---------+-----------+----------+-------+ Radial  Full                                          +----------+------------+---------+-----------+----------+-------+ Ulnar         Full                                          +----------+------------+---------+-----------+----------+-------+ Cephalic      Full                                          +----------+------------+---------+-----------+----------+-------+ Basilic       Full                                          +----------+------------+---------+-----------+----------+-------+  Summary:  Right: No evidence of thrombosis in the subclavian.  Left: No evidence of deep vein thrombosis in the upper extremity. No evidence of superficial vein thrombosis in the upper extremity.  *See table(s) above for measurements and observations.  Diagnosing physician: Servando Snare MD Electronically signed by Servando Snare MD on 06/09/2019 at 2:17:27 PM.    Final    Hybrid Or Imaging (mc Only)  Result Date: 06/06/2019 There is no interpretation for this exam.  This order is for images obtained during a surgical procedure.  Please See "Surgeries" Tab for more information regarding the procedure.   Ir Lumbar Disc Aspiration W/img Guide  Result Date: 06/17/2019 INDICATION: Low back pain.  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 No drainable fluid collection/abscess is identified. EXAM: LUMBAR L4-5 DISC ASPIRATION UNDER FLUOROSCOPY MEDICATIONS: Lidocaine 1% subcutaneous ANESTHESIA/SEDATION: Intravenous Fentanyl 157mcg and Versed 2mg  were administered as conscious  sedation during continuous monitoring of the patient's level of consciousness and physiological / cardiorespiratory status by the radiology RN, with a total moderate sedation time of 15 minutes. PROCEDURE: Informed written consent was obtained from the patient after a thorough discussion of the procedural risks, benefits and alternatives. All questions were addressed. Maximal Sterile Barrier Technique was utilized including caps, mask, sterile gowns, sterile gloves, sterile drape, hand hygiene and skin antiseptic. A timeout was performed prior to the initiation of the procedure. The L4-5 interspace was identified under fluoroscopy, corresponding to previous cross-sectional imaging. An appropriate skin entry site was determined. After local infiltration with 1% lidocaine, a 17 gauge trocar needle was advanced into the interspace from left posterolateral extraforaminal approach. Needle tip position within the interspace confirmed on biplane images. Less than 1 mL of very thick opaque viscous debris were aspirated, sent for the requested laboratory studies. The patient tolerated the procedure well. FLUOROSCOPY TIME:  1.7 minutes; [id number] DAP COMPLICATIONS: None immediate. IMPRESSION: Technically successful lumbar L4-5 disc aspiration under fluoroscopy. Electronically Signed   By: Lucrezia Europe M.D.   On: 06/17/2019 16:42   Ct Angio Abd/pel W/ And/or W/o  Result Date: 06/22/2019 CLINICAL DATA:  58 year old male with drop in hemoglobin and bilateral leg weakness. Aortic stent placed several weeks ago. EXAM: CTA ABDOMEN AND PELVIS WITHOUT AND WITH CONTRAST TECHNIQUE: Multidetector CT imaging of the abdomen and pelvis was performed using the standard  protocol during bolus administration of intravenous contrast. Multiplanar reconstructed images and MIPs were obtained and reviewed to evaluate the vascular anatomy. CONTRAST:  171mL OMNIPAQUE IOHEXOL 350 MG/ML SOLN COMPARISON:  06/13/2019 CT from [facility] and other  prior studies. FINDINGS: VASCULAR Aorta: A thoracic aortic stent graft is again noted without interval change or adjacent hematoma. A 3.5 cm infrarenal suprailiac abdominal aortic aneurysm is again noted without dissection or periaortic hematoma. Celiac: Patent with moderate atherosclerotic plaque at the origin. SMA: Patent with mild atherosclerotic plaque at the origin Renals: Patent with atherosclerotic plaque at the origins IMA: Patent with atherosclerotic plaque at the origin Review of the MIP images confirms the above findings. NON-VASCULAR Lower chest: No acute abnormality. Hepatobiliary: The liver and gallbladder are unremarkable. No biliary dilatation. Pancreas: Unremarkable Spleen: Unremarkable Adrenals/Urinary Tract: Mildly atrophic kidneys noted with too small to characterize renal lesions again noted. Stomach/Bowel: There is no evidence of bowel obstruction. No definite bowel wall thickening identified. The appendix is not identified. A large amount of stool within the rectum is noted with mild circumferential rectal wall thickening. Lymphatic: No enlarged lymph nodes identified. Reproductive: No definite abnormality Other: A small amount of ascites noted as well as edema within the subcutaneous, abdominal and pelvic fat. Moderate ill-defined fluid in the presacral/precoccygeal region is noted, significantly increased from 06/13/2019. Musculoskeletal: No acute or suspicious bony abnormality. Moderate degenerative disc disease, spondylosis and broad-based disc bulges contribute to moderate to severe central spinal and biforaminal narrowing at L4-5 and mild to moderate central spinal and biforaminal narrowing at L5-S1. IMPRESSION: VASCULAR Unchanged appearance of the visualized aorta with thoracic aortic stent graft and 3.5 cm abdominal aortic aneurysm. No evidence of aortic dissection or periaortic hematoma/hemorrhage. NON-VASCULAR Increased edema within the subcutaneous, abdominal and pelvic fat. New  moderate ill-defined fluid in the presacral/pre coccygeal space most likely represents edema. Small amount of ascites. Degenerative changes within the LOWER lumbar spine contributing to moderate to severe central spinal and biforaminal narrowing at L4-5 and mild to moderate central spinal and biforaminal narrowing at L5-S1. Large amount of rectal stool. No other acute abnormality identified. Electronically Signed   By: Margarette Canada M.D.   On: 06/22/2019 16:33    Microbiology: Recent Results (from the past 240 hour(s))  Culture, blood (routine x 2)     Status: None   Collection Time: 06/15/19 11:20 AM   Specimen: BLOOD LEFT HAND  Result Value Ref Range Status   Specimen Description BLOOD LEFT HAND  Final   Special Requests   Final    BOTTLES DRAWN AEROBIC ONLY Blood Culture results may not be optimal due to an inadequate volume of blood received in culture bottles   Culture   Final    NO GROWTH 5 DAYS Performed at [facility], [place] [address] [place], [place] [address]    Report Status 06/20/2019 FINAL  Final  Culture, blood (routine x 2)     Status: None   Collection Time: 06/15/19 11:28 AM   Specimen: BLOOD  Result Value Ref Range Status   Specimen Description BLOOD LEFT ANTECUBITAL  Final   Special Requests   Final    BOTTLES DRAWN AEROBIC ONLY Blood Culture results may not be optimal due to an inadequate volume of blood received in culture bottles   Culture   Final    NO GROWTH 5 DAYS Performed at [facility], [place] [address], [place], [place] [address]    Report Status 06/20/2019 FINAL  Final  Aerobic/Anaerobic Culture (surgical/deep wound)     Status: None   Collection Time: 06/17/19  3:12 PM   Specimen: Intervertebral Disc; Tissue  Result Value Ref Range Status   Specimen Description WOUND  Final   Special Requests L4 L5 DISC ASPIRATION  Final   Gram Stain NO WBC SEEN NO ORGANISMS SEEN   Final   Culture   Final    No growth aerobically or  anaerobically. Performed at [facility], [place] [address], [place], [place] [address]    Report Status 06/22/2019 FINAL  Final     Labs: Basic Metabolic Panel: Recent Labs  Lab 06/18/19 0557 06/19/19 0448 06/21/19 0812 06/23/19 0608 06/24/19 0638  NA 133* 130* 132* 133* 133*  K 4.6 4.5 3.4* 4.5 4.7  CL 95* 92* 94* 96* 97*  CO2 25 22 25 24 22   GLUCOSE 110* 102* 94 134* 96  BUN 55* 72* 39* 52* 27*  CREATININE 9.65* 11.28* 6.99* 8.82* 5.63*  CALCIUM 8.8* 9.1 8.7* 8.9 8.4*  PHOS  --  4.9* 3.1  --   --    Liver Function Tests: Recent Labs  Lab 06/21/19 0812  ALBUMIN 1.9*   No results for input(s): LIPASE, AMYLASE in the last 168 hours. No results for input(s): AMMONIA in the last 168 hours. CBC: Recent Labs  Lab 06/19/19 0448 06/21/19 0813  06/22/19 1101 06/22/19 1700 06/22/19 2320 06/23/19 0608 06/23/19 1847 06/24/19 0638  WBC 13.3* 7.6  --  23.1*  --   --  21.1*  --  11.8*  NEUTROABS 8.9*  --   --  18.5*  --   --  17.2*  --   --   HGB 8.0* 7.7*   < > 6.2* 7.4* 7.0* 6.8* 8.2* 7.9*  HCT 24.7* 23.9*   < > 19.2* 23.2* 21.2* 20.9* 25.0* 24.8*  MCV 95.7 94.5  --  95.5  --   --  91.7  --  91.2  PLT 454* 341  --  303  --   --  291  --  286   < > = values in this interval not displayed.   Cardiac Enzymes: No results for input(s): CKTOTAL, CKMB, CKMBINDEX, TROPONINI in the last 168 hours. BNP: BNP (last 3 results) No results for input(s): BNP in the last 8760 hours.  ProBNP (last 3 results) No results for input(s): PROBNP in the last 8760 hours.  CBG: No results for input(s): GLUCAP in the last 168 hours.     Signed:  Domenic Polite MD.  [facility] 06/24/2019, 12:21 PM

## 2019-06-24 NOTE — Op Note (Signed)
[facility] Patient Name: Frank Fuller Procedure Date : 06/24/2019 MRN: [id number] Attending MD: Thornton Park MD, MD Date of Birth: Apr 09, 1961 CSN: [id number] Age: 58 Admit Type: Inpatient Procedure:                Flexible Sigmoidoscopy Indications:              Hematochezia, Abnormal CT of the GI tract                           Neg CTA except for large rectal stool impaction. CT                            at [facility] showed rectal inflammation.                           History of normal colonoscopy at the [place] in                            [place] a few years ago.                           Recent aortic dissection s/p aortic graft 06/03/19                            complicated by thoracic spine ischemia with                            paraplegia. Providers:                Thornton Park MD, MD, Ashley Jacobs, RN, Cletis Athens, Technician, Edmonia James, CRNA Referring MD:              Medicines:                See the Anesthesia note for documentation of the                            administered medications Complications:            No immediate complications. Estimated blood loss:                            Minimal. Estimated Blood Loss:     Estimated blood loss was minimal. Procedure:                Pre-Anesthesia Assessment:                           - Prior to the procedure, a History and Physical                            was performed, and patient medications and                            allergies were reviewed. The patient's tolerance of  previous anesthesia was also reviewed. The risks                            and benefits of the procedure and the sedation                            options and risks were discussed with the patient.                            All questions were answered, and informed consent                            was obtained. Prior Anticoagulants: The patient has                             taken no previous anticoagulant or antiplatelet                            agents. ASA Grade Assessment: III - A patient with                            severe systemic disease. After reviewing the risks                            and benefits, the patient was deemed in                            satisfactory condition to undergo the procedure.                           - Prior to the procedure, a History and Physical                            was performed, and patient medications and                            allergies were reviewed. The patient's tolerance of                            previous anesthesia was also reviewed. The risks                            and benefits of the procedure and the sedation                            options and risks were discussed with the patient.                            All questions were answered, and informed consent                            was obtained. Prior Anticoagulants: The patient has  taken no previous anticoagulant or antiplatelet                            agents. ASA Grade Assessment: III - A patient with                            severe systemic disease. After reviewing the risks                            and benefits, the patient was deemed in                            satisfactory condition to undergo the procedure.                           After obtaining informed consent, the scope was                            passed under direct vision. The [id number]                            Olympus gastroscope was introduced through the anus                            and advanced to the the sigmoid colon. The flexible                            sigmoidoscopy was accomplished without difficulty.                            The patient tolerated the procedure well. The                            quality of the bowel preparation was adequate. Scope In: Scope Out: Findings:      The  perianal and digital rectal examinations were normal.      The rectum is diffusely ulcerated. No bleeding was present. Stigmata of       recent bleeding were present. Biopsies were taken with a cold forceps       for histology. Estimated blood loss was minimal.      The exam was otherwise without abnormality. No blood or mucosal       abnormality present proximal to the rectum. Impression:               - Multiple ulcers in the rectum related to the                            recent fecal impaction. Biopsied.                           - The examination was otherwise normal. Recommendation:           - High fiber diet.                           -  Await pathology results.                           - Agressive bowel regimen to avoid recurrent fecal                            impaction: Daily stool bulking agent such as                            Metamucil and Miralax 17 g once daily.                           - Carafate enemas 2 g twice a day for 6 weeks to                            treat the ulceration.                           - Avoid constipating medications as able.                           - Consider flex sig with APC therapy if bleeding                            recurrs despite aggressive therapy. Procedure Code(s):        --- Professional ---                           [phone], Sigmoidoscopy, flexible; with biopsy, single                            or multiple Diagnosis Code(s):        --- Professional ---                           K62.6, Ulcer of anus and rectum                           K92.1, Melena (includes Hematochezia)                           R93.3, Abnormal findings on diagnostic imaging of                            other parts of digestive tract CPT copyright 2019 American Medical Association. All rights reserved. The codes documented in this report are preliminary and upon coder review may  be revised to meet current compliance requirements. Thornton Park MD,  MD 06/24/2019 10:54:40 AM This report has been signed electronically. Number of Addenda: 0

## 2019-06-25 ENCOUNTER — Telehealth: Payer: Self-pay | Admitting: *Deleted

## 2019-06-25 LAB — BASIC METABOLIC PANEL
Anion gap: 15 (ref 5–15)
BUN: 15 mg/dL (ref 6–20)
CO2: 25 mmol/L (ref 22–32)
Calcium: 8.8 mg/dL — ABNORMAL LOW (ref 8.9–10.3)
Chloride: 94 mmol/L — ABNORMAL LOW (ref 98–111)
Creatinine, Ser: 4.07 mg/dL — ABNORMAL HIGH (ref 0.61–1.24)
GFR calc Af Amer: 17 mL/min — ABNORMAL LOW (ref >60)
GFR calc non Af Amer: 15 mL/min — ABNORMAL LOW (ref >60)
Glucose, Bld: 92 mg/dL (ref 70–99)
Potassium: 3.8 mmol/L (ref 3.5–5.1)
Sodium: 134 mmol/L — ABNORMAL LOW (ref 135–145)

## 2019-06-25 LAB — CBC WITH DIFFERENTIAL/PLATELET
Abs Immature Granulocytes: 0.15 10*3/uL — ABNORMAL HIGH (ref 0.00–0.07)
Basophils Absolute: 0 10*3/uL (ref 0.0–0.1)
Basophils Relative: 0 %
Eosinophils Absolute: 0.2 10*3/uL (ref 0.0–0.5)
Eosinophils Relative: 2 %
HCT: 25.3 % — ABNORMAL LOW (ref 39.0–52.0)
Hemoglobin: 8.2 g/dL — ABNORMAL LOW (ref 13.0–17.0)
Immature Granulocytes: 1 %
Lymphocytes Relative: 7 %
Lymphs Abs: 0.8 10*3/uL (ref 0.7–4.0)
MCH: 29.6 pg (ref 26.0–34.0)
MCHC: 32.4 g/dL (ref 30.0–36.0)
MCV: 91.3 fL (ref 80.0–100.0)
Monocytes Absolute: 1.5 10*3/uL — ABNORMAL HIGH (ref 0.1–1.0)
Monocytes Relative: 14 %
Neutro Abs: 7.9 10*3/uL — ABNORMAL HIGH (ref 1.7–7.7)
Neutrophils Relative %: 76 %
Platelets: 290 10*3/uL (ref 150–400)
RBC: 2.77 MIL/uL — ABNORMAL LOW (ref 4.22–5.81)
RDW: 16.9 % — ABNORMAL HIGH (ref 11.5–15.5)
WBC: 10.6 10*3/uL — ABNORMAL HIGH (ref 4.0–10.5)
nRBC: 0 % (ref 0.0–0.2)

## 2019-06-25 LAB — HEMOGLOBIN AND HEMATOCRIT, BLOOD
HCT: 23.8 % — ABNORMAL LOW (ref 39.0–52.0)
Hemoglobin: 7.6 g/dL — ABNORMAL LOW (ref 13.0–17.0)

## 2019-06-25 NOTE — Telephone Encounter (Signed)
-----   Message from Elam Dutch, MD sent at 06/25/2019 10:10 AM EDT ----- Pt can go into [facility] on the greenlist  He needs appt with me and CTA chest abdomen pelvis in Feb 2021  He does not need any appts or CTs between now and then.  Please cancel appt and CT for 9/17  Ruta Hinds, MD [facility] [place] Office: [phone] Pager: [phone]

## 2019-06-25 NOTE — Progress Notes (Signed)
PROGRESS NOTE    Frank Fuller  [id number] DOB: Mar 23, 1961 DOA: 06/13/2019 PCP: [facility]   Brief Narrative:   Frank Fuller is a 58 year old male with history of hypertension, ESRD on dialysis (TTS), abdominal aortic aneurysm status post stent, hepatitis C virus status post treatment, depression, CVA who initially presented to [facility] with multiple complaints.  He was recently hospitalized from 8/11-8/20 for dissection of thoracic aorta (type B) and was treated with endovascular stent graft with spinal drain by Dr. Oneida Alar on 8/14.  He was discharged home but immediately came back with complaints of urge to use the restroom but unable to do so.  He also had fever.  He was found to have paraplegia.  He was suspected to have thoracic spinal ischemia.  CT imaging also showed possible osteomyelitis/discitis of L4-L5.  Also suspected to have sepsis secondary to possible pneumonia. Underwent disc aspiration by IR on 06/18/2019.  Neurosurgery, vascular surgery, ID were following.   Hospital course remarkable for multiple  episodes of bloody bowel movement with clots. FOBT positive with acute blood loss anemia. Underwent  EGD and flexible sigmoidoscopy on 06/24/19 with findings of recal ulceration. Waiting for transfer to CIR/SNF.   Assessment & Plan:   Principal Problem:   Lumbar discitis Active Problems:   ESRD on hemodialysis (HCC)   Ileus (HCC)   Proctitis   H/O endovascular stent graft for abdominal aortic aneurysm   Gastric ulcer   Rectal ulceration   Suspected sepsis secondary to pneumonia vs discitis/osteomyelitis:  Presented with fever of 102F, leukocytosis.  ESR 138, CRP /procalcitonin was elevated.  CT also showed possible right-sided pneumonia with Iileus and proctitis.  MRI lumbar spine showed new space-occupying material in the lower retroperitoneum and lumbosacral junction. Favored acute discitis and osteomyelitis at L4-L5 with pervertebral phlegmon. Cultures  from [facility] did not show any growth.  Cultures sent here has not shown any growth.   --IR consulted and underwent disc aspiration 8/25; culture negative --ID consulted, recommended 6 weeks IV vancomycin/cefepime w/ HD; plan end date 07/29/2019 --Afebrile and leukocytosis now has resolved --Plan to check inflammatory markers q2 weeks per ID  GI bleed/abdominal pain:  Erosive gastropathy Rectal ulcers Patient had several episodes of bloody bowel movement with clots and also complained of abdominal pain few days ago .  FOBT positive. CT angiogram did not show any active bleeding or any other acute abnormality.  Underwent  EGD and flexible sigmoidoscopy on 9/1 with finding of nonbleeding erosive gastropathy and multiple ulcers in the rectum due to fecal impaction but no bleeding.  GI recommending daily MiraLAX, Metamucil and Carafate enema for 2 weeks. Awaiting pathology from biopsy.  Acute on chronic normocytic anemia:  Secondary to GI blood loss and ESRD.  Patient received 2 units of PRBCs on 06/23/2019. Hemoglobin this morning is 8.2, stable.  Hypoxia: On presentation he was  noted to be hypoxic with desaturation on room air.Pneumonia suspected.   CT had shown left pleural effusion, proBNP was elevated.  Continue volume control with dialysis. Last chest x-ray shows left base atelectasis.  Currently on room air  Chest pain/elevated troponin:  Elevated troponin but they are flat.  Most likely secondary to supply demand ischemia due to ESRD. Denied any chest pain today.  EKG did not show any significant ST changes  ESRD on dialysis: ESRD on dialysis( Tuesday,Thursday,Saturday).  Nephrology following.    Thoracic aortic dissection:  Status post endovascular stent placement.  Vascular surgery was following  Hypertension:  Antihypertensives  was held due to hypotension.  Please resume when appropriate.  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 Abnormal spinal cord at T9-T10, T10-11, possible segmental cord infarct.  Neurosurgery,neurology was consulted and they signed off.  No plan for intervention. Recommended daily baby aspirin by neurology.   Patient to follow-up with neurology [name] in 4 weeks at [facility]. His strength on bilateral lower extremities have improved. PT recommending CIR. Rehab coordinator/Social worker aware.     DVT prophylaxis: SCDs Code Status: Full code Family Communication: None Disposition Plan: Pending discharge to CIR versus SNF when bed available   Consultants:   Nephrology  Infectious disease - Dr. Megan Salon, signed off 8/28; f/u ID clinic outpatient  Vascular surgery - Dr. Oneida Alar  Neurosurgery - Dr. Arnoldo Morale; signed off 8/26  Neurology - Dr. Erlinda Hong; signed off 8/26; f/u with [name] at [facility] in 4 weeks  Procedures:   IR disc aspiration - 8/25  Flexible sigmoidoscopy 06/24/2019  EGD 06/24/2019  Antimicrobials:   Vancomycin/cefepime with HD with planned end date 07/29/2019.   Subjective: Patient seen and examined at bedside, continues with persistent abdominal discomfort, unchanged since his recent stent placement by vascular surgery.  Gastroenterology PA present.  Tolerating diet with bowel movements.  Awaiting CIR placement.  No other complaints or concerns at this time.  Denies headache, no chest pain, no shortness of breath, no cough/congestion, no fever/chills/night sweats, no paresthesias.  No acute events overnight per nurse staff.  Objective: Vitals:   06/24/19 2121 06/25/19 0503 06/25/19 0908 06/25/19 1337  BP: (!) 166/81 (!) 175/93 (!) 156/92 (!) 168/97  Pulse: 93 97 92 94  Resp: _0 Temp: 98.7 F (37.1 C) 97.9 F (36.6 C) 98.2 F (36.8 C) (!) 100.4 F (38 C)  TempSrc: Oral  Oral Oral  SpO2: 94% 92% 95% 95%  Weight: 90.4 kg     Height:         Intake/Output Summary (Last 24 hours) at 06/25/2019 1400 Last data filed at 06/25/2019 1200 Gross per 24 hour  Intake 420 ml  Output 2500 ml  Net -2080 ml   Filed Weights   06/24/19 1539 06/24/19 2003 06/24/19 2121  Weight: 92.9 kg 90.4 kg 90.4 kg    Examination:  General exam: Appears calm and comfortable  Respiratory system: Clear to auscultation. Respiratory effort normal. Cardiovascular system: S1 & S2 heard, RRR. No JVD, murmurs, rubs, gallops or clicks. No pedal edema. Gastrointestinal system: Abdomen is nondistended, soft, mild bilateral lower quadrant tenderness, No organomegaly or masses felt. Normal bowel sounds heard. Central nervous system: Alert and oriented. No focal neurological deficits. Extremities: Symmetric 5 x 5 power. Skin: No rashes, lesions or ulcers Psychiatry: Judgement and insight appear normal. Mood & affect appropriate.     Data Reviewed: I have personally reviewed following labs and imaging studies  CBC: Recent Labs  Lab 06/19/19 0448 06/21/19 0813  06/22/19 1101  06/22/19 2320 06/23/19 0608 06/23/19 1847 06/24/19 0638 06/25/19 0316  WBC 13.3* 7.6  --  23.1*  --   --  21.1*  --  11.8* 10.6*  NEUTROABS 8.9*  --   --  18.5*  --   --  17.2*  --   --  7.9*  HGB 8.0* 7.7*   < > 6.2*   < > 7.0* 6.8* 8.2* 7.9* 8.2*  HCT 24.7* 23.9*   < > 19.2*   < >  21.2* 20.9* 25.0* 24.8* 25.3*  MCV 95.7 94.5  --  95.5  --   --  91.7  --  91.2 91.3  PLT 454* 341  --  303  --   --  291  --  286 290   < > = values in this interval not displayed.   Basic Metabolic Panel: Recent Labs  Lab 06/19/19 0448 06/21/19 0812 06/23/19 0608 06/24/19 0638 06/25/19 0316  NA 130* 132* 133* 133* 134*  K 4.5 3.4* 4.5 4.7 3.8  CL 92* 94* 96* 97* 94*  CO2 _0 GLUCOSE 102* 94 134* 96 92  BUN 72* 39* 52* 27* 15  CREATININE 11.28* 6.99* 8.82* 5.63* 4.07*  CALCIUM 9.1 8.7* 8.9 8.4* 8.8*  PHOS 4.9* 3.1  --   --   --    GFR: Estimated Creatinine Clearance: 23.7  mL/min (A) (by C-G formula based on SCr of 4.07 mg/dL (H)). Liver Function Tests: Recent Labs  Lab 06/21/19 0812  ALBUMIN 1.9*   No results for input(s): LIPASE, AMYLASE in the last 168 hours. No results for input(s): AMMONIA in the last 168 hours. Coagulation Profile: No results for input(s): INR, PROTIME in the last 168 hours. Cardiac Enzymes: No results for input(s): CKTOTAL, CKMB, CKMBINDEX, TROPONINI in the last 168 hours. BNP (last 3 results) No results for input(s): PROBNP in the last 8760 hours. HbA1C: No results for input(s): HGBA1C in the last 72 hours. CBG: No results for input(s): GLUCAP in the last 168 hours. Lipid Profile: No results for input(s): CHOL, HDL, LDLCALC, TRIG, CHOLHDL, LDLDIRECT in the last 72 hours. Thyroid Function Tests: No results for input(s): TSH, T4TOTAL, FREET4, T3FREE, THYROIDAB in the last 72 hours. Anemia Panel: No results for input(s): VITAMINB12, FOLATE, FERRITIN, TIBC, IRON, RETICCTPCT in the last 72 hours. Sepsis Labs: No results for input(s): PROCALCITON, LATICACIDVEN in the last 168 hours.  Recent Results (from the past 240 hour(s))  Aerobic/Anaerobic Culture (surgical/deep wound)     Status: None   Collection Time: 06/17/19  3:12 PM   Specimen: Intervertebral Disc; Tissue  Result Value Ref Range Status   Specimen Description WOUND  Final   Special Requests L4 L5 DISC ASPIRATION  Final   Gram Stain NO WBC SEEN NO ORGANISMS SEEN   Final   Culture   Final    No growth aerobically or anaerobically. Performed at [facility], [place] [address] [place], [place] [address]    Report Status 06/22/2019 FINAL  Final         Radiology Studies: No results found.      Scheduled Meds: . aspirin EC  81 mg Oral Daily  . atorvastatin  20 mg Oral q1800  . [START ON 06/28/2019] darbepoetin (ARANESP) injection - DIALYSIS  150 mcg Intravenous Q Sat-HD  . ferric citrate  210 mg Oral TID WC  . pantoprazole  40 mg Oral Q0600  .  polyethylene glycol  17 g Oral Daily  . sodium chloride flush  3 mL Intravenous Q12H  . Sucralfate Enema (sucralfate 2g/sterile water 61m enema)  2 g Rectal BID   Continuous Infusions: . ceFEPime (MAXIPIME) IV 2 g (06/24/19 1933)  . vancomycin 1,000 mg (06/24/19 1116)     LOS: 12 days    Time spent: 36 minutes spent on chart review, discussion with nursing staff, consultants, personally reviewing all imaging and labs, updating family and interview/physical exam; more than 50% of that time was spent in counseling and/or  coordination of care.     J [place] DO [facility] Pager [phone]  If 7PM-7AM, please contact night-coverage [email or web address] Password [email or web address] 06/25/2019, 2:00 PM

## 2019-06-25 NOTE — Telephone Encounter (Signed)
FOR PLANNING OF FUTURE APPTS

## 2019-06-25 NOTE — Progress Notes (Signed)
Before giving the patient the enema patient had bloody stool. After cleaning the patient, he was bleeding from rectum.   I insert the enema. Gastroenterologist was notify. Will continue to monitor patient.

## 2019-06-25 NOTE — Progress Notes (Signed)
Inpatient Rehabilitation Admissions Coordinator  I met with patient at bedside to discuss again the goals, expectations, cost and benefits of an inpt rehab admit rather than SNF. Pt did sit in chair yesterday for an hour for the first time. He wishes to discuss first with his RN today before further deciding his rehab venue. I updated therapy, RN, and SW. I will follow up today.  Danne Baxter, RN, MSN Rehab Admissions Coordinator [phone] 06/25/2019 11:18 AM

## 2019-06-25 NOTE — Progress Notes (Signed)
[facility] [place]  Subjective  -about the same   Objective (!) 156/92 92 98.2 F (36.8 C) (Oral) 18 95%  Intake/Output Summary (Last 24 hours) at 06/25/2019 1008 Last data filed at 06/25/2019 0503 Gross per 24 hour  Intake 120 ml  Output 2500 ml  Net -2380 ml   Moves feet and ankles  Assessment/Planning: Postop day 19 status post repair of aortic dissection with thoracic stent graft  Patient still with paraplegia symptoms.  Hopefully this will improve some with rehab.  Endoscopy results noted.  Some of his bowel dysfunction and constipation is probably secondary to his paraplegia.  We will follow-up with patient next week.  He will need a repeat CT angio of the chest abdomen and pelvis in February 2021.  Ruta Hinds 06/25/2019 10:08 AM --  Laboratory Lab Results: Recent Labs    06/24/19 0638 06/25/19 0316  WBC 11.8* 10.6*  HGB 7.9* 8.2*  HCT 24.8* 25.3*  PLT 286 290   BMET Recent Labs    06/24/19 0638 06/25/19 0316  NA 133* 134*  K 4.7 3.8  CL 97* 94*  CO2 22 25  GLUCOSE 96 92  BUN 27* 15  CREATININE 5.63* 4.07*  CALCIUM 8.4* 8.8*    COAG Lab Results  Component Value Date   INR 1.3 (H) 06/17/2019   INR 1.3 (H) 06/05/2019   INR 1.3 (H) 06/03/2019   No results found for: PTT

## 2019-06-25 NOTE — Progress Notes (Addendum)
[facility] Progress Note   Subjective: Patient awake, alert, No C/Os. Trying to decide where to go to rehab. No rectal bleeding reported. Moving LEs to command.     Objective Vitals:   06/24/19 2003 06/24/19 2121 06/25/19 0503 06/25/19 0908  BP: (!) 160/79 (!) 166/81 (!) 175/93 (!) 156/92  Pulse: 95 93 97 92  Resp: 18 17 16 18   Temp: 98.4 F (36.9 C) 98.7 F (37.1 C) 97.9 F (36.6 C) 98.2 F (36.8 C)  TempSrc: Oral Oral  Oral  SpO2: 95% 94% 92% 95%  Weight: 90.4 kg 90.4 kg    Height:       Physical Exam General: Pleasant older man in NAD Heart:RRR Lungs:CTAB, nml WOB Abdomen:soft, +tenderness, +BS Extremities: trace BLE edema Dialysis Access: R IJ TDC drsg CDI  :  Additional Objective Labs: Basic Metabolic Panel: Recent Labs  Lab 06/19/19 0448 06/21/19 0812 06/23/19 0608 06/24/19 0638 06/25/19 0316  NA 130* 132* 133* 133* 134*  K 4.5 3.4* 4.5 4.7 3.8  CL 92* 94* 96* 97* 94*  CO2 22 25 24 22 25   GLUCOSE 102* 94 134* 96 92  BUN 72* 39* 52* 27* 15  CREATININE 11.28* 6.99* 8.82* 5.63* 4.07*  CALCIUM 9.1 8.7* 8.9 8.4* 8.8*  PHOS 4.9* 3.1  --   --   --    Liver Function Tests: Recent Labs  Lab 06/21/19 0812  ALBUMIN 1.9*   No results for input(s): LIPASE, AMYLASE in the last 168 hours. CBC: Recent Labs  Lab 06/21/19 0813  06/22/19 1101  06/23/19 0608 06/23/19 1847 06/24/19 0638 06/25/19 0316  WBC 7.6  --  23.1*  --  21.1*  --  11.8* 10.6*  NEUTROABS  --   --  18.5*  --  17.2*  --   --  7.9*  HGB 7.7*   < > 6.2*   < > 6.8* 8.2* 7.9* 8.2*  HCT 23.9*   < > 19.2*   < > 20.9* 25.0* 24.8* 25.3*  MCV 94.5  --  95.5  --  91.7  --  91.2 91.3  PLT 341  --  303  --  291  --  286 290   < > = values in this interval not displayed.   Blood Culture    Component Value Date/Time   SDES WOUND 06/17/2019 1512   SPECREQUEST L4 L5 DISC ASPIRATION 06/17/2019 1512   CULT  06/17/2019 1512    No growth aerobically or anaerobically. Performed at  [facility], [place] [address], [place], [place] [address]    REPTSTATUS 06/22/2019 FINAL 06/17/2019 1512    Cardiac Enzymes: No results for input(s): CKTOTAL, CKMB, CKMBINDEX, TROPONINI in the last 168 hours. CBG: No results for input(s): GLUCAP in the last 168 hours. Iron Studies: No results for input(s): IRON, TIBC, TRANSFERRIN, FERRITIN in the last 72 hours. @lablastinr3 @ Studies/Results: No results found. Medications: . ceFEPime (MAXIPIME) IV 2 g (06/24/19 1933)  . vancomycin 1,000 mg (06/24/19 1116)   . aspirin EC  81 mg Oral Daily  . atorvastatin  20 mg Oral q1800  . [START ON 06/28/2019] darbepoetin (ARANESP) injection - DIALYSIS  150 mcg Intravenous Q Sat-HD  . ferric citrate  210 mg Oral TID WC  . pantoprazole  40 mg Oral Q0600  . polyethylene glycol  17 g Oral Daily  . sodium chloride flush  3 mL Intravenous Q12H  . Sucralfate Enema (sucralfate 2g/sterile water 60ml enema)  2 g Rectal BID  Dialysis Orders: [facility]  4h 27min 400/1.5 87.5kg 2/2.25 RIJ TDC  - No heparin -Parsabiv 2.5mg  IV q HD  - Calcitriol 1.29mcg PO q HD  Assessment/Plan: 1. L4-5 lumbar diskitis: s/p recent aortic stent graft.  BC neg from [facility], repeat 8/23 w/NGTD. Needs IV Vanc/Cefepime x6wks per ID 2. Spinal cord infarct: per spinal MRI 8/23, after complaints of not being able to move legs. Neurosurgery consulted, no plan for intervention, recommend daily ASA. CIR to evaluate for possible placement.  3. Hematocheziasuspected GIB- GI consulted.Went to endo 06/24/2019. Flex Sig- No bleeding but stigmata of recent bleeding found, diffusely ulcerated rectum. Upper endo with few non-bleeding erosions in gastric antrum. No active bleeding found. Recommended miralax, carafate however ESRD pts should not take Carafate longer than 2 weeks D/T potential for aluminum toxicity.  4. ESRD -on HD TTS.K 4.5.HD 06/26/19 regular schedule. NO heparin 5. Recent Type B aortic dissection (s/p stent  graft repair 8/14) - goal SBP per VVS 120-170. Known severe resistant HTN. Trying to avoid BP drops on HD. On ASA 6. Anemia of CKD-Hgbdown to 6.8,s/p 2 unit pRBC overnight.HGB now 7.9. Increase Aranesp to 150 mcg IV q wk 7. Secondary hyperparathyroidism -Ca/phos in goal. Continue calcitriol. Parsabiv not on hospital formulary.Resumed Auryxia at lower dose. Auryxia typically causes less constipation than other binders-frequently used for patient with intolerance of binders D/T constipation (Renvela/Calcium acetate). If GI concerned about use of ferric citrate, can change to fosrenol if patient agrees.  8. HTN/volume -BP higher today. BP meds have been stopped but may need to resume. Trying to avoid hypotension. HD yesterday pre wt 92.9 Net UF 2.5 liters Post wt 90.4 kg. Still above OP EDW, still trace LE edema. Continue lowering volume as tolerating.  9. Nutrition -Renal diet w/fluid restrictions. Alb 1.9. protein supplements, vit 10. Recent MSSA bacteremia/AVG infection - Hx infected AVG s/p revision in 5/20, then partial resection in 6/20, then all AVG material (x2) removed 6/30. S/p 6 weeks Ancef completed.  Rita H. Brown NP-C 06/25/2019, 11:41 AM  [facility] [phone]  Pt seen, examined and agree w A/P as above.  [name]  MD 06/25/2019, 12:19 PM

## 2019-06-25 NOTE — Plan of Care (Signed)
  Problem: Clinical Measurements: Goal: Ability to maintain clinical measurements within normal limits will improve Outcome: Progressing   

## 2019-06-25 NOTE — Progress Notes (Signed)
Daily Rounding Note  06/25/2019, 9:31 AM  LOS: 12 days   SUBJECTIVE:   Chief complaint: Bloody stool, melena, acute on chronic anemia.  Abdominal pain.     No further bleeding.  Stools brown.  Persistent diffuse abdominal pain and tenderness.  Last episode of nausea, vomiting was 3 days ago.  Tolerating p.o.  OBJECTIVE:         Vital signs in last 24 hours:    Temp:  [97.8 F (36.6 C)-98.7 F (37.1 C)] 98.2 F (36.8 C) (09/02 0908) Pulse Rate:  [79-100] 92 (09/02 0908) Resp:  [14-85] 18 (09/02 0908) BP: (131-184)/(58-100) 156/92 (09/02 0908) SpO2:  [87 %-95 %] 95 % (09/02 0908) Weight:  [90.4 kg-92.9 kg] 90.4 kg (09/01 2121) Last BM Date: 06/24/19 Filed Weights   06/24/19 1539 06/24/19 2003 06/24/19 2121  Weight: 92.9 kg 90.4 kg 90.4 kg   General: Looks chronically ill. Heart: RRR. Chest: Clear bilaterally.  No labored breathing or cough. Abdomen: Soft.  Diffuse tenderness without guarding or rebound.  Active bowel sounds.  Nondistended. Extremities: No CCE. Neuro/Psych: Flat affect.  Seems depressed.  Moves all 4 limbs.  No tremor.  No gross deficit.  Intake/Output from previous day: 09/01 0701 - 09/02 0700 In: 120 [P.O.:120] Out: 2500   Intake/Output this shift: No intake/output data recorded.  Lab Results: Recent Labs    06/23/19 0608 06/23/19 1847 06/24/19 0638 06/25/19 0316  WBC 21.1*  --  11.8* 10.6*  HGB 6.8* 8.2* 7.9* 8.2*  HCT 20.9* 25.0* 24.8* 25.3*  PLT 291  --  286 290   BMET Recent Labs    06/23/19 0608 06/24/19 0638 06/25/19 0316  NA 133* 133* 134*  K 4.5 4.7 3.8  CL 96* 97* 94*  CO2 24 22 25   GLUCOSE 134* 96 92  BUN 52* 27* 15  CREATININE 8.82* 5.63* 4.07*  CALCIUM 8.9 8.4* 8.8*    Scheduled Meds: . aspirin EC  81 mg Oral Daily  . atorvastatin  20 mg Oral q1800  . [START ON 06/28/2019] darbepoetin (ARANESP) injection - DIALYSIS  150 mcg Intravenous Q Sat-HD  . ferric  citrate  210 mg Oral TID WC  . pantoprazole  40 mg Oral Q0600  . polyethylene glycol  17 g Oral Daily  . psyllium  1 packet Oral Daily  . sodium chloride flush  3 mL Intravenous Q12H  . Sucralfate Enema (sucralfate 2g/sterile water 82ml enema)  2 g Rectal BID   Continuous Infusions: . ceFEPime (MAXIPIME) IV 2 g (06/24/19 1933)  . vancomycin 1,000 mg (06/24/19 1116)   PRN Meds:.acetaminophen **OR** acetaminophen, albuterol, hydrALAZINE, morphine injection, nitroGLYCERIN, ondansetron **OR** ondansetron (ZOFRAN) IV, oxyCODONE   ASSESMENT:   *   Melena, bloody stool.   Abdominal pain.  Constipation.   EGD: 3 prominent cords in the mid and distal esophagus of unclear clinical significance. 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  Non-bleeding erosive gastropathy, bx'd.  Congested duodenal mucosa, bx'd.   Colonoscopy: Rectal ulcers a/w recent fecal impaction.  These are cause of hematochezia but do not think these are source of abd pain,  Miralax daily, psyllium daily, BID carafate enemas in place.    Abdominal pain continues.   *     Anemia.  Acute on chronic.  History anemia of chronic disease. Saved PRBCs x 2.  Aranesp weekly.  TID ferric citrate.   Hgb 6.2 >>  1 PRBC >> 7 >> 6.8.    s/p 3 PRBCs 8/30 - 8/31.    *      ESRD, on HD.  *     Aortic dissection.  06/03/2019 endovascular stent graft complicated by thoracic spine ischemia associated paraplegia.  *      History CVA.  On low dose ASA.    *    Elevated WBCs.  Suspect due to lumbar discitis.  Possible pneumonia and proctitis on [facility] CT.  Blood cultures negative.  On Vanco/cefepime for 6 weeks per ID.                   PLAN   *   Stopping psyllium as this can back fire to constipation.  If pt unable to take in adequate fluids, the stool can actually get harder rather than softer/bulkier.   Also may want to consider non-ferric binder since iron is constipating.     *   Awaiting gastric, duodenal, rectal bxs.    *   Further blood transfusion per hospitalist?    *   Per renal, carafate restricted to 2 weeks in renal pts due to risk of aluminum toxicity.      Azucena Freed  06/25/2019, 9:31 AM Phone [phone]

## 2019-06-26 DIAGNOSIS — A048 Other specified bacterial intestinal infections: Secondary | ICD-10-CM

## 2019-06-26 LAB — CBC
HCT: 22.6 % — ABNORMAL LOW (ref 39.0–52.0)
Hemoglobin: 7.2 g/dL — ABNORMAL LOW (ref 13.0–17.0)
MCH: 29.1 pg (ref 26.0–34.0)
MCHC: 31.9 g/dL (ref 30.0–36.0)
MCV: 91.5 fL (ref 80.0–100.0)
Platelets: 278 10*3/uL (ref 150–400)
RBC: 2.47 MIL/uL — ABNORMAL LOW (ref 4.22–5.81)
RDW: 16.1 % — ABNORMAL HIGH (ref 11.5–15.5)
WBC: 11.2 10*3/uL — ABNORMAL HIGH (ref 4.0–10.5)
nRBC: 0 % (ref 0.0–0.2)

## 2019-06-26 LAB — RENAL FUNCTION PANEL
Albumin: 1.9 g/dL — ABNORMAL LOW (ref 3.5–5.0)
Anion gap: 14 (ref 5–15)
BUN: 35 mg/dL — ABNORMAL HIGH (ref 6–20)
CO2: 25 mmol/L (ref 22–32)
Calcium: 8.5 mg/dL — ABNORMAL LOW (ref 8.9–10.3)
Chloride: 91 mmol/L — ABNORMAL LOW (ref 98–111)
Creatinine, Ser: 7.29 mg/dL — ABNORMAL HIGH (ref 0.61–1.24)
GFR calc Af Amer: 9 mL/min — ABNORMAL LOW (ref >60)
GFR calc non Af Amer: 7 mL/min — ABNORMAL LOW (ref >60)
Glucose, Bld: 103 mg/dL — ABNORMAL HIGH (ref 70–99)
Phosphorus: 4.9 mg/dL — ABNORMAL HIGH (ref 2.5–4.6)
Potassium: 4.2 mmol/L (ref 3.5–5.1)
Sodium: 130 mmol/L — ABNORMAL LOW (ref 135–145)

## 2019-06-26 LAB — HEMOGLOBIN AND HEMATOCRIT, BLOOD
HCT: 23.2 % — ABNORMAL LOW (ref 39.0–52.0)
Hemoglobin: 7.6 g/dL — ABNORMAL LOW (ref 13.0–17.0)

## 2019-06-26 LAB — VANCOMYCIN, RANDOM: Vancomycin Rm: 17

## 2019-06-26 MED ORDER — SODIUM CHLORIDE 0.9 % IV SOLN: 100.0000 mL | INTRAVENOUS | Status: DC | PRN

## 2019-06-26 MED ORDER — LABETALOL HCL 200 MG PO TABS: 200.0000 mg | ORAL_TABLET | Freq: Two times a day (BID) | ORAL | 0 refills | Status: DC

## 2019-06-26 MED ORDER — METRONIDAZOLE 250 MG PO TABS: 250.0000 mg | ORAL_TABLET | Freq: Four times a day (QID) | ORAL | Status: DC

## 2019-06-26 MED ORDER — PANTOPRAZOLE SODIUM 40 MG PO TBEC: 40.0000 mg | DELAYED_RELEASE_TABLET | Freq: Two times a day (BID) | ORAL | 0 refills | Status: DC

## 2019-06-26 MED ORDER — VANCOMYCIN HCL IN DEXTROSE 1-5 GM/200ML-% IV SOLN: 1000.0000 mg | INTRAVENOUS | Status: DC

## 2019-06-26 MED ORDER — BISMUTH SUBSALICYLATE 262 MG PO CHEW: 262.0000 mg | CHEWABLE_TABLET | Freq: Four times a day (QID) | ORAL | 0 refills | Status: DC

## 2019-06-26 MED ORDER — LANTHANUM CARBONATE 1000 MG PO CHEW: 1000.0000 mg | CHEWABLE_TABLET | Freq: Three times a day (TID) | ORAL | 0 refills | Status: DC

## 2019-06-26 MED ORDER — LABETALOL HCL 200 MG PO TABS
200.0000 mg | ORAL_TABLET | Freq: Two times a day (BID) | ORAL | Status: DC
  Administered 2019-06-26 – 2019-06-27 (×3): 200 mg via ORAL
  Filled 2019-06-26 (×4): qty 1

## 2019-06-26 MED ORDER — METRONIDAZOLE 500 MG PO TABS
250.0000 mg | ORAL_TABLET | Freq: Four times a day (QID) | ORAL | Status: DC
  Administered 2019-06-26 – 2019-07-02 (×26): 250 mg via ORAL
  Filled 2019-06-26 (×31): qty 1

## 2019-06-26 MED ORDER — HYDRALAZINE HCL 100 MG PO TABS: 100.0000 mg | ORAL_TABLET | Freq: Three times a day (TID) | ORAL | 0 refills | Status: DC

## 2019-06-26 MED ORDER — AMLODIPINE BESYLATE 10 MG PO TABS
10.0000 mg | ORAL_TABLET | Freq: Every day | ORAL | Status: DC
  Administered 2019-06-26 – 2019-07-02 (×7): 10 mg via ORAL
  Filled 2019-06-26 (×7): qty 1

## 2019-06-26 MED ORDER — POLYETHYLENE GLYCOL 3350 17 G PO PACK: 17.0000 g | PACK | Freq: Every day | ORAL | 0 refills | Status: DC

## 2019-06-26 MED ORDER — TETRACYCLINE HCL 250 MG PO CAPS: 500.0000 mg | ORAL_CAPSULE | Freq: Four times a day (QID) | ORAL | Status: DC

## 2019-06-26 MED ORDER — OXYCODONE HCL 5 MG PO TABS
ORAL_TABLET | ORAL | Status: AC
  Administered 2019-06-26: 5 mg via ORAL
  Filled 2019-06-26: qty 1

## 2019-06-26 MED ORDER — MORPHINE SULFATE (PF) 2 MG/ML IV SOLN
INTRAVENOUS | Status: AC
  Filled 2019-06-26: qty 2

## 2019-06-26 MED ORDER — DARBEPOETIN ALFA 150 MCG/0.3ML IJ SOSY
150.0000 ug | PREFILLED_SYRINGE | INTRAMUSCULAR | Status: DC
  Administered 2019-07-03: 150 ug via INTRAVENOUS
  Filled 2019-06-26: qty 0.3

## 2019-06-26 MED ORDER — ASPIRIN 81 MG PO TBEC: 81.0000 mg | DELAYED_RELEASE_TABLET | Freq: Every day | ORAL | 0 refills | Status: DC

## 2019-06-26 MED ORDER — NONFORMULARY OR COMPOUNDED ITEM: 2.0000 g | Freq: Two times a day (BID) | 0 refills | Status: DC

## 2019-06-26 MED ORDER — HEPARIN SODIUM (PORCINE) 1000 UNIT/ML DIALYSIS: 1000.0000 [IU] | INTRAMUSCULAR | Status: DC | PRN

## 2019-06-26 MED ORDER — AMLODIPINE BESYLATE 10 MG PO TABS: 10.0000 mg | ORAL_TABLET | Freq: Every day | ORAL | 0 refills | Status: DC

## 2019-06-26 MED ORDER — BISMUTH SUBSALICYLATE 262 MG PO CHEW
262.0000 mg | CHEWABLE_TABLET | Freq: Four times a day (QID) | ORAL | Status: DC
  Administered 2019-06-26 – 2019-07-02 (×22): 262 mg via ORAL
  Filled 2019-06-26 (×31): qty 1

## 2019-06-26 MED ORDER — MORPHINE SULFATE (PF) 4 MG/ML IV SOLN
INTRAVENOUS | Status: AC
  Filled 2019-06-26: qty 1

## 2019-06-26 MED ORDER — VANCOMYCIN HCL 10 G IV SOLR
1250.0000 mg | INTRAVENOUS | Status: AC
  Administered 2019-06-26: 1250 mg via INTRAVENOUS
  Filled 2019-06-26: qty 1250

## 2019-06-26 MED ORDER — PANTOPRAZOLE SODIUM 40 MG PO TBEC: 40.0000 mg | DELAYED_RELEASE_TABLET | Freq: Every day | ORAL | Status: DC

## 2019-06-26 MED ORDER — PENTAFLUOROPROP-TETRAFLUOROETH EX AERO: 1.0000 "application " | INHALATION_SPRAY | CUTANEOUS | Status: DC | PRN

## 2019-06-26 MED ORDER — TETRACYCLINE HCL 500 MG PO CAPS: 500.0000 mg | ORAL_CAPSULE | Freq: Four times a day (QID) | ORAL | 0 refills | Status: DC

## 2019-06-26 MED ORDER — PANTOPRAZOLE SODIUM 40 MG PO TBEC
40.0000 mg | DELAYED_RELEASE_TABLET | Freq: Two times a day (BID) | ORAL | Status: DC
  Administered 2019-06-27 – 2019-07-02 (×12): 40 mg via ORAL
  Filled 2019-06-26 (×13): qty 1

## 2019-06-26 MED ORDER — ALTEPLASE 2 MG IJ SOLR: 2.0000 mg | Freq: Once | INTRAMUSCULAR | Status: DC | PRN

## 2019-06-26 MED ORDER — HYDRALAZINE HCL 25 MG PO TABS
25.0000 mg | ORAL_TABLET | Freq: Three times a day (TID) | ORAL | Status: DC
  Administered 2019-06-26 – 2019-06-27 (×3): 25 mg via ORAL
  Filled 2019-06-26 (×3): qty 1

## 2019-06-26 MED ORDER — DARBEPOETIN ALFA 150 MCG/0.3ML IJ SOSY: 150.0000 ug | PREFILLED_SYRINGE | INTRAMUSCULAR | Status: DC

## 2019-06-26 MED ORDER — PANTOPRAZOLE SODIUM 40 MG PO TBEC: 40.0000 mg | DELAYED_RELEASE_TABLET | Freq: Two times a day (BID) | ORAL | Status: DC

## 2019-06-26 MED ORDER — BISMUTH SUBSALICYLATE 262 MG PO CHEW: 262.0000 mg | CHEWABLE_TABLET | Freq: Four times a day (QID) | ORAL | Status: DC

## 2019-06-26 MED ORDER — LIDOCAINE HCL (PF) 1 % IJ SOLN: 5.0000 mL | INTRAMUSCULAR | Status: DC | PRN

## 2019-06-26 MED ORDER — HEPARIN SODIUM (PORCINE) 1000 UNIT/ML IJ SOLN
INTRAMUSCULAR | Status: AC
  Filled 2019-06-26: qty 1

## 2019-06-26 MED ORDER — LANTHANUM CARBONATE 500 MG PO CHEW
1000.0000 mg | CHEWABLE_TABLET | Freq: Three times a day (TID) | ORAL | Status: DC
  Administered 2019-06-26 – 2019-07-02 (×15): 1000 mg via ORAL
  Filled 2019-06-26 (×18): qty 2

## 2019-06-26 MED ORDER — TETRACYCLINE HCL 250 MG PO CAPS
500.0000 mg | ORAL_CAPSULE | Freq: Four times a day (QID) | ORAL | Status: DC
  Administered 2019-06-26 – 2019-07-02 (×25): 500 mg via ORAL
  Filled 2019-06-26 (×31): qty 2

## 2019-06-26 MED ORDER — VANCOMYCIN HCL IN DEXTROSE 1-5 GM/200ML-% IV SOLN
1000.0000 mg | INTRAVENOUS | Status: DC
  Administered 2019-07-01 – 2019-07-03 (×2): 1000 mg via INTRAVENOUS
  Filled 2019-06-26 (×3): qty 200

## 2019-06-26 MED ORDER — DARBEPOETIN ALFA 200 MCG/0.4ML IJ SOSY
200.0000 ug | PREFILLED_SYRINGE | Freq: Once | INTRAMUSCULAR | Status: AC
  Administered 2019-06-26: 11:00:00 200 ug via INTRAVENOUS

## 2019-06-26 MED ORDER — ATORVASTATIN CALCIUM 20 MG PO TABS: 20.0000 mg | ORAL_TABLET | Freq: Every day | ORAL | 0 refills | Status: DC

## 2019-06-26 MED ORDER — METRONIDAZOLE 250 MG PO TABS: 250.0000 mg | ORAL_TABLET | Freq: Four times a day (QID) | ORAL | 0 refills | Status: DC

## 2019-06-26 MED ORDER — LIDOCAINE-PRILOCAINE 2.5-2.5 % EX CREA: 1.0000 "application " | TOPICAL_CREAM | CUTANEOUS | Status: DC | PRN

## 2019-06-26 MED ORDER — SODIUM CHLORIDE 0.9 % IV SOLN: 2.0000 g | INTRAVENOUS | Status: DC

## 2019-06-26 MED ORDER — DARBEPOETIN ALFA 200 MCG/0.4ML IJ SOSY
PREFILLED_SYRINGE | INTRAMUSCULAR | Status: AC
  Administered 2019-06-26: 200 ug via INTRAVENOUS
  Filled 2019-06-26: qty 0.4

## 2019-06-26 MED ORDER — OXYCODONE HCL 5 MG PO TABS: 5.0000 mg | ORAL_TABLET | ORAL | 0 refills | Status: AC | PRN

## 2019-06-26 NOTE — Progress Notes (Addendum)
Daily Rounding Note  06/26/2019, 12:53 PM  LOS: 13 days   SUBJECTIVE:   Chief complaint:  hematochezia   Informed pt is again passing blood PR.  Clots of deep red blood x 1 ~ noon.  Pt denies n/v, abd pain.  Pt says he had brown stool yesterday, staff recall dark stool yesterday.   BPs hypertensive: 199/92 at noon.    OBJECTIVE:         Vital signs in last 24 hours:    Temp:  [97.9 F (36.6 C)-100.4 F (38 C)] 98.6 F (37 C) (09/03 1204) Pulse Rate:  [77-106] 98 (09/03 1204) Resp:  [14-21] 18 (09/03 1204) BP: (159-211)/(89-112) 199/92 (09/03 1204) SpO2:  [94 %-100 %] 97 % (09/03 1204) Weight:  [87.4 kg-90.6 kg] 87.4 kg (09/03 1147) Last BM Date: 06/24/19 Filed Weights   06/25/19 2125 06/26/19 0700 06/26/19 1147  Weight: 90.6 kg 89.9 kg 87.4 kg   General: tired   Heart: RRR. Tele: NSR in 90s Chest: no dyspnea, no cough, lungs clear bil.   Abdomen: soft, NT, ND.  Hypoactive BS  Neuro/Psych:  Oriented x 3.    Intake/Output from previous day: 09/02 0701 - 09/03 0700 In: 600 [P.O.:600] Out: 0   Intake/Output this shift: Total I/O In: -  Out: 2722 [Other:2722]  Lab Results: Recent Labs    06/24/19 0638 06/25/19 0316 06/25/19 1620 06/26/19 0357  WBC 11.8* 10.6*  --  11.2*  HGB 7.9* 8.2* 7.6* 7.2*  HCT 24.8* 25.3* 23.8* 22.6*  PLT 286 290  --  278   BMET Recent Labs    06/24/19 0638 06/25/19 0316 06/26/19 0726  NA 133* 134* 130*  K 4.7 3.8 4.2  CL 97* 94* 91*  CO2 22 25 25   GLUCOSE 96 92 103*  BUN 27* 15 35*  CREATININE 5.63* 4.07* 7.29*  CALCIUM 8.4* 8.8* 8.5*   LFT Recent Labs    06/26/19 0726  ALBUMIN 1.9*   PT/INR No results for input(s): LABPROT, INR in the last 72 hours. Hepatitis Panel No results for input(s): HEPBSAG, HCVAB, HEPAIGM, HEPBIGM in the last 72 hours.  Studies/Results: No results found.   Surgical Pathology 1. Duodenum, Biopsy - DUODENAL MUCOSA WITH SLIGHT  BRUNNER GLAND HYPERPLASIA. - NO FEATURES OF SPRUE OR GRANULOMAS. 2. Stomach, biopsy, Antral - ANTRAL MUCOSA WITH EROSION AND MILD INFLAMMATION. [place] NEGATIVE FOR HELICOBACTER PYLORI. - NO INTESTINAL METAPLASIA, DYSPLASIA OR MALIGNANCY. 3. Stomach, biopsy - MILDLY ACTIVE CHRONIC HELICOBACTER PYLORI GASTRITIS. - WARTHIN-STARRY POSITIVE FOR HELICOBACTER PYLORI. - NO INTESTINAL METAPLASIA, DYSPLASIA OR MALIGNANCY. 4. Colon, biopsy, Sigmoid - ULCER WITH EXUDATE AND INFLAMED GRANULATION TISSUE. - DIFFERENTIAL INCLUDES DIVERTICULITIS-RELATED ULCERATION AND ISCHEMIA. - NO GRANULOMAS IDENTIFIED.  Scheduled Meds: . amLODipine  10 mg Oral QHS  . aspirin EC  81 mg Oral Daily  . atorvastatin  20 mg Oral q1800  . bismuth subsalicylate  99991111 mg Oral QID  . [START ON 06/28/2019] darbepoetin (ARANESP) injection - DIALYSIS  150 mcg Intravenous Q Sat-HD  . heparin      . hydrALAZINE  25 mg Oral Q8H  . labetalol  200 mg Oral BID  . lanthanum  1,000 mg Oral TID WC  . metroNIDAZOLE  250 mg Oral QID  . [START ON 06/27/2019] pantoprazole  40 mg Oral BID  . polyethylene glycol  17 g Oral Daily  . sodium chloride flush  3 mL Intravenous Q12H  . Sucralfate Enema (sucralfate 2g/sterile water  8ml enema)  2 g Rectal BID  . tetracycline  500 mg Oral QID   Continuous Infusions: . ceFEPime (MAXIPIME) IV 2 g (06/24/19 1933)  . [START ON 06/28/2019] vancomycin     PRN Meds:.acetaminophen **OR** acetaminophen, albuterol, hydrALAZINE, morphine injection, nitroGLYCERIN, ondansetron **OR** ondansetron (ZOFRAN) IV, oxyCODONE   ASSESMENT:   *   Melena, bloody stool.   Abdominal pain.  Constipation.   Recurrent bleeding, likely from rectal ulcers.   EGD: 3 prominent cords in the mid and distal esophagus of unclear clinical significance. 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 Non-bleeding erosive gastropathy, bx'd.  Congested duodenal mucosa, bx'd.   Colonoscopy: Rectal  ulcers a/w recent fecal impaction.    Miralax daily, BID carafate enemas in place. Flagyl, bismuth, tcn added to PPI today for tx of H pylori.      Abdominal pain continues.   *Anemia. Acute on chronic. History anemia of chronic disease. Saved PRBCsx 2.  Aranesp weekly.  TID ferric citrate.   Hgb 8.2 >> 7.2. s/p 3 PRBCs 8/30 - 8/31.    *ESRD, on HD.  *Aortic dissection. 06/03/2019 endovascular stent graft complicated by thoracic spine infarct and associated paraplegia.  *Hx CVA.  On low dose ASA.    *    Lumbar discitis.  On 6 weeks Vanc/cefepime.     PLAN   *   Flex sig tmrw. Tap water enema beforehand.  Npo to allow for sedation.      *  Continue to monitor Hgb/hct. 5 PM/5 AM      Azucena Freed  06/26/2019, 12:53 PM Phone [phone]

## 2019-06-26 NOTE — Progress Notes (Signed)
Pharmacy Antibiotic Note  Frank Fuller is a 58 y.o. male admitted on 06/13/2019 with lumbar discitis. Pharmacy has been consulted to dose vancomycin and cefepime.   Day #13 of vancomycin and cefepime. ID recommends to continue for 6 weeks total therapy ending 07/29/19.   9/3 Vancomycin pre-HD level = 17.  Estimated post-HD level ~ 9.5.    Plan: - Vancomycin 1250mg  IV x 1 dose following HD 9/3 - Restart vancomycin 1g IV qHD 9/5.   - Continue cefepime 2g IV qHD TTS - Monitor CBC, C/S, clinical progress, vancomycin levels as needed  Height: 6\' 4"  (193 cm) Weight: 198 lb 3.1 oz (89.9 kg) IBW/kg (Calculated) : 86.8  Temp (24hrs), Avg:99.7 F (37.6 C), Min:98.8 F (37.1 C), Max:100.4 F (38 C)  Recent Labs  Lab 06/21/19 0812  06/22/19 1101 06/23/19 0608 06/24/19 [id number] 06/25/19 0316 06/26/19 0357 06/26/19 0725 06/26/19 0726  WBC  --    < > 23.1* 21.1* 11.8* 10.6* 11.2*  --   --   CREATININE 6.99*  --   --  8.82* 5.63* 4.07*  --   --  7.29*  VANCORANDOM  --   --   --   --   --   --   --  17  --    < > = values in this interval not displayed.    Estimated Creatinine Clearance: 13.2 mL/min (A) (by C-G formula based on SCr of 7.29 mg/dL (H)).    Allergies  Allergen Reactions  . Oxycodone Nausea Only  . Chlorhexidine Other (See Comments)    Unknown reaction Patch skin test done at dialysis 06/26/17  - staff using clear dressing and alcohol to clean exit site of catheter  . Clonidine Derivatives Other (See Comments)    Dizziness   . Lisinopril Other (See Comments)    unresponsive  . Carvedilol Rash    Antimicrobials this admission: Vanc 8/21 >> 07/29/19 Cefepime 8/21 >> 07/29/19  Microbiology results: 8/23 BCx >> ngtd 8/25 IR disc aspirate >>NGTD 8/25 fluid culture sent.  Thank you for allowing pharmacy to be a part of this patient's care.  [employer], Pharm.D., BCPS Clinical Pharmacist Clinical phone for 06/26/2019 from 8:30-4:00 is [phone].  **Pharmacist phone  directory can now be found on [email or web address] ([email or web address]).  Listed under [place].  06/26/2019 9:53 AM
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
## 2019-06-26 NOTE — Progress Notes (Addendum)
  Results from EGD and colonoscopy biopsies:  1. Duodenum, Biopsy - DUODENAL MUCOSA WITH SLIGHT BRUNNER GLAND HYPERPLASIA. - NO FEATURES OF SPRUE OR GRANULOMAS. 2. Stomach, biopsy, Antral - ANTRAL MUCOSA WITH EROSION AND MILD INFLAMMATION. [place] NEGATIVE FOR HELICOBACTER PYLORI. - NO INTESTINAL METAPLASIA, DYSPLASIA OR MALIGNANCY. 3. Stomach, biopsy - MILDLY ACTIVE CHRONIC HELICOBACTER PYLORI GASTRITIS. - WARTHIN-STARRY POSITIVE FOR HELICOBACTER PYLORI. - NO INTESTINAL METAPLASIA, DYSPLASIA OR MALIGNANCY. 4. Colon, biopsy, Sigmoid - ULCER WITH EXUDATE AND INFLAMED GRANULATION TISSUE. - DIFFERENTIAL INCLUDES DIVERTICULITIS-RELATED ULCERATION AND ISCHEMIA. - NO GRANULOMAS IDENTIFIED.  Assessment/plan:   *   H Pylori positive gastritis.    Add pylera in generic forms for 14 days, up PPI to BID for duration of quadruple therapy.  Drop to Protonix 1 x daily after 14 days.

## 2019-06-26 NOTE — Progress Notes (Signed)
Inpatient Rehabilitation Admissions Coordinator  Dr. [place] has informed me that d/c to Cir cancelled today for further GI workup to be done tomorrow. I will cancel CIR admit for today.  Danne Baxter, RN, MSN Rehab Admissions Coordinator [phone] 06/26/2019 1:39 PM

## 2019-06-26 NOTE — Progress Notes (Signed)
0730 Pt in HD.    Paulla Fore, RN.

## 2019-06-26 NOTE — Progress Notes (Signed)
Pt in HD when PT attempted to see him, and will potentially be in CIR later today.  Reattempt at another time.   06/26/19 1100  PT Visit Information  Last PT Received On 06/26/19  Reason Eval/Treat Not Completed Patient at procedure or test/unavailable    Mee Hives, PT MS Acute Rehab Dept. Number: [place]

## 2019-06-26 NOTE — Progress Notes (Addendum)
[facility] Progress Note   Subjective: Seen on HD. No C/Os. Hypertensive on HD.   Objective Vitals:   06/25/19 1337 06/25/19 1704 06/25/19 2125 06/26/19 0516  BP: (!) 168/97 (!) 171/89 (!) 175/91 (!) 178/99  Pulse: 94 91 77 95  Resp: 14 18 16 16   Temp: (!) 100.4 F (38 C) 99.4 F (37.4 C) 98.8 F (37.1 C) 99.8 F (37.7 C)  TempSrc: Oral Oral Oral Oral  SpO2: 95% 96% 94% 98%  Weight:   90.6 kg   Height:       Physical Exam General: Pleasant older man in NAD Heart:RRR Lungs:CTAB, anteriorly.  Abdomen:soft, +tenderness, +BS Extremities: trace BLE edema Dialysis Access:R IJ TDCdrsg CDI blood lines connected   Additional Objective Labs: Basic Metabolic Panel: Recent Labs  Lab 06/21/19 0812  06/24/19 0638 06/25/19 0316 06/26/19 0726  NA 132*   < > 133* 134* 130*  K 3.4*   < > 4.7 3.8 4.2  CL 94*   < > 97* 94* 91*  CO2 25   < > 22 25 25   GLUCOSE 94   < > 96 92 103*  BUN 39*   < > 27* 15 35*  CREATININE 6.99*   < > 5.63* 4.07* 7.29*  CALCIUM 8.7*   < > 8.4* 8.8* 8.5*  PHOS 3.1  --   --   --  4.9*   < > = values in this interval not displayed.   Liver Function Tests: Recent Labs  Lab 06/21/19 0812 06/26/19 0726  ALBUMIN 1.9* 1.9*   No results for input(s): LIPASE, AMYLASE in the last 168 hours. CBC: Recent Labs  Lab 06/22/19 1101  06/23/19 0608  06/24/19 0638 06/25/19 0316 06/25/19 1620 06/26/19 0357  WBC 23.1*  --  21.1*  --  11.8* 10.6*  --  11.2*  NEUTROABS 18.5*  --  17.2*  --   --  7.9*  --   --   HGB 6.2*   < > 6.8*   < > 7.9* 8.2* 7.6* 7.2*  HCT 19.2*   < > 20.9*   < > 24.8* 25.3* 23.8* 22.6*  MCV 95.5  --  91.7  --  91.2 91.3  --  91.5  PLT 303  --  291  --  286 290  --  278   < > = values in this interval not displayed.   Blood Culture    Component Value Date/Time   SDES WOUND 06/17/2019 1512   SPECREQUEST L4 L5 DISC ASPIRATION 06/17/2019 1512   CULT  06/17/2019 1512    No growth aerobically or  anaerobically. Performed at [facility], [place] [address], [place], [place] [address]    REPTSTATUS 06/22/2019 FINAL 06/17/2019 1512    Cardiac Enzymes: No results for input(s): CKTOTAL, CKMB, CKMBINDEX, TROPONINI in the last 168 hours. CBG: No results for input(s): GLUCAP in the last 168 hours. Iron Studies: No results for input(s): IRON, TIBC, TRANSFERRIN, FERRITIN in the last 72 hours. @lablastinr3 @ Studies/Results: No results found. Medications: . sodium chloride    . sodium chloride    . ceFEPime (MAXIPIME) IV 2 g (06/24/19 1933)  . vancomycin 1,000 mg (06/24/19 1116)   . aspirin EC  81 mg Oral Daily  . atorvastatin  20 mg Oral q1800  . [START ON 06/28/2019] darbepoetin (ARANESP) injection - DIALYSIS  150 mcg Intravenous Q Sat-HD  . ferric citrate  210 mg Oral TID WC  . heparin      .  morphine      . pantoprazole  40 mg Oral Q0600  . polyethylene glycol  17 g Oral Daily  . sodium chloride flush  3 mL Intravenous Q12H  . Sucralfate Enema (sucralfate 2g/sterile water 71ml enema)  2 g Rectal BID     Dialysis Orders: [facility]  4h 66min 400/1.5 87.5kg 2/2.25 RIJ TDC  - No heparin -Parsabiv 2.5mg  IV q HD  - Calcitriol 1.76mcg PO q HD  Assessment/Plan: 1. L4-5 lumbar diskitis: s/p recent aortic stentgraft.BC neg from [place], repeat 8/23 w/NGTD.Needs IVVanc/Cefepime x6wks per ID 2. Spinal cord infarct: per spinal MRI 8/23, after complaints of not being able to move legs. Neurosurgery consulted, no plan for intervention, recommend daily ASA. CIR has evaluated, offered placement. He says he will go.   3. Hematocheziasuspected GIB- GI consulted.Went to endo 06/24/2019. Flex Sig- No bleeding but stigmata of recent bleeding found, diffusely ulcerated rectum. Upper endo with few non-bleeding erosions in gastric antrum. No active bleeding found. Recommended miralax, carafate however ESRD pts should not take Carafate longer than 2 weeks D/T potential for aluminum  toxicity. 4. ESRD -on HD TTS.K 4.5.HD today on schedule.K+ 4.2. NO heparin 5. Recent Type B aortic dissection (s/p stent graft repair 8/14) - goal SBP per VVS 120-170. Known severe resistant HTN. Trying to avoid BP drops on HD. On ASA 6. Anemia of CKD-Hgbdown to 6.8,s/p 2 unit pRBC overnight.HGB now 7.2. Give Aranesp today off schedule.  7. Secondary hyperparathyroidism -Ca/phos in goal. Continue calcitriol. Parsabiv not on hospital formulary.Resumed Auryxia at lower dose.Auryxia typically causes less constipation than other binders-frequently used for patient with intolerance of binders D/T constipation (Renvela/Calcium acetate). If GI concerned about use of ferric citrate, can change to fosrenol if patient agrees.  8. HTN/volume -See on HD. UFG 2.5 very hypertensive. Will add back amlodipine, start labetalol back at lower dose. Would like to see SBP at least in 150 range. Denies SOB.  9. Continue lowering volume as tolerating.  10. Nutrition -Renal diet w/fluid restrictions. Alb 1.9. protein supplements, vit 11. Recent MSSA bacteremia/AVG infection - Hx infected AVG s/p revision in 5/20, then partial resection in 6/20, then all AVG material (x2) removed 6/30. S/p 6 weeks Ancef completed.  Rita H. Brown NP-C 06/26/2019, 8:59 AM  [facility] [phone]  Pt seen, examined and agree w A/P as above. BP's rising again back up to 190/ 90 range.  Will resume some of his home BP meds.  [name]  MD 06/26/2019, 1:53 PM

## 2019-06-26 NOTE — Progress Notes (Signed)
PROGRESS NOTE    Frank Fuller  WGY:[id number] DOB: 1961/04/22 DOA: 06/13/2019 PCP: [facility]   Brief Narrative:   Frank Fuller is a 58 year old male with history of hypertension, ESRD on dialysis (TTS), abdominal aortic aneurysm status post stent, hepatitis C virus status post treatment, depression, CVA who initially presented to [facility] with multiple complaints.  He was recently hospitalized from 8/11-8/20 for dissection of thoracic aorta (type B) and was treated with endovascular stent graft with spinal drain by Dr. Oneida Alar on 8/14.  He was discharged home but immediately came back with complaints of urge to use the restroom but unable to do so.  He also had fever.  He was found to have paraplegia.  He was suspected to have thoracic spinal ischemia.  CT imaging also showed possible osteomyelitis/discitis of L4-L5.  Also suspected to have sepsis secondary to possible pneumonia. Underwent disc aspiration by IR on 06/18/2019.  Neurosurgery, vascular surgery, ID were following.   Hospital course remarkable for multiple  episodes of bloody bowel movement with clots. FOBT positive with acute blood loss anemia. Underwent  EGD and flexible sigmoidoscopy on 06/24/19 with findings of recal ulceration. Waiting for transfer to CIR/SNF.   Assessment & Plan:   Principal Problem:   Lumbar discitis Active Problems:   ESRD on hemodialysis (HCC)   Ileus (HCC)   Proctitis   H/O endovascular stent graft for abdominal aortic aneurysm   Gastric ulcer   Rectal ulceration   Suspected sepsis secondary to pneumonia vs discitis/osteomyelitis:  Presented with fever of 102F, leukocytosis.  ESR 138, CRP /procalcitonin was elevated.  CT also showed possible right-sided pneumonia with Iileus and proctitis.  MRI lumbar spine showed new space-occupying material in the lower retroperitoneum and lumbosacral junction. Favored acute discitis and osteomyelitis at L4-L5 with pervertebral phlegmon. Cultures  from [facility] did not show any growth.  Cultures sent here has not shown any growth.   --IR consulted and underwent disc aspiration 8/25; culture negative --ID consulted, recommended 6 weeks IV vancomycin/cefepime w/ HD; plan end date 07/29/2019 --Plan to check inflammatory markers q2 weeks per ID  GI bleed/abdominal pain:  Erosive gastropathy Rectal ulcers H. pylori gastritis Patient had several episodes of bloody bowel movement with clots and also complained of abdominal pain few days ago .  FOBT positive. CT angiogram did not show any active bleeding or any other acute abnormality.  Underwent  EGD and flexible sigmoidoscopy on 9/1 with finding of nonbleeding erosive gastropathy and multiple ulcers in the rectum due to fecal impaction but no bleeding.  --continue daily MiraLAX, Metamucil and Carafate enema for 2 weeks. --GI started Bismuth 274m PO QID, Flagyl 2554mPO QID, tetracycline 5007mID x 2 weeks --Protonix 58m54m BID x 2 weeks, then transition to once daily --GI plan repeat Flex sigmoidoscopy on 06/27/2019 with possible hemo-spray  Acute on chronic normocytic anemia:  Secondary to GI blood loss and ESRD.  Patient received 2 units of PRBCs on 06/23/2019. Hemoglobin this morning is 7.2, stable. --GI plans repeat flexible sigmoidoscopy on 06/27/2019 --Continue fosrenol 1000mg10mTID and ARANESP once weekly with HD  Hypoxia: On presentation he was  noted to be hypoxic with desaturation on room air.Pneumonia suspected.   CT had shown left pleural effusion, proBNP was elevated.  Continue volume control with dialysis. Last chest x-ray shows left base atelectasis.  Currently on room air  Chest pain/elevated troponin:  Elevated troponin but they are flat.  Most likely secondary to supply demand ischemia due  to ESRD. Denied any chest pain today.  EKG did not show any significant ST changes  ESRD on dialysis: ESRD on dialysis( Tuesday,Thursday,Saturday).  Nephrology following.     Thoracic aortic dissection:  Status post endovascular stent placement.  Vascular surgery was following  Hypertension:  --Continue amlodipine 10 mg p.o. daily, hydralazine 25 mg p.o. 3 times daily, labetalol 200 mg p.o. 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 Abnormal spinal cord at T9-T10, T10-11, possible segmental cord infarct.  Neurosurgery,neurology was consulted and they signed off.  No plan for intervention. Recommended daily baby aspirin by neurology.   Patient to follow-up with neurology [name] in 4 weeks at [facility]. His strength on bilateral lower extremities have improved. PT recommending CIR. Rehab coordinator/Social worker aware.     DVT prophylaxis: SCDs Code Status: Full code Family Communication: None Disposition Plan: Pending discharge to CIR hopefully tomorrow following flex sigmoidoscopy   Consultants:   Nephrology  Infectious disease - Dr. Megan Salon, signed off 8/28; f/u ID clinic outpatient  Vascular surgery - Dr. Oneida Alar  Neurosurgery - Dr. Arnoldo Morale; signed off 8/26  Neurology - Dr. Erlinda Hong; signed off 8/26; f/u with [name] at [facility] in 4 weeks  Procedures:   IR disc aspiration - 8/25  Flexible sigmoidoscopy 06/24/2019  EGD 06/24/2019  Antimicrobials:   Vancomycin/cefepime with HD with planned end date 07/29/2019.   Subjective: Patient seen and examined at bedside, following return from hemodialysis.  Reports bloody bowel movements this afternoon.  Discussed with GI, plan to repeat flexible sigmoidoscopy tomorrow with possible hemo-spray.  Abdominal pain improved, tolerating diet. No other complaints or concerns at this time.  Denies headache, no chest pain, no shortness of breath, no cough/congestion, no fever/chills/night sweats, no paresthesias.  No acute events overnight per nurse staff.   Objective: Vitals:   06/26/19 1000 06/26/19 1030 06/26/19 1147 06/26/19 1204  BP: (!) 189/105 (!) 159/106 (!) 163/98 (!) 199/92  Pulse: (!) 106 98 (!) 101 98  Resp:   (!) 21 18  Temp:   97.9 F (36.6 C) 98.6 F (37 C)  TempSrc:   Oral Oral  SpO2:   100% 97%  Weight:   87.4 kg   Height:        Intake/Output Summary (Last 24 hours) at 06/26/2019 1332 Last data filed at 06/26/2019 1128 Gross per 24 hour  Intake 300 ml  Output 2722 ml  Net -2422 ml   Filed Weights   06/25/19 2125 06/26/19 0700 06/26/19 1147  Weight: 90.6 kg 89.9 kg 87.4 kg    Examination:  General exam: Appears calm and comfortable  Respiratory system: Clear to auscultation. Respiratory effort normal. Cardiovascular system: S1 & S2 heard, RRR. No JVD, murmurs, rubs, gallops or clicks. No pedal edema. Gastrointestinal system: Abdomen is nondistended, soft, mild bilateral lower quadrant tenderness, No organomegaly or masses felt. Normal bowel sounds heard. Central nervous system: Alert and oriented. No focal neurological deficits. Extremities: Symmetric 5 x 5 power. Skin: No rashes, lesions or ulcers Psychiatry: Judgement and insight appear normal. Mood & affect appropriate.     Data Reviewed: I have personally reviewed following labs and imaging studies  CBC: Recent Labs  Lab 06/22/19 1101  06/23/19 0608 06/23/19 1847 06/24/19 0638 06/25/19 0316 06/25/19 1620 06/26/19 0357  WBC 23.1*  --  21.1*  --  11.8* 10.6*  --  11.2*  NEUTROABS 18.5*  --  17.2*  --   --  7.9*  --   --   HGB 6.2*   < > 6.8* 8.2* 7.9* 8.2* 7.6* 7.2*  HCT 19.2*   < > 20.9* 25.0* 24.8* 25.3* 23.8* 22.6*  MCV 95.5  --  91.7  --  91.2 91.3  --  91.5  PLT 303  --  291  --  286 290  --  278   < > = values in this interval not displayed.   Basic Metabolic Panel: Recent Labs  Lab 06/21/19 0812 06/23/19 0608 06/24/19 0638 06/25/19 0316 06/26/19 0726  NA 132* 133* 133* 134* 130*  K 3.4* 4.5 4.7 3.8 4.2  CL 94* 96* 97* 94* 91*   CO2 '25 24 22 25 25  ' GLUCOSE 94 134* 96 92 103*  BUN 39* 52* 27* 15 35*  CREATININE 6.99* 8.82* 5.63* 4.07* 7.29*  CALCIUM 8.7* 8.9 8.4* 8.8* 8.5*  PHOS 3.1  --   --   --  4.9*   GFR: Estimated Creatinine Clearance: 13.2 mL/min (A) (by C-G formula based on SCr of 7.29 mg/dL (H)). Liver Function Tests: Recent Labs  Lab 06/21/19 0812 06/26/19 0726  ALBUMIN 1.9* 1.9*   No results for input(s): LIPASE, AMYLASE in the last 168 hours. No results for input(s): AMMONIA in the last 168 hours. Coagulation Profile: No results for input(s): INR, PROTIME in the last 168 hours. Cardiac Enzymes: No results for input(s): CKTOTAL, CKMB, CKMBINDEX, TROPONINI in the last 168 hours. BNP (last 3 results) No results for input(s): PROBNP in the last 8760 hours. HbA1C: No results for input(s): HGBA1C in the last 72 hours. CBG: No results for input(s): GLUCAP in the last 168 hours. Lipid Profile: No results for input(s): CHOL, HDL, LDLCALC, TRIG, CHOLHDL, LDLDIRECT in the last 72 hours. Thyroid Function Tests: No results for input(s): TSH, T4TOTAL, FREET4, T3FREE, THYROIDAB in the last 72 hours. Anemia Panel: No results for input(s): VITAMINB12, FOLATE, FERRITIN, TIBC, IRON, RETICCTPCT in the last 72 hours. Sepsis Labs: No results for input(s): PROCALCITON, LATICACIDVEN in the last 168 hours.  Recent Results (from the past 240 hour(s))  Aerobic/Anaerobic Culture (surgical/deep wound)     Status: None   Collection Time: 06/17/19  3:12 PM   Specimen: Intervertebral Disc; Tissue  Result Value Ref Range Status   Specimen Description WOUND  Final   Special Requests L4 L5 DISC ASPIRATION  Final   Gram Stain NO WBC SEEN NO ORGANISMS SEEN   Final   Culture   Final    No growth aerobically or anaerobically. Performed at [facility], [place] [address], [place], [place] [address]    Report Status 06/22/2019 FINAL  Final         Radiology Studies: No results found.      Scheduled  Meds: . amLODipine  10 mg Oral QHS  . aspirin EC  81 mg Oral Daily  . atorvastatin  20 mg Oral q1800  . bismuth subsalicylate  373 mg Oral QID  . [START ON 06/28/2019] darbepoetin (ARANESP) injection - DIALYSIS  150 mcg Intravenous Q Sat-HD  . heparin      . hydrALAZINE  25 mg Oral Q8H  . labetalol  200 mg Oral BID  . lanthanum  1,000 mg Oral TID WC  . metroNIDAZOLE  250 mg Oral QID  . [START ON 06/27/2019] pantoprazole  40 mg Oral BID  . polyethylene glycol  17 g Oral Daily  . sodium chloride flush  3 mL  Intravenous Q12H  . Sucralfate Enema (sucralfate 2g/sterile water 30m enema)  2 g Rectal BID  . tetracycline  500 mg Oral QID   Continuous Infusions: . ceFEPime (MAXIPIME) IV 2 g (06/24/19 1933)  . [START ON 06/28/2019] vancomycin       LOS: 13 days    Time spent: 34 minutes spent on chart review, discussion with nursing staff, consultants, personally reviewing all imaging and labs, updating family and interview/physical exam; more than 50% of that time was spent in counseling and/or coordination of care.    Eric J [place] DO [facility] Pager [phone] If 7PM-7AM, please contact night-coverage [email or web address] Password [email or web address] 06/26/2019, 1:32 PM

## 2019-06-26 NOTE — Progress Notes (Signed)
Inpatient Rehabilitation Admissions Coordinator  Pt seen in hemodialysis. He is in agreement to admit to inpt rehab today. I contacted Dr. [place] and he will make arrangements to d/c to CIR today. Freda Munro in dialysis aware of the plans for CIR.  Danne Baxter, RN, MSN Rehab Admissions Coordinator [phone] 06/26/2019 11:41 AM
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
## 2019-06-26 NOTE — Progress Notes (Signed)
OT Cancellation Note  Patient Details Name: Frank Fuller MRN: [id number] DOB: 1961-06-03   Cancelled Treatment:    Reason Eval/Treat Not Completed: Patient declined, no reason specified("Absolutely not. I am in too much pain.")   Ebony Hail Harold Hedge) Marsa Aris OTR/L Acute Rehabilitation Services Pager: [phone] Office: [place] 06/26/2019, 4:11 PM

## 2019-06-26 NOTE — Progress Notes (Signed)
OT Cancellation Note  Patient Details Name: Frank Fuller MRN: [id number] DOB: 1961-08-04   Cancelled Treatment:    Reason Eval/Treat Not Completed: Patient at procedure or test/ unavailable(Pt at HD.)   Ebony Hail Harold Hedge) Marsa Aris OTR/L Acute Rehabilitation Services Pager: [phone] Office: [place] 06/26/2019, 1:26 PM

## 2019-06-27 ENCOUNTER — Inpatient Hospital Stay (HOSPITAL_COMMUNITY): Payer: Medicare Other | Admitting: Certified Registered Nurse Anesthetist

## 2019-06-27 ENCOUNTER — Encounter (HOSPITAL_COMMUNITY): Payer: Self-pay | Admitting: *Deleted

## 2019-06-27 ENCOUNTER — Encounter (HOSPITAL_COMMUNITY)
Admission: AD | Disposition: A | Discharge status: Rehabilitation Facility | Payer: Self-pay | Attending: Internal Medicine

## 2019-06-27 DIAGNOSIS — K625 Hemorrhage of anus and rectum: Secondary | ICD-10-CM

## 2019-06-27 HISTORY — PX: HEMOSTASIS CONTROL: SHX6838

## 2019-06-27 HISTORY — PX: FLEXIBLE SIGMOIDOSCOPY: SHX5431

## 2019-06-27 LAB — HEMOGLOBIN AND HEMATOCRIT, BLOOD
HCT: 19.5 % — ABNORMAL LOW (ref 39.0–52.0)
HCT: 20.8 % — ABNORMAL LOW (ref 39.0–52.0)
Hemoglobin: 6.2 g/dL — CL (ref 13.0–17.0)
Hemoglobin: 6.9 g/dL — CL (ref 13.0–17.0)

## 2019-06-27 LAB — POCT I-STAT, CHEM 8
BUN: 30 mg/dL — ABNORMAL HIGH (ref 6–20)
Calcium, Ion: 1.2 mmol/L (ref 1.15–1.40)
Chloride: 95 mmol/L — ABNORMAL LOW (ref 98–111)
Creatinine, Ser: 5.9 mg/dL — ABNORMAL HIGH (ref 0.61–1.24)
Glucose, Bld: 89 mg/dL (ref 70–99)
HCT: 18 % — ABNORMAL LOW (ref 39.0–52.0)
Hemoglobin: 6.1 g/dL — CL (ref 13.0–17.0)
Potassium: 4 mmol/L (ref 3.5–5.1)
Sodium: 132 mmol/L — ABNORMAL LOW (ref 135–145)
TCO2: 25 mmol/L (ref 22–32)

## 2019-06-27 LAB — PREPARE RBC (CROSSMATCH)

## 2019-06-27 SURGERY — SIGMOIDOSCOPY, FLEXIBLE
Anesthesia: Monitor Anesthesia Care

## 2019-06-27 MED ORDER — SODIUM CHLORIDE 0.9 % IV SOLN
INTRAVENOUS | Status: DC | PRN
  Administered 2019-06-27: 10:00:00 via INTRAVENOUS

## 2019-06-27 MED ORDER — LIDOCAINE HCL (CARDIAC) PF 100 MG/5ML IV SOSY
PREFILLED_SYRINGE | INTRAVENOUS | Status: DC | PRN
  Administered 2019-06-27: 40 mg via INTRAVENOUS

## 2019-06-27 MED ORDER — SODIUM CHLORIDE 0.9% IV SOLUTION
Freq: Once | INTRAVENOUS | Status: AC
  Administered 2019-06-27: via INTRAVENOUS

## 2019-06-27 MED ORDER — PROPOFOL 500 MG/50ML IV EMUL
INTRAVENOUS | Status: DC | PRN
  Administered 2019-06-27: 100 ug/kg/min via INTRAVENOUS

## 2019-06-27 MED ORDER — SODIUM CHLORIDE 0.9 % IV SOLN
INTRAVENOUS | Status: DC | PRN
  Administered 2019-07-01: 250 mL via INTRAVENOUS

## 2019-06-27 MED ORDER — PROPOFOL 10 MG/ML IV BOLUS
INTRAVENOUS | Status: DC | PRN
  Administered 2019-06-27 (×2): 20 mg via INTRAVENOUS

## 2019-06-27 MED FILL — Sucralfate Susp 1 GM/10ML: ORAL | Qty: 20 | Status: AC

## 2019-06-27 NOTE — Progress Notes (Signed)
PT Cancellation Note  Patient Details Name: Frank Fuller MRN: [id number] DOB: 02-21-1961   Cancelled Treatment:    Reason Eval/Treat Not Completed: Patient at procedure or test/unavailable.  Reattempt at another time.   Ramond Dial 06/27/2019, 3:15 PM   Mee Hives, PT MS Acute Rehab Dept. Number: [place] and [place]

## 2019-06-27 NOTE — Transfer of Care (Signed)
Immediate Anesthesia Transfer of Care Note  Patient: Frank Fuller  Procedure(s) Performed: FLEXIBLE SIGMOIDOSCOPY (N/A ) HEMOSTASIS CONTROL  Patient Location: PACU  Anesthesia Type:MAC  Level of Consciousness: awake, alert , oriented, drowsy and patient cooperative  Airway & Oxygen Therapy: Patient Spontanous Breathing and Patient connected to face mask oxygen  Post-op Assessment: Report given to RN and Post -op Vital signs reviewed and stable  Post vital signs: Reviewed and stable  Last Vitals:  Vitals Value Taken Time  BP 102/38 06/27/19 1034  Temp    Pulse 88 06/27/19 1034  Resp 10 06/27/19 1034  SpO2 97 % 06/27/19 1034  Vitals shown include unvalidated device data.  Last Pain:  Vitals:   06/27/19 0940  TempSrc: Temporal  PainSc:       Patients Stated Pain Goal: 0 (38/46/65 9935)  Complications: No apparent anesthesia complications

## 2019-06-27 NOTE — Progress Notes (Signed)
PROGRESS NOTE    Frank Fuller  LNL:[id number] DOB: July 23, 1961 DOA: 06/13/2019 PCP: [facility]   Brief Narrative:   Frank Fuller is a 58 year old male with history of hypertension, ESRD on dialysis (TTS), abdominal aortic aneurysm status post stent, hepatitis C virus status post treatment, depression, CVA who initially presented to [facility] with multiple complaints.  He was recently hospitalized from 8/11-8/20 for dissection of thoracic aorta (type B) and was treated with endovascular stent graft with spinal drain by Dr. Oneida Alar on 8/14.  He was discharged home but immediately came back with complaints of urge to use the restroom but unable to do so.  He also had fever.  He was found to have paraplegia.  He was suspected to have thoracic spinal ischemia.  CT imaging also showed possible osteomyelitis/discitis of L4-L5.  Also suspected to have sepsis secondary to possible pneumonia. Underwent disc aspiration by IR on 06/18/2019.  Neurosurgery, vascular surgery, ID were following.   Hospital course remarkable for multiple  episodes of bloody bowel movement with clots. FOBT positive with acute blood loss anemia. Underwent  EGD and flexible sigmoidoscopy on 06/24/19 with findings of recal ulceration. Waiting for transfer to CIR/SNF.   Assessment & Plan:   Principal Problem:   Lumbar discitis Active Problems:   ESRD on hemodialysis (HCC)   Ileus (HCC)   Proctitis   H/O endovascular stent graft for abdominal aortic aneurysm   Gastric ulcer   Rectal ulceration   H. pylori infection   Suspected sepsis secondary to pneumonia vs discitis/osteomyelitis:  Presented with fever of 102F, leukocytosis.  ESR 138, CRP /procalcitonin was elevated.  CT also showed possible right-sided pneumonia with Iileus and proctitis.  MRI lumbar spine showed new space-occupying material in the lower retroperitoneum and lumbosacral junction. Favored acute discitis and osteomyelitis at L4-L5 with  pervertebral phlegmon. Cultures from [facility] did not show any growth.  Cultures sent here has not shown any growth.   --IR consulted and underwent disc aspiration 8/25; culture negative --ID consulted, recommended 6 weeks IV vancomycin/cefepime w/ HD; plan end date 07/29/2019 --Plan to check inflammatory markers q2 weeks per ID  GI bleed/abdominal pain:  Erosive gastropathy Rectal ulcers H. pylori gastritis Patient had several episodes of bloody bowel movement with clots and also complained of abdominal pain few days ago .  FOBT positive. CT angiogram did not show any active bleeding or any other acute abnormality.  Underwent  EGD and flexible sigmoidoscopy on 9/1 with finding of nonbleeding erosive gastropathy and multiple ulcers in the rectum due to fecal impaction but no bleeding.  Repeat flex sig colonoscopy on 06/27/2019 with findings of continues area nonbleeding ulcerated mucosa in the distal rectum with friable mucosa and old blood, no active bleeding.  Hemostatic spray was deployed throughout the entire rectum. --continue daily MiraLAX, Metamucil and Carafate enema for 2 weeks. --High-fiber diet --GI started Bismuth 221m PO QID, Flagyl 2519mPO QID, tetracycline 50041mID x 2 weeks --Protonix 65m65m BID x 2 weeks, then transition to once daily --Consideration of colonoscopy for colon cancer screening in the future  Acute on chronic normocytic anemia:  Secondary to GI blood loss and ESRD.  Patient received 2 units of PRBCs on 06/23/2019. Hemoglobin this morning is 6.2. --Repeat flexible sigmoidoscopy today with no active bleeding appreciated --Continue fosrenol 1000mg52mTID and ARANESP once weekly with HD --Transfuse 2 units PRBCs today. --Repeat CBC in a.m.  Hypoxia: Resolved On presentation he was  noted to be hypoxic with  desaturation on room air. Pneumonia suspected.  CT had shown left pleural effusion, proBNP was elevated.  Continue volume control with dialysis. Last  chest x-ray shows left base atelectasis.  Currently on room air  Chest pain/elevated troponin:  Elevated troponin but they are flat.  Most likely secondary to supply demand ischemia due to ESRD. Denied any chest pain today.  EKG did not show any significant ST changes  ESRD on dialysis: ESRD on dialysis( Tuesday,Thursday,Saturday).  Nephrology following.    Thoracic aortic dissection:  Status post endovascular stent placement.  Vascular surgery was following  Hypertension:  --Continue amlodipine 10 mg p.o. daily, hydralazine 25 mg p.o. 3 times daily, labetalol 200 mg p.o. 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 Abnormal spinal cord at T9-T10, T10-11, possible segmental cord infarct.  Neurosurgery,neurology was consulted and they signed off.  No plan for intervention. Recommended daily baby aspirin by neurology.   Patient to follow-up with neurology [name] in 4 weeks at [facility]. His strength on bilateral lower extremities have improved. PT recommending CIR. Rehab coordinator/Social worker aware.    CIR to reevaluate on Monday for transfer if hemoglobin remained stable.   DVT prophylaxis: SCDs Code Status: Full code Family Communication: None Disposition Plan: Continue inpatient, blood transfusion, hopeful to CIR on Monday.   Consultants:   Nephrology  Infectious disease - Dr. Megan Salon, signed off 8/28; f/u ID clinic outpatient  Vascular surgery - Dr. Oneida Alar  Neurosurgery - Dr. Arnoldo Morale; signed off 8/26  Neurology - Dr. Erlinda Hong; signed off 8/26; f/u with [name] at [facility] in 4 weeks  Gastroenterology - Dr. Tarri Glenn  Procedures:   IR disc aspiration - 8/25  Flexible sigmoidoscopy 06/24/2019  EGD 06/24/2019  Flexible sigmoidoscopy 06/27/2019  Antimicrobials:   Vancomycin/cefepime with HD with planned end date  07/29/2019.   Subjective: Patient seen and examined at bedside following return from endoscopy.  Hemoglobin dropped to 6.2 overnight, flexible sigmoidoscopy shows no active bleeding but with areas of friable mucosa and ulceration status post hemostatic spray.  No bowel movements reported so far today.  No other complaints or concerns at this time.  Denies headache, no chest pain, no shortness of breath, no cough/congestion, no fever/chills/night sweats, no paresthesias.  No acute events overnight per nurse staff.  Objective: Vitals:   06/27/19 1053 06/27/19 1056 06/27/19 1229 06/27/19 1301  BP: (!) 143/87  (!) 146/71 131/76  Pulse: 91  90 91  Resp: '15  18 18  ' Temp:   98.8 F (37.1 C) 99 F (37.2 C)  TempSrc:   Oral Oral  SpO2:  94% 97% 97%  Weight:      Height:        Intake/Output Summary (Last 24 hours) at 06/27/2019 1510 Last data filed at 06/27/2019 1300 Gross per 24 hour  Intake 700 ml  Output --  Net 700 ml   Filed Weights   06/26/19 1147 06/26/19 2033 06/27/19 0932  Weight: 87.4 kg 87.5 kg 87.5 kg    Examination:  General exam: Appears calm and comfortable  Respiratory system: Clear to auscultation. Respiratory effort normal. Cardiovascular system: S1 & S2 heard, RRR. No JVD, murmurs, rubs, gallops or clicks. No pedal edema. Gastrointestinal system: Abdomen is nondistended, soft, mild bilateral lower quadrant tenderness, No organomegaly or masses felt. Normal bowel sounds heard. Central nervous system: Alert and oriented. No focal neurological deficits. Extremities: Symmetric 5 x 5  power. Skin: No rashes, lesions or ulcers Psychiatry: Judgement and insight appear normal. Mood & affect appropriate.     Data Reviewed: I have personally reviewed following labs and imaging studies  CBC: Recent Labs  Lab 06/22/19 1101  06/23/19 0608  06/24/19 2536 06/25/19 0316 06/25/19 1620 06/26/19 0357 06/26/19 1439 06/27/19 0829 06/27/19 0957  WBC 23.1*  --  21.1*  --   11.8* 10.6*  --  11.2*  --   --   --   NEUTROABS 18.5*  --  17.2*  --   --  7.9*  --   --   --   --   --   HGB 6.2*   < > 6.8*   < > 7.9* 8.2* 7.6* 7.2* 7.6* 6.2* 6.1*  HCT 19.2*   < > 20.9*   < > 24.8* 25.3* 23.8* 22.6* 23.2* 19.5* 18.0*  MCV 95.5  --  91.7  --  91.2 91.3  --  91.5  --   --   --   PLT 303  --  291  --  286 290  --  278  --   --   --    < > = values in this interval not displayed.   Basic Metabolic Panel: Recent Labs  Lab 06/21/19 0812 06/23/19 0608 06/24/19 0638 06/25/19 0316 06/26/19 0726 06/27/19 0957  NA 132* 133* 133* 134* 130* 132*  K 3.4* 4.5 4.7 3.8 4.2 4.0  CL 94* 96* 97* 94* 91* 95*  CO2 '25 24 22 25 25  ' --   GLUCOSE 94 134* 96 92 103* 89  BUN 39* 52* 27* 15 35* 30*  CREATININE 6.99* 8.82* 5.63* 4.07* 7.29* 5.90*  CALCIUM 8.7* 8.9 8.4* 8.8* 8.5*  --   PHOS 3.1  --   --   --  4.9*  --    GFR: Estimated Creatinine Clearance: 16.3 mL/min (A) (by C-G formula based on SCr of 5.9 mg/dL (H)). Liver Function Tests: Recent Labs  Lab 06/21/19 0812 06/26/19 0726  ALBUMIN 1.9* 1.9*   No results for input(s): LIPASE, AMYLASE in the last 168 hours. No results for input(s): AMMONIA in the last 168 hours. Coagulation Profile: No results for input(s): INR, PROTIME in the last 168 hours. Cardiac Enzymes: No results for input(s): CKTOTAL, CKMB, CKMBINDEX, TROPONINI in the last 168 hours. BNP (last 3 results) No results for input(s): PROBNP in the last 8760 hours. HbA1C: No results for input(s): HGBA1C in the last 72 hours. CBG: No results for input(s): GLUCAP in the last 168 hours. Lipid Profile: No results for input(s): CHOL, HDL, LDLCALC, TRIG, CHOLHDL, LDLDIRECT in the last 72 hours. Thyroid Function Tests: No results for input(s): TSH, T4TOTAL, FREET4, T3FREE, THYROIDAB in the last 72 hours. Anemia Panel: No results for input(s): VITAMINB12, FOLATE, FERRITIN, TIBC, IRON, RETICCTPCT in the last 72 hours. Sepsis Labs: No results for input(s):  PROCALCITON, LATICACIDVEN in the last 168 hours.  Recent Results (from the past 240 hour(s))  Aerobic/Anaerobic Culture (surgical/deep wound)     Status: None   Collection Time: 06/17/19  3:12 PM   Specimen: Intervertebral Disc; Tissue  Result Value Ref Range Status   Specimen Description WOUND  Final   Special Requests L4 L5 DISC ASPIRATION  Final   Gram Stain NO WBC SEEN NO ORGANISMS SEEN   Final   Culture   Final    No growth aerobically or anaerobically. Performed at [facility], [place] [address] [place], [place] [address]  Report Status 06/22/2019 FINAL  Final         Radiology Studies: No results found.      Scheduled Meds:  sodium chloride   Intravenous Once   amLODipine  10 mg Oral QHS   aspirin EC  81 mg Oral Daily   atorvastatin  20 mg Oral O0600   bismuth subsalicylate  459 mg Oral QID   [START ON 07/03/2019] darbepoetin (ARANESP) injection - DIALYSIS  150 mcg Intravenous Q Thu-HD   hydrALAZINE  25 mg Oral Q8H   labetalol  200 mg Oral BID   lanthanum  1,000 mg Oral TID WC   metroNIDAZOLE  250 mg Oral QID   pantoprazole  40 mg Oral BID   polyethylene glycol  17 g Oral Daily   sodium chloride flush  3 mL Intravenous Q12H   Sucralfate Enema (sucralfate 2g/sterile water 23m enema)  2 g Rectal BID   tetracycline  500 mg Oral QID   Continuous Infusions:  ceFEPime (MAXIPIME) IV 2 g (06/26/19 1811)   [START ON 06/28/2019] vancomycin       LOS: 14 days    Time spent: 34 minutes spent on chart review, discussion with nursing staff, consultants, personally reviewing all imaging and labs, updating family and interview/physical exam; more than 50% of that time was spent in counseling and/or coordination of care.    Harwood Nall J [place] DO [facility] Pager [phone] If 7PM-7AM, please contact night-coverage [email or web address] Password [email or web address] 06/27/2019, 3:10 PM

## 2019-06-27 NOTE — Op Note (Signed)
[facility] Patient Name: Frank Fuller Procedure Date : 06/27/2019 MRN: [id number] Attending MD: Thornton Park MD, MD Date of Birth: 10-30-1960 CSN: [id number] Age: 58 Admit Type: Outpatient Procedure:                Flexible Sigmoidoscopy Indications:              Rectal hemorrhage from stercocal ulcer with recent                            fecal impaction with ongoing rectal bleeding Providers:                Thornton Park MD, MD, Carlyn Reichert, RN, Marguerita Merles, Technician, Lina Sar, Technician Referring MD:              Medicines:                See the Anesthesia note for documentation of the                            administered medications Complications:            No immediate complications. Estimated Blood Loss:     Estimated blood loss: none. Procedure:                Pre-Anesthesia Assessment:                           - Prior to the procedure, a History and Physical                            was performed, and patient medications and                            allergies were reviewed. The patient's tolerance of                            previous anesthesia was also reviewed. The risks                            and benefits of the procedure and the sedation                            options and risks were discussed with the patient.                            All questions were answered, and informed consent                            was obtained. Prior Anticoagulants: The patient has                            taken no previous anticoagulant or antiplatelet  agents. ASA Grade Assessment: III - A patient with                            severe systemic disease. After reviewing the risks                            and benefits, the patient was deemed in                            satisfactory condition to undergo the procedure.                           After obtaining informed consent, the  scope was                            passed under direct vision. The [id number]                            Olympus colonoscope was introduced through the anus                            and advanced to the the sigmoid colon. The flexible                            sigmoidoscopy was accomplished without difficulty.                            The patient tolerated the procedure well. The                            quality of the bowel preparation was adequate. Scope In: 10:16:48 AM Scope Out: 10:25:48 AM Total Procedure Duration: 0 hours 9 minutes 0 seconds  Findings:      A continuous area of nonbleeding ulcerated mucosa with stigmata of       recent bleeding was present in the distal rectum. This involved       approximately 4cm of the rectum and was near circumerfential. In many       places the ulcers were deeply cratered. The mucosa is very friable. Old       blood was present. For hemostasis, hemostatic spray was deployed.       Several sprays were applied. The entire rectum was treated. There was no       bleeding at the end of the procedure.      The exam was otherwise without abnormality. The mucosa proximal to the       rectum appeared normal. Impression:               - No specimens collected. Recommendation:           - Drink plenty of fluids. Follow a high fiber diet.                           - Use daily Metamucil or Benefiber in combination  with Miralax to avoid constipation.                           - Continue Carafate enemas 2g/6mL PR BID x 10 more                            days.                           - May follow-up with GI on a PRN basis                           - Should consider colonoscopy for colon cancer                            screening in the future. Procedure Code(s):        --- Professional ---                           [phone], Sigmoidoscopy, flexible; with control of                            bleeding, any  method Diagnosis Code(s):        --- Professional ---                           K62.5, Hemorrhage of anus and rectum CPT copyright 2019 American Medical Association. All rights reserved. The codes documented in this report are preliminary and upon coder review may  be revised to meet current compliance requirements. Thornton Park MD, MD 06/27/2019 10:44:26 AM This report has been signed electronically. Number of Addenda: 0

## 2019-06-27 NOTE — Progress Notes (Addendum)
CRITICAL VALUE STICKER  CRITICAL VALUE: Hgb=6.2  RECEIVER (on-site recipient of call):Frank Fuller, [place] NOTIFIED: 06/27/19 [id number]  MESSENGER (representative from lab):  MD NOTIFIED: [place] Eric MD   [place]: 06/27/19 0939  RESPONSE: MD stated he would order 2 units of PRBCs.

## 2019-06-27 NOTE — Progress Notes (Signed)
Inpatient Rehabilitation Admissions Coordinator  Noted medical work up ongoing. To be transfused today. I will follow up on Monday.  Danne Baxter, RN, MSN Rehab Admissions Coordinator [phone] 06/27/2019 10:34 AM

## 2019-06-27 NOTE — Progress Notes (Signed)
Pt arrived back to the unit from Endo. Pt alert and oriented x4 at baseline. Pt is sitting up in bed and ordering lunch. No distress noted.   Paulla Fore, RN

## 2019-06-27 NOTE — Progress Notes (Signed)
Pt took out retention enema, stated he could not "take it any longer". Pt reports pain in the rectum. Pt was given morphine per PRN orders for pain.  Paulla Fore, RN

## 2019-06-27 NOTE — Progress Notes (Addendum)
[facility] Progress Note   Subjective: No C/Os. Went for flexible sigmoidoscopy today with hemostatic spray to entire rectum for ulcerated mucosa. HGB down to 6.1. Getting 1 unit PRBCs.     Objective Vitals:   06/27/19 1053 06/27/19 1056 06/27/19 1229 06/27/19 1301  BP: (!) 143/87  (!) 146/71 131/76  Pulse: 91  90 91  Resp: 15  18 18   Temp:   98.8 F (37.1 C) 99 F (37.2 C)  TempSrc:   Oral Oral  SpO2:  94% 97% 97%  Weight:      Height:       Physical Exam General:Pleasant older man in NAD Heart:RRR Lungs:CTAB, anteriorly.  Abdomen:soft, +tenderness, +BS Extremities:trace BLE edema Neuro: Moving LE extremities to command.  Dialysis Access:R IJ TDCdrsg CDI.    Additional Objective Labs: Basic Metabolic Panel: Recent Labs  Lab 06/21/19 0812  06/24/19 [id number] 06/25/19 0316 06/26/19 0726 06/27/19 0957  NA 132*   < > 133* 134* 130* 132*  K 3.4*   < > 4.7 3.8 4.2 4.0  CL 94*   < > 97* 94* 91* 95*  CO2 25   < > 22 25 25   --   GLUCOSE 94   < > 96 92 103* 89  BUN 39*   < > 27* 15 35* 30*  CREATININE 6.99*   < > 5.63* 4.07* 7.29* 5.90*  CALCIUM 8.7*   < > 8.4* 8.8* 8.5*  --   PHOS 3.1  --   --   --  4.9*  --    < > = values in this interval not displayed.   Liver Function Tests: Recent Labs  Lab 06/21/19 0812 06/26/19 0726  ALBUMIN 1.9* 1.9*   No results for input(s): LIPASE, AMYLASE in the last 168 hours. CBC: Recent Labs  Lab 06/22/19 1101  06/23/19 0608  06/24/19 [id number] 06/25/19 0316  06/26/19 0357 06/26/19 1439 06/27/19 0829 06/27/19 0957  WBC 23.1*  --  21.1*  --  11.8* 10.6*  --  11.2*  --   --   --   NEUTROABS 18.5*  --  17.2*  --   --  7.9*  --   --   --   --   --   HGB 6.2*   < > 6.8*   < > 7.9* 8.2*   < > 7.2* 7.6* 6.2* 6.1*  HCT 19.2*   < > 20.9*   < > 24.8* 25.3*   < > 22.6* 23.2* 19.5* 18.0*  MCV 95.5  --  91.7  --  91.2 91.3  --  91.5  --   --   --   PLT 303  --  291  --  286 290  --  278  --   --   --    < > = values in  this interval not displayed.   Blood Culture    Component Value Date/Time   SDES WOUND 06/17/2019 1512   SPECREQUEST L4 L5 DISC ASPIRATION 06/17/2019 1512   CULT  06/17/2019 1512    No growth aerobically or anaerobically. Performed at [facility], [place] [address], [place], [place] [address]    REPTSTATUS 06/22/2019 FINAL 06/17/2019 1512    Cardiac Enzymes: No results for input(s): CKTOTAL, CKMB, CKMBINDEX, TROPONINI in the last 168 hours. CBG: No results for input(s): GLUCAP in the last 168 hours. Iron Studies: No results for input(s): IRON, TIBC, TRANSFERRIN, FERRITIN in the last 72 hours. @lablastinr3 @ Studies/Results: No results  found. Medications: . ceFEPime (MAXIPIME) IV 2 g (06/26/19 1811)  . [START ON 06/28/2019] vancomycin     . sodium chloride   Intravenous Once  . amLODipine  10 mg Oral QHS  . aspirin EC  81 mg Oral Daily  . atorvastatin  20 mg Oral q1800  . bismuth subsalicylate  99991111 mg Oral QID  . [START ON 07/03/2019] darbepoetin (ARANESP) injection - DIALYSIS  150 mcg Intravenous Q Thu-HD  . hydrALAZINE  25 mg Oral Q8H  . labetalol  200 mg Oral BID  . lanthanum  1,000 mg Oral TID WC  . metroNIDAZOLE  250 mg Oral QID  . pantoprazole  40 mg Oral BID  . polyethylene glycol  17 g Oral Daily  . sodium chloride flush  3 mL Intravenous Q12H  . Sucralfate Enema (sucralfate 2g/sterile water 55ml enema)  2 g Rectal BID  . tetracycline  500 mg Oral QID     Dialysis Orders: [facility]  4h 45min 400/1.5 87.5kg 2/2.25 UFP 2 RIJ TDC  -No heparin -Parsabiv 2.5mg  IV q HD  - Calcitriol 1.16mcg PO q HD  Assessment/Plan: 1. L4-5 lumbar diskitis: s/p recent aortic stentgraft.BC neg from [place], repeat 8/23 w/NGTD.Needs IVVanc/Cefepime x6wks per ID 2. Spinal cord infarct: per spinal MRI 8/23, after complaints of not being able to move legs. Neurosurgery consulted, no plan for intervention, recommend daily ASA. CIR has evaluated, offered placement. He says he  will go.   3. Hematocheziasuspected GIB- GI consulted.Went to endo09/10/2018. Flex Sig- No bleeding but stigmata of recent bleeding found, diffusely ulcerated rectum. Upper endo with few non-bleeding erosions in gastric antrum. No active bleeding found. Recommended miralax, carafate however ESRD pts should not take Carafate longer than 2 weeks D/T potential for aluminum toxicity.Went for flexible sigmoidoscopy today with hemostatic spray to entire rectum. HGB down 6.1. Getting 1 unit PRBCs.  4. ESRD -on HD TTS.K 4.5.HD tomorrow on schedule. K+ 4.0. NO heparin 5. RecentType B aortic dissection (s/p stent graft repair 8/14) - goal SBP per VVS 120-170. Known severe resistant HTN. Trying to avoid BP drops on HD. On ASA 6. Anemia of CKD-Hgbdown to 6.8,s/p 2 unit pRBC 06/23/19, rec'd 1 unit PRBCs 06/22/19.HGB now 6.1-getting 1 unit PRBCs now. Rec'd Aranesp 200 mcg IV 06/26/19. Follow HGB. Transfuse PRN.  7. Secondary hyperparathyroidism -Ca/phos in goal. Continue calcitriol. Parsabiv not on hospital formulary.Resumed Auryxia at lower dose.Auryxia typically causes less constipation than other binders-frequently used for patient with intolerance of binders D/T constipation (Renvela/Calcium acetate). If GI concerned about use of ferric citrate. Changed to Fosrenol.  8. HTN/volume -HD 09/03 Pre wt 89.9 kg Net UF 2722 Post wt 87.4. Now at OP EDW. Added back amlodipine, start labetalol back at lower dose. BPmuch better controlled. Keep in SBP 140-150 range.  9. Nutrition -Renal diet w/fluid restrictions. Alb 1.9. protein supplements, vit 10. Recent MSSA bacteremia/AVG infection - Hx infected AVG s/p revision in 5/20, then partial resection in 6/20, then all AVG material (x2) removed 6/30. S/p 6 weeks Ancef completed.   Rita H. Brown NP-C 06/27/2019, 1:06 PM  [facility] [phone]  Pt seen, examined and agree w A/P as above.  [name]  MD 06/27/2019, 1:29  PM

## 2019-06-27 NOTE — Anesthesia Postprocedure Evaluation (Signed)
Anesthesia Post Note  Patient: Frank Fuller  Procedure(s) Performed: FLEXIBLE SIGMOIDOSCOPY (N/A ) HEMOSTASIS CONTROL     Patient location during evaluation: Endoscopy Anesthesia Type: MAC Level of consciousness: awake and alert Pain management: pain level controlled Vital Signs Assessment: post-procedure vital signs reviewed and stable Respiratory status: spontaneous breathing, nonlabored ventilation, respiratory function stable and patient connected to nasal cannula oxygen Cardiovascular status: stable and blood pressure returned to baseline Postop Assessment: no apparent nausea or vomiting Anesthetic complications: no    Last Vitals:  Vitals:   06/27/19 1229 06/27/19 1301  BP: (!) 146/71 131/76  Pulse: 90 91  Resp: 18 18  Temp: 37.1 C 37.2 C  SpO2: 97% 97%    Last Pain:  Vitals:   06/27/19 1550  TempSrc:   PainSc: 6                  Eschol Auxier COKER

## 2019-06-27 NOTE — Anesthesia Preprocedure Evaluation (Signed)
Anesthesia Evaluation  Patient identified by MRN, date of birth, ID band Patient awake    Reviewed: Allergy & Precautions, NPO status , Patient's Chart, lab work & pertinent test results  Airway Mallampati: II  TM Distance: >3 FB     Dental  (+) Teeth Intact   Pulmonary Current Smoker,    breath sounds clear to auscultation       Cardiovascular hypertension,  Rhythm:Regular Rate:Normal     Neuro/Psych    GI/Hepatic   Endo/Other    Renal/GU      Musculoskeletal   Abdominal   Peds  Hematology   Anesthesia Other Findings   Reproductive/Obstetrics                             Anesthesia Physical Anesthesia Plan  ASA: III  Anesthesia Plan: MAC   Post-op Pain Management:    Induction:   PONV Risk Score and Plan:   Airway Management Planned: Simple Face Mask and Natural Airway  Additional Equipment:   Intra-op Plan:   Post-operative Plan:   Informed Consent: I have reviewed the patients History and Physical, chart, labs and discussed the procedure including the risks, benefits and alternatives for the proposed anesthesia with the patient or authorized representative who has indicated his/her understanding and acceptance.       Plan Discussed with: CRNA and Anesthesiologist  Anesthesia Plan Comments:         Anesthesia Quick Evaluation

## 2019-06-28 LAB — BASIC METABOLIC PANEL
Anion gap: 16 — ABNORMAL HIGH (ref 5–15)
BUN: 46 mg/dL — ABNORMAL HIGH (ref 6–20)
CO2: 22 mmol/L (ref 22–32)
Calcium: 8.6 mg/dL — ABNORMAL LOW (ref 8.9–10.3)
Chloride: 94 mmol/L — ABNORMAL LOW (ref 98–111)
Creatinine, Ser: 7.98 mg/dL — ABNORMAL HIGH (ref 0.61–1.24)
GFR calc Af Amer: 8 mL/min — ABNORMAL LOW (ref >60)
GFR calc non Af Amer: 7 mL/min — ABNORMAL LOW (ref >60)
Glucose, Bld: 93 mg/dL (ref 70–99)
Potassium: 4.5 mmol/L (ref 3.5–5.1)
Sodium: 132 mmol/L — ABNORMAL LOW (ref 135–145)

## 2019-06-28 LAB — CBC
HCT: 25.3 % — ABNORMAL LOW (ref 39.0–52.0)
Hemoglobin: 8.2 g/dL — ABNORMAL LOW (ref 13.0–17.0)
MCH: 29 pg (ref 26.0–34.0)
MCHC: 32.4 g/dL (ref 30.0–36.0)
MCV: 89.4 fL (ref 80.0–100.0)
Platelets: 258 10*3/uL (ref 150–400)
RBC: 2.83 MIL/uL — ABNORMAL LOW (ref 4.22–5.81)
RDW: 16 % — ABNORMAL HIGH (ref 11.5–15.5)
WBC: 7.7 10*3/uL (ref 4.0–10.5)
nRBC: 0 % (ref 0.0–0.2)

## 2019-06-28 LAB — HEMOGLOBIN AND HEMATOCRIT, BLOOD
HCT: 22.5 % — ABNORMAL LOW (ref 39.0–52.0)
Hemoglobin: 7.4 g/dL — ABNORMAL LOW (ref 13.0–17.0)

## 2019-06-28 MED ORDER — LABETALOL HCL 300 MG PO TABS
300.0000 mg | ORAL_TABLET | Freq: Two times a day (BID) | ORAL | Status: DC
  Administered 2019-06-28 – 2019-07-02 (×9): 300 mg via ORAL
  Filled 2019-06-28 (×10): qty 1

## 2019-06-28 MED ORDER — HYDRALAZINE HCL 50 MG PO TABS: 50.0000 mg | ORAL_TABLET | Freq: Three times a day (TID) | ORAL | Status: DC

## 2019-06-28 MED ORDER — HYDRALAZINE HCL 50 MG PO TABS
100.0000 mg | ORAL_TABLET | Freq: Three times a day (TID) | ORAL | Status: DC
  Administered 2019-06-28 – 2019-07-02 (×14): 100 mg via ORAL
  Filled 2019-06-28 (×15): qty 2

## 2019-06-28 MED ORDER — HEPARIN SODIUM (PORCINE) 1000 UNIT/ML IJ SOLN
INTRAMUSCULAR | Status: AC
  Administered 2019-06-28: 14:00:00
  Filled 2019-06-28: qty 4

## 2019-06-28 NOTE — Progress Notes (Signed)
PROGRESS NOTE    Frank Fuller  TDD:[id number] DOB: Sep 05, 1961 DOA: 06/13/2019 PCP: [facility]   Brief Narrative:   Frank Fuller is a 58 year old male with history of hypertension, ESRD on dialysis (TTS), abdominal aortic aneurysm status post stent, hepatitis C virus status post treatment, depression, CVA who initially presented to [facility] with multiple complaints.  He was recently hospitalized from 8/11-8/20 for dissection of thoracic aorta (type B) and was treated with endovascular stent graft with spinal drain by Dr. Oneida Alar on 8/14.  He was discharged home but immediately came back with complaints of urge to use the restroom but unable to do so.  He also had fever.  He was found to have paraplegia.  He was suspected to have thoracic spinal ischemia.  CT imaging also showed possible osteomyelitis/discitis of L4-L5.  Also suspected to have sepsis secondary to possible pneumonia. Underwent disc aspiration by IR on 06/18/2019.  Neurosurgery, vascular surgery, ID were following.   Hospital course remarkable for multiple  episodes of bloody bowel movement with clots. FOBT positive with acute blood loss anemia. Underwent  EGD and flexible sigmoidoscopy on 06/24/19 with findings of recal ulceration. Waiting for transfer to CIR/SNF.   Assessment & Plan:   Principal Problem:   Lumbar discitis Active Problems:   ESRD on hemodialysis (HCC)   Ileus (HCC)   Proctitis   H/O endovascular stent graft for abdominal aortic aneurysm   Gastric ulcer   Rectal ulceration   H. pylori infection   Suspected sepsis secondary to pneumonia vs discitis/osteomyelitis:  Presented with fever of 102F, leukocytosis.  ESR 138, CRP /procalcitonin was elevated.  CT also showed possible right-sided pneumonia with Iileus and proctitis.  MRI lumbar spine showed new space-occupying material in the lower retroperitoneum and lumbosacral junction. Favored acute discitis and osteomyelitis at L4-L5 with  pervertebral phlegmon. Cultures from [facility] did not show any growth.  Cultures sent here has not shown any growth.   --IR consulted and underwent disc aspiration 8/25; culture negative --ID consulted, recommended 6 weeks IV vancomycin/cefepime w/ HD; plan end date 07/29/2019 --Plan to check inflammatory markers q2 weeks per ID (Next: 06/30/2019)  GI bleed/abdominal pain:  Erosive gastropathy Rectal ulcers H. pylori gastritis Patient had several episodes of bloody bowel movement with clots and also complained of abdominal pain few days ago .  FOBT positive. CT angiogram did not show any active bleeding or any other acute abnormality.  Underwent  EGD and flexible sigmoidoscopy on 9/1 with finding of nonbleeding erosive gastropathy and multiple ulcers in the rectum due to fecal impaction but no bleeding.  Repeat flex sig colonoscopy on 06/27/2019 with findings of continues area nonbleeding ulcerated mucosa in the distal rectum with friable mucosa and old blood, no active bleeding.  Hemostatic spray was deployed throughout the entire rectum. --continue daily MiraLAX, Metamucil --Carafate enema for 2 weeks. --High-fiber diet --GI started Bismuth 270m PO QID, Flagyl 257mPO QID, tetracycline 50076mID x 2 weeks --Protonix 23m64m BID x 2 weeks, then transition to once daily --Consideration of colonoscopy for colon cancer screening in the future --Will need follow-up H. pylori testing 8 weeks following antibiotic treatment to document eradication  Acute on chronic normocytic anemia:  Secondary to GI blood loss and ESRD.   --Repeat flexible sigmoidoscopy 9/4 with no active bleeding appreciated, s/p hemostatic spray --Patient received 2 units of PRBCs on 06/23/2019 and 2 units on 06/27/2019 --Hemoglobin this morning is 6.2-->8.2 --Continue fosrenol 1000mg45mTID and ARANESP once weekly  with HD --Repeat CBC in a.m.  Hypoxia: Resolved On presentation he was  noted to be hypoxic with  desaturation on room air. Pneumonia suspected.  CT had shown left pleural effusion, proBNP was elevated.  Continue volume control with dialysis. Last chest x-ray shows left base atelectasis.  Currently on room air  Chest pain/elevated troponin:  Elevated troponin but they are flat.  Most likely secondary to supply demand ischemia due to ESRD. Denied any chest pain today.  EKG did not show any significant ST changes  ESRD on HD TTS Continue HD per nephrology  Thoracic aortic dissection:  Status post endovascular stent placement.  Vascular surgery was following. Continue outpatient follow-up  Hypertension:  --Continue amlodipine 10 mg p.o. daily and labetalol 300 mg p.o. 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 Abnormal spinal cord at T9-T10, T10-11, possible segmental cord infarct.  Neurosurgery,neurology was consulted and they signed off.  No plan for intervention. Recommended daily baby aspirin by neurology.   Patient [facility] neurolog[facility] His strength on bilateral lower extremities have improved. PT recommending CIR. Rehab coordinator/Social worker aware.    CIR to reevaluate on Monday for transfer if hemoglobin remains stable.   DVT prophylaxis: SCDs Code Status: Full code Family Communication: None Disposition Plan: Continue inpatient, hopeful to CIR on Monday.   Consultants:   Nephrology  Infectious disease - Dr. CMegan Salon signed off 8/28; f/u ID clinic outpatient  Vascular surgery - Dr. FOneida Alar Neurosurgery - Dr. JArnoldo Morale signed off 8/26  Neurology - Dr. XErlinda Hong signed off 8/26; [facility]  Gastroenterology - Dr. BTarri Glenn signed off 9/5  Procedures:    IR disc aspiration - 8/25  Flexible sigmoidoscopy 06/24/2019  EGD 06/24/2019  Flexible sigmoidoscopy 06/27/2019  Antimicrobials:   Vancomycin/cefepime with HD with planned end date 07/29/2019.   Subjective: Patient seen and examined at bedside following return HD.  Reports some mild fatigue.  States that he has normal bowel movements without further bleeding today.  Hemoglobin stable at 8.2 following transfusion 2 units PRBCs yesterday.  No other complaints or concerns at this time.  Denies headache, no chest pain, no shortness of breath, no cough/congestion, no fever/chills/night sweats, no paresthesias.  No acute events overnight per nurse staff.  Objective: Vitals:   06/28/19 1202 06/28/19 1209 06/28/19 1215 06/28/19 1245  BP: (!) 184/97 (!) 178/102 (!) 183/102 (!) 177/87  Pulse: 90 90 87 91  Resp:      Temp:      TempSrc:      SpO2:      Weight:      Height:        Intake/Output Summary (Last 24 hours) at 06/28/2019 1326 Last data filed at 06/28/2019 1221 Gross per 24 hour  Intake 1465.5 ml  Output 0 ml  Net 1465.5 ml   Filed Weights   06/26/19 2033 06/27/19 0932 06/28/19 0410  Weight: 87.5 kg 87.5 kg 88 kg    Examination:  General exam: Appears calm and comfortable  Respiratory system: Clear to auscultation. Respiratory effort normal. Cardiovascular system: S1 & S2 heard, RRR. No JVD, murmurs, rubs, gallops or clicks. No pedal edema. Gastrointestinal system: Abdomen is nondistended, soft, mild bilateral lower quadrant tenderness, No organomegaly or masses felt.  Normal bowel sounds heard. Central nervous system: Alert and oriented. No focal neurological deficits. Extremities: Symmetric 5 x 5 power. Skin: No rashes, lesions or ulcers Psychiatry: Judgement and insight appear normal. Mood & affect appropriate.     Data Reviewed: I have personally reviewed following labs and imaging studies  CBC: Recent Labs  Lab 06/22/19 1101  06/23/19 0608  06/24/19 6226  06/25/19 0316  06/26/19 0357 06/26/19 1439 06/27/19 0829 06/27/19 0957 06/27/19 1820 06/28/19 0518  WBC 23.1*  --  21.1*  --  11.8* 10.6*  --  11.2*  --   --   --   --  7.7  NEUTROABS 18.5*  --  17.2*  --   --  7.9*  --   --   --   --   --   --   --   HGB 6.2*   < > 6.8*   < > 7.9* 8.2*   < > 7.2* 7.6* 6.2* 6.1* 6.9* 8.2*  HCT 19.2*   < > 20.9*   < > 24.8* 25.3*   < > 22.6* 23.2* 19.5* 18.0* 20.8* 25.3*  MCV 95.5  --  91.7  --  91.2 91.3  --  91.5  --   --   --   --  89.4  PLT 303  --  291  --  286 290  --  278  --   --   --   --  258   < > = values in this interval not displayed.   Basic Metabolic Panel: Recent Labs  Lab 06/23/19 0608 06/24/19 0638 06/25/19 0316 06/26/19 0726 06/27/19 0957 06/28/19 0518  NA 133* 133* 134* 130* 132* 132*  K 4.5 4.7 3.8 4.2 4.0 4.5  CL 96* 97* 94* 91* 95* 94*  CO2 '24 22 25 25  ' --  22  GLUCOSE 134* 96 92 103* 89 93  BUN 52* 27* 15 35* 30* 46*  CREATININE 8.82* 5.63* 4.07* 7.29* 5.90* 7.98*  CALCIUM 8.9 8.4* 8.8* 8.5*  --  8.6*  PHOS  --   --   --  4.9*  --   --    GFR: Estimated Creatinine Clearance: 12.1 mL/min (A) (by C-G formula based on SCr of 7.98 mg/dL (H)). Liver Function Tests: Recent Labs  Lab 06/26/19 0726  ALBUMIN 1.9*   No results for input(s): LIPASE, AMYLASE in the last 168 hours. No results for input(s): AMMONIA in the last 168 hours. Coagulation Profile: No results for input(s): INR, PROTIME in the last 168 hours. Cardiac Enzymes: No results for input(s): CKTOTAL, CKMB, CKMBINDEX, TROPONINI in the last 168 hours. BNP (last 3 results) No results for input(s): PROBNP in the last 8760 hours. HbA1C: No results for input(s): HGBA1C in the last 72 hours. CBG: No results for input(s): GLUCAP in the last 168 hours. Lipid Profile: No results for input(s): CHOL, HDL, LDLCALC, TRIG, CHOLHDL, LDLDIRECT in the last 72 hours. Thyroid Function Tests: No results for input(s): TSH, T4TOTAL, FREET4, T3FREE, THYROIDAB in the last  72 hours. Anemia Panel: No results for input(s): VITAMINB12, FOLATE, FERRITIN, TIBC, IRON, RETICCTPCT in the last 72 hours. Sepsis Labs: No results for input(s): PROCALCITON, LATICACIDVEN in the last 168 hours.  No results found for this or any previous visit (from the past 240 hour(s)).       Radiology Studies: No results found.      Scheduled Meds:  amLODipine  10 mg Oral QHS   aspirin EC  81 mg Oral  Daily   atorvastatin  20 mg Oral [id number]   bismuth subsalicylate  002 mg Oral QID   [START ON 07/03/2019] darbepoetin (ARANESP) injection - DIALYSIS  150 mcg Intravenous Q Thu-HD   hydrALAZINE  100 mg Oral Q8H   labetalol  300 mg Oral BID   lanthanum  1,000 mg Oral TID WC   metroNIDAZOLE  250 mg Oral QID   pantoprazole  40 mg Oral BID   polyethylene glycol  17 g Oral Daily   sodium chloride flush  3 mL Intravenous Q12H   Sucralfate Enema (sucralfate 2g/sterile water 54m enema)  2 g Rectal BID   tetracycline  500 mg Oral QID   Continuous Infusions:  sodium chloride     ceFEPime (MAXIPIME) IV 2 g (06/26/19 1811)   vancomycin       LOS: 15 days    Time spent: 31 minutes spent on chart review, discussion with nursing staff, consultants, personally reviewing all imaging and labs, updating family and interview/physical exam; more than 50% of that time was spent in counseling and/or coordination of care.    Uri [place]ough J [place] DO [facility] Pager [phone] If 7PM-7AM, please contact night-coverage [email or web address] Password [email or web address] 06/28/2019, 1:26 PM

## 2019-06-28 NOTE — Progress Notes (Signed)
Patient was actively bleeding from his rectum, Rapid was called she assess the patient. GI and Attending was notify. Sucralfate was given. Attending came and assess the patient. Patient is stable vitals are 159/90, pulse 94, 99% on room air respiration 16. Patient is resting comfortably will continue to monitor.

## 2019-06-28 NOTE — Social Work (Signed)
CSW acknowledging consult for SNF placement on 9/1. TOC team following for appropriate disposition at this time.  Aware CIR interested in pt admitting to them for continued rehab, and for admission when medically appropriate.   Westley Hummer, MSW, [place] Work [phone]

## 2019-06-28 NOTE — Progress Notes (Signed)
    Progress Note   Subjective  Feeling better today.  No rectal pain.  No additional bleeding following his flexible sigmoidoscopy.   Objective  Vital signs in last 24 hours: Temp:  [98.8 F (37.1 C)-99.5 F (37.5 C)] 98.8 F (37.1 C) (09/05 0935) Pulse Rate:  [69-91] 81 (09/05 0935) Resp:  [16-18] 18 (09/05 0935) BP: (131-176)/(71-98) 176/90 (09/05 0935) SpO2:  [93 %-100 %] 100 % (09/05 0935) Weight:  [88 kg] 88 kg (09/05 0410) Last BM Date: 06/26/19  General: Alert, well-developed, in NAD Abdomen:  Soft, nontender and nondistended. Normal bowel sounds, without guarding, and without rebound.   Neurologic:  Alert and  oriented x4; grossly normal neurologically. Psych:  Alert and cooperative. Normal mood and affect.  Intake/Output from previous day: 09/04 0701 - 09/05 0700 In: 1745.5 [P.O.:720; I.V.:133.5; Blood:892] Out: 0  Intake/Output this shift: Total I/O In: 120 [P.O.:120] Out: -   Lab Results: Recent Labs    06/26/19 0357  06/27/19 0957 06/27/19 1820 06/28/19 0518  WBC 11.2*  --   --   --  7.7  HGB 7.2*   < > 6.1* 6.9* 8.2*  HCT 22.6*   < > 18.0* 20.8* 25.3*  PLT 278  --   --   --  258   < > = values in this interval not displayed.   BMET Recent Labs    06/26/19 0726 06/27/19 0957 06/28/19 0518  NA 130* 132* 132*  K 4.2 4.0 4.5  CL 91* 95* 94*  CO2 25  --  22  GLUCOSE 103* 89 93  BUN 35* 30* 46*  CREATININE 7.29* 5.90* 7.98*  CALCIUM 8.5*  --  8.6*   LFT Recent Labs    06/26/19 0726  ALBUMIN 1.9*     Assessment & Recommendations  Bleeding stercocal rectal ulcer due to recent fecal impaction    -Treating with 2 weeks of Carafate enemas    -Single session of hemo-spray 06/27/2019 H. pylori gastritis on bismuth, Flagyl, tetracycline, and Protonix for 2 weeks  Appears to be clinically responding to Carafate enemas and single session of hemo-spray treatment yesterday.  He is at high risk for rebleeding.  I recommend an aggressive bowel  regimen including Metamucil and MiraLAX as recommended on discharge.  He should also complete a full 2 weeks of Carafate enemas.  He is completing 14 days of treatment for H. pylori gastritis.  Follow-up testing 8 weeks after completing antibiotics recommended to document eradication.  No additional GI recommendations at this time. GI will sign-off. Please call the on-call gastroenterologist with any additional questions or concerns during this hospitalization.      LOS: 15 days   [place]  06/28/2019, 12:22 PM

## 2019-06-28 NOTE — Progress Notes (Addendum)
[name]   Subjective:  Seen in room - for HD later today. Says feeling better today, no further GI bleeding  that he has  noticed.  Objective Vitals:   06/27/19 2324 06/27/19 2351 06/28/19 0313 06/28/19 0410  BP: (!) 161/98 (!) 156/85 (!) 155/98 (!) 159/81  Pulse: 85 73 80 75  Resp: 16 17 17 16   Temp: 99.3 F (37.4 C) 99.1 F (37.3 C) 99.5 F (37.5 C) 99.1 F (37.3 C)  TempSrc: Oral Oral Oral Oral  SpO2: 97% 97% 100% 100%  Weight:    88 kg  Height:       Physical Exam General: Well appearing, frail man. NAD Heart: RRR; no murmur Lungs: CTAB Abdomen: soft, non-tender Extremities: No LE edema Dialysis Access: R TDC, no tenderness  Additional Objective Labs: Basic Metabolic Panel: Recent Labs  Lab 06/25/19 0316 06/26/19 0726 06/27/19 0957 06/28/19 0518  NA 134* 130* 132* 132*  K 3.8 4.2 4.0 4.5  CL 94* 91* 95* 94*  CO2 25 25  --  22  GLUCOSE 92 103* 89 93  BUN 15 35* 30* 46*  CREATININE 4.07* 7.29* 5.90* 7.98*  CALCIUM 8.8* 8.5*  --  8.6*  PHOS  --  4.9*  --   --    Liver Function Tests: Recent Labs  Lab 06/26/19 0726  ALBUMIN 1.9*   CBC: Recent Labs  Lab 06/22/19 1101  06/23/19 0608  06/24/19 [id number] 06/25/19 0316  06/26/19 0357  06/27/19 0957 06/27/19 1820 06/28/19 0518  WBC 23.1*  --  21.1*  --  11.8* 10.6*  --  11.2*  --   --   --  7.7  NEUTROABS 18.5*  --  17.2*  --   --  7.9*  --   --   --   --   --   --   HGB 6.2*   < > 6.8*   < > 7.9* 8.2*   < > 7.2*   < > 6.1* 6.9* 8.2*  HCT 19.2*   < > 20.9*   < > 24.8* 25.3*   < > 22.6*   < > 18.0* 20.8* 25.3*  MCV 95.5  --  91.7  --  91.2 91.3  --  91.5  --   --   --  89.4  PLT 303  --  291  --  286 290  --  278  --   --   --  258   < > = values in this interval not displayed.   Medications: . sodium chloride    . ceFEPime (MAXIPIME) IV 2 g (06/26/19 1811)  . vancomycin     . amLODipine  10 mg Oral QHS  . aspirin EC  81 mg Oral Daily  . atorvastatin  20 mg Oral  q1800  . bismuth subsalicylate  99991111 mg Oral QID  . [START ON 07/03/2019] darbepoetin (ARANESP) injection - DIALYSIS  150 mcg Intravenous Q Thu-HD  . hydrALAZINE  25 mg Oral Q8H  . labetalol  200 mg Oral BID  . lanthanum  1,000 mg Oral TID WC  . metroNIDAZOLE  250 mg Oral QID  . pantoprazole  40 mg Oral BID  . polyethylene glycol  17 g Oral Daily  . sodium chloride flush  3 mL Intravenous Q12H  . Sucralfate Enema (sucralfate 2g/sterile water 61ml enema)  2 g Rectal BID  . tetracycline  500 mg Oral QID    Dialysis Orders: [facility]  4:15hr,  400/1.5, 87.5kg, 2/2.25, Profile #2, R TDC, no heparin.  - Parsabiv 2.5mg  IV q HD  - Calcitriol 1.24mcg PO q HD - home BP Rx > labetalol 300 bid/ amlodipine 10 hs/ hydralazine 100 tid/ losartan 100 qd/ minoxidil 5 bid  Assessment/Plan: 1. L4-5 lumbar diskitis: s/p recent aortic stentgraft.BCx on admit negative. Per ID, will need 6 weeks of IV Vanc + Cefepime with HD (until 07/29/2019). 2. Spinal cord infarct: per spinal MRI 8/23, after complaints of not being able to move legs. Neurosurgery consulted, no plan for intervention, recommend daily ASA. CIR has evaluated, offered placement.  3. Hematochezia: GI consulted.Flex sig 9/1 showed stigmata of recent bleeding and diffusely ulcerated rectum. EGD with few non-bleeding erosions in gastric antrum. Recommended miralax, carafate - try to limit < 2 weeks. Repeat Flex sig 9/4 with hemostatic spray to entire rectum. On Tetracycline + Flagyl as well. 4. ESRD: Continue HD on TTS schedule - no heparin. For HD later today. 5. RecentType B aortic dissection (s/p stent graft repair 8/14): Goal SBP per VVS 120-170. Known severe resistant HTN. Trying to avoid BP drops on HD. On ASA 6. Anemia (ESRD + ABLA): S/p 5U PRBCs this admit. Continue max dose Aranesp 200 mcg IV weekly (last 9/3). 7. Secondary hyperparathyroidism -Ca/phos in goal. Continue calcitriol. Parsabiv not on hospital formulary. Binders changed to  Fosrenol d/t concern that Auryxia irritating his bowels. 8. HTN/volume: Goal SBP per VVS 120-170. Back on home amlodipine, partial hydralazine and labetalol (home losartan/minoxidil remain on hold).   9. Nutrition: Alb very low, resume supplements. 10. Recent MSSA bacteremia/AVG infection: S/p AVG removal and Ancef course completed.  Frank Penton, PA-C 06/28/2019, 9:09 AM  [name] Pager: [phone]  Pt seen, examined and agree w A/P as above.  [name]  MD 06/28/2019, 11:08 AM

## 2019-06-28 NOTE — Plan of Care (Signed)
  Problem: Education: Goal: Knowledge of General Education information will improve Description Including pain rating scale, medication(s)/side effects and non-pharmacologic comfort measures Outcome: Progressing   

## 2019-06-28 NOTE — Progress Notes (Signed)
Per RN patient returned from HD with GI bleed/blood clots.  BP 160/95  HR 113  RR 16  O2 sat 99% on RA.  Carafate enema given.  Patient is fully alert and oriented.

## 2019-06-29 ENCOUNTER — Other Ambulatory Visit: Payer: Self-pay

## 2019-06-29 ENCOUNTER — Encounter (HOSPITAL_COMMUNITY): Payer: Self-pay | Admitting: Gastroenterology

## 2019-06-29 DIAGNOSIS — D500 Iron deficiency anemia secondary to blood loss (chronic): Secondary | ICD-10-CM

## 2019-06-29 LAB — CBC
HCT: 18.9 % — ABNORMAL LOW (ref 39.0–52.0)
Hemoglobin: 6 g/dL — CL (ref 13.0–17.0)
MCH: 28.4 pg (ref 26.0–34.0)
MCHC: 31.7 g/dL (ref 30.0–36.0)
MCV: 89.6 fL (ref 80.0–100.0)
Platelets: 228 10*3/uL (ref 150–400)
RBC: 2.11 MIL/uL — ABNORMAL LOW (ref 4.22–5.81)
RDW: 15.9 % — ABNORMAL HIGH (ref 11.5–15.5)
WBC: 6.7 10*3/uL (ref 4.0–10.5)
nRBC: 0 % (ref 0.0–0.2)

## 2019-06-29 LAB — HEMOGLOBIN AND HEMATOCRIT, BLOOD
HCT: 22.3 % — ABNORMAL LOW (ref 39.0–52.0)
Hemoglobin: 7.3 g/dL — ABNORMAL LOW (ref 13.0–17.0)

## 2019-06-29 LAB — PREPARE RBC (CROSSMATCH)

## 2019-06-29 MED ORDER — SODIUM CHLORIDE 0.9% IV SOLUTION
Freq: Once | INTRAVENOUS | Status: AC
  Administered 2019-06-29: 12:00:00 via INTRAVENOUS

## 2019-06-29 NOTE — Plan of Care (Signed)
  Problem: Clinical Measurements: Goal: Ability to maintain clinical measurements within normal limits will improve Outcome: Progressing   

## 2019-06-29 NOTE — Progress Notes (Signed)
PROGRESS NOTE    Frank Fuller  SEG:[id number] DOB: 03-05-1961 DOA: 06/13/2019 PCP: [facility]   Brief Narrative:   Frank Fuller is a 58 year old male with history of hypertension, ESRD on dialysis (TTS), abdominal aortic aneurysm status post stent, hepatitis C virus status post treatment, depression, CVA who initially presented to [facility] with multiple complaints.  He was recently hospitalized from 8/11-8/20 for dissection of thoracic aorta (type B) and was treated with endovascular stent graft with spinal drain by Dr. Oneida Alar on 8/14.  He was discharged home but immediately came back with complaints of urge to use the restroom but unable to do so.  He also had fever.  He was found to have paraplegia.  He was suspected to have thoracic spinal ischemia.  CT imaging also showed possible osteomyelitis/discitis of L4-L5.  Also suspected to have sepsis secondary to possible pneumonia. Underwent disc aspiration by IR on 06/18/2019.  Neurosurgery, vascular surgery, ID were following.   Hospital course remarkable for multiple  episodes of bloody bowel movement with clots. FOBT positive with acute blood loss anemia. Underwent  EGD and flexible sigmoidoscopy on 06/24/19 with findings of recal ulceration. Waiting for transfer to CIR/SNF.   Assessment & Plan:   Principal Problem:   Lumbar discitis Active Problems:   ESRD on hemodialysis (HCC)   Ileus (HCC)   Proctitis   H/O endovascular stent graft for abdominal aortic aneurysm   Gastric ulcer   Rectal ulceration   H. pylori infection   Suspected sepsis secondary to pneumonia vs discitis/osteomyelitis:  Presented with fever of 102F, leukocytosis.  ESR 138, CRP /procalcitonin was elevated.  CT also showed possible right-sided pneumonia with Iileus and proctitis.  MRI lumbar spine showed new space-occupying material in the lower retroperitoneum and lumbosacral junction. Favored acute discitis and osteomyelitis at L4-L5 with  pervertebral phlegmon. Cultures from [facility] did not show any growth.  Cultures sent here has not shown any growth.   --IR consulted and underwent disc aspiration 8/25; culture negative --ID consulted, recommended 6 weeks IV vancomycin/cefepime w/ HD; plan end date 07/29/2019 --Plan to check inflammatory markers q2 weeks per ID (Next: 06/30/2019)  Lower GI bleed:  Erosive gastropathy: Rectal ulcers: H. pylori gastritis: Patient had several episodes of bloody bowel movement with clots and also complained of abdominal pain few days ago .  FOBT positive. CT angiogram did not show any active bleeding or any other acute abnormality.  Underwent  EGD and flexible sigmoidoscopy on 9/1 with finding of nonbleeding erosive gastropathy and multiple ulcers in the rectum due to fecal impaction but no bleeding.  Repeat flex sig colonoscopy on 06/27/2019 with findings of continues area nonbleeding ulcerated mucosa in the distal rectum with friable mucosa and old blood, no active bleeding.  Hemostatic spray was deployed throughout the entire rectum. --rectal bleeding returned yesterday; GI notified, Dr. Tarri Glenn states not much to do other other supportive care, ie transfusions, miralax, metamucil and ensure he is receiving carafate enemas. --continue daily MiraLAX, Metamucil --Carafate enema for 2 weeks, was not receiving past 2 days for unclear reason; now restarted --High-fiber diet --GI started Bismuth 227m PO QID, Flagyl 2530mPO QID, tetracycline 50017mID x 2 weeks --Protonix 66m12m BID x 2 weeks, then transition to once daily --Transfuse 2u pRBC's today; lab only transfusing 1 at a time due to low supply of O+ blood --Discussed with GI, IR unlikely to be able to offer any intervention and that surgery would be last resort given rectal ulceration  is below the dentate line --Consideration of colonoscopy for colon cancer screening in the future --Will need follow-up H. pylori testing 8 weeks following  antibiotic treatment to document eradication  Acute on chronic normocytic anemia:  Secondary to GI blood loss and ESRD.   --Repeat flexible sigmoidoscopy 9/4 with no active bleeding appreciated, s/p hemostatic spray --Patient received 2 units of PRBCs on 06/23/2019, 2 units on 06/27/2019, plan 2u on 9/6 --Hemoglobin this morning is 6.2-->8.2-->6.0 --Continue fosrenol 1072m PO TID and ARANESP once weekly with HD --Transfuse as above today --Repeat CBC in a.m.  Hypoxia: Resolved On presentation he was  noted to be hypoxic with desaturation on room air. Pneumonia suspected.  CT had shown left pleural effusion, proBNP was elevated.  Continue volume control with dialysis. Last chest x-ray shows left base atelectasis.  Currently on room air  Chest pain/elevated troponin:  Elevated troponin but they are flat.  Most likely secondary to supply demand ischemia due to ESRD. Denied any chest pain today.  EKG did not show any significant ST changes  ESRD on HD TTS Continue HD per nephrology  Thoracic aortic dissection:  Status post endovascular stent placement.  Vascular surgery was following. Continue outpatient follow-up  Hypertension:  --Continue amlodipine 10 mg p.o. daily and labetalol 300 mg p.o. 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 Abnormal spinal cord at T9-T10, T10-11, possible segmental cord infarct.  Neurosurgery,neurology was consulted and they signed off.  No plan for intervention. Recommended daily baby aspirin by neurology.   Patient to follow-up [facility] bilateral lower [facility] have [facility]. PT recommending CIR. Rehab coordinator/Social worker aware.    CIR to reevaluate on Monday/Tuesday for  transfer if hemoglobin remains stable.   DVT prophylaxis: SCDs Code Status: Full code Family Communication: None Disposition Plan: Continue inpatient, transfuse, ensure receiving Carafate enemas twice daily, to be reevaluated by CIR hopefully early next week for transfer if hemoglobin remains stable   Consultants:   Nephrology  Infectious disease - Dr. CaMegan Salonsigned off 8/28; f/u ID clinic outpatient  Vascular surgery - Dr. FiOneida AlarNeurosurgery - Dr. JeArnoldo Moralesigned off 8/26  Neurology - Dr. XuErlinda Hongsigned off 8/26; [facility]  Gastroenterology - Dr. BeTarri Glennsigned off 9/5  Procedures:   IR disc aspiration - 8/25  Flexible sigmoidoscopy 06/24/2019  EGD 06/24/2019  Flexible sigmoidoscopy 06/27/2019  Antimicrobials:   Vancomycin/cefepime with HD with planned end date 07/29/2019.   Subjective: Patient seen and examined at bedside, complains of some mild leg pain/cramping, requesting pain medication.  Patient had return of rectal bleeding yesterday, reports none today.  Hemoglobin down to 6.0.  Will transfuse PRBCs.  Reports no recurrence of bloody stool today so far. No other complaints or concerns at this time.  Denies headache, no chest pain, no shortness of breath, no cough/congestion, no fever/chills/night sweats, no paresthesias.  No acute events overnight per nurse staff.  Objective: Vitals:   06/28/19 1826 06/28/19 2117 06/29/19 0545 06/29/19 0840  BP: (!) 149/75 (!) 165/88 (!) 142/80 133/68  Pulse: (!) 101 (!) 105 83 86  Resp: '18 17 19 18  ' Temp: 98.9 F (37.2 C) (!) 100.4 F (38 C) 98.5 F (36.9 C) 98.8 F (37.1 C)  TempSrc: Oral Oral Oral Oral  SpO2: 94% 100% 97% 93%  Weight:  90.7 kg    Height:        Intake/Output Summary (Last 24 hours) at 06/29/2019 1135 Last data filed at 06/29/2019 0900 Gross per 24 hour  Intake 560 ml  Output 269 ml  Net 291 ml   Filed Weights   06/28/19 0410 06/28/19 1202 06/28/19 2117  Weight: 88 kg 90.8  kg 90.7 kg    Examination:  General exam: Appears calm and comfortable  Respiratory system: Clear to auscultation. Respiratory effort normal. Cardiovascular system: S1 & S2 heard, RRR. No JVD, murmurs, rubs, gallops or clicks. No pedal edema. Gastrointestinal system: Abdomen is nondistended, soft, mild bilateral lower quadrant tenderness, No organomegaly or masses felt. Normal bowel sounds heard. Central nervous system: Alert and oriented. No focal neurological deficits. Extremities: Symmetric 5 x 5 power. Skin: No rashes, lesions or ulcers Psychiatry: Judgement and insight appear normal. Mood & affect appropriate.     Data Reviewed: I have personally reviewed following labs and imaging studies  CBC: Recent Labs  Lab 06/23/19 0608  06/24/19 1443 06/25/19 0316  06/26/19 0357  06/27/19 0957 06/27/19 1820 06/28/19 0518 06/28/19 1637 06/29/19 0620  WBC 21.1*  --  11.8* 10.6*  --  11.2*  --   --   --  7.7  --  6.7  NEUTROABS 17.2*  --   --  7.9*  --   --   --   --   --   --   --   --   HGB 6.8*   < > 7.9* 8.2*   < > 7.2*   < > 6.1* 6.9* 8.2* 7.4* 6.0*  HCT 20.9*   < > 24.8* 25.3*   < > 22.6*   < > 18.0* 20.8* 25.3* 22.5* 18.9*  MCV 91.7  --  91.2 91.3  --  91.5  --   --   --  89.4  --  89.6  PLT 291  --  286 290  --  278  --   --   --  258  --  228   < > = values in this interval not displayed.   Basic Metabolic Panel: Recent Labs  Lab 06/23/19 0608 06/24/19 0638 06/25/19 0316 06/26/19 0726 06/27/19 0957 06/28/19 0518  NA 133* 133* 134* 130* 132* 132*  K 4.5 4.7 3.8 4.2 4.0 4.5  CL 96* 97* 94* 91* 95* 94*  CO2 '24 22 25 25  ' --  22  GLUCOSE 134* 96 92 103* 89 93  BUN 52* 27* 15 35* 30* 46*  CREATININE 8.82* 5.63* 4.07* 7.29* 5.90* 7.98*  CALCIUM 8.9 8.4* 8.8* 8.5*  --  8.6*  PHOS  --   --   --  4.9*  --   --    GFR: Estimated Creatinine Clearance: 12.1 mL/min (A) (by C-G formula based on SCr of 7.98 mg/dL (H)). Liver Function Tests: Recent Labs  Lab 06/26/19  0726  ALBUMIN 1.9*   No results for input(s): LIPASE, AMYLASE in the last 168 hours. No results for input(s): AMMONIA in the last 168 hours. Coagulation Profile: No results for input(s): INR, PROTIME in the last 168 hours. Cardiac Enzymes: No results for input(s): CKTOTAL, CKMB, CKMBINDEX, TROPONINI in the last 168 hours. BNP (last 3 results) No results for input(s): PROBNP in the last 8760 hours. HbA1C: No results for input(s): HGBA1C in the last 72 hours. CBG: No results for input(s): GLUCAP in the last 168 hours. Lipid Profile:  No results for input(s): CHOL, HDL, LDLCALC, TRIG, CHOLHDL, LDLDIRECT in the last 72 hours. Thyroid Function Tests: No results for input(s): TSH, T4TOTAL, FREET4, T3FREE, THYROIDAB in the last 72 hours. Anemia Panel: No results for input(s): VITAMINB12, FOLATE, FERRITIN, TIBC, IRON, RETICCTPCT in the last 72 hours. Sepsis Labs: No results for input(s): PROCALCITON, LATICACIDVEN in the last 168 hours.  No results found for this or any previous visit (from the past 240 hour(s)).       Radiology Studies: No results found.      Scheduled Meds: . sodium chloride   Intravenous Once  . amLODipine  10 mg Oral QHS  . aspirin EC  81 mg Oral Daily  . atorvastatin  20 mg Oral q1800  . bismuth subsalicylate  004 mg Oral QID  . [START ON 07/03/2019] darbepoetin (ARANESP) injection - DIALYSIS  150 mcg Intravenous Q Thu-HD  . hydrALAZINE  100 mg Oral Q8H  . labetalol  300 mg Oral BID  . lanthanum  1,000 mg Oral TID WC  . metroNIDAZOLE  250 mg Oral QID  . pantoprazole  40 mg Oral BID  . polyethylene glycol  17 g Oral Daily  . sodium chloride flush  3 mL Intravenous Q12H  . Sucralfate Enema (sucralfate 2g/sterile water 32m enema)  2 g Rectal BID  . tetracycline  500 mg Oral QID   Continuous Infusions: . sodium chloride    . ceFEPime (MAXIPIME) IV 2 g (06/28/19 1820)  . vancomycin Stopped (06/28/19 1638)     LOS: 16 days    Time spent: 36  minutes spent on chart review, discussion with nursing staff, consultants, personally reviewing all imaging and labs, updating family and interview/physical exam; more than 50% of that time was spent in counseling and/or coordination of care.    [place] Pak J [place] DO [facility] Pager [phone] If 7PM-7AM, please contact night-coverage [email or web address] Password [email or web address] 06/29/2019, 11:35 AM

## 2019-06-29 NOTE — Progress Notes (Addendum)
    Progress Note   Subjective  Had another bloody bowel movement yesterday afternoon around 15:30 after receiving IV heparin with dialysis at 14:14. No further bleeding. Had a non-bloody bowel movement today.  Feeling better today.  No rectal pain.  No family present at the bedside.    Objective  Vital signs in last 24 hours: Temp:  [97.9 F (36.6 C)-100.4 F (38 C)] 99 F (37.2 C) (09/06 1132) Pulse Rate:  [51-105] 51 (09/06 1132) Resp:  [14-22] 14 (09/06 1132) BP: (114-198)/(68-102) 114/68 (09/06 1132) SpO2:  [92 %-100 %] 100 % (09/06 1132) Weight:  [90.7 kg-90.8 kg] 90.7 kg (09/05 2117) Last BM Date: 06/26/19  General: Alert, well-developed, in NAD Abdomen:  Soft, nontender and nondistended. Normal bowel sounds, without guarding, and without rebound.   Neurologic:  Alert and  oriented x4; grossly normal neurologically. Psych:  Alert and cooperative. Normal mood and affect.  Intake/Output from previous day: 09/05 0701 - 09/06 0700 In: 480 [P.O.:480] Out: 269  Intake/Output this shift: Total I/O In: 200 [P.O.:200] Out: -   Lab Results: Recent Labs    06/28/19 0518 06/28/19 1637 06/29/19 0620  WBC 7.7  --  6.7  HGB 8.2* 7.4* 6.0*  HCT 25.3* 22.5* 18.9*  PLT 258  --  228   BMET Recent Labs    06/27/19 0957 06/28/19 0518  NA 132* 132*  K 4.0 4.5  CL 95* 94*  CO2  --  22  GLUCOSE 89 93  BUN 30* 46*  CREATININE 5.90* 7.98*  CALCIUM  --  8.6*     Assessment & Recommendations  Bleeding stercocal rectal ulcer due to recent fecal impaction    -Treating with 2 weeks of Carafate enemas    -Single session of hemo-spray 06/27/2019 with no bleeding for over 24 hours    -recurrent bleeding occurred after receiving IV heparin in dialysis H. pylori gastritis on bismuth, Flagyl, tetracycline, and Protonix for 2 weeks  Additional rectal bleeding occurred yesterday after receiving IV heparin with dialysis. Hemoglobin now 6. Two units of PRBCs planned for today. Would  work to minimize heparin exposure while rectal ulceration is healing. Complete 14 days total of Carafate enemas. Aggressive bowel regimen including Metamucil and MiraLAX is recommended on discharge.   He is completing 14 days of treatment for H. pylori gastritis.  Follow-up testing 8 weeks after completing antibiotics recommended to document eradication.    LOS: 16 days   [place]ton [place]  06/29/2019, 11:39 AM

## 2019-06-29 NOTE — Progress Notes (Signed)
Pt had small bloody movement from rectum. Carafate enema given. Patient tolerated well.

## 2019-06-29 NOTE — Progress Notes (Signed)
Pharmacy Antibiotic Note  Frank Fuller is a 58 y.o. male admitted on 06/13/2019 with lumbar discitis. Pharmacy has been consulted to dose vancomycin and cefepime.   Day #17 of vancomycin and cefepime. ID recommends to continue for 6 weeks total therapy ending 07/29/19.   WBC downtrending and wnl. Febrile (Tmax 100.4 9/5 at 2130). Last HD on 9/5 lasting ~1.5 hours with BFR 400. Vancomycin post-HD dose held and cefepime post-HD dose administered.     Plan: - Continue vancomycin 1000mg  qHD on TTS - Obtain pre-HD vancomycin level on 9/8 AM (goal 15-51mcg/ml) and adjust vancomycin dose if needed  - Continue cefepime 2g IV qHD TTS - Monitor CBC, C/S, clinical progress  Height: 6\' 4"  (193 cm) Weight: 199 lb 15.3 oz (90.7 kg) IBW/kg (Calculated) : 86.8  Temp (24hrs), Avg:98.8 F (37.1 C), Min:97.9 F (36.6 C), Max:100.4 F (38 C)  Recent Labs  Lab 06/24/19 [id number] 06/25/19 0316 06/26/19 0357 06/26/19 0725 06/26/19 0726 06/27/19 0957 06/28/19 0518 06/29/19 0620  WBC 11.8* 10.6* 11.2*  --   --   --  7.7 6.7  CREATININE 5.63* 4.07*  --   --  7.29* 5.90* 7.98*  --   VANCORANDOM  --   --   --  17  --   --   --   --     Estimated Creatinine Clearance: 12.1 mL/min (A) (by C-G formula based on SCr of 7.98 mg/dL (H)).    Allergies  Allergen Reactions  . Oxycodone Nausea Only  . Chlorhexidine Other (See Comments)    Unknown reaction Patch skin test done at dialysis 06/26/17  - staff using clear dressing and alcohol to clean exit site of catheter  . Clonidine Derivatives Other (See Comments)    Dizziness   . Lisinopril Other (See Comments)    unresponsive  . Carvedilol Rash    Antimicrobials this admission: Vanc 8/21 >> 07/29/19 Cefepime 8/21 >> 07/29/19  Antimicrobial dose adjustments this admission:  9/3: Vancomycin 1250mg  post-HD instead of vancomycin 1000mg  post-HD based on pre-HD level 17 and estimated post-HD level ~9.5.   Microbiology results: 8/23 BCx >> ngtd 8/25 IR disc  aspirate >>NGTD 8/25 fluid culture canceled  Thank you for allowing pharmacy to be a part of this patient's care.  Cristela Felt, PharmD PGY1 Pharmacy Resident [employer]: [phone]  06/29/2019 8:31 AM

## 2019-06-29 NOTE — Progress Notes (Addendum)
Frank Fuller Progress Note   Subjective:  Had another bloody BM yesterday - rapid response called. Vitals stable, no further bleeding. Denies CP/dyspnea today.  Objective Vitals:   06/28/19 1826 06/28/19 2117 06/29/19 0545 06/29/19 0840  BP: (!) 149/75 (!) 165/88 (!) 142/80 133/68  Pulse: (!) 101 (!) 105 83 86  Resp: 18 17 19 18   Temp: 98.9 F (37.2 C) (!) 100.4 F (38 C) 98.5 F (36.9 C) 98.8 F (37.1 C)  TempSrc: Oral Oral Oral Oral  SpO2: 94% 100% 97% 93%  Weight:  90.7 kg    Height:       Physical Exam General: Well appearing, frail man. NAD Heart: RRR; no murmur Lungs: CTAB Abdomen: soft, non-tender Extremities: No LE edema Dialysis Access: R TDC, no tenderness  Additional Objective Labs: Basic Metabolic Panel: Recent Labs  Lab 06/25/19 0316 06/26/19 0726 06/27/19 0957 06/28/19 0518  NA 134* 130* 132* 132*  K 3.8 4.2 4.0 4.5  CL 94* 91* 95* 94*  CO2 25 25  --  22  GLUCOSE 92 103* 89 93  BUN 15 35* 30* 46*  CREATININE 4.07* 7.29* 5.90* 7.98*  CALCIUM 8.8* 8.5*  --  8.6*  PHOS  --  4.9*  --   --    Liver Function Tests: Recent Labs  Lab 06/26/19 0726  ALBUMIN 1.9*   CBC: Recent Labs  Lab 06/22/19 1101  06/23/19 0608  06/24/19 [id number] 06/25/19 0316  06/26/19 0357  06/28/19 0518 06/28/19 1637 06/29/19 0620  WBC 23.1*  --  21.1*  --  11.8* 10.6*  --  11.2*  --  7.7  --  6.7  NEUTROABS 18.5*  --  17.2*  --   --  7.9*  --   --   --   --   --   --   HGB 6.2*   < > 6.8*   < > 7.9* 8.2*   < > 7.2*   < > 8.2* 7.4* 6.0*  HCT 19.2*   < > 20.9*   < > 24.8* 25.3*   < > 22.6*   < > 25.3* 22.5* 18.9*  MCV 95.5  --  91.7  --  91.2 91.3  --  91.5  --  89.4  --  89.6  PLT 303  --  291  --  286 290  --  278  --  258  --  228   < > = values in this interval not displayed.   Medications: . sodium chloride    . ceFEPime (MAXIPIME) IV 2 g (06/28/19 1820)  . vancomycin Stopped (06/28/19 1638)   . sodium chloride   Intravenous Once  . amLODipine   10 mg Oral QHS  . aspirin EC  81 mg Oral Daily  . atorvastatin  20 mg Oral q1800  . bismuth subsalicylate  99991111 mg Oral QID  . [START ON 07/03/2019] darbepoetin (ARANESP) injection - DIALYSIS  150 mcg Intravenous Q Thu-HD  . hydrALAZINE  100 mg Oral Q8H  . labetalol  300 mg Oral BID  . lanthanum  1,000 mg Oral TID WC  . metroNIDAZOLE  250 mg Oral QID  . pantoprazole  40 mg Oral BID  . polyethylene glycol  17 g Oral Daily  . sodium chloride flush  3 mL Intravenous Q12H  . Sucralfate Enema (sucralfate 2g/sterile water 48ml enema)  2 g Rectal BID  . tetracycline  500 mg Oral QID    Dialysis Orders: [facility]  4:15hr,  400/1.5, 87.5kg, 2/2.25, Profile #2, R TDC, no heparin.  - Parsabiv 2.5mg  IV q HD  - Calcitriol 1.78mcg PO q HD - home BP Rx > labetalol 300 bid/ amlodipine 10 hs/ hydralazine 100 tid/ losartan 100 qd/ minoxidil 5 bid  Assessment/Plan: 1. Spinal cord infarct/ paraperesis: per spinal MRI 8/23 at T9- T11, after complaints of not being able to move legs. 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 Neurosurgery consulted, no plan for intervention, recommend daily ASA. CIR has evaluated, offered placement   2. L4-L5 lumbar discitis: s/p recent aortic stentgraft.BCx on admit negative. Per ID, will need 6 weeks of IV Vanc + Cefepime with HD (until 07/29/2019).  3. Hematochezia: GI consulted.Flex sig 9/1 showed stigmata of recent bleeding and diffusely ulcerated rectum. EGD with few non-bleeding erosions in gastric antrum. Recommended miralax, carafate - try to limit < 2 weeks. Repeat Flex sig 9/4 with hemostatic spray to entire rectum. On Tetracycline + Flagyl as well for possible H.pylori. Another small bloody BM 9/5 - follow. 4. ESRD: Continue HD on TTS schedule - no heparin. Next HD 9/8. 5. RecentType B aortic dissection (s/p stent graft repair 8/14): Goal SBP per VVS 120-170. Known severe resistant HTN. Trying to avoid BP drops on HD. On  ASA 6. Anemia (ESRD + ABLA): S/p 5U PRBCs this admit. Continue max dose Aranesp 200 mcg IV weekly (last 9/3). Hgb down to 6 - ordered for another 2U PRBCs today. 7. Secondary hyperparathyroidism -Ca/phos in goal. Continue calcitriol. Parsabiv not on hospital formulary. Binders changed to Fosrenol d/t concern that Auryxia irritating his bowels. 8. HTN/volume: Goal SBP per VVS 120-170. Back on home amlodipine, partial hydralazine and labetalol (home losartan/minoxidil remain on hold).   9. Nutrition: Alb very low, resume supplements. 10. Recent MSSA bacteremia/AVG infection: S/p AVG removal and Ancef course completed.  Frank Penton, PA-C 06/29/2019, 9:25 AM  Frank Fuller Pager: [phone]  Pt seen, examined and agree w A/P as above.  [name]  MD 06/29/2019, 4:41 PM

## 2019-06-30 LAB — TYPE AND SCREEN
ABO/RH(D): O POS
Antibody Screen: NEGATIVE
Unit division: 0
Unit division: 0
Unit division: 0

## 2019-06-30 LAB — SEDIMENTATION RATE: Sed Rate: 67 mm/hr — ABNORMAL HIGH (ref 0–16)

## 2019-06-30 LAB — BASIC METABOLIC PANEL
Anion gap: 16 — ABNORMAL HIGH (ref 5–15)
BUN: 62 mg/dL — ABNORMAL HIGH (ref 6–20)
CO2: 22 mmol/L (ref 22–32)
Calcium: 8.4 mg/dL — ABNORMAL LOW (ref 8.9–10.3)
Chloride: 93 mmol/L — ABNORMAL LOW (ref 98–111)
Creatinine, Ser: 10.69 mg/dL — ABNORMAL HIGH (ref 0.61–1.24)
GFR calc Af Amer: 5 mL/min — ABNORMAL LOW (ref >60)
GFR calc non Af Amer: 5 mL/min — ABNORMAL LOW (ref >60)
Glucose, Bld: 97 mg/dL (ref 70–99)
Potassium: 4.5 mmol/L (ref 3.5–5.1)
Sodium: 131 mmol/L — ABNORMAL LOW (ref 135–145)

## 2019-06-30 LAB — CBC
HCT: 22 % — ABNORMAL LOW (ref 39.0–52.0)
Hemoglobin: 7.3 g/dL — ABNORMAL LOW (ref 13.0–17.0)
MCH: 29.2 pg (ref 26.0–34.0)
MCHC: 33.2 g/dL (ref 30.0–36.0)
MCV: 88 fL (ref 80.0–100.0)
Platelets: 237 10*3/uL (ref 150–400)
RBC: 2.5 MIL/uL — ABNORMAL LOW (ref 4.22–5.81)
RDW: 15.7 % — ABNORMAL HIGH (ref 11.5–15.5)
WBC: 5.8 10*3/uL (ref 4.0–10.5)
nRBC: 0 % (ref 0.0–0.2)

## 2019-06-30 LAB — BPAM RBC
Blood Product Expiration Date: 202010072359
Blood Product Expiration Date: 202010102359
Blood Product Expiration Date: 202010102359
ISSUE DATE / TIME: 202009041230
ISSUE DATE / TIME: 202009042323
ISSUE DATE / TIME: 202009061142
Unit Type and Rh: 5100
Unit Type and Rh: 5100
Unit Type and Rh: 5100

## 2019-06-30 LAB — C-REACTIVE PROTEIN: CRP: 5.5 mg/dL — ABNORMAL HIGH (ref <1.0)

## 2019-06-30 LAB — MAGNESIUM: Magnesium: 1.9 mg/dL (ref 1.7–2.4)

## 2019-06-30 MED ORDER — CHLORHEXIDINE GLUCONATE CLOTH 2 % EX PADS
6.0000 | MEDICATED_PAD | Freq: Every day | CUTANEOUS | Status: DC
  Administered 2019-07-01 – 2019-07-02 (×2): 6 via TOPICAL

## 2019-06-30 MED ORDER — OXYCODONE HCL 5 MG PO TABS
10.0000 mg | ORAL_TABLET | ORAL | Status: DC | PRN
  Administered 2019-06-30 – 2019-07-03 (×8): 10 mg via ORAL
  Filled 2019-06-30 (×6): qty 2

## 2019-06-30 MED FILL — Sucralfate Susp 1 GM/10ML: ORAL | Qty: 20 | Status: AC

## 2019-06-30 NOTE — Progress Notes (Signed)
PROGRESS NOTE    Frank Fuller  RSW:[id number] DOB: 03-06-1961 DOA: 06/13/2019 PCP: [facility]   Brief Narrative:   Frank Fuller is a 58 year old male with history of hypertension, ESRD on dialysis (TTS), abdominal aortic aneurysm status post stent, hepatitis C virus status post treatment, depression, CVA who initially presented to [facility] with multiple complaints.  He was recently hospitalized from 8/11-8/20 for dissection of thoracic aorta (type B) and was treated with endovascular stent graft with spinal drain by Dr. Oneida Alar on 8/14.  He was discharged home but immediately came back with complaints of urge to use the restroom but unable to do so.  He also had fever.  He was found to have paraplegia.  He was suspected to have thoracic spinal ischemia.  CT imaging also showed possible osteomyelitis/discitis of L4-L5.  Also suspected to have sepsis secondary to possible pneumonia. Underwent disc aspiration by IR on 06/18/2019.  Neurosurgery, vascular surgery, ID were following.   Hospital course remarkable for multiple  episodes of bloody bowel movement with clots. FOBT positive with acute blood loss anemia. Underwent  EGD and flexible sigmoidoscopy on 06/24/19 with findings of recal ulceration. Waiting for transfer to CIR/SNF.   Assessment & Plan:   Principal Problem:   Lumbar discitis Active Problems:   ESRD on hemodialysis (HCC)   Anemia due to GI blood loss   Ileus (HCC)   Proctitis   H/O endovascular stent graft for abdominal aortic aneurysm   Gastric ulcer   Rectal ulceration   H. pylori infection   Sepsis secondary to discitis/osteomyelitis:  Presented with fever of 102F, leukocytosis.  ESR 138, CRP /procalcitonin was elevated.  CT also showed possible right-sided pneumonia with Iileus and proctitis.  MRI lumbar spine showed new space-occupying material in the lower retroperitoneum and lumbosacral junction. Favored acute discitis and osteomyelitis at L4-L5 with  pervertebral phlegmon. Cultures from [facility] did not show any growth.  Cultures sent here has not shown any growth.   --IR consulted and underwent disc aspiration 8/25; culture negative --ID consulted, recommended 6 weeks IV vancomycin/cefepime w/ HD; plan end date 07/29/2019 --Plan to check inflammatory markers q2 weeks per ID (Next: 07/14/2019)  Lower GI bleed:  Erosive gastropathy: Rectal ulcers: H. pylori gastritis: Patient had several episodes of bloody bowel movement with clots and also complained of abdominal pain few days ago .  FOBT positive. CT angiogram did not show any active bleeding or any other acute abnormality.  Underwent  EGD and flexible sigmoidoscopy on 9/1 with finding of nonbleeding erosive gastropathy and multiple ulcers in the rectum due to fecal impaction but no bleeding.  Repeat flex sig colonoscopy on 06/27/2019 with findings of continues area nonbleeding ulcerated mucosa in the distal rectum with friable mucosa and old blood, no active bleeding.  Hemostatic spray was deployed throughout the entire rectum. --continue daily MiraLAX, Metamucil --Carafate enema BID for 2 weeks --High-fiber diet --GI started Bismuth 257m PO QID, Flagyl 2571mPO QID, tetracycline 50045mID x 2 weeks --Protonix 58m43m BID x 2 weeks, then transition to once daily --Transfused 1u pRBC's yesterday with an appropriate rise of his hemoglobin to 7.3, which remained stable with repeat 7.3 this morning --Consideration of colonoscopy for colon cancer screening in the future --Will need follow-up H. pylori testing 8 weeks following antibiotic treatment to document eradication --GI now on standby, consider repeat flex sig w/ APC if rebleeding occurs --Will increase oxycodone from 5 to 10 mg every 4 hours as needed for rectal  pain  Acute on chronic normocytic anemia:  Secondary to GI blood loss and ESRD.   --Repeat flexible sigmoidoscopy 9/4 with no active bleeding appreciated, s/p  hemostatic spray --Patient received 2 units of PRBCs on 06/23/2019, 2 units on 06/27/2019, 1 unit on 9/6 --Hemoglobin this morning is 6.2-->8.2-->6.0-->7.3-->7.3 --Continue fosrenol '1000mg'$  PO TID and ARANESP once weekly with HD --Repeat CBC in a.m.  Hypoxia: Resolved On presentation he was  noted to be hypoxic with desaturation on room air. Pneumonia suspected.  CT had shown left pleural effusion, proBNP was elevated.  Continue volume control with dialysis. Last chest x-ray shows left base atelectasis.  Currently on room air  Chest pain/elevated troponin:  Elevated troponin but they are flat.  Most likely secondary to supply demand ischemia due to ESRD. Denied any chest pain today.  EKG did not show any significant ST changes  ESRD on HD TTS Continue HD per nephrology  Thoracic aortic dissection:  Status post endovascular stent placement.  Vascular surgery was following. Continue outpatient follow-up  Hypertension:  --Continue amlodipine 10 mg p.o. daily and labetalol 300 mg p.o. 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 Abnormal spinal cord at T9-T10, T10-11, possible segmental cord infarct.  Neurosurgery,neurology was consulted and they signed off.  No plan for intervention. Recommended daily baby aspirin by neurology.   Patient to follow-up with neurology [name] in 4 weeks at [facility]. His strength on bilateral lower extremities have improved. PT recommending CIR. Rehab coordinator/Social worker aware.    CIR to reevaluate on Monday/Tuesday for transfer if hemoglobin remains stable.   DVT prophylaxis: SCDs Code Status: Full code Family Communication: None Disposition Plan: Continue inpatient, transfuse, ensure receiving Carafate enemas twice daily, to be  reevaluated by CIR hopefully early next week for transfer if hemoglobin remains stable   Consultants:   Nephrology  Infectious disease - Dr. Megan Salon, signed off 8/28; f/u ID clinic outpatient  Vascular surgery - Dr. Oneida Alar  Neurosurgery - Dr. Arnoldo Morale; signed off 8/26  Neurology - Dr. Erlinda Hong; signed off 8/26; f/u with [name] at [facility] in 4 weeks  Gastroenterology - Dr. Tarri Glenn, signed off 9/5  Procedures:   IR disc aspiration - 8/25  Flexible sigmoidoscopy 06/24/2019  EGD 06/24/2019  Flexible sigmoidoscopy 06/27/2019  Antimicrobials:   Vancomycin/cefepime with HD with planned end date 07/29/2019.   Subjective: Patient seen and examined at bedside, emotional and frustrated this morning about hospital course and all the problems he is encountered most recently.  Continues to report rectal pain especially at nighttime; and pain medication that he is currently getting not lasting very long.  Does report that his lower extremity weakness is slowly getting better.  Nursing reports small dark tarry stool yesterday afternoon, otherwise none since.  Hemoglobin stable this morning at 7.3. No other complaints or concerns at this time.  Denies headache, no chest pain, no shortness of breath, no cough/congestion, no fever/chills/night sweats, no paresthesias.  No acute events overnight per nurse staff.  Objective: Vitals:   06/29/19 1523 06/29/19 1826 06/29/19 2030 06/30/19 0445  BP: (!) 143/70 (!) 150/71 (!) 158/84 (!) 177/80  Pulse: 75 62 82 83  Resp: '18 18 17 16  '$ Temp: 98 F (36.7 C) 98.1 F (36.7 C) 98.2 F (36.8 C) 97.9 F (36.6 C)  TempSrc: Oral Oral    SpO2: 98% 98%  100% 92%  Weight:   90.7 kg   Height:        Intake/Output Summary (Last 24 hours) at 06/30/2019 1205 Last data filed at 06/30/2019 1057 Gross per 24 hour  Intake 1096 ml  Output 0 ml  Net 1096 ml   Filed Weights   06/28/19 1202 06/28/19 2117 06/29/19 2030  Weight: 90.8 kg 90.7 kg 90.7 kg    Examination:   General exam: Appears calm and comfortable  Respiratory system: Clear to auscultation. Respiratory effort normal. Cardiovascular system: S1 & S2 heard, RRR. No JVD, murmurs, rubs, gallops or clicks. No pedal edema. Gastrointestinal system: Abdomen is nondistended, soft, mild bilateral lower quadrant tenderness, No organomegaly or masses felt. Normal bowel sounds heard. Central nervous system: Alert and oriented. No focal neurological deficits. Extremities: Symmetric 5 x 5 power. Skin: No rashes, lesions or ulcers Psychiatry: Judgement and insight appear normal. Mood & affect appropriate.     Data Reviewed: I have personally reviewed following labs and imaging studies  CBC: Recent Labs  Lab 06/25/19 0316  06/26/19 0357  06/28/19 0518 06/28/19 1637 06/29/19 0620 06/29/19 1655 06/30/19 0642  WBC 10.6*  --  11.2*  --  7.7  --  6.7  --  5.8  NEUTROABS 7.9*  --   --   --   --   --   --   --   --   HGB 8.2*   < > 7.2*   < > 8.2* 7.4* 6.0* 7.3* 7.3*  HCT 25.3*   < > 22.6*   < > 25.3* 22.5* 18.9* 22.3* 22.0*  MCV 91.3  --  91.5  --  89.4  --  89.6  --  88.0  PLT 290  --  278  --  258  --  228  --  237   < > = values in this interval not displayed.   Basic Metabolic Panel: Recent Labs  Lab 06/24/19 0638 06/25/19 0316 06/26/19 0726 06/27/19 0957 06/28/19 0518 06/30/19 0642  NA 133* 134* 130* 132* 132* 131*  K 4.7 3.8 4.2 4.0 4.5 4.5  CL 97* 94* 91* 95* 94* 93*  CO2 _0 --  22 22  GLUCOSE 96 92 103* 89 93 97  BUN 27* 15 35* 30* 46* 62*  CREATININE 5.63* 4.07* 7.29* 5.90* 7.98* 10.69*  CALCIUM 8.4* 8.8* 8.5*  --  8.6* 8.4*  MG  --   --   --   --   --  1.9  PHOS  --   --  4.9*  --   --   --    GFR: Estimated Creatinine Clearance: 9 mL/min (A) (by C-G formula based on SCr of 10.69 mg/dL (H)). Liver Function Tests: Recent Labs  Lab 06/26/19 0726  ALBUMIN 1.9*   No results for input(s): LIPASE, AMYLASE in the last 168 hours. No results for input(s): AMMONIA in the  last 168 hours. Coagulation Profile: No results for input(s): INR, PROTIME in the last 168 hours. Cardiac Enzymes: No results for input(s): CKTOTAL, CKMB, CKMBINDEX, TROPONINI in the last 168 hours. BNP (last 3 results) No results for input(s): PROBNP in the last 8760 hours. HbA1C: No results for input(s): HGBA1C in the last 72 hours. CBG: No results for input(s): GLUCAP in the last 168 hours. Lipid Profile: No results for input(s): CHOL, HDL, LDLCALC, TRIG, CHOLHDL, LDLDIRECT in the last 72 hours. Thyroid Function Tests: No results for input(s): TSH, T4TOTAL, FREET4, T3FREE, THYROIDAB in the last  72 hours. Anemia Panel: No results for input(s): VITAMINB12, FOLATE, FERRITIN, TIBC, IRON, RETICCTPCT in the last 72 hours. Sepsis Labs: No results for input(s): PROCALCITON, LATICACIDVEN in the last 168 hours.  No results found for this or any previous visit (from the past 240 hour(s)).       Radiology Studies: No results found.      Scheduled Meds: . amLODipine  10 mg Oral QHS  . aspirin EC  81 mg Oral Daily  . atorvastatin  20 mg Oral q1800  . bismuth subsalicylate  842 mg Oral QID  . Chlorhexidine Gluconate Cloth  6 each Topical Q0600  . [START ON 07/03/2019] darbepoetin (ARANESP) injection - DIALYSIS  150 mcg Intravenous Q Thu-HD  . hydrALAZINE  100 mg Oral Q8H  . labetalol  300 mg Oral BID  . lanthanum  1,000 mg Oral TID WC  . metroNIDAZOLE  250 mg Oral QID  . pantoprazole  40 mg Oral BID  . polyethylene glycol  17 g Oral Daily  . sodium chloride flush  3 mL Intravenous Q12H  . Sucralfate Enema (sucralfate 2g/sterile water 74m enema)  2 g Rectal BID  . tetracycline  500 mg Oral QID   Continuous Infusions: . sodium chloride    . ceFEPime (MAXIPIME) IV 2 g (06/28/19 1820)  . vancomycin Stopped (06/28/19 1638)     LOS: 17 days    Time spent: 38 minutes spent on chart review, discussion with nursing staff, consultants, personally reviewing all imaging and labs,  updating family and interview/physical exam; more than 50% of that time was spent in counseling and/or coordination of care.     [place] DO [facility] Pager [phone] If 7PM-7AM, please contact night-coverage [email or web address] Password [email or web address] 06/30/2019, 12:05 PM

## 2019-06-30 NOTE — Progress Notes (Signed)
PT Cancellation Note  Patient Details Name: Frank Fuller MRN: [id number] DOB: Aug 03, 1961   Cancelled Treatment:    Reason Eval/Treat Not Completed: Other (comment)   [name] would like to be able to schedule therapy sessions to be able to prepare himself; He seems to need more control over his schedule -- understandable, given the amount of life change that his spinal cord infarct/medical status forces upon him; He politely declines today;   We discussed working together to plan therapy sessions; Will try to call his room at close to 8am tomorrow to figure out tomorrow's time; He is agreeable to around 2:30 pm for PT if he goes to HD on first round tomorrow;   I still favor CIR for more intensive therapies for Mr. Grandville Silos -- and, it sounds like that venue may be even better for his style, given that they are much more able to stick to a schedule on CIR.  [name], [place]  Acute Rehabilitation Services Pager [phone] Office [phone]    [name] 06/30/2019, 3:49 PM

## 2019-06-30 NOTE — Progress Notes (Signed)
[facility] Progress Note   Dialysis Orders: [facility] 4:15hr,400/1.5,87.5kg,2/2.25, Profile #2, R TDC, no heparin. - Parsabiv 2.5mg  IV q HD  - Calcitriol 1.50mcg PO q HD - home BP Rx > labetalol 300 bid/ amlodipine 10 hs/ hydralazine 100 tid/ losartan 100 qd/ minoxidil 5 bid  Assessment/Plan: 1. Spinal cord infarct/ paraperesis: per spinal MRI 8/23 at T9- T11, after complaints of not being able to move legs. 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 Neurosurgery consulted, no plan for intervention, recommend daily ASA. CIR has evaluated, offered placement   2. L4-L5 lumbar discitis: s/p recent aortic stentgraft.BCx on admit negative. Per ID, will need 6 weeks of IV Vanc + Cefepime with HD (until 07/29/2019).  3. Hematochezia:GI consulted.Flex sig 9/1 showedstigmata of recent bleedinganddiffusely ulcerated rectum.EGD with fewnon-bleeding erosions in gastric antrum. Recommended miralax, carafate - try to limit < 2 weeks. Repeat Flex sig 9/4 withhemostatic spray to entire rectum.On Tetracycline + Flagyl as well for possible H.pylori. Another small bloody BM 9/5 during dialysis 4. ESRD: Continue HD on TTS schedule - no heparin. Next HD 9/8. 5. RecentType B aortic dissection (s/p stent graft repair 8/14): Goal SBP per VVS 120-170. Known severe resistant HTN. Trying to avoid BP drops on HD. On ASA 6. Anemia(ESRD + ABLA): S/p5U PRBCs this admit. Continue max doseAranesp 200 mcg IVweekly (last 9/3).hgb up to 7.3 after 1 units PRBC 9/6 (6 units to date) 7. Secondary hyperparathyroidism -Ca/phos in goal. Continue calcitriol. Parsabiv not on hospital formulary.Binders changed to Fosrenol d/t concern that Auryxia irritating his bowels. 8. HTN/volume: Goal SBP per VVS 120-170.Back on homeamlodipine,partialhydralazineandlabetalol (home losartan/minoxidil remain on hold). 9. Nutrition: Alb very low, resume supplements.- diet  liberalized to regular - have added fluid restriction today. 10. Recent MSSA bacteremia/AVG infection: S/p AVG removal andAncefcoursecompleted. 11. Depression - offered chaplain services - declined - would benefit by palliative care consult.  Myriam Jacobson, PA-C [place] 06/30/2019,8:50 AM  LOS: 17 days   Subjective:   Overwhelmed by his multiple medical problems/personal issues. C/o lower abdominal pain. C/o too much fluid on his tray.  Objective Vitals:   06/29/19 1523 06/29/19 1826 06/29/19 2030 06/30/19 0445  BP: (!) 143/70 (!) 150/71 (!) 158/84 (!) 177/80  Pulse: 75 62 82 83  Resp: 18 18 17 16   Temp: 98 F (36.7 C) 98.1 F (36.7 C) 98.2 F (36.8 C) 97.9 F (36.6 C)  TempSrc: Oral Oral    SpO2: 98% 98% 100% 92%  Weight:   90.7 kg   Height:       Physical Exam General: slender somewhat frail tearful at times today Heart: RRR no murmur Lungs: grossly clear Abdomen: soft ND + BS generalized lower abdominal tenderness Extremities: no LE edema Dialysis Access:  Right [facility]   Additional Objective Labs: Basic Metabolic Panel: Recent Labs  Lab 06/26/19 0726 06/27/19 0957 06/28/19 0518 06/30/19 0642  NA 130* 132* 132* 131*  K 4.2 4.0 4.5 4.5  CL 91* 95* 94* 93*  CO2 25  --  22 22  GLUCOSE 103* 89 93 97  BUN 35* 30* 46* 62*  CREATININE 7.29* 5.90* 7.98* 10.69*  CALCIUM 8.5*  --  8.6* 8.4*  PHOS 4.9*  --   --   --    Liver Function Tests: Recent Labs  Lab 06/26/19 0726  ALBUMIN 1.9*   No results for input(s): LIPASE, AMYLASE in the last 168 hours. CBC: Recent Labs  Lab 06/25/19 0316  06/26/19  [id number]  06/28/19 0518  06/29/19 0620 06/29/19 1655 06/30/19 0642  WBC 10.6*  --  11.2*  --  7.7  --  6.7  --  5.8  NEUTROABS 7.9*  --   --   --   --   --   --   --   --   HGB 8.2*   < > 7.2*   < > 8.2*   < > 6.0* 7.3* 7.3*  HCT 25.3*   < > 22.6*   < > 25.3*   < > 18.9* 22.3* 22.0*  MCV 91.3  --  91.5  --  89.4  --  89.6  --   88.0  PLT 290  --  278  --  258  --  228  --  237   < > = values in this interval not displayed.   Blood Culture    Component Value Date/Time   SDES WOUND 06/17/2019 1512   SPECREQUEST L4 L5 DISC ASPIRATION 06/17/2019 1512   CULT  06/17/2019 1512    No growth aerobically or anaerobically. Performed at [facility], [place] [address] [place], [place] [address]    REPTSTATUS 06/22/2019 FINAL 06/17/2019 1512    Cardiac Enzymes: No results for input(s): CKTOTAL, CKMB, CKMBINDEX, TROPONINI in the last 168 hours. CBG: No results for input(s): GLUCAP in the last 168 hours. Iron Studies: No results for input(s): IRON, TIBC, TRANSFERRIN, FERRITIN in the last 72 hours. Lab Results  Component Value Date   INR 1.3 (H) 06/17/2019   INR 1.3 (H) 06/05/2019   INR 1.3 (H) 06/03/2019   Studies/Results: No results found. Medications: . sodium chloride    . ceFEPime (MAXIPIME) IV 2 g (06/28/19 1820)  . vancomycin Stopped (06/28/19 1638)   . amLODipine  10 mg Oral QHS  . aspirin EC  81 mg Oral Daily  . atorvastatin  20 mg Oral q1800  . bismuth subsalicylate  99991111 mg Oral QID  . [START ON 07/03/2019] darbepoetin (ARANESP) injection - DIALYSIS  150 mcg Intravenous Q Thu-HD  . hydrALAZINE  100 mg Oral Q8H  . labetalol  300 mg Oral BID  . lanthanum  1,000 mg Oral TID WC  . metroNIDAZOLE  250 mg Oral QID  . pantoprazole  40 mg Oral BID  . polyethylene glycol  17 g Oral Daily  . sodium chloride flush  3 mL Intravenous Q12H  . Sucralfate Enema (sucralfate 2g/sterile water 58ml enema)  2 g Rectal BID  . tetracycline  500 mg Oral QID

## 2019-06-30 NOTE — Progress Notes (Signed)
Inpatient Rehabilitation Admissions Coordinator  Patient last up out of bed 9/1 with therapy . Therapy to work with him today. Pt complains of severe rectal pain. I await pt's ability to tolerate the intensity of therapy before proceeding with an inpt rehab admit. I will follow.  Danne Baxter, RN, MSN Rehab Admissions Coordinator [phone] 06/30/2019 12:50 PM

## 2019-06-30 NOTE — Progress Notes (Signed)
Patient had a black tarry stool today. MD notify

## 2019-06-30 NOTE — Progress Notes (Signed)
Pt had incontinent medium black tarry stool.

## 2019-06-30 NOTE — Progress Notes (Signed)
    Progress Note   Subjective  Dark black BM yesterday. Ongoing rectal pain. Particularly bothersome at night.  No family present at the bedside.    Objective  Vital signs in last 24 hours: Temp:  [97.9 F (36.6 C)-99 F (37.2 C)] 97.9 F (36.6 C) (09/07 0445) Pulse Rate:  [51-83] 83 (09/07 0445) Resp:  [14-18] 16 (09/07 0445) BP: (114-177)/(61-84) 177/80 (09/07 0445) SpO2:  [92 %-100 %] 92 % (09/07 0445) Weight:  [90.7 kg] 90.7 kg (09/06 2030) Last BM Date: 06/26/19  General: Alert, well-developed, in NAD Abdomen:  Soft, nontender and nondistended. Normal bowel sounds, without guarding, and without rebound.   Neurologic:  Alert and  oriented x4; grossly normal neurologically. Psych:  Alert and cooperative. Normal mood and affect.  Lab Results: Recent Labs    06/28/19 0518  06/29/19 0620 06/29/19 1655 06/30/19 0642  WBC 7.7  --  6.7  --  5.8  HGB 8.2*   < > 6.0* 7.3* 7.3*  HCT 25.3*   < > 18.9* 22.3* 22.0*  PLT 258  --  228  --  237   < > = values in this interval not displayed.   BMET Recent Labs    06/28/19 0518 06/30/19 0642  NA 132* 131*  K 4.5 4.5  CL 94* 93*  CO2 22 22  GLUCOSE 93 97  BUN 46* 62*  CREATININE 7.98* 10.69*  CALCIUM 8.6* 8.4*     Assessment & Recommendations  Bleeding stercocal rectal ulcer due to recent fecal impaction    -Treating with 2 weeks of Carafate enemas    -Single session of hemo-spray 06/27/2019 with no bleeding for over 24 hours    -recurrent bleeding occurred after receiving IV heparin in dialysis H. pylori gastritis on bismuth, Flagyl, tetracycline, and Protonix for 2 weeks  Additional rectal bleeding occurred 06/28/19 after receiving IV heparin with dialysis and he received 2 units of PRBCs. Hemoglobin stable since that time. No additional overt bleeding. Continue to minimize heparin exposure while rectal ulceration is healing. Complete 14 days total of Carafate enemas. Aggressive bowel regimen including Metamucil and  MiraLAX to avoid hard stools and/or straining.   He is completing 14 days of treatment for H. pylori gastritis.  Follow-up testing 8 weeks after completing antibiotics recommended to document eradication.   GI will move to stand-by. Please call the on-call gastroenterologist with any additional questions or concerns. We could consider repeating flex sig with assessment for possible APC if rebleeding occurs.    LOS: 17 days   [place]  06/30/2019, 10:25 AM

## 2019-07-01 LAB — CBC
HCT: 22 % — ABNORMAL LOW (ref 39.0–52.0)
HCT: 21.9 % — ABNORMAL LOW (ref 39.0–52.0)
Hemoglobin: 7.2 g/dL — ABNORMAL LOW (ref 13.0–17.0)
Hemoglobin: 7 g/dL — ABNORMAL LOW (ref 13.0–17.0)
MCH: 29 pg (ref 26.0–34.0)
MCH: 28.8 pg (ref 26.0–34.0)
MCHC: 32.7 g/dL (ref 30.0–36.0)
MCHC: 32 g/dL (ref 30.0–36.0)
MCV: 88.7 fL (ref 80.0–100.0)
MCV: 90.1 fL (ref 80.0–100.0)
Platelets: 261 10*3/uL (ref 150–400)
Platelets: 230 10*3/uL (ref 150–400)
RBC: 2.48 MIL/uL — ABNORMAL LOW (ref 4.22–5.81)
RBC: 2.43 MIL/uL — ABNORMAL LOW (ref 4.22–5.81)
RDW: 15.9 % — ABNORMAL HIGH (ref 11.5–15.5)
RDW: 16 % — ABNORMAL HIGH (ref 11.5–15.5)
WBC: 5.5 10*3/uL (ref 4.0–10.5)
WBC: 5.5 10*3/uL (ref 4.0–10.5)
nRBC: 0 % (ref 0.0–0.2)
nRBC: 0 % (ref 0.0–0.2)

## 2019-07-01 LAB — PREPARE RBC (CROSSMATCH)

## 2019-07-01 LAB — RENAL FUNCTION PANEL
Albumin: 1.8 g/dL — ABNORMAL LOW (ref 3.5–5.0)
Anion gap: 16 — ABNORMAL HIGH (ref 5–15)
BUN: 74 mg/dL — ABNORMAL HIGH (ref 6–20)
CO2: 21 mmol/L — ABNORMAL LOW (ref 22–32)
Calcium: 8.6 mg/dL — ABNORMAL LOW (ref 8.9–10.3)
Chloride: 94 mmol/L — ABNORMAL LOW (ref 98–111)
Creatinine, Ser: 13.24 mg/dL — ABNORMAL HIGH (ref 0.61–1.24)
GFR calc Af Amer: 4 mL/min — ABNORMAL LOW (ref >60)
GFR calc non Af Amer: 4 mL/min — ABNORMAL LOW (ref >60)
Glucose, Bld: 99 mg/dL (ref 70–99)
Phosphorus: 7.5 mg/dL — ABNORMAL HIGH (ref 2.5–4.6)
Potassium: 4.9 mmol/L (ref 3.5–5.1)
Sodium: 131 mmol/L — ABNORMAL LOW (ref 135–145)

## 2019-07-01 LAB — VANCOMYCIN, RANDOM: Vancomycin Rm: 13

## 2019-07-01 MED ORDER — PENTAFLUOROPROP-TETRAFLUOROETH EX AERO: 1.0000 "application " | INHALATION_SPRAY | CUTANEOUS | Status: DC | PRN

## 2019-07-01 MED ORDER — ALTEPLASE 2 MG IJ SOLR: 2.0000 mg | Freq: Once | INTRAMUSCULAR | Status: DC | PRN

## 2019-07-01 MED ORDER — LIDOCAINE-PRILOCAINE 2.5-2.5 % EX CREA: 1.0000 "application " | TOPICAL_CREAM | CUTANEOUS | Status: DC | PRN

## 2019-07-01 MED ORDER — HEPARIN SODIUM (PORCINE) 1000 UNIT/ML DIALYSIS
1000.0000 [IU] | INTRAMUSCULAR | Status: DC | PRN
  Administered 2019-07-01: 11:00:00 3400 [IU] via INTRAVENOUS_CENTRAL

## 2019-07-01 MED ORDER — VANCOMYCIN HCL 500 MG IV SOLR
500.0000 mg | Freq: Once | INTRAVENOUS | Status: AC
  Administered 2019-07-01: 500 mg via INTRAVENOUS
  Filled 2019-07-01: qty 500

## 2019-07-01 MED ORDER — VANCOMYCIN HCL IN DEXTROSE 1-5 GM/200ML-% IV SOLN
INTRAVENOUS | Status: AC
  Administered 2019-07-01: 1000 mg via INTRAVENOUS
  Filled 2019-07-01: qty 200

## 2019-07-01 MED ORDER — SODIUM CHLORIDE 0.9 % IV SOLN: 100.0000 mL | INTRAVENOUS | Status: DC | PRN

## 2019-07-01 MED ORDER — LIDOCAINE HCL (PF) 1 % IJ SOLN: 5.0000 mL | INTRAMUSCULAR | Status: DC | PRN

## 2019-07-01 MED ORDER — OXYCODONE HCL 5 MG PO TABS
ORAL_TABLET | ORAL | Status: AC
  Filled 2019-07-01: qty 2

## 2019-07-01 MED ORDER — SODIUM CHLORIDE 0.9% IV SOLUTION: Freq: Once | INTRAVENOUS | Status: DC

## 2019-07-01 MED ORDER — HEPARIN SODIUM (PORCINE) 1000 UNIT/ML IJ SOLN
INTRAMUSCULAR | Status: AC
  Administered 2019-07-01: 3400 [IU] via INTRAVENOUS_CENTRAL
  Filled 2019-07-01: qty 4

## 2019-07-01 MED FILL — Sucralfate Susp 1 GM/10ML: ORAL | Qty: 20 | Status: AC

## 2019-07-01 NOTE — Plan of Care (Signed)
  Problem: Elimination: Goal: Will not experience complications related to bowel motility Outcome: Progressing   Problem: Health Behavior/Discharge Planning: Goal: Ability to manage health-related needs will improve Outcome: Progressing

## 2019-07-01 NOTE — Plan of Care (Signed)
  Problem: Health Behavior/Discharge Planning: Goal: Ability to manage health-related needs will improve Outcome: Progressing   Problem: Health Behavior/Discharge Planning: Goal: Ability to manage health-related needs will improve Outcome: Progressing   Problem: Education: Goal: Knowledge of General Education information will improve Description: Including pain rating scale, medication(s)/side effects and non-pharmacologic comfort measures Outcome: Completed/Met   Problem: Clinical Measurements: Goal: Ability to maintain clinical measurements within normal limits will improve Outcome: Completed/Met Goal: Diagnostic test results will improve Outcome: Completed/Met Goal: Respiratory complications will improve Outcome: Completed/Met Goal: Cardiovascular complication will be avoided Outcome: Completed/Met   Problem: Coping: Goal: Level of anxiety will decrease Outcome: Completed/Met   Problem: Elimination: Goal: Will not experience complications related to urinary retention Outcome: Completed/Met   Problem: Skin Integrity: Goal: Risk for impaired skin integrity will decrease Outcome: Completed/Met   Problem: Education: Goal: Knowledge of disease and its progression will improve Outcome: Completed/Met Goal: Individualized Educational Video(s) Outcome: Completed/Met   Problem: Fluid Volume: Goal: Compliance with measures to maintain balanced fluid volume will improve Outcome: Completed/Met   Problem: Nutritional: Goal: Ability to make healthy dietary choices will improve Outcome: Completed/Met   Problem: Clinical Measurements: Goal: Complications related to the disease process, condition or treatment will be avoided or minimized Outcome: Completed/Met

## 2019-07-01 NOTE — Procedures (Signed)
Patient seen on Hemodialysis. BP (!) 191/96   Pulse 83   Temp 98.9 F (37.2 C) (Oral)   Resp 17   Ht 6\' 4"  (1.93 m)   Wt 89.3 kg   SpO2 91%   BMI 23.96 kg/m   QB 400, UF goal 3L Tolerating treatment without complaints at this time.   Elmarie Shiley MD [facility]. Office # [phone] Pager # [phone] 9:36 AM

## 2019-07-01 NOTE — Progress Notes (Signed)
Occupational Therapy Treatment Patient Details Name: Frank Fuller MRN: [id number] DOB: 02-14-1961 Today's Date: 07/01/2019    History of present illness Patient is a 58 year old male with history of hypertension, ESRD on dialysis( Tuesday,Thursday,Saturday), abdominal aortic aneurysm status post stent, hepatitis C virus status post treatment, depression, CVA who initially presented to [facility] with multiple complaints.  He was recently hospitalized from 8/11-8/20 for dissection of thoracic aorta(type B) and was treated with endovascular stent graft with spinal drain by Dr. Oneida Alar on 8/14.  He was discharged home but immediately came back with complaints of urge to use the restroom but unable to do so.  He also had fever.  He was found to have paraplegia.  He was suspected to have thoracic ischemia.  CT imaging also showed possible osteomyelitis/discitis of L4-L5.  Also suspected to have sepsis secondary to possible pneumonia. Underwent disc aspiration by IR on 06/18/2019   OT comments  Pt demonstrates continued lack of progress with therapy toward CIR intensive therapy needs. Changed recommendations to SNF level this session. Attempting to have conversation with patient and patient currently closing eyes and not answering therapist. Pt directly asked multiple times and different ways during session to discuss his goals and wants for therapy going forward with no response provided. Pt with a new behavioral goal and recommendation made to help with progression with therapy.  Recommending prior to next session to have therapist have patient write down the time of session that day and sign the document with clear statements by therapist that the goal of session is to progress OOB. If patient fails to adhere to his own established time then please document.    Follow Up Recommendations  SNF    Equipment Recommendations  Wheelchair cushion (measurements OT);Wheelchair (measurements OT);Hospital  bed    Recommendations for Other Services      Precautions / Restrictions Precautions Precautions: Fall Restrictions Weight Bearing Restrictions: No       Mobility Bed Mobility Overal bed mobility: Needs Assistance   Rolling: +2 for physical assistance;Mod assist   Supine to sit: +2 for physical assistance;Mod assist Sit to supine: +2 for physical assistance;Max assist   General bed mobility comments: pt requires (A) to elevate bil LE on bed surface. pt requires enouragement to static sit at EOB. pt static sitting min (A) .   Transfers Overall transfer level: Needs assistance   Transfers: Lateral/Scoot Transfers          Lateral/Scoot Transfers: +2 physical assistance;Mod assist General transfer comment: pt lateral scooting left toward to [place] Overall balance assessment: Needs assistance Sitting-balance support: Bilateral upper extremity supported;Feet supported Sitting balance-Leahy Scale: Fair                                     ADL either performed or assessed with clinical judgement   ADL Overall ADL's : Needs assistance/impaired   Eating/Feeding Details (indicate cue type and reason): food on tray                  Lower Body Dressing: Total assistance Lower Body Dressing Details (indicate cue type and reason): don socks  Toilet Transfer: +2 for physical assistance;Moderate assistance Toilet Transfer Details (indicate cue type and reason): lateral scoot lateral to the L side           General ADL Comments: pt closing eyes not answering questions, pt immediately responding to  RN. pt demonstrates behavioral needs to help keep sessions progressing.      Vision       Perception     Praxis      Cognition Arousal/Alertness: Awake/alert Behavior During Therapy: Flat affect Overall Cognitive Status: Impaired/Different from baseline Area of Impairment: Following commands;Awareness                        Following Commands: Follows one step commands inconsistently;Follows one step commands with increased time   Awareness: Intellectual   General Comments: pt incontinent and aware but not asking for help. pt insisting that "regular staff' clean him up. pt with eye closed and ignorning staff good portion of session. pt immediately responsive and talking to RN upon his arousal . pt demonstrates behavior that requires redirection and could benefit from a behavorial plan         Exercises     Shoulder Instructions       General Comments incontinence of stool and aware but not requesting (A) . RN staff reporting that patient insist on RN staff for all peri needs    Pertinent Vitals/ Pain       Pain Assessment: Faces Faces Pain Scale: Hurts little more Pain Location: generalized Pain Descriptors / Indicators: Grimacing Pain Intervention(s): Monitored during session;Repositioned  Home Living                                          Prior Functioning/Environment              Frequency  Min 2X/week        Progress Toward Goals  OT Goals(current goals can now be found in the care plan section)  Progress towards OT goals: Not progressing toward goals - comment  Acute Rehab OT Goals Patient Stated Goal: to have regular staff help me OT Goal Formulation: With patient Time For Goal Achievement: 07/15/19 Potential to Achieve Goals: Good ADL Goals Pt Will Perform Grooming: with set-up;with modified independence;sitting Pt Will Perform Upper Body Bathing: with min guard assist;sitting Pt Will Perform Lower Body Bathing: with mod assist;sitting/lateral leans Pt Will Perform Upper Body Dressing: with min guard assist;sitting Pt Will Transfer to Toilet: with mod assist;stand pivot transfer;bedside commode Additional ADL Goal #1: Pt will complete bed mobility with mod A to sit EOB for ADLs and in prep for transfers  Plan Frequency remains appropriate;Discharge  plan needs to be updated    Co-evaluation    PT/OT/SLP Co-Evaluation/Treatment: Yes Reason for Co-Treatment: Complexity of the patient's impairments (multi-system involvement);Necessary to address cognition/behavior during functional activity;For patient/therapist safety;To address functional/ADL transfers   OT goals addressed during session: ADL's and self-care;Proper use of Adaptive equipment and DME;Strengthening/ROM      AM-PAC OT "6 Clicks" Daily Activity     Outcome Measure   Help from another person eating meals?: None Help from another person taking care of personal grooming?: A Little Help from another person toileting, which includes using toliet, bedpan, or urinal?: Total Help from another person bathing (including washing, rinsing, drying)?: A Lot Help from another person to put on and taking off regular upper body clothing?: A Little Help from another person to put on and taking off regular lower body clothing?: Total 6 Click Score: 14    End of Session    OT Visit Diagnosis: Muscle weakness (generalized) (M62.81);Pain;Other abnormalities of gait  and mobility (R26.89)   Activity Tolerance Patient tolerated treatment well   Patient Left in bed;with call bell/phone within reach;Other (comment)(RN Ren in room )   Nurse Communication Mobility status;Precautions        Time: [id number] OT Time Calculation (min): 23 min  Charges: OT Treatments $Self Care/Home Management : 8-22 mins   Jeri Modena, OTR/L  Acute Rehabilitation Services Pager: [phone] Office: [phone] .    Jeri Modena 07/01/2019, 2:46 PM

## 2019-07-01 NOTE — Progress Notes (Signed)
Inpatient Rehabilitation Admissions Coordinator  Patient continues to not be at a level for more intensive therapies in a inpt rehab setting. I do feel this is the best program for him, but he lacks the ability yet to participate in this structured intensive environment. I will follow at a distance for now.  Danne Baxter, RN, MSN Rehab Admissions Coordinator [phone] 07/01/2019 3:47 PM

## 2019-07-01 NOTE — Progress Notes (Signed)
[facility] Progress Note   Dialysis Orders: [facility] 4:15hr,400/1.5,87.5kg,2/2.25, Profile #2, R TDC, no heparin. - Parsabiv 2.5mg  IV q HD  - Calcitriol 1.93mcg PO q HD - home BP Rx > labetalol 300 bid/ amlodipine 10 hs/ hydralazine 100 tid/ losartan 100 qd/ minoxidil 5 bid  Assessment/Plan: 1. Spinal cord infarct/ paraperesis: per spinal MRI 8/23 at T9- T11, after complaints of not being able to move legs. 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 Neurosurgery consulted, no plan for intervention, recommend daily ASA. CIR has evaluated, offered placement - I believe CIR would be more appropriate that SNF for intensive rehab. 2. L4-L5 lumbar discitis: s/p recent aortic stentgraft.BCx on admit negative. Per ID, will need 6 weeks of IV Vanc + Cefepime with HD (until 07/29/2019).  3. Hematochezia:GI consulted.Flex sig 9/1 showedstigmata of recent bleedinganddiffusely ulcerated rectum.EGD with fewnon-bleeding erosions in gastric antrum. Recommended miralax, carafate - try to limit < 2 weeks. Repeat Flex sig 9/4 withhemostatic spray to entire rectum.On Tetracycline + Flagyl as well for possible H.pylori.  4. ESRD: Continue HD on TTS schedule - no heparin.  5. RecentType B aortic dissection (s/p stent graft repair 8/14): Goal SBP per VVS 120-170. Known severe resistant HTN. Trying to avoid BP drops on HD. On ASA 6. Anemia(ESRD + ABLA): S/p5U PRBCs this admit. Continue max doseAranesp 200 mcg IVweekly (last 9/3). S/p PRBC 9/6 (6 units to date) hgb drifting down to 7 today - will give another unit PRBC on HD 7. Secondary hyperparathyroidism -Ca/phos in goal. Continue calcitriol. Parsabiv not on hospital formulary.Binders changed to Fosrenol d/t concern that Auryxia irritating his bowels. 8. HTN/volume: Goal SBP per VVS 120-170.Back on homeamlodipine,partialhydralazineandlabetalol (home losartan/minoxidil remain on  hold). 9. Nutrition: Alb very low, resume supplements.- diet liberalized to regular - have added fluid restriction  10. Recent MSSA bacteremia/AVG infection: S/p AVG removal andAncefcoursecompleted. 11. Depression - offered chaplain services - declined - would benefit by palliative care consult.  Myriam Jacobson, PA-C [facility] Beeper [phone] 07/01/2019,7:49 AM  LOS: 18 days   Subjective:   Seen on HD. Some nausea and usual lower abdominal pain. Consents to transfusion on HD. Willing to work with PT.   Objective Vitals:   07/01/19 0655 07/01/19 0703 07/01/19 0708 07/01/19 0730  BP: (!) 163/82 (!) 174/70 (!) 168/93 (!) 163/75  Pulse: 79 71 76 86  Resp: 15 15 (!) 21 17  Temp: 98.9 F (37.2 C)     TempSrc: Oral     SpO2: 91%     Weight: 89.3 kg     Height:       Physical Exam goal 3 L General: slender somewhat frail Heart: RRR no murmur Lungs: grossly clear Abdomen: soft ND + BS generalized lower abdominal tenderness Extremities: no LE edema Dialysis Access:  Right TDC Qb 400   Additional Objective Labs: Basic Metabolic Panel: Recent Labs  Lab 06/26/19 0726  06/28/19 0518 06/30/19 0642 07/01/19 0711  NA 130*   < > 132* 131* 131*  K 4.2   < > 4.5 4.5 4.9  CL 91*   < > 94* 93* 94*  CO2 25  --  22 22 21*  GLUCOSE 103*   < > 93 97 99  BUN 35*   < > 46* 62* 74*  CREATININE 7.29*   < > 7.98* 10.69* 13.24*  CALCIUM 8.5*  --  8.6* 8.4* 8.6*  PHOS 4.9*  --   --   --  7.5*   < > =  values in this interval not displayed.   Liver Function Tests: Recent Labs  Lab 06/26/19 0726 07/01/19 0711  ALBUMIN 1.9* 1.8*   No results for input(s): LIPASE, AMYLASE in the last 168 hours. CBC: Recent Labs  Lab 06/25/19 0316  06/28/19 0518  06/29/19 0620  06/30/19 0642 07/01/19 0610 07/01/19 0711  WBC 10.6*   < > 7.7  --  6.7  --  5.8 5.5 5.5  NEUTROABS 7.9*  --   --   --   --   --   --   --   --   HGB 8.2*   < > 8.2*   < > 6.0*   < > 7.3* 7.2* 7.0*   HCT 25.3*   < > 25.3*   < > 18.9*   < > 22.0* 22.0* 21.9*  MCV 91.3   < > 89.4  --  89.6  --  88.0 88.7 90.1  PLT 290   < > 258  --  228  --  237 261 230   < > = values in this interval not displayed.   Blood Culture    Component Value Date/Time   SDES WOUND 06/17/2019 1512   SPECREQUEST L4 L5 DISC ASPIRATION 06/17/2019 1512   CULT  06/17/2019 1512    No growth aerobically or anaerobically. Performed at [facility], [place] [address] [place], [place] [address]    REPTSTATUS 06/22/2019 FINAL 06/17/2019 1512    Cardiac Enzymes: No results for input(s): CKTOTAL, CKMB, CKMBINDEX, TROPONINI in the last 168 hours. CBG: No results for input(s): GLUCAP in the last 168 hours. Iron Studies: No results for input(s): IRON, TIBC, TRANSFERRIN, FERRITIN in the last 72 hours. Lab Results  Component Value Date   INR 1.3 (H) 06/17/2019   INR 1.3 (H) 06/05/2019   INR 1.3 (H) 06/03/2019   Studies/Results: No results found. Medications: . sodium chloride    . sodium chloride    . sodium chloride    . ceFEPime (MAXIPIME) IV 2 g (06/28/19 1820)  . vancomycin Stopped (06/28/19 1638)   . amLODipine  10 mg Oral QHS  . aspirin EC  81 mg Oral Daily  . atorvastatin  20 mg Oral q1800  . bismuth subsalicylate  99991111 mg Oral QID  . Chlorhexidine Gluconate Cloth  6 each Topical Q0600  . [START ON 07/03/2019] darbepoetin (ARANESP) injection - DIALYSIS  150 mcg Intravenous Q Thu-HD  . hydrALAZINE  100 mg Oral Q8H  . labetalol  300 mg Oral BID  . lanthanum  1,000 mg Oral TID WC  . metroNIDAZOLE  250 mg Oral QID  . pantoprazole  40 mg Oral BID  . polyethylene glycol  17 g Oral Daily  . sodium chloride flush  3 mL Intravenous Q12H  . Sucralfate Enema (sucralfate 2g/sterile water 43ml enema)  2 g Rectal BID  . tetracycline  500 mg Oral QID

## 2019-07-01 NOTE — Progress Notes (Signed)
PT Cancellation Note  Patient Details Name: Frank Fuller MRN: [id number] DOB: December 08, 1960   Cancelled Treatment:    Reason Eval/Treat Not Completed: Patient at procedure or test/unavailable   Currently in HD;  Will follow up later today as time allows;  Otherwise, will follow up for PT tomorrow;   Thank you,  [name], PT  Acute Rehabilitation Services Pager [phone] Office [phone]     [name] 07/01/2019, 10:23 AM

## 2019-07-01 NOTE — Progress Notes (Signed)
PROGRESS NOTE    Ravindra Baranek  HYQ:[id number] DOB: June 12, 1961 DOA: 06/13/2019 PCP: [facility]   Brief Narrative:   Frank Fuller is a 58 year old male with history of hypertension, ESRD on dialysis (TTS), abdominal aortic aneurysm status post stent, hepatitis C virus status post treatment, depression, CVA who initially presented to [facility] with multiple complaints.  He was recently hospitalized from 8/11-8/20 for dissection of thoracic aorta (type B) and was treated with endovascular stent graft with spinal drain by Dr. Oneida Alar on 8/14.  He was discharged home but immediately came back with complaints of urge to use the restroom but unable to do so.  He also had fever.  He was found to have paraplegia.  He was suspected to have thoracic spinal ischemia.  CT imaging also showed possible osteomyelitis/discitis of L4-L5.  Also suspected to have sepsis secondary to possible pneumonia. Underwent disc aspiration by IR on 06/18/2019.  Neurosurgery, vascular surgery, ID were following.   Hospital course remarkable for multiple  episodes of bloody bowel movement with clots. FOBT positive with acute blood loss anemia. Underwent  EGD and flexible sigmoidoscopy on 06/24/19 with findings of recal ulceration. Waiting for transfer to CIR/SNF.   Assessment & Plan:   Principal Problem:   Lumbar discitis Active Problems:   ESRD on hemodialysis (HCC)   Anemia due to GI blood loss   Ileus (HCC)   Proctitis   H/O endovascular stent graft for abdominal aortic aneurysm   Gastric ulcer   Rectal ulceration   H. pylori infection   Sepsis secondary to discitis/osteomyelitis:  Presented with fever of 102F, leukocytosis.  ESR 138, CRP /procalcitonin was elevated.  CT also showed possible right-sided pneumonia with Iileus and proctitis.  MRI lumbar spine showed new space-occupying material in the lower retroperitoneum and lumbosacral junction. Favored acute discitis and osteomyelitis at L4-L5 with  pervertebral phlegmon. Cultures from [facility] did not show any growth.  Cultures sent here has not shown any growth.   --IR consulted and underwent disc aspiration 8/25; culture negative --ID consulted, recommended 6 weeks IV vancomycin/cefepime w/ HD; plan end date 07/29/2019 --ESR 67 and CRP 5.5 on 9/7 --Plan to check inflammatory markers q2 weeks per ID (Next: 07/14/2019)  Lower GI bleed:  Erosive gastropathy: Rectal ulcers: H. pylori gastritis: Patient had several episodes of bloody bowel movement with clots and also complained of abdominal pain few days ago .  FOBT positive. CT angiogram did not show any active bleeding or any other acute abnormality.  Underwent  EGD and flexible sigmoidoscopy on 9/1 with finding of nonbleeding erosive gastropathy and multiple ulcers in the rectum due to fecal impaction but no bleeding.  Repeat flex sig colonoscopy on 06/27/2019 with findings of continues area nonbleeding ulcerated mucosa in the distal rectum with friable mucosa and old blood, no active bleeding.  Hemostatic spray was deployed throughout the entire rectum. --continue daily MiraLAX, Metamucil --Carafate enema BID for 2 weeks --High-fiber diet --GI started Bismuth 236m PO QID, Flagyl 2543mPO QID, tetracycline 50044mID x 2 weeks --Protonix 36m73m BID x 2 weeks, then transition to once daily --continue oxycodone 10 mg every 4 hours as needed for rectal pain --transfused 1u pRBC w/ HD today --Will need follow-up H. pylori testing 8 weeks following antibiotic treatment to document eradication --GI now on standby, consider repeat flex sig w/ APC if rebleeding occurs and consider colonoscopy outpatient for colon cancer screening  Acute on chronic normocytic anemia:  Secondary to GI blood loss and  ESRD.   --Repeat flexible sigmoidoscopy 9/4 with no active bleeding appreciated, s/p hemostatic spray --Patient received 2 units of PRBCs on 06/23/2019, 2 units on 06/27/2019, 1 unit on  9/6 --Hemoglobin this morning is 6.2-->8.2-->6.0-->7.3-->7.3 --Continue fosrenol 106m PO TID and ARANESP once weekly with HD --Repeat CBC in a.m.  Hypoxia: Resolved On presentation he was  noted to be hypoxic with desaturation on room air. Pneumonia suspected.  CT had shown left pleural effusion, proBNP was elevated.  Continue volume control with dialysis. Last chest x-ray shows left base atelectasis.  Currently on room air  Chest pain/elevated troponin:  Elevated troponin but they are flat.  Most likely secondary to supply demand ischemia due to ESRD. Denied any chest pain today.  EKG did not show any significant ST changes  ESRD on HD TTS Continue HD per nephrology  Thoracic aortic dissection:  Status post endovascular stent placement.  Vascular surgery was following. Continue outpatient follow-up  Hypertension:  --Continue amlodipine 10 mg p.o. daily and labetalol 300 mg p.o. 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 Abnormal spinal cord at T9-T10, T10-11, possible segmental cord infarct.  Neurosurgery,neurology was consulted and they signed off.  No plan for intervention. Recommended daily baby aspirin by neurology.   Patient to follow-up with neurology [facility] on bilateral lower extremities have improved. PT recommending CIR. Rehab coordinator/Social worker aware.    CIR to reevaluate on Monday/Tuesday for transfer if hemoglobin remains stable.   DVT prophylaxis: SCDs Code Status: Full code Family Communication: None Disposition Plan: Continue inpatient, transfuse, ensure receiving Carafate enemas twice daily, CIR following for admission   Consultants:   Nephrology  Infectious disease - Dr. CaMegan Salonsigned  off 8/28; f/u ID clinic outpatient  Vascular surgery - Dr. FiOneida AlarNeurosurgery - Dr. JeArnoldo Moralesigned off 8/26  Neurology - Dr. XuErlinda Hongsigned off 8/26; [facility]  Gastroenterology - Dr. BeTarri Glennsigned off 9/5  Procedures:   IR disc aspiration - 8/25  Flexible sigmoidoscopy 06/24/2019  EGD 06/24/2019  Flexible sigmoidoscopy 06/27/2019  Antimicrobials:   Vancomycin/cefepime with HD with planned end date 07/29/2019.   Subjective: Patient seen and examined at bedside, continues to be emotional and frustrated this afternoon.  Just returned back from hemodialysis.  States rectal pain is improved with increased dose of oxycodone.  No further bleeding.  Did receive 1 unit of PRBCs with dialysis today for hemoglobin of 7.0.  No other complaints or concerns at this time.  Denies headache, no chest pain, no shortness of breath, no cough/congestion, no fever/chills/night sweats, no paresthesias.  No acute events overnight per nurse staff.  Objective: Vitals:   07/01/19 1030 07/01/19 1100 07/01/19 1118 07/01/19 1201  BP: (!) 207/92 (!) 188/104 (!) 187/109 (!) 162/132  Pulse: 91 81 75 (!) 104  Resp:  _0 Temp:   98.4 F (36.9 C) 98.2 F (36.8 C)  TempSrc:   Oral Oral  SpO2:   93% 94%  Weight:   86.5 kg   Height:        Intake/Output Summary (Last 24 hours) at 07/01/2019 1609 Last data filed at 07/01/2019 1300 Gross per 24 hour  Intake 633.95 ml  Output 2500 ml  Net -1866.05 ml   Filed Weights   06/30/19  2125 07/01/19 0655 07/01/19 1118  Weight: 93.2 kg 89.3 kg 86.5 kg    Examination:  General exam: Appears calm and comfortable  Respiratory system: Clear to auscultation. Respiratory effort normal. Cardiovascular system: S1 & S2 heard, RRR. No JVD, murmurs, rubs, gallops or clicks. No pedal edema. Gastrointestinal system: Abdomen is nondistended, soft, mild bilateral lower quadrant tenderness, No organomegaly or masses felt. Normal bowel sounds  heard. Central nervous system: Alert and oriented. No focal neurological deficits. Extremities: Symmetric 5 x 5 power. Skin: No rashes, lesions or ulcers Psychiatry: Judgement and insight appear normal. Mood & affect appropriate.     Data Reviewed: I have personally reviewed following labs and imaging studies  CBC: Recent Labs  Lab 06/25/19 0316  06/28/19 0518  06/29/19 0620 06/29/19 1655 06/30/19 0642 07/01/19 0610 07/01/19 0711  WBC 10.6*   < > 7.7  --  6.7  --  5.8 5.5 5.5  NEUTROABS 7.9*  --   --   --   --   --   --   --   --   HGB 8.2*   < > 8.2*   < > 6.0* 7.3* 7.3* 7.2* 7.0*  HCT 25.3*   < > 25.3*   < > 18.9* 22.3* 22.0* 22.0* 21.9*  MCV 91.3   < > 89.4  --  89.6  --  88.0 88.7 90.1  PLT 290   < > 258  --  228  --  237 261 230   < > = values in this interval not displayed.   Basic Metabolic Panel: Recent Labs  Lab 06/25/19 0316 06/26/19 0726 06/27/19 0957 06/28/19 0518 06/30/19 0642 07/01/19 0711  NA 134* 130* 132* 132* 131* 131*  K 3.8 4.2 4.0 4.5 4.5 4.9  CL 94* 91* 95* 94* 93* 94*  CO2 25 25  --  22 22 21*  GLUCOSE 92 103* 89 93 97 99  BUN 15 35* 30* 46* 62* 74*  CREATININE 4.07* 7.29* 5.90* 7.98* 10.69* 13.24*  CALCIUM 8.8* 8.5*  --  8.6* 8.4* 8.6*  MG  --   --   --   --  1.9  --   PHOS  --  4.9*  --   --   --  7.5*   GFR: Estimated Creatinine Clearance: 7.3 mL/min (A) (by C-G formula based on SCr of 13.24 mg/dL (H)). Liver Function Tests: Recent Labs  Lab 06/26/19 0726 07/01/19 0711  ALBUMIN 1.9* 1.8*   No results for input(s): LIPASE, AMYLASE in the last 168 hours. No results for input(s): AMMONIA in the last 168 hours. Coagulation Profile: No results for input(s): INR, PROTIME in the last 168 hours. Cardiac Enzymes: No results for input(s): CKTOTAL, CKMB, CKMBINDEX, TROPONINI in the last 168 hours. BNP (last 3 results) No results for input(s): PROBNP in the last 8760 hours. HbA1C: No results for input(s): HGBA1C in the last 72  hours. CBG: No results for input(s): GLUCAP in the last 168 hours. Lipid Profile: No results for input(s): CHOL, HDL, LDLCALC, TRIG, CHOLHDL, LDLDIRECT in the last 72 hours. Thyroid Function Tests: No results for input(s): TSH, T4TOTAL, FREET4, T3FREE, THYROIDAB in the last 72 hours. Anemia Panel: No results for input(s): VITAMINB12, FOLATE, FERRITIN, TIBC, IRON, RETICCTPCT in the last 72 hours. Sepsis Labs: No results for input(s): PROCALCITON, LATICACIDVEN in the last 168 hours.  No results found for this or any previous visit (from the past 240 hour(s)).       Radiology Studies: No results  found.      Scheduled Meds:  sodium chloride   Intravenous Once   amLODipine  10 mg Oral QHS   aspirin EC  81 mg Oral Daily   atorvastatin  20 mg Oral [id number]   bismuth subsalicylate  563 mg Oral QID   Chlorhexidine Gluconate Cloth  6 each Topical Q0600   [START ON 07/03/2019] darbepoetin (ARANESP) injection - DIALYSIS  150 mcg Intravenous Q Thu-HD   hydrALAZINE  100 mg Oral Q8H   labetalol  300 mg Oral BID   lanthanum  1,000 mg Oral TID WC   metroNIDAZOLE  250 mg Oral QID   pantoprazole  40 mg Oral BID   polyethylene glycol  17 g Oral Daily   sodium chloride flush  3 mL Intravenous Q12H   Sucralfate Enema (sucralfate 2g/sterile water 90m enema)  2 g Rectal BID   tetracycline  500 mg Oral QID   Continuous Infusions:  sodium chloride     ceFEPime (MAXIPIME) IV 2 g (06/28/19 1820)   vancomycin     vancomycin Stopped (07/01/19 1107)     LOS: 18 days    Time spent: 31 minutes spent on chart review, discussion with nursing staff, consultants, personally reviewing all imaging and labs, updating family and interview/physical exam; more than 50% of that time was spent in counseling and/or coordination of care.    Karanvir Balderston J [place] DO [facility] Pager [phone] If 7PM-7AM, please contact night-coverage [email or web address] Password [email or web address] 07/01/2019, 4:09  PM

## 2019-07-01 NOTE — Progress Notes (Signed)
Physical Therapy Treatment Patient Details Name: Frank Fuller MRN: [id number] DOB: 06/23/1961 Today's Date: 07/01/2019    History of Present Illness Patient is a 58 year old male with history of hypertension, ESRD on dialysis( Tuesday,Thursday,Saturday), abdominal aortic aneurysm status post stent, hepatitis C virus status post treatment, depression, CVA who initially presented to [facility] with multiple complaints.  He was recently hospitalized from 8/11-8/20 for dissection of thoracic aorta(type B) and was treated with endovascular stent graft with spinal drain by Dr. Oneida Alar on 8/14.  He was discharged home but immediately came back with complaints of urge to use the restroom but unable to do so.  He also had fever.  He was found to have paraplegia.  He was suspected to have thoracic ischemia.  CT imaging also showed possible osteomyelitis/discitis of L4-L5.  Also suspected to have sepsis secondary to possible pneumonia. Underwent disc aspiration by IR on 06/18/2019    PT Comments    Continuing work on functional mobility and activity tolerance;  Inconsistent participation and behaviors noted in today's session; Tending to keep eyes closed, at first, he attributed sleepiness to a change in his medications; Mostly mod assist for bed mobility and simulated lateral scoot transfers; Eyes open consistently with EOB activity;  Had to opt to lay back down instead of getting to the chair due to incontinence of stool (PT and OT offered to help clean him up, and he refused, asking for "regular staff" to clean him up;  Ultimately PT and OT assisted him back to supine, in prep for nursing to assist him in cleaning up  I still consider CIR to be Mr. Rehfeldt's best option to regain functional independence; Still, perhaps he would need some sort of behavioral contract to participate while at CIR (noted OT added a behavioral goal acutely)  Follow Up Recommendations  Other (comment);CIR(would like to see  more engagement in therapy to get to CIR)     Equipment Recommendations  Wheelchair (measurements PT);Wheelchair cushion (measurements PT)(to be determined)    Recommendations for Other Services       Precautions / Restrictions Precautions Precautions: Fall Restrictions Weight Bearing Restrictions: No    Mobility  Bed Mobility Overal bed mobility: Needs Assistance Bed Mobility: Rolling;Sidelying to Sit;Sit to Supine Rolling: +2 for physical assistance;Mod assist   Supine to sit: +2 for physical assistance;Mod assist Sit to supine: +2 for physical assistance;Max assist   General bed mobility comments: When cued for technique for rolling, pt states, "I know the drill", and he still required cues to initiate; mod handheld assist to pull to sit; max assist to help LEs back in the bed to lay back down  Transfers Overall transfer level: Needs assistance   Transfers: Lateral/Scoot Transfers          Lateral/Scoot Transfers: +2 physical assistance;Mod assist General transfer comment: pt lateral scooting left toward to [facility]  Ambulation/Gait                 Stairs             Wheelchair Mobility    Modified Rankin (Stroke Patients Only)       Balance Overall balance assessment: Needs assistance Sitting-balance support: Bilateral upper extremity supported;Feet supported Sitting balance-Leahy Scale: Fair Sitting balance - Comments: Poor initially sitting EOB, progressed to [place]; tends to need UE suport  Cognition Arousal/Alertness: Awake/alert Behavior During Therapy: Flat affect Overall Cognitive Status: Impaired/Different from baseline Area of Impairment: Following commands;Awareness                       Following Commands: Follows one step commands inconsistently;Follows one step commands with increased time   Awareness: Intellectual   General Comments: pt incontinent and aware but not  asking for help. pt insisting that "regular staff' clean him up. pt with eye closed and ignorning staff good portion of session. pt immediately responsive and talking to RN upon his arousal . pt demonstrates behavior that requires redirection and could benefit from a behavorial plan       Exercises      General Comments General comments (skin integrity, edema, etc.): incontinence of stool and aware but not requesting (A) . RN staff reporting that patient insist on RN staff for all peri needs      Pertinent Vitals/Pain Pain Assessment: Faces Faces Pain Scale: Hurts little more Pain Location: generalized Pain Descriptors / Indicators: Grimacing Pain Intervention(s): Monitored during session    Home Living                      Prior Function            PT Goals (current goals can now be found in the care plan section) Acute Rehab PT Goals Patient Stated Goal: to have regular staff help me (with cleaning up after BM) PT Goal Formulation: With patient Time For Goal Achievement: 07/15/19 Potential to Achieve Goals: Fair Progress towards PT goals: Not progressing toward goals - comment(Had to lay back down to clean up from BM; didn't get OOB)    Frequency    Min 3X/week      PT Plan Current plan remains appropriate    Co-evaluation PT/OT/SLP Co-Evaluation/Treatment: Yes Reason for Co-Treatment: Complexity of the patient's impairments (multi-system involvement);Necessary to address cognition/behavior during functional activity;For patient/therapist safety;To address functional/ADL transfers PT goals addressed during session: Mobility/safety with mobility OT goals addressed during session: ADL's and self-care;Proper use of Adaptive equipment and DME;Strengthening/ROM      AM-PAC PT "6 Clicks" Mobility   Outcome Measure  Help needed turning from your back to your side while in a flat bed without using bedrails?: A Lot Help needed moving from lying on your back to  sitting on the side of a flat bed without using bedrails?: A Lot Help needed moving to and from a bed to a chair (including a wheelchair)?: A Lot Help needed standing up from a chair using your arms (e.g., wheelchair or bedside chair)?: Total Help needed to walk in hospital room?: Total Help needed climbing 3-5 steps with a railing? : Total 6 Click Score: 9    End of Session   Activity Tolerance: Other (comment)(Showing self-limiting behaviors) Patient left: in bed;with call bell/phone within reach;with nursing/sitter in room Nurse Communication: Mobility status;Need for lift equipment PT Visit Diagnosis: Unsteadiness on feet (R26.81);Muscle weakness (generalized) (M62.81);Other abnormalities of gait and mobility (R26.89);Pain Pain - Right/Left: Left(B LEs) Pain - part of body: Leg;Ankle and joints of foot     Time: 1400-1423 PT Time Calculation (min) (ACUTE ONLY): 23 min  Charges:  $Therapeutic Activity: 8-22 mins                     [name], PT  Acute Rehabilitation Services Pager [phone] Office [phone]    [name] 07/01/2019, 3:46 PM

## 2019-07-01 NOTE — Progress Notes (Signed)
Pharmacy Antibiotic Note  Frank Fuller is a 58 y.o. male admitted on 06/13/2019 with lumbar discitis. Pharmacy has been consulted to dose vancomycin and cefepime.   ID has been consulted and recommends to continue for 6 weeks total therapy ending 07/29/19.   The patient is ESRD-TTS with a pre-HD Vancomycin random today slightly SUBtherapeutic (VR 13 mcg/ml, goal of 15-25 mcg/ml). The patient tolerated a 4.25h session at a BFR of 400 today - estimated post-HD level ~6.9 mcg/ml. A 1g dose was already given post-HD today. Will give an additional 500 mg x 1 this afternoon to boost back up into the higher end of goal range.   Plan: - Extra Vancomycin 500 mg x 1 today - Resume Vancomycin 1g/HD-TTS starting on 9/10 - Continue Cefepime 2g IV on TTS @ 1800 - Monitor CBC, C/S, clinical progress  Height: 6\' 4"  (193 cm) Weight: 190 lb 11.2 oz (86.5 kg) IBW/kg (Calculated) : 86.8  Temp (24hrs), Avg:98.6 F (37 C), Min:98.2 F (36.8 C), Max:99.8 F (37.7 C)  Recent Labs  Lab 06/26/19 0725 06/26/19 0726 06/27/19 0957 06/28/19 0518 06/29/19 0620 06/30/19 0642 07/01/19 0610 07/01/19 0711  WBC  --   --   --  7.7 6.7 5.8 5.5 5.5  CREATININE  --  7.29* 5.90* 7.98*  --  10.69*  --  13.24*  VANCORANDOM 17  --   --   --   --   --  13  --     Estimated Creatinine Clearance: 7.3 mL/min (A) (by C-G formula based on SCr of 13.24 mg/dL (H)).    Allergies  Allergen Reactions  . Oxycodone Nausea Only  . Chlorhexidine Other (See Comments)    Unknown reaction Patch skin test done at dialysis 06/26/17  - staff using clear dressing and alcohol to clean exit site of catheter  . Clonidine Derivatives Other (See Comments)    Dizziness   . Lisinopril Other (See Comments)    unresponsive  . Carvedilol Rash    Antimicrobials this admission: Vanc 8/21 >> (10/6) Cefepime 8/21 >>(10/6) Flagyl 9/3 >> (9/17) for H.pylori TCN 9/3 >> (9/17) for H.pylori  Microbiology results: 8/23 BCx >> ngtd 8/25 IR disc  aspirate >>NGTD  Thank you for allowing pharmacy to be a part of this patient's care.  Alycia Rossetti, PharmD, BCPS Clinical Pharmacist 07/01/2019 4:07 PM   **Pharmacist phone directory can now be found on [email or web address] ([email or web address]).  Listed under [place].

## 2019-07-02 LAB — TYPE AND SCREEN
ABO/RH(D): O POS
Antibody Screen: NEGATIVE
Unit division: 0

## 2019-07-02 LAB — CBC
HCT: 25.9 % — ABNORMAL LOW (ref 39.0–52.0)
Hemoglobin: 8.5 g/dL — ABNORMAL LOW (ref 13.0–17.0)
MCH: 29.6 pg (ref 26.0–34.0)
MCHC: 32.8 g/dL (ref 30.0–36.0)
MCV: 90.2 fL (ref 80.0–100.0)
Platelets: 237 10*3/uL (ref 150–400)
RBC: 2.87 MIL/uL — ABNORMAL LOW (ref 4.22–5.81)
RDW: 15.9 % — ABNORMAL HIGH (ref 11.5–15.5)
WBC: 5 10*3/uL (ref 4.0–10.5)
nRBC: 0 % (ref 0.0–0.2)

## 2019-07-02 LAB — BPAM RBC
Blood Product Expiration Date: 202010102359
ISSUE DATE / TIME: 202009080941
Unit Type and Rh: 5100

## 2019-07-02 MED FILL — Sucralfate Susp 1 GM/10ML: ORAL | Qty: 20 | Status: AC

## 2019-07-02 NOTE — Plan of Care (Signed)
  Problem: Elimination: Goal: Will not experience complications related to bowel motility Outcome: Progressing   Problem: Health Behavior/Discharge Planning: Goal: Ability to manage health-related needs will improve Outcome: Progressing

## 2019-07-02 NOTE — Progress Notes (Signed)
Physical Therapy Treatment Patient Details Name: Frank Fuller MRN: [id number] DOB: Apr 17, 1961 Today's Date: 07/02/2019    History of Present Illness Patient is a 58 year old male with history of hypertension, ESRD on dialysis( Tuesday,Thursday,Saturday), abdominal aortic aneurysm status post stent, hepatitis C virus status post treatment, depression, CVA who initially presented to [facility] with multiple complaints.  He was recently hospitalized from 8/11-8/20 for dissection of thoracic aorta(type B) and was treated with endovascular stent graft with spinal drain by Dr. Oneida Alar on 8/14.  He was discharged home but immediately came back with complaints of urge to use the restroom but unable to do so.  He also had fever.  He was found to have paraplegia.  He was suspected to have thoracic ischemia.  CT imaging also showed possible osteomyelitis/discitis of L4-L5.  Also suspected to have sepsis secondary to possible pneumonia. Underwent disc aspiration by IR on 06/18/2019    PT Comments    Continuing work on functional mobility and activity tolerance;  Good participation after pain medication; Still unstable in unsupported sitting, with dependence on UE support; Very nice lateral scoot OOB to recliner with drop-arm down (no need for sliding board); Continue to recommend comprehensive inpatient rehab (CIR) for post-acute therapy needs.  Recommend Neuropsych follow at CIR.    Follow Up Recommendations  CIR     Equipment Recommendations  Wheelchair (measurements PT);Wheelchair cushion (measurements PT)(to be determined)    Recommendations for Other Services       Precautions / Restrictions Precautions Precautions: Fall    Mobility  Bed Mobility Overal bed mobility: Needs Assistance Bed Mobility: Rolling;Sidelying to Sit Rolling: Min guard Sidelying to sit: Mod assist       General bed mobility comments: Mod handheld assist to pull to sit; some pain grimace, but less tahn  previous sessions  Transfers Overall transfer level: Needs assistance   Transfers: Lateral/Scoot Transfers          Lateral/Scoot Transfers: +2 safety/equipment;Min assist General transfer comment: Performed lateral scoot to L to recliner with armrest down; heavy dependence on UEs for balance and movement; Cues fo rtechnique; Moved quite fast; cuse fo slower, controlled movement  Ambulation/Gait                 Stairs             Wheelchair Mobility    Modified Rankin (Stroke Patients Only)       Balance     Sitting balance-Leahy Scale: Fair                                      Cognition Arousal/Alertness: Awake/alert Behavior During Therapy: WFL for tasks assessed/performed Overall Cognitive Status: Within Functional Limits for tasks assessed(for simple mobility tasks)                                 General Comments: Better participation today      Exercises      General Comments General comments (skin integrity, edema, etc.): Performed chair pushups x8; cues to slow down and cotrol movement      Pertinent Vitals/Pain Pain Assessment: Faces Faces Pain Scale: Hurts a little bit Pain Location: generalized Pain Descriptors / Indicators: Grimacing Pain Intervention(s): Monitored during session    Home Living  Prior Function            PT Goals (current goals can now be found in the care plan section) Acute Rehab PT Goals Patient Stated Goal: Agreeable to getting OOB PT Goal Formulation: With patient Time For Goal Achievement: 07/15/19 Potential to Achieve Goals: Fair Progress towards PT goals: Progressing toward goals    Frequency    Min 3X/week      PT Plan Current plan remains appropriate    Co-evaluation              AM-PAC PT "6 Clicks" Mobility   Outcome Measure  Help needed turning from your back to your side while in a flat bed without using bedrails?:  A Little Help needed moving from lying on your back to sitting on the side of a flat bed without using bedrails?: A Little Help needed moving to and from a bed to a chair (including a wheelchair)?: A Little Help needed standing up from a chair using your arms (e.g., wheelchair or bedside chair)?: Total Help needed to walk in hospital room?: Total Help needed climbing 3-5 steps with a railing? : Total 6 Click Score: 12    End of Session   Activity Tolerance: Patient tolerated treatment well Patient left: in chair;with call bell/phone within reach Nurse Communication: Mobility status PT Visit Diagnosis: Unsteadiness on feet (R26.81);Muscle weakness (generalized) (M62.81);Other abnormalities of gait and mobility (R26.89);Pain Pain - Right/Left: Left(B LEs) Pain - part of body: Leg;Ankle and joints of foot     Time: 1430-1445 PT Time Calculation (min) (ACUTE ONLY): 15 min  Charges:  $Therapeutic Activity: 8-22 mins                     [name], PT  Acute Rehabilitation Services Pager [phone] Office [place] 07/02/2019, 4:27 PM

## 2019-07-02 NOTE — Progress Notes (Signed)
PT Cancellation Note  Patient Details Name: Frank Fuller MRN: [id number] DOB: Mar 31, 1961   Cancelled Treatment:    Reason Eval/Treat Not Completed: Other (comment)   Attempted PT session at 2 pm per plan;  [name] was crying in pain; had just been medicated for pain;   Plan to retry in approx 30 minutes;   [name], [place]  Acute Rehabilitation Services Pager [phone] Office [phone]    [name] 07/02/2019, 2:08 PM

## 2019-07-02 NOTE — Progress Notes (Signed)
[facility]  Subjective  - no change   Objective (!) 152/77 79 99.5 F (37.5 C) (Oral) 16 95%  Intake/Output Summary (Last 24 hours) at 07/02/2019 0900 Last data filed at 07/02/2019 0700 Gross per 24 hour  Intake 832.32 ml  Output 2500 ml  Net -1667.68 ml    Assessment/Planning: Paraplegia s/p dissection repair  Pt seems depressed and frustrated.  Agree he would benefit on both issues with a stay in Rehab.  He says he is talking with PT again later today.  Ruta Hinds 07/02/2019 9:00 AM --  Laboratory Lab Results: Recent Labs    07/01/19 0711 07/02/19 0439  WBC 5.5 5.0  HGB 7.0* 8.5*  HCT 21.9* 25.9*  PLT 230 237   BMET Recent Labs    06/30/19 0642 07/01/19 0711  NA 131* 131*  K 4.5 4.9  CL 93* 94*  CO2 22 21*  GLUCOSE 97 99  BUN 62* 74*  CREATININE 10.69* 13.24*  CALCIUM 8.4* 8.6*    COAG Lab Results  Component Value Date   INR 1.3 (H) 06/17/2019   INR 1.3 (H) 06/05/2019   INR 1.3 (H) 06/03/2019   No results found for: PTT

## 2019-07-02 NOTE — Progress Notes (Signed)
Inpatient Rehabilitation Admissions Coordinator  I met with patient after his therapy session today. We discussed the need to continue aggressive therapy of 3 hrs per day with his continued participation at [place]. He is in agreement. I will follow up tomorrow to proceed with admission.   Danne Baxter, RN, MSN Rehab Admissions Coordinator [phone] 07/02/2019 8:10 PM

## 2019-07-02 NOTE — Progress Notes (Signed)
Physical Therapy   Called in room and briefly discussed plan for PT today with [name];   Plan to work with PT at 2pm today;   He verbalized agreement;   [name], [place] Pager [phone] Office [phone]
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
## 2019-07-02 NOTE — Progress Notes (Addendum)
[facility] Progress Note   Dialysis Orders: [facility] 4:15hr,400/1.5,87.5kg,2/2.25, Profile #2, R TDC, no heparin. - Parsabiv 2.5mg  IV q HD  - Calcitriol 1.9mcg PO q HD - home BP Rx > labetalol 300 bid/ amlodipine 10 hs/ hydralazine 100 tid/ losartan 100 qd/ minoxidil 5 bid  Assessment/Plan: 1. Spinal cord infarct/ paraperesis: per spinal MRI 8/23 at T9- T11, after complaints of not being able to move legs. 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 Neurosurgery consulted, no plan for intervention, recommend daily ASA. CIR has evaluated, offered placement  - limited participation with PT I think related to depression - really needs psychotherapy to deal with mobility deficits. 2. L4-L5 lumbar discitis: s/p recent aortic stentgraft.BCx on admit negative. Per ID, will need 6 weeks of IV Vanc + Cefepime with HD (until 07/29/2019).  3. Hematochezia:GI consulted.Flex sig 9/1 showedstigmata of recent bleedinganddiffusely ulcerated rectum.EGD with fewnon-bleeding erosions in gastric antrum. Recommended miralax, carafate - try to limit < 2 weeks. Repeat Flex sig 9/4 withhemostatic spray to entire rectum.On Tetracycline + Flagyl as well for possible H.pylori. hgb 7 9/8 up to 8.5 after tranfusion 1 unit yesterday. 4. ESRD: Continue HD on TTS schedule - no heparin.  5. RecentType B aortic dissection (s/p stent graft repair 8/14): Goal SBP per [facility] 120-170. Known severe resistant HTN. Trying to avoid BP drops on HD. On ASA 6. Anemia(ESRD + ABLA): S/p7U PRBCs this admit. Continue max doseAranesp 200 mcg IVweekly (last 9/3). Transfuse prn 7. Secondary hyperparathyroidism -Ca/phos in goal. Continue calcitriol. Parsabiv not on hospital formulary.Binders changed to Fosrenol d/t concern that Auryxia irritating his bowels. 8. HTN/volume: Goal SBP per [facility] 120-170.net UF 2.5 L post wt 86.5  9/8       Back on  homeamlodipine,partialhydralazineandlabetalol (prior losartan/minoxidil remain on hold). 9.   Nutrition: Alb very low, resume supplements.- diet liberalized to regular  w/fluid restriction  10. Recent MSSA bacteremia/AVG infection: S/p AVG removal andAncefcoursecompleted. 11. Depression - offered chaplain services - declined - needs psychotherapy and possibly [place], PA-C [facility] Beeper [phone] 07/02/2019,9:06 AM  LOS: 19 days   Subjective:   Seen [place] room. PT arranged for 2 pm today.   Objective Vitals:   07/01/19 1201 07/01/19 1758 07/01/19 2217 07/02/19 0454  BP: (!) 162/132 (!) 159/77 (!) 121/59 (!) 152/77  Pulse: (!) 104 63 (!) 52 79  Resp: 16 18 18 16   Temp: 98.2 F (36.8 C) 100.2 F (37.9 C) 98.9 F (37.2 C) 99.5 F (37.5 C)  TempSrc: Oral Oral Oral Oral  SpO2: 94% 96% 97% 95%  Weight:      Height:       Physical Exam  General: slender somewhat frail Heart: RRR no murmur Lungs: grossly clear Abdomen: soft ND + BS generalized lower abdominal tenderness Extremities: no LE edema Dialysis Access:  Right [facility]    Additional Objective Labs: Basic Metabolic Panel: Recent Labs  Lab 06/26/19 0726  06/28/19 0518 06/30/19 0642 07/01/19 0711  NA 130*   < > 132* 131* 131*  K 4.2   < > 4.5 4.5 4.9  CL 91*   < > 94* 93* 94*  CO2 25  --  22 22 21*  GLUCOSE 103*   < > 93 97 99  BUN 35*   < > 46* 62* 74*  CREATININE 7.29*   < > 7.98* 10.69* 13.24*  CALCIUM 8.5*  --  8.6* 8.4* 8.6*  PHOS 4.9*  --   --   --  7.5*   < > = values in this interval not displayed.   Liver Function Tests: Recent Labs  Lab 06/26/19 0726 07/01/19 0711  ALBUMIN 1.9* 1.8*   No results for input(s): LIPASE, AMYLASE in the last 168 hours. CBC: Recent Labs  Lab 06/29/19 0620  06/30/19 0642 07/01/19 0610 07/01/19 0711 07/02/19 0439  WBC 6.7  --  5.8 5.5 5.5 5.0  HGB 6.0*   < > 7.3* 7.2* 7.0* 8.5*  HCT 18.9*   < > 22.0* 22.0* 21.9*  25.9*  MCV 89.6  --  88.0 88.7 90.1 90.2  PLT 228  --  237 261 230 237   < > = values in this interval not displayed.   Blood Culture    Component Value Date/Time   SDES WOUND 06/17/2019 1512   SPECREQUEST L4 L5 DISC ASPIRATION 06/17/2019 1512   CULT  06/17/2019 1512    No growth aerobically or anaerobically. Performed at [facility], [place] [address], [place], [place] [address]    REPTSTATUS 06/22/2019 FINAL 06/17/2019 1512    Cardiac Enzymes: No results for input(s): CKTOTAL, CKMB, CKMBINDEX, TROPONINI in the last 168 hours. CBG: No results for input(s): GLUCAP in the last 168 hours. Iron Studies: No results for input(s): IRON, TIBC, TRANSFERRIN, FERRITIN in the last 72 hours. Lab Results  Component Value Date   INR 1.3 (H) 06/17/2019   INR 1.3 (H) 06/05/2019   INR 1.3 (H) 06/03/2019   Studies/Results: No results found. Medications: . sodium chloride 250 mL (07/01/19 1637)  . ceFEPime (MAXIPIME) IV 2 g (07/01/19 1939)  . vancomycin Stopped (07/01/19 1107)   . sodium chloride   Intravenous Once  . amLODipine  10 mg Oral QHS  . aspirin EC  81 mg Oral Daily  . atorvastatin  20 mg Oral q1800  . bismuth subsalicylate  99991111 mg Oral QID  . Chlorhexidine Gluconate Cloth  6 each Topical Q0600  . [START ON 07/03/2019] darbepoetin (ARANESP) injection - DIALYSIS  150 mcg Intravenous Q Thu-HD  . hydrALAZINE  100 mg Oral Q8H  . labetalol  300 mg Oral BID  . lanthanum  1,000 mg Oral TID WC  . metroNIDAZOLE  250 mg Oral QID  . pantoprazole  40 mg Oral BID  . polyethylene glycol  17 g Oral Daily  . sodium chloride flush  3 mL Intravenous Q12H  . Sucralfate Enema (sucralfate 2g/sterile water 45ml enema)  2 g Rectal BID  . tetracycline  500 mg Oral QID

## 2019-07-02 NOTE — Progress Notes (Signed)
PROGRESS NOTE    Frank Fuller  JME:[id number] DOB: Jun 29, 1961 DOA: 06/13/2019 PCP: [facility]   Brief Narrative: 58 year old with past medical history significant for hypertension, end-stage renal disease on hemodialysis Tuesday Thursday and Saturday, abdominal aortic aneurysm status post a stent, hepatitis C virus status post treatment, depression, CVA who initially presented to [facility] with multiple complaints.  He was recently hospitalized from 8/11 until 8/20 for dissection of thoracic aorta type B and was treated with endovascular stent graft with a spinal drain by Dr. Era Bumpers on 8/14.  He was discharged home but immediately came back with complaints of urge  to use the restroom but unable to do so.  He also had fever.  He was found to have paraplegia.  He was suspected to have thoracic spinal ischemia.  CT imaging also showed possible osteomyelitis and discitis of L4-L5.  Also suspected to have sepsis secondary to possible pneumonia.  Underwent disc aspiration by IR on 06/18/2019.  Neurosurgery and vascular surgery and ID were following.  Hospital course remarkable for multiple episodes of bloody bowel movement with clots.  FOBT positive with acute blood loss anemia.  Underwent endoscopy and flexible sigmoidoscopy on 06/24/2019 with finding of rectal ulceration. Currently patient is awaiting disposition CIR versus a skilled nursing facility.   Assessment & Plan:   Principal Problem:   Lumbar discitis Active Problems:   ESRD on hemodialysis (HCC)   Anemia due to GI blood loss   Ileus (HCC)   Proctitis   H/O endovascular stent graft for abdominal aortic aneurysm   Gastric ulcer   Rectal ulceration   H. pylori infection   1-secondary to discitis/osteomyelitis: Patient presented with fever, leukocytosis, ESR 138, CRP procalcitonin was elevated.  CT also showed possible right-sided pneumonia with proctitis.  MRI of the lumbar spine show numerous space-occupying material in  the lower retroperitoneum and lumbar sacral junction.  Favor acute discitis and osteomyelitis at L4-L5 with prevertebral phlegmon.  Cultures from [facility] did not show any growth.  Culture sent here has not shown growth of any bacteria. -IR consulted and underwent disc aspiration on 8/25, culture negative. -ID consulted recommendation for 6 weeks of IV vancomycin and cefepime with hemodialysis, end date on 07/29/2019 -ESR 67 and CRP 5.5 -Need inflammatory marker to be check every 2 weeks next on 9/21.  2-lower GI bleed, erosive gastropathy, rectal ulcers, H. pylori gastritis: Patient has several episode of bloody bowel movement with clots and also complaining of abdominal pain.  FOBT was positive.  CT angiogram did not show any active bleeding. -Underwent endoscopy and flexible sigmoidoscopy on 9/1 with finding of nonbleeding erosive gastropathy and multiple or ulcers in the rectum due to fecal impaction but no bleeding.  Clayborn Heron was deployed throughout the entire rectum. -Continue with MiraLAX and Metamucil -On Carafate edema twice daily for 2 weeks. -Bismuth Flagyl and tetracycline for 2 weeks for H. pylori treatment On PPI twice daily for 2 weeks then transition to once daily. Continue with oxycodone for rectal pain as needed Status post 1 unit blood transfusion with dialysis He will need H. pylori testing in 8 weeks following antibiotic treatment to document eradication Per GI consider repeat flexible sigmoidoscopy with APC if rebleeding occurs and consider colonoscopy outpatient for colorectal cancer screening   Acute on chronic anemia and acute blood loss anemia As above. Patient has received a total of 5 units of packed red blood cell. On Aranesp.  Acute hypoxic respiratory failure: Resolved Treated for pneumonia.  Volume  management during dialysis.  Chest pain elevated troponin: Chest pain-free flat elevation of troponin. Likely supply demand ischemia.  End-stage renal  disease on hemodialysis: Nephrology following. Hypertension: Continue with Norvasc and labetalol and hydralazine.  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 Abnormal spinal cord at T8-9 T10 T10-T11 possible segmental cord infarct Neurosurgery neurology was consulted and they signed off.  No plan for intervention. Recommended daily baby aspirin by neurology.  Patient to follow-up with neurology Dr. [name] in 4 weeks. Strength of bilateral lower extremity have improved slightly. Awaiting CRR evaluation           Estimated body mass index is 23.21 kg/m as calculated from the following:   Height as of this encounter: '6\' 4"'  (1.93 m).   Weight as of this encounter: 86.5 kg.   DVT prophylaxis: SCDs due to GI bleed Code Status: Full code Family Communication: Care discussed with patient Disposition Plan: Awaiting CIR evaluation Consultants:   Nephrology  Neurology  Neurosurgery  Procedures:   IR disc aspiration - 8/25  Flexible sigmoidoscopy 06/24/2019  EGD 06/24/2019  Flexible sigmoidoscopy 06/27/2019   Antimicrobials:   Vancomycin/cefepime with HD with planned end date 07/29/2019.   Subjective: He has been able to move his slightly lower extremity.  He is willing to work with physical therapist.  Denies further GI bleed.  Objective: Vitals:   07/01/19 1758 07/01/19 2217 07/02/19 0454 07/02/19 0937  BP: (!) 159/77 (!) 121/59 (!) 152/77 (!) 166/93  Pulse: 63 (!) 52 79 82  Resp: '18 18 16 16  ' Temp: 100.2 F (37.9 C) 98.9 F (37.2 C) 99.5 F (37.5 C) 98.9 F (37.2 C)  TempSrc: Oral Oral Oral Oral  SpO2: 96% 97% 95% 95%  Weight:      Height:        Intake/Output Summary (Last 24 hours) at 07/02/2019 1605 Last data filed at 07/02/2019 0933 Gross per 24 hour  Intake 438.37 ml  Output 1 ml  Net 437.37 ml     Filed Weights   06/30/19 2125 07/01/19 0655 07/01/19 1118  Weight: 93.2 kg 89.3 kg 86.5 kg    Examination:  General exam: Appears calm and comfortable  Respiratory system: Clear to auscultation. Respiratory effort normal. Cardiovascular system: S1 & S2 heard, RRR. No JVD, murmurs, rubs, gallops or clicks. No pedal edema. Gastrointestinal system: Abdomen is nondistended, soft and nontender. No organomegaly or masses felt. Normal bowel sounds heard. Central nervous system: Alert and oriented. Extremities: Symmetric 5 x 5 power. Skin: No rashes, lesions or ulcers    Data Reviewed: I have personally reviewed following labs and imaging studies  CBC: Recent Labs  Lab 06/29/19 0620 06/29/19 1655 06/30/19 0642 07/01/19 0610 07/01/19 0711 07/02/19 0439  WBC 6.7  --  5.8 5.5 5.5 5.0  HGB 6.0* 7.3* 7.3* 7.2* 7.0* 8.5*  HCT 18.9* 22.3* 22.0* 22.0* 21.9* 25.9*  MCV 89.6  --  88.0 88.7 90.1 90.2  PLT 228  --  237 261 230 856   Basic Metabolic Panel: Recent Labs  Lab 06/26/19 0726 06/27/19 0957 06/28/19 0518 06/30/19 0642 07/01/19 0711  NA 130* 132* 132* 131* 131*  K 4.2 4.0 4.5 4.5 4.9  CL 91* 95* 94* 93* 94*  CO2 25  --  22 22 21*  GLUCOSE 103* 89 93 97 99  BUN 35* 30* 46* 62* 74*  CREATININE 7.29* 5.90* 7.98* 10.69*  13.24*  CALCIUM 8.5*  --  8.6* 8.4* 8.6*  MG  --   --   --  1.9  --   PHOS 4.9*  --   --   --  7.5*   GFR: Estimated Creatinine Clearance: 7.3 mL/min (A) (by C-G formula based on SCr of 13.24 mg/dL (H)). Liver Function Tests: Recent Labs  Lab 06/26/19 0726 07/01/19 0711  ALBUMIN 1.9* 1.8*   No results for input(s): LIPASE, AMYLASE in the last 168 hours. No results for input(s): AMMONIA in the last 168 hours. Coagulation Profile: No results for input(s): INR, PROTIME in the last 168 hours. Cardiac Enzymes: No results for input(s): CKTOTAL, CKMB, CKMBINDEX, TROPONINI in the last 168 hours. BNP (last 3 results) No results for input(s): PROBNP in  the last 8760 hours. HbA1C: No results for input(s): HGBA1C in the last 72 hours. CBG: No results for input(s): GLUCAP in the last 168 hours. Lipid Profile: No results for input(s): CHOL, HDL, LDLCALC, TRIG, CHOLHDL, LDLDIRECT in the last 72 hours. Thyroid Function Tests: No results for input(s): TSH, T4TOTAL, FREET4, T3FREE, THYROIDAB in the last 72 hours. Anemia Panel: No results for input(s): VITAMINB12, FOLATE, FERRITIN, TIBC, IRON, RETICCTPCT in the last 72 hours. Sepsis Labs: No results for input(s): PROCALCITON, LATICACIDVEN in the last 168 hours.  No results found for this or any previous visit (from the past 240 hour(s)).       Radiology Studies: No results found.      Scheduled Meds:  sodium chloride   Intravenous Once   amLODipine  10 mg Oral QHS   aspirin EC  81 mg Oral Daily   atorvastatin  20 mg Oral [id number]   bismuth subsalicylate  384 mg Oral QID   [START ON 07/03/2019] darbepoetin (ARANESP) injection - DIALYSIS  150 mcg Intravenous Q Thu-HD   hydrALAZINE  100 mg Oral Q8H   labetalol  300 mg Oral BID   lanthanum  1,000 mg Oral TID WC   metroNIDAZOLE  250 mg Oral QID   pantoprazole  40 mg Oral BID   polyethylene glycol  17 g Oral Daily   sodium chloride flush  3 mL Intravenous Q12H   Sucralfate Enema (sucralfate 2g/sterile water 3m enema)  2 g Rectal BID   tetracycline  500 mg Oral QID   Continuous Infusions:  sodium chloride 250 mL (07/01/19 1637)   ceFEPime (MAXIPIME) IV 2 g (07/01/19 1939)   vancomycin Stopped (07/01/19 1107)     LOS: 19 days    Time spent: 35 minutes.     BElmarie Shiley MD [facility] Pager [phone] If 7PM-7AM, please contact night-coverage [email or web address] Password [email or web address] 07/02/2019, 4:05 PM

## 2019-07-03 ENCOUNTER — Inpatient Hospital Stay (HOSPITAL_COMMUNITY)
Admission: RE | Admit: 2019-07-03 | Discharge: 2019-07-04 | DRG: 052 | Disposition: A | Discharge status: Home Health | Payer: Medicare Other | Source: Intra-hospital | Attending: Physical Medicine and Rehabilitation | Admitting: Physical Medicine and Rehabilitation

## 2019-07-03 ENCOUNTER — Other Ambulatory Visit: Payer: Self-pay

## 2019-07-03 ENCOUNTER — Encounter (HOSPITAL_COMMUNITY): Payer: Self-pay

## 2019-07-03 DIAGNOSIS — Z818 Family history of other mental and behavioral disorders: Secondary | ICD-10-CM

## 2019-07-03 DIAGNOSIS — E8809 Other disorders of plasma-protein metabolism, not elsewhere classified: Secondary | ICD-10-CM | POA: Diagnosis not present

## 2019-07-03 DIAGNOSIS — D649 Anemia, unspecified: Secondary | ICD-10-CM | POA: Diagnosis not present

## 2019-07-03 DIAGNOSIS — G629 Polyneuropathy, unspecified: Secondary | ICD-10-CM | POA: Diagnosis not present

## 2019-07-03 DIAGNOSIS — Z8619 Personal history of other infectious and parasitic diseases: Secondary | ICD-10-CM

## 2019-07-03 DIAGNOSIS — Z8249 Family history of ischemic heart disease and other diseases of the circulatory system: Secondary | ICD-10-CM

## 2019-07-03 DIAGNOSIS — N186 End stage renal disease: Secondary | ICD-10-CM | POA: Diagnosis present

## 2019-07-03 DIAGNOSIS — B9681 Helicobacter pylori [H. pylori] as the cause of diseases classified elsewhere: Secondary | ICD-10-CM | POA: Diagnosis not present

## 2019-07-03 DIAGNOSIS — D62 Acute posthemorrhagic anemia: Secondary | ICD-10-CM | POA: Diagnosis present

## 2019-07-03 DIAGNOSIS — D638 Anemia in other chronic diseases classified elsewhere: Secondary | ICD-10-CM | POA: Diagnosis not present

## 2019-07-03 DIAGNOSIS — Z992 Dependence on renal dialysis: Secondary | ICD-10-CM

## 2019-07-03 DIAGNOSIS — Z5329 Procedure and treatment not carried out because of patient's decision for other reasons: Secondary | ICD-10-CM | POA: Diagnosis not present

## 2019-07-03 DIAGNOSIS — Z20828 Contact with and (suspected) exposure to other viral communicable diseases: Secondary | ICD-10-CM | POA: Diagnosis present

## 2019-07-03 DIAGNOSIS — K592 Neurogenic bowel, not elsewhere classified: Secondary | ICD-10-CM | POA: Diagnosis present

## 2019-07-03 DIAGNOSIS — R159 Full incontinence of feces: Secondary | ICD-10-CM | POA: Diagnosis present

## 2019-07-03 DIAGNOSIS — I120 Hypertensive chronic kidney disease with stage 5 chronic kidney disease or end stage renal disease: Secondary | ICD-10-CM | POA: Diagnosis present

## 2019-07-03 DIAGNOSIS — G8220 Paraplegia, unspecified: Secondary | ICD-10-CM | POA: Diagnosis present

## 2019-07-03 DIAGNOSIS — F329 Major depressive disorder, single episode, unspecified: Secondary | ICD-10-CM | POA: Diagnosis present

## 2019-07-03 DIAGNOSIS — D631 Anemia in chronic kidney disease: Secondary | ICD-10-CM | POA: Diagnosis present

## 2019-07-03 DIAGNOSIS — E1165 Type 2 diabetes mellitus with hyperglycemia: Secondary | ICD-10-CM | POA: Diagnosis not present

## 2019-07-03 DIAGNOSIS — M4626 Osteomyelitis of vertebra, lumbar region: Secondary | ICD-10-CM | POA: Diagnosis present

## 2019-07-03 DIAGNOSIS — I441 Atrioventricular block, second degree: Secondary | ICD-10-CM | POA: Diagnosis present

## 2019-07-03 DIAGNOSIS — Z823 Family history of stroke: Secondary | ICD-10-CM

## 2019-07-03 DIAGNOSIS — I700 Atherosclerosis of aorta: Secondary | ICD-10-CM | POA: Diagnosis not present

## 2019-07-03 DIAGNOSIS — R34 Anuria and oliguria: Secondary | ICD-10-CM | POA: Diagnosis present

## 2019-07-03 DIAGNOSIS — K625 Hemorrhage of anus and rectum: Secondary | ICD-10-CM | POA: Diagnosis not present

## 2019-07-03 DIAGNOSIS — N319 Neuromuscular dysfunction of bladder, unspecified: Secondary | ICD-10-CM | POA: Diagnosis present

## 2019-07-03 DIAGNOSIS — M7989 Other specified soft tissue disorders: Secondary | ICD-10-CM | POA: Diagnosis not present

## 2019-07-03 DIAGNOSIS — I169 Hypertensive crisis, unspecified: Secondary | ICD-10-CM | POA: Diagnosis not present

## 2019-07-03 DIAGNOSIS — F1290 Cannabis use, unspecified, uncomplicated: Secondary | ICD-10-CM | POA: Diagnosis present

## 2019-07-03 DIAGNOSIS — K626 Ulcer of anus and rectum: Secondary | ICD-10-CM

## 2019-07-03 DIAGNOSIS — Z95828 Presence of other vascular implants and grafts: Secondary | ICD-10-CM

## 2019-07-03 DIAGNOSIS — E785 Hyperlipidemia, unspecified: Secondary | ICD-10-CM | POA: Diagnosis present

## 2019-07-03 DIAGNOSIS — M2142 Flat foot [pes planus] (acquired), left foot: Secondary | ICD-10-CM | POA: Diagnosis present

## 2019-07-03 DIAGNOSIS — K6289 Other specified diseases of anus and rectum: Secondary | ICD-10-CM | POA: Diagnosis not present

## 2019-07-03 DIAGNOSIS — I7101 Dissection of thoracic aorta: Secondary | ICD-10-CM

## 2019-07-03 DIAGNOSIS — B370 Candidal stomatitis: Secondary | ICD-10-CM | POA: Diagnosis not present

## 2019-07-03 DIAGNOSIS — Z79899 Other long term (current) drug therapy: Secondary | ICD-10-CM | POA: Diagnosis not present

## 2019-07-03 DIAGNOSIS — Z888 Allergy status to other drugs, medicaments and biological substances status: Secondary | ICD-10-CM

## 2019-07-03 DIAGNOSIS — H409 Unspecified glaucoma: Secondary | ICD-10-CM | POA: Diagnosis present

## 2019-07-03 DIAGNOSIS — F17210 Nicotine dependence, cigarettes, uncomplicated: Secondary | ICD-10-CM | POA: Diagnosis present

## 2019-07-03 DIAGNOSIS — K259 Gastric ulcer, unspecified as acute or chronic, without hemorrhage or perforation: Secondary | ICD-10-CM | POA: Diagnosis present

## 2019-07-03 DIAGNOSIS — Z8673 Personal history of transient ischemic attack (TIA), and cerebral infarction without residual deficits: Secondary | ICD-10-CM | POA: Diagnosis not present

## 2019-07-03 DIAGNOSIS — Z7982 Long term (current) use of aspirin: Secondary | ICD-10-CM | POA: Diagnosis not present

## 2019-07-03 DIAGNOSIS — M2141 Flat foot [pes planus] (acquired), right foot: Secondary | ICD-10-CM | POA: Diagnosis present

## 2019-07-03 DIAGNOSIS — M4802 Spinal stenosis, cervical region: Secondary | ICD-10-CM | POA: Diagnosis not present

## 2019-07-03 DIAGNOSIS — D696 Thrombocytopenia, unspecified: Secondary | ICD-10-CM | POA: Diagnosis not present

## 2019-07-03 DIAGNOSIS — N2581 Secondary hyperparathyroidism of renal origin: Secondary | ICD-10-CM | POA: Diagnosis present

## 2019-07-03 DIAGNOSIS — G9511 Acute infarction of spinal cord (embolic) (nonembolic): Secondary | ICD-10-CM

## 2019-07-03 DIAGNOSIS — M4714 Other spondylosis with myelopathy, thoracic region: Secondary | ICD-10-CM | POA: Diagnosis not present

## 2019-07-03 DIAGNOSIS — Z8679 Personal history of other diseases of the circulatory system: Secondary | ICD-10-CM | POA: Diagnosis not present

## 2019-07-03 DIAGNOSIS — M4646 Discitis, unspecified, lumbar region: Secondary | ICD-10-CM | POA: Diagnosis present

## 2019-07-03 DIAGNOSIS — I714 Abdominal aortic aneurysm, without rupture: Secondary | ICD-10-CM | POA: Diagnosis not present

## 2019-07-03 DIAGNOSIS — R7881 Bacteremia: Secondary | ICD-10-CM | POA: Diagnosis not present

## 2019-07-03 DIAGNOSIS — K5900 Constipation, unspecified: Secondary | ICD-10-CM

## 2019-07-03 DIAGNOSIS — E1169 Type 2 diabetes mellitus with other specified complication: Secondary | ICD-10-CM | POA: Diagnosis present

## 2019-07-03 DIAGNOSIS — Z885 Allergy status to narcotic agent status: Secondary | ICD-10-CM

## 2019-07-03 DIAGNOSIS — K449 Diaphragmatic hernia without obstruction or gangrene: Secondary | ICD-10-CM | POA: Diagnosis not present

## 2019-07-03 DIAGNOSIS — G9520 Unspecified cord compression: Secondary | ICD-10-CM | POA: Diagnosis not present

## 2019-07-03 DIAGNOSIS — Z716 Tobacco abuse counseling: Secondary | ICD-10-CM

## 2019-07-03 DIAGNOSIS — E1122 Type 2 diabetes mellitus with diabetic chronic kidney disease: Secondary | ICD-10-CM | POA: Diagnosis present

## 2019-07-03 DIAGNOSIS — B1920 Unspecified viral hepatitis C without hepatic coma: Secondary | ICD-10-CM | POA: Diagnosis not present

## 2019-07-03 LAB — CBC
HCT: 24.9 % — ABNORMAL LOW (ref 39.0–52.0)
Hemoglobin: 8 g/dL — ABNORMAL LOW (ref 13.0–17.0)
MCH: 29.6 pg (ref 26.0–34.0)
MCHC: 32.1 g/dL (ref 30.0–36.0)
MCV: 92.2 fL (ref 80.0–100.0)
Platelets: 223 10*3/uL (ref 150–400)
RBC: 2.7 MIL/uL — ABNORMAL LOW (ref 4.22–5.81)
RDW: 16 % — ABNORMAL HIGH (ref 11.5–15.5)
WBC: 5.1 10*3/uL (ref 4.0–10.5)
nRBC: 0 % (ref 0.0–0.2)

## 2019-07-03 LAB — RENAL FUNCTION PANEL
Albumin: 1.9 g/dL — ABNORMAL LOW (ref 3.5–5.0)
Anion gap: 16 — ABNORMAL HIGH (ref 5–15)
BUN: 45 mg/dL — ABNORMAL HIGH (ref 6–20)
CO2: 22 mmol/L (ref 22–32)
Calcium: 8.8 mg/dL — ABNORMAL LOW (ref 8.9–10.3)
Chloride: 93 mmol/L — ABNORMAL LOW (ref 98–111)
Creatinine, Ser: 10.17 mg/dL — ABNORMAL HIGH (ref 0.61–1.24)
GFR calc Af Amer: 6 mL/min — ABNORMAL LOW (ref >60)
GFR calc non Af Amer: 5 mL/min — ABNORMAL LOW (ref >60)
Glucose, Bld: 88 mg/dL (ref 70–99)
Phosphorus: 6.7 mg/dL — ABNORMAL HIGH (ref 2.5–4.6)
Potassium: 4.4 mmol/L (ref 3.5–5.1)
Sodium: 131 mmol/L — ABNORMAL LOW (ref 135–145)

## 2019-07-03 MED ORDER — HEPARIN SODIUM (PORCINE) 1000 UNIT/ML DIALYSIS
1000.0000 [IU] | INTRAMUSCULAR | Status: DC | PRN
  Administered 2019-07-03: 1000 [IU] via INTRAVENOUS_CENTRAL

## 2019-07-03 MED ORDER — ACETAMINOPHEN 325 MG PO TABS
650.0000 mg | ORAL_TABLET | Freq: Four times a day (QID) | ORAL | Status: DC | PRN
  Administered 2019-07-03: 650 mg via ORAL
  Filled 2019-07-03: qty 2

## 2019-07-03 MED ORDER — ACETAMINOPHEN 650 MG RE SUPP: 650.0000 mg | Freq: Four times a day (QID) | RECTAL | Status: DC | PRN

## 2019-07-03 MED ORDER — LANTHANUM CARBONATE 500 MG PO CHEW
1000.0000 mg | CHEWABLE_TABLET | Freq: Three times a day (TID) | ORAL | Status: DC
  Administered 2019-07-03: 18:00:00 1000 mg via ORAL
  Filled 2019-07-03 (×3): qty 2

## 2019-07-03 MED ORDER — HYDROCODONE-ACETAMINOPHEN 7.5-325 MG PO TABS
1.0000 | ORAL_TABLET | Freq: Four times a day (QID) | ORAL | Status: DC | PRN
  Administered 2019-07-04: 1 via ORAL
  Filled 2019-07-03: qty 1

## 2019-07-03 MED ORDER — OXYCODONE HCL 5 MG PO TABS: 10.0000 mg | ORAL_TABLET | ORAL | Status: DC | PRN

## 2019-07-03 MED ORDER — TETRACYCLINE HCL 250 MG PO CAPS
500.0000 mg | ORAL_CAPSULE | Freq: Four times a day (QID) | ORAL | Status: DC
  Administered 2019-07-03 (×2): 500 mg via ORAL
  Filled 2019-07-03 (×4): qty 2

## 2019-07-03 MED ORDER — TRAMADOL HCL 50 MG PO TABS: 50.0000 mg | ORAL_TABLET | Freq: Four times a day (QID) | ORAL | Status: DC | PRN

## 2019-07-03 MED ORDER — SODIUM CHLORIDE 0.9 % IV SOLN
2.0000 g | INTRAVENOUS | Status: DC
  Administered 2019-07-03: 19:00:00 2 g via INTRAVENOUS
  Filled 2019-07-03: qty 2

## 2019-07-03 MED ORDER — HEPARIN SODIUM (PORCINE) 1000 UNIT/ML DIALYSIS
1000.0000 [IU] | INTRAMUSCULAR | Status: DC | PRN
  Filled 2019-07-03: qty 1

## 2019-07-03 MED ORDER — SODIUM CHLORIDE 0.9 % IV SOLN: 100.0000 mL | INTRAVENOUS | Status: DC | PRN

## 2019-07-03 MED ORDER — PANTOPRAZOLE SODIUM 40 MG PO TBEC
40.0000 mg | DELAYED_RELEASE_TABLET | Freq: Two times a day (BID) | ORAL | Status: DC
  Administered 2019-07-03: 22:00:00 40 mg via ORAL
  Filled 2019-07-03: qty 1

## 2019-07-03 MED ORDER — LABETALOL HCL 300 MG PO TABS: 300.0000 mg | ORAL_TABLET | Freq: Two times a day (BID) | ORAL | 0 refills | Status: DC

## 2019-07-03 MED ORDER — LIDOCAINE-PRILOCAINE 2.5-2.5 % EX CREA: 1.0000 "application " | TOPICAL_CREAM | CUTANEOUS | Status: DC | PRN

## 2019-07-03 MED ORDER — HYDRALAZINE HCL 50 MG PO TABS
100.0000 mg | ORAL_TABLET | Freq: Three times a day (TID) | ORAL | Status: DC
  Administered 2019-07-03: 22:00:00 100 mg via ORAL
  Filled 2019-07-03: qty 2

## 2019-07-03 MED ORDER — HEPARIN SODIUM (PORCINE) 1000 UNIT/ML IJ SOLN
INTRAMUSCULAR | Status: AC
  Filled 2019-07-03: qty 4

## 2019-07-03 MED ORDER — VANCOMYCIN HCL IN DEXTROSE 1-5 GM/200ML-% IV SOLN
INTRAVENOUS | Status: AC
  Filled 2019-07-03: qty 200

## 2019-07-03 MED ORDER — NONFORMULARY OR COMPOUNDED ITEM
2.0000 g | Freq: Two times a day (BID) | Status: DC
  Administered 2019-07-04: 03:00:00 2 g via RECTAL
  Filled 2019-07-03 (×2): qty 1

## 2019-07-03 MED ORDER — ASPIRIN EC 81 MG PO TBEC
81.0000 mg | DELAYED_RELEASE_TABLET | Freq: Every day | ORAL | Status: DC
  Administered 2019-07-03: 22:00:00 81 mg via ORAL

## 2019-07-03 MED ORDER — ALTEPLASE 2 MG IJ SOLR: 2.0000 mg | Freq: Once | INTRAMUSCULAR | Status: DC | PRN

## 2019-07-03 MED ORDER — SORBITOL 70 % SOLN: 30.0000 mL | Freq: Every day | Status: DC | PRN

## 2019-07-03 MED ORDER — BISMUTH SUBSALICYLATE 262 MG PO CHEW
262.0000 mg | CHEWABLE_TABLET | Freq: Four times a day (QID) | ORAL | Status: DC
  Administered 2019-07-03 (×2): 262 mg via ORAL
  Filled 2019-07-03 (×4): qty 1

## 2019-07-03 MED ORDER — POLYETHYLENE GLYCOL 3350 17 G PO PACK
17.0000 g | PACK | Freq: Every day | ORAL | Status: DC
  Administered 2019-07-03: 22:00:00 17 g via ORAL

## 2019-07-03 MED ORDER — METRONIDAZOLE 500 MG PO TABS
250.0000 mg | ORAL_TABLET | Freq: Four times a day (QID) | ORAL | Status: DC
  Administered 2019-07-03: 22:00:00 250 mg via ORAL
  Filled 2019-07-03 (×2): qty 0.5
  Filled 2019-07-03 (×3): qty 1

## 2019-07-03 MED ORDER — DARBEPOETIN ALFA 150 MCG/0.3ML IJ SOSY
PREFILLED_SYRINGE | INTRAMUSCULAR | Status: AC
  Filled 2019-07-03: qty 0.3

## 2019-07-03 MED ORDER — NITROGLYCERIN 0.4 MG SL SUBL: 0.4000 mg | SUBLINGUAL_TABLET | SUBLINGUAL | Status: DC | PRN

## 2019-07-03 MED ORDER — AMLODIPINE BESYLATE 10 MG PO TABS
10.0000 mg | ORAL_TABLET | Freq: Every day | ORAL | Status: DC
  Administered 2019-07-03: 10 mg via ORAL
  Filled 2019-07-03: qty 1

## 2019-07-03 MED ORDER — PENTAFLUOROPROP-TETRAFLUOROETH EX AERO: 1.0000 "application " | INHALATION_SPRAY | CUTANEOUS | Status: DC | PRN

## 2019-07-03 MED ORDER — VANCOMYCIN HCL IN DEXTROSE 1-5 GM/200ML-% IV SOLN: 1000.0000 mg | INTRAVENOUS | Status: DC

## 2019-07-03 MED ORDER — ATORVASTATIN CALCIUM 10 MG PO TABS
20.0000 mg | ORAL_TABLET | Freq: Every day | ORAL | Status: DC
  Administered 2019-07-03: 20 mg via ORAL
  Filled 2019-07-03: qty 2

## 2019-07-03 MED ORDER — LABETALOL HCL 200 MG PO TABS
300.0000 mg | ORAL_TABLET | Freq: Two times a day (BID) | ORAL | Status: DC
  Administered 2019-07-03: 22:00:00 300 mg via ORAL
  Filled 2019-07-03: qty 1

## 2019-07-03 MED ORDER — OXYCODONE HCL 5 MG PO TABS
ORAL_TABLET | ORAL | Status: AC
  Filled 2019-07-03: qty 2

## 2019-07-03 MED ORDER — ALBUTEROL SULFATE (2.5 MG/3ML) 0.083% IN NEBU: 2.5000 mg | INHALATION_SOLUTION | RESPIRATORY_TRACT | Status: DC | PRN

## 2019-07-03 MED ORDER — LIDOCAINE HCL (PF) 1 % IJ SOLN: 5.0000 mL | INTRAMUSCULAR | Status: DC | PRN

## 2019-07-03 MED ORDER — ONDANSETRON HCL 4 MG/2ML IJ SOLN: 4.0000 mg | Freq: Four times a day (QID) | INTRAMUSCULAR | Status: DC | PRN

## 2019-07-03 MED ORDER — ONDANSETRON HCL 4 MG PO TABS: 4.0000 mg | ORAL_TABLET | Freq: Four times a day (QID) | ORAL | Status: DC | PRN
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
## 2019-07-03 NOTE — Progress Notes (Signed)
Patient ID: Frank Fuller, male   DOB: 14-Jun-1961, 58 y.o.   MRN: [id number]  Patient arrived to unit admitted to room 4M04. Patient alert, patient  oriented to rehab, medications, rehab schedule, and plan of care was reviewed. Patient verbalized understanding . No concerns/questions noted at this time

## 2019-07-03 NOTE — Discharge Summary (Addendum)
Physician Discharge Summary  Frank Fuller MLY:[id number] DOB: February 13, 1961 DOA: 06/13/2019  PCP: [facility]  Admit date: 06/13/2019 Discharge date: 07/03/2019  Admitted From: Home  Disposition:  CIR  Recommendations for Outpatient Follow-up:  1. Follow up with PCP in 1-2 weeks 2. Please obtain BMP/CBC in one week 3. Needs to follow-up with neurology in 4 weeks. 4. Needs to follow up with infectious diseases.  5. Needs IV antibiotics on day 07/29/2019   Discharge Condition: Stable CODE STATUS: Full code Diet recommendation: Heart Healthy   Brief/Interim Summary: 58 year old with past medical history significant for hypertension, end-stage renal disease on hemodialysis Tuesday Thursday and Saturday, abdominal aortic aneurysm status post a stent, hepatitis C virus status post treatment, depression, CVA who initially presented to [facility] with multiple complaints.  He was recently hospitalized from 8/11 until 8/20 for dissection of thoracic aorta type B and was treated with endovascular stent graft with a spinal drain by Frank Fuller on 8/14.  He was discharged home but immediately came back with complaints of urge  to use the restroom but unable to do so.  He also had fever.  He was found to have paraplegia.  He was suspected to have thoracic spinal ischemia.  CT imaging also showed possible osteomyelitis and discitis of L4-L5.  Also suspected to have sepsis secondary to possible pneumonia.  Underwent disc aspiration by IR on 06/18/2019.  Neurosurgery and vascular surgery and ID were following.  Hospital course remarkable for multiple episodes of bloody bowel movement with clots.  FOBT positive with acute blood loss anemia.  Underwent endoscopy and flexible sigmoidoscopy on 06/24/2019 with finding of rectal ulceration. Currently patient is awaiting disposition CIR versus a skilled nursing facility.  1-Secondary to discitis/osteomyelitis: Patient presented with fever, leukocytosis, ESR  138, CRP procalcitonin was elevated.  CT also showed possible right-sided pneumonia with proctitis.  MRI of the lumbar spine show numerous space-occupying material in the lower retroperitoneum and lumbar sacral junction.  Favor acute discitis and osteomyelitis at L4-L5 with prevertebral phlegmon.  Cultures from [facility] did not show any growth.  Culture sent here has not shown growth of any bacteria. -IR consulted and underwent disc aspiration on 8/25, culture negative. -ID consulted recommendation for 6 weeks of IV vancomycin and cefepime with hemodialysis, end date on 07/29/2019 -ESR 67 and CRP 5.5 -Need inflammatory marker to be check every 2 weeks next on 9/21.  2-lower GI bleed, erosive gastropathy, rectal ulcers, H. pylori gastritis: Patient has several episode of bloody bowel movement with clots and also complaining of abdominal pain.  FOBT was positive.  CT angiogram did not show any active bleeding. -Underwent endoscopy and flexible sigmoidoscopy on 9/1 with finding of nonbleeding erosive gastropathy and multiple or ulcers in the rectum due to fecal impaction but no bleeding.  Frank Fuller was deployed throughout the entire rectum. -Continue with MiraLAX and Metamucil -On Carafate edema twice daily for 2 weeks. -Bismuth Flagyl and tetracycline for 2 weeks for H. pylori treatment On PPI twice daily for 2 weeks then transition to once daily. Continue with oxycodone for rectal pain as needed Status post 1 unit blood transfusion with dialysis He will need H. pylori testing in 8 weeks following antibiotic treatment to document eradication Per GI consider repeat flexible sigmoidoscopy with APC if rebleeding occurs and consider colonoscopy outpatient for colorectal cancer screening Hemoglobin has remained stable.  He is on a baby aspirin and tolerating it.  Acute on chronic anemia and acute blood loss anemia As above.  Patient has received a total of 5 units of packed red blood cell. On  Aranesp.  Acute hypoxic respiratory failure: Resolved Treated for pneumonia.  Volume management during dialysis.  Chest pain elevated troponin: Chest pain-free flat elevation of troponin. Likely supply demand ischemia. Resolved.  End-stage renal disease on hemodialysis: Nephrology following. Hypertension: Continue with Norvasc and labetalol and hydralazine.  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 Abnormal spinal cord at T8-9 T10 T10-T11 possible segmental cord infarct Neurosurgery neurology was consulted and they signed off.  No plan for intervention. Recommended daily baby aspirin by neurology.  Patient to follow-up with neurology Frank Fuller in 4 weeks. Strength of bilateral lower extremity have improved slightly. Patient will be transferred to CIR today.     Discharge Diagnoses:  Principal Problem:   Lumbar discitis Active Problems:   ESRD on hemodialysis ([facility])   Anemia due to GI blood loss   Ileus (HCC)   Proctitis   H/O endovascular stent graft for abdominal aortic aneurysm   Gastric ulcer   Rectal ulceration   H. pylori infection    Discharge Instructions  Discharge Instructions    Ambulatory referral to Neurology   Complete by: As directed    Follow up with Frank Fuller at [facility] in 4-6 weeks. Too complicated for RN to follow. Thanks.   Diet - low sodium heart healthy   Complete by: As directed    Diet - low sodium heart healthy   Complete by: As directed    Increase activity slowly   Complete by: As directed    Increase activity slowly   Complete by: As directed      Allergies as of 07/03/2019      Reactions   Oxycodone Nausea Only   Chlorhexidine Other (See Comments)   Unknown reaction Patch skin test done at dialysis 06/26/17  - staff using clear dressing and alcohol to clean exit site of  catheter   Clonidine Derivatives Other (See Comments)   Dizziness    Lisinopril Other (See Comments)   unresponsive   Carvedilol Rash      Medication List    STOP taking these medications   Auryxia 1 GM 210 MG(Fe) tablet Generic drug: ferric citrate   Ensure   losartan 100 MG tablet Commonly known as: COZAAR   minoxidil 2.5 MG tablet Commonly known as: LONITEN     TAKE these medications   amLODipine 10 MG tablet Commonly known as: NORVASC Take 1 tablet (10 mg total) by mouth at bedtime.   aspirin 81 MG EC tablet Take 1 tablet (81 mg total) by mouth daily.   atorvastatin 20 MG tablet Commonly known as: LIPITOR Take 1 tablet (20 mg total) by mouth daily at 6 PM.   bismuth subsalicylate 197 MG chewable tablet Commonly known as: PEPTO BISMOL Chew 1 tablet (262 mg total) by mouth 4 (four) times daily for 14 days.   ceFEPIme 2 g in sodium chloride 0.9 % 100 mL Inject 2 g into the vein every Tuesday, Thursday, and Saturday at 6 PM.   Darbepoetin Alfa 150 MCG/0.3ML Sosy injection Commonly known as: ARANESP Inject 0.3 mLs (150 mcg total) into the vein every Saturday with hemodialysis.   hydrALAZINE 100 MG tablet Commonly known as: APRESOLINE Take 1 tablet (100 mg total) by mouth 3 (three) times daily. Hold for systolic blood pressure 588/32   labetalol 300 MG  tablet Commonly known as: NORMODYNE Take 1 tablet (300 mg total) by mouth 2 (two) times daily.   lanthanum 1000 MG chewable tablet Commonly known as: FOSRENOL Chew 1 tablet (1,000 mg total) by mouth 3 (three) times daily with meals.   lidocaine-prilocaine cream Commonly known as: EMLA Apply 1 application topically See admin instructions. Apply topically three times week (Tuesday, Thursday, Saturday) prior to port access   metroNIDAZOLE 250 MG tablet Commonly known as: FLAGYL Take 1 tablet (250 mg total) by mouth 4 (four) times daily for 14 days.   NONFORMULARY OR COMPOUNDED ITEM Place 2 g rectally 2  (two) times daily for 12 days.   oxyCODONE 5 MG immediate release tablet Commonly known as: Oxy IR/ROXICODONE Take 1 tablet (5 mg total) by mouth every 4 (four) hours as needed for up to 7 days for moderate pain.   pantoprazole 40 MG tablet Commonly known as: PROTONIX Take 1 tablet (40 mg total) by mouth 2 (two) times daily for 14 days.   polyethylene glycol 17 g packet Commonly known as: MIRALAX / GLYCOLAX Take 17 g by mouth daily.   tetracycline 500 MG capsule Commonly known as: SUMYCIN Take 1 capsule (500 mg total) by mouth 4 (four) times daily for 14 days.   vancomycin 1-5 GM/200ML-% Soln Commonly known as: VANCOCIN Inject 200 mLs (1,000 mg total) into the vein Every Tuesday,Thursday,and Saturday with dialysis.      Follow-up Information    Garvin Fila, MD. Schedule an appointment as soon as possible for a visit in 4 week(s).   Specialties: Neurology, Radiology Contact information: [address] [place] [address] [phone]        Golden Circle, FNP Follow up.   Specialties: Family Medicine, Infectious Diseases Why: 07/23/19 at 3 pm. If you are unable to make this appointment please call our office to reschedule.  Contact information: [address] [place] [address] [phone]          Allergies  Allergen Reactions  . Oxycodone Nausea Only  . Chlorhexidine Other (See Comments)    Unknown reaction Patch skin test done at dialysis 06/26/17  - staff using clear dressing and alcohol to clean exit site of catheter  . Clonidine Derivatives Other (See Comments)    Dizziness   . Lisinopril Other (See Comments)    unresponsive  . Carvedilol Rash    Consultations:  Nephrology  Neurology  Neurosurgery    Procedures/Studies: Dg Chest 1 View  Result Date: 06/18/2019 CLINICAL DATA:  Shortness of breath, cough. EXAM: CHEST  1 VIEW COMPARISON:  Radiograph of June 15, 2019. FINDINGS: Stable cardiomediastinal  silhouette. Status post stent graft repair of descending thoracic aorta. No pneumothorax or pleural effusion is noted. Right internal jugular catheter is unchanged in position. Minimal right basilar subsegmental atelectasis is noted. Increased left basilar opacity is noted concerning for worsening atelectasis. Bony thorax is unremarkable. IMPRESSION: Increased left basilar opacity is noted concerning for worsening atelectasis. Minimal right basilar subsegmental atelectasis is noted. Electronically Signed   By: Marijo Conception M.D.   On: 06/18/2019 10:56   Dg Chest 2 View  Result Date: 06/03/2019 CLINICAL DATA:  Chest pain and shortness of breath EXAM: CHEST - 2 VIEW COMPARISON:  05/26/2019 FINDINGS: Cardiac shadow is stable. Aortic calcifications are again seen. Changes consistent with the known descending thoracic aortic dissection are again seen similar to that noted on recent CT of the chest. Dialysis catheter is again noted and  stable. No focal infiltrate or sizable effusion is seen. No bony abnormality is noted. IMPRESSION: Stable aortic dissection similar to that seen on recent CT examination. No acute abnormality noted. Electronically Signed   By: Inez Catalina M.D.   On: 06/03/2019 15:20   Dg Abd 1 View  Result Date: 06/18/2019 CLINICAL DATA:  Abdominal pain. EXAM: ABDOMEN - 1 VIEW COMPARISON:  Radiographs of June 12, 2019. FINDINGS: Stable large amount of residual contrast and stool is seen throughout the colon. No significant small bowel dilatation is noted. No abnormal calcifications are noted. IMPRESSION: Stable large amount of residual contrast and stool is seen throughout the colon. No abnormal bowel dilatation is noted. Electronically Signed   By: Marijo Conception M.D.   On: 06/18/2019 10:59   Ct Head Wo Contrast  Result Date: 06/21/2019 CLINICAL DATA:  58 year old male with new onset seizure. EXAM: CT HEAD WITHOUT CONTRAST TECHNIQUE: Contiguous axial images were obtained from the base of  the skull through the vertex without intravenous contrast. COMPARISON:  01/10/2019 CT and prior studies FINDINGS: Brain: No evidence of acute infarction, hemorrhage, hydrocephalus, extra-axial collection or mass lesion/mass effect. Chronic small-vessel white matter ischemic changes and remote LEFT basal ganglia infarct again noted. Vascular: Carotid and vertebral atherosclerotic calcifications noted. Skull: Normal. Negative for fracture or focal lesion. Sinuses/Orbits: No acute finding. Other: None. IMPRESSION: 1. No evidence of acute intracranial abnormality. 2. Chronic small-vessel white matter ischemic changes and remote LEFT basal ganglia infarct. Electronically Signed   By: Margarette Canada M.D.   On: 06/21/2019 21:37   Ct Head Wo Contrast  Result Date: 06/15/2019 CLINICAL DATA:  Fatigue and malaise. Hypertension. End-stage renal disease. EXAM: CT HEAD WITHOUT CONTRAST TECHNIQUE: Contiguous axial images were obtained from the base of the skull through the vertex without intravenous contrast. COMPARISON:  MRI of the head 12/13/2016 FINDINGS: Brain: A remote lacunar infarct of the left caudate head is new, but not acute. Extensive periventricular white matter changes are otherwise stable. Confluent subcortical white matter hypoattenuation is also stable, right greater than left. No acute cortical infarct, hemorrhage, or mass lesion is present. The ventricles are proportionate to the degree of atrophy. No significant extraaxial fluid collection is present. The brainstem and cerebellum are within normal limits. Vascular: Atherosclerotic changes are noted within the cavernous internal carotid arteries bilaterally. There is no hyperdense vessel. Skull: Calvarium is intact. No focal lytic or blastic lesions are present. No significant extracranial soft tissue lesion is present. Sinuses/Orbits: The paranasal sinuses and mastoid air cells are clear. The globes and orbits are within normal limits. IMPRESSION: 1. Left  caudate head lacunar infarct is new since the prior study but appears remote. 2. Otherwise stable extensive periventricular and subcortical white matter changes bilaterally consistent chronic microvascular ischemia. 3. No acute intracranial abnormality. Electronically Signed   By: San Morelle M.D.   On: 06/15/2019 11:47   Mr Cervical Spine Wo Contrast  Result Date: 06/15/2019 CLINICAL DATA:  58 year old male with acute myelopathy. Known aortic dissection, recently treated with stenting. EXAM: MRI CERVICAL AND THORACIC SPINE WITHOUT CONTRAST TECHNIQUE: Multiplanar and multiecho pulse sequences of the cervical spine, to include the craniocervical junction and cervicothoracic junction, and the thoracic spine, were obtained without intravenous contrast. COMPARISON:  CTA CT Chest, Abdomen, and Pelvis 06/13/2019 and earlier. No prior cervical spine imaging, but brain MRI 12/13/2016. FINDINGS: MRI CERVICAL SPINE FINDINGS Alignment: Straightening of cervical lordosis. No spondylolisthesis. Vertebrae: Degenerative appearing endplate marrow signal changes at C5-C6 with faint marrow edema. No  other marrow edema or evidence of acute osseous abnormality. Background bone marrow signal is within normal limits. Cord: Mass effect on the cervical spinal cord at C4-C5 and C5-C6. Is difficult to exclude abnormal cord signal at both levels such as due to myelomalacia due to compressive myelopathy (series 8, image 24 at C5-C6). And there is evidence of abnormal signal on series 8, image 24 greater on the left. Above and below those levels spinal cord signal and morphology is within normal limits. Posterior Fossa, vertebral arteries, paraspinal tissues: Cervicomedullary junction is within normal limits. Negative visible posterior fossa. Preserved major vascular flow voids in the neck. The left vertebral artery appears dominant. Negative neck soft tissues. Disc levels: Degenerative changes notable for: C3-C4: Right eccentric  circumferential disc osteophyte complex with mild to moderate right C4 foraminal stenosis. C4-C5: Circumferential disc osteophyte complex with broad-based posterior component. Spinal stenosis with mild to moderate spinal cord mass effect (series 8, image 20). Severe left and moderate to severe right C5 foraminal stenosis. C5-C6: Disc space loss with circumferential disc osteophyte complex. Broad-based posterior component. Mild spinal stenosis and mild to moderate cord mass effect. Severe bilateral C6 foraminal stenosis. C6-C7: Circumferential disc osteophyte complex mostly affecting the neural foramina. Borderline to mild spinal stenosis without cord mass effect. Severe bilateral C7 foraminal stenosis. C7-T1: Mild to moderate facet hypertrophy greater on the left. Mild to moderate left C8 foraminal stenosis. MRI THORACIC SPINE FINDINGS Thoracic spine segmentation:  Normal on the comparison CT. Alignment:  Preserved thoracic kyphosis.  No spondylolisthesis. Vertebrae: Mild endplate irregularity including superiorly at T8 appears to be stable and chronic. STIR hyperintensity at the inferior T6 endplate appears to be related to a Schmorl's node (series 14, image 10). No acute osseous abnormality identified. Cord: Above T10 the thoracic spinal cord signal and morphology is within normal limits. Beginning at T9-T10 there is suggestion of abnormal increased central cord signal (series 16, image 31), although no definite cord expansion. By the mid T11 level the cord signal normalizes, and the visible conus appears normal, terminating at T12-L1. Fairly capacious thoracic spinal canal. Paraspinal and other soft tissues: Thoracic aortic dissection with evidence of stent redemonstrated on series 16, image 16. Left pleural effusion appears regressed. Negative visible upper abdominal viscera. There is patchy nonspecific T2 and STIR hyperintensity in the midthoracic erector spinal muscles in the midline posteriorly (series 14,  image 10 and series 16, image 20). Little if any muscle expansion. Other paraspinal soft tissues are within normal limits. Disc levels: Endplate degeneration but no thoracic spinal stenosis. No discrete thoracic disc herniation. IMPRESSION: 1. 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 2. More questionably abnormal spinal cord at the T9-T10 and T10-T11 level where central cord signal abnormality without cord expansion might indicate a segmental cord infarct in this clinical setting. But the remaining thoracic spinal cord and conus appear normal. 3. Nonspecific mild bilateral posterior paraspinal muscle inflammation at the midthoracic level (T6 through T9). 4. Other cervical spine degeneration including up to severe multilevel cervical neural foraminal stenosis, and also mild spinal stenosis at C6-C7. 5. Thoracic aortic dissection and endograft re-demonstrated. Electronically Signed   By: Genevie Ann M.D.   On: 06/15/2019 17:20   Mr Thoracic Spine Wo Contrast  Result Date: 06/15/2019 CLINICAL DATA:  58 year old male with acute myelopathy. Known aortic dissection, recently treated with stenting. EXAM: MRI CERVICAL AND THORACIC SPINE WITHOUT CONTRAST TECHNIQUE: Multiplanar and multiecho pulse  sequences of the cervical spine, to include the craniocervical junction and cervicothoracic junction, and the thoracic spine, were obtained without intravenous contrast. COMPARISON:  CTA CT Chest, Abdomen, and Pelvis 06/13/2019 and earlier. No prior cervical spine imaging, but brain MRI 12/13/2016. FINDINGS: MRI CERVICAL SPINE FINDINGS Alignment: Straightening of cervical lordosis. No spondylolisthesis. Vertebrae: Degenerative appearing endplate marrow signal changes at C5-C6 with faint marrow edema. No other marrow edema or evidence of acute osseous abnormality. Background bone marrow signal is within normal limits.  Cord: Mass effect on the cervical spinal cord at C4-C5 and C5-C6. Is difficult to exclude abnormal cord signal at both levels such as due to myelomalacia due to compressive myelopathy (series 8, image 24 at C5-C6). And there is evidence of abnormal signal on series 8, image 24 greater on the left. Above and below those levels spinal cord signal and morphology is within normal limits. Posterior Fossa, vertebral arteries, paraspinal tissues: Cervicomedullary junction is within normal limits. Negative visible posterior fossa. Preserved major vascular flow voids in the neck. The left vertebral artery appears dominant. Negative neck soft tissues. Disc levels: Degenerative changes notable for: C3-C4: Right eccentric circumferential disc osteophyte complex with mild to moderate right C4 foraminal stenosis. C4-C5: Circumferential disc osteophyte complex with broad-based posterior component. Spinal stenosis with mild to moderate spinal cord mass effect (series 8, image 20). Severe left and moderate to severe right C5 foraminal stenosis. C5-C6: Disc space loss with circumferential disc osteophyte complex. Broad-based posterior component. Mild spinal stenosis and mild to moderate cord mass effect. Severe bilateral C6 foraminal stenosis. C6-C7: Circumferential disc osteophyte complex mostly affecting the neural foramina. Borderline to mild spinal stenosis without cord mass effect. Severe bilateral C7 foraminal stenosis. C7-T1: Mild to moderate facet hypertrophy greater on the left. Mild to moderate left C8 foraminal stenosis. MRI THORACIC SPINE FINDINGS Thoracic spine segmentation:  Normal on the comparison CT. Alignment:  Preserved thoracic kyphosis.  No spondylolisthesis. Vertebrae: Mild endplate irregularity including superiorly at T8 appears to be stable and chronic. STIR hyperintensity at the inferior T6 endplate appears to be related to a Schmorl's node (series 14, image 10). No acute osseous abnormality identified. Cord:  Above T10 the thoracic spinal cord signal and morphology is within normal limits. Beginning at T9-T10 there is suggestion of abnormal increased central cord signal (series 16, image 31), although no definite cord expansion. By the mid T11 level the cord signal normalizes, and the visible conus appears normal, terminating at T12-L1. Fairly capacious thoracic spinal canal. Paraspinal and other soft tissues: Thoracic aortic dissection with evidence of stent redemonstrated on series 16, image 16. Left pleural effusion appears regressed. Negative visible upper abdominal viscera. There is patchy nonspecific T2 and STIR hyperintensity in the midthoracic erector spinal muscles in the midline posteriorly (series 14, image 10 and series 16, image 20). Little if any muscle expansion. Other paraspinal soft tissues are within normal limits. Disc levels: Endplate degeneration but no thoracic spinal stenosis. No discrete thoracic disc herniation. IMPRESSION: 1. 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 2. More questionably abnormal spinal cord at the T9-T10 and T10-T11 level where central cord signal abnormality without cord expansion might indicate a segmental cord infarct in this clinical setting. But the remaining thoracic spinal cord and conus appear normal. 3. Nonspecific mild bilateral posterior paraspinal muscle inflammation at the midthoracic level (T6 through T9). 4. Other cervical spine degeneration including up to severe multilevel cervical neural foraminal  stenosis, and also mild spinal stenosis at C6-C7. 5. Thoracic aortic dissection and endograft re-demonstrated. Electronically Signed   By: Genevie Ann M.D.   On: 06/15/2019 17:20   Mr Lumbar Spine Wo Contrast  Result Date: 06/15/2019 CLINICAL DATA:  58 year old male with thoracic aortic dissection recently treated with stent/endograft. Myelopathy.  Cervical and thoracic MRI earlier today. EXAM: MRI LUMBAR SPINE WITHOUT CONTRAST TECHNIQUE: Multiplanar, multisequence MR imaging of the lumbar spine was performed. No intravenous contrast was administered. COMPARISON:  Cervical and thoracic MRI earlier today. [place] stent CT Chest, Abdomen, and Pelvis. 06/13/2019 FINDINGS: Segmentation:  Normal. Alignment:  Stable lumbar lordosis. Vertebrae: Background bone marrow signal is within normal limits. There are degenerative endplate marrow signal changes at both L4-L5 and L5-S1, and there is rightward endplate marrow edema at L4-L5 (series 6, image 6), however there is some preserved fatty degenerative endplate marrow signal at both levels. See additional L4-L5 and L5-S1 details below. No other acute osseous abnormality identified. No sacral edema and negative visible SI joints. Conus medullaris and cauda equina: Conus extends to the T12-L1 level, the lower thoracic spinal cord was described earlier today. Paraspinal and other soft tissues: There is abnormal prevertebral and retroperitoneal material in the lower abdomen and pelvis best seen on series 6, image 9. Some of this appears to be anterior to the aortoiliac bifurcation, but also is located in the lower lumbar and sacral prevertebral space. This seems to be new since the CT on 06/13/2019, but is of unclear etiology and significance. Furthermore, note that although the L4-L5 endplates demonstrate some marrow edema eccentric to the right, the adjacent right psoas muscle seems to remain normal on series 8, image 24. Both psoas muscles appear to remain normal throughout their course. Otherwise abnormal kidneys and fusiform aneurysmal enlargement of the abdominal aorta are redemonstrated. The posterior paraspinal soft tissues appear negative. Disc levels: L1-L2: Mild L1 foraminal stenosis greater on the left related to disc bulging and posterior element hypertrophy. L2-L3: Multifactorial mild to  moderate spinal stenosis related to circumferential disc bulge, posterior element hypertrophy, and epidural lipomatosis. Moderate left greater than right L3 foraminal stenosis. L3-L4: Moderate to severe multifactorial spinal stenosis related to circumferential disc bulge, posterior element hypertrophy and some epidural lipomatosis. See series 8, image 19. Moderate bilateral L3 foraminal stenosis. L4-L5: Increased T2 and STIR signal in the central and right disc. There was some vacuum phenomena here on the recent CT. Bulky underlying circumferential disc osteophyte complex eccentric to the right. Moderate posterior element hypertrophy. Mild spinal and moderate lateral recess stenosis. Mild to moderate left and severe right L4 foraminal stenosis. L5-S1: Similar increased T2 signal in the disc and bulky circumferential disc osteophyte complex although no convincing endplate marrow edema at this level. Mild epidural lipomatosis and posterior element hypertrophy. Moderate to severe bilateral L5 foraminal stenosis. IMPRESSION: 1. The dominant finding is new space-occupying material in the lower retroperitoneum and lumbosacral junction prevertebral space since the CT on 06/13/19. And although the L4-L5 disc and right endplates appear abnormal, the adjacent psoas muscle seem unaffected. However, in discussing this case just now with Dr. 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 Furthermore, we discussed that the hematocrit has been stable since 06/13/2019 - arguing against retroperitoneal hematoma. 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 No drainable fluid collection/abscess is identified. 2. Underlying lower lumbar spine degeneration with multifactorial moderate to severe spinal  and/or neural foraminal stenosis L3-L4 through L5-S1. 3. Abdominal aortic aneurysm and native renal atrophy  partially redemonstrated. Electronically Signed   By: Genevie Ann M.D.   On: 06/15/2019 20:29   Dg Chest Port 1 View  Result Date: 06/15/2019 CLINICAL DATA:  Reason for exam: ESRD. Sepsis Hx HTN, pneumonia, wide-complex tachycardia EXAM: PORTABLE CHEST 1 VIEW COMPARISON:  CT of the chest on 06/13/2019 performed at [facility]. Chest x-ray on 06/04/2019 from [place]: Patient has RIGHT-sided dialysis catheter, tip overlying the level of the superior vena cava. Patient has had aortic stent graft since the previous chest x-ray. There is subsegmental atelectasis at both lung bases. CT exclude early LEFT LOWER lobe infiltrate. IMPRESSION: 1. Bibasilar atelectasis. 2. LEFT LOWER lobe infiltrate cannot be excluded. Electronically Signed   By: Nolon Nations M.D.   On: 06/15/2019 14:43   Dg Chest Port 1 View  Result Date: 06/04/2019 CLINICAL DATA:  Aortic dissection. EXAM: PORTABLE CHEST 1 VIEW COMPARISON:  Chest x-ray from yesterday. FINDINGS: Unchanged tunneled right internal jugular dialysis catheter and left internal jugular central venous catheter. Stable cardiomediastinal silhouette and changes of aortic dissection with medial displacement of the aortic arch atherosclerotic calcifications. No focal consolidation, pleural effusion, or pneumothorax. No acute osseous abnormality. IMPRESSION: 1. Stable changes of known aortic dissection. Electronically Signed   By: Titus Dubin M.D.   On: 06/04/2019 08:41   Dg Chest Port 1 View  Result Date: 06/03/2019 CLINICAL DATA:  Check central line placement EXAM: PORTABLE CHEST 1 VIEW COMPARISON:  Film from earlier in the same day. FINDINGS: Left jugular central line is now been placed with the catheter tip in the mid superior vena cava. No pneumothorax is seen. Lungs remain clear. Changes of aortic dissection are again seen. Cardiac shadow is stable. IMPRESSION: No pneumothorax following central line placement. Electronically Signed   By: Inez Catalina  M.D.   On: 06/03/2019 20:51   Dg Abd Portable 1v  Result Date: 06/21/2019 CLINICAL DATA:  Abdominal pain. EXAM: PORTABLE ABDOMEN - 1 VIEW COMPARISON:  06/13/2019 FINDINGS: A large amount of colonic stool is again noted. No dilated small bowel loops are present. Aortic graft again noted. IMPRESSION: Large amount of colonic stool. No evidence of small bowel obstruction. Electronically Signed   By: Margarette Canada M.D.   On: 06/21/2019 20:35   Ct Angio Chest/abd/pel For Dissection W And/or W/wo  Result Date: 06/03/2019 CLINICAL DATA:  Known dissection, elevated blood pressure EXAM: CT ANGIOGRAPHY CHEST, ABDOMEN AND PELVIS TECHNIQUE: Multidetector CT imaging through the chest, abdomen and pelvis was performed using the standard protocol during bolus administration of intravenous contrast. Multiplanar reconstructed images and MIPs were obtained and reviewed to evaluate the vascular anatomy. CONTRAST:  181m OMNIPAQUE IOHEXOL 350 MG/ML SOLN COMPARISON:  Numerous priors most recent angiography May 30, 2019 and May 26, 2019 FINDINGS: CTA CHEST FINDINGS Cardiovascular: Noncontrast CT images demonstrate displaced intimal calcification of the thoracic aortic arch. The aortic root is incompletely evaluated due to cardiac pulsation artifact though there is gross preservation of the sino-tubular junction. Ascending aorta is normal caliber. There is a common origin of the right brachiocephalic and left common carotid. Post-contrast CT angiographic images demonstrate proximal migration of the previously seen dissection now beginning at the ostium of the left subclavian artery and continuing inferiorly towards the aortic hiatus. There is non opacification of the false lumen within the proximal descending thoracic aorta with progressive narrowing of the true lumen when compared to prior study from May 30, 2019 measuring only 13 x 25 mm in caliber, previously 18 x 28 when measured at a similar level. Conversely there has  been increasing aortic caliber now measuring up to 4.5 cm in maximum diameter, previously 4.1 cm. Dissection flap terminus is at the level of the diaphragmatic hiatus with small fenestration. No involvement of the celiac trunk. Mediastinum/Nodes: There is increasing hazy stranding in the region of the mediastinum adjacent the aorta in the region of the ligamentum arteriosum near the [place]xim[place] stones a aortic dissection. No enlarged mediastinal or axillary lymph nodes. Thyroid gland is unremarkable. Secretions are present in the trachea and proximal airways. The esophagus is patulous and fluid-filled. Lungs/Pleura: Dependent atelectatic changes are seen posteriorly. Mild centrilobular emphysema. Increasing size of a left pleural effusion. Airspace opacities are again seen in the anterior (12/66) and posterolateral right upper lobe (12/90), not significantly changed from prior study. Musculoskeletal: No chest wall mass or suspicious bone lesions identified. Review of the MIP images confirms the above findings. CTA ABDOMEN AND PELVIS FINDINGS VASCULAR Aorta: Mixture of calcified and noncalcified atheromatous plaque is present within the abdominal aorta. Aneurysmal outpouching of the infrarenal abdominal aortic aneurysm measures up to 3.5 cm in maximal diameter. Standard branching of the abdominal aorta. No dissection involvement of the celiac trunk ostia. Celiac: Calcification in the proximal celiac axis. No dissection propagation occlusion or stenosis. No vasculitis. SMA: Patent without evidence of aneurysm, dissection, vasculitis or significant stenosis. Renals: Both renal arteries are patent without evidence of aneurysm, dissection, vasculitis, fibromuscular dysplasia or significant stenosis. IMA: Patent without evidence of aneurysm, dissection, vasculitis or significant stenosis. Inflow: Small dissection flap is noted in the proximal left internal iliac artery in the region of irregular atheromatous plaque (10/261)  additional dissection flap is present in the proximal right common femoral artery (10/289). Neither is significantly changed from previous exams. Remaining segments in the inflow are heavily diseased but without aneurysm or ectasia or other acute vascular finding. Veins: Major venous structures are unremarkable. Review of the MIP images confirms the above findings. NON-VASCULAR Hepatobiliary: No focal liver abnormality is seen. No gallstones, gallbladder wall thickening, or biliary dilatation. Vicarious extravasation of contrast medium is present within the gallbladder. Pancreas: Unremarkable. No pancreatic ductal dilatation or surrounding inflammatory changes. Spleen: Normal in size without focal abnormality. Adrenals/Urinary Tract: Adrenal glands are unremarkable. Symmetric renal atrophy. No concerning renal lesions. No hydronephrosis. High attenuation material in the bladder likely excreted contrast. Mild bladder wall thickening, likely related to underdistention. Stomach/Bowel: Distal esophagus, stomach and duodenal sweep are unremarkable. No bowel wall thickening or dilatation. No evidence of obstruction. Scattered colonic diverticula without focal pericolonic inflammation to suggest diverticulitis. Lymphatic: No enlarged lymph nodes. Reproductive: The prostate and seminal vesicles are unremarkable. Other: No abdominopelvic free fluid or free gas. No bowel containing hernias. Musculoskeletal: Multilevel degenerative changes are present in the imaged portions of the spine. Findings are maximal at L5-S1. Review of the MIP images confirms the above findings. IMPRESSION: 1. Proximal migration of the previously seen dissection now beginning at the ostium of the left subclavian artery and continuing inferiorly towards the aortic hiatus. There is non opacification of the false lumen within the proximal descending thoracic aorta with progressive narrowing of the true lumen. Conversely there has been increasing aortic  caliber now measuring up to 4.5 cm in maximum diameter, previously 4.1 cm. 2. Increasing mediastinal hazy stranding adjacent the aortic dissection with enlarging left pleural effusion are features highly concerning for pending rupture. Urgent surgical evaluation is warranted.  3. Dissection flap terminus is at the level of the diaphragmatic hiatus with small fenestration. No involvement of the celiac trunk. 4. Unchanged small dissection flap in the proximal left internal iliac and proximal right common femoral arteries. 5. Persistent airspace opacities in the right upper lobe. 6. Vicarious extravasation of contrast within the gallbladder. 7. Symmetric renal atrophy. 8. Emphysema (ICD10-J43.9). 9. Aortic Atherosclerosis (ICD10-I70.0). 10.  Aortic aneurysm NOS (ICD10-I71.9). These results were called by telephone at the time of interpretation on 06/03/2019 at 7:12 pm to Dr. Charlesetta Shanks , who verbally acknowledged these results. Electronically Signed   By: Lovena Le M.D.   On: 06/03/2019 19:16   Vas [place] Upper Extremity Venous Duplex  Result Date: 06/09/2019 UPPER VENOUS STUDY  Indications: Edema Risk Factors: None identified. Comparison Study: No prior studies. Performing Technologist: Oliver Hum RVT  Examination Guidelines: A complete evaluation includes B-mode imaging, spectral Doppler, color Doppler, and power Doppler as needed of all accessible portions of each vessel. Bilateral testing is considered an integral part of a complete examination. Limited examinations for reoccurring indications may be performed as noted.  Right Findings: +----------+------------+---------+-----------+----------+-------+ RIGHT     CompressiblePhasicitySpontaneousPropertiesSummary +----------+------------+---------+-----------+----------+-------+ Subclavian    Full       Yes       Yes                      +----------+------------+---------+-----------+----------+-------+  Left Findings:  +----------+------------+---------+-----------+----------+-------+ LEFT      CompressiblePhasicitySpontaneousPropertiesSummary +----------+------------+---------+-----------+----------+-------+ IJV           Full       Yes       Yes                      +----------+------------+---------+-----------+----------+-------+ Subclavian    Full       Yes       Yes                      +----------+------------+---------+-----------+----------+-------+ Axillary      Full       Yes       Yes                      +----------+------------+---------+-----------+----------+-------+ Brachial      Full       Yes       Yes                      +----------+------------+---------+-----------+----------+-------+ Radial        Full                                          +----------+------------+---------+-----------+----------+-------+ Ulnar         Full                                          +----------+------------+---------+-----------+----------+-------+ Cephalic      Full                                          +----------+------------+---------+-----------+----------+-------+ Basilic       Full                                          +----------+------------+---------+-----------+----------+-------+  Summary:  Right: No evidence of thrombosis in the subclavian.  Left: No evidence of deep vein thrombosis in the upper extremity. No evidence of superficial vein thrombosis in the upper extremity.  *See table(s) above for measurements and observations.  Diagnosing physician: Servando Snare MD Electronically signed by Servando Snare MD on 06/09/2019 at 2:17:27 PM.    Final    Hybrid Or Imaging (mc Only)  Result Date: 06/06/2019 There is no interpretation for this exam.  This order is for images obtained during a surgical procedure.  Please See "Surgeries" Tab for more information regarding the procedure.   Ir Lumbar Disc Aspiration W/img Guide  Result Date:  06/17/2019 INDICATION: Low back pain.  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 No drainable fluid collection/abscess is identified. EXAM: LUMBAR L4-5 DISC ASPIRATION UNDER FLUOROSCOPY MEDICATIONS: Lidocaine 1% subcutaneous ANESTHESIA/SEDATION: Intravenous Fentanyl 122mg and Versed 241mwere administered as conscious sedation during continuous monitoring of the patient's level of consciousness and physiological / cardiorespiratory status by the radiology RN, with a total moderate sedation time of 15 minutes. PROCEDURE: Informed written consent was obtained from the patient after a thorough discussion of the procedural risks, benefits and alternatives. All questions were addressed. Maximal Sterile Barrier Technique was utilized including caps, mask, sterile gowns, sterile gloves, sterile drape, hand hygiene and skin antiseptic. A timeout was performed prior to the initiation of the procedure. The L4-5 interspace was identified under fluoroscopy, corresponding to previous cross-sectional imaging. An appropriate skin entry site was determined. After local infiltration with 1% lidocaine, a 17 gauge trocar needle was advanced into the interspace from left posterolateral extraforaminal approach. Needle tip position within the interspace confirmed on biplane images. Less than 1 mL of very thick opaque viscous debris were aspirated, sent for the requested laboratory studies. The patient tolerated the procedure well. FLUOROSCOPY TIME:  1.7 minutes; [id number] DAP COMPLICATIONS: None immediate. IMPRESSION: Technically successful lumbar L4-5 disc aspiration under fluoroscopy. Electronically Signed   By: D Lucrezia Europe.D.   On: 06/17/2019 16:42   Ct Angio Abd/pel W/ And/or W/o  Result Date: 06/22/2019 CLINICAL DATA:  [age]ear old male with drop in hemoglobin and bilateral leg weakness. Aortic stent placed several weeks ago. EXAM: CTA ABDOMEN AND PELVIS WITHOUT AND WITH CONTRAST TECHNIQUE:  Multidetector CT imaging of the abdomen and pelvis was performed using the standard protocol during bolus administration of intravenous contrast. Multiplanar reconstructed images and MIPs were obtained and reviewed to evaluate the vascular anatomy. CONTRAST:  10039mMNIPAQUE IOHEXOL 350 MG/ML SOLN COMPARISON:  [facility] FINDINGS: VASCULAR Aorta: A thoracic aortic stent graft is again noted without interval change or adjacent hematoma. A 3.5 cm infrarenal suprailiac abdominal aortic aneurysm is again noted without dissection or periaortic hematoma. Celiac: Patent with moderate atherosclerotic plaque at the origin. SMA: Patent with mild atherosclerotic plaque at the origin Renals: Patent with atherosclerotic plaque at the origins IMA: Patent with atherosclerotic plaque at the origin Review of the MIP images confirms the above findings. NON-VASCULAR Lower chest: No acute abnormality. Hepatobiliary: The liver and gallbladder are unremarkable. No biliary dilatation. Pancreas: Unremarkable Spleen: Unremarkable Adrenals/Urinary Tract: Mildly atrophic kidneys noted with too small to characterize renal lesions again noted. Stomach/Bowel: There is no evidence of bowel obstruction. No definite bowel wall thickening identified. The appendix is not identified. A large amount of stool within the rectum is noted with mild circumferential rectal wall thickening. Lymphatic: No enlarged lymph nodes identified. Reproductive: No  definite abnormality Other: A small amount of ascites noted as well as edema within the subcutaneous, abdominal and pelvic fat. Moderate ill-defined fluid in the presacral/precoccygeal region is noted, significantly increased from 06/13/2019. Musculoskeletal: No acute or suspicious bony abnormality. Moderate degenerative disc disease, spondylosis and broad-based disc bulges contribute to moderate to severe central spinal and biforaminal narrowing at L4-5 and  mild to moderate central spinal and biforaminal narrowing at L5-S1. IMPRESSION: VASCULAR Unchanged appearance of the visualized aorta with thoracic aortic stent graft and 3.5 cm abdominal aortic aneurysm. No evidence of aortic dissection or periaortic hematoma/hemorrhage. NON-VASCULAR Increased edema within the subcutaneous, abdominal and pelvic fat. New moderate ill-defined fluid in the presacral/pre coccygeal space most likely represents edema. Small amount of ascites. Degenerative changes within the LOWER lumbar spine contributing to moderate to severe central spinal and biforaminal narrowing at L4-5 and mild to moderate central spinal and biforaminal narrowing at L5-S1. Large amount of rectal stool. No other acute abnormality identified. Electronically Signed   By: Margarette Canada M.D.   On: 06/22/2019 16:33    (Echo, Carotid, EGD, Colonoscopy, ERCP)    Subjective:   Discharge Exam: Vitals:   07/03/19 1030 07/03/19 1100  BP: (!) 160/88 (!) 169/88  Pulse: 83 86  Resp:    Temp:    SpO2:       General: Pt is alert, awake, not in acute distress Cardiovascular: RRR, S1/S2 +, no rubs, no gallops Respiratory: CTA bilaterally, no wheezing, no rhonchi Abdominal: Soft, NT, ND, bowel sounds + Extremities: no edema, no cyanosis    The results of significant diagnostics from this hospitalization (including imaging, microbiology, ancillary and laboratory) are listed below for reference.     Microbiology: No results found for this or any previous visit (from the past 240 hour(s)).   Labs: BNP (last 3 results) No results for input(s): BNP in the last 8760 hours. Basic Metabolic Panel: Recent Labs  Lab 06/27/19 0957 06/28/19 0518 06/30/19 0642 07/01/19 0711 07/03/19 0814  NA 132* 132* 131* 131* 131*  K 4.0 4.5 4.5 4.9 4.4  CL 95* 94* 93* 94* 93*  CO2  --  22 22 21* 22  GLUCOSE 89 93 97 99 88  BUN 30* 46* 62* 74* 45*  CREATININE 5.90* 7.98* 10.69* 13.24* 10.17*  CALCIUM  --  8.6*  8.4* 8.6* 8.8*  MG  --   --  1.9  --   --   PHOS  --   --   --  7.5* 6.7*   Liver Function Tests: Recent Labs  Lab 07/01/19 0711 07/03/19 0814  ALBUMIN 1.8* 1.9*   No results for input(s): LIPASE, AMYLASE in the last 168 hours. No results for input(s): AMMONIA in the last 168 hours. CBC: Recent Labs  Lab 06/30/19 0642 07/01/19 0610 07/01/19 0711 07/02/19 0439 07/03/19 0815  WBC 5.8 5.5 5.5 5.0 5.1  HGB 7.3* 7.2* 7.0* 8.5* 8.0*  HCT 22.0* 22.0* 21.9* 25.9* 24.9*  MCV 88.0 88.7 90.1 90.2 92.2  PLT 237 261 230 237 223   Cardiac Enzymes: No results for input(s): CKTOTAL, CKMB, CKMBINDEX, TROPONINI in the last 168 hours. BNP: Invalid input(s): POCBNP CBG: No results for input(s): GLUCAP in the last 168 hours. D-Dimer No results for input(s): DDIMER in the last 72 hours. Hgb A1c No results for input(s): HGBA1C in the last 72 hours. Lipid Profile No results for input(s): CHOL, HDL, LDLCALC, TRIG, CHOLHDL, LDLDIRECT in the last 72 hours. Thyroid function studies No results for input(s): TSH,  T4TOTAL, T3FREE, THYROIDAB in the last 72 hours.  Invalid input(s): FREET3 Anemia work up No results for input(s): VITAMINB12, FOLATE, FERRITIN, TIBC, IRON, RETICCTPCT in the last 72 hours. Urinalysis    Component Value Date/Time   COLORURINE YELLOW 08/04/2017 1824   APPEARANCEUR HAZY (A) 08/04/2017 1824   LABSPEC 1.009 08/04/2017 1824   PHURINE 9.0 (H) 08/04/2017 1824   GLUCOSEU 150 (A) 08/04/2017 1824   HGBUR NEGATIVE 08/04/2017 1824   BILIRUBINUR NEGATIVE 08/04/2017 1824   KETONESUR NEGATIVE 08/04/2017 1824   PROTEINUR >=300 (A) 08/04/2017 1824   NITRITE NEGATIVE 08/04/2017 1824   LEUKOCYTESUR NEGATIVE 08/04/2017 1824   Sepsis Labs Invalid input(s): PROCALCITONIN,  WBC,  LACTICIDVEN Microbiology No results found for this or any previous visit (from the past 240 hour(s)).   Time coordinating discharge: 40 minutes  SIGNED:   Elmarie Shiley, MD  [facility]

## 2019-07-03 NOTE — Progress Notes (Signed)
DISCHARGE NOTE [facility] to be discharged in patient rehab per MD order. Patient verbalized understanding.  Skin clean, dry and intact without evidence of skin break down, no evidence of skin tears noted. Patient refused all his schedule medications,nurse attempted to give it to him three times but he was refusing each time. Reminded patient that he  was transferring to rehab.Accepting nurse made aware of patient 's refusal of his scheduled medicines. No complaints noted.  Patient free of lines, drains, and wounds.   No skin issue noted  Dorothy, Zenon Mayo, RN

## 2019-07-03 NOTE — H&P (Deleted)
  The note originally documented on this encounter has been moved the the encounter in which it belongs.  

## 2019-07-03 NOTE — Progress Notes (Signed)
Inpatient Rehabilitation Admissions Coordinator  I met with patient in hemodialysis. He is in agreement to CIR admit and I have bed available. I will contact Dr. Kathe Mariner to confirm d/c to CIR today and make the arrangements to admit.  Danne Baxter, RN, MSN Rehab Admissions Coordinator [phone] 07/03/2019 9:33 AM
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
## 2019-07-03 NOTE — Progress Notes (Addendum)
Patient arrived from HD treatment at 320.Nurse tried three times to give his schedule medicines,patient refused all the time.He refused to eat his lunch meal.

## 2019-07-03 NOTE — Progress Notes (Signed)
[facility] Progress Note   Dialysis Orders: [facility] 4:15hr,400/1.5,87.5kg,2/2.25, Profile #2, R TDC, no heparin. - Parsabiv 2.5mg  IV q HD  - Calcitriol 1.52mcg PO q HD - home BP Rx > labetalol 300 bid/ amlodipine 10 hs/ hydralazine 100 tid/ losartan 100 qd/ minoxidil 5 bid  Assessment/Plan: 1. Spinal cord infarct/ paraperesis: per spinal MRI 8/23 at T9- T11, after complaints of not being able to move legs. 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 Neurosurgery consulted, no plan for intervention, recommend daily ASA. previous limited partiicipation with PT I think related to depression - really needs psychotherapy to deal with mobility deficits. Plan for transfer to CIR today. 2. L4-L5 lumbar discitis: s/p recent aortic stentgraft.BCx on admit negative. Per ID, will need 6 weeks of IV Vanc + Cefepime with HD (until 07/29/2019).  3. Hematochezia:GI consulted.Flex sig 9/1 showedstigmata of recent bleedinganddiffusely ulcerated rectum.EGD with fewnon-bleeding erosions in gastric antrum. Recommended miralax, carafate - try to limit < 2 weeks. Repeat Flex sig 9/4 withhemostatic spray to entire rectum.On Tetracycline + Flagyl as well for possible H.pylori. hgb 7 9/8 up to 8.5 after tranfusion 1 unit 9/8 . hgb down to 8 9/10 4. ESRD: Continue HD on TTS schedule - no heparin. K 4.4  5. RecentType B aortic dissection (s/p stent graft repair 8/14): Goal SBP per VVS 120-170. Known severe resistant HTN. Trying to avoid BP drops on HD. On ASA 6. Anemia(ESRD + ABLA): S/p7U PRBCs this admit. Continue max doseAranesp 200 mcg IVweekly (last 9/3). Transfuse prn 7. Secondary hyperparathyroidism -Ca/phos in goal. Continue calcitriol. Parsabiv not on hospital formulary.Binders changed to Fosrenol d/t concern that Auryxia irritating his bowels. 8. HTN/volume: Goal SBP per VVS 120-170.net UF 2.5 L post wt 86.5  9/8 Goal increased to 3.5  today due to low Na and ^ BP      Back on homeamlodipine,partialhydralazineandlabetalol (prior losartan/minoxidil remain on hold). 9.   Nutrition: Alb very low, resume supplements.- diet liberalized to regular  w/fluid restriction - encourage intake 10. Recent MSSA bacteremia/AVG infection: S/p AVG removal andAncefcoursecompleted. 11. Depression - offered chaplain services - declined - needs psychotherapy and possibly [place], PA-C [place] [phone] 07/03/2019,9:20 AM  LOS: 20 days   Subjective:   Seen on HD. No new issues Transferring to CIR today!  Objective Vitals:   07/03/19 0738 07/03/19 0800 07/03/19 0830 07/03/19 0900  BP: (!) 172/92 (!) 162/93 (!) 184/106 (!) 168/97  Pulse: 77 82 82 78  Resp:      Temp:      TempSrc:      SpO2:      Weight:      Height:       Physical Exam  General: slender somewhat frail Heart: RRR no murmur Lungs: grossly clear Abdomen: soft ND Extremities: no LE edema Dialysis Access:  Right TDC Qb 400   Additional Objective Labs: Basic Metabolic Panel: Recent Labs  Lab 06/30/19 0642 07/01/19 0711 07/03/19 0814  NA 131* 131* 131*  K 4.5 4.9 4.4  CL 93* 94* 93*  CO2 22 21* 22  GLUCOSE 97 99 88  BUN 62* 74* 45*  CREATININE 10.69* 13.24* 10.17*  CALCIUM 8.4* 8.6* 8.8*  PHOS  --  7.5* 6.7*   Liver Function Tests: Recent Labs  Lab 07/01/19 0711 07/03/19 0814  ALBUMIN 1.8* 1.9*   No results for input(s): LIPASE, AMYLASE in the last 168 hours. CBC: Recent Labs  Lab 06/30/19 [phone] 07/01/19  [id number] 07/01/19 0711 07/02/19 0439 07/03/19 0815  WBC 5.8 5.5 5.5 5.0 5.1  HGB 7.3* 7.2* 7.0* 8.5* 8.0*  HCT 22.0* 22.0* 21.9* 25.9* 24.9*  MCV 88.0 88.7 90.1 90.2 92.2  PLT 237 261 230 237 223   Blood Culture    Component Value Date/Time   SDES WOUND 06/17/2019 1512   SPECREQUEST L4 L5 DISC ASPIRATION 06/17/2019 1512   CULT  06/17/2019 1512    No growth aerobically or  anaerobically. Performed at [facility], [place] [address], [place], [place] [address]    REPTSTATUS 06/22/2019 FINAL 06/17/2019 1512    Cardiac Enzymes: No results for input(s): CKTOTAL, CKMB, CKMBINDEX, TROPONINI in the last 168 hours. CBG: No results for input(s): GLUCAP in the last 168 hours. Iron Studies: No results for input(s): IRON, TIBC, TRANSFERRIN, FERRITIN in the last 72 hours. Lab Results  Component Value Date   INR 1.3 (H) 06/17/2019   INR 1.3 (H) 06/05/2019   INR 1.3 (H) 06/03/2019   Studies/Results: No results found. Medications: . sodium chloride 250 mL (07/01/19 1637)  . sodium chloride    . sodium chloride    . ceFEPime (MAXIPIME) IV 2 g (07/01/19 1939)  . vancomycin Stopped (07/01/19 1107)   . sodium chloride   Intravenous Once  . amLODipine  10 mg Oral QHS  . aspirin EC  81 mg Oral Daily  . atorvastatin  20 mg Oral q1800  . bismuth subsalicylate  99991111 mg Oral QID  . darbepoetin (ARANESP) injection - DIALYSIS  150 mcg Intravenous Q Thu-HD  . hydrALAZINE  100 mg Oral Q8H  . labetalol  300 mg Oral BID  . lanthanum  1,000 mg Oral TID WC  . metroNIDAZOLE  250 mg Oral QID  . pantoprazole  40 mg Oral BID  . polyethylene glycol  17 g Oral Daily  . sodium chloride flush  3 mL Intravenous Q12H  . Sucralfate Enema (sucralfate 2g/sterile water 27ml enema)  2 g Rectal BID  . tetracycline  500 mg Oral QID

## 2019-07-03 NOTE — Procedures (Signed)
Patient seen on Hemodialysis. BP (!) 189/89   Pulse 80   Temp 98.5 F (36.9 C) (Oral)   Resp 18   Ht 6\' 4"  (1.93 m)   Wt 87 kg   SpO2 97%   BMI 23.35 kg/m   QB 400, UF goal 3L Tolerating treatment without complaints at this time.   Elmarie Shiley MD [facility]. Office # [phone] Pager # [phone] 10:37 AM

## 2019-07-04 ENCOUNTER — Observation Stay (HOSPITAL_COMMUNITY)
Admission: AD | Admit: 2019-07-04 | Discharge: 2019-07-04 | Disposition: A | Discharge status: Still In Hospital | Payer: Medicare Other | Source: Other Acute Inpatient Hospital | Attending: Internal Medicine | Admitting: Internal Medicine

## 2019-07-04 ENCOUNTER — Inpatient Hospital Stay (HOSPITAL_REHABILITATION)
Admission: RE | Admit: 2019-07-04 | Discharge: 2019-07-29 | Disposition: A | Discharge status: Home Health | Payer: Medicare Other | Source: Intra-hospital | Attending: Physical Medicine and Rehabilitation | Admitting: Physical Medicine and Rehabilitation

## 2019-07-04 ENCOUNTER — Inpatient Hospital Stay (HOSPITAL_COMMUNITY): Payer: Medicare Other

## 2019-07-04 ENCOUNTER — Inpatient Hospital Stay (HOSPITAL_COMMUNITY): Payer: Medicare Other | Admitting: Physical Therapy

## 2019-07-04 ENCOUNTER — Other Ambulatory Visit: Payer: Self-pay | Admitting: Internal Medicine

## 2019-07-04 ENCOUNTER — Inpatient Hospital Stay (HOSPITAL_COMMUNITY): Payer: Medicare Other | Admitting: Occupational Therapy

## 2019-07-04 ENCOUNTER — Encounter (HOSPITAL_COMMUNITY): Payer: Medicare Other

## 2019-07-04 DIAGNOSIS — D649 Anemia, unspecified: Secondary | ICD-10-CM

## 2019-07-04 DIAGNOSIS — R34 Anuria and oliguria: Secondary | ICD-10-CM | POA: Diagnosis present

## 2019-07-04 DIAGNOSIS — K592 Neurogenic bowel, not elsewhere classified: Secondary | ICD-10-CM | POA: Diagnosis present

## 2019-07-04 DIAGNOSIS — N319 Neuromuscular dysfunction of bladder, unspecified: Secondary | ICD-10-CM | POA: Diagnosis present

## 2019-07-04 DIAGNOSIS — I10 Essential (primary) hypertension: Secondary | ICD-10-CM

## 2019-07-04 DIAGNOSIS — Z720 Tobacco use: Secondary | ICD-10-CM

## 2019-07-04 DIAGNOSIS — M2142 Flat foot [pes planus] (acquired), left foot: Secondary | ICD-10-CM | POA: Diagnosis present

## 2019-07-04 DIAGNOSIS — D631 Anemia in chronic kidney disease: Secondary | ICD-10-CM | POA: Diagnosis present

## 2019-07-04 DIAGNOSIS — M2141 Flat foot [pes planus] (acquired), right foot: Secondary | ICD-10-CM | POA: Diagnosis present

## 2019-07-04 DIAGNOSIS — D62 Acute posthemorrhagic anemia: Secondary | ICD-10-CM | POA: Insufficient documentation

## 2019-07-04 DIAGNOSIS — K921 Melena: Secondary | ICD-10-CM | POA: Diagnosis present

## 2019-07-04 DIAGNOSIS — R7881 Bacteremia: Secondary | ICD-10-CM

## 2019-07-04 DIAGNOSIS — B1920 Unspecified viral hepatitis C without hepatic coma: Secondary | ICD-10-CM | POA: Insufficient documentation

## 2019-07-04 DIAGNOSIS — E1165 Type 2 diabetes mellitus with hyperglycemia: Secondary | ICD-10-CM | POA: Diagnosis not present

## 2019-07-04 DIAGNOSIS — D638 Anemia in other chronic diseases classified elsewhere: Secondary | ICD-10-CM | POA: Insufficient documentation

## 2019-07-04 DIAGNOSIS — K259 Gastric ulcer, unspecified as acute or chronic, without hemorrhage or perforation: Secondary | ICD-10-CM | POA: Diagnosis present

## 2019-07-04 DIAGNOSIS — B9681 Helicobacter pylori [H. pylori] as the cause of diseases classified elsewhere: Secondary | ICD-10-CM | POA: Insufficient documentation

## 2019-07-04 DIAGNOSIS — E8809 Other disorders of plasma-protein metabolism, not elsewhere classified: Secondary | ICD-10-CM | POA: Diagnosis present

## 2019-07-04 DIAGNOSIS — I150 Renovascular hypertension: Secondary | ICD-10-CM

## 2019-07-04 DIAGNOSIS — G8220 Paraplegia, unspecified: Secondary | ICD-10-CM | POA: Diagnosis present

## 2019-07-04 DIAGNOSIS — I120 Hypertensive chronic kidney disease with stage 5 chronic kidney disease or end stage renal disease: Secondary | ICD-10-CM | POA: Diagnosis present

## 2019-07-04 DIAGNOSIS — R252 Cramp and spasm: Secondary | ICD-10-CM | POA: Diagnosis present

## 2019-07-04 DIAGNOSIS — G9520 Unspecified cord compression: Secondary | ICD-10-CM | POA: Insufficient documentation

## 2019-07-04 DIAGNOSIS — F17210 Nicotine dependence, cigarettes, uncomplicated: Secondary | ICD-10-CM | POA: Insufficient documentation

## 2019-07-04 DIAGNOSIS — K254 Chronic or unspecified gastric ulcer with hemorrhage: Secondary | ICD-10-CM | POA: Diagnosis not present

## 2019-07-04 DIAGNOSIS — K626 Ulcer of anus and rectum: Secondary | ICD-10-CM | POA: Diagnosis present

## 2019-07-04 DIAGNOSIS — G629 Polyneuropathy, unspecified: Secondary | ICD-10-CM | POA: Diagnosis present

## 2019-07-04 DIAGNOSIS — M4626 Osteomyelitis of vertebra, lumbar region: Secondary | ICD-10-CM | POA: Diagnosis present

## 2019-07-04 DIAGNOSIS — N186 End stage renal disease: Secondary | ICD-10-CM

## 2019-07-04 DIAGNOSIS — G9511 Acute infarction of spinal cord (embolic) (nonembolic): Secondary | ICD-10-CM

## 2019-07-04 DIAGNOSIS — Z716 Tobacco abuse counseling: Secondary | ICD-10-CM

## 2019-07-04 DIAGNOSIS — K6289 Other specified diseases of anus and rectum: Secondary | ICD-10-CM | POA: Insufficient documentation

## 2019-07-04 DIAGNOSIS — I441 Atrioventricular block, second degree: Secondary | ICD-10-CM | POA: Diagnosis present

## 2019-07-04 DIAGNOSIS — E785 Hyperlipidemia, unspecified: Secondary | ICD-10-CM | POA: Diagnosis present

## 2019-07-04 DIAGNOSIS — M4646 Discitis, unspecified, lumbar region: Secondary | ICD-10-CM | POA: Insufficient documentation

## 2019-07-04 DIAGNOSIS — I700 Atherosclerosis of aorta: Secondary | ICD-10-CM | POA: Insufficient documentation

## 2019-07-04 DIAGNOSIS — Z8673 Personal history of transient ischemic attack (TIA), and cerebral infarction without residual deficits: Secondary | ICD-10-CM | POA: Insufficient documentation

## 2019-07-04 DIAGNOSIS — F329 Major depressive disorder, single episode, unspecified: Secondary | ICD-10-CM | POA: Insufficient documentation

## 2019-07-04 DIAGNOSIS — M4620 Osteomyelitis of vertebra, site unspecified: Secondary | ICD-10-CM

## 2019-07-04 DIAGNOSIS — K625 Hemorrhage of anus and rectum: Secondary | ICD-10-CM | POA: Diagnosis not present

## 2019-07-04 DIAGNOSIS — K449 Diaphragmatic hernia without obstruction or gangrene: Secondary | ICD-10-CM | POA: Insufficient documentation

## 2019-07-04 DIAGNOSIS — M792 Neuralgia and neuritis, unspecified: Secondary | ICD-10-CM

## 2019-07-04 DIAGNOSIS — Z7982 Long term (current) use of aspirin: Secondary | ICD-10-CM | POA: Insufficient documentation

## 2019-07-04 DIAGNOSIS — M4640 Discitis, unspecified, site unspecified: Secondary | ICD-10-CM | POA: Diagnosis present

## 2019-07-04 DIAGNOSIS — M4802 Spinal stenosis, cervical region: Secondary | ICD-10-CM | POA: Insufficient documentation

## 2019-07-04 DIAGNOSIS — I440 Atrioventricular block, first degree: Secondary | ICD-10-CM | POA: Diagnosis present

## 2019-07-04 DIAGNOSIS — I169 Hypertensive crisis, unspecified: Secondary | ICD-10-CM | POA: Diagnosis not present

## 2019-07-04 DIAGNOSIS — I714 Abdominal aortic aneurysm, without rupture: Secondary | ICD-10-CM | POA: Insufficient documentation

## 2019-07-04 DIAGNOSIS — Z992 Dependence on renal dialysis: Secondary | ICD-10-CM

## 2019-07-04 DIAGNOSIS — Z818 Family history of other mental and behavioral disorders: Secondary | ICD-10-CM

## 2019-07-04 DIAGNOSIS — Z79899 Other long term (current) drug therapy: Secondary | ICD-10-CM | POA: Insufficient documentation

## 2019-07-04 DIAGNOSIS — Z95828 Presence of other vascular implants and grafts: Secondary | ICD-10-CM

## 2019-07-04 DIAGNOSIS — Z8619 Personal history of other infectious and parasitic diseases: Secondary | ICD-10-CM

## 2019-07-04 DIAGNOSIS — Z20828 Contact with and (suspected) exposure to other viral communicable diseases: Secondary | ICD-10-CM | POA: Diagnosis present

## 2019-07-04 DIAGNOSIS — E1122 Type 2 diabetes mellitus with diabetic chronic kidney disease: Secondary | ICD-10-CM | POA: Diagnosis present

## 2019-07-04 DIAGNOSIS — Z8679 Personal history of other diseases of the circulatory system: Secondary | ICD-10-CM

## 2019-07-04 DIAGNOSIS — N2581 Secondary hyperparathyroidism of renal origin: Secondary | ICD-10-CM | POA: Diagnosis present

## 2019-07-04 DIAGNOSIS — M4714 Other spondylosis with myelopathy, thoracic region: Secondary | ICD-10-CM | POA: Diagnosis present

## 2019-07-04 DIAGNOSIS — D696 Thrombocytopenia, unspecified: Secondary | ICD-10-CM | POA: Diagnosis not present

## 2019-07-04 DIAGNOSIS — B379 Candidiasis, unspecified: Secondary | ICD-10-CM | POA: Diagnosis not present

## 2019-07-04 LAB — CBC
HCT: 26.5 % — ABNORMAL LOW (ref 39.0–52.0)
HCT: 27 % — ABNORMAL LOW (ref 39.0–52.0)
HCT: 27.5 % — ABNORMAL LOW (ref 39.0–52.0)
Hemoglobin: 8.5 g/dL — ABNORMAL LOW (ref 13.0–17.0)
Hemoglobin: 8.6 g/dL — ABNORMAL LOW (ref 13.0–17.0)
Hemoglobin: 8.8 g/dL — ABNORMAL LOW (ref 13.0–17.0)
MCH: 28.9 pg (ref 26.0–34.0)
MCH: 29.5 pg (ref 26.0–34.0)
MCH: 29.4 pg (ref 26.0–34.0)
MCHC: 31.5 g/dL (ref 30.0–36.0)
MCHC: 32.5 g/dL (ref 30.0–36.0)
MCHC: 32 g/dL (ref 30.0–36.0)
MCV: 90.8 fL (ref 80.0–100.0)
MCV: 91.8 fL (ref 80.0–100.0)
MCV: 92 fL (ref 80.0–100.0)
Platelets: 207 K/uL (ref 150–400)
Platelets: 223 K/uL (ref 150–400)
Platelets: 237 10*3/uL (ref 150–400)
RBC: 2.92 MIL/uL — ABNORMAL LOW (ref 4.22–5.81)
RBC: 2.94 MIL/uL — ABNORMAL LOW (ref 4.22–5.81)
RBC: 2.99 MIL/uL — ABNORMAL LOW (ref 4.22–5.81)
RDW: 16 % — ABNORMAL HIGH (ref 11.5–15.5)
RDW: 16 % — ABNORMAL HIGH (ref 11.5–15.5)
RDW: 16 % — ABNORMAL HIGH (ref 11.5–15.5)
WBC: 4.1 K/uL (ref 4.0–10.5)
WBC: 4.2 K/uL (ref 4.0–10.5)
WBC: 4.2 10*3/uL (ref 4.0–10.5)
nRBC: 0 % (ref 0.0–0.2)
nRBC: 0 % (ref 0.0–0.2)
nRBC: 0 % (ref 0.0–0.2)

## 2019-07-04 LAB — CBC WITH DIFFERENTIAL/PLATELET
Abs Immature Granulocytes: 0.03 10*3/uL (ref 0.00–0.07)
Basophils Absolute: 0 10*3/uL (ref 0.0–0.1)
Basophils Relative: 0 %
Eosinophils Absolute: 0.1 10*3/uL (ref 0.0–0.5)
Eosinophils Relative: 2 %
HCT: 26.7 % — ABNORMAL LOW (ref 39.0–52.0)
Hemoglobin: 8.6 g/dL — ABNORMAL LOW (ref 13.0–17.0)
Immature Granulocytes: 1 %
Lymphocytes Relative: 12 %
Lymphs Abs: 0.6 10*3/uL — ABNORMAL LOW (ref 0.7–4.0)
MCH: 29.6 pg (ref 26.0–34.0)
MCHC: 32.2 g/dL (ref 30.0–36.0)
MCV: 91.8 fL (ref 80.0–100.0)
Monocytes Absolute: 1 10*3/uL (ref 0.1–1.0)
Monocytes Relative: 21 %
Neutro Abs: 3.1 10*3/uL (ref 1.7–7.7)
Neutrophils Relative %: 64 %
Platelets: 209 10*3/uL (ref 150–400)
RBC: 2.91 MIL/uL — ABNORMAL LOW (ref 4.22–5.81)
RDW: 15.9 % — ABNORMAL HIGH (ref 11.5–15.5)
WBC: 4.8 10*3/uL (ref 4.0–10.5)
nRBC: 0 % (ref 0.0–0.2)

## 2019-07-04 LAB — RENAL FUNCTION PANEL
Albumin: 2 g/dL — ABNORMAL LOW (ref 3.5–5.0)
Anion gap: 12 (ref 5–15)
BUN: 22 mg/dL — ABNORMAL HIGH (ref 6–20)
CO2: 25 mmol/L (ref 22–32)
Calcium: 8.9 mg/dL (ref 8.9–10.3)
Chloride: 98 mmol/L (ref 98–111)
Creatinine, Ser: 6.75 mg/dL — ABNORMAL HIGH (ref 0.61–1.24)
GFR calc Af Amer: 9 mL/min — ABNORMAL LOW
GFR calc non Af Amer: 8 mL/min — ABNORMAL LOW
Glucose, Bld: 90 mg/dL (ref 70–99)
Phosphorus: 5.4 mg/dL — ABNORMAL HIGH (ref 2.5–4.6)
Potassium: 3.9 mmol/L (ref 3.5–5.1)
Sodium: 135 mmol/L (ref 135–145)

## 2019-07-04 LAB — TYPE AND SCREEN
ABO/RH(D): O POS
Antibody Screen: NEGATIVE

## 2019-07-04 MED ORDER — TRAMADOL HCL 50 MG PO TABS
50.0000 mg | ORAL_TABLET | Freq: Four times a day (QID) | ORAL | Status: DC | PRN
  Administered 2019-07-11: 10:00:00 50 mg via ORAL
  Filled 2019-07-04 (×4): qty 1

## 2019-07-04 MED ORDER — ATORVASTATIN CALCIUM 10 MG PO TABS: 20.0000 mg | ORAL_TABLET | Freq: Every day | ORAL | Status: DC

## 2019-07-04 MED ORDER — HYDRALAZINE HCL 50 MG PO TABS: 100.0000 mg | ORAL_TABLET | Freq: Three times a day (TID) | ORAL | Status: DC

## 2019-07-04 MED ORDER — PANTOPRAZOLE SODIUM 40 MG IV SOLR
40.0000 mg | Freq: Two times a day (BID) | INTRAVENOUS | Status: DC
  Administered 2019-07-04: 40 mg via INTRAVENOUS
  Filled 2019-07-04: qty 40

## 2019-07-04 MED ORDER — VANCOMYCIN HCL IN DEXTROSE 1-5 GM/200ML-% IV SOLN
1000.0000 mg | INTRAVENOUS | Status: DC
  Administered 2019-07-05 – 2019-07-17 (×5): 1000 mg via INTRAVENOUS
  Filled 2019-07-04 (×7): qty 200

## 2019-07-04 MED ORDER — POLYETHYLENE GLYCOL 3350 17 G PO PACK: 17.0000 g | PACK | Freq: Every day | ORAL | Status: DC

## 2019-07-04 MED ORDER — PANTOPRAZOLE SODIUM 40 MG IV SOLR
40.0000 mg | Freq: Two times a day (BID) | INTRAVENOUS | Status: DC
  Administered 2019-07-04 – 2019-07-11 (×15): 40 mg via INTRAVENOUS
  Filled 2019-07-04 (×16): qty 40

## 2019-07-04 MED ORDER — AMLODIPINE BESYLATE 10 MG PO TABS: 10.0000 mg | ORAL_TABLET | Freq: Every day | ORAL | Status: DC

## 2019-07-04 MED ORDER — METRONIDAZOLE 500 MG PO TABS
250.0000 mg | ORAL_TABLET | Freq: Three times a day (TID) | ORAL | Status: DC
  Administered 2019-07-04 – 2019-07-10 (×20): 250 mg via ORAL
  Filled 2019-07-04 (×23): qty 0.5

## 2019-07-04 MED ORDER — SODIUM CHLORIDE 0.9 % IV SOLN
2.0000 g | INTRAVENOUS | Status: DC
  Administered 2019-07-05 – 2019-07-26 (×10): 2 g via INTRAVENOUS
  Filled 2019-07-04 (×14): qty 2

## 2019-07-04 MED ORDER — ACETAMINOPHEN 325 MG PO TABS: 650.0000 mg | ORAL_TABLET | Freq: Four times a day (QID) | ORAL | Status: DC | PRN

## 2019-07-04 MED ORDER — SODIUM CHLORIDE 0.9 % IV SOLN: 100.0000 mL | INTRAVENOUS | Status: DC | PRN

## 2019-07-04 MED ORDER — NONFORMULARY OR COMPOUNDED ITEM
2.0000 g | Freq: Two times a day (BID) | Status: DC
  Administered 2019-07-04 – 2019-07-07 (×5): 2 g via RECTAL
  Filled 2019-07-04 (×9): qty 1

## 2019-07-04 MED ORDER — HEPARIN SODIUM (PORCINE) 1000 UNIT/ML DIALYSIS
1000.0000 [IU] | INTRAMUSCULAR | Status: DC | PRN
  Administered 2019-07-05: 18:00:00 3400 [IU] via INTRAVENOUS_CENTRAL
  Filled 2019-07-04 (×2): qty 1

## 2019-07-04 MED ORDER — TETRACYCLINE HCL 250 MG PO CAPS
500.0000 mg | ORAL_CAPSULE | Freq: Three times a day (TID) | ORAL | Status: DC
  Administered 2019-07-04 – 2019-07-10 (×19): 500 mg via ORAL
  Filled 2019-07-04 (×27): qty 2

## 2019-07-04 MED ORDER — HYDRALAZINE HCL 20 MG/ML IJ SOLN
2.0000 mg | INTRAMUSCULAR | Status: DC | PRN
  Administered 2019-07-04: 17:00:00 2 mg via INTRAVENOUS
  Filled 2019-07-04: qty 1

## 2019-07-04 MED ORDER — TRAMADOL HCL 50 MG PO TABS: 50.0000 mg | ORAL_TABLET | Freq: Four times a day (QID) | ORAL | Status: DC | PRN

## 2019-07-04 MED ORDER — ONDANSETRON HCL 4 MG/2ML IJ SOLN: 4.0000 mg | Freq: Four times a day (QID) | INTRAMUSCULAR | Status: DC | PRN

## 2019-07-04 MED ORDER — HYDROCODONE-ACETAMINOPHEN 7.5-325 MG PO TABS
1.0000 | ORAL_TABLET | Freq: Four times a day (QID) | ORAL | Status: DC | PRN
  Administered 2019-07-04 (×2): 1 via ORAL
  Filled 2019-07-04 (×2): qty 1

## 2019-07-04 MED ORDER — NITROGLYCERIN 0.4 MG SL SUBL: 0.4000 mg | SUBLINGUAL_TABLET | SUBLINGUAL | Status: DC | PRN

## 2019-07-04 MED ORDER — NONFORMULARY OR COMPOUNDED ITEM
2.0000 g | Freq: Two times a day (BID) | Status: DC
  Filled 2019-07-04 (×2): qty 1

## 2019-07-04 MED ORDER — TETRACYCLINE HCL 250 MG PO CAPS
500.0000 mg | ORAL_CAPSULE | Freq: Three times a day (TID) | ORAL | Status: DC
  Administered 2019-07-04 (×2): 500 mg via ORAL
  Filled 2019-07-04 (×5): qty 2

## 2019-07-04 MED ORDER — METRONIDAZOLE 250 MG PO TABS
250.0000 mg | ORAL_TABLET | Freq: Three times a day (TID) | ORAL | Status: DC
  Filled 2019-07-04 (×2): qty 1

## 2019-07-04 MED ORDER — PANTOPRAZOLE SODIUM 40 MG IV SOLR
40.0000 mg | Freq: Two times a day (BID) | INTRAVENOUS | Status: DC
  Filled 2019-07-04 (×2): qty 40

## 2019-07-04 MED ORDER — HYDROCODONE-ACETAMINOPHEN 7.5-325 MG PO TABS
1.0000 | ORAL_TABLET | Freq: Four times a day (QID) | ORAL | Status: DC | PRN
  Administered 2019-07-04 – 2019-07-05 (×3): 1 via ORAL
  Filled 2019-07-04 (×3): qty 1

## 2019-07-04 MED ORDER — ACETAMINOPHEN 325 MG PO TABS
650.0000 mg | ORAL_TABLET | Freq: Four times a day (QID) | ORAL | Status: DC | PRN
  Administered 2019-07-08 – 2019-07-09 (×2): 650 mg via ORAL
  Filled 2019-07-04 (×2): qty 2

## 2019-07-04 MED ORDER — ONDANSETRON HCL 4 MG PO TABS: 4.0000 mg | ORAL_TABLET | Freq: Four times a day (QID) | ORAL | Status: DC | PRN

## 2019-07-04 MED ORDER — LABETALOL HCL 200 MG PO TABS: 300.0000 mg | ORAL_TABLET | Freq: Two times a day (BID) | ORAL | Status: DC

## 2019-07-04 MED ORDER — ONDANSETRON HCL 4 MG PO TABS
4.0000 mg | ORAL_TABLET | Freq: Four times a day (QID) | ORAL | Status: DC | PRN
  Administered 2019-07-09: 13:00:00 4 mg via ORAL
  Filled 2019-07-04: qty 1

## 2019-07-04 MED ORDER — LANTHANUM CARBONATE 500 MG PO CHEW
1000.0000 mg | CHEWABLE_TABLET | Freq: Three times a day (TID) | ORAL | Status: DC
  Administered 2019-07-04 (×2): 1000 mg via ORAL
  Filled 2019-07-04 (×2): qty 2

## 2019-07-04 MED ORDER — ATORVASTATIN CALCIUM 10 MG PO TABS
20.0000 mg | ORAL_TABLET | Freq: Every day | ORAL | Status: DC
  Administered 2019-07-04 – 2019-07-28 (×25): 20 mg via ORAL
  Filled 2019-07-04 (×25): qty 2

## 2019-07-04 MED ORDER — PENTAFLUOROPROP-TETRAFLUOROETH EX AERO: 1.0000 "application " | INHALATION_SPRAY | CUTANEOUS | Status: DC | PRN

## 2019-07-04 MED ORDER — VANCOMYCIN HCL IN DEXTROSE 1-5 GM/200ML-% IV SOLN: 1000.0000 mg | INTRAVENOUS | Status: DC

## 2019-07-04 MED ORDER — LIDOCAINE-PRILOCAINE 2.5-2.5 % EX CREA: 1.0000 "application " | TOPICAL_CREAM | CUTANEOUS | Status: DC | PRN

## 2019-07-04 MED ORDER — ACETAMINOPHEN 650 MG RE SUPP: 650.0000 mg | Freq: Four times a day (QID) | RECTAL | Status: DC | PRN

## 2019-07-04 MED ORDER — ALBUTEROL SULFATE (2.5 MG/3ML) 0.083% IN NEBU: 2.5000 mg | INHALATION_SOLUTION | RESPIRATORY_TRACT | Status: DC | PRN

## 2019-07-04 MED ORDER — LANTHANUM CARBONATE 500 MG PO CHEW
1000.0000 mg | CHEWABLE_TABLET | Freq: Three times a day (TID) | ORAL | Status: DC
  Administered 2019-07-04 – 2019-07-29 (×64): 1000 mg via ORAL
  Filled 2019-07-04 (×76): qty 2

## 2019-07-04 MED ORDER — METRONIDAZOLE 500 MG PO TABS
250.0000 mg | ORAL_TABLET | Freq: Three times a day (TID) | ORAL | Status: DC
  Administered 2019-07-04 (×2): 250 mg via ORAL
  Filled 2019-07-04 (×3): qty 1

## 2019-07-04 MED ORDER — SODIUM CHLORIDE 0.9 % IV SOLN: 2.0000 g | INTRAVENOUS | Status: DC

## 2019-07-04 MED ORDER — LIDOCAINE HCL (PF) 1 % IJ SOLN: 5.0000 mL | INTRAMUSCULAR | Status: DC | PRN

## 2019-07-04 MED ORDER — SORBITOL 70 % SOLN: 30.0000 mL | Freq: Every day | Status: DC | PRN

## 2019-07-04 MED ORDER — ALTEPLASE 2 MG IJ SOLR
2.0000 mg | Freq: Once | INTRAMUSCULAR | Status: DC | PRN
  Filled 2019-07-04: qty 2

## 2019-07-04 MED FILL — Sucralfate Susp 1 GM/10ML: ORAL | Qty: 20 | Status: AC

## 2019-07-04 NOTE — Progress Notes (Signed)
Patient discharged to CIR. Report given to [facility].

## 2019-07-04 NOTE — Progress Notes (Signed)
Inpatient Rehabilitation Admissions Coordinator  I met with patient at bedside and he reluctantly agrees to readmit to inpt rehab today. He states he would do dialysis tomorrow.  Danne Baxter, RN, MSN Rehab Admissions Coordinator [phone] 07/04/2019 11:12 AM

## 2019-07-04 NOTE — Progress Notes (Signed)
Pharmacy Antibiotic Note  Frank Fuller is a 58 y.o. male admitted on 07/04/2019 with Osteomyelitis.  Pharmacy has been consulted for Vanco/Cefepime dosing.   ID: Abx for sepsis 2/2 discitis/osteo - planning 6 weeks thru 10/6. Tmax 101.3. WBC wnl. ESRD.  - Imaging concerning for vertebral discitis/osteo. ID recommends x 6 weeks abx until 07/29/19.   Vanc 8/21 >> (10/6) - 8/26 VR 34 mcg/ml >> hold next dose w/HD on 8/28, can resume dosing w/ HD on 8/29 - 9/3 VR pre-HD = 17, estimated post level 9.5  - 9/8 pre-HD VR = 13 mcg/ml >> give an extra 500 mg x 1, then cont 1g/HD-TTS Cefepime 8/21 >>(10/6) Flagyl 9/3 >> (9/17) for H.pylori TCN 9/3 >> (9/17) for H.pylori   8/23 BCx >> neg 8/25 IR disc aspirate >> neg   Plan: - Continue Vanc 1g/HD-TTS - Consider a weekly pre-HD VR - next 9/15 - Continue cefepime qHD TTS.  - ID recs 6 weeks IV Vanc + Cefepime     Temp (24hrs), Avg:99.2 F (37.3 C), Min:98.7 F (37.1 C), Max:99.7 F (37.6 C)  Recent Labs  Lab 06/28/19 0518  06/30/19 [id number] 07/01/19 0610 07/01/19 0711  07/03/19 0814 07/03/19 0815 07/04/19 0203 07/04/19 0756 07/04/19 0758 07/04/19 1132  WBC 7.7   < > 5.8 5.5 5.5   < >  --  5.1 4.8 4.1 4.2 4.2  CREATININE 7.98*  --  10.69*  --  13.24*  --  10.17*  --   --  6.75*  --   --   VANCORANDOM  --   --   --  13  --   --   --   --   --   --   --   --    < > = values in this interval not displayed.    Estimated Creatinine Clearance: 13.8 mL/min (A) (by C-G formula based on SCr of 6.75 mg/dL (H)).    Allergies  Allergen Reactions  . Oxycodone Nausea Only  . Pramoxine     Other reaction(s): Skin Rash  . Chlorhexidine Other (See Comments)    Unknown reaction Patch skin test done at dialysis 06/26/17  - staff using clear dressing and alcohol to clean exit site of catheter  . Clonidine Derivatives Other (See Comments)    Dizziness   . Fluorouracil     Other reaction(s): Skin Rash  . Lisinopril Other (See Comments)   unresponsive  . Sensipar  [Cinacalcet Hcl]     Other reaction(s): agitation  . Carvedilol Rash     Frank Fuller S. Alford Highland, PharmD, BCPS Clinical Staff Pharmacist Eilene Ghazi [facility] 07/04/2019 6:27 PM

## 2019-07-04 NOTE — Progress Notes (Signed)
PHYSICAL MEDICINE & REHABILITATION PROGRESS NOTE   Subjective/Complaints: Pt back to rehab today as an interrupted stay. He is down about some of the ongoing GI bleeding. Excited to get back to rehab to get stronger.    ROS: Patient denies fever, rash, sore throat, blurred vision, nausea, vomiting, diarrhea, cough, shortness of breath or chest pain, mild abdominal pain, headache, or mood change.     Objective:   Ct Abdomen Pelvis Wo Contrast  Result Date: 07/04/2019 CLINICAL DATA:  GI bleed EXAM: CT ABDOMEN AND PELVIS WITHOUT CONTRAST TECHNIQUE: Multidetector CT imaging of the abdomen and pelvis was performed following the standard protocol without IV contrast. COMPARISON:  June 22, 2019 FINDINGS: Lower chest: The visualized heart size within normal limits. No pericardial fluid/thickening. 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 A small hiatal hernia is seen. The visualized portions of the lungs are clear. Hepatobiliary: Although limited due to the lack of intravenous contrast, normal in appearance without gross focal abnormality. No evidence of calcified gallstones or biliary ductal dilatation. Pancreas:  Unremarkable.  No surrounding inflammatory changes. Spleen: Normal in size. Although limited due to the lack of intravenous contrast, normal in appearance. Adrenals/Urinary Tract: Both adrenal glands appear normal. Bilateral renal atrophy seen. Stomach/Bowel: The stomach and small bowel are grossly unremarkable. There is a moderate amount of colonic stool. Within the presacral and pre coccygeal space there is increased fat stranding, heterogeneous soft tissue density, and ill-defined mesenteric edema. No definite loculated fluid collection is seen. There appears to be circumferential rectal bowel wall thickening as well. Vascular/Lymphatic: There are no enlarged abdominal or pelvic lymph nodes. Scattered aortic atherosclerotic calcifications are seen without aneurysmal dilatation.  Reproductive: The prostate is unremarkable. Other: No evidence of abdominal wall mass or hernia.  No free air. Musculoskeletal: No acute or significant osseous findings. IMPRESSION: 1. Interval slight worsening in the presacral and pre coccygeal ill-defined heterogeneous edema and soft tissue density with circumferential rectal wall thickening. This is likely related to patient's gastrointestinal bleed. No definite loculated fluid collection Electronically Signed   By: Prudencio Pair M.D.   On: 07/04/2019 04:04   Recent Labs    07/04/19 0758 07/04/19 1132  WBC 4.2 4.2  HGB 8.5* 8.8*  HCT 27.0* 27.5*  PLT 223 237   Recent Labs    07/03/19 0814 07/04/19 0756  NA 131* 135  K 4.4 3.9  CL 93* 98  CO2 22 25  GLUCOSE 88 90  BUN 45* 22*  CREATININE 10.17* 6.75*  CALCIUM 8.8* 8.9   No intake or output data in the 24 hours ending 07/04/19 1746   Physical Exam: Vital Signs There were no vitals taken for this visit. Constitutional: He is oriented to person, place, and time. No distress.  HENT:  Head: Normocephalic and atraumatic.  Eyes: Pupils are equal, round, and reactive to light. EOM are normal.  Neck: Normal range of motion. No tracheal deviation present. No thyromegaly present.  Cardiovascular: Normal rate and regular rhythm. Exam reveals no friction rub.  Murmur heard. Respiratory: Effort normal and breath sounds normal. No respiratory distress. He has no wheezes. He has no rales.  GI: Soft. He exhibits no distension. There is no abdominal tenderness.  Musculoskeletal:        General: No edema.     Comments: Bilateral pes planus deformities.   Neurological: He is alert and oriented to person, place, and time. No cranial nerve deficit.  UE 4-5/5. LE: 3/5 HF, KE and 3+ to  4/5 ADF/PF. Sensation 1/2 below the umbilicus for LT, pain and proprioception. DTR's trace in all 4's.   Skin: He is not diaphoretic.  Psychiatric: Judgment and thought content normal.  Flat and disengaged but  opened up a bit as we talked.      Assessment/Plan: 1. Functional deficits secondary to T10 Spinal cord infarct which require 3+ hours per day of interdisciplinary therapy in a comprehensive inpatient rehab setting.  Physiatrist is providing close team supervision and 24 hour management of active medical problems listed below.  Physiatrist and rehab team continue to assess barriers to discharge/monitor patient progress toward functional and medical goals  Care Tool:  Bathing              Bathing assist       Upper Body Dressing/Undressing Upper body dressing        Upper body assist      Lower Body Dressing/Undressing Lower body dressing            Lower body assist       Toileting Toileting    Toileting assist       Transfers Chair/bed transfer  Transfers assist           Locomotion Ambulation   Ambulation assist              Walk 10 feet activity   Assist           Walk 50 feet activity   Assist           Walk 150 feet activity   Assist           Walk 10 feet on uneven surface  activity   Assist           Wheelchair     Assist               Wheelchair 50 feet with 2 turns activity    Assist            Wheelchair 150 feet activity     Assist          There were no vitals taken for this visit.  Medical Problem List and Plan: 1.  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 Pt transferred off last night d/t GIB last night.              -resume therapy and medical plan (interrupted stay)  -pt seems motivated to get started with PT, OT 2.  Antithrombotics:  -DVT/anticoagulation: SCDs.  LE vascular study requested             -antiplatelet therapy: Low-dose aspirin 81 mg daily 3. Pain Management: Oxycodone as needed             -prn tylenol             -prn robaxin for  spasms 4. Mood: Provide emotional support. Pt somewhat disheartened by multiple medical setbacks.              -antipsychotic agents: N/A 5. Neuropsych: This patient is capable of making decisions on his own behalf. 6. Skin/Wound Care: Routine skin checks 7. Fluids/Electrolytes/Nutrition: Routine in and outs with follow-up chemistries 8.  ID.  Continue vancomycin as well as Maxipime through 07/29/2019 per infectious disease 9.  Rectal ulcers/H. pylori.  Underwent EGD and flexible sigmoidoscopy 06/24/2019.  Follow-up per GI services.  Receiving Carafate enema x2 weeks as well as Flagyl x14  days initiated 06/26/2019 and tetracycline x14 days  -pt refused carafate enemas and developed some bleeding last night  -hgb stable however  - GI following and have discussed with pt re: continuing enemas  -continue to follow hgbs daily and if any significant bleeding recurs he will need flex sig per Dr. Doyne Keel note today  -pt is on board with this plan 10.  Acute on chronic normocytic anemia.  Patient did receive 2 units packed red blood cells 06/23/2019 and 1 unit PRBC 07/01/2019 and remains on Aranesp.  Follow CBC as above. 11.  End-stage renal disease.  Continue hemodialysis per renal services.             -HD after therapies to maximize rehab participation 12.  Thoracic aortic dissection.  Status post endovascular stent placement 06/06/2019.  Follow-up per Dr. Oneida Alar 13.  Neurogenic bowel bladder. I's and O's reviewed.   -He is essentially anuric.    -He is moving bowels, patterns somewhat skewed by GIB noted above. Continue to monitor for pattern 14.  Hypertension as well as history of first-degree AV block.  Norvasc 10 mg nightly, hydralazine 100 mg every 8 hours, labetalol 300 mg twice daily.  Monitor with increased mobility in therapies.  15.  Hyperlipidemia.  Lipitor 16.  Tobacco abuse.  Counseling    LOS: 0 days A FACE TO FACE EVALUATION WAS PERFORMED  [name] 07/04/2019, 5:46 PM

## 2019-07-04 NOTE — Progress Notes (Signed)
Daily Rounding Note  07/04/2019, 8:35 AM  LOS: 0 days   SUBJECTIVE:   Chief complaint: Recurrent rectal bleeding. Recurrence of large-volume rectal bleeding around 1 AM today, bed sheet soaked with blood.  No abdominal pain, tachycardia, hypotension, dizziness, breathing issues.  Continues to have chronic, diffuse, non-severe abdominal pain.  Also says there is some pain in his rectum. Prior to this morning, the patient had been having brown, small volume stool. Had refused Carafate enemas in the last 2 days.  When asked why the refusal, the patient said "it's a mental thing".  OBJECTIVE:         Vital signs in last 24 hours:    Temp:  [98.5 F (36.9 C)-101.3 F (38.5 C)] 99.7 F (37.6 C) (09/11 0814) Pulse Rate:  [75-88] 84 (09/11 0809) Resp:  [16-19] 16 (09/11 0809) BP: (127-189)/(67-103) 178/99 (09/11 0809) SpO2:  [93 %-99 %] 93 % (09/11 0809) Weight:  [84 kg] 84 kg (09/10 1205) Last BM Date: 07/04/19   General: No distress.   Not acutely ill looking.  Alert, comfortable in the bed. Heart: RRR. Chest: Clear bilaterally.  No labored breathing.  No cough. Abdomen: Diffuse, mild tenderness without guarding or rebound.  Active bowel sounds.  Not distended.  No HSM, masses. Extremities: No CCE. Neuro/Psych: Alert.  Oriented x3.  No gross deficits.  No tremors.  Intake/Output from previous day: No intake/output data recorded.  Intake/Output this shift: No intake/output data recorded.   Scheduled Meds:  atorvastatin  20 mg Oral q1800   lanthanum  1,000 mg Oral TID WC   metroNIDAZOLE  250 mg Oral TID AC & HS   pantoprazole (PROTONIX) IV  40 mg Intravenous Q12H   Sucralfate Enema (sucralfate 2g/sterile water 60 ml enema)  2 g Rectal BID   tetracycline  500 mg Oral TID AC & HS   Continuous Infusions:  [START ON 07/05/2019] ceFEPime (MAXIPIME) IV     [START ON 07/05/2019] vancomycin     PRN  Meds:.acetaminophen **OR** acetaminophen, albuterol, hydrALAZINE, HYDROcodone-acetaminophen, nitroGLYCERIN, ondansetron **OR** ondansetron (ZOFRAN) IV, traMADol   Lab Results: Recent Labs    07/02/19 0439 07/03/19 0815 07/04/19 0203  WBC 5.0 5.1 4.8  HGB 8.5* 8.0* 8.6*  HCT 25.9* 24.9* 26.7*  PLT 237 223 209   BMET Recent Labs    07/03/19 0814  NA 131*  K 4.4  CL 93*  CO2 22  GLUCOSE 88  BUN 45*  CREATININE 10.17*  CALCIUM 8.8*   LFT Recent Labs    07/03/19 0814  ALBUMIN 1.9*   PT/INR No results for input(s): LABPROT, INR in the last 72 hours. Hepatitis Panel No results for input(s): HEPBSAG, HCVAB, HEPAIGM, HEPBIGM in the last 72 hours.  Studies/Results: Ct Abdomen Pelvis Wo Contrast  Result Date: 07/04/2019 CLINICAL DATA:  GI bleed EXAM: CT ABDOMEN AND PELVIS WITHOUT CONTRAST TECHNIQUE: Multidetector CT imaging of the abdomen and pelvis was performed following the standard protocol without IV contrast. COMPARISON:  June 22, 2019 FINDINGS: Lower chest: The visualized heart size within normal limits. No pericardial fluid/thickening. 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 A small hiatal hernia is seen. The visualized portions of the lungs are clear. Hepatobiliary: Although limited due to the lack of intravenous contrast, normal in appearance without gross focal abnormality. No evidence of calcified gallstones or biliary ductal dilatation. Pancreas:  Unremarkable.  No surrounding inflammatory changes. Spleen: Normal in size. Although limited due to the lack  of intravenous contrast, normal in appearance. Adrenals/Urinary Tract: Both adrenal glands appear normal. Bilateral renal atrophy seen. Stomach/Bowel: The stomach and small bowel are grossly unremarkable. There is a moderate amount of colonic stool. Within the presacral and pre coccygeal space there is increased fat stranding, heterogeneous soft tissue density, and ill-defined mesenteric edema. No definite loculated  fluid collection is seen. There appears to be circumferential rectal bowel wall thickening as well. Vascular/Lymphatic: There are no enlarged abdominal or pelvic lymph nodes. Scattered aortic atherosclerotic calcifications are seen without aneurysmal dilatation. Reproductive: The prostate is unremarkable. Other: No evidence of abdominal wall mass or hernia.  No free air. Musculoskeletal: No acute or significant osseous findings. IMPRESSION: 1. Interval slight worsening in the presacral and pre coccygeal ill-defined heterogeneous edema and soft tissue density with circumferential rectal wall thickening. This is likely related to patient's gastrointestinal bleed. No definite loculated fluid collection Electronically Signed   By: Prudencio Pair M.D.   On: 07/04/2019 04:04    ASSESMENT:   *Stercoral procitis, rectal ulcers, hematochezia. Chronic abdominal pain predating this. Constipation.  8/30 CTAP with angiography: Large volume rectal stool.  Increased edema and subcu, abdominal and pelvic fat.  New, moderate, ill-defined presacral/pre-coccygeal space fluid likely edema.  Small amount of ascites.  3.5 cm infrarenal AAA.  Patent celiac artery, SMA, IMA. 06/24/2019 EGD: 3prominent cords in the mid and distalesophagus of unclear clinical significance.May beatypicalvarices(no indication of cirrhosis on CTAP angio of 8/30). Non-bleeding erosive gastropathy, bx'd. Congested duodenal mucosa, bx'd.  06/24/2019 flexible sigmoidoscopy: Rectal ulcers a/w recent fecal impaction.Pathology 06/27/2019 flexible sigmoidoscopy: Continuous area, near circumferential, 4 cm length, of nonbleeding, friable, ulcerated mucosa with bleeding stigmata in the distal rectum.  Old blood present.  Area treated with hemo-spray.   No bleeding at end of procedure. Pathology duodenum showed slight Brunner's gland hyperplasia.  Antral biopsies showed erosion, mild inflammation, stain negative for H. pylori.  Gastric biopsy showed  mildly active chronic Helicobacter pylori gastritis.  Sigmoid biopsy showed inflamed granulation tissue, differential included diverticulitis related ulceration and ischemia Rx: Miralax daily, BID carafate enemas Flagyl, bismuth, tcn added to BID PPI 9/3 for H Pylori.     CTAP today showing worsening (stercoral) proctitis  *Anemia. Acute on chronic dz anemia.  PRBCsx 7 8/30 - 9/8. Aranesp weekly. TID ferric citrate.  Hgb 7 >> 8.6 in last 3 days.   Stable Hgb 8.6 checked at 4-hour interval.  *ESRD, on HD TTS.  *Aortic dissection complicated by [id number] spinal cord infarct. 06/03/2019 endovascular stent graft.  *Hx CVA.On low dose ASA.  *    Lumbar discitis.  On 6 weeks Vanc/cefepime.    PLAN   *   Discontinue every 4 hours CBCs.  Recheck CBC in the morning.  *     Rehab MD ready to take pt back to CIR.   I Spoke with Dr. Havery Moros who feels no need for repeat flexible sigmoidoscopy.  Suggests return to rehab, observation for recurrent bleeding or dramatic decrease of Hgb.  I encouraged patient to accept Carafate enemas.   *     Resume renal diet.  Return to rehab.       Azucena Freed  07/04/2019, 8:35 AM Phone [phone]
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
## 2019-07-04 NOTE — Progress Notes (Signed)
Hospitalist came to evaluate the patient. She recommends that the patient be discharged to stepdown for further evaluations. She would also like the enema to be given before transfer.Stat Ct has been order. Will continue to monitor VS.

## 2019-07-04 NOTE — Discharge Summary (Signed)
Physician Discharge Summary  Patient ID: Frank Fuller MRN: [id number] DOB/AGE: 1961-04-28 58 y.o.  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 Pylori Acute on chronic normocytic anemia End-stage renal disease Thoracic aortic dissection Neurogenic bowel and bladder Hypertension Hyperlipidemia Tobacco abuse  Discharged Condition: Stable  Significant Diagnostic Studies: Ct Abdomen Pelvis Wo Contrast  Result Date: 07/04/2019 CLINICAL DATA:  GI bleed EXAM: CT ABDOMEN AND PELVIS WITHOUT CONTRAST TECHNIQUE: Multidetector CT imaging of the abdomen and pelvis was performed following the standard protocol without IV contrast. COMPARISON:  June 22, 2019 FINDINGS: Lower chest: The visualized heart size within normal limits. No pericardial fluid/thickening. 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 A small hiatal hernia is seen. The visualized portions of the lungs are clear. Hepatobiliary: Although limited due to the lack of intravenous contrast, normal in appearance without gross focal abnormality. No evidence of calcified gallstones or biliary ductal dilatation. Pancreas:  Unremarkable.  No surrounding inflammatory changes. Spleen: Normal in size. Although limited due to the lack of intravenous contrast, normal in appearance. Adrenals/Urinary Tract: Both adrenal glands appear normal. Bilateral renal atrophy seen. Stomach/Bowel: The stomach and small bowel are grossly unremarkable. There is a moderate amount of colonic stool. Within the presacral and pre coccygeal space there is increased fat stranding, heterogeneous soft tissue density, and ill-defined mesenteric edema. No definite loculated fluid collection is seen. There appears to be circumferential rectal bowel wall thickening as  well. Vascular/Lymphatic: There are no enlarged abdominal or pelvic lymph nodes. Scattered aortic atherosclerotic calcifications are seen without aneurysmal dilatation. Reproductive: The prostate is unremarkable. Other: No evidence of abdominal wall mass or hernia.  No free air. Musculoskeletal: No acute or significant osseous findings. IMPRESSION: 1. Interval slight worsening in the presacral and pre coccygeal ill-defined heterogeneous edema and soft tissue density with circumferential rectal wall thickening. This is likely related to patient's gastrointestinal bleed. No definite loculated fluid collection Electronically Signed   By: Prudencio Pair M.D.   On: 07/04/2019 04:04   Dg Chest 1 View  Result Date: 06/18/2019 CLINICAL DATA:  Shortness of breath, cough. EXAM: CHEST  1 VIEW COMPARISON:  Radiograph of June 15, 2019. FINDINGS: Stable cardiomediastinal silhouette. Status post stent graft repair of descending thoracic aorta. No pneumothorax or pleural effusion is noted. Right internal jugular catheter is unchanged in position. Minimal right basilar subsegmental atelectasis is noted. Increased left basilar opacity is noted concerning for worsening atelectasis. Bony thorax is unremarkable. IMPRESSION: Increased left basilar opacity is noted concerning for worsening atelectasis. Minimal right basilar subsegmental atelectasis is noted. Electronically Signed   By: Marijo Conception M.D.   On: 06/18/2019 10:56   Dg Abd 1 View  Result Date: 06/18/2019 CLINICAL DATA:  Abdominal pain. EXAM: ABDOMEN - 1 VIEW COMPARISON:  Radiographs of June 12, 2019. FINDINGS: Stable large amount of residual contrast and stool is seen throughout the colon. No significant small bowel dilatation is noted. No abnormal calcifications are noted. IMPRESSION: Stable large amount of residual contrast and stool is seen throughout the colon. No abnormal bowel dilatation is noted. Electronically Signed   By: Marijo Conception M.D.   On:  06/18/2019 10:59   Ct Head Wo Contrast  Result Date: 06/21/2019 CLINICAL DATA:  58 year old male with new onset seizure. EXAM: CT HEAD WITHOUT CONTRAST TECHNIQUE: Contiguous axial images were obtained from the  base of the skull through the vertex without intravenous contrast. COMPARISON:  01/10/2019 CT and prior studies FINDINGS: Brain: No evidence of acute infarction, hemorrhage, hydrocephalus, extra-axial collection or mass lesion/mass effect. Chronic small-vessel white matter ischemic changes and remote LEFT basal ganglia infarct again noted. Vascular: Carotid and vertebral atherosclerotic calcifications noted. Skull: Normal. Negative for fracture or focal lesion. Sinuses/Orbits: No acute finding. Other: None. IMPRESSION: 1. No evidence of acute intracranial abnormality. 2. Chronic small-vessel white matter ischemic changes and remote LEFT basal ganglia infarct. Electronically Signed   By: Margarette Canada M.D.   On: 06/21/2019 21:37   Ct Head Wo Contrast  Result Date: 06/15/2019 CLINICAL DATA:  Fatigue and malaise. Hypertension. End-stage renal disease. EXAM: CT HEAD WITHOUT CONTRAST TECHNIQUE: Contiguous axial images were obtained from the base of the skull through the vertex without intravenous contrast. COMPARISON:  MRI of the head 12/13/2016 FINDINGS: Brain: A remote lacunar infarct of the left caudate head is new, but not acute. Extensive periventricular white matter changes are otherwise stable. Confluent subcortical white matter hypoattenuation is also stable, right greater than left. No acute cortical infarct, hemorrhage, or mass lesion is present. The ventricles are proportionate to the degree of atrophy. No significant extraaxial fluid collection is present. The brainstem and cerebellum are within normal limits. Vascular: Atherosclerotic changes are noted within the cavernous internal carotid arteries bilaterally. There is no hyperdense vessel. Skull: Calvarium is intact. No focal lytic or blastic  lesions are present. No significant extracranial soft tissue lesion is present. Sinuses/Orbits: The paranasal sinuses and mastoid air cells are clear. The globes and orbits are within normal limits. IMPRESSION: 1. Left caudate head lacunar infarct is new since the prior study but appears remote. 2. Otherwise stable extensive periventricular and subcortical white matter changes bilaterally consistent chronic microvascular ischemia. 3. No acute intracranial abnormality. Electronically Signed   By: San Morelle M.D.   On: 06/15/2019 11:47   Mr Cervical Spine Wo Contrast  Result Date: 06/15/2019 CLINICAL DATA:  58 year old male with acute myelopathy. Known aortic dissection, recently treated with stenting. EXAM: MRI CERVICAL AND THORACIC SPINE WITHOUT CONTRAST TECHNIQUE: Multiplanar and multiecho pulse sequences of the cervical spine, to include the craniocervical junction and cervicothoracic junction, and the thoracic spine, were obtained without intravenous contrast. COMPARISON:  CTA CT Chest, Abdomen, and Pelvis 06/13/2019 and earlier. No prior cervical spine imaging, but brain MRI 12/13/2016. FINDINGS: MRI CERVICAL SPINE FINDINGS Alignment: Straightening of cervical lordosis. No spondylolisthesis. Vertebrae: Degenerative appearing endplate marrow signal changes at C5-C6 with faint marrow edema. No other marrow edema or evidence of acute osseous abnormality. Background bone marrow signal is within normal limits. Cord: Mass effect on the cervical spinal cord at C4-C5 and C5-C6. Is difficult to exclude abnormal cord signal at both levels such as due to myelomalacia due to compressive myelopathy (series 8, image 24 at C5-C6). And there is evidence of abnormal signal on series 8, image 24 greater on the left. Above and below those levels spinal cord signal and morphology is within normal limits. Posterior Fossa, vertebral arteries, paraspinal tissues: Cervicomedullary junction is within normal limits.  Negative visible posterior fossa. Preserved major vascular flow voids in the neck. The left vertebral artery appears dominant. Negative neck soft tissues. Disc levels: Degenerative changes notable for: C3-C4: Right eccentric circumferential disc osteophyte complex with mild to moderate right C4 foraminal stenosis. C4-C5: Circumferential disc osteophyte complex with broad-based posterior component. Spinal stenosis with mild to moderate spinal cord mass effect (series 8, image 20). Severe left  and moderate to severe right C5 foraminal stenosis. C5-C6: Disc space loss with circumferential disc osteophyte complex. Broad-based posterior component. Mild spinal stenosis and mild to moderate cord mass effect. Severe bilateral C6 foraminal stenosis. C6-C7: Circumferential disc osteophyte complex mostly affecting the neural foramina. Borderline to mild spinal stenosis without cord mass effect. Severe bilateral C7 foraminal stenosis. C7-T1: Mild to moderate facet hypertrophy greater on the left. Mild to moderate left C8 foraminal stenosis. MRI THORACIC SPINE FINDINGS Thoracic spine segmentation:  Normal on the comparison CT. Alignment:  Preserved thoracic kyphosis.  No spondylolisthesis. Vertebrae: Mild endplate irregularity including superiorly at T8 appears to be stable and chronic. STIR hyperintensity at the inferior T6 endplate appears to be related to a Schmorl's node (series 14, image 10). No acute osseous abnormality identified. Cord: Above T10 the thoracic spinal cord signal and morphology is within normal limits. Beginning at T9-T10 there is suggestion of abnormal increased central cord signal (series 16, image 31), although no definite cord expansion. By the mid T11 level the cord signal normalizes, and the visible conus appears normal, terminating at T12-L1. Fairly capacious thoracic spinal canal. Paraspinal and other soft tissues: Thoracic aortic dissection with evidence of stent redemonstrated on series 16, image  16. Left pleural effusion appears regressed. Negative visible upper abdominal viscera. There is patchy nonspecific T2 and STIR hyperintensity in the midthoracic erector spinal muscles in the midline posteriorly (series 14, image 10 and series 16, image 20). Little if any muscle expansion. Other paraspinal soft tissues are within normal limits. Disc levels: Endplate degeneration but no thoracic spinal stenosis. No discrete thoracic disc herniation. IMPRESSION: 1. 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 2. More questionably abnormal spinal cord at the T9-T10 and T10-T11 level where central cord signal abnormality without cord expansion might indicate a segmental cord infarct in this clinical setting. But the remaining thoracic spinal cord and conus appear normal. 3. Nonspecific mild bilateral posterior paraspinal muscle inflammation at the midthoracic level (T6 through T9). 4. Other cervical spine degeneration including up to severe multilevel cervical neural foraminal stenosis, and also mild spinal stenosis at C6-C7. 5. Thoracic aortic dissection and endograft re-demonstrated. Electronically Signed   By: Genevie Ann M.D.   On: 06/15/2019 17:20   Mr Thoracic Spine Wo Contrast  Result Date: 06/15/2019 CLINICAL DATA:  58 year old male with acute myelopathy. Known aortic dissection, recently treated with stenting. EXAM: MRI CERVICAL AND THORACIC SPINE WITHOUT CONTRAST TECHNIQUE: Multiplanar and multiecho pulse sequences of the cervical spine, to include the craniocervical junction and cervicothoracic junction, and the thoracic spine, were obtained without intravenous contrast. COMPARISON:  CTA CT Chest, Abdomen, and Pelvis 06/13/2019 and earlier. No prior cervical spine imaging, but brain MRI 12/13/2016. FINDINGS: MRI CERVICAL SPINE FINDINGS Alignment: Straightening of cervical lordosis. No  spondylolisthesis. Vertebrae: Degenerative appearing endplate marrow signal changes at C5-C6 with faint marrow edema. No other marrow edema or evidence of acute osseous abnormality. Background bone marrow signal is within normal limits. Cord: Mass effect on the cervical spinal cord at C4-C5 and C5-C6. Is difficult to exclude abnormal cord signal at both levels such as due to myelomalacia due to compressive myelopathy (series 8, image 24 at C5-C6). And there is evidence of abnormal signal on series 8, image 24 greater on the left. Above and below those levels spinal cord signal and morphology is within normal limits. Posterior Fossa, vertebral arteries, paraspinal tissues: Cervicomedullary junction is within normal limits.  Negative visible posterior fossa. Preserved major vascular flow voids in the neck. The left vertebral artery appears dominant. Negative neck soft tissues. Disc levels: Degenerative changes notable for: C3-C4: Right eccentric circumferential disc osteophyte complex with mild to moderate right C4 foraminal stenosis. C4-C5: Circumferential disc osteophyte complex with broad-based posterior component. Spinal stenosis with mild to moderate spinal cord mass effect (series 8, image 20). Severe left and moderate to severe right C5 foraminal stenosis. C5-C6: Disc space loss with circumferential disc osteophyte complex. Broad-based posterior component. Mild spinal stenosis and mild to moderate cord mass effect. Severe bilateral C6 foraminal stenosis. C6-C7: Circumferential disc osteophyte complex mostly affecting the neural foramina. Borderline to mild spinal stenosis without cord mass effect. Severe bilateral C7 foraminal stenosis. C7-T1: Mild to moderate facet hypertrophy greater on the left. Mild to moderate left C8 foraminal stenosis. MRI THORACIC SPINE FINDINGS Thoracic spine segmentation:  Normal on the comparison CT. Alignment:  Preserved thoracic kyphosis.  No spondylolisthesis. Vertebrae: Mild  endplate irregularity including superiorly at T8 appears to be stable and chronic. STIR hyperintensity at the inferior T6 endplate appears to be related to a Schmorl's node (series 14, image 10). No acute osseous abnormality identified. Cord: Above T10 the thoracic spinal cord signal and morphology is within normal limits. Beginning at T9-T10 there is suggestion of abnormal increased central cord signal (series 16, image 31), although no definite cord expansion. By the mid T11 level the cord signal normalizes, and the visible conus appears normal, terminating at T12-L1. Fairly capacious thoracic spinal canal. Paraspinal and other soft tissues: Thoracic aortic dissection with evidence of stent redemonstrated on series 16, image 16. Left pleural effusion appears regressed. Negative visible upper abdominal viscera. There is patchy nonspecific T2 and STIR hyperintensity in the midthoracic erector spinal muscles in the midline posteriorly (series 14, image 10 and series 16, image 20). Little if any muscle expansion. Other paraspinal soft tissues are within normal limits. Disc levels: Endplate degeneration but no thoracic spinal stenosis. No discrete thoracic disc herniation. IMPRESSION: 1. 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 2. More questionably abnormal spinal cord at the T9-T10 and T10-T11 level where central cord signal abnormality without cord expansion might indicate a segmental cord infarct in this clinical setting. But the remaining thoracic spinal cord and conus appear normal. 3. Nonspecific mild bilateral posterior paraspinal muscle inflammation at the midthoracic level (T6 through T9). 4. Other cervical spine degeneration including up to severe multilevel cervical neural foraminal stenosis, and also mild spinal stenosis at C6-C7. 5. Thoracic aortic dissection and endograft re-demonstrated.  Electronically Signed   By: Genevie Ann M.D.   On: 06/15/2019 17:20   Mr Lumbar Spine Wo Contrast  Result Date: 06/15/2019 CLINICAL DATA:  58 year old male with thoracic aortic dissection recently treated with stent/endograft. Myelopathy. Cervical and thoracic MRI earlier today. EXAM: MRI LUMBAR SPINE WITHOUT CONTRAST TECHNIQUE: Multiplanar, multisequence MR imaging of the lumbar spine was performed. No intravenous contrast was administered. COMPARISON:  Cervical and thoracic MRI earlier today. [place] stent CT Chest, Abdomen, and Pelvis. 06/13/2019 FINDINGS: Segmentation:  Normal. Alignment:  Stable lumbar lordosis. Vertebrae: Background bone marrow signal is within normal limits. There are degenerative endplate marrow signal changes at both L4-L5 and L5-S1, and there is rightward endplate marrow edema at L4-L5 (series 6, image 6), however there is some preserved fatty degenerative endplate marrow signal at both levels. See additional L4-L5 and L5-S1 details below. No  other acute osseous abnormality identified. No sacral edema and negative visible SI joints. Conus medullaris and cauda equina: Conus extends to the T12-L1 level, the lower thoracic spinal cord was described earlier today. Paraspinal and other soft tissues: There is abnormal prevertebral and retroperitoneal material in the lower abdomen and pelvis best seen on series 6, image 9. Some of this appears to be anterior to the aortoiliac bifurcation, but also is located in the lower lumbar and sacral prevertebral space. This seems to be new since the CT on 06/13/2019, but is of unclear etiology and significance. Furthermore, note that although the L4-L5 endplates demonstrate some marrow edema eccentric to the right, the adjacent right psoas muscle seems to remain normal on series 8, image 24. Both psoas muscles appear to remain normal throughout their course. Otherwise abnormal kidneys and fusiform aneurysmal enlargement of the abdominal aorta  are redemonstrated. The posterior paraspinal soft tissues appear negative. Disc levels: L1-L2: Mild L1 foraminal stenosis greater on the left related to disc bulging and posterior element hypertrophy. L2-L3: Multifactorial mild to moderate spinal stenosis related to circumferential disc bulge, posterior element hypertrophy, and epidural lipomatosis. Moderate left greater than right L3 foraminal stenosis. L3-L4: Moderate to severe multifactorial spinal stenosis related to circumferential disc bulge, posterior element hypertrophy and some epidural lipomatosis. See series 8, image 19. Moderate bilateral L3 foraminal stenosis. L4-L5: Increased T2 and STIR signal in the central and right disc. There was some vacuum phenomena here on the recent CT. Bulky underlying circumferential disc osteophyte complex eccentric to the right. Moderate posterior element hypertrophy. Mild spinal and moderate lateral recess stenosis. Mild to moderate left and severe right L4 foraminal stenosis. L5-S1: Similar increased T2 signal in the disc and bulky circumferential disc osteophyte complex although no convincing endplate marrow edema at this level. Mild epidural lipomatosis and posterior element hypertrophy. Moderate to severe bilateral L5 foraminal stenosis. IMPRESSION: 1. The dominant finding is new space-occupying material in the lower retroperitoneum and lumbosacral junction prevertebral space since the CT on 06/13/19. And although the L4-L5 disc and right endplates appear abnormal, the adjacent psoas muscle seem unaffected. However, in discussing this case just now with Dr. 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 Furthermore, we discussed that the hematocrit has been stable since 06/13/2019 - arguing against retroperitoneal hematoma. 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 No drainable fluid  collection/abscess is identified. 2. Underlying lower lumbar spine degeneration with multifactorial moderate to severe spinal and/or neural foraminal stenosis L3-L4 through L5-S1. 3. Abdominal aortic aneurysm and native renal atrophy partially redemonstrated. Electronically Signed   By: Genevie Ann M.D.   On: 06/15/2019 20:29   Dg Chest Port 1 View  Result Date: 06/15/2019 CLINICAL DATA:  Reason for exam: ESRD. Sepsis Hx HTN, pneumonia, wide-complex tachycardia EXAM: PORTABLE CHEST 1 VIEW COMPARISON:  CT of the chest on 06/13/2019 performed at [facility]. Chest x-ray on 06/04/2019 from [place]: Patient has RIGHT-sided dialysis catheter, tip overlying the level of the superior vena cava. Patient has had aortic stent graft since the previous chest x-ray. There is subsegmental atelectasis at both lung bases. CT exclude early LEFT LOWER lobe infiltrate. IMPRESSION: 1. Bibasilar atelectasis. 2. LEFT LOWER lobe infiltrate cannot be excluded. Electronically Signed   By: Nolon Nations M.D.   On: 06/15/2019 14:43   Dg Chest Port 1 View  Result Date: 06/04/2019 CLINICAL DATA:  Aortic dissection. EXAM: PORTABLE  CHEST 1 VIEW COMPARISON:  Chest x-ray from yesterday. FINDINGS: Unchanged tunneled right internal jugular dialysis catheter and left internal jugular central venous catheter. Stable cardiomediastinal silhouette and changes of aortic dissection with medial displacement of the aortic arch atherosclerotic calcifications. No focal consolidation, pleural effusion, or pneumothorax. No acute osseous abnormality. IMPRESSION: 1. Stable changes of known aortic dissection. Electronically Signed   By: Titus Dubin M.D.   On: 06/04/2019 08:41   Dg Abd Portable 1v  Result Date: 06/21/2019 CLINICAL DATA:  Abdominal pain. EXAM: PORTABLE ABDOMEN - 1 VIEW COMPARISON:  06/13/2019 FINDINGS: A large amount of colonic stool is again noted. No dilated small bowel loops are present. Aortic graft again noted.  IMPRESSION: Large amount of colonic stool. No evidence of small bowel obstruction. Electronically Signed   By: Margarette Canada M.D.   On: 06/21/2019 20:35   Vas [place] Upper Extremity Venous Duplex  Result Date: 06/09/2019 UPPER VENOUS STUDY  Indications: Edema Risk Factors: None identified. Comparison Study: No prior studies. Performing Technologist: Oliver Hum RVT  Examination Guidelines: A complete evaluation includes B-mode imaging, spectral Doppler, color Doppler, and power Doppler as needed of all accessible portions of each vessel. Bilateral testing is considered an integral part of a complete examination. Limited examinations for reoccurring indications may be performed as noted.  Right Findings: +----------+------------+---------+-----------+----------+-------+ RIGHT     CompressiblePhasicitySpontaneousPropertiesSummary +----------+------------+---------+-----------+----------+-------+ Subclavian    Full       Yes       Yes                      +----------+------------+---------+-----------+----------+-------+  Left Findings: +----------+------------+---------+-----------+----------+-------+ LEFT      CompressiblePhasicitySpontaneousPropertiesSummary +----------+------------+---------+-----------+----------+-------+ IJV           Full       Yes       Yes                      +----------+------------+---------+-----------+----------+-------+ Subclavian    Full       Yes       Yes                      +----------+------------+---------+-----------+----------+-------+ Axillary      Full       Yes       Yes                      +----------+------------+---------+-----------+----------+-------+ Brachial      Full       Yes       Yes                      +----------+------------+---------+-----------+----------+-------+ Radial        Full                                          +----------+------------+---------+-----------+----------+-------+ Ulnar          Full                                          +----------+------------+---------+-----------+----------+-------+ Cephalic      Full                                          +----------+------------+---------+-----------+----------+-------+  Basilic       Full                                          +----------+------------+---------+-----------+----------+-------+  Summary:  Right: No evidence of thrombosis in the subclavian.  Left: No evidence of deep vein thrombosis in the upper extremity. No evidence of superficial vein thrombosis in the upper extremity.  *See table(s) above for measurements and observations.  Diagnosing physician: Servando Snare MD Electronically signed by Servando Snare MD on 06/09/2019 at 2:17:27 PM.    Final    Hybrid Or Imaging (mc Only)  Result Date: 06/06/2019 There is no interpretation for this exam.  This order is for images obtained during a surgical procedure.  Please See "Surgeries" Tab for more information regarding the procedure.   Ir Lumbar Disc Aspiration W/img Guide  Result Date: 06/17/2019 INDICATION: Low back pain.  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 No drainable fluid collection/abscess is identified. EXAM: LUMBAR L4-5 DISC ASPIRATION UNDER FLUOROSCOPY MEDICATIONS: Lidocaine 1% subcutaneous ANESTHESIA/SEDATION: Intravenous Fentanyl 178mg and Versed 236mwere administered as conscious sedation during continuous monitoring of the patient's level of consciousness and physiological / cardiorespiratory status by the radiology RN, with a total moderate sedation time of 15 minutes. PROCEDURE: Informed written consent was obtained from the patient after a thorough discussion of the procedural risks, benefits and alternatives. All questions were addressed. Maximal Sterile Barrier Technique was utilized including caps, mask, sterile gowns, sterile gloves, sterile drape, hand hygiene and skin antiseptic. A timeout was  performed prior to the initiation of the procedure. The L4-5 interspace was identified under fluoroscopy, corresponding to previous cross-sectional imaging. An appropriate skin entry site was determined. After local infiltration with 1% lidocaine, a 17 gauge trocar needle was advanced into the interspace from left posterolateral extraforaminal approach. Needle tip position within the interspace confirmed on biplane images. Less than 1 mL of very thick opaque viscous debris were aspirated, sent for the requested laboratory studies. The patient tolerated the procedure well. FLUOROSCOPY TIME:  1.7 minutes; [id number] DAP COMPLICATIONS: None immediate. IMPRESSION: Technically successful lumbar L4-5 disc aspiration under fluoroscopy. Electronically Signed   By: D Lucrezia Europe.D.   On: 06/17/2019 16:42   Ct Angio Abd/pel W/ And/or W/o  Result Date: 06/22/2019 CLINICAL DATA:  [age]ear old male with drop in hemoglobin and bilateral leg weakness. Aortic stent placed several weeks ago. EXAM: CTA ABDOMEN AND PELVIS WITHOUT AND WITH CONTRAST TECHNIQUE: Multidetector CT imaging of the abdomen and pelvis was performed using the standard protocol during bolus administration of intravenous contrast. Multiplanar reconstructed images and MIPs were obtained and reviewed to evaluate the vascular anatomy. CONTRAST:  1008mMNIPAQUE IOHEXOL 350 MG/ML SOLN COMPARISON:  [facility] FINDINGS: VASCULAR Aorta: A thoracic aortic stent graft is again noted without interval change or adjacent hematoma. A 3.5 cm infrarenal suprailiac abdominal aortic aneurysm is again noted without dissection or periaortic hematoma. Celiac: Patent with moderate atherosclerotic plaque at the origin. SMA: Patent with mild atherosclerotic plaque at the origin Renals: Patent with atherosclerotic plaque at the origins IMA: Patent with atherosclerotic plaque at the origin Review of the MIP images confirms the above  findings. NON-VASCULAR Lower chest: No acute abnormality. Hepatobiliary: The liver and gallbladder are unremarkable. No biliary dilatation. Pancreas: Unremarkable Spleen: Unremarkable Adrenals/Urinary Tract: Mildly atrophic kidneys noted with  too small to characterize renal lesions again noted. Stomach/Bowel: There is no evidence of bowel obstruction. No definite bowel wall thickening identified. The appendix is not identified. A large amount of stool within the rectum is noted with mild circumferential rectal wall thickening. Lymphatic: No enlarged lymph nodes identified. Reproductive: No definite abnormality Other: A small amount of ascites noted as well as edema within the subcutaneous, abdominal and pelvic fat. Moderate ill-defined fluid in the presacral/precoccygeal region is noted, significantly increased from 06/13/2019. Musculoskeletal: No acute or suspicious bony abnormality. Moderate degenerative disc disease, spondylosis and broad-based disc bulges contribute to moderate to severe central spinal and biforaminal narrowing at L4-5 and mild to moderate central spinal and biforaminal narrowing at L5-S1. IMPRESSION: VASCULAR Unchanged appearance of the visualized aorta with thoracic aortic stent graft and 3.5 cm abdominal aortic aneurysm. No evidence of aortic dissection or periaortic hematoma/hemorrhage. NON-VASCULAR Increased edema within the subcutaneous, abdominal and pelvic fat. New moderate ill-defined fluid in the presacral/pre coccygeal space most likely represents edema. Small amount of ascites. Degenerative changes within the LOWER lumbar spine contributing to moderate to severe central spinal and biforaminal narrowing at L4-5 and mild to moderate central spinal and biforaminal narrowing at L5-S1. Large amount of rectal stool. No other acute abnormality identified. Electronically Signed   By: Margarette Canada M.D.   On: 06/22/2019 16:33    Labs:  Basic Metabolic Panel: Recent Labs  Lab  06/27/19 0957 06/28/19 0518 06/30/19 0642 07/01/19 0711 07/03/19 0814  NA 132* 132* 131* 131* 131*  K 4.0 4.5 4.5 4.9 4.4  CL 95* 94* 93* 94* 93*  CO2  --  22 22 21* 22  GLUCOSE 89 93 97 99 88  BUN 30* 46* 62* 74* 45*  CREATININE 5.90* 7.98* 10.69* 13.24* 10.17*  CALCIUM  --  8.6* 8.4* 8.6* 8.8*  MG  --   --  1.9  --   --   PHOS  --   --   --  7.5* 6.7*    CBC: Recent Labs  Lab 07/02/19 0439 07/03/19 0815 07/04/19 0203  WBC 5.0 5.1 4.8  NEUTROABS  --   --  3.1  HGB 8.5* 8.0* 8.6*  HCT 25.9* 24.9* 26.7*  MCV 90.2 92.2 91.8  PLT 237 223 209    CBG: No results for input(s): GLUCAP in the last 168 hours.  Family history.  Mother with hypertension father with hypertension.  Denies colon cancer Brief HPI:   Frank Fuller is a 58 y.o. right-handed male with history of hypertension, tobacco abuse and end-stage renal disease with hemodialysis and recent MSSA bacteremia/AVG infection status post revision 03/12/2019 then with partial resection 04/12/2019, first-degree AV block, AAA status post stenting, chronic anemia, hepatitis C and CVA.  Per chart review lives alone independent prior to admission he has a brother and sister in the area.  Patient with long complicated medical history recently hospitalized 06/03/2019 to 06/12/2019 for dissection of thoracic aorta type B treated with endovascular stent grafting with spinal drain per Dr. Oneida Alar on 06/06/2019.  He was discharged home ambulating 60 feet modified independent but immediately returned with complaints of back and abdominal pain with fever as well as lower extremity weakness and paraplegia.  Initially presented 06/13/2019 to [facility] with fever of 102.4, ESR 138, blood pressure 192/96, heart rate 107, WBC 17,900, hemoglobin 10.2, sodium 132, creatinine 5.8, COVID negative.  CT angiogram of the chest and abdomen pelvis obtained showed interval repair of thoracic aortic dissection slight increase in left  pleural effusion improved  right lung nodular airspace disease likely infectious generalized ileus and slow bowel transient and large colonic stool burden with mild rectal wall thickening.  Placed on broad-spectrum antibiotics transferred to [facility] for further evaluation.  Cranial CT scan showed left caudate head lacunar infarct new since prior study but appeared remote.  No other acute abnormalities.  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  More questionably abnormal spinal cord at T 9-10 and 10-11 where central cord signal abnormality without cord expansion might indicate a segmental cord infarct and clinical setting.  Nonspecific mild bilateral posterior paraspinal muscle inflammation at the midthoracic level T6-9.  MRI lumbar spine showed new space-occupying material in the lower retroperitoneum and sub-sacral junction prevertebral space since CT of 06/13/2019 favoring acute discitis osteomyelitis.  No drainable fluid collection abscess identified.  Neurosurgery Dr. Arnoldo Morale consulted no surgical intervention.  Patient did undergo disc aspiration per interventional radiology 06/18/2019 with culture negative.  Placed on 6-week antibiotic course with vancomycin and cefepime for 6 weeks to and 07/29/2019.  Hospital course complicated by multiple episodes of bloody stool with clots FOBT positive with acute blood loss anemia.  Gastroenterology consulted underwent EGD and flexible sigmoidoscopy 06/24/2019 with findings of rectal ulceration likely due to recent impaction and underwent single session of hemo-spray 06/27/2019.  Patient's hemoglobin noted to be 6.1-6.994 2020 he did receive 2 units packed red blood cells 06/23/2019 and 1 unit packed red blood cells 07/01/2019 with latest hemoglobin 8.0.  Patient placed on therapy for H. pylori with tetracycline and Flagyl x14 days.  Patient  was admitted for a comprehensive rehab program   Hospital Course: Josearmando Kuhnert was admitted to rehab 07/04/2019 for inpatient therapies to consist of PT, ST and OT at least three hours five days a week. Past admission physiatrist, therapy team and rehab RN have worked together to provide customized collaborative inpatient rehab.  Patient is just been admitted to inpatient rehab services therapies yet to take place.  In the early morning of 07/04/2019 patient with recurrent rectal bleeding with clots with hospitalist team consulted regarding input as well as gastroenterology.  Follow-up CBC 8.0.  Situation discussed with GI Dr. Henrene Pastor who initially recommended supportive care as patient with noted rectal ulcer.  There were reports of patient apparently had refused Carafate enema which initially had been ordered.  Rapid response consulted in regards to bleeding.  CT of abdomen pelvis showed interval slight worsening in the presacral and pre-coccygeal ill-defined heterogeneous edema and soft tissue density with circumferential rectal wall thickening.  No definite loculated fluid collection.  Hospitalist team and recommended discharge to acute care services for further evaluation.   Blood pressures were monitored on TID basis and remained monitored    Rehab course: During patient's stay in rehab weekly team conferences were held to monitor patient's progress, set goals and discuss barriers to discharge. At admission, patient required max assist for sit to stand, +2 physical assist supine to sit, +2 physical assist sit to supine, minimal guard rolling         Disposition: Discharged to acute care services in guarded condition    Diet: N.p.o.  Special Instructions: As per hospitalist team      Signed: Lavon Paganini Poinsett 07/04/2019, 5:31 AM

## 2019-07-04 NOTE — Discharge Summary (Signed)
Physician Discharge Summary  Robson Henschen [id number] DOB: 07-12-1961 DOA: 07/04/2019  PCP: [facility]  Admit date: 07/04/2019 Discharge date: 07/04/2019  Admitted From: CIR Disposition: CIR  Recommendations for Outpatient Follow-up:  1. Follow up with CIR provider at earliest convenience 2. Outpatient follow-up with GI 3. Continue dialysis as per nephrology schedule 4. Outpatient follow-up with neurology and infectious diseases 5. Needs IV antibiotics till 07/29/2019 6. 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  Neurology has evaluated the patient and recommended baby aspirin with outpatient follow-up with neurology.  Hospital course was complicated with multiple episodes of bloody bowel movement with clots requiring total of 5 units of packed red cells transfusion for which he underwent endoscopy and flexible sigmoidoscopy on 06/24/2019 with findings of rectal ulceration.  Patient was discharged on 07/03/2019 to CIR on IV vancomycin and cefepime till 07/29/2019 as per ID along with H. pylori treatment and Carafate enema.  Patient was transferred back from CIR to inpatient on 07/04/2019 for  rectal bleeding.  GI reevaluated the patient.  He did not have any significant drop in hemoglobin.  No further bleeding since his admission.  GI has cleared the patient to be discharged back to CIR.  Discharge Diagnoses:  Recurrent rectal bleeding in a patient with stercoral proctitis and rectal ulcers -During his recent hospitalization, patient had EGD and flexible sigmoidoscopy and had required 4 units of packed red cells transfusion.  He was discharged on 07/03/2019 on Carafate enema  -GI has evaluated the patient.  Hemoglobin did not drop significantly.  GI has recommended conservative management to continue daily MiraLAX with twice a day Carafate enemas.  GI has cleared the patient for discharge.  He will be discharged back to CIR.  Monitor hemoglobin and CIR. -GI is okay to continue aspirin  Chronic H. pylori gastritis -As seen on recent EGD and gastric biopsy.  Continue Flagyl, bismuth, tetracycline and PPI.  Outpatient follow-up with GI  Anemia of chronic disease -Hemoglobin stable currently.  Monitor  End-stage renal disease on hemodialysis -Continue dialysis as per nephrology schedule.  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 Abnormal spinal cord at T8-9 T10 T10-T11 with possible segmental cord infarct.  Neurosurgery was consulted during recent hospitalization and no intervention was planned.  Neurology recommended daily baby aspirin -Continue baby aspirin.  Outpatient follow-up with neurology.  Recent diagnosis of vertebral discitis/osteomyelitis -Recently admitted for vertebral discitis/osteomyelitis.  MRI of the lumbar spine done during recent hospitalization showing numerous space-occupying material in the lower retroperitoneum and lumbar sacral junction. Favor acute discitis and  osteomyelitis at L4-L5 with prevertebral phlegmon.  Cultures were negative.  IR was consulted during recent hospitalization and patient underwent disc aspiration on 06/17/19,  culture negative.  ID was consulted and recommended 6 weeks of IV vancomycin and cefepime with hemodialysis, end day 07/29/2019. -Continue IV vancomycin and cefepime -Outpatient follow-up with ID.  Follow-up labs while on antibiotics.  Hypertension--monitor blood pressure.  Continue current antihypertensive regimen.  Discharge Instructions  Discharge Instructions    Diet - low sodium heart healthy   Complete by: As directed    Increase activity slowly   Complete by: As directed      Allergies as of 07/04/2019      Reactions   Oxycodone Nausea Only   Chlorhexidine Other (See Comments)   Unknown reaction Patch skin test done at dialysis 06/26/17  - staff using clear dressing and alcohol to clean exit site of catheter   Clonidine Derivatives Other (See Comments)   Dizziness    Lisinopril Other (See Comments)   unresponsive   Carvedilol Rash      Medication List    TAKE these medications   amLODipine 10 MG tablet Commonly known as: NORVASC Take 1 tablet (10 mg total) by mouth at bedtime.   aspirin 81 MG EC tablet Take 1 tablet (81 mg total) by mouth daily.   atorvastatin 20 MG tablet Commonly known as: LIPITOR Take 1 tablet (20 mg total) by mouth daily at 6 PM.   bismuth subsalicylate 99991111 MG chewable tablet Commonly known as: PEPTO BISMOL Chew 1 tablet (262 mg total) by mouth 4 (four) times daily for 14 days.   ceFEPIme 2 g in sodium chloride 0.9 % 100 mL Inject 2 g into the vein every Tuesday, Thursday, and Saturday at 6 PM.   Darbepoetin Alfa 150 MCG/0.3ML Sosy injection Commonly known as: ARANESP Inject 0.3 mLs (150 mcg total) into the vein every Saturday with hemodialysis.   hydrALAZINE 100 MG tablet Commonly known as: APRESOLINE Take 1 tablet (100 mg total) by mouth 3 (three) times daily. Hold  for systolic blood pressure [id number]   labetalol 300 MG tablet Commonly known as: NORMODYNE Take 1 tablet (300 mg total) by mouth 2 (two) times daily.   lanthanum 1000 MG chewable tablet Commonly known as: FOSRENOL Chew 1 tablet (1,000 mg total) by mouth 3 (three) times daily with meals.   lidocaine-prilocaine cream Commonly known as: EMLA Apply 1 application topically See admin instructions. Apply topically three times week (Tuesday, Thursday, Saturday) prior to port access   metroNIDAZOLE 250 MG tablet Commonly known as: FLAGYL Take 1 tablet (250 mg total) by mouth 4 (four) times daily for 14 days.   nitroGLYCERIN 0.4 MG SL tablet Commonly known as: NITROSTAT Place 1 tablet (0.4 mg total) under the tongue every 5 (five) minutes as needed for chest pain.   NONFORMULARY OR COMPOUNDED ITEM Place 2 g rectally 2 (two) times daily for 12 days.   pantoprazole 40 MG tablet Commonly known as: PROTONIX Take 1 tablet (40 mg total) by mouth 2 (two) times daily for 14 days.   polyethylene glycol 17 g packet Commonly known as: MIRALAX / GLYCOLAX Take 17 g by mouth daily.   tetracycline 500 MG capsule Commonly known as: SUMYCIN Take 1 capsule (500 mg total) by mouth 4 (four) times daily for 14 days.   vancomycin 1-5 GM/200ML-% Soln Commonly known as: VANCOCIN Inject 200 mLs (1,000 mg total) into the vein Every Tuesday,Thursday,and Saturday with dialysis.       Allergies  Allergen Reactions  . Oxycodone Nausea Only  . Chlorhexidine Other (See Comments)    Unknown reaction Patch skin test done at  dialysis 06/26/17  - staff using clear dressing and alcohol to clean exit site of catheter  . Clonidine Derivatives Other (See Comments)    Dizziness   . Lisinopril Other (See Comments)    unresponsive  . Carvedilol Rash    Consultations:  GI/nephrology   Procedures/Studies: Ct Abdomen Pelvis Wo Contrast  Result Date: 07/04/2019 CLINICAL DATA:  GI bleed EXAM: CT ABDOMEN AND  PELVIS WITHOUT CONTRAST TECHNIQUE: Multidetector CT imaging of the abdomen and pelvis was performed following the standard protocol without IV contrast. COMPARISON:  June 22, 2019 FINDINGS: Lower chest: The visualized heart size within normal limits. No pericardial fluid/thickening. 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 A small hiatal hernia is seen. The visualized portions of the lungs are clear. Hepatobiliary: Although limited due to the lack of intravenous contrast, normal in appearance without gross focal abnormality. No evidence of calcified gallstones or biliary ductal dilatation. Pancreas:  Unremarkable.  No surrounding inflammatory changes. Spleen: Normal in size. Although limited due to the lack of intravenous contrast, normal in appearance. Adrenals/Urinary Tract: Both adrenal glands appear normal. Bilateral renal atrophy seen. Stomach/Bowel: The stomach and small bowel are grossly unremarkable. There is a moderate amount of colonic stool. Within the presacral and pre coccygeal space there is increased fat stranding, heterogeneous soft tissue density, and ill-defined mesenteric edema. No definite loculated fluid collection is seen. There appears to be circumferential rectal bowel wall thickening as well. Vascular/Lymphatic: There are no enlarged abdominal or pelvic lymph nodes. Scattered aortic atherosclerotic calcifications are seen without aneurysmal dilatation. Reproductive: The prostate is unremarkable. Other: No evidence of abdominal wall mass or hernia.  No free air. Musculoskeletal: No acute or significant osseous findings. IMPRESSION: 1. Interval slight worsening in the presacral and pre coccygeal ill-defined heterogeneous edema and soft tissue density with circumferential rectal wall thickening. This is likely related to patient's gastrointestinal bleed. No definite loculated fluid collection Electronically Signed   By: Prudencio Pair M.D.   On: 07/04/2019 04:04   Dg Chest 1  View  Result Date: 06/18/2019 CLINICAL DATA:  Shortness of breath, cough. EXAM: CHEST  1 VIEW COMPARISON:  Radiograph of June 15, 2019. FINDINGS: Stable cardiomediastinal silhouette. Status post stent graft repair of descending thoracic aorta. No pneumothorax or pleural effusion is noted. Right internal jugular catheter is unchanged in position. Minimal right basilar subsegmental atelectasis is noted. Increased left basilar opacity is noted concerning for worsening atelectasis. Bony thorax is unremarkable. IMPRESSION: Increased left basilar opacity is noted concerning for worsening atelectasis. Minimal right basilar subsegmental atelectasis is noted. Electronically Signed   By: Marijo Conception M.D.   On: 06/18/2019 10:56   Dg Abd 1 View  Result Date: 06/18/2019 CLINICAL DATA:  Abdominal pain. EXAM: ABDOMEN - 1 VIEW COMPARISON:  Radiographs of June 12, 2019. FINDINGS: Stable large amount of residual contrast and stool is seen throughout the colon. No significant small bowel dilatation is noted. No abnormal calcifications are noted. IMPRESSION: Stable large amount of residual contrast and stool is seen throughout the colon. No abnormal bowel dilatation is noted. Electronically Signed   By: Marijo Conception M.D.   On: 06/18/2019 10:59   Ct Head Wo Contrast  Result Date: 06/21/2019 CLINICAL DATA:  58 year old male with new onset seizure. EXAM: CT HEAD WITHOUT CONTRAST TECHNIQUE: Contiguous axial images were obtained from the base of the skull through the vertex without intravenous contrast. COMPARISON:  01/10/2019 CT and prior studies FINDINGS: Brain: No evidence of acute infarction, hemorrhage, hydrocephalus, extra-axial  collection or mass lesion/mass effect. Chronic small-vessel white matter ischemic changes and remote LEFT basal ganglia infarct again noted. Vascular: Carotid and vertebral atherosclerotic calcifications noted. Skull: Normal. Negative for fracture or focal lesion. Sinuses/Orbits: No acute  finding. Other: None. IMPRESSION: 1. No evidence of acute intracranial abnormality. 2. Chronic small-vessel white matter ischemic changes and remote LEFT basal ganglia infarct. Electronically Signed   By: Margarette Canada M.D.   On: 06/21/2019 21:37   Ct Head Wo Contrast  Result Date: 06/15/2019 CLINICAL DATA:  Fatigue and malaise. Hypertension. End-stage renal disease. EXAM: CT HEAD WITHOUT CONTRAST TECHNIQUE: Contiguous axial images were obtained from the base of the skull through the vertex without intravenous contrast. COMPARISON:  MRI of the head 12/13/2016 FINDINGS: Brain: A remote lacunar infarct of the left caudate head is new, but not acute. Extensive periventricular white matter changes are otherwise stable. Confluent subcortical white matter hypoattenuation is also stable, right greater than left. No acute cortical infarct, hemorrhage, or mass lesion is present. The ventricles are proportionate to the degree of atrophy. No significant extraaxial fluid collection is present. The brainstem and cerebellum are within normal limits. Vascular: Atherosclerotic changes are noted within the cavernous internal carotid arteries bilaterally. There is no hyperdense vessel. Skull: Calvarium is intact. No focal lytic or blastic lesions are present. No significant extracranial soft tissue lesion is present. Sinuses/Orbits: The paranasal sinuses and mastoid air cells are clear. The globes and orbits are within normal limits. IMPRESSION: 1. Left caudate head lacunar infarct is new since the prior study but appears remote. 2. Otherwise stable extensive periventricular and subcortical white matter changes bilaterally consistent chronic microvascular ischemia. 3. No acute intracranial abnormality. Electronically Signed   By: San Morelle M.D.   On: 06/15/2019 11:47   Mr Cervical Spine Wo Contrast  Result Date: 06/15/2019 CLINICAL DATA:  58 year old male with acute myelopathy. Known aortic dissection, recently  treated with stenting. EXAM: MRI CERVICAL AND THORACIC SPINE WITHOUT CONTRAST TECHNIQUE: Multiplanar and multiecho pulse sequences of the cervical spine, to include the craniocervical junction and cervicothoracic junction, and the thoracic spine, were obtained without intravenous contrast. COMPARISON:  CTA CT Chest, Abdomen, and Pelvis 06/13/2019 and earlier. No prior cervical spine imaging, but brain MRI 12/13/2016. FINDINGS: MRI CERVICAL SPINE FINDINGS Alignment: Straightening of cervical lordosis. No spondylolisthesis. Vertebrae: Degenerative appearing endplate marrow signal changes at C5-C6 with faint marrow edema. No other marrow edema or evidence of acute osseous abnormality. Background bone marrow signal is within normal limits. Cord: Mass effect on the cervical spinal cord at C4-C5 and C5-C6. Is difficult to exclude abnormal cord signal at both levels such as due to myelomalacia due to compressive myelopathy (series 8, image 24 at C5-C6). And there is evidence of abnormal signal on series 8, image 24 greater on the left. Above and below those levels spinal cord signal and morphology is within normal limits. Posterior Fossa, vertebral arteries, paraspinal tissues: Cervicomedullary junction is within normal limits. Negative visible posterior fossa. Preserved major vascular flow voids in the neck. The left vertebral artery appears dominant. Negative neck soft tissues. Disc levels: Degenerative changes notable for: C3-C4: Right eccentric circumferential disc osteophyte complex with mild to moderate right C4 foraminal stenosis. C4-C5: Circumferential disc osteophyte complex with broad-based posterior component. Spinal stenosis with mild to moderate spinal cord mass effect (series 8, image 20). Severe left and moderate to severe right C5 foraminal stenosis. C5-C6: Disc space loss with circumferential disc osteophyte complex. Broad-based posterior component. Mild spinal stenosis and mild to moderate  cord mass  effect. Severe bilateral C6 foraminal stenosis. C6-C7: Circumferential disc osteophyte complex mostly affecting the neural foramina. Borderline to mild spinal stenosis without cord mass effect. Severe bilateral C7 foraminal stenosis. C7-T1: Mild to moderate facet hypertrophy greater on the left. Mild to moderate left C8 foraminal stenosis. MRI THORACIC SPINE FINDINGS Thoracic spine segmentation:  Normal on the comparison CT. Alignment:  Preserved thoracic kyphosis.  No spondylolisthesis. Vertebrae: Mild endplate irregularity including superiorly at T8 appears to be stable and chronic. STIR hyperintensity at the inferior T6 endplate appears to be related to a Schmorl's node (series 14, image 10). No acute osseous abnormality identified. Cord: Above T10 the thoracic spinal cord signal and morphology is within normal limits. Beginning at T9-T10 there is suggestion of abnormal increased central cord signal (series 16, image 31), although no definite cord expansion. By the mid T11 level the cord signal normalizes, and the visible conus appears normal, terminating at T12-L1. Fairly capacious thoracic spinal canal. Paraspinal and other soft tissues: Thoracic aortic dissection with evidence of stent redemonstrated on series 16, image 16. Left pleural effusion appears regressed. Negative visible upper abdominal viscera. There is patchy nonspecific T2 and STIR hyperintensity in the midthoracic erector spinal muscles in the midline posteriorly (series 14, image 10 and series 16, image 20). Little if any muscle expansion. Other paraspinal soft tissues are within normal limits. Disc levels: Endplate degeneration but no thoracic spinal stenosis. No discrete thoracic disc herniation. IMPRESSION: 1. 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 2. More questionably abnormal spinal cord at the T9-T10 and T10-T11 level  where central cord signal abnormality without cord expansion might indicate a segmental cord infarct in this clinical setting. But the remaining thoracic spinal cord and conus appear normal. 3. Nonspecific mild bilateral posterior paraspinal muscle inflammation at the midthoracic level (T6 through T9). 4. Other cervical spine degeneration including up to severe multilevel cervical neural foraminal stenosis, and also mild spinal stenosis at C6-C7. 5. Thoracic aortic dissection and endograft re-demonstrated. Electronically Signed   By: Genevie Ann M.D.   On: 06/15/2019 17:20   Mr Thoracic Spine Wo Contrast  Result Date: 06/15/2019 CLINICAL DATA:  58 year old male with acute myelopathy. Known aortic dissection, recently treated with stenting. EXAM: MRI CERVICAL AND THORACIC SPINE WITHOUT CONTRAST TECHNIQUE: Multiplanar and multiecho pulse sequences of the cervical spine, to include the craniocervical junction and cervicothoracic junction, and the thoracic spine, were obtained without intravenous contrast. COMPARISON:  CTA CT Chest, Abdomen, and Pelvis 06/13/2019 and earlier. No prior cervical spine imaging, but brain MRI 12/13/2016. FINDINGS: MRI CERVICAL SPINE FINDINGS Alignment: Straightening of cervical lordosis. No spondylolisthesis. Vertebrae: Degenerative appearing endplate marrow signal changes at C5-C6 with faint marrow edema. No other marrow edema or evidence of acute osseous abnormality. Background bone marrow signal is within normal limits. Cord: Mass effect on the cervical spinal cord at C4-C5 and C5-C6. Is difficult to exclude abnormal cord signal at both levels such as due to myelomalacia due to compressive myelopathy (series 8, image 24 at C5-C6). And there is evidence of abnormal signal on series 8, image 24 greater on the left. Above and below those levels spinal cord signal and morphology is within normal limits. Posterior Fossa, vertebral arteries, paraspinal tissues: Cervicomedullary junction is  within normal limits. Negative visible posterior fossa. Preserved major vascular flow voids in the neck. The left vertebral artery appears dominant. Negative neck soft tissues. Disc levels: Degenerative changes  notable for: C3-C4: Right eccentric circumferential disc osteophyte complex with mild to moderate right C4 foraminal stenosis. C4-C5: Circumferential disc osteophyte complex with broad-based posterior component. Spinal stenosis with mild to moderate spinal cord mass effect (series 8, image 20). Severe left and moderate to severe right C5 foraminal stenosis. C5-C6: Disc space loss with circumferential disc osteophyte complex. Broad-based posterior component. Mild spinal stenosis and mild to moderate cord mass effect. Severe bilateral C6 foraminal stenosis. C6-C7: Circumferential disc osteophyte complex mostly affecting the neural foramina. Borderline to mild spinal stenosis without cord mass effect. Severe bilateral C7 foraminal stenosis. C7-T1: Mild to moderate facet hypertrophy greater on the left. Mild to moderate left C8 foraminal stenosis. MRI THORACIC SPINE FINDINGS Thoracic spine segmentation:  Normal on the comparison CT. Alignment:  Preserved thoracic kyphosis.  No spondylolisthesis. Vertebrae: Mild endplate irregularity including superiorly at T8 appears to be stable and chronic. STIR hyperintensity at the inferior T6 endplate appears to be related to a Schmorl's node (series 14, image 10). No acute osseous abnormality identified. Cord: Above T10 the thoracic spinal cord signal and morphology is within normal limits. Beginning at T9-T10 there is suggestion of abnormal increased central cord signal (series 16, image 31), although no definite cord expansion. By the mid T11 level the cord signal normalizes, and the visible conus appears normal, terminating at T12-L1. Fairly capacious thoracic spinal canal. Paraspinal and other soft tissues: Thoracic aortic dissection with evidence of stent  redemonstrated on series 16, image 16. Left pleural effusion appears regressed. Negative visible upper abdominal viscera. There is patchy nonspecific T2 and STIR hyperintensity in the midthoracic erector spinal muscles in the midline posteriorly (series 14, image 10 and series 16, image 20). Little if any muscle expansion. Other paraspinal soft tissues are within normal limits. Disc levels: Endplate degeneration but no thoracic spinal stenosis. No discrete thoracic disc herniation. IMPRESSION: 1. 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 2. More questionably abnormal spinal cord at the T9-T10 and T10-T11 level where central cord signal abnormality without cord expansion might indicate a segmental cord infarct in this clinical setting. But the remaining thoracic spinal cord and conus appear normal. 3. Nonspecific mild bilateral posterior paraspinal muscle inflammation at the midthoracic level (T6 through T9). 4. Other cervical spine degeneration including up to severe multilevel cervical neural foraminal stenosis, and also mild spinal stenosis at C6-C7. 5. Thoracic aortic dissection and endograft re-demonstrated. Electronically Signed   By: Genevie Ann M.D.   On: 06/15/2019 17:20   Mr Lumbar Spine Wo Contrast  Result Date: 06/15/2019 CLINICAL DATA:  58 year old male with thoracic aortic dissection recently treated with stent/endograft. Myelopathy. Cervical and thoracic MRI earlier today. EXAM: MRI LUMBAR SPINE WITHOUT CONTRAST TECHNIQUE: Multiplanar, multisequence MR imaging of the lumbar spine was performed. No intravenous contrast was administered. COMPARISON:  Cervical and thoracic MRI earlier today. [place] stent CT Chest, Abdomen, and Pelvis. 06/13/2019 FINDINGS: Segmentation:  Normal. Alignment:  Stable lumbar lordosis. Vertebrae: Background bone marrow signal is within normal  limits. There are degenerative endplate marrow signal changes at both L4-L5 and L5-S1, and there is rightward endplate marrow edema at L4-L5 (series 6, image 6), however there is some preserved fatty degenerative endplate marrow signal at both levels. See additional L4-L5 and L5-S1 details below. No other acute osseous abnormality identified. No sacral edema and negative visible SI joints. Conus medullaris and cauda equina: Conus extends to the T12-L1 level, the lower  thoracic spinal cord was described earlier today. Paraspinal and other soft tissues: There is abnormal prevertebral and retroperitoneal material in the lower abdomen and pelvis best seen on series 6, image 9. Some of this appears to be anterior to the aortoiliac bifurcation, but also is located in the lower lumbar and sacral prevertebral space. This seems to be new since the CT on 06/13/2019, but is of unclear etiology and significance. Furthermore, note that although the L4-L5 endplates demonstrate some marrow edema eccentric to the right, the adjacent right psoas muscle seems to remain normal on series 8, image 24. Both psoas muscles appear to remain normal throughout their course. Otherwise abnormal kidneys and fusiform aneurysmal enlargement of the abdominal aorta are redemonstrated. The posterior paraspinal soft tissues appear negative. Disc levels: L1-L2: Mild L1 foraminal stenosis greater on the left related to disc bulging and posterior element hypertrophy. L2-L3: Multifactorial mild to moderate spinal stenosis related to circumferential disc bulge, posterior element hypertrophy, and epidural lipomatosis. Moderate left greater than right L3 foraminal stenosis. L3-L4: Moderate to severe multifactorial spinal stenosis related to circumferential disc bulge, posterior element hypertrophy and some epidural lipomatosis. See series 8, image 19. Moderate bilateral L3 foraminal stenosis. L4-L5: Increased T2 and STIR signal in the central and right disc.  There was some vacuum phenomena here on the recent CT. Bulky underlying circumferential disc osteophyte complex eccentric to the right. Moderate posterior element hypertrophy. Mild spinal and moderate lateral recess stenosis. Mild to moderate left and severe right L4 foraminal stenosis. L5-S1: Similar increased T2 signal in the disc and bulky circumferential disc osteophyte complex although no convincing endplate marrow edema at this level. Mild epidural lipomatosis and posterior element hypertrophy. Moderate to severe bilateral L5 foraminal stenosis. IMPRESSION: 1. The dominant finding is new space-occupying material in the lower retroperitoneum and lumbosacral junction prevertebral space since the CT on 06/13/19. And although the L4-L5 disc and right endplates appear abnormal, the adjacent psoas muscle seem unaffected. However, in discussing this case just now with Dr. 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 Furthermore, we discussed that the hematocrit has been stable since 06/13/2019 - arguing against retroperitoneal hematoma. 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 No drainable fluid collection/abscess is identified. 2. Underlying lower lumbar spine degeneration with multifactorial moderate to severe spinal and/or neural foraminal stenosis L3-L4 through L5-S1. 3. Abdominal aortic aneurysm and native renal atrophy partially redemonstrated. Electronically Signed   By: Genevie Ann M.D.   On: 06/15/2019 20:29   Dg Chest Port 1 View  Result Date: 06/15/2019 CLINICAL DATA:  Reason for exam: ESRD. Sepsis Hx HTN, pneumonia, wide-complex tachycardia EXAM: PORTABLE CHEST 1 VIEW COMPARISON:  CT of the chest on 06/13/2019 performed at [facility]. Chest x-ray on 06/04/2019 from [place]: Patient has RIGHT-sided dialysis catheter, tip overlying the level of the superior  vena cava. Patient has had aortic stent graft since the previous chest x-ray. There is subsegmental atelectasis at both lung bases. CT exclude early LEFT LOWER lobe infiltrate. IMPRESSION: 1. Bibasilar atelectasis. 2. LEFT LOWER lobe infiltrate cannot be excluded. Electronically Signed   By: Nolon Nations M.D.   On: 06/15/2019 14:43   Dg Abd Portable 1v  Result Date: 06/21/2019 CLINICAL DATA:  Abdominal pain. EXAM: PORTABLE ABDOMEN - 1 VIEW COMPARISON:  06/13/2019 FINDINGS: A large amount of colonic stool is again noted. No dilated small bowel loops are present. Aortic graft again  noted. IMPRESSION: Large amount of colonic stool. No evidence of small bowel obstruction. Electronically Signed   By: Margarette Canada M.D.   On: 06/21/2019 20:35   Vas [place] Upper Extremity Venous Duplex  Result Date: 06/09/2019 UPPER VENOUS STUDY  Indications: Edema Risk Factors: None identified. Comparison Study: No prior studies. Performing Technologist: Oliver Hum RVT  Examination Guidelines: A complete evaluation includes B-mode imaging, spectral Doppler, color Doppler, and power Doppler as needed of all accessible portions of each vessel. Bilateral testing is considered an integral part of a complete examination. Limited examinations for reoccurring indications may be performed as noted.  Right Findings: +----------+------------+---------+-----------+----------+-------+ RIGHT     CompressiblePhasicitySpontaneousPropertiesSummary +----------+------------+---------+-----------+----------+-------+ Subclavian    Full       Yes       Yes                      +----------+------------+---------+-----------+----------+-------+  Left Findings: +----------+------------+---------+-----------+----------+-------+ LEFT      CompressiblePhasicitySpontaneousPropertiesSummary +----------+------------+---------+-----------+----------+-------+ IJV           Full       Yes       Yes                       +----------+------------+---------+-----------+----------+-------+ Subclavian    Full       Yes       Yes                      +----------+------------+---------+-----------+----------+-------+ Axillary      Full       Yes       Yes                      +----------+------------+---------+-----------+----------+-------+ Brachial      Full       Yes       Yes                      +----------+------------+---------+-----------+----------+-------+ Radial        Full                                          +----------+------------+---------+-----------+----------+-------+ Ulnar         Full                                          +----------+------------+---------+-----------+----------+-------+ Cephalic      Full                                          +----------+------------+---------+-----------+----------+-------+ Basilic       Full                                          +----------+------------+---------+-----------+----------+-------+  Summary:  Right: No evidence of thrombosis in the subclavian.  Left: No evidence of deep vein thrombosis in the upper extremity. No evidence of superficial vein thrombosis in the upper extremity.  *See table(s) above for measurements and observations.  Diagnosing physician: Servando Snare MD Electronically signed by Servando Snare MD  on 06/09/2019 at 2:17:27 PM.    Final    Hybrid Or Imaging (mc Only)  Result Date: 06/06/2019 There is no interpretation for this exam.  This order is for images obtained during a surgical procedure.  Please See "Surgeries" Tab for more information regarding the procedure.   Ir Lumbar Disc Aspiration W/img Guide  Result Date: 06/17/2019 INDICATION: Low back pain.  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 No drainable fluid collection/abscess is identified. EXAM: LUMBAR L4-5 DISC ASPIRATION UNDER FLUOROSCOPY MEDICATIONS: Lidocaine 1% subcutaneous  ANESTHESIA/SEDATION: Intravenous Fentanyl 179mcg and Versed 2mg  were administered as conscious sedation during continuous monitoring of the patient's level of consciousness and physiological / cardiorespiratory status by the radiology RN, with a total moderate sedation time of 15 minutes. PROCEDURE: Informed written consent was obtained from the patient after a thorough discussion of the procedural risks, benefits and alternatives. All questions were addressed. Maximal Sterile Barrier Technique was utilized including caps, mask, sterile gowns, sterile gloves, sterile drape, hand hygiene and skin antiseptic. A timeout was performed prior to the initiation of the procedure. The L4-5 interspace was identified under fluoroscopy, corresponding to previous cross-sectional imaging. An appropriate skin entry site was determined. After local infiltration with 1% lidocaine, a 17 gauge trocar needle was advanced into the interspace from left posterolateral extraforaminal approach. Needle tip position within the interspace confirmed on biplane images. Less than 1 mL of very thick opaque viscous debris were aspirated, sent for the requested laboratory studies. The patient tolerated the procedure well. FLUOROSCOPY TIME:  1.7 minutes; [id number] DAP COMPLICATIONS: None immediate. IMPRESSION: Technically successful lumbar L4-5 disc aspiration under fluoroscopy. Electronically Signed   By: Lucrezia Europe M.D.   On: 06/17/2019 16:42   Ct Angio Abd/pel W/ And/or W/o  Result Date: 06/22/2019 CLINICAL DATA:  58 year old male with drop in hemoglobin and bilateral leg weakness. Aortic stent placed several weeks ago. EXAM: CTA ABDOMEN AND PELVIS WITHOUT AND WITH CONTRAST TECHNIQUE: Multidetector CT imaging of the abdomen and pelvis was performed using the standard protocol during bolus administration of intravenous contrast. Multiplanar reconstructed images and MIPs were obtained and reviewed to evaluate the vascular anatomy. CONTRAST:   166mL OMNIPAQUE IOHEXOL 350 MG/ML SOLN COMPARISON:  06/13/2019 CT from [facility] and other prior studies. FINDINGS: VASCULAR Aorta: A thoracic aortic stent graft is again noted without interval change or adjacent hematoma. A 3.5 cm infrarenal suprailiac abdominal aortic aneurysm is again noted without dissection or periaortic hematoma. Celiac: Patent with moderate atherosclerotic plaque at the origin. SMA: Patent with mild atherosclerotic plaque at the origin Renals: Patent with atherosclerotic plaque at the origins IMA: Patent with atherosclerotic plaque at the origin Review of the MIP images confirms the above findings. NON-VASCULAR Lower chest: No acute abnormality. Hepatobiliary: The liver and gallbladder are unremarkable. No biliary dilatation. Pancreas: Unremarkable Spleen: Unremarkable Adrenals/Urinary Tract: Mildly atrophic kidneys noted with too small to characterize renal lesions again noted. Stomach/Bowel: There is no evidence of bowel obstruction. No definite bowel wall thickening identified. The appendix is not identified. A large amount of stool within the rectum is noted with mild circumferential rectal wall thickening. Lymphatic: No enlarged lymph nodes identified. Reproductive: No definite abnormality Other: A small amount of ascites noted as well as edema within the subcutaneous, abdominal and pelvic fat. Moderate ill-defined fluid in the presacral/precoccygeal region is noted, significantly increased from 06/13/2019. Musculoskeletal: No acute or suspicious bony abnormality. Moderate degenerative disc disease, spondylosis and broad-based disc bulges contribute to moderate  to severe central spinal and biforaminal narrowing at L4-5 and mild to moderate central spinal and biforaminal narrowing at L5-S1. IMPRESSION: VASCULAR Unchanged appearance of the visualized aorta with thoracic aortic stent graft and 3.5 cm abdominal aortic aneurysm. No evidence of aortic dissection or periaortic  hematoma/hemorrhage. NON-VASCULAR Increased edema within the subcutaneous, abdominal and pelvic fat. New moderate ill-defined fluid in the presacral/pre coccygeal space most likely represents edema. Small amount of ascites. Degenerative changes within the LOWER lumbar spine contributing to moderate to severe central spinal and biforaminal narrowing at L4-5 and mild to moderate central spinal and biforaminal narrowing at L5-S1. Large amount of rectal stool. No other acute abnormality identified. Electronically Signed   By: Margarette Canada M.D.   On: 06/22/2019 16:33       Subjective: Patient seen and examined at bedside.  He is a poor historian.  Nursing staff reports no further rectal bleeding since admission.  No overnight fever or vomiting.  Discharge Exam: Vitals:   07/04/19 0809  BP: (!) 178/99  Pulse: 84  Resp: 16  Temp: 99.7 F (37.6 C)  SpO2: 93%    General: Pt is alert, awake, not in acute distress.  Poor historian Cardiovascular: rate controlled, S1/S2 + Respiratory: bilateral decreased breath sounds at bases with scattered crackles Abdominal: Soft, mild periumbilical tenderness present, ND, bowel sounds + Extremities: no edema, no cyanosis    The results of significant diagnostics from this hospitalization (including imaging, microbiology, ancillary and laboratory) are listed below for reference.     Microbiology: No results found for this or any previous visit (from the past 240 hour(s)).   Labs: BNP (last 3 results) No results for input(s): BNP in the last 8760 hours. Basic Metabolic Panel: Recent Labs  Lab 06/28/19 0518 06/30/19 0642 07/01/19 0711 07/03/19 0814 07/04/19 0756  NA 132* 131* 131* 131* 135  K 4.5 4.5 4.9 4.4 3.9  CL 94* 93* 94* 93* 98  CO2 22 22 21* 22 25  GLUCOSE 93 97 99 88 90  BUN 46* 62* 74* 45* 22*  CREATININE 7.98* 10.69* 13.24* 10.17* 6.75*  CALCIUM 8.6* 8.4* 8.6* 8.8* 8.9  MG  --  1.9  --   --   --   PHOS  --   --  7.5* 6.7* 5.4*    Liver Function Tests: Recent Labs  Lab 07/01/19 0711 07/03/19 0814 07/04/19 0756  ALBUMIN 1.8* 1.9* 2.0*   No results for input(s): LIPASE, AMYLASE in the last 168 hours. No results for input(s): AMMONIA in the last 168 hours. CBC: Recent Labs  Lab 07/02/19 0439 07/03/19 0815 07/04/19 0203 07/04/19 0756 07/04/19 0758  WBC 5.0 5.1 4.8 4.1 4.2  NEUTROABS  --   --  3.1  --   --   HGB 8.5* 8.0* 8.6* 8.6* 8.5*  HCT 25.9* 24.9* 26.7* 26.5* 27.0*  MCV 90.2 92.2 91.8 90.8 91.8  PLT 237 223 209 207 223   Cardiac Enzymes: No results for input(s): CKTOTAL, CKMB, CKMBINDEX, TROPONINI in the last 168 hours. BNP: Invalid input(s): POCBNP CBG: No results for input(s): GLUCAP in the last 168 hours. D-Dimer No results for input(s): DDIMER in the last 72 hours. Hgb A1c No results for input(s): HGBA1C in the last 72 hours. Lipid Profile No results for input(s): CHOL, HDL, LDLCALC, TRIG, CHOLHDL, LDLDIRECT in the last 72 hours. Thyroid function studies No results for input(s): TSH, T4TOTAL, T3FREE, THYROIDAB in the last 72 hours.  Invalid input(s): FREET3 Anemia work up No results for  input(s): VITAMINB12, FOLATE, FERRITIN, TIBC, IRON, RETICCTPCT in the last 72 hours. Urinalysis    Component Value Date/Time   COLORURINE YELLOW 08/04/2017 1824   APPEARANCEUR HAZY (A) 08/04/2017 1824   LABSPEC 1.009 08/04/2017 1824   PHURINE 9.0 (H) 08/04/2017 1824   GLUCOSEU 150 (A) 08/04/2017 1824   HGBUR NEGATIVE 08/04/2017 1824   BILIRUBINUR NEGATIVE 08/04/2017 1824   KETONESUR NEGATIVE 08/04/2017 1824   PROTEINUR >=300 (A) 08/04/2017 1824   NITRITE NEGATIVE 08/04/2017 1824   LEUKOCYTESUR NEGATIVE 08/04/2017 1824   Sepsis Labs Invalid input(s): PROCALCITONIN,  WBC,  LACTICIDVEN Microbiology No results found for this or any previous visit (from the past 240 hour(s)).   Time coordinating discharge: 35 minutes  SIGNED:   Aline August, MD  [facility] 07/04/2019, 10:18  AM

## 2019-07-04 NOTE — Progress Notes (Signed)
Rapid Response Note:  I was contacted by the nursing staff to assess the patient due to large bloody BM with clots. Frank Fuller was alert, oriented and emotional. Reportedly he has been refusing enemas per nursing. He reportedly had a large bloody BM with clots tonight. VS: temp 98.7, HR 75, 167/86, RR 18 and sats 99% on RA. Pt did not currently have any further bleeding at this time. Skin is warm, pink and dry. Previous order for CBC obtained and is pending.  I recommended rechecking his VS if there is any further clinical change and again in an hour.

## 2019-07-04 NOTE — Progress Notes (Signed)
Contacted by Nursing that patient has recurrent rectal bleeding with clots and ongoing bleeding. BP stable. Stat CBC ordered with every 6 hours H/H. Paged Hospitalist regarding input and need for transfers. Paged GI on call for input as patient treated with Flex sig and hemospray on 9/4.

## 2019-07-04 NOTE — Progress Notes (Signed)
Patient ID: Frank Fuller, male   DOB: Oct 08, 1961, 58 y.o.   MRN: [id number] Transiently transferred to the acute care hospital from [facility] for evaluation of rectal bleeding-stable hemoglobin and hematocrit and recent GI evaluation noted-he is being transferred back to [facility].  Elmarie Shiley MD [facility]. Office # [phone] Pager # [phone] 10:54 AM

## 2019-07-04 NOTE — Progress Notes (Signed)
Patient information reviewed and entered into [facility] by Becky Remmi Armenteros, PPS coordinator. Information including medical coding, function ability, and quality indicators will be reviewed and updated through discharge.   
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
## 2019-07-04 NOTE — Progress Notes (Signed)
Received call from Dr. Henrene Pastor who recommended supportive care as patient with rectal ulcer and they will follow up with patient in am.  Note that patient had HD today and likely received IV heparin which restarted rectal bleeding. Will continue to monitor

## 2019-07-04 NOTE — IPOC Note (Signed)
Full IPOC to be completed once patient resumes therapies on inpatient rehab.

## 2019-07-04 NOTE — Progress Notes (Addendum)
Vitals stable. Patient apparently refused Carafate enema at bedtime---nurse advised to administer this and explain rationale to patient. Continue frequent vitals for now. Received call from Hospitalist call center---MD in ED at this time. I relayed that patient is stable and bleeding likely recurred due to HD/IV heparin use. [facility] has be contacted and would like Hospitalist to check on patient sometime tonight in case he decompensates and needs transfer.    Addendum: Acute chart reviewed and note that patient has been refusing carafate enemas for past 2 days. Will need extensive education for compliance. Will check KUB to help determine stool burden as he may need bowel program to prevent recurrent issues with obstipation/impaction.

## 2019-07-04 NOTE — Progress Notes (Signed)
Updated Hospitalist who recommended discharge to step down or tele for closer monitoring. She will evaluate patient to make final decision.

## 2019-07-04 NOTE — Progress Notes (Signed)
NT notified Nurse that patient had a bowel movement but the was a copious amount of blood an a very small amount of stool. The blood went threw his brief and threw chuck pad and covered fitted sheet in a large circle.   [name] on call provider notified instructed me to get a Stat CBC and q63mins vitals. Patient has a saline loc in place.  Patient is a&o x4 will continue to monitor.

## 2019-07-04 NOTE — Progress Notes (Signed)
[facility] Progress Note   Subjective:  Seen in room. Frustrated with recurrent bleeding issues which hamper his progress, tearful this morning. Per notes, was transferred to back to acute hospital team after hematochezia episode at CIR - GI eval - since Hgb was stable, no further procedures planned. Will be going back on Carafate enemas BID and transferring back to CIR today.  Objective Vitals:   07/04/19 0809  BP: (!) 178/99  Pulse: 84  Resp: 16  Temp: 99.7 F (37.6 C)  TempSrc: Oral  SpO2: 93%   Physical Exam General: Frail appearing man, tearful/depressed Heart: RRR; no murmur Lungs: CTA Abdomen: soft, non-tender Extremities: No LE edema Dialysis Access:  [facility]  Additional Objective Labs: Basic Metabolic Panel: Recent Labs  Lab 07/01/19 0711 07/03/19 0814 07/04/19 0756  NA 131* 131* 135  K 4.9 4.4 3.9  CL 94* 93* 98  CO2 21* 22 25  GLUCOSE 99 88 90  BUN 74* 45* 22*  CREATININE 13.24* 10.17* 6.75*  CALCIUM 8.6* 8.8* 8.9  PHOS 7.5* 6.7* 5.4*   Liver Function Tests: Recent Labs  Lab 07/01/19 0711 07/03/19 0814 07/04/19 0756  ALBUMIN 1.8* 1.9* 2.0*   CBC: Recent Labs  Lab 07/02/19 0439 07/03/19 0815 07/04/19 0203 07/04/19 0756 07/04/19 0758  WBC 5.0 5.1 4.8 4.1 4.2  NEUTROABS  --   --  3.1  --   --   HGB 8.5* 8.0* 8.6* 8.6* 8.5*  HCT 25.9* 24.9* 26.7* 26.5* 27.0*  MCV 90.2 92.2 91.8 90.8 91.8  PLT 237 223 209 207 223   Blood Culture    Component Value Date/Time   SDES WOUND 06/17/2019 1512   SPECREQUEST L4 L5 DISC ASPIRATION 06/17/2019 1512   CULT  06/17/2019 1512    No growth aerobically or anaerobically. Performed at [facility], [place] [address], [place], [place] [address]    REPTSTATUS 06/22/2019 FINAL 06/17/2019 1512   Studies/Results: Ct Abdomen Pelvis Wo Contrast  Result Date: 07/04/2019 CLINICAL DATA:  GI bleed EXAM: CT ABDOMEN AND PELVIS WITHOUT CONTRAST TECHNIQUE: Multidetector CT imaging of the abdomen and  pelvis was performed following the standard protocol without IV contrast. COMPARISON:  June 22, 2019 FINDINGS: Lower chest: The visualized heart size within normal limits. No pericardial fluid/thickening. 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 A small hiatal hernia is seen. The visualized portions of the lungs are clear. Hepatobiliary: Although limited due to the lack of intravenous contrast, normal in appearance without gross focal abnormality. No evidence of calcified gallstones or biliary ductal dilatation. Pancreas:  Unremarkable.  No surrounding inflammatory changes. Spleen: Normal in size. Although limited due to the lack of intravenous contrast, normal in appearance. Adrenals/Urinary Tract: Both adrenal glands appear normal. Bilateral renal atrophy seen. Stomach/Bowel: The stomach and small bowel are grossly unremarkable. There is a moderate amount of colonic stool. Within the presacral and pre coccygeal space there is increased fat stranding, heterogeneous soft tissue density, and ill-defined mesenteric edema. No definite loculated fluid collection is seen. There appears to be circumferential rectal bowel wall thickening as well. Vascular/Lymphatic: There are no enlarged abdominal or pelvic lymph nodes. Scattered aortic atherosclerotic calcifications are seen without aneurysmal dilatation. Reproductive: The prostate is unremarkable. Other: No evidence of abdominal wall mass or hernia.  No free air. Musculoskeletal: No acute or significant osseous findings. IMPRESSION: 1. Interval slight worsening in the presacral and pre coccygeal ill-defined heterogeneous edema and soft tissue density with circumferential rectal wall thickening. This is likely related to  patient's gastrointestinal bleed. No definite loculated fluid collection Electronically Signed   By: Prudencio Pair M.D.   On: 07/04/2019 04:04   Medications: . [START ON 07/05/2019] ceFEPime (MAXIPIME) IV    . [START ON 07/05/2019] vancomycin      . atorvastatin  20 mg Oral q1800  . lanthanum  1,000 mg Oral TID WC  . metroNIDAZOLE  250 mg Oral TID AC & HS  . pantoprazole (PROTONIX) IV  40 mg Intravenous Q12H  . Sucralfate Enema (sucralfate 2g/sterile water 60 ml enema)  2 g Rectal BID  . tetracycline  500 mg Oral TID AC & HS    Dialysis Orders: [facility] 4:15hr,400/1.5,87.5kg,2/2.25, Profile #2, R TDC, no heparin. - Parsabiv 2.5mg  IV q HD  - Calcitriol 1.95mcg PO q HD - home meds: labetalol 300 bid/ amlodipine 10 hs/ hydralazine 100 tid/ losartan 100 qd/ minoxidil 5 bid  Assessment/Plan: 1. Spinal cord infarct/ paraperesis: per spinal MRI 8/23at T9- T11, after complaints of not being able to move legs. 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 Previous limited partiicipation with PT I think related to depression - really needs psychotherapy to deal with mobility deficits. Plan for transfer back to CIR today. 2. L4-L5 lumbar discitis: s/p recent aortic stentgraft.BCx on admit negative. Per ID, will need 6 weeks of IV Vanc + Cefepime with HD (until 07/29/2019).  3. Hematochezia (recurrent):GI consulted. Flex sig 9/1 showedstigmata of recent bleedinganddiffusely ulcerated rectum.EGD with fewnon-bleeding erosions in gastric antrum. Recommended miralax, carafate - try to limit < 2 weeks. Repeat Flex sig 9/4 withhemostatic spray to entire rectum.On Tetracycline + Flagyl as wellfor possible H.pylori. 4. ESRD: Continue HD on TTS schedule - no heparin.K 4.4  5. RecentType B aortic dissection (s/p stent graft repair 8/14): Goal SBP per [facility]VS 120-170. Known severe resistant HTN. Trying to avoid BP drops on HD. On ASA 6. Anemia(ESRD + ABLA): S/p7U PRBCs this admit. Continue max doseAranesp 200 mcg IVweekly (last 9/3). Transfuse prn 7. Secondary hyperparathyroidism -Ca/phos in goal. Continue calcitriol. Parsabiv not  on hospital formulary.Binders changed to Fosrenol d/t concern that Auryxia irritating his bowels. 8. HTN/volume: Goal SBP per [facility]VS 120-170.net UF 2.5 L post wt 86.5  9/8 Goal increased to 3.5 today due to low Na and ^ BP      Back on homeamlodipine,partialhydralazineandlabetalol (prior losartan/minoxidil remain on hold). 9.   Nutrition: Alb very low, resume supplements and diet liberalized to regular w/fluid restriction - encourage intake 10. Recent MSSA bacteremia/AVG infection: S/p AVG removal andAncefcoursecompleted. 11. Depression - huge change from prior lifestyle with prolonged admit, serious illness, and recurrent set-backs, he is understandably depressed. Would strongly recommend starting oral antidepressant - Zoloft is a great choice for ESRD and/or Remeron which can be used for appetite stimulation.  Veneta Penton, PA-C 07/04/2019, 10:56 AM  [facility] Pager: [phone]

## 2019-07-04 NOTE — Discharge Summary (Deleted)
  The note originally documented on this encounter has been moved the the encounter in which it belongs.  

## 2019-07-04 NOTE — Progress Notes (Signed)
Pt admitted to back to 9M-04. Patient resting in bed, call light within reach, dinner ordered. No complaint of pain. Doy Hutching, LPN

## 2019-07-04 NOTE — Progress Notes (Signed)
Report given to the [place]. She verbalized understanding and I will be transporting patient to [place].

## 2019-07-05 ENCOUNTER — Inpatient Hospital Stay (HOSPITAL_COMMUNITY): Payer: Medicare Other

## 2019-07-05 ENCOUNTER — Encounter (HOSPITAL_COMMUNITY): Payer: Self-pay

## 2019-07-05 ENCOUNTER — Inpatient Hospital Stay (HOSPITAL_COMMUNITY): Payer: Medicare Other | Admitting: Occupational Therapy

## 2019-07-05 ENCOUNTER — Other Ambulatory Visit: Payer: Self-pay

## 2019-07-05 ENCOUNTER — Inpatient Hospital Stay (HOSPITAL_COMMUNITY): Payer: Medicare Other | Admitting: Physical Therapy

## 2019-07-05 LAB — CBC
HCT: 27.4 % — ABNORMAL LOW (ref 39.0–52.0)
Hemoglobin: 8.8 g/dL — ABNORMAL LOW (ref 13.0–17.0)
MCH: 29.6 pg (ref 26.0–34.0)
MCHC: 32.1 g/dL (ref 30.0–36.0)
MCV: 92.3 fL (ref 80.0–100.0)
Platelets: 204 10*3/uL (ref 150–400)
RBC: 2.97 MIL/uL — ABNORMAL LOW (ref 4.22–5.81)
RDW: 15.8 % — ABNORMAL HIGH (ref 11.5–15.5)
WBC: 4.5 10*3/uL (ref 4.0–10.5)
nRBC: 0 % (ref 0.0–0.2)

## 2019-07-05 LAB — RENAL FUNCTION PANEL
Albumin: 2.1 g/dL — ABNORMAL LOW (ref 3.5–5.0)
Anion gap: 16 — ABNORMAL HIGH (ref 5–15)
BUN: 38 mg/dL — ABNORMAL HIGH (ref 6–20)
CO2: 22 mmol/L (ref 22–32)
Calcium: 9.1 mg/dL (ref 8.9–10.3)
Chloride: 97 mmol/L — ABNORMAL LOW (ref 98–111)
Creatinine, Ser: 9.48 mg/dL — ABNORMAL HIGH (ref 0.61–1.24)
GFR calc Af Amer: 6 mL/min — ABNORMAL LOW (ref >60)
GFR calc non Af Amer: 5 mL/min — ABNORMAL LOW (ref >60)
Glucose, Bld: 112 mg/dL — ABNORMAL HIGH (ref 70–99)
Phosphorus: 6 mg/dL — ABNORMAL HIGH (ref 2.5–4.6)
Potassium: 4.4 mmol/L (ref 3.5–5.1)
Sodium: 135 mmol/L (ref 135–145)

## 2019-07-05 MED ORDER — AMLODIPINE BESYLATE 10 MG PO TABS
10.0000 mg | ORAL_TABLET | Freq: Every day | ORAL | Status: DC
  Administered 2019-07-05 – 2019-07-28 (×24): 10 mg via ORAL
  Filled 2019-07-05 (×25): qty 1

## 2019-07-05 MED ORDER — VANCOMYCIN HCL IN DEXTROSE 1-5 GM/200ML-% IV SOLN
INTRAVENOUS | Status: AC
  Administered 2019-07-05: 1000 mg via INTRAVENOUS
  Filled 2019-07-05: qty 200

## 2019-07-05 MED ORDER — HEPARIN SODIUM (PORCINE) 1000 UNIT/ML IJ SOLN
INTRAMUSCULAR | Status: AC
  Administered 2019-07-05: 3400 [IU] via INTRAVENOUS_CENTRAL
  Filled 2019-07-05: qty 4

## 2019-07-05 MED ORDER — HYDROCODONE-ACETAMINOPHEN 7.5-325 MG PO TABS
1.0000 | ORAL_TABLET | ORAL | Status: DC | PRN
  Administered 2019-07-05 – 2019-07-06 (×4): 1 via ORAL
  Filled 2019-07-05 (×4): qty 1

## 2019-07-05 MED ORDER — HYDRALAZINE HCL 50 MG PO TABS: 100.0000 mg | ORAL_TABLET | Freq: Three times a day (TID) | ORAL | Status: DC

## 2019-07-05 MED ORDER — HYDRALAZINE HCL 50 MG PO TABS
100.0000 mg | ORAL_TABLET | Freq: Three times a day (TID) | ORAL | Status: DC
  Administered 2019-07-05 – 2019-07-29 (×59): 100 mg via ORAL
  Filled 2019-07-05 (×66): qty 2

## 2019-07-05 MED ORDER — NEPRO/CARBSTEADY PO LIQD
237.0000 mL | Freq: Two times a day (BID) | ORAL | Status: DC
  Administered 2019-07-06 – 2019-07-25 (×13): 237 mL via ORAL

## 2019-07-05 MED ORDER — LABETALOL HCL 200 MG PO TABS
300.0000 mg | ORAL_TABLET | Freq: Two times a day (BID) | ORAL | Status: DC
  Administered 2019-07-05 – 2019-07-27 (×37): 300 mg via ORAL
  Filled 2019-07-05 (×37): qty 1

## 2019-07-05 MED ORDER — RENA-VITE PO TABS
1.0000 | ORAL_TABLET | Freq: Every day | ORAL | Status: DC
  Administered 2019-07-05 – 2019-07-28 (×24): 1 via ORAL
  Filled 2019-07-05 (×25): qty 1

## 2019-07-05 NOTE — Progress Notes (Signed)
Patient's blood pressure 185/111 at start of second therapy. MD notified. Hydralazine ordered and given. Blood pressure remaining high. Dialysis called to collect patient and stated they would monitor blood pressure and give ordered beta blocker if needed.   Patient has order for retention carafate enema twice daily. Discussed with patient and pharmacy to change administration times to 0600 and 1800. Unable to administer enema at 0800 without interrupting therapy sessions, especially on HD days.
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
## 2019-07-05 NOTE — Progress Notes (Signed)
[facility] Progress Note   Dialysis Orders: [facility] 4:15hr,400/1.5,87.5kg,2/2.25, Profile #2, R TDC, no heparin. - Parsabiv 2.5mg  IV q HD  - Calcitriol 1.2mcg PO q HD - home BP Rx > labetalol 300 bid/ amlodipine 10 hs/ hydralazine 100 tid/ losartan 100 qd/ minoxidil 5 bid  Assessment/Plan: 1. Spinal cord infarct/ paraperesis: per spinal MRI 8/23 at T9- T11, after complaints of not being able to move legs. 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 Neurosurgery consulted, no plan for intervention, recommend daily ASA. previous limited partiicipation with PT I think related to depression - really needs psychotherapy to deal with mobility deficits.  Transferred  back to CIR 9/11 2. L4-L5 lumbar discitis: s/p recent aortic stentgraft.BCx on admit negative. Per ID, will need 6 weeks of IV Vanc + Cefepime with HD (until 07/29/2019).  3. Hematochezia- recurrent:GI consulted.Flex sig 9/1 showedstigmata of recent bleedinganddiffusely ulcerated rectum.EGD with fewnon-bleeding erosions in gastric antrum. Recommended miralax, carafate - try to limit < 2 weeks. Repeat Flex sig 9/4 withhemostatic spray to entire rectum.On Tetracycline + Flagyl as well for possible H.pylori. hgb 7 9/8 up to 8.5 after tranfusion 1 unit 9/8 . hgb down to 8 9/10 4. ESRD: Continue HD on TTS schedule - no heparin. HD today - 2 K 2 Ca  5. RecentType B aortic dissection (s/p stent graft repair 8/14): Goal SBP per [facility] 120-170. Known severe resistant HTN. Trying to avoid BP drops on HD. On ASA 6. Anemia(ESRD + ABLA): S/p7U PRBCs during prior acute admission.  Continue max doseAranesp 200 mcg IVweekly (last 9/3). Transfuse prn hgb 8.8 stable 7. Secondary hyperparathyroidism -Ca/phos in goal. Continue calcitriol. Parsabiv not on hospital formulary.Binders changed to Fosrenol d/t concern that Auryxia irritating his bowels. 8. HTN/volume: Goal SBP per [facility]  120-170.net UF 2.5 L post wt 86.5  9/8 and 2.8 9/10 Only on hydralazine a off mlodipine and labetalol prior acute admission - not sure why not on now ; will resume;  prior losartan and minoxidil on hold BP higher today - titrate volume on HD today but avoid BP drop 9.   Nutrition: Alb very low, resumed supplements.- diet liberalized to regular  w/fluid restriction - encourage intake/ added vits 10. Recent MSSA bacteremia/AVG infection: S/p AVG removal andAncefcoursecompleted. 11. Depression - huge change from prior lifestyle with prolonged admit, serious illness, and recurrent set-backs, he is understandably depressed. Would strongly recommend starting oral antidepressant - Zoloft is a great choice for ESRD and/or Remeron which can be used for appetite stimulation. Sprits dramatically better today - working with PT  Myriam Jacobson, PA-C [place] [phone] 07/05/2019,11:33 AM  LOS: 1 day   Subjective:   Worked all am with PT. Now back in bed by PT due to BP ^ asymptomatic. Meds adjusted as above - not clear why amlodipine and labetolol not resumed with readmission labs.  Objective Vitals:   07/04/19 1949 07/05/19 0453 07/05/19 0655 07/05/19 0929  BP: (!) 181/103 (!) 185/111  (!) 180/100  Pulse: 86 71  67  Resp:  18    Temp: 99.5 F (37.5 C) 98.6 F (37 C)    TempSrc: Oral Oral    SpO2: 97% 98%    Weight:   85.2 kg    Physical Exam  General: slender somewhat frail Heart: RRR no murmur Lungs: grossly clear Abdomen: soft ND Extremities: no LE edema Dialysis Access:  Right [facility]    Additional Objective Labs: Basic Metabolic Panel: Recent Labs  Lab 07/03/19 0814 07/04/19 0756 07/05/19 0929  NA 131* 135 135  K 4.4 3.9 4.4  CL 93* 98 97*  CO2 22 25 22   GLUCOSE 88 90 112*  BUN 45* 22* 38*  CREATININE 10.17* 6.75* 9.48*  CALCIUM 8.8* 8.9 9.1  PHOS 6.7* 5.4* 6.0*   Liver Function Tests: Recent Labs  Lab 07/03/19 0814 07/04/19 0756  07/05/19 0929  ALBUMIN 1.9* 2.0* 2.1*   No results for input(s): LIPASE, AMYLASE in the last 168 hours. CBC: Recent Labs  Lab 07/04/19 0203 07/04/19 0756 07/04/19 0758 07/04/19 1132 07/05/19 0524  WBC 4.8 4.1 4.2 4.2 4.5  NEUTROABS 3.1  --   --   --   --   HGB 8.6* 8.6* 8.5* 8.8* 8.8*  HCT 26.7* 26.5* 27.0* 27.5* 27.4*  MCV 91.8 90.8 91.8 92.0 92.3  PLT 209 207 223 237 204   Blood Culture    Component Value Date/Time   SDES BLOOD LEFT HAND 07/03/2019 1644   SPECREQUEST  07/03/2019 1644    BOTTLES DRAWN AEROBIC ONLY Blood Culture adequate volume   CULT  07/03/2019 1644    NO GROWTH 2 DAYS Performed at [facility] [place] [address] [place],  [address]    REPTSTATUS PENDING 07/03/2019 1644    Cardiac Enzymes: No results for input(s): CKTOTAL, CKMB, CKMBINDEX, TROPONINI in the last 168 hours. CBG: No results for input(s): GLUCAP in the last 168 hours. Iron Studies: No results for input(s): IRON, TIBC, TRANSFERRIN, FERRITIN in the last 72 hours. Lab Results  Component Value Date   INR 1.3 (H) 06/17/2019   INR 1.3 (H) 06/05/2019   INR 1.3 (H) 06/03/2019   Studies/Results: Ct Abdomen Pelvis Wo Contrast  Result Date: 07/04/2019 CLINICAL DATA:  GI bleed EXAM: CT ABDOMEN AND PELVIS WITHOUT CONTRAST TECHNIQUE: Multidetector CT imaging of the abdomen and pelvis was performed following the standard protocol without IV contrast. COMPARISON:  June 22, 2019 FINDINGS: Lower chest: The visualized heart size within normal limits. No pericardial fluid/thickening. 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 A small hiatal hernia is seen. The visualized portions of the lungs are clear. Hepatobiliary: Although limited due to the lack of intravenous contrast, normal in appearance without gross focal abnormality. No evidence of calcified gallstones or biliary ductal dilatation. Pancreas:  Unremarkable.  No surrounding inflammatory changes. Spleen: Normal in size. Although  limited due to the lack of intravenous contrast, normal in appearance. Adrenals/Urinary Tract: Both adrenal glands appear normal. Bilateral renal atrophy seen. Stomach/Bowel: The stomach and small bowel are grossly unremarkable. There is a moderate amount of colonic stool. Within the presacral and pre coccygeal space there is increased fat stranding, heterogeneous soft tissue density, and ill-defined mesenteric edema. No definite loculated fluid collection is seen. There appears to be circumferential rectal bowel wall thickening as well. Vascular/Lymphatic: There are no enlarged abdominal or pelvic lymph nodes. Scattered aortic atherosclerotic calcifications are seen without aneurysmal dilatation. Reproductive: The prostate is unremarkable. Other: No evidence of abdominal wall mass or hernia.  No free air. Musculoskeletal: No acute or significant osseous findings. IMPRESSION: 1. Interval slight worsening in the presacral and pre coccygeal ill-defined heterogeneous edema and soft tissue density with circumferential rectal wall thickening. This is likely related to patient's gastrointestinal bleed. No definite loculated fluid collection Electronically Signed   By: Prudencio Pair M.D.   On: 07/04/2019 04:04   Medications: . sodium chloride    . sodium chloride    . ceFEPime (MAXIPIME) IV    .  vancomycin     . atorvastatin  20 mg Oral q1800  . hydrALAZINE  100 mg Oral Q8H  . lanthanum  1,000 mg Oral TID WC  . metroNIDAZOLE  250 mg Oral TID WC & HS  . NONFORMULARY OR COMPOUNDED ITEM 2 g  2 g Rectal BID  . pantoprazole (PROTONIX) IV  40 mg Intravenous Q12H  . tetracycline  500 mg Oral TID AC & HS
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
## 2019-07-05 NOTE — Procedures (Signed)
Patient seen on Hemodialysis. BP (!) 192/116   Pulse 73   Temp 98.6 F (37 C) (Oral)   Resp 16   Wt 85.6 kg   SpO2 98%   BMI 22.97 kg/m   QB 400, UF goal 2.5L Tolerating treatment without complaints at this time.   Elmarie Shiley MD [facility]. Office # [phone] Pager # [phone] 1:57 PM

## 2019-07-05 NOTE — Progress Notes (Addendum)
[place] PHYSICAL MEDICINE & REHABILITATION PROGRESS NOTE   Subjective/Complaints: No abd pain, discussed Hgb Back pain uncontrolled    ROS: Patient deniesnausea, vomiting, diarrhea, cough, shortness of breath or chest pain,    Objective:   Ct Abdomen Pelvis Wo Contrast  Result Date: 07/04/2019 CLINICAL DATA:  GI bleed EXAM: CT ABDOMEN AND PELVIS WITHOUT CONTRAST TECHNIQUE: Multidetector CT imaging of the abdomen and pelvis was performed following the standard protocol without IV contrast. COMPARISON:  June 22, 2019 FINDINGS: Lower chest: The visualized heart size within normal limits. No pericardial fluid/thickening. 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 A small hiatal hernia is seen. The visualized portions of the lungs are clear. Hepatobiliary: Although limited due to the lack of intravenous contrast, normal in appearance without gross focal abnormality. No evidence of calcified gallstones or biliary ductal dilatation. Pancreas:  Unremarkable.  No surrounding inflammatory changes. Spleen: Normal in size. Although limited due to the lack of intravenous contrast, normal in appearance. Adrenals/Urinary Tract: Both adrenal glands appear normal. Bilateral renal atrophy seen. Stomach/Bowel: The stomach and small bowel are grossly unremarkable. There is a moderate amount of colonic stool. Within the presacral and pre coccygeal space there is increased fat stranding, heterogeneous soft tissue density, and ill-defined mesenteric edema. No definite loculated fluid collection is seen. There appears to be circumferential rectal bowel wall thickening as well. Vascular/Lymphatic: There are no enlarged abdominal or pelvic lymph nodes. Scattered aortic atherosclerotic calcifications are seen without aneurysmal dilatation. Reproductive: The prostate is unremarkable. Other: No evidence of abdominal wall mass or hernia.  No free air. Musculoskeletal: No acute or significant osseous findings. IMPRESSION:  1. Interval slight worsening in the presacral and pre coccygeal ill-defined heterogeneous edema and soft tissue density with circumferential rectal wall thickening. This is likely related to patient's gastrointestinal bleed. No definite loculated fluid collection Electronically Signed   By: Prudencio Pair M.D.   On: 07/04/2019 04:04   Recent Labs    07/04/19 1132 07/05/19 0524  WBC 4.2 4.5  HGB 8.8* 8.8*  HCT 27.5* 27.4*  PLT 237 204   Recent Labs    07/03/19 0814 07/04/19 0756  NA 131* 135  K 4.4 3.9  CL 93* 98  CO2 22 25  GLUCOSE 88 90  BUN 45* 22*  CREATININE 10.17* 6.75*  CALCIUM 8.8* 8.9   No intake or output data in the 24 hours ending 07/05/19 0815   Physical Exam: Vital Signs Blood pressure (!) 185/111, pulse 71, temperature 98.6 F (37 C), temperature source Oral, resp. rate 18, weight 85.2 kg, SpO2 98 %. Constitutional: He is oriented to person, place, and time. No distress.  HENT:  Head: Normocephalic and atraumatic.  Eyes: Pupils are equal, round, and reactive to light. EOM are normal.  Neck: Normal range of motion. No tracheal deviation present. No thyromegaly present.  Cardiovascular: Normal rate and regular rhythm. Exam reveals no friction rub.  Murmur heard. Respiratory: Effort normal and breath sounds normal. No respiratory distress. He has no wheezes. He has no rales.  GI: Soft. He exhibits no distension. There is no abdominal tenderness.  Musculoskeletal:        General: No edema.     Comments: Bilateral pes planus deformities.   Neurological: He is alert and oriented to person, place, and time. No cranial nerve deficit.  UE 4-5/5. LE: 3/5 HF, KE and 3+ to 4/5 ADF/PF. Sensation 1/2 below the umbilicus for LT, pain and proprioception. DTR's trace in all 4's.   Skin:  He is not diaphoretic.  Psychiatric: Judgment and thought content normal.  Flat and disengaged but opened up a bit as we talked.      Assessment/Plan: 1. Functional deficits secondary to  T10 Spinal cord infarct which require 3+ hours per day of interdisciplinary therapy in a comprehensive inpatient rehab setting.  Physiatrist is providing close team supervision and 24 hour management of active medical problems listed below.  Physiatrist and rehab team continue to assess barriers to discharge/monitor patient progress toward functional and medical goals  Care Tool:  Bathing              Bathing assist       Upper Body Dressing/Undressing Upper body dressing        Upper body assist      Lower Body Dressing/Undressing Lower body dressing      What is the patient wearing?: Incontinence brief     Lower body assist Assist for lower body dressing: Maximal Assistance - Patient 25 - 49%     Toileting Toileting    Toileting assist Assist for toileting: Total Assistance - Patient < 25%     Transfers Chair/bed transfer  Transfers assist           Locomotion Ambulation   Ambulation assist              Walk 10 feet activity   Assist           Walk 50 feet activity   Assist           Walk 150 feet activity   Assist           Walk 10 feet on uneven surface  activity   Assist           Wheelchair     Assist               Wheelchair 50 feet with 2 turns activity    Assist            Wheelchair 150 feet activity     Assist          Blood pressure (!) 185/111, pulse 71, temperature 98.6 F (37 C), temperature source Oral, resp. rate 18, weight 85.2 kg, SpO2 98 %.  Medical Problem List and Plan: 1.  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  Antithrombotics:  -DVT/anticoagulation: SCDs.  LE vascular study requested             -antiplatelet therapy: Low-dose aspirin 81 mg daily 3. Pain Management: Hydrocodone increase to  q4h             -prn tylenol             -prn robaxin for spasms 4. Mood: Provide emotional support. Pt somewhat disheartened by multiple medical setbacks.              -antipsychotic agents: N/A 5. Neuropsych: This patient is capable of making decisions on his own behalf. 6. Skin/Wound Care: Routine skin checks 7. Fluids/Electrolytes/Nutrition: Routine in and outs with follow-up chemistries 8.  ID.  Continue vancomycin as well as Maxipime through 07/29/2019 per infectious disease 9.  Rectal ulcers/H. pylori.  Underwent EGD and flexible sigmoidoscopy 06/24/2019.  Follow-up per GI services.  Receiving Carafate enema x2 weeks as well as Flagyl x14 days initiated 06/26/2019 and tetracycline x14  days  -  -hgb stable  - GI following and have discussed with pt re: continuing enemas  -continue to follow hgbs daily and if any significant bleeding recurs he will need flex sig per Dr. Doyne Keel note today   10.  Acute on chronic normocytic anemia.  Patient did receive 2 units packed red blood cells 06/23/2019 and 1 unit PRBC 07/01/2019 and remains on Aranesp.  Follow CBC as above. 11.  End-stage renal disease.  Continue hemodialysis per renal services.             -HD after therapies to maximize rehab participation 12.  Thoracic aortic dissection.  Status post endovascular stent placement 06/06/2019.  Follow-up per Dr. Oneida Alar 13.  Neurogenic bowel bladder. I's and O's reviewed.   -He is essentially anuric.    -He is moving bowels, patterns somewhat skewed by GIB noted above. Continue to monitor for pattern 14.  Hypertension as well as history of first-degree AV block.  Norvasc 10 mg nightly, hydralazine 100 mg every 8 hours, labetalol 300 mg twice daily.  Monitor with increased mobility in therapies.  15.  Hyperlipidemia.  Lipitor 16.  Tobacco abuse.  Counseling    LOS: 1 days A FACE TO FACE EVALUATION WAS PERFORMED  Charlett Blake 07/05/2019, 8:15 AM

## 2019-07-06 ENCOUNTER — Inpatient Hospital Stay (HOSPITAL_COMMUNITY): Payer: Medicare Other

## 2019-07-06 ENCOUNTER — Encounter (HOSPITAL_COMMUNITY): Payer: Medicare Other

## 2019-07-06 DIAGNOSIS — M7989 Other specified soft tissue disorders: Secondary | ICD-10-CM

## 2019-07-06 LAB — CBC
HCT: 27.6 % — ABNORMAL LOW (ref 39.0–52.0)
Hemoglobin: 8.9 g/dL — ABNORMAL LOW (ref 13.0–17.0)
MCH: 29.2 pg (ref 26.0–34.0)
MCHC: 32.2 g/dL (ref 30.0–36.0)
MCV: 90.5 fL (ref 80.0–100.0)
Platelets: 170 10*3/uL (ref 150–400)
RBC: 3.05 MIL/uL — ABNORMAL LOW (ref 4.22–5.81)
RDW: 16 % — ABNORMAL HIGH (ref 11.5–15.5)
WBC: 4.7 10*3/uL (ref 4.0–10.5)
nRBC: 0 % (ref 0.0–0.2)

## 2019-07-06 MED ORDER — OXYCODONE HCL ER 10 MG PO T12A
10.0000 mg | EXTENDED_RELEASE_TABLET | Freq: Every day | ORAL | Status: DC
  Administered 2019-07-06 – 2019-07-26 (×21): 10 mg via ORAL
  Filled 2019-07-06 (×21): qty 1

## 2019-07-06 MED ORDER — SPIRONOLACTONE 25 MG PO TABS
25.0000 mg | ORAL_TABLET | Freq: Every day | ORAL | Status: DC
  Administered 2019-07-06 – 2019-07-28 (×22): 25 mg via ORAL
  Filled 2019-07-06 (×23): qty 1

## 2019-07-06 MED ORDER — HYDROCODONE-ACETAMINOPHEN 7.5-325 MG PO TABS
1.0000 | ORAL_TABLET | Freq: Four times a day (QID) | ORAL | Status: DC | PRN
  Administered 2019-07-06 – 2019-07-08 (×6): 1 via ORAL
  Filled 2019-07-06 (×6): qty 1

## 2019-07-06 MED ORDER — HYDROCODONE-ACETAMINOPHEN 7.5-325 MG PO TABS: 1.0000 | ORAL_TABLET | Freq: Three times a day (TID) | ORAL | Status: DC

## 2019-07-06 NOTE — Progress Notes (Signed)
Lower extremity venous has been completed.   Preliminary results in CV Proc.   Abram Sander 07/06/2019 9:26 AM

## 2019-07-06 NOTE — Progress Notes (Signed)
Occupational Therapy Session Note  Patient Details  Name: Frank Fuller MRN: [id number] Date of Birth: Jan 05, 1961  Today's Date: 07/06/2019 OT Missed Time: 56 Minutes Missed Time Reason: Patient fatigue   Pt received supine, requiring several cues to awaken. Pt upset and stating he did not sleep well last night. Pt politely requesting to get rest instead of 30 min scheduled OT session. 30 min missed.   Curtis Sites 07/06/2019, 4:46 PM

## 2019-07-06 NOTE — Progress Notes (Signed)
Patient ID: Frank Fuller, male   DOB: 07/07/1961, 58 y.o.   MRN: [id number]  [facility] Progress Note   Assessment/ Plan:   1. Spinal cord infarct/ paraperesis: per spinal MRI 8/23at T9- T11, following presentation with inability to move his legs. Neurology consulted, likely spinal artery injury related to aortic dissection. No plans for intervention per neurosurgery with continued treatment with aspirin recommended.  Now admitted to CIR for ongoing physical therapy/Occupational Therapy along with psychotherapy and pain management. 2. L4-L5 lumbar discitis: s/p recent aortic stentgraft. Plans noted for intravenous vancomycin and cefepime until 07/29/2019.  Earlier blood cultures negative..  3. Hematochezia- recurrent:GI consulted.Flex sig 9/1 showedstigmata of recent bleedinganddiffusely ulcerated rectum.EGD with fewnon-bleeding erosions in gastric antrum. Recommended miralax, carafate - try to limit < 2 weeks. Repeat Flex sig 9/4 withhemostatic spray to entire rectum.On Tetracycline + Flagyl as wellfor possible H.pylori. Hemodynamically stable overnight after transient admission to acute care hospital for monitoring/evaluation. 4. ESRD: Continue HD on TTS schedule - no heparin. Underwent hemodialysis yesterday without problems  5. RecentType B aortic dissection (s/p stent graft repair 8/14): Goal SBP per VVS 120-170. Known severe resistant HTN. Continue to avoid intradialytic hypotension. On ASA 6. Anemia(ESRD + ABLA):  On maximum dose Aranesp and transfusions when hemoglobin <7 g/deciliter. 7. Secondary hyperparathyroidism -Ca/phos in goal. Continue calcitriol. Parsabiv not on hospital formulary.Binders changed to Fosrenol d/t concern that Auryxia irritating his bowels. 8. HTN/volume: Blood pressures remain elevated on amlodipine 10 mg daily, hydralazine 100 mg 3 times a day, labetalol 300 mg twice a day and ultrafiltration with hemodialysis.  Heart rate  occasionally in the 60s and 70s prohibitive to up titration of labetalol.  I will start him on spironolactone 25 mg daily. 9.   Nutrition: Significant hypoalbuminemia noted likely following acute hospitalization/surgery.  Continue renal diet with ONS. 10. Recent MSSA bacteremia/AVG infection: S/p AVG removal andAncefcoursecompleted. 11. Depression - following recent sequence of clinical events.  Will reevaluate utility of antidepressant therapy.  Subjective:   Tired this morning after difficulty sleeping last night due to pain in his buttocks.  He discussed his pain management earlier with Dr. Letta Pate.  No problems with hemodialysis yesterday.   Objective:   BP (!) 175/86 (BP Location: Left Arm)   Pulse 85   Temp 98.5 F (36.9 C) (Oral)   Resp 19   Wt 83.1 kg   SpO2 99%   BMI 22.30 kg/m   Physical Exam: Gen: Comfortably resting in bed, withdrawn CVS: Pulse regular rhythm, normal rate, S1 and S2 normal Resp: Anteriorly clear to auscultation, no rales.  Right IJ TDC in situ Abd: Soft, flat, nontender Ext: No lower extremity edema  Labs: BMET Recent Labs  Lab 06/30/19 0642 07/01/19 0711 07/03/19 0814 07/04/19 0756 07/05/19 0929  NA 131* 131* 131* 135 135  K 4.5 4.9 4.4 3.9 4.4  CL 93* 94* 93* 98 97*  CO2 22 21* 22 25 22   GLUCOSE 97 99 88 90 112*  BUN 62* 74* 45* 22* 38*  CREATININE 10.69* 13.24* 10.17* 6.75* 9.48*  CALCIUM 8.4* 8.6* 8.8* 8.9 9.1  PHOS  --  7.5* 6.7* 5.4* 6.0*   CBC Recent Labs  Lab 07/04/19 0203  07/04/19 0758 07/04/19 1132 07/05/19 0524 07/06/19 0516  WBC 4.8   < > 4.2 4.2 4.5 4.7  NEUTROABS 3.1  --   --   --   --   --   HGB 8.6*   < > 8.5* 8.8* 8.8* 8.9*  HCT 26.7*   < >  27.0* 27.5* 27.4* 27.6*  MCV 91.8   < > 91.8 92.0 92.3 90.5  PLT 209   < > 223 237 204 170   < > = values in this interval not displayed.   Medications:    . amLODipine  10 mg Oral QHS  . atorvastatin  20 mg Oral q1800  . feeding supplement (NEPRO CARB STEADY)  237  mL Oral BID BM  . hydrALAZINE  100 mg Oral Q8H  . labetalol  300 mg Oral BID  . lanthanum  1,000 mg Oral TID WC  . metroNIDAZOLE  250 mg Oral TID WC & HS  . multivitamin  1 tablet Oral QHS  . NONFORMULARY OR COMPOUNDED ITEM 2 g  2 g Rectal BID  . oxyCODONE  10 mg Oral QHS  . pantoprazole (PROTONIX) IV  40 mg Intravenous Q12H  . tetracycline  500 mg Oral TID AC & HS   Elmarie Shiley, MD 07/06/2019, 8:45 AM

## 2019-07-06 NOTE — Progress Notes (Signed)
[place] PHYSICAL MEDICINE & REHABILITATION PROGRESS NOTE   Subjective/Complaints:  C/o buttocks pain an dpoor sleep , reviewed med admin list pt only tood 2 doses of hydrocodone yesterday  Reviewed "allergies"  Has had nausea with oxy in past (? Dose) Tolerating Hydrocodoen without c/o  ROS: Patient deniesnausea, vomiting, diarrhea, cough, shortness of breath or chest pain,    Objective:   No results found. Recent Labs    07/05/19 0524 07/06/19 0516  WBC 4.5 4.7  HGB 8.8* 8.9*  HCT 27.4* 27.6*  PLT 204 170   Recent Labs    07/04/19 0756 07/05/19 0929  NA 135 135  K 3.9 4.4  CL 98 97*  CO2 25 22  GLUCOSE 90 112*  BUN 22* 38*  CREATININE 6.75* 9.48*  CALCIUM 8.9 9.1    Intake/Output Summary (Last 24 hours) at 07/06/2019 0805 Last data filed at 07/06/2019 0630 Gross per 24 hour  Intake 540 ml  Output 2003 ml  Net -1463 ml     Physical Exam: Vital Signs Blood pressure (!) 175/86, pulse 85, temperature 98.5 F (36.9 C), temperature source Oral, resp. rate 19, weight 83.1 kg, SpO2 99 %. Constitutional: He is oriented to person, place, and time. No distress.  HENT:  Head: Normocephalic and atraumatic.  Eyes: Pupils are equal, round, and reactive to light. EOM are normal.  Neck: Normal range of motion. No tracheal deviation present. No thyromegaly present.  Cardiovascular: Normal rate and regular rhythm. Exam reveals no friction rub.  Murmur heard. Respiratory: Effort normal and breath sounds normal. No respiratory distress. He has no wheezes. He has no rales.  GI: Soft. He exhibits no distension. There is no abdominal tenderness.  Musculoskeletal:        General: No edema.     Comments: Bilateral pes planus deformities.   Neurological: He is alert and oriented to person, place, and time. No cranial nerve deficit.  UE 4-5/5. BLE: 3/5 HF, KE and 3+ to 4/5 ADF/PF. Sensation 1/2 below the umbilicus for LT, pain and proprioception. DTR's trace in all 4's.   Skin:  He is not diaphoretic.  Psychiatric: Judgment and thought content normal.  Flat and disengaged but opened up a bit as we talked.      Assessment/Plan: 1. Functional deficits secondary to T10 Spinal cord infarct which require 3+ hours per day of interdisciplinary therapy in a comprehensive inpatient rehab setting.  Physiatrist is providing close team supervision and 24 hour management of active medical problems listed below.  Physiatrist and rehab team continue to assess barriers to discharge/monitor patient progress toward functional and medical goals  Care Tool:  Bathing  Bathing activity did not occur: Refused           Bathing assist       Upper Body Dressing/Undressing Upper body dressing   What is the patient wearing?: Pull over shirt    Upper body assist Assist Level: Supervision/Verbal cueing    Lower Body Dressing/Undressing Lower body dressing      What is the patient wearing?: Incontinence brief     Lower body assist Assist for lower body dressing: Maximal Assistance - Patient 25 - 49%     Toileting Toileting    Toileting assist Assist for toileting: Total Assistance - Patient < 25%     Transfers Chair/bed transfer  Transfers assist     Chair/bed transfer assist level: Moderate Assistance - Patient 50 - 74%     Locomotion Ambulation   Ambulation assist  Ambulation activity did not occur: Safety/medical concerns          Walk 10 feet activity   Assist  Walk 10 feet activity did not occur: Safety/medical concerns        Walk 50 feet activity   Assist Walk 50 feet with 2 turns activity did not occur: Safety/medical concerns         Walk 150 feet activity   Assist Walk 150 feet activity did not occur: Safety/medical concerns         Walk 10 feet on uneven surface  activity   Assist Walk 10 feet on uneven surfaces activity did not occur: Safety/medical concerns         Wheelchair     Assist Will  patient use wheelchair at discharge?: Yes Type of Wheelchair: Manual    Wheelchair assist level: Supervision/Verbal cueing, Set up assist Max wheelchair distance: 139ft    Wheelchair 50 feet with 2 turns activity    Assist        Assist Level: Set up assist, Supervision/Verbal cueing   Wheelchair 150 feet activity     Assist  Wheelchair 150 feet activity did not occur: Safety/medical concerns       Blood pressure (!) 175/86, pulse 85, temperature 98.5 F (36.9 C), temperature source Oral, resp. rate 19, weight 83.1 kg, SpO2 99 %.  Medical Problem List and Plan: 1.  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  Antithrombotics:  -DVT/anticoagulation: SCDs.  LE vascular study requested             -antiplatelet therapy: Low-dose aspirin 81 mg daily 3. Pain Management: Hydrocodone q 6 h prn, add oxy CR at noc low dose, monitor for nausea              -prn tylenol             -prn robaxin for spasms 4. Mood: Provide emotional support. Pt somewhat disheartened by multiple medical setbacks.              -antipsychotic agents: N/A 5. Neuropsych: This patient is capable of making decisions on his own behalf. 6. Skin/Wound Care: Routine skin checks 7. Fluids/Electrolytes/Nutrition: Routine in and outs with follow-up chemistries 8.  ID.  Continue vancomycin as well as Maxipime through 07/29/2019 per infectious disease 9.  Rectal ulcers/H. pylori.  Underwent EGD and flexible sigmoidoscopy 06/24/2019.  Follow-up per GI services.  Receiving Carafate enema x2 weeks as well as Flagyl x14 days initiated 06/26/2019 and tetracycline x14 days  -  -hgb stable  - GI following and have discussed with pt re: continuing enemas  -continue to follow hgbs daily and if any significant bleeding recurs he will need flex sig per Dr. Doyne Keel  note today   10.  Acute on chronic normocytic anemia.  Patient did receive 2 units packed red blood cells 06/23/2019 and 1 unit PRBC 07/01/2019 and remains on Aranesp.  Follow CBC as above. 11.  End-stage renal disease.  Continue hemodialysis per renal services.             -HD after therapies to maximize rehab participation 12.  Thoracic aortic dissection.  Status post endovascular stent placement 06/06/2019.  Follow-up per Dr. Oneida Alar 13.  Neurogenic bowel bladder. I's and O's reviewed.   -He is essentially anuric.    -He is moving  bowels, patterns somewhat skewed by GIB noted above. Continue to monitor for pattern 14.  Hypertension as well as history of first-degree AV block.  Norvasc 10 mg nightly, hydralazine 100 mg every 8 hours, labetalol 300 mg twice daily.  Monitor with increased mobility in therapies. Hydralazine and labetolol just restarted monitor  Vitals:   07/05/19 2314 07/06/19 0513  BP: (!) 153/94 (!) 175/86  Pulse:  85  Resp:  19  Temp:    SpO2:  99%  15.  Hyperlipidemia.  Lipitor 16.  Tobacco abuse.  Counseling    LOS: 2 days A FACE TO FACE EVALUATION WAS PERFORMED  Frank Fuller 07/06/2019, 8:05 AM

## 2019-07-06 NOTE — IPOC Note (Addendum)
Overall Plan of Care [facility]) Patient Details Name: Frank Fuller MRN: [id number] DOB: 1961-06-26  Admitting Diagnosis: Myelopathy of thoracic region  Hospital Problems: Principal Problem:   Myelopathy of thoracic region Active Problems:   Discitis     Functional Problem List: Nursing    PT Balance, Perception, Safety, Behavior, Sensory, Endurance, Skin Integrity, Motor, Nutrition, Pain  OT Balance, Endurance, Motor, Pain  SLP    TR         Basic ADL's: OT Grooming, Bathing, Dressing, Toileting     Advanced  ADL's: OT Simple Meal Preparation, Laundry, Light Housekeeping     Transfers: PT Bed Mobility, Bed to Chair, Car, Furniture, Floor  OT Toilet, Metallurgist: PT Ambulation, Emergency planning/management officer, Stairs     Additional Impairments: OT None  SLP        TR      Anticipated Outcomes Item Anticipated Outcome  Self Feeding independent  Swallowing      Basic self-care  mod I  Toileting  mod I   Bathroom Transfers mod I/supervision  Bowel/Bladder     Transfers  supervision  Locomotion  mod assist short distance gait with LRAD  Communication     Cognition     Pain     Safety/Judgment      Therapy Plan: PT Intensity: Minimum of 1-2 x/day ,45 to 90 minutes PT Frequency: 5 out of 7 days PT Duration Estimated Length of Stay: ~3 weeks OT Intensity: Minimum of 1-2 x/day, 45 to 90 minutes OT Frequency: 5 out of 7 days OT Duration/Estimated Length of Stay: 2-3 weeks     Due to the current state of emergency, patients may not be receiving their 3-hours of Medicare-mandated therapy.   Team Interventions: Nursing Interventions    PT interventions Ambulation/gait training, Community reintegration, DME/adaptive equipment instruction, Neuromuscular re-education, Psychosocial support, Stair training, UE/LE Strength taining/ROM, Wheelchair propulsion/positioning, Training and development officer, Discharge planning, Functional electrical stimulation,  Pain management, Skin care/wound management, Therapeutic Activities, UE/LE Coordination activities, Cognitive remediation/compensation, Functional mobility training, Disease management/prevention, Patient/family education, Splinting/orthotics, Therapeutic Exercise, Visual/perceptual remediation/compensation  OT Interventions Balance/vestibular training, Self Care/advanced ADL retraining, Therapeutic Exercise, Wheelchair propulsion/positioning, DME/adaptive equipment instruction, Pain management, UE/LE Strength taining/ROM, Community reintegration, Barrister's clerk education, UE/LE Coordination activities, Discharge planning, Functional mobility training, Psychosocial support, Therapeutic Activities  SLP Interventions    TR Interventions    SW/CM Interventions Discharge Planning, Psychosocial Support, Patient/Family Education   Barriers to Discharge MD  Medical stability  Nursing      PT Decreased caregiver support, Inaccessible home environment, Lack of/limited family support, Neurogenic Bowel & Bladder, Incontinence, Home environment access/layout    OT Home environment access/layout    SLP      SW       Team Discharge Planning: Destination: PT-Home ,OT- Home , SLP-  Projected Follow-up: PT-Home health PT, 24 hour supervision/assistance, OT-  Home health OT, SLP-  Projected Equipment Needs: PT-To be determined, Other (comment), Wheelchair (measurements), Wheelchair cushion (measurements), OT- Sliding board, Wheelchair (measurements), Wheelchair cushion (measurements), SLP-  Equipment Details: PT- , OT-patient states that he has a drop arm commode, please confirm prior to discharge Patient/family involved in discharge planning: PT- Patient,  OT-Patient, SLP-   MD ELOS: 21-28d Medical Rehab Prognosis:  Good Assessment:  58 year old right-handed male with history of hypertension,tobacco abuse,end-stage renal disease with hemodialysis Tuesday Thursday Saturdaywith recent MSSA bacteremia/AVG  infection status post revision 03/12/2019 and then partial resection 04/12/2019,first-degree AV block,AAA status post stent, chronic anemia, hepatitis C, CVA. Per  chart review lives alone independent prior to admission. 1 level home 4 steps to entry.Patient does have a brother and sister in the area that checks on him routinely.Patient with long complicated medical course recently hospitalized 06/03/2019 to 06/12/2019 for dissection of thoracic aorta type B and was treated with endovascular stent graft with spinal drain per Dr. Oneida Alar on 06/06/2019. He was discharged home ambulating 60 feet modified independence but immediately returned with complaints of back and abdominal pain and fever as well as lower extremity weakness/paraplegia. He initially presented 06/13/2019 to [facility] noted fever 102.4, ESR 138, blood pressure 192/96, heart rate 107, WBC 17,900, hemoglobin 10.2, sodium 132, creatinine 5.8, COVID negative. CT angiogram of the chest abdomen and pelvis obtained showed interval repair of thoracic aortic dissection slight increase in left pleural effusion improved right lung nodular airspace disease likely infectious generalized ileus with slow bowel transient and large colonic stool burden with mild rectal wall thickening. Placed on broad-spectrum antibiotics and transferred to [facility] for further evaluation. Cranial CT scan showed left caudate head lacunar infarct new since prior study but appeared remote. No other acute abnormality. 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 More questionably abnormal spinal cord at T9-10 and T10-11 where central cord signal abnormality without cord expansion might indicate a segmental cord infarct and clinical setting. Nonspecific mild bilateral posterior paraspinal muscle inflammation at  the midthoracic level T6-T9. MRI lumbar spine showed new space-occupying material in the lower retroperitoneum and sub-sacral junction prevertebral space since CT of 06/13/2019 favoring acute discitis osteomyelitis. No drainable fluid collection abscess identified. Neurosurgery Dr. Newman Pies consulted no surgical intervention noted. Patient did undergo disc aspiration per interventional radiology 06/18/2019 with culture negative. Placed on 6-week antibiotic course lumbar discitis with vancomycin and cefepime for 6 weeks ending 07/29/2019. Hospital course complicated by multiple episodes of bloody bowel with clots FOBT positive with acute blood loss anemia. Gastroenterology consulted underwent EGD and flexible sigmoidoscopy 06/24/2019 with findings of rectal ulceration likely due to recent impactionand underwent single session of hemo-spray 06/27/2019.Patient's hemoglobin also noted to be 6.1-6.9 06/27/2019 and did receive 2 units packed red blood cells8/31/2020 and 1 unit packed red blood cells 9/8/2020with latest hemoglobin 8.0Patient placed on therapy for H. pylori with tetracycline and Flagyl x14 days.   Now requiring 24/7 Rehab RN,MD, as well as CIR level PT, OT and SLP.  Treatment team will focus on ADLs and mobility with goals set at Mod I /S WC level See Team Conference Notes for weekly updates to the plan of care

## 2019-07-07 ENCOUNTER — Inpatient Hospital Stay (HOSPITAL_COMMUNITY): Payer: Medicare Other | Admitting: Occupational Therapy

## 2019-07-07 ENCOUNTER — Other Ambulatory Visit: Payer: Medicare Other

## 2019-07-07 ENCOUNTER — Inpatient Hospital Stay (HOSPITAL_COMMUNITY): Payer: Medicare Other | Admitting: Physical Therapy

## 2019-07-07 DIAGNOSIS — G8220 Paraplegia, unspecified: Principal | ICD-10-CM

## 2019-07-07 LAB — CBC
HCT: 29.8 % — ABNORMAL LOW (ref 39.0–52.0)
Hemoglobin: 9.4 g/dL — ABNORMAL LOW (ref 13.0–17.0)
MCH: 29.3 pg (ref 26.0–34.0)
MCHC: 31.5 g/dL (ref 30.0–36.0)
MCV: 92.8 fL (ref 80.0–100.0)
Platelets: 194 10*3/uL (ref 150–400)
RBC: 3.21 MIL/uL — ABNORMAL LOW (ref 4.22–5.81)
RDW: 16.5 % — ABNORMAL HIGH (ref 11.5–15.5)
WBC: 4.4 10*3/uL (ref 4.0–10.5)
nRBC: 0 % (ref 0.0–0.2)

## 2019-07-07 MED ORDER — WITCH HAZEL-GLYCERIN EX PADS
MEDICATED_PAD | Freq: Four times a day (QID) | CUTANEOUS | Status: DC
  Administered 2019-07-07 – 2019-07-13 (×21): via TOPICAL
  Administered 2019-07-13: 1 via TOPICAL
  Administered 2019-07-14 (×4): via TOPICAL
  Administered 2019-07-15: 1 via TOPICAL
  Administered 2019-07-15 – 2019-07-24 (×25): via TOPICAL
  Filled 2019-07-07 (×3): qty 100

## 2019-07-07 MED ORDER — CHLORHEXIDINE GLUCONATE CLOTH 2 % EX PADS: 6.0000 | MEDICATED_PAD | Freq: Every day | CUTANEOUS | Status: DC

## 2019-07-07 MED ORDER — LIDOCAINE HCL URETHRAL/MUCOSAL 2 % EX GEL: 1.0000 "application " | Freq: Once | CUTANEOUS | Status: DC

## 2019-07-07 MED FILL — Sucralfate Susp 1 GM/10ML: ORAL | Qty: 20 | Status: AC

## 2019-07-07 NOTE — Progress Notes (Signed)
[facility] PHYSICAL MEDICINE & REHABILITATION PROGRESS NOTE   Subjective/Complaints:  Pt reports buttocks pain is actually rectal pain- describes as burning- since SCI/GI bleeding, so unsure of what's causing it.   ROS: Patient deniesnausea, vomiting, diarrhea, cough, shortness of breath or chest pain,    Objective:   Vas [place] Lower Extremity Venous (dvt)  Result Date: 07/06/2019  Lower Venous Study Indications: Swelling, and Edema.  Limitations: Patient positioning. Comparison Study: no prior Performing Technologist: Abram Sander RVS  Examination Guidelines: A complete evaluation includes B-mode imaging, spectral Doppler, color Doppler, and power Doppler as needed of all accessible portions of each vessel. Bilateral testing is considered an integral part of a complete examination. Limited examinations for reoccurring indications may be performed as noted.  +---------+---------------+---------+-----------+----------+--------------+ RIGHT    CompressibilityPhasicitySpontaneityPropertiesThrombus Aging +---------+---------------+---------+-----------+----------+--------------+ CFV      Full           Yes      Yes                                 +---------+---------------+---------+-----------+----------+--------------+ SFJ      Full                                                        +---------+---------------+---------+-----------+----------+--------------+ FV Prox  Full                                                        +---------+---------------+---------+-----------+----------+--------------+ FV Mid   Full                                                        +---------+---------------+---------+-----------+----------+--------------+ FV DistalFull                                                        +---------+---------------+---------+-----------+----------+--------------+ PFV      Full                                                         +---------+---------------+---------+-----------+----------+--------------+ POP      Full           Yes      Yes                                 +---------+---------------+---------+-----------+----------+--------------+ PTV      Full                                                        +---------+---------------+---------+-----------+----------+--------------+  PERO     Full                                                        +---------+---------------+---------+-----------+----------+--------------+   +---------+---------------+---------+-----------+----------+--------------+ LEFT     CompressibilityPhasicitySpontaneityPropertiesThrombus Aging +---------+---------------+---------+-----------+----------+--------------+ CFV      Full           Yes      Yes                                 +---------+---------------+---------+-----------+----------+--------------+ SFJ      Full                                                        +---------+---------------+---------+-----------+----------+--------------+ FV Prox  Full                                                        +---------+---------------+---------+-----------+----------+--------------+ FV Mid   Full                                                        +---------+---------------+---------+-----------+----------+--------------+ FV DistalFull                                                        +---------+---------------+---------+-----------+----------+--------------+ PFV      Full                                                        +---------+---------------+---------+-----------+----------+--------------+ POP      Full           Yes      Yes                                 +---------+---------------+---------+-----------+----------+--------------+ PTV                                                   Not visualized  +---------+---------------+---------+-----------+----------+--------------+ PERO                                                  Not visualized +---------+---------------+---------+-----------+----------+--------------+  Summary: Right: There is no evidence of deep vein thrombosis in the lower extremity. No cystic structure found in the popliteal fossa. Left: There is no evidence of deep vein thrombosis in the lower extremity. However, portions of this examination were limited- see technologist comments above. No cystic structure found in the popliteal fossa.  *See table(s) above for measurements and observations. Electronically signed by Servando Snare MD on 07/06/2019 at 11:36:28 AM.    Final    Recent Labs    07/06/19 0516 07/07/19 0641  WBC 4.7 4.4  HGB 8.9* 9.4*  HCT 27.6* 29.8*  PLT 170 194   Recent Labs    07/05/19 0929  NA 135  K 4.4  CL 97*  CO2 22  GLUCOSE 112*  BUN 38*  CREATININE 9.48*  CALCIUM 9.1    Intake/Output Summary (Last 24 hours) at 07/07/2019 1210 Last data filed at 07/07/2019 0807 Gross per 24 hour  Intake 480 ml  Output 3 ml  Net 477 ml     Physical Exam: Vital Signs Blood pressure (!) 180/94, pulse 83, temperature 98.9 F (37.2 C), temperature source Oral, resp. rate 16, weight 84.7 kg, SpO2 100 %. Constitutional: He is oriented to person, place, and time. No distress. Sitting up in bed, flat affect, appropriate otherwise HENT:  Head: Normocephalic and atraumatic.  Eyes: Pupils are equal, round, and reactive to light. EOM are normal.  Neck: Normal range of motion. No tracheal deviation present. No thyromegaly present.  Cardiovascular: Normal rate and regular rhythm. Exam reveals no friction rub.  Murmur heard. Respiratory: Effort normal and breath sounds normal. No respiratory distress. He has no wheezes. He has no rales.  GI: Soft. He exhibits no distension. There is no abdominal tenderness.  Musculoskeletal:        General: No edema.      Comments: Bilateral pes planus deformities.   Neurological: He is alert and oriented to person, place, and time. No cranial nerve deficit.  UE 4-5/5. BLE: 3/5 HF, KE and 3+ to 4/5 ADF/PF. Sensation 1/2 below the umbilicus for LT, pain and proprioception. DTR's trace in all 4's.   Skin: He is not diaphoretic.  Psychiatric: Judgment and thought content normal.  Flat and disengaged -didn't remember examiner.    Assessment/Plan: 1. Functional deficits secondary to T10 Spinal cord infarct which require 3+ hours per day of interdisciplinary therapy in a comprehensive inpatient rehab setting.  Physiatrist is providing close team supervision and 24 hour management of active medical problems listed below.  Physiatrist and rehab team continue to assess barriers to discharge/monitor patient progress toward functional and medical goals  Care Tool:  Bathing  Bathing activity did not occur: Refused           Bathing assist       Upper Body Dressing/Undressing Upper body dressing   What is the patient wearing?: Pull over shirt    Upper body assist Assist Level: Supervision/Verbal cueing    Lower Body Dressing/Undressing Lower body dressing      What is the patient wearing?: Incontinence brief     Lower body assist Assist for lower body dressing: Maximal Assistance - Patient 25 - 49%     Toileting Toileting    Toileting assist Assist for toileting: Maximal Assistance - Patient 25 - 49%     Transfers Chair/bed transfer  Transfers assist     Chair/bed transfer assist level: Moderate Assistance - Patient 50 - 74%     Locomotion Ambulation   Ambulation assist  Ambulation activity did not occur: Safety/medical concerns          Walk 10 feet activity   Assist  Walk 10 feet activity did not occur: Safety/medical concerns        Walk 50 feet activity   Assist Walk 50 feet with 2 turns activity did not occur: Safety/medical concerns         Walk 150  feet activity   Assist Walk 150 feet activity did not occur: Safety/medical concerns         Walk 10 feet on uneven surface  activity   Assist Walk 10 feet on uneven surfaces activity did not occur: Safety/medical concerns         Wheelchair     Assist Will patient use wheelchair at discharge?: Yes Type of Wheelchair: Manual    Wheelchair assist level: Supervision/Verbal cueing, Set up assist Max wheelchair distance: 119ft    Wheelchair 50 feet with 2 turns activity    Assist        Assist Level: Set up assist, Supervision/Verbal cueing   Wheelchair 150 feet activity     Assist  Wheelchair 150 feet activity did not occur: Safety/medical concerns       Blood pressure (!) 180/94, pulse 83, temperature 98.9 F (37.2 C), temperature source Oral, resp. rate 16, weight 84.7 kg, SpO2 100 %.  Medical Problem List and Plan: 1.  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  Antithrombotics:  -DVT/anticoagulation: SCDs.  LE vascular study requested             -antiplatelet therapy: Low-dose aspirin 81 mg daily 3. Pain Management: Hydrocodone q 6 h prn, add oxy CR at noc low dose, monitor for nausea              -prn tylenol             -prn robaxin for spasms  9/14- no nausea, however rectal pain still there- added tucks QID and lidocaine QID to see if would help- hard to use nerve pain meds in ESRD pt. 4. Mood: Provide emotional support. Pt somewhat disheartened by multiple medical setbacks.              -antipsychotic agents: N/A 5. Neuropsych: This patient is capable of making decisions on his own behalf. 6. Skin/Wound Care: Routine skin checks 7. Fluids/Electrolytes/Nutrition: Routine in and outs with follow-up chemistries 8.  ID.  Continue vancomycin as well as Maxipime through 07/29/2019 per  infectious disease 9.  Rectal ulcers/H. pylori.  Underwent EGD and flexible sigmoidoscopy 06/24/2019.  Follow-up per GI services.  Receiving Carafate enema x2 weeks as well as Flagyl x14 days initiated 06/26/2019 and tetracycline x14 days  -  -hgb stable  - GI following and have discussed with pt re: continuing enemas  -continue to follow hgbs daily and if any significant bleeding recurs he will need flex sig per Dr. Doyne Keel note today   10.  Acute on chronic normocytic anemia.  Patient did receive 2 units packed red blood cells 06/23/2019 and 1 unit PRBC 07/01/2019 and remains on Aranesp.  Follow CBC as above. 11.  End-stage renal disease.  Continue hemodialysis per renal services.             -HD after therapies to maximize rehab participation 12.  Thoracic aortic dissection.  Status post endovascular  stent placement 06/06/2019.  Follow-up per Dr. Oneida Alar 13.  Neurogenic bowel bladder. I's and O's reviewed.   -He is essentially anuric.    -He is moving bowels, patterns somewhat skewed by GIB noted above. Continue to monitor for pattern 14.  Hypertension as well as history of first-degree AV block.  Norvasc 10 mg nightly, hydralazine 100 mg every 8 hours, labetalol 300 mg twice daily.  Monitor with increased mobility in therapies. Hydralazine and labetolol just restarted monitor   9/14- BP still an issue- not due to AD, due to level, so likely due to underlying HTN- will assist Nephrology. Vitals:   07/06/19 1933 07/07/19 0355  BP: (!) 189/97 (!) 180/94  Pulse: 81 83  Resp: 16 16  Temp: 98.9 F (37.2 C) 98.9 F (37.2 C)  SpO2: 99% 100%  15.  Hyperlipidemia.  Lipitor 16.  Tobacco abuse.  Counseling    LOS: 3 days A FACE TO FACE EVALUATION WAS PERFORMED  Samyria Rudie 07/07/2019, 12:10 PM

## 2019-07-07 NOTE — Progress Notes (Signed)
Patient ID: Frank Fuller, male   DOB: Sep 24, 1961, 58 y.o.   MRN: [id number]  [facility] Progress Note   Assessment/ Plan:   1. Spinal cord infarct/ paraperesis: per spinal MRI 8/23at T9- T11, following presentation with inability to move his legs. Neuro, NSurg and VVS consulted, all felt etiology was likely spinal artery injury related to aortic dissection procedure.  All recommended supportive care and hope for recovery. Per NSurg surgery wouldn't help. Continue treatment with aspirin recommended.  Now admitted to CIR for ongoing physical therapy/Occupational Therapy along with psychotherapy and pain management. 2. L4-L5 lumbar discitis: s/p recent aortic stentgraft. Plans noted for intravenous vancomycin and cefepime until 07/29/2019.  Earlier blood cultures negative. 3. Hematochezia- recurrent:GI consulted.Flex sig 9/1 showedstigmata of recent bleedinganddiffusely ulcerated rectum.EGD with fewnon-bleeding erosions in gastric antrum. Rx'd w/ miralax, carafate < 2 weeks. Repeat Flex sig 9/4 withhemostatic spray to entire rectum.Rx'd Tetracycline + Flagyl for possible H.pylori. 4. ESRD: Continue HD on TTS schedule - no heparin. HD tomorrow 5. RecentType B aortic dissection (s/p stent graft repair 8/14): Goal SBP per VVS 120-170. Known severe resistant HTN. Continue to avoid intradialytic hypotension. On ASA 6. Anemia(ESRD + ABLA):  On maximum dose Aranesp and transfusions when hemoglobin <7 g/deciliter. 7. Secondary hyperparathyroidism -Ca/phos in goal. Continue calcitriol. Parsabiv not on hospital formulary.Binders changed to Fosrenol d/t concern that Auryxia irritating his bowels. 8. HTN/volume: Blood pressures remain elevated on amlodipine 10 mg daily, hydralazine 100 mg 3 times a day, labetalol 300 mg twice a day and ultrafiltration with hemodialysis.  Heart rate occasionally in the 60s and 70s prohibitive to up titration of labetalol.  I will start him on  spironolactone 25 mg daily. 9.   Nutrition: Significant hypoalbuminemia noted likely following acute hospitalization/surgery.  Continue renal diet with ONS. 10. Recent MSSA bacteremia/AVG infection: S/p AVG removal andAncefcoursecompleted. 11. Depression - following recent sequence of clinical events.  Will reevaluate utility of antidepressant therapy.  Subjective:   No c/o seen in room   Objective:   BP (!) 180/94 (BP Location: Left Arm)   Pulse 83   Temp 98.9 F (37.2 C) (Oral)   Resp 16   Wt 84.7 kg   SpO2 100%   BMI 22.73 kg/m   [facility] TTS 4:15hr,400/1.5,87.5kg,2/2.25, Profile #2, R TDC, no heparin. - Parsabiv 2.5mg  IV q HD  - Calcitriol 1.48mcg PO q HD - home BP Rx > labetalol 300 bid/ amlodipine 10 hs/ hydralazine 100 tid/ losartan 100 qd/ minoxidil 5 bid   Physical Exam: Gen: Comfortably resting in bed, withdrawn CVS: Pulse regular rhythm, normal rate, S1 and S2 normal Resp: Anteriorly clear to auscultation, no rales.  Right IJ TDC in situ Abd: Soft, flat, nontender Ext: No lower extremity edema  Labs: BMET Recent Labs  Lab 07/01/19 0711 07/03/19 0814 07/04/19 0756 07/05/19 0929  NA 131* 131* 135 135  K 4.9 4.4 3.9 4.4  CL 94* 93* 98 97*  CO2 21* 22 25 22   GLUCOSE 99 88 90 112*  BUN 74* 45* 22* 38*  CREATININE 13.24* 10.17* 6.75* 9.48*  CALCIUM 8.6* 8.8* 8.9 9.1  PHOS 7.5* 6.7* 5.4* 6.0*   CBC Recent Labs  Lab 07/04/19 0203  07/04/19 1132 07/05/19 0524 07/06/19 0516 07/07/19 0641  WBC 4.8   < > 4.2 4.5 4.7 4.4  NEUTROABS 3.1  --   --   --   --   --   HGB 8.6*   < > 8.8* 8.8* 8.9* 9.4*  HCT 26.7*   < >  27.5* 27.4* 27.6* 29.8*  MCV 91.8   < > 92.0 92.3 90.5 92.8  PLT 209   < > 237 204 170 194   < > = values in this interval not displayed.   Medications:    . amLODipine  10 mg Oral QHS  . atorvastatin  20 mg Oral q1800  . feeding supplement (NEPRO CARB STEADY)  237 mL Oral BID BM  . hydrALAZINE  100 mg Oral Q8H  . labetalol  300 mg  Oral BID  . lanthanum  1,000 mg Oral TID WC  . lidocaine  1 application Topical Once  . metroNIDAZOLE  250 mg Oral TID WC & HS  . multivitamin  1 tablet Oral QHS  . NONFORMULARY OR COMPOUNDED ITEM 2 g  2 g Rectal BID  . oxyCODONE  10 mg Oral QHS  . pantoprazole (PROTONIX) IV  40 mg Intravenous Q12H  . spironolactone  25 mg Oral Daily  . tetracycline  500 mg Oral TID AC & HS  . witch hazel-glycerin   Topical QID   Elmarie Shiley, MD 07/07/2019, 10:18 AM
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
## 2019-07-07 NOTE — Progress Notes (Signed)
Inpatient Rehabilitation  Patient information reviewed and entered into eRehab system by Zubair Lofton M. Zaquan Duffner, M.A., CCC/SLP, PPS Coordinator.  Information including medical coding, functional ability and quality indicators will be reviewed and updated through discharge.    

## 2019-07-07 NOTE — Progress Notes (Signed)
Occupational Therapy Session Note  Patient Details  Name: Frank Fuller MRN: [id number] Date of Birth: [birth year]  Today's Date: 07/07/2019 OT Individual Time: 0930-1030 OT Individual Time Calculation (min): 60 min    Short Term Goals: Week 1:  OT Short Term Goal 1 (Week 1): patient will complete functional transfers with SB CGA with good carryover of w/c set up OT Short Term Goal 2 (Week 1): patient will complete LB bathing and dressing with min A in safe position with ADs as needed OT Short Term Goal 3 (Week 1): patient will tolerate OOB for 3 hours with good carryover of weight shifts  Skilled Therapeutic Interventions/Progress Updates:    Patient in bed, ready for therapy session.  Patient incontinent of loose bowel - he was unable to tell that he had a BM - max/dependent for hygiene, cleaning of linens and to doff/donn brief.  Patient tearful at times due to inability to control bowels.  LB bathing with min a completed LSP in bed, LB dressing min A LSP in bed, slipper socks with min A.  Supine to SSP edge of bed with CS, min cues for safety.  SB transfer bed to/from w/c with CG/min a  And assistance for board placement, w/c set up and cues for clearing buttocks.  UB bathing/dressing and grooming completed w/c level with set up/mod I.   Patient returned to bed, completed long sit balance and positioning activities, he remained in bed at close of session with bed alarm set and call bell/tray table in reach.    Therapy Documentation Precautions:  Precautions Precautions: Fall Precaution Comments: paraplegic Restrictions Weight Bearing Restrictions: No General:   Vital Signs: Therapy Vitals Temp: 98.2 F (36.8 C) Temp Source: Oral Pulse Rate: 80 Resp: 16 BP: (!) 174/95 Patient Position (if appropriate): Lying Oxygen Therapy SpO2: 100 % O2 Device: Room Air Pain: Pain Assessment Pain Scale: 0-10 Pain Score: 4  Pain Location: Buttocks Pain Descriptors / Indicators: Sore Pain  Intervention(s): Repositioned   Therapy/Group: Individual Therapy  Carlos Levering 07/07/2019, 4:05 PM

## 2019-07-07 NOTE — Progress Notes (Signed)
Physical Therapy Session Note  Patient Details  Name: Frank Fuller MRN: [id number] Date of Birth: August 29, 1961  Today's Date: 07/07/2019 PT Individual Time: 1305-1407 and 1433-1530 PT Individual Time Calculation (min): 62 min and 57 min  Short Term Goals: Week 1:  PT Short Term Goal 1 (Week 1): Pt will perform supine<>sit with CGA PT Short Term Goal 2 (Week 1): Pt will perform bed<>chair transfers with CGA PT Short Term Goal 3 (Week 1): Pt will initiate sit<>stand transfers and gait training PT Short Term Goal 4 (Week 1): Pt will demonstrate understanding of w/c parts with min cuing and min assist to set-up for bed<>chair transfers  Skilled Therapeutic Interventions/Progress Updates:    Session 1: Pt received supine in bed and agreeable to therapy session. Donned socks in bed demonstrating carryover of long sitting using bedrails from OT session this AM with supervision. Therapist educated pt on importance of sidelying for pressure relief while in the bed to maintain skin integrity. Supine>sit, heavily relying on bedrails, with supervision and increased time. R lateral scoot transfer EOB>w/c using transfer board with pt able to place board without assist and min assist for balance/trunk control while scooting - pt demonstrates good awareness of sequencing of transfer with repositioning of LEs throughout. Pt suddenly becoming emotional stating his brother is planning to come later today and the pt doesn't want his brother to see him like this as the pt states he feels like a "failure" therapist provided emotional support and educated pt on the therapy POC to increase pt independence prior to D/C home, pt in improved spirits. Therapist reinforced prior education regarding w/c parts (brakes, wheel bars, and donning/doffing leg rests). B UE w/c propulsion ~174ft to therapy gym with supervision. Sit<>stand x2 w/c<>// bars with min assist for lifting into standing, guarding knee buckling, and balance - cuing  for adjusting grip on bar for improved upright stance and cuing for increased hip/knee extension - pt able to tolerate standing ~10 seconds each time. Reports he has been incontinent of bowels. Transported back to room in w/c. Pt able to set-up w/c for L lateral scoot transfer back to bed with min cuing. Slide board transfer w/c>EOB with min assist for trunk steadying/balance. Sit>supine with min assist for B LE management. Rolling R/L in bed using bedrails with supervision and pt demonstrating improved management of LEs. Bridging to doff pants with assist for foot placement. Therapist performed total assist posterior peri-care and set-up assist for anterior peri-care. Pt left R sidelying in bed with pillows positioned for increased pressure relief, bed alarm on, and pt's brother present.  Session 2: Pt received L sidelying in bed and agreeable to therapy session. Sidelying>sitting with heavy reliance on bedrails with close supervision. R lateral scoot transfer EOB>w/c using transfer board with min assist for trunk balance - pt able to manage board without hands-on assist. Pt able to demonstrate understanding of earlier education regarding w/c parts with min cuing. B UE w/c propulsion ~148ft to therapy gym with supervision. Sit<>stands in // bars x5 with min assist for lifting into standing - cuing for scooting hips towards front of w/c prior to initiating standing - cuing for adjusting grasp on // bars for improved upright posture in stance. Progressed to stepping R LE forward/backwards 2 sets x 6 reps (seated break between each) with min assist from therapist for guarding L knee buckling and for balance - then performed stepping L LE forward/backwards 1 set x 6 reps with min assist again for balance and guarding  R LE knee - pt then reporting significant fatigue and defers further standing. Pt set-up for R lateral scoot transfer w/c>EOM using transfer board with min cuing for proper set-up as pt left L foot on  leg rest and stated he thought it was on the ground. Sit>supine with min assist for B LE management.  Performed the following supine exercises targeting B LE NMR: - bridging with min assist to maintain LE alignment to prevent L knee falling in/out x 15 reps with multimodal cuing for proper technique and manual facilitation for increased B LE weightbearing  - heel slides targeting hip/knee flexion again with pt requiring assist for B LEs (R>L) to maintain knee alignment and to perform the movement  1set x10 reps and 1set x5 reps each LE - multimodal cuing throughout for proper technique and improved muscle activation Pt reports he feels that he has had another BM. Supine>sit with min assist for B LE management. L lateral scoot transfer EOM>w/c using transfer board with min assist. B UE w/c propulsion ~5ft towards room then therapist transported remainder of distance. Pt set-up for L scoot transfer w/c>EOB with min cuing then pt educated on performing squat pivot transfer with min assist for lifting/pivoting hips. Sit>supine with min assist for B LE management. Pt left supine in bed with nursing staff present to assist with peri-care and assume care of patient.  Therapy Documentation Precautions:  Precautions Precautions: Fall Precaution Comments: paraplegic Restrictions Weight Bearing Restrictions: No  Pain: Session 1: Pt reporting significant buttocks/rectal pain stating "it feels like someone is ripping my butt cheeks apart." During peri-care pt reports a lot of rectal pain and nursing staff present to use witch hazel pads and place barrier cream. Positioned pt in sidelying for pain management at end of session.  Session 2: Pt reports that the witch hazel and/or barrier cream is helping with his rectal pain.    Therapy/Group: Individual Therapy  Tawana Scale, PT, DPT 07/07/2019, 12:54 PM

## 2019-07-07 NOTE — Progress Notes (Signed)
Social Work Patient ID: Frank Fuller, male   DOB: Dec 24, 1960, 58 y.o.   MRN: [id number]   Attempted to complete assessment interview, however, pt refusing at this time.  Will follow up this afternoon.  Ridley Schewe, LCSW

## 2019-07-08 ENCOUNTER — Inpatient Hospital Stay (HOSPITAL_COMMUNITY): Payer: Medicare Other | Admitting: Occupational Therapy

## 2019-07-08 ENCOUNTER — Inpatient Hospital Stay (HOSPITAL_COMMUNITY): Payer: Medicare Other | Admitting: Physical Therapy

## 2019-07-08 LAB — VANCOMYCIN, RANDOM: Vancomycin Rm: 20

## 2019-07-08 LAB — CBC
HCT: 28.3 % — ABNORMAL LOW (ref 39.0–52.0)
Hemoglobin: 9 g/dL — ABNORMAL LOW (ref 13.0–17.0)
MCH: 29.4 pg (ref 26.0–34.0)
MCHC: 31.8 g/dL (ref 30.0–36.0)
MCV: 92.5 fL (ref 80.0–100.0)
Platelets: 169 10*3/uL (ref 150–400)
RBC: 3.06 MIL/uL — ABNORMAL LOW (ref 4.22–5.81)
RDW: 16.9 % — ABNORMAL HIGH (ref 11.5–15.5)
WBC: 4.4 10*3/uL (ref 4.0–10.5)
nRBC: 0 % (ref 0.0–0.2)

## 2019-07-08 LAB — CULTURE, BLOOD (ROUTINE X 2)
Culture: NO GROWTH
Culture: NO GROWTH
Special Requests: ADEQUATE
Special Requests: ADEQUATE

## 2019-07-08 MED ORDER — NONFORMULARY OR COMPOUNDED ITEM
2.0000 g | Freq: Two times a day (BID) | Status: DC
  Administered 2019-07-09 – 2019-07-20 (×14): 2 g via RECTAL
  Filled 2019-07-08 (×30): qty 1

## 2019-07-08 MED ORDER — HEPARIN SODIUM (PORCINE) 1000 UNIT/ML IJ SOLN
INTRAMUSCULAR | Status: AC
  Filled 2019-07-08: qty 4

## 2019-07-08 MED ORDER — HYDROCODONE-ACETAMINOPHEN 7.5-325 MG PO TABS
1.0000 | ORAL_TABLET | ORAL | Status: DC | PRN
  Administered 2019-07-08 – 2019-07-09 (×4): 1 via ORAL
  Filled 2019-07-08 (×5): qty 1

## 2019-07-08 MED ORDER — VANCOMYCIN HCL IN DEXTROSE 1-5 GM/200ML-% IV SOLN
INTRAVENOUS | Status: AC
  Filled 2019-07-08: qty 200

## 2019-07-08 NOTE — Progress Notes (Signed)
Physical Therapy Session Note  Patient Details  Name: Frank Fuller MRN: [id number] Date of Birth: 1961/06/20  Today's Date: 07/08/2019 PT Individual Time: 1100-1210 PT Individual Time Calculation (min): 70 min   Short Term Goals: Week 1:  PT Short Term Goal 1 (Week 1): Pt will perform supine<>sit with CGA PT Short Term Goal 2 (Week 1): Pt will perform bed<>chair transfers with CGA PT Short Term Goal 3 (Week 1): Pt will initiate sit<>stand transfers and gait training PT Short Term Goal 4 (Week 1): Pt will demonstrate understanding of w/c parts with min cuing and min assist to set-up for bed<>chair transfers  Skilled Therapeutic Interventions/Progress Updates:    Pt received seated in bed, agreeable to PT session. No complaints of pain. Supine to sitting EOB with CGA with HOB elevated. Pt is min A to don shoes while seated EOB. Slide board transfer bed to w/c with CGA. Pt has incontinence of bowel during transfer. Slide board transfer back to bed CGA. Sit to supine min A for RLE management. Rolling L/R with Supervision with use of bedrails for dependent brief change and pericare. Supine to sit CGA. Slide board transfer bed to w/c CGA. Manual w/c propulsion x 150 ft with use of BUE and Supervision. Per OT pt was dizzy and had some visual disturbances this AM. Pt does not report feeling dizzy during this session, seated BP 165/95. Per pt his BP has been running high. Sit to stand in // bars with min A, R knee blocked. Pt has incontinence of bowel in standing. Assisted pt back to room. Sit to stand x 3 reps to stedy with mod A for brief change and pericare. Pt has continuous incontinence of bowel with exertion from standing. Stedy transfer back to bed. Sit to supine min A. Dependent for pericare and donning of new brief. Pt frequently emotionally labile throughout session and tearful with clear frustration with ongoing bowel incontinence and embarrassment with regards to bowels. Provided emotional support  to patient and discussed bowel program for regulating bowels. Pt left supine in bed with needs in reach at end of session.  Therapy Documentation Precautions:  Precautions Precautions: Fall Precaution Comments: paraplegic Restrictions Weight Bearing Restrictions: No    Therapy/Group: Individual Therapy   Excell Seltzer, PT, DPT  07/08/2019, 12:55 PM

## 2019-07-08 NOTE — Progress Notes (Signed)
Pt c/o dizziness, and loss of visual focus. Upon assessment BP 171/121, P 79, R 16, O2 Sat 100. Pt A&O x 4. No signs of labored breathing or distress.  Dan Angiulli-PA notified. No new orders, Will continue to monitor. Larina Earthly, LPN

## 2019-07-08 NOTE — Progress Notes (Signed)
[facility] PHYSICAL MEDICINE & REHABILITATION PROGRESS NOTE   Subjective/Complaints:  Pt reports still having rectal pain- he thinks it's from rectal ulcer- although he describes as burning, doesn't think it' s nerve pain. Wants to try to increase Norco frequency vs trying to do nerve pain meds- we can try- will increase Norco frequency to q4 hours prn.  Pt admitted Lidocaine jelly "helped a little" when used yesterday for rectal pain.  Said he doesn't want to go to [place]- willing to travel to [place], but not [place] for rehab- explained they don't have inpt rehab in [place]- he said he still won't go because he talked to his brother.   ROS: Patient denies nausea, vomiting, diarrhea, cough, shortness of breath or chest pain,    Objective:   No results found. Recent Labs    07/07/19 0641 07/08/19 0509  WBC 4.4 4.4  HGB 9.4* 9.0*  HCT 29.8* 28.3*  PLT 194 169   No results for input(s): NA, K, CL, CO2, GLUCOSE, BUN, CREATININE, CALCIUM in the last 72 hours.  Intake/Output Summary (Last 24 hours) at 07/08/2019 1404 Last data filed at 07/08/2019 0907 Gross per 24 hour  Intake 1130 ml  Output 1 ml  Net 1129 ml     Physical Exam: Vital Signs Blood pressure (!) 158/80, pulse 62, temperature 98 F (36.7 C), temperature source Oral, resp. rate 16, weight 86.8 kg, SpO2 100 %. Constitutional: He is oriented to person, place, and time. No distress. Sitting up im manual w/c- just transferred with PT; still flat affect, smiled x1,  NAD HENT:  Head: Normocephalic and atraumatic.  Eyes:  EOM are normal.  Neck: Normal range of motion. No tracheal deviation present. No thyromegaly present.  Cardiovascular: Normal rate and regular rhythm. E Murmur heard. Respiratory: CTA B/L GI: Soft. NT, ND  Musculoskeletal:        General: No edema.     Comments: Bilateral pes planus deformities.   Neurological: He is alert and oriented to person, place, and time. No cranial nerve deficit.  UE 4-5/5.  BLE: 3/5 HF, KE and 3+ to 4/5 ADF/PF. Sensation 1/2 below the umbilicus for LT, pain and proprioception. DTR's trace in all 4's.   Skin: He is not diaphoretic.  Psychiatric: Judgment and thought content normal.  Flat but remembered people in room from yesterday    Assessment/Plan: 1. Functional deficits secondary to T10 Spinal cord infarct which require 3+ hours per day of interdisciplinary therapy in a comprehensive inpatient rehab setting.  Physiatrist is providing close team supervision and 24 hour management of active medical problems listed below.  Physiatrist and rehab team continue to assess barriers to discharge/monitor patient progress toward functional and medical goals  Care Tool:  Bathing  Bathing activity did not occur: Refused Body parts bathed by patient: Right arm, Left arm, Chest, Abdomen, Front perineal area, Right upper leg, Left upper leg, Right lower leg, Left lower leg, Face   Body parts bathed by helper: Buttocks     Bathing assist Assist Level: Minimal Assistance - Patient > 75%     Upper Body Dressing/Undressing Upper body dressing   What is the patient wearing?: Pull over shirt    Upper body assist Assist Level: Set up assist    Lower Body Dressing/Undressing Lower body dressing      What is the patient wearing?: Pants     Lower body assist Assist for lower body dressing: Supervision/Verbal cueing(bed level)     Toileting Toileting  Toileting assist Assist for toileting: Maximal Assistance - Patient 25 - 49%     Transfers Chair/bed transfer  Transfers assist     Chair/bed transfer assist level: Contact Guard/Touching assist     Locomotion Ambulation   Ambulation assist   Ambulation activity did not occur: Safety/medical concerns          Walk 10 feet activity   Assist  Walk 10 feet activity did not occur: Safety/medical concerns        Walk 50 feet activity   Assist Walk 50 feet with 2 turns activity did not  occur: Safety/medical concerns         Walk 150 feet activity   Assist Walk 150 feet activity did not occur: Safety/medical concerns         Walk 10 feet on uneven surface  activity   Assist Walk 10 feet on uneven surfaces activity did not occur: Safety/medical concerns         Wheelchair     Assist Will patient use wheelchair at discharge?: Yes Type of Wheelchair: Manual    Wheelchair assist level: Supervision/Verbal cueing Max wheelchair distance: 150'    Wheelchair 50 feet with 2 turns activity    Assist        Assist Level: Supervision/Verbal cueing   Wheelchair 150 feet activity     Assist  Wheelchair 150 feet activity did not occur: Safety/medical concerns   Assist Level: Supervision/Verbal cueing   Blood pressure (!) 158/80, pulse 62, temperature 98 F (36.7 C), temperature source Oral, resp. rate 16, weight 86.8 kg, SpO2 100 %.  Medical Problem List and Plan: 1.  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  Antithrombotics:  -DVT/anticoagulation: SCDs.  LE vascular study requested             -antiplatelet therapy: Low-dose aspirin 81 mg daily 3. Pain Management: Hydrocodone q 6 h prn, add oxy CR at noc low dose, monitor for nausea   9/15- increase to q4 hours prn             -prn tylenol             -prn robaxin for spasms  9/14- no nausea, however rectal pain still there- added tucks QID and lidocaine QID to see if would help- hard to use nerve pain meds in ESRD pt. 4. Mood: Provide emotional support. Pt somewhat disheartened by multiple medical setbacks.              -antipsychotic agents: N/A 5. Neuropsych: This patient is capable of making decisions on his own behalf. 6. Skin/Wound Care: Routine skin checks 7. Fluids/Electrolytes/Nutrition: Routine in and outs with  follow-up chemistries 8.  ID.  Continue vancomycin as well as Maxipime through 07/29/2019 per infectious disease 9.  Rectal ulcers/H. pylori.  Underwent EGD and flexible sigmoidoscopy 06/24/2019.  Follow-up per GI services.  Receiving Carafate enema x2 weeks as well as Flagyl x14 days initiated 06/26/2019 and tetracycline x14 days  - GI following and have discussed with pt re: continuing enemas  -continue to follow hgbs daily and if any significant bleeding recurs he will need flex sig per Dr. Doyne Keel note today  9/15- denies bleeding; Hb 9.0 from 9.4 yesterday -will recheck in AM. Pharmacy following IV ABX regimen 10.  Acute on chronic normocytic anemia.  Patient did receive 2  units packed red blood cells 06/23/2019 and 1 unit PRBC 07/01/2019 and remains on Aranesp.  Follow CBC as above. 11.  End-stage renal disease.  Continue hemodialysis per renal services.             -HD after therapies to maximize rehab participation 12.  Thoracic aortic dissection.  Status post endovascular stent placement 06/06/2019.  Follow-up per Dr. Oneida Alar 13.  Neurogenic bowel bladder. I's and O's reviewed.   -He is essentially anuric.    -He is moving bowels, patterns somewhat skewed by GIB noted above. Continue to monitor for pattern 14.  Hypertension as well as history of first-degree AV block.  Norvasc 10 mg nightly, hydralazine 100 mg every 8 hours, labetalol 300 mg twice daily.  Monitor with increased mobility in therapies. Hydralazine and labetolol just restarted monitor   9/14- BP still an issue- not due to AD, due to level, so likely due to underlying HTN- will assist Nephrology. Vitals:   07/08/19 1035 07/08/19 1315  BP: (!) 171/121 (!) 158/80  Pulse: 79 62  Resp: 16 16  Temp:  98 F (36.7 C)  SpO2: 100% 100%  15.  Hyperlipidemia.  Lipitor 16.  Tobacco abuse.  Counseling 17. Dispo  9/15- doesn't want to go to [place] afterwards.    LOS: 4 days A FACE TO FACE EVALUATION WAS PERFORMED  Wilkin Lippy 07/08/2019, 2:04 PM

## 2019-07-08 NOTE — Progress Notes (Signed)
Pharmacy Antibiotic Note  Frank Fuller is a 58 y.o. male admitted on 07/04/2019 with Osteomyelitis.  Pharmacy has been consulted for Vanco/Cefepime dosing.   Patient continues on Vancomycin and Cefepime for vertebral osteomyelitis/discitis until 10/6 per ID recommendations. Patient is also on Flagyl and Tetracycline for H. Pylori treatment through 9/17. WBC is wnl. Patient remains afebrile. Pre-HD vancomycin level today is therapeutic for goal at 20 (goal 15-25 mcg/mL) on current dose of Vancomycin 1 gram post-HD Tuesday/Thursday/Saturday.    8/23 BCx >> neg 8/25 IR disc aspirate >> neg   Plan: - Continue Vanc 1 gram post HD-TTS - Consider a weekly pre-HD VR - next 9/22 - Continue cefepime 2 grams post HD TTS.  - ID recs 6 weeks IV Vanc + Cefepime  Weight: 183 lb 10.3 oz (83.3 kg)  Temp (24hrs), Avg:98.6 F (37 C), Min:98.2 F (36.8 C), Max:99.4 F (37.4 C)  Recent Labs  Lab 07/03/19 0814  07/04/19 0756  07/04/19 1132 07/05/19 0524 07/05/19 0929 07/06/19 0516 07/07/19 0641 07/08/19 0509  WBC  --    < > 4.1   < > 4.2 4.5  --  4.7 4.4 4.4  CREATININE 10.17*  --  6.75*  --   --   --  9.48*  --   --   --   VANCORANDOM  --   --   --   --   --   --   --   --   --  20   < > = values in this interval not displayed.    Estimated Creatinine Clearance: 9.8 mL/min (A) (by C-G formula based on SCr of 9.48 mg/dL (H)).    Allergies  Allergen Reactions  . Oxycodone Nausea Only  . Pramoxine     Other reaction(s): Skin Rash  . Chlorhexidine Other (See Comments)    Unknown reaction Patch skin test done at dialysis 06/26/17  - staff using clear dressing and alcohol to clean exit site of catheter  . Clonidine Derivatives Other (See Comments)    Dizziness   . Fluorouracil     Other reaction(s): Skin Rash  . Lisinopril Other (See Comments)    unresponsive  . Sensipar  [Cinacalcet Hcl]     Other reaction(s): agitation  . Carvedilol Rash    Sloan Leiter, PharmD, BCPS,  BCCCP Clinical Pharmacist Please refer to [facility] for [place] numbers 07/08/2019 8:12 AM

## 2019-07-08 NOTE — Progress Notes (Signed)
Occupational Therapy Session Note  Patient Details  Name: Frank Fuller MRN: [id number] Date of Birth: 1960/11/12  Today's Date: 07/08/2019 OT Individual Time: [id number] OT Individual Time Calculation (min): 60 min    Short Term Goals: Week 1:  OT Short Term Goal 1 (Week 1): patient will complete functional transfers with SB CGA with good carryover of w/c set up OT Short Term Goal 2 (Week 1): patient will complete LB bathing and dressing with min A in safe position with ADs as needed OT Short Term Goal 3 (Week 1): patient will tolerate OOB for 3 hours with good carryover of weight shifts  Skilled Therapeutic Interventions/Progress Updates:    Pt seen for OT ADL bathing/dressing session. Pt awake in supine upon arrival, agreeable to tx session. Complaints of nerve pain in B LEs, reports being up to date on all meds and denied need for intervention.  Transfers: Supine>sitting EOB with supervision using hospital bed functions. Sliding board transfer EOB>w/c with assist to place and stabilize board and guarding assist with VCs for technique.  Sit>stand in STEDY with min A overall, mod A for controlled descent back into w/c.    ADL re-training: Donned boxers at bed level, threading LEs from supine with increased time and rest breaks required. Returned to sitting EOB and completed lateral leans in order to pull pants up with supervision. Declined completing bathing/dressing from EOB, opting to go to w/c to complete. Completed UB bathing w/c level with set-up. Sit>stand in STEDY for clothing management, total A to pull pants down due to poor standing balance. Returned to w/c to thread on pants with increased time and rest breaks provided. Stood again into STEDY and noticated to have been incontinent of bowel. Pt did not feel he would be able to stand long enough in order for hygiene to be completed. Therefore used STEDY to transition back to bed.  Total A to don new brief, pt able to assist with  hygiene at bed level.  Education: Extensive education/discussion regarding bowel/bladder program, SCI and functional implications.   Pt becoming tearful following incontinence and needing to be cleaned. Provided encouragement/empathetic listening and support regarding activity progression.  Pt left in supine with all needs in reach and bed alarm on.     Therapy Documentation Precautions:  Precautions Precautions: Fall Precaution Comments: paraplegic Restrictions Weight Bearing Restrictions: No   Therapy/Group: Individual Therapy  Suad Autrey L 07/08/2019, 7:07 AM

## 2019-07-08 NOTE — Progress Notes (Signed)
Occupational Therapy Session Note  Patient Details  Name: Frank Fuller MRN: [id number] Date of Birth: July 29, 1961  Today's Date: 07/08/2019 OT Individual Time: [id number] OT Individual Time Calculation (min): 60 min    Short Term Goals: Week 1:  OT Short Term Goal 1 (Week 1): patient will complete functional transfers with SB CGA with good carryover of w/c set up OT Short Term Goal 2 (Week 1): patient will complete LB bathing and dressing with min A in safe position with ADs as needed OT Short Term Goal 3 (Week 1): patient will tolerate OOB for 3 hours with good carryover of weight shifts  Skilled Therapeutic Interventions/Progress Updates:    Treatment session with focus on trunk control to increase independence and safety with self-care tasks. Pt attempted to don pants in long sitting, however reports increased dizziness upon sitting upright.  Returned to supine with pt reporting minimal improvement in symptoms.  Pt returned to sitting upright in bed, completed threading of pant legs in circle sitting and then transitioned to EOB to engage in lateral leans to pull pants over hips.  Vitals assessed seated EOB with pt with reports of increased dizziness.  Pt returned to supine in bed and completed LB dressing with bridging technique.  Pt reports increased blurriness of vision - notified RN of dizziness and vision complaints.  RN assessed and notified PA.  Engaged in further focus on trunk control and strengthening with hip rotation with focus on motor control of BLE and bridging.  Pt unable to achieve full bridge position.  Educated on motor control, strengthening as needed for sitting balance to complete self-care tasks.  Pt reports difficulty of each activity, reporting feeling like he has "run a marathon".  Pt reports vision still different, but dizziness subsided in semi-reclined position.  Therapy Documentation Precautions:  Precautions Precautions: Fall Precaution Comments:  paraplegic Restrictions Weight Bearing Restrictions: No General:   Vital Signs: Therapy Vitals Pulse Rate: 79 Resp: 16 BP: (!) 171/121 Patient Position (if appropriate): Sitting Oxygen Therapy SpO2: 100 % Pain: Pain Assessment Pain Scale: 0-10 Pain Score: 8  Pain Type: Acute pain Pain Location: Generalized Pain Radiating Towards: legs Pain Descriptors / Indicators: Aching;Discomfort Pain Frequency: Constant Pain Onset: On-going Patients Stated Pain Goal: 0 Pain Intervention(s): Medication (See eMAR)   Therapy/Group: Individual Therapy  Simonne Come 07/08/2019, 12:08 PM

## 2019-07-09 ENCOUNTER — Encounter (HOSPITAL_COMMUNITY): Payer: Medicare Other | Admitting: Psychology

## 2019-07-09 ENCOUNTER — Inpatient Hospital Stay (HOSPITAL_COMMUNITY): Payer: Medicare Other | Admitting: Physical Therapy

## 2019-07-09 ENCOUNTER — Inpatient Hospital Stay (HOSPITAL_COMMUNITY): Payer: Medicare Other | Admitting: Occupational Therapy

## 2019-07-09 DIAGNOSIS — F329 Major depressive disorder, single episode, unspecified: Secondary | ICD-10-CM

## 2019-07-09 LAB — CBC
HCT: 28.4 % — ABNORMAL LOW (ref 39.0–52.0)
Hemoglobin: 8.9 g/dL — ABNORMAL LOW (ref 13.0–17.0)
MCH: 29.3 pg (ref 26.0–34.0)
MCHC: 31.3 g/dL (ref 30.0–36.0)
MCV: 93.4 fL (ref 80.0–100.0)
Platelets: 165 10*3/uL (ref 150–400)
RBC: 3.04 MIL/uL — ABNORMAL LOW (ref 4.22–5.81)
RDW: 17.2 % — ABNORMAL HIGH (ref 11.5–15.5)
WBC: 4.1 10*3/uL (ref 4.0–10.5)
nRBC: 0 % (ref 0.0–0.2)

## 2019-07-09 MED ORDER — CHLORHEXIDINE GLUCONATE CLOTH 2 % EX PADS: 6.0000 | MEDICATED_PAD | Freq: Every day | CUTANEOUS | Status: DC

## 2019-07-09 MED ORDER — HYDROCODONE-ACETAMINOPHEN 10-325 MG PO TABS
1.0000 | ORAL_TABLET | ORAL | Status: DC | PRN
  Administered 2019-07-09 – 2019-07-23 (×27): 1 via ORAL
  Filled 2019-07-09 (×27): qty 1

## 2019-07-09 MED ORDER — CITALOPRAM HYDROBROMIDE 10 MG PO TABS
10.0000 mg | ORAL_TABLET | Freq: Every day | ORAL | Status: DC
  Administered 2019-07-10 – 2019-07-28 (×19): 10 mg via ORAL
  Filled 2019-07-09 (×20): qty 1

## 2019-07-09 MED ORDER — LIDOCAINE HCL URETHRAL/MUCOSAL 2 % EX GEL
1.0000 "application " | Freq: Every day | CUTANEOUS | Status: DC | PRN
  Administered 2019-07-09 – 2019-07-20 (×3): 1 via TOPICAL
  Filled 2019-07-09 (×4): qty 5

## 2019-07-09 MED ORDER — HYDROCODONE-ACETAMINOPHEN 7.5-325 MG PO TABS
1.0000 | ORAL_TABLET | ORAL | Status: DC | PRN
  Administered 2019-07-09 – 2019-07-20 (×14): 1 via ORAL
  Filled 2019-07-09 (×14): qty 1

## 2019-07-09 MED FILL — Sucralfate Susp 1 GM/10ML: ORAL | Qty: 20 | Status: AC

## 2019-07-09 MED FILL — Water For Irrigation, Sterile Irrigation Soln: Qty: 40 | Status: AC
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
## 2019-07-09 NOTE — Progress Notes (Signed)
Patient ID: Frank Fuller, male   DOB: 07-07-1961, 58 y.o.   MRN: [id number]  [facility] Progress Note   Assessment/ Plan:   1. Spinal cord infarct/ paraperesis: per spinal MRI 8/23at T9- T11, following presentation with inability to move his legs. Neuro, NSurg and VVS consulted, all felt etiology was likely spinal artery injury related to aortic dissection procedure.  All recommended supportive care and hope for recovery. Per NSurg, no indication for surgery. Continue treatment with aspirin recommended.  Now admitted to CIR for ongoing physical therapy/Occupational Therapy along with psychotherapy and pain management. 2. ESRD: Continue HD on TTS schedule - no heparin. HD Thursday 3. RecentType B aortic dissection (s/p stent graft repair 8/14): Goal SBP per VVS 120-170. Known severe resistant HTN. Continue to avoid intradialytic hypotension. On ASA 4. HTN/volume: Blood pressures remain elevated on amlodipine 10 mg daily, hydralazine 100 mg 3 times a day, labetalol 300 mg twice a day. 3kg under dry wt. Heart rate occasionally in the 60s and 70s. We started him on spironolactone 25 mg daily. BP's still up. Will resume losartan 100 qd he was taking in August. 5. L4-L5 lumbar discitis: s/p recent aortic stentgraft. Plans noted for intravenous vancomycin and cefepime until 07/29/2019.  Earlier blood cultures negative. 6. Hematochezia- recurrent:GI consulted.Flex sig 9/1 showedstigmata of recent bleedinganddiffusely ulcerated rectum.EGD with fewnon-bleeding erosions in gastric antrum. Rx'd w/ miralax, carafate < 2 weeks. Repeat Flex sig 9/4 withhemostatic spray to entire rectum.Rx'd Tetracycline + Flagyl for possible H.pylori 7. Anemia(ESRD + ABLA):  On maximum dose Aranesp and transfusions when hemoglobin <7 g/deciliter. 8. Secondary hyperparathyroidism -Ca/phos in goal. Continue calcitriol. Parsabiv not on hospital formulary.Binders changed to Fosrenol d/t concern that  Auryxia irritating his bowels 9.   Nutrition: Significant hypoalbuminemia noted likely following acute hospitalization/surgery.  Continue renal diet with ONS. 10. Recent MSSA bacteremia/AVG infection: S/p AVG removal June/ July andAncefcoursecompleted. 11. Depression - following recent sequence of clinical events.  Will reevaluate utility of antidepressant therapy.  Subjective:   Main c/o is frequent stools, unable to control his BM's.    Objective:   BP (!) 186/105 (BP Location: Left Leg)   Pulse 85   Temp 98.3 F (36.8 C) (Oral)   Resp 18   Wt 84.7 kg   SpO2 99%   BMI 22.73 kg/m    Home meds:  - amlodipine 10 hs/ hydralazine 100 tid/ labetalol 300 bid  - atorvastatin 20/ sl ntg prn/ aspirin 81   - pantoprazole 40 bid/ lanthanum 1 gm tid ac  - tetracycline 500 qid/ metronidazole 250 qid  [facility] TTS  4h 53min   400/1.5  87.5kg  2/2.25  P2   R TDC   Hep none - Parsabiv 2.5mg  IV q HD  - Calcitriol 1.83mcg PO q HD   Physical Exam: Gen: Comfortably resting in bed, withdrawn CVS: Pulse regular rhythm, normal rate, S1 and S2 normal Resp: Anteriorly clear to auscultation, no rales.  Right IJ TDC in situ Abd: Soft, flat, nontender Ext: No lower extremity edema Neuro: poor strength in bilat LE's  Labs: BMET Recent Labs  Lab 07/03/19 0814 07/04/19 0756 07/05/19 0929  NA 131* 135 135  K 4.4 3.9 4.4  CL 93* 98 97*  CO2 22 25 22   GLUCOSE 88 90 112*  BUN 45* 22* 38*  CREATININE 10.17* 6.75* 9.48*  CALCIUM 8.8* 8.9 9.1  PHOS 6.7* 5.4* 6.0*   CBC Recent Labs  Lab 07/04/19 0203  07/06/19 0516 07/07/19 0641 07/08/19 0509 07/09/19  0640  WBC 4.8   < > 4.7 4.4 4.4 4.1  NEUTROABS 3.1  --   --   --   --   --   HGB 8.6*   < > 8.9* 9.4* 9.0* 8.9*  HCT 26.7*   < > 27.6* 29.8* 28.3* 28.4*  MCV 91.8   < > 90.5 92.8 92.5 93.4  PLT 209   < > 170 194 169 165   < > = values in this interval not displayed.   Medications:    . amLODipine  10 mg Oral QHS  . atorvastatin   20 mg Oral q1800  . feeding supplement (NEPRO CARB STEADY)  237 mL Oral BID BM  . hydrALAZINE  100 mg Oral Q8H  . labetalol  300 mg Oral BID  . lanthanum  1,000 mg Oral TID WC  . lidocaine  1 application Topical Once  . metroNIDAZOLE  250 mg Oral TID WC & HS  . multivitamin  1 tablet Oral QHS  . oxyCODONE  10 mg Oral QHS  . pantoprazole (PROTONIX) IV  40 mg Intravenous Q12H  . spironolactone  25 mg Oral Daily  . Sucralfate Enema (Sucralfate 2 g/Sterile Water 60 ml Enema)  2 g Rectal Q12H  . tetracycline  500 mg Oral TID AC & HS  . witch hazel-glycerin   Topical QID   Elmarie Shiley, MD 07/09/2019, 12:39 PM

## 2019-07-09 NOTE — Progress Notes (Signed)
Occupational Therapy Session Note  Patient Details  Name: Frank Fuller MRN: [id number] Date of Birth: 1961/09/30  Today's Date: 07/09/2019 OT Individual Time: [id number] OT Individual Time Calculation (min): 55 min    Short Term Goals: Week 1:  OT Short Term Goal 1 (Week 1): patient will complete functional transfers with SB CGA with good carryover of w/c set up OT Short Term Goal 2 (Week 1): patient will complete LB bathing and dressing with min A in safe position with ADs as needed OT Short Term Goal 3 (Week 1): patient will tolerate OOB for 3 hours with good carryover of weight shifts  Skilled Therapeutic Interventions/Progress Updates:    Pt seen for OT session focusing on ADL re-training and functional mobility. Pt awake in supine upon arrival, agreeable to tx session. Cont with complaints of rectal pain, RN already aware and pt willing to cont without intervention. Pt reports increased numbness in buttock to LEs compared to yesterday, medical team made aware.  ADL re-training: Pants donned bed level with supervision, bridging to pull pants up.   Incontinent BM at bed level, total A +2 for clothing management and hygiene.  Toileting up at [place], total A for toileting while standing in STEDY with steadying assist and heavy reliance on UEs.  Grooming tasks completed from w/c level at sink.  Transfers/Mobility: Bed mobility supervision using hospital bed functions.  Sliding board transfer with close supervision and assist for set-up/stabilization of equipment.  Used STEDY to transfer to [facility], min A to power into standing with STEDY and mod A for controlled descent into sitting.   Pt left sitting  On BSC in support of STEDY at end of session, RN made aware of pt's position.   Therapy Documentation Precautions:  Precautions Precautions: Fall Precaution Comments: paraplegic Restrictions Weight Bearing Restrictions: No   Therapy/Group: Individual Therapy  Casey Maxfield  L 07/09/2019, 7:00 AM

## 2019-07-09 NOTE — Progress Notes (Signed)
[facility] PHYSICAL MEDICINE & REHABILITATION PROGRESS NOTE   Subjective/Complaints:  Pt reports he's in so much pain, even though took Norco 1 hour ago- helping somewhat/slowly, but wants to increase dose, if possible.  Suggested giving him an option for 5-10/325 mg depending on severity of pain- he agreed.    ROS: Patient denies nausea, vomiting, diarrhea, cough, shortness of breath or chest pain,    Objective:   No results found. Recent Labs    07/08/19 0509 07/09/19 0640  WBC 4.4 4.1  HGB 9.0* 8.9*  HCT 28.3* 28.4*  PLT 169 165   No results for input(s): NA, K, CL, CO2, GLUCOSE, BUN, CREATININE, CALCIUM in the last 72 hours.  Intake/Output Summary (Last 24 hours) at 07/09/2019 0852 Last data filed at 07/09/2019 0806 Gross per 24 hour  Intake 360 ml  Output 2000 ml  Net -1640 ml     Physical Exam: Vital Signs Blood pressure (!) 186/105, pulse 85, temperature 98.3 F (36.8 C), temperature source Oral, resp. rate 18, weight 84.7 kg, SpO2 99 %. Constitutional: awake, alert, sitting in dark, lying in bed supine; flat affect appears in pain,  NAD HENT:  Head: Normocephalic and atraumatic.  Eyes:  EOM are normal.  Neck: Normal range of motion. No tracheal deviation present. No thyromegaly present.  Cardiovascular: Normal rate and regular rhythm. E Murmur heard. Respiratory: CTA B/L GI: Soft. NT, ND  Musculoskeletal:        General: No edema.     Comments: Bilateral pes planus deformities.   Neurological: He is alert and oriented to person, place, and time. No cranial nerve deficit.  UE 4-5/5. BLE: 3/5 HF, KE and 3+ to 4/5 ADF/PF. Sensation 1/2 below the umbilicus for LT, pain and proprioception. DTR's trace in all 4's.   Skin: He is not diaphoretic.  Psychiatric: Judgment and thought content normal.; Flat     Assessment/Plan: 1. Functional deficits secondary to T10 Spinal cord infarct which require 3+ hours per day of interdisciplinary therapy in a comprehensive  inpatient rehab setting.  Physiatrist is providing close team supervision and 24 hour management of active medical problems listed below.  Physiatrist and rehab team continue to assess barriers to discharge/monitor patient progress toward functional and medical goals  Care Tool:  Bathing  Bathing activity did not occur: Refused Body parts bathed by patient: Right arm, Left arm, Chest, Abdomen, Front perineal area, Right upper leg, Left upper leg, Right lower leg, Left lower leg, Face   Body parts bathed by helper: Buttocks     Bathing assist Assist Level: Minimal Assistance - Patient > 75%     Upper Body Dressing/Undressing Upper body dressing   What is the patient wearing?: Pull over shirt    Upper body assist Assist Level: Set up assist    Lower Body Dressing/Undressing Lower body dressing      What is the patient wearing?: Pants     Lower body assist Assist for lower body dressing: Supervision/Verbal cueing(bed level)     Toileting Toileting    Toileting assist Assist for toileting: Maximal Assistance - Patient 25 - 49%     Transfers Chair/bed transfer  Transfers assist     Chair/bed transfer assist level: Contact Guard/Touching assist     Locomotion Ambulation   Ambulation assist   Ambulation activity did not occur: Safety/medical concerns          Walk 10 feet activity   Assist  Walk 10 feet activity did not occur: Safety/medical concerns  Walk 50 feet activity   Assist Walk 50 feet with 2 turns activity did not occur: Safety/medical concerns         Walk 150 feet activity   Assist Walk 150 feet activity did not occur: Safety/medical concerns         Walk 10 feet on uneven surface  activity   Assist Walk 10 feet on uneven surfaces activity did not occur: Safety/medical concerns         Wheelchair     Assist Will patient use wheelchair at discharge?: Yes Type of Wheelchair: Manual    Wheelchair assist  level: Supervision/Verbal cueing Max wheelchair distance: 150'    Wheelchair 50 feet with 2 turns activity    Assist        Assist Level: Supervision/Verbal cueing   Wheelchair 150 feet activity     Assist  Wheelchair 150 feet activity did not occur: Safety/medical concerns   Assist Level: Supervision/Verbal cueing   Blood pressure (!) 186/105, pulse 85, temperature 98.3 F (36.8 C), temperature source Oral, resp. rate 18, weight 84.7 kg, SpO2 99 %.  Medical Problem List and Plan: 1.  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  Antithrombotics:  -DVT/anticoagulation: SCDs.  LE vascular study requested             -antiplatelet therapy: Low-dose aspirin 81 mg daily 3. Pain Management: Hydrocodone q 6 h prn, add oxy CR at noc low dose, monitor for nausea                -prn tylenol             -prn robaxin for spasms  9/14- no nausea, however rectal pain still there- added tucks QID and lidocaine QID to see if would help- hard to use nerve pain meds in ESRD pt.  9/15- increase to q4 hours prn  9/16- increased to 5-10/325 mg q4 hours prn for pain depending on severity. 4. Mood: Provide emotional support. Pt somewhat disheartened by multiple medical setbacks.              -antipsychotic agents: N/A 5. Neuropsych: This patient is capable of making decisions on his own behalf. 6. Skin/Wound Care: Routine skin checks 7. Fluids/Electrolytes/Nutrition: Routine in and outs with follow-up chemistries 8.  ID.  Continue vancomycin as well as Maxipime through 07/29/2019 per infectious disease 9.  Rectal ulcers/H. pylori.  Underwent EGD and flexible sigmoidoscopy 06/24/2019.  Follow-up per GI services.  Receiving Carafate enema x2 weeks as well as Flagyl x14 days initiated 06/26/2019 and tetracycline x14 days  - GI following and  have discussed with pt re: continuing enemas  -continue to follow hgbs daily and if any significant bleeding recurs he will need flex sig per Dr. Doyne Keel note today  9/15- denies bleeding; Hb 9.0 from 9.4 yesterday -will recheck in AM. Pharmacy following IV ABX regimen  9/16- Hb is 8.9- so basically stable 10.  Acute on chronic normocytic anemia.  Patient did receive 2 units packed red blood cells 06/23/2019 and 1 unit PRBC 07/01/2019 and remains on Aranesp.  Follow CBC as above. 11.  End-stage renal disease.  Continue hemodialysis per renal services.             -HD after therapies to maximize rehab participation 12.  Thoracic aortic dissection.  Status post endovascular stent placement  06/06/2019.  Follow-up per Dr. Oneida Alar 13.  Neurogenic bowel bladder. I's and O's reviewed.   -He is essentially anuric.    -He is moving bowels, patterns somewhat skewed by GIB noted above. Continue to monitor for pattern 14.  Hypertension as well as history of first-degree AV block.  Norvasc 10 mg nightly, hydralazine 100 mg every 8 hours, labetalol 300 mg twice daily.  Monitor with increased mobility in therapies. Hydralazine and labetolol just restarted monitor   9/14- BP still an issue- not due to AD, due to level of T10, so likely due to underlying HTN- will assist Nephrology.  9/16- BP still an issue 180s/100s- will defer to Nephrology in this case. Vitals:   07/08/19 2139 07/09/19 0610  BP: (!) 172/118 (!) 186/105  Pulse: 85 85  Resp:  18  Temp:  98.3 F (36.8 C)  SpO2:  99%  15.  Hyperlipidemia.  Lipitor 16.  Tobacco abuse.  Counseling 17. Dispo  9/15- doesn't want to go to [place] afterwards.    LOS: 5 days A FACE TO FACE EVALUATION WAS PERFORMED  Kanai Berrios 07/09/2019, 8:52 AM

## 2019-07-09 NOTE — Patient Care Conference (Signed)
Inpatient RehabilitationTeam Conference and Plan of Care Update Date: 07/09/2019   Time: 9:55 AM    Patient Name: Frank Fuller      Medical Record Number: [id number]  Date of Birth: 07-04-1961 Sex: Male         Room/Bed: 4M04C/4M04C-01 Payor Info: Payor: MEDICARE / Plan: MEDICARE PART A AND B / Product Type: *No Product type* /    Admit Date/Time:  07/04/2019  5:39 PM  Primary Diagnosis:  Paraplegia [facility])  Patient Active Problem List   Diagnosis Date Noted  . Reactive depression   . Rectal bleeding 07/04/2019  . Vertebral osteomyelitis ([place]) 07/04/2019  . Spinal cord infarction ([place]) 07/04/2019  . Discitis 07/04/2019  . 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 Paraplegia ([place]) 07/03/2019  . H. pylori infection   . Gastric ulcer   . Rectal ulceration   . Lumbar discitis 06/16/2019  . Ileus ([place]) 06/13/2019  . Proctitis 06/13/2019  . H/O endovascular stent graft for abdominal aortic aneurysm 06/13/2019  . Sepsis ([place]) 06/13/2019  . Sepsis ([place]) 06/13/2019  . Aortic dissection ([place]) 06/03/2019  . Encounter for central line placement   . Hypertensive emergency   . Aortic dissection distal to left subclavian ([place]) 05/26/2019  . Problem with dialysis access ([place]) 04/22/2019  . Anemia 04/22/2019  . MSSA bacteremia 04/15/2019  . Pneumonia 04/14/2019  . Hospital-acquired pneumonia 04/14/2019  . ESRD needing dialysis ([place]) 11/11/2018  . Hemoptysis   . Pulmonary edema 08/02/2017  . Acute kidney injury superimposed on chronic kidney disease ([place]) 07/20/2017  . ESRD on dialysis ([place]) 07/19/2017  . ESRD on hemodialysis ([place]) 07/16/2017  . AAA (abdominal aortic aneurysm) without rupture ([place]) 07/15/2017  . Hypertensive urgency 07/14/2017  . Acute encephalopathy   . Wide-complex tachycardia ([place])   . CVA (cerebral vascular accident) ([place]) 12/13/2016  . Hyponatremia 12/13/2016  . SVT (supraventricular tachycardia) ([place])   . 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 ARF  (acute renal failure) ([place]) 12/10/2016  . Thrombocytopenia ([place]) 12/10/2016  . Elevated troponin 12/10/2016  . Accelerated hypertension 12/10/2016  . Hiccups 12/10/2016  . Elevated bilirubin 12/10/2016  . Nausea with vomiting 12/10/2016  . Uremia 12/10/2016    Expected Discharge Date: Expected Discharge Date: 07/25/19  Team Members Present: Physician leading conference: Dr. Courtney Heys Social Worker Present: Lennart Pall, LCSW Nurse Present: Other (comment)(Michelle Ralene Ok, LPN) PT Present: Excell Seltzer, PT OT Present: Amy Rounds, OT SLP Present: Stormy Fabian, SLP PPS Coordinator present : Gunnar Fusi, SLP     Current Status/Progress Goal Weekly Team Focus  Bowel/Bladder   hemodialysis pt, anuric; incontinent of bowel, LBM: 09/15, Q12hr sucralfate enema for rectal ulcer  continue sucralfate enema  assist with tolietings needs prn   Swallow/Nutrition/ Hydration             ADL's   Min  A sliding board transfers, Set-up UB bathing/dressing, total A toileting, sit>stand in STEDY mod A  Supervision-mod I overall w/c level  ADL re-training, SCI education, functional transfers, neuro re-ed   Mobility   CGA to min A bed mobility, CGA slide board transfers, S w/c mobility, min to mod A sit to stand and short distance gait in // bars  Supervision overall, mod A short distance gait  transfers, LE NMR, standing, gait training   Communication             Safety/Cognition/ Behavioral Observations            Pain   c/o pain to rectum, prn  norco, tylenol and tramadol  pain level <4/10  assess pain level QS and prn   Skin   skin intact  remain free of new skin breakdown/infection  assess skin QS and prn      *See Care Plan and progress notes for long and short-term goals.     Barriers to Discharge  Current Status/Progress Possible Resolutions Date Resolved   Nursing                  PT  Decreased caregiver support;Inaccessible home environment;Lack of/limited family  support;Neurogenic Bowel & Bladder;Incontinence;Home environment access/layout                 OT                  SLP                SW                Discharge Planning/Teaching Needs:  Pt to d/c home with only intermittent assistance of brother.  Teaching need TBD   Team Discussion:  T10 spinal cord infarct d/t osteo = ASIA C SCI; +GIB. ESRD.   Still with loose stools - need to establish a bowel program.  This is limiting his tx sessions and rectal pain also interfering.  S-CGA with sl board tfs; STEDY with min/mod.  W/c level goals.  Pt not realistic about his ability to manage independently and mobility in the community as well.  SW to follow up.  Revisions to Treatment Plan:  NA    Medical Summary Current Status: new T10 paraplegia; neurogenic bowel and bladder- risk for spasticity Weekly Focus/Goal: bowel care and rectal pain  Barriers to Discharge: Behavior;Decreased family/caregiver support;Hemodialysis;Home enviroment access/layout;Neurogenic Bowel & Bladder;Incontinence;Medical stability;Other (comments)  Barriers to Discharge Comments: rectal pain Possible Resolutions to Barriers: trying to get controlled   Continued Need for Acute Rehabilitation Level of Care: The patient requires daily medical management by a physician with specialized training in physical medicine and rehabilitation for the following reasons: Direction of a multidisciplinary physical rehabilitation program to maximize functional independence : Yes Medical management of patient stability for increased activity during participation in an intensive rehabilitation regime.: Yes Analysis of laboratory values and/or radiology reports with any subsequent need for medication adjustment and/or medical intervention. : Yes   I attest that I was present, lead the team conference, and concur with the assessment and plan of the team.   Lennart Pall 07/09/2019, 4:17 PM    Team conference was held via web/  teleconference due to [place] - 19

## 2019-07-09 NOTE — Progress Notes (Signed)
Occupational Therapy Session Note  Patient Details  Name: [name] MRN: [id number] Date of Birth: February 17, 1961  Today's Date: 07/09/2019 OT Individual Time: 1100-1200 OT Individual Time Calculation (min): 60 min    Short Term Goals: Week 1:  OT Short Term Goal 1 (Week 1): patient will complete functional transfers with SB CGA with good carryover of w/c set up OT Short Term Goal 2 (Week 1): patient will complete LB bathing and dressing with min A in safe position with ADs as needed OT Short Term Goal 3 (Week 1): patient will tolerate OOB for 3 hours with good carryover of weight shifts  Skilled Therapeutic Interventions/Progress Updates:    Treatment session with focus on trunk control and dynamic sitting balance.  Pt received upright in recliner agreeable to therapy session.  Completed transfer recliner to w/c with use of Stedy.  Pt able to pull up in to [facility] with mod assist for trunk control.  Slide board transfer to therapy mat with assist for placement of slide board then min assist during transfer.  Engaged in lateral leans in sitting with focus on trunk shortening and lengthening and trunk control. Transitioned to supine, engaged in bridging in supine with pt reporting having been incontinent of bowel.  Transferred back to room and back to bed via Stedy with focus on trunk control in high perch during mobility.  Engaged in rolling to allow for hygiene followed by additional bridging.  Pt reports frustration as he feels that he cannot progress with therapies due to frequent, incontinent BM.  Pt remained in supine in bed with all needs in reach.  Therapy Documentation Precautions:  Precautions Precautions: Fall Precaution Comments: paraplegic Restrictions Weight Bearing Restrictions: No General:   Vital Signs: Therapy Vitals Temp: 98.6 F (37 C) Temp Source: Oral Pulse Rate: 84 BP: (!) 169/90 Patient Position (if appropriate): Lying Oxygen Therapy SpO2: 94 % O2 Device: Room  Air Pain: Pain Assessment Pain Scale: 0-10 Pain Score: 7  Pain Type: Chronic pain Pain Location: Buttocks Pain Descriptors / Indicators: Aching;Burning;Discomfort Pain Frequency: Constant Pain Onset: On-going Patients Stated Pain Goal: 0 Pain Intervention(s): Medication (See eMAR)   Therapy/Group: Individual Therapy  Simonne Come 07/09/2019, 3:13 PM

## 2019-07-09 NOTE — Progress Notes (Signed)
Physical Therapy Session Note  Patient Details  Name: Frank Fuller MRN: [id number] Date of Birth: Apr 08, 1961  Today's Date: 07/09/2019 PT Individual Time: 1415-1430 PT Individual Time Calculation (min): 15 min  PT Missed Time: 60 min Missed Time Reason: nausea  Short Term Goals: Week 1:  PT Short Term Goal 1 (Week 1): Pt will perform supine<>sit with CGA PT Short Term Goal 2 (Week 1): Pt will perform bed<>chair transfers with CGA PT Short Term Goal 3 (Week 1): Pt will initiate sit<>stand transfers and gait training PT Short Term Goal 4 (Week 1): Pt will demonstrate understanding of w/c parts with min cuing and min assist to set-up for bed<>chair transfers  Skilled Therapeutic Interventions/Progress Updates:    Pt received seated in bed, pt reports feeling nauseous this PM. Pt declines any OOB mobility or bed level therex due to nausea. Discussed pt's home setup, family support available, etc. Also discussed pt's ongoing bowel incontinence. Per pt he tried sitting on [facility] prior to earlier therapy session and was able to have continent bowel movement. Discussed continuing to attempt to have BM's prior to getting up with therapy to avoid incontinent episodes if able. Pt left supine in bed with needs in reach. Pt missed 60 min of scheduled therapy session due to nausea.  Therapy Documentation Precautions:  Precautions Precautions: Fall Precaution Comments: paraplegic Restrictions Weight Bearing Restrictions: No    Therapy/Group: Individual Therapy   Excell Seltzer, PT, DPT  07/09/2019, 3:42 PM

## 2019-07-10 ENCOUNTER — Ambulatory Visit: Payer: Medicare Other | Admitting: Vascular Surgery

## 2019-07-10 ENCOUNTER — Inpatient Hospital Stay (HOSPITAL_COMMUNITY): Payer: Medicare Other | Admitting: Occupational Therapy

## 2019-07-10 ENCOUNTER — Inpatient Hospital Stay (HOSPITAL_COMMUNITY): Payer: Medicare Other | Admitting: Physical Therapy

## 2019-07-10 DIAGNOSIS — I10 Essential (primary) hypertension: Secondary | ICD-10-CM

## 2019-07-10 DIAGNOSIS — K250 Acute gastric ulcer with hemorrhage: Secondary | ICD-10-CM

## 2019-07-10 DIAGNOSIS — M4646 Discitis, unspecified, lumbar region: Secondary | ICD-10-CM

## 2019-07-10 DIAGNOSIS — F329 Major depressive disorder, single episode, unspecified: Secondary | ICD-10-CM

## 2019-07-10 DIAGNOSIS — I169 Hypertensive crisis, unspecified: Secondary | ICD-10-CM

## 2019-07-10 DIAGNOSIS — R7881 Bacteremia: Secondary | ICD-10-CM

## 2019-07-10 DIAGNOSIS — D649 Anemia, unspecified: Secondary | ICD-10-CM

## 2019-07-10 LAB — RENAL FUNCTION PANEL
Albumin: 2.2 g/dL — ABNORMAL LOW (ref 3.5–5.0)
Anion gap: 14 (ref 5–15)
BUN: 55 mg/dL — ABNORMAL HIGH (ref 6–20)
CO2: 24 mmol/L (ref 22–32)
Calcium: 9.1 mg/dL (ref 8.9–10.3)
Chloride: 96 mmol/L — ABNORMAL LOW (ref 98–111)
Creatinine, Ser: 9.6 mg/dL — ABNORMAL HIGH (ref 0.61–1.24)
GFR calc Af Amer: 6 mL/min — ABNORMAL LOW (ref >60)
GFR calc non Af Amer: 5 mL/min — ABNORMAL LOW (ref >60)
Glucose, Bld: 100 mg/dL — ABNORMAL HIGH (ref 70–99)
Phosphorus: 6.2 mg/dL — ABNORMAL HIGH (ref 2.5–4.6)
Potassium: 4.5 mmol/L (ref 3.5–5.1)
Sodium: 134 mmol/L — ABNORMAL LOW (ref 135–145)

## 2019-07-10 MED ORDER — VANCOMYCIN HCL IN DEXTROSE 1-5 GM/200ML-% IV SOLN
INTRAVENOUS | Status: AC
  Administered 2019-07-10: 1000 mg via INTRAVENOUS
  Filled 2019-07-10: qty 200

## 2019-07-10 MED ORDER — GABAPENTIN 100 MG PO CAPS
100.0000 mg | ORAL_CAPSULE | Freq: Every day | ORAL | Status: DC
  Administered 2019-07-10: 21:00:00 100 mg via ORAL
  Filled 2019-07-10: qty 1

## 2019-07-10 MED ORDER — CLONIDINE HCL 0.1 MG PO TABS
0.1000 mg | ORAL_TABLET | Freq: Two times a day (BID) | ORAL | Status: DC
  Administered 2019-07-10 – 2019-07-11 (×2): 0.1 mg via ORAL
  Filled 2019-07-10 (×2): qty 1

## 2019-07-10 MED ORDER — HEPARIN SODIUM (PORCINE) 1000 UNIT/ML IJ SOLN
INTRAMUSCULAR | Status: AC
  Filled 2019-07-10: qty 4

## 2019-07-10 MED FILL — Water For Irrigation, Sterile Irrigation Soln: Qty: 40 | Status: AC

## 2019-07-10 MED FILL — Sucralfate Susp 1 GM/10ML: ORAL | Qty: 20 | Status: AC

## 2019-07-10 NOTE — Progress Notes (Signed)
Physical Therapy Session Note  Patient Details  Name: Frank Fuller MRN: [id number] Date of Birth: Sep 03, 1961  Today's Date: 07/10/2019 PT Individual Time: 0900-1000 PT Individual Time Calculation (min): 60 min   Short Term Goals: Week 1:  PT Short Term Goal 1 (Week 1): Pt will perform supine<>sit with CGA PT Short Term Goal 2 (Week 1): Pt will perform bed<>chair transfers with CGA PT Short Term Goal 3 (Week 1): Pt will initiate sit<>stand transfers and gait training PT Short Term Goal 4 (Week 1): Pt will demonstrate understanding of w/c parts with min cuing and min assist to set-up for bed<>chair transfers  Skilled Therapeutic Interventions/Progress Updates:    Pt received seated in w/c in room, agreeable to PT session. Pt reports pain in back at baseline, not rated and declines intervention. Manual w/c propulsion x 100 ft with use of BUE before pt reports feeling fatigued. Switched pt to more lightweight w/c with more narrow frame for improved fit and decreased energy usage required for propulsion. Pt is able to propel new w/c x 150 ft with use of BUE and Supervision. Slide board transfer to Nustep with CGA. Nustep level 4 x 5 min with use of B UE/LE and x 5 min level 2 with use of BLE only for strengthening. Pt requires some manual assist to prevent L hip IR while on Nustep, some manual assist for hip and knee extension. Assisted pt with switching laundry from washer to dryer. Sit to stand in // bars with mod A, R knee blocked. Pt is unsteady in standing with heavy reliance on BUE. Pt declines to perform any further standing or gait training due to fear of bowel incontinence with standing activity. Pt is able to set up w/c for slide board transfer to/from mat table with min cueing for safety. Pt notices he has had bowel incontinence while performing slide board transfer, likely happened when he was standing. Slide board transfer back to bed CGA. Sit to supine Supervision. Pt is max A for brief  change and pericare. Prior to bowel accident pt was feeling encouraged and in good spirits with regards to his progress, becomes emotional and tearful after accident. Provided emotional support to patient. Pt left supine in bed with needs in reach, bed alarm in place at end of session.  Therapy Documentation Precautions:  Precautions Precautions: Fall Precaution Comments: paraplegic Restrictions Weight Bearing Restrictions: No    Therapy/Group: Individual Therapy   Excell Seltzer, PT, DPT  07/10/2019, 3:12 PM

## 2019-07-10 NOTE — Progress Notes (Signed)
Occupational Therapy Session Note  Patient Details  Name: Frank Fuller MRN: [id number] Date of Birth: 11/21/1960  Today's Date: 07/10/2019 OT Individual Time: [id number] OT Individual Time Calculation (min): 60 min    Short Term Goals: Week 1:  OT Short Term Goal 1 (Week 1): patient will complete functional transfers with SB CGA with good carryover of w/c set up OT Short Term Goal 2 (Week 1): patient will complete LB bathing and dressing with min A in safe position with ADs as needed OT Short Term Goal 3 (Week 1): patient will tolerate OOB for 3 hours with good carryover of weight shifts  Skilled Therapeutic Interventions/Progress Updates:    Pt seen for OT session focusing on functional mobility and ADL /IADL re-training. Pt awake in supine upon arrival, agreeable to tx session and denying pain. Reports having received enema ~45 minutes ago as part of bowel program, agreeable to getting to [facility].  He transferred to sitting EOB with supervision and heavy reliance on bed rails. Used STEDY to transfer to [facility], min A to power into standing and mod A for controlled descent onto [facility]. Small continent BM, total A for hygiene while standing in STEDY.  He transitioned back to EOB and threaded pants while seated EOB with guarding assist and assist for clothing management. He returned to supine and rolled to pull pants up.  Completed supervision sliding board transfer to w/c with assist for set-up. Grooming tasks completed mod I from w/c level at sink. He gathered Air cabin crew items from low drawer with supervision. Self-propelled w/c throughout unit to pt laundry facility. Completed laundry task from w/c level with assist for set-up.  Discussed at length d/c planning with plan for w/c level at d/c. PT shocked at this recommendation, stating he was planning to "walk out of here". Education provided regarding continuum of care and activity pogression, pt voiced understanding.  Pt returned to room at end of  session, left seated in w/c with all needs in reach and set-up with breakfast tray.   Therapy Documentation Precautions:  Precautions Precautions: Fall Precaution Comments: paraplegic Restrictions Weight Bearing Restrictions: No   Therapy/Group: Individual Therapy  [name] L 07/10/2019, 6:51 AM

## 2019-07-10 NOTE — Progress Notes (Signed)
[facility] PHYSICAL MEDICINE & REHABILITATION PROGRESS NOTE   Subjective/Complaints: Patient seen sitting up in his chair working with therapy this morning.  He states he did not sleep well overnight due to rectal pain.  When probed regarding medications, patient appears to indicate that medications are helpful, but still states that he did not sleep at all overnight.  ROS: Denies CP, SOB, N/V/D  Objective:   No results found. Recent Labs    07/08/19 0509 07/09/19 0640  WBC 4.4 4.1  HGB 9.0* 8.9*  HCT 28.3* 28.4*  PLT 169 165   No results for input(s): NA, K, CL, CO2, GLUCOSE, BUN, CREATININE, CALCIUM in the last 72 hours.  Intake/Output Summary (Last 24 hours) at 07/10/2019 0855 Last data filed at 07/09/2019 1810 Gross per 24 hour  Intake 477 ml  Output -  Net 477 ml     Physical Exam: Vital Signs Blood pressure (!) 188/116, pulse 84, temperature 99.2 F (37.3 C), temperature source Oral, resp. rate 18, weight 84.7 kg, SpO2 100 %. Constitutional: No distress . Vital signs reviewed. HENT: Normocephalic.  Atraumatic. Eyes: EOMI. No discharge. Cardiovascular: No JVD. Respiratory: Normal effort.  No stridor. GI: Non-distended. Skin: Warm and dry.  Intact. Psych: Agitated Musc: No edema in extremities.  No tenderness in extremities. Neurological: Alert Motor: Bilateral upper extremities: 5/5 proximal distal Right lower extremity: Hip flexion, knee extension 2/5, ankle dorsiflexion 4/5 Left lower extremity: Hip flexion 2-/5, knee extension 2+/5, ankle dorsiflexion 4/5 Sensation diminished to light touch below ~T4    Assessment/Plan: 1. Functional deficits secondary to T10 Spinal cord infarct which require 3+ hours per day of interdisciplinary therapy in a comprehensive inpatient rehab setting.  Physiatrist is providing close team supervision and 24 hour management of active medical problems listed below.  Physiatrist and rehab team continue to assess barriers to  discharge/monitor patient progress toward functional and medical goals  Care Tool:  Bathing  Bathing activity did not occur: Refused Body parts bathed by patient: Right arm, Left arm, Chest, Abdomen, Front perineal area, Right upper leg, Left upper leg, Right lower leg, Left lower leg, Face   Body parts bathed by helper: Buttocks     Bathing assist Assist Level: Minimal Assistance - Patient > 75%     Upper Body Dressing/Undressing Upper body dressing   What is the patient wearing?: Pull over shirt    Upper body assist Assist Level: Set up assist    Lower Body Dressing/Undressing Lower body dressing      What is the patient wearing?: Pants     Lower body assist Assist for lower body dressing: Supervision/Verbal cueing(bed level)     Toileting Toileting    Toileting assist Assist for toileting: Maximal Assistance - Patient 25 - 49%     Transfers Chair/bed transfer  Transfers assist     Chair/bed transfer assist level: Maximal Assistance - Patient 25 - 49%     Locomotion Ambulation   Ambulation assist   Ambulation activity did not occur: Safety/medical concerns          Walk 10 feet activity   Assist  Walk 10 feet activity did not occur: Safety/medical concerns        Walk 50 feet activity   Assist Walk 50 feet with 2 turns activity did not occur: Safety/medical concerns         Walk 150 feet activity   Assist Walk 150 feet activity did not occur: Safety/medical concerns  Walk 10 feet on uneven surface  activity   Assist Walk 10 feet on uneven surfaces activity did not occur: Safety/medical concerns         Wheelchair     Assist Will patient use wheelchair at discharge?: Yes Type of Wheelchair: Manual    Wheelchair assist level: Supervision/Verbal cueing Max wheelchair distance: 150'    Wheelchair 50 feet with 2 turns activity    Assist        Assist Level: Supervision/Verbal cueing   Wheelchair  150 feet activity     Assist  Wheelchair 150 feet activity did not occur: Safety/medical concerns   Assist Level: Supervision/Verbal cueing   Blood pressure (!) 188/116, pulse 84, temperature 99.2 F (37.3 C), temperature source Oral, resp. rate 18, weight 84.7 kg, SpO2 100 %.  Medical Problem List and Plan: 1.  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   Continue CIR 2.  Antithrombotics:  -DVT/anticoagulation: SCDs.  LE vascular study limited, but negative for DVT.             -antiplatelet therapy: Low-dose aspirin 81 mg daily 3. Pain Management:   Hydrocodone q 6 h prn increase to every 4 hours as needed, increased to 5-10/325 every 4 hours as needed on 9/16  Added oxy CR at noc low dose, monitor for nausea              -prn tylenol             -prn robaxin for spasms  Added tucks QID and lidocaine QID  Gabapentin 100 nightly started on 9/17 4. Mood: Provide emotional support. Pt somewhat disheartened by multiple medical setbacks.              -antipsychotic agents: N/A 5. Neuropsych: This patient is capable of making decisions on his own behalf. 6. Skin/Wound Care: Routine skin checks 7. Fluids/Electrolytes/Nutrition: Routine in and outs 8. ID: Continue vancomycin as well as Maxipime through 07/29/2019 per infectious disease 9. Rectal ulcers/H. pylori.  Underwent EGD and flexible sigmoidoscopy 06/24/2019.  Follow-up per GI services.  Receiving Carafate enema x2 weeks as well as Flagyl x14 days initiated 06/26/2019 and tetracycline x14 days, completed course on 9/17  - GI following and have discussed with pt re: continuing enemas  -continue to follow hgbs daily and if any significant bleeding recurs he will need flex sig per Dr. Doyne Keel note  Hemoglobin 8.9 on 9/16  Continue to monitor 10.  Acute on chronic normocytic anemia.  Patient did receive 2 units packed red blood  cells 06/23/2019 and 1 unit PRBC 07/01/2019 and remains on Aranesp.    See #9. 11.  End-stage renal disease.  Continue hemodialysis per renal services.             -HD after therapies to maximize rehab participation 12.  Thoracic aortic dissection.  Status post endovascular stent placement 06/06/2019.  Follow-up per Dr. Oneida Alar 13.  Neurogenic bowel bladder. I's and O's reviewed.   -He is essentially anuric.    -He is moving bowels, patterns somewhat skewed by GIB noted above. Continue to monitor for pattern 14.  Hypertension as well as history of first-degree AV block.  Norvasc 10 mg nightly, hydralazine 100 mg every 8 hours, labetalol 300 mg twice daily.  Monitor with increased mobility in therapies.   Remains extremely elevated on 9/17 with hypertensive crisis  Clonidine started on 9/17, especially given #12, started on low-dose-monitor for  dizziness Vitals:   07/09/19 2142 07/10/19 0609  BP: (!) 175/101 (!) 188/116  Pulse:  84  Resp:  18  Temp:  99.2 F (37.3 C)  SpO2:  100%  15.  Hyperlipidemia.  Lipitor 16.  Tobacco abuse.  Counseling    LOS: 6 days A FACE TO FACE EVALUATION WAS PERFORMED  Ankit Lorie Phenix 07/10/2019, 8:55 AM

## 2019-07-10 NOTE — Procedures (Signed)
   I was present at this dialysis session, have reviewed the session itself and made  appropriate changes [name] MD [facility] pager [phone]   07/10/2019, 2:50 PM

## 2019-07-10 NOTE — Progress Notes (Signed)
Occupational Therapy Note  Patient Details  Name: Frank Fuller MRN: [id number] Date of Birth: 1961/08/15  Today's Date: 07/10/2019 OT Missed Time: 27 Minutes Missed Time Reason: Patient fatigue  Pt missed 60 mins therapy session due to refusal secondary to fatigue.  Pt asleep upon arrival, requiring increased time to arouse.  Pt reporting extreme fatigue and requesting to continue to rest.  Due to HD schedule, will follow up tomorrow.  Simonne Come 07/10/2019, 12:57 PM

## 2019-07-10 NOTE — Progress Notes (Signed)
Have reviewed team conference information with pt and brother, Heron Sabins.  Both aware and agreeable with targeted d/c date of 10/2.  Aware of w/c level goals supervision - mod ind and hope for short distance gait.  Discussed need for ramp.  Both state that they have no resources to build this and thought [facility] would do this for them.  Brother asked that I contact the [facility] where pt is followed and discuss pt's needs for home.  Pt now in HD but will follow up with him in the morning.    Fate Caster, LCSW

## 2019-07-11 ENCOUNTER — Inpatient Hospital Stay (HOSPITAL_COMMUNITY): Payer: Medicare Other | Admitting: Physical Therapy

## 2019-07-11 ENCOUNTER — Inpatient Hospital Stay (HOSPITAL_COMMUNITY): Payer: Medicare Other | Admitting: Occupational Therapy

## 2019-07-11 DIAGNOSIS — I160 Hypertensive urgency: Secondary | ICD-10-CM

## 2019-07-11 LAB — CBC
HCT: 32 % — ABNORMAL LOW (ref 39.0–52.0)
Hemoglobin: 10.4 g/dL — ABNORMAL LOW (ref 13.0–17.0)
MCH: 30.3 pg (ref 26.0–34.0)
MCHC: 32.5 g/dL (ref 30.0–36.0)
MCV: 93.3 fL (ref 80.0–100.0)
Platelets: 128 10*3/uL — ABNORMAL LOW (ref 150–400)
RBC: 3.43 MIL/uL — ABNORMAL LOW (ref 4.22–5.81)
RDW: 17.4 % — ABNORMAL HIGH (ref 11.5–15.5)
WBC: 4.5 10*3/uL (ref 4.0–10.5)
nRBC: 0 % (ref 0.0–0.2)

## 2019-07-11 MED ORDER — LOSARTAN POTASSIUM 50 MG PO TABS
100.0000 mg | ORAL_TABLET | Freq: Every day | ORAL | Status: DC
  Administered 2019-07-11 – 2019-07-28 (×13): 100 mg via ORAL
  Filled 2019-07-11 (×15): qty 2

## 2019-07-11 MED ORDER — PHENOL 1.4 % MT LIQD
1.0000 | OROMUCOSAL | Status: DC | PRN
  Administered 2019-07-12 – 2019-07-16 (×6): 1 via OROMUCOSAL
  Filled 2019-07-11 (×2): qty 177

## 2019-07-11 MED ORDER — DARBEPOETIN ALFA 150 MCG/0.3ML IJ SOSY
150.0000 ug | PREFILLED_SYRINGE | INTRAMUSCULAR | Status: DC
  Administered 2019-07-12 – 2019-07-26 (×3): 150 ug via INTRAVENOUS
  Filled 2019-07-11 (×3): qty 0.3

## 2019-07-11 MED ORDER — GABAPENTIN 100 MG PO CAPS
200.0000 mg | ORAL_CAPSULE | Freq: Every day | ORAL | Status: DC
  Administered 2019-07-11 – 2019-07-20 (×10): 200 mg via ORAL
  Filled 2019-07-11 (×10): qty 2

## 2019-07-11 MED ORDER — PANTOPRAZOLE SODIUM 40 MG PO TBEC
40.0000 mg | DELAYED_RELEASE_TABLET | Freq: Two times a day (BID) | ORAL | Status: DC
  Administered 2019-07-12 – 2019-07-28 (×33): 40 mg via ORAL
  Filled 2019-07-11 (×34): qty 1

## 2019-07-11 MED ORDER — CHLORHEXIDINE GLUCONATE CLOTH 2 % EX PADS
6.0000 | MEDICATED_PAD | Freq: Every day | CUTANEOUS | Status: DC
  Administered 2019-07-11 – 2019-07-14 (×4): 6 via TOPICAL

## 2019-07-11 MED FILL — Sucralfate Susp 1 GM/10ML: ORAL | Qty: 20 | Status: AC

## 2019-07-11 MED FILL — Water For Irrigation, Sterile Irrigation Soln: Qty: 40 | Status: AC

## 2019-07-11 NOTE — Progress Notes (Signed)
Occupational Therapy Session Note  Patient Details  Name: Frank Fuller MRN: [id number] Date of Birth: April 18, 1961  Today's Date: 07/11/2019 OT Individual Time: 1000-1100 OT Individual Time Calculation (min): 60 min    Short Term Goals: Week 2:  OT Short Term Goal 1 (Week 2): Pt will complete 3/3 toileting tasks on BSC with mod A without use of mechanical lift in prep for d/c home at w/c level OT Short Term Goal 2 (Week 2): Pt will set-up all equipment needed for sliding board transfer with supervision/VC only in prep for transfers at mod I level OT Short Term Goal 3 (Week 2): Pt will don socks/shoes with supervision using AE PRN  Skilled Therapeutic Interventions/Progress Updates:    Treatment session with focus on trunk control, BLE strengthening, and functional transfers.  Pt received supine in bed reporting fatigue but agreeable to therapy session.  Discussed bowel program and recommendation to attempt toileting during session to increase success with continent BM.  Pt completed squat pivot transfer bed > drop arm BSC with CGA.  Pt pushed up through BUE from [facility] to allow therapist to remove pants and incontinence brief.  Pt continent of BM on BSC, reporting sensation when evacuating bowels.  Pt again able to push up through BUE to allow for hygiene.  Pt pleased with continent BM.  Pt requested to return to bed for LB clothing management.  Completed squat pivot back to bed CGA and pt able to don incontinence brief and pants while bridging.  Engaged in bridging, BLE slides in bed, and gluteal squeezes in supine to continue to address mobility and core strengthening as needed for self-care tasks.  Pt reports fatigue and remained semi-reclined in bed with all needs in reach.  Therapy Documentation Precautions:  Precautions Precautions: Fall Precaution Comments: paraplegic Restrictions Weight Bearing Restrictions: No General:   Vital Signs: Therapy Vitals Pulse Rate: 83 Resp: 18 BP: (!)  177/98 Pain: Pain Assessment Pain Scale: 0-10 Pain Score: 5  Pain Type: Acute pain Pain Location: Other (Comment)(all over) Pain Descriptors / Indicators: Aching;Burning Pain Intervention(s): Medication (See eMAR)   Therapy/Group: Individual Therapy  Simonne Come 07/11/2019, 12:31 PM

## 2019-07-11 NOTE — Progress Notes (Signed)
[facility] PHYSICAL MEDICINE & REHABILITATION PROGRESS NOTE   Subjective/Complaints: Patient seen sitting up in bed working with therapy this morning.  He states he slept well overnight.  He notes some improvement in pain with change in medications.  ROS: Denies CP, SOB, N/V/D  Objective:   No results found. Recent Labs    07/09/19 0640  WBC 4.1  HGB 8.9*  HCT 28.4*  PLT 165   Recent Labs    07/10/19 1422  NA 134*  K 4.5  CL 96*  CO2 24  GLUCOSE 100*  BUN 55*  CREATININE 9.60*  CALCIUM 9.1    Intake/Output Summary (Last 24 hours) at 07/11/2019 0855 Last data filed at 07/11/2019 0654 Gross per 24 hour  Intake 300 ml  Output 2000 ml  Net -1700 ml     Physical Exam: Vital Signs Blood pressure (!) 186/106, pulse 84, temperature 99.1 F (37.3 C), temperature source Oral, resp. rate 18, weight 83.9 kg, SpO2 98 %. Constitutional: No distress . Vital signs reviewed. HENT: Normocephalic.  Atraumatic. Eyes: EOMI. No discharge. Cardiovascular: No JVD. Respiratory: Normal effort.  No stridor. GI: Non-distended. Skin: Warm and dry.  Intact. Psych: Normal mood.  Normal behavior. Musc: No edema in extremities.  No tenderness in extremities. Neurological: Alert Motor: Bilateral upper extremities: 5/5 proximal distal Right lower extremity: Hip flexion, knee extension 2/5, ankle dorsiflexion 4/5, unchanged Left lower extremity: Hip flexion 2-/5, knee extension 2+/5, ankle dorsiflexion 4/5, unchanged  Assessment/Plan: 1. Functional deficits secondary to T10 Spinal cord infarct which require 3+ hours per day of interdisciplinary therapy in a comprehensive inpatient rehab setting.  Physiatrist is providing close team supervision and 24 hour management of active medical problems listed below.  Physiatrist and rehab team continue to assess barriers to discharge/monitor patient progress toward functional and medical goals  Care Tool:  Bathing  Bathing activity did not  occur: Refused Body parts bathed by patient: Right arm, Left arm, Chest, Abdomen, Front perineal area, Right upper leg, Left upper leg, Right lower leg, Left lower leg, Face   Body parts bathed by helper: Buttocks     Bathing assist Assist Level: Minimal Assistance - Patient > 75%     Upper Body Dressing/Undressing Upper body dressing   What is the patient wearing?: Pull over shirt    Upper body assist Assist Level: Set up assist    Lower Body Dressing/Undressing Lower body dressing      What is the patient wearing?: Pants     Lower body assist Assist for lower body dressing: Supervision/Verbal cueing(bed level)     Toileting Toileting    Toileting assist Assist for toileting: Total Assistance - Patient < 25%(Standing in STEDY)     Transfers Chair/bed transfer  Transfers assist     Chair/bed transfer assist level: Contact Guard/Touching assist     Locomotion Ambulation   Ambulation assist   Ambulation activity did not occur: Safety/medical concerns          Walk 10 feet activity   Assist  Walk 10 feet activity did not occur: Safety/medical concerns        Walk 50 feet activity   Assist Walk 50 feet with 2 turns activity did not occur: Safety/medical concerns         Walk 150 feet activity   Assist Walk 150 feet activity did not occur: Safety/medical concerns         Walk 10 feet on uneven surface  activity   Assist Walk 10 feet  on uneven surfaces activity did not occur: Safety/medical concerns         Wheelchair     Assist Will patient use wheelchair at discharge?: Yes Type of Wheelchair: Manual    Wheelchair assist level: Supervision/Verbal cueing Max wheelchair distance: 150'    Wheelchair 50 feet with 2 turns activity    Assist        Assist Level: Supervision/Verbal cueing   Wheelchair 150 feet activity     Assist  Wheelchair 150 feet activity did not occur: Safety/medical concerns   Assist  Level: Supervision/Verbal cueing   Blood pressure (!) 186/106, pulse 84, temperature 99.1 F (37.3 C), temperature source Oral, resp. rate 18, weight 83.9 kg, SpO2 98 %.  Medical Problem List and Plan: 1.  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   Cont CIR 2.  Antithrombotics:  -DVT/anticoagulation: SCDs.  LE vascular study limited, but negative for DVT.             -antiplatelet therapy: Low-dose aspirin 81 mg daily 3. Pain Management:   Hydrocodone q 6 h prn increase to every 4 hours as needed, increased to 5-10/325 every 4 hours as needed on 9/16  Added oxy CR at noc low dose, monitor for nausea              -prn tylenol             -prn robaxin for spasms  Added tucks QID and lidocaine QID  Gabapentin 100 nightly started on 9/17, increased to 200 on 9/18 4. Mood: Provide emotional support. Pt somewhat disheartened by multiple medical setbacks.              -antipsychotic agents: N/A 5. Neuropsych: This patient is capable of making decisions on his own behalf. 6. Skin/Wound Care: Routine skin checks 7. Fluids/Electrolytes/Nutrition: Routine in and outs 8. ID: Continue vancomycin as well as Maxipime through 07/29/2019 per infectious disease  Random level WNL on 9/16 9. Rectal ulcers/H. pylori.  Underwent EGD and flexible sigmoidoscopy 06/24/2019.  Follow-up per GI services.  Received Carafate enema x2 weeks as well as Flagyl x14 days initiated 06/26/2019 and tetracycline x14 days, completed course on 9/17.  -GI following and have discussed with pt re: continuing enemas  -continue to follow hgbs and if any significant bleeding recurs he will need flex sig per Dr. Doyne Keel note  Hemoglobin 8.9 on 9/16, labs pending  Continue to monitor 10.  Acute on chronic normocytic anemia.  Patient did receive 2 units packed red blood cells 06/23/2019 and 1 unit PRBC 07/01/2019 and remains on  Aranesp.    See #9. 11.  End-stage renal disease.  Continue hemodialysis per renal services.             -HD after therapies to maximize rehab participation 12.  Thoracic aortic dissection.  Status post endovascular stent placement 06/06/2019.  Follow-up per Dr. Oneida Alar 13.  Neurogenic bowel bladder. I's and O's reviewed.   -He is essentially anuric.    -He is moving bowels, patterns somewhat skewed by GIB noted above. Continue to monitor for pattern 14.  Hypertension as well as history of first-degree AV block.  Norvasc 10 mg nightly, hydralazine 100 mg every 8 hours, labetalol 300 mg twice daily.  Monitor with increased mobility in therapies.   Spironolactone 25 daily started on 9/13  Losartan 100 daily started on 9/18 per Nephro recs  Remains extremely elevated on 9/18 with  hypertensive urgency Vitals:   07/10/19 2112 07/11/19 0508  BP: (!) 183/94 (!) 186/106  Pulse:  84  Resp:  18  Temp:  99.1 F (37.3 C)  SpO2:  98%  15.  Hyperlipidemia.  Lipitor 16.  Tobacco abuse.  Counseling    LOS: 7 days A FACE TO FACE EVALUATION WAS PERFORMED  Brooks Kinnan Lorie Phenix 07/11/2019, 8:55 AM

## 2019-07-11 NOTE — Care Management (Signed)
[facility] Individual Statement of Services  Patient Name:  Frank Fuller  Date:  07/08/2019  Welcome to the [facility].  Our goal is to provide you with an individualized program based on your diagnosis and situation, designed to meet your specific needs.  With this comprehensive rehabilitation program, you will be expected to participate in at least 3 hours of rehabilitation therapies Monday-Friday, with modified therapy programming on the weekends.  Your rehabilitation program will include the following services:  Physical Therapy (PT), Occupational Therapy (OT), 24 hour per day rehabilitation nursing, Therapeutic Recreaction (TR), Neuropsychology, Case Management (Social Worker), Rehabilitation Medicine, Nutrition Services and Pharmacy Services  Weekly team conferences will be held on Wednesdays to discuss your progress.  Your Social Worker will talk with you frequently to get your input and to update you on team discussions.  Team conferences with you and your family in attendance may also be held.  Expected length of stay: 18-21 days   Overall anticipated outcome: supervision to moderate assistance  Depending on your progress and recovery, your program may change. Your Social Worker will coordinate services and will keep you informed of any changes. Your Social Worker's name and contact numbers are listed  below.  The following services may also be recommended but are not provided by the [facility] will be made to provide these services after discharge if needed.  Arrangements include referral to agencies that provide these services.  Your insurance has been verified to be:  Medicare; Medicaid Your primary doctor is:  [facility]  Pertinent information will be shared with your doctor and your insurance  company.  Social Worker:  [place], [place] or ([phone]   Information discussed with and copy given to patient by: Lennart Pall, 07/08/2019, 3:35 PM

## 2019-07-11 NOTE — Progress Notes (Signed)
Occupational Therapy Weekly Progress Note  Patient Details  Name: Frank Fuller MRN: [id number] Date of Birth: [birth year]  Beginning of progress report period: July 05, 2019 End of progress report period: July 11, 2019  Today's Date: 07/11/2019 OT Missed Time: 60 Minutes Missed Time Reason: Patient fatigue   Patient has met 1 of 3 short term goals.  Pt is making slow but steady progress towards OT goals. He cont to be most limited by generalized weakness/fatigue and frequent incontinent bowel episodes with mobility. MD is aware of bowel issues and working to establish bowel program with education being provided throughout from OT regarding bowel program and working to get to [facility] as part of program to encourage upright and complete void. Pt is completing sliding board transfers with guarding assist. He is able to complete sit>stand in STEDY with min-mod A and heavy reliance on UEs. However, plan for w/c level at d/c. Pt at times demonstrates poor insight into severity of deficits and requires VCs/education for problem solving ADLs/IADLs at w/c level for d/c.  He is able to complete bathing/dressing routine EOB via lateral leans or in supine, bridging/rolling to pull pants up. Toileting task total A via sit>stand in STEDY or bed level following incontinent episode.   Patient continues to demonstrate the following deficits: muscle weakness and muscle paralysis and decreased sitting balance, decreased standing balance, decreased postural control and decreased balance strategies and therefore will continue to benefit from skilled OT intervention to enhance overall performance with BADL and Reduce care partner burden.  Patient progressing toward long term goals..  Continue plan of care.  OT Short Term Goals Week 1:  OT Short Term Goal 1 (Week 1): patient will complete functional transfers with SB CGA with good carryover of w/c set up OT Short Term Goal 1 - Progress (Week 1): Met OT Short  Term Goal 2 (Week 1): patient will complete LB bathing and dressing with min A in safe position with ADs as needed OT Short Term Goal 2 - Progress (Week 1): Not met OT Short Term Goal 3 (Week 1): patient will tolerate OOB for 3 hours with good carryover of weight shifts OT Short Term Goal 3 - Progress (Week 1): Progressing toward goal Week 2:  OT Short Term Goal 1 (Week 2): Pt will complete 3/3 toileting tasks on BSC with mod A without use of mechanical lift in prep for d/c home at w/c level OT Short Term Goal 2 (Week 2): Pt will set-up all equipment needed for sliding board transfer with supervision/VC only in prep for transfers at mod I level OT Short Term Goal 3 (Week 2): Pt will don socks/shoes with supervision using AE PRN  Skilled Therapeutic Interventions/Progress Updates:    Pt sleeping soundly upon arrival, unable to arouse. He would open eyes and then immediately close back, not responding to multi-modal cuing.   Therapist returned one hour later, pt verbally responding to multi-modal cuing, however, cont to keep eyes closed and unable to maintain arousal.   Pt missed 60 min of skilled tx secondary to fatigue.   Therapy Documentation Precautions:  Precautions Precautions: Fall Precaution Comments: paraplegic Restrictions Weight Bearing Restrictions: No   Therapy/Group: Individual Therapy  Alyxis Grippi L 07/11/2019, 6:52 AM

## 2019-07-11 NOTE — Progress Notes (Signed)
Physical Therapy Session Note  Patient Details  Name: Frank Fuller MRN: [id number] Date of Birth: 08/07/1961  Today's Date: 07/11/2019 PT Individual Time: 0800-0905 PT Individual Time Calculation (min): 65 min   Short Term Goals: Week 1:  PT Short Term Goal 1 (Week 1): Pt will perform supine<>sit with CGA PT Short Term Goal 2 (Week 1): Pt will perform bed<>chair transfers with CGA PT Short Term Goal 3 (Week 1): Pt will initiate sit<>stand transfers and gait training PT Short Term Goal 4 (Week 1): Pt will demonstrate understanding of w/c parts with min cuing and min assist to set-up for bed<>chair transfers  Skilled Therapeutic Interventions/Progress Updates:    Pt received supine in bed, agreeable to PT session. No complaints of pain this AM. Supine to sit with Supervision. Slide board transfer bed to w/c with CGA. Pt is setup A to wash face and brush teeth while seated in w/c at sink. Manual w/c propulsion x 200 ft with use of BUE and Supervision. Provided pt with w/c gloves for improved grip and skin protection with w/c mobility. Reviewed management of w/c parts and safe setup for w/c for transfers to/from mat table. Pt still requires assist with managing w/c leg rests. Squat pivot transfer w/c to/from mat table with CGA without use of slide board. Discussed pt's home setup and provided handout for home measurement sheet for pt to have his brother fill out. Also discussed that pt will be d/c at w/c level and will have a custom w/c evaluation for the wheelchair he will use at home. Pt receptive to education. Sit to/from supine on flat mat table with min A progressing to Supervision with use of LLE to assist with lifting RLE up onto mat table. Supine bridges x 10 reps, B hip abd x 10 reps with AAROM, attempt heel slides but pt has onset of rectal pain. Supine to sit Supervision. Pt attempts to don shoes while seated EOM but becomes lightheaded from leaning forwards towards the floor. Assisted pt with  obtaining long-sitting position then flexing knee to bring his foot closer to his hands so he can don his shoes. Squat pivot transfer back to w/c with CGA. Pt has had incontinence of bowel during session. Squat pivot transfer back to bed CGA. Sit to supine Supervision. Rolling L/R with Supervision for max A pericare and brief change. Pt is max A to don new pair of pants. Pt again tearful and emotional about ongoing incontinence, provided emotional support. Pt left supine in bed with needs in reach at end of session.  Therapy Documentation Precautions:  Precautions Precautions: Fall Precaution Comments: paraplegic Restrictions Weight Bearing Restrictions: No    Therapy/Group: Individual Therapy   Excell Seltzer, PT, DPT  07/11/2019, 9:58 AM

## 2019-07-11 NOTE — Progress Notes (Signed)
Patient ID: Frank Fuller, male   DOB: [birth year], 58 y.o.   MRN: [id number]  [facility] Progress Note   Assessment/ Plan:   1. Spinal cord infarct/ paraperesis: per spinal MRI 8/23at T9- T11, following presentation with inability to move his legs. Neuro, NSurg and VVS consulted, all felt etiology was likely spinal artery injury related to aortic dissection procedure.  All recommended supportive care and hope for recovery. Per NSurg, no indication for surgery. Continue treatment with aspirin recommended.  Now admitted to CIR for ongoing physical therapy/Occupational Therapy along with psychotherapy and pain management. 2. ESRD: Continue HD on TTS schedule - no heparin. HD Sat 3. RecentType B aortic dissection (s/p stent graft repair 8/14): Goal SBP per VVS 120-170. Known severe resistant HTN. Continue to avoid intradialytic hypotension. On ASA 4. HTN/volume: Blood pressures remain up, added losartan back yesterday to baseline amlodipine/ hydral/ labetalol. Aldactone added here. If BP's still uncontrolled next step would try clonidine patch #2 or #3.  5. L4-L5 lumbar discitis: s/p recent aortic stentgraft. Plans noted for intravenous vancomycin and cefepime until 07/29/2019.  Earlier blood cultures negative. 6. Hematochezia- recurrent:GI consulted.Flex sig 9/1 showedstigmata of recent bleedinganddiffusely ulcerated rectum.EGD with fewnon-bleeding erosions in gastric antrum. Rx'd w/ miralax, carafate < 2 weeks. Repeat Flex sig 9/4 withhemostatic spray to entire rectum.Rx'd Tetracycline + Flagyl for possible H.pylori 7. Anemia(ESRD + ABLA):  On maximum dose Aranesp and transfusions when hemoglobin <7 g/deciliter. 8. Secondary hyperparathyroidism -Ca/phos in goal. Continue calcitriol. Parsabiv not on hospital formulary.Binders changed to Fosrenol d/t concern that Auryxia irritating his bowels 9.   Nutrition: Significant hypoalbuminemia noted likely following acute  hospitalization/surgery.  Continue renal diet with ONS. 10. Recent MSSA bacteremia/AVG infection: S/p AVG removal June/ July andAncefcoursecompleted. 11. Depression - following recent sequence of clinical events.  Will reevaluate utility of antidepressant therapy.  Subjective:   Main c/o is frequent stools, unable to control his BM's.    Objective:   BP (!) 177/98   Pulse 83   Temp 99.1 F (37.3 C) (Oral)   Resp 18   Wt 83.9 kg   SpO2 98%   BMI 22.51 kg/m    Home meds:  - amlodipine 10 hs/ hydralazine 100 tid/ labetalol 300 bid  - atorvastatin 20/ sl ntg prn/ aspirin 81   - pantoprazole 40 bid/ lanthanum 1 gm tid ac  - tetracycline 500 qid/ metronidazole 250 qid  [facility] TTS  4h 47min   400/1.5  87.5kg  2/2.25  P2   R TDC   Hep none - Parsabiv 2.5mg  IV q HD  - Calcitriol 1.54mcg PO q HD   Physical Exam: Gen: Comfortably resting in bed, withdrawn CVS: Pulse regular rhythm, normal rate, S1 and S2 normal Resp: Anteriorly clear to auscultation, no rales.  Right IJ TDC in situ Abd: Soft, flat, nontender Ext: No lower extremity edema Neuro: poor strength in bilat LE's  Labs: BMET Recent Labs  Lab 07/05/19 0929 07/10/19 1422  NA 135 134*  K 4.4 4.5  CL 97* 96*  CO2 22 24  GLUCOSE 112* 100*  BUN 38* 55*  CREATININE 9.48* 9.60*  CALCIUM 9.1 9.1  PHOS 6.0* 6.2*   CBC Recent Labs  Lab 07/07/19 0641 07/08/19 0509 07/09/19 0640 07/11/19 0949  WBC 4.4 4.4 4.1 4.5  HGB 9.4* 9.0* 8.9* 10.4*  HCT 29.8* 28.3* 28.4* 32.0*  MCV 92.8 92.5 93.4 93.3  PLT 194 169 165 128*   Medications:    . amLODipine  10 mg Oral  QHS  . atorvastatin  20 mg Oral q1800  . Chlorhexidine Gluconate Cloth  6 each Topical Q0600  . citalopram  10 mg Oral Daily  . [START ON 07/12/2019] darbepoetin (ARANESP) injection - DIALYSIS  150 mcg Intravenous Q Sat-HD  . feeding supplement (NEPRO CARB STEADY)  237 mL Oral BID BM  . gabapentin  200 mg Oral QHS  . hydrALAZINE  100 mg Oral Q8H  .  labetalol  300 mg Oral BID  . lanthanum  1,000 mg Oral TID WC  . lidocaine  1 application Topical Once  . losartan  100 mg Oral Daily  . multivitamin  1 tablet Oral QHS  . oxyCODONE  10 mg Oral QHS  . pantoprazole (PROTONIX) IV  40 mg Intravenous Q12H  . spironolactone  25 mg Oral Daily  . Sucralfate Enema (Sucralfate 2 g/Sterile Water 60 ml Enema)  2 g Rectal Q12H  . witch hazel-glycerin   Topical QID   Elmarie Shiley, MD 07/11/2019, 12:31 PM

## 2019-07-11 NOTE — Progress Notes (Signed)
Social Work Patient ID: Frank Fuller, male   DOB: [birth year], 58 y.o.   MRN: [id number]  Have spoken with SW at [facility] in [place] about Frank Fuller's ramp assistance needs.  Have submitted an "urgent request" form as instructed in hopes that they may be able to assist and have in place a modular ramp by the time of Frank Fuller's d/c.    Moustafa Mossa, LCSW

## 2019-07-12 ENCOUNTER — Inpatient Hospital Stay (HOSPITAL_COMMUNITY): Payer: Medicare Other | Admitting: Physical Therapy

## 2019-07-12 DIAGNOSIS — I150 Renovascular hypertension: Secondary | ICD-10-CM

## 2019-07-12 DIAGNOSIS — D696 Thrombocytopenia, unspecified: Secondary | ICD-10-CM

## 2019-07-12 DIAGNOSIS — M792 Neuralgia and neuritis, unspecified: Secondary | ICD-10-CM

## 2019-07-12 LAB — CBC
HCT: 29.6 % — ABNORMAL LOW (ref 39.0–52.0)
Hemoglobin: 9.1 g/dL — ABNORMAL LOW (ref 13.0–17.0)
MCH: 29.4 pg (ref 26.0–34.0)
MCHC: 30.7 g/dL (ref 30.0–36.0)
MCV: 95.5 fL (ref 80.0–100.0)
Platelets: 141 10*3/uL — ABNORMAL LOW (ref 150–400)
RBC: 3.1 MIL/uL — ABNORMAL LOW (ref 4.22–5.81)
RDW: 17.3 % — ABNORMAL HIGH (ref 11.5–15.5)
WBC: 4.8 10*3/uL (ref 4.0–10.5)
nRBC: 0 % (ref 0.0–0.2)

## 2019-07-12 LAB — RENAL FUNCTION PANEL
Albumin: 2.1 g/dL — ABNORMAL LOW (ref 3.5–5.0)
Anion gap: 13 (ref 5–15)
BUN: 49 mg/dL — ABNORMAL HIGH (ref 6–20)
CO2: 25 mmol/L (ref 22–32)
Calcium: 9.3 mg/dL (ref 8.9–10.3)
Chloride: 97 mmol/L — ABNORMAL LOW (ref 98–111)
Creatinine, Ser: 9.17 mg/dL — ABNORMAL HIGH (ref 0.61–1.24)
GFR calc Af Amer: 6 mL/min — ABNORMAL LOW (ref >60)
GFR calc non Af Amer: 6 mL/min — ABNORMAL LOW (ref >60)
Glucose, Bld: 132 mg/dL — ABNORMAL HIGH (ref 70–99)
Phosphorus: 6.1 mg/dL — ABNORMAL HIGH (ref 2.5–4.6)
Potassium: 4.4 mmol/L (ref 3.5–5.1)
Sodium: 135 mmol/L (ref 135–145)

## 2019-07-12 LAB — VANCOMYCIN, RANDOM: Vancomycin Rm: 24

## 2019-07-12 MED ORDER — DARBEPOETIN ALFA 150 MCG/0.3ML IJ SOSY
PREFILLED_SYRINGE | INTRAMUSCULAR | Status: AC
  Administered 2019-07-12: 150 ug via INTRAVENOUS
  Filled 2019-07-12: qty 0.3

## 2019-07-12 MED ORDER — HEPARIN SODIUM (PORCINE) 1000 UNIT/ML IJ SOLN
INTRAMUSCULAR | Status: AC
  Administered 2019-07-12: 3400 [IU]
  Filled 2019-07-12: qty 4

## 2019-07-12 NOTE — Progress Notes (Signed)
Patient returned back to floor after dialysis; meds as ordered. Will continue to monitor

## 2019-07-12 NOTE — Progress Notes (Signed)
Patient complaining of sore throat since yesterday. Denied any visitors for the past days. MD aware with order for chloraseptic throat spray. Will monitor.

## 2019-07-12 NOTE — Procedures (Addendum)
   I was present at this dialysis session, have reviewed the session itself and made  appropriate changes [name] MD [place] pager [phone]   07/12/2019, 5:49 PM

## 2019-07-12 NOTE — Progress Notes (Signed)
Pharmacy Antibiotic Note  Frank Fuller is a 58 y.o. male admitted on 07/04/2019 with Osteomyelitis.  Pharmacy has been consulted for Vanco/Cefepime dosing.  Patient continues on Vancomycin and Cefepime for vertebral osteomyelitis/discitis until 10/6 per ID recommendations. WBC is wnl. Patient remains afebrile. Pre-HD vancomycin level today is therapeutic at 24 (goal 15-25 mcg/mL) on current dose of Vancomycin 1 gram post-HD Tuesday/Thursday/Saturday. Vancomycin dose on 9/15 was not charted. His last dose was 9/17 before today.   Today, he underwent 3.5 hours of HD with BFR of 400 ml/min.    8/23 BCx >> neg 8/25 IR disc aspirate >> neg   Plan: - Continue Vanc 1 gram post HD-TTS - Consider a weekly pre-HD VR, next 9/22 - Continue cefepime 2 grams post HD TTS - ID recs 6 weeks IV Vanc + Cefepime  Weight: 188 lb 4.4 oz (85.4 kg)  Temp (24hrs), Avg:99.3 F (37.4 C), Min:98.6 F (37 C), Max:99.8 F (37.7 C)  Recent Labs  Lab 07/07/19 0641 07/08/19 0509 07/09/19 0640 07/10/19 1422 07/11/19 0949 07/12/19 1145 07/12/19 1357  WBC 4.4 4.4 4.1  --  4.5  --  4.8  CREATININE  --   --   --  9.60*  --   --  9.17*  VANCORANDOM  --  20  --   --   --  24  --     Estimated Creatinine Clearance: 10.3 mL/min (A) (by C-G formula based on SCr of 9.17 mg/dL (H)).    Allergies  Allergen Reactions  . Oxycodone Nausea Only  . Pramoxine     Other reaction(s): Skin Rash  . Chlorhexidine Other (See Comments)    Unknown reaction Patch skin test done at dialysis 06/26/17  - staff using clear dressing and alcohol to clean exit site of catheter  . Clonidine Derivatives Other (See Comments)    Dizziness   . Fluorouracil     Other reaction(s): Skin Rash  . Lisinopril Other (See Comments)    unresponsive  . Sensipar  [Cinacalcet Hcl]     Other reaction(s): agitation  . Carvedilol Rash    Agnes Lawrence, PharmD PGY1 Pharmacy Resident

## 2019-07-12 NOTE — Progress Notes (Signed)
Physical Therapy Weekly Progress Note  Patient Details  Name: Frank Fuller MRN: [id number] Date of Birth: 11-10-1960  Beginning of progress report period: July 05, 2019 End of progress report period: July 12, 2019  Today's Date: 07/12/2019 PT Individual Time: 0800-0900 PT Individual Time Calculation (min): 60 min   Patient has met 4 of 4 short term goals.  Pt is making good progress and functional gains in therapy sessions. Pt is able to complete bed mobility with Supervision with use of bedrails, is CGA for squat pivot and slide board transfers bed to/from w/c, is at Supervision level for w/c propulsion with min cueing for management of w/c parts. Pt is able to stand with mod A in // bars, however standing activity and gait training is limited by ongoing loose bowels and frequent bowel incontinence.  Patient continues to demonstrate the following deficits muscle weakness, abnormal tone and unbalanced muscle activation and decreased standing balance, decreased postural control and decreased balance strategies and therefore will continue to benefit from skilled PT intervention to increase functional independence with mobility.  Patient progressing toward long term goals..  Continue plan of care.  PT Short Term Goals Week 1:  PT Short Term Goal 1 (Week 1): Pt will perform supine<>sit with CGA PT Short Term Goal 1 - Progress (Week 1): Met PT Short Term Goal 2 (Week 1): Pt will perform bed<>chair transfers with CGA PT Short Term Goal 2 - Progress (Week 1): Met PT Short Term Goal 3 (Week 1): Pt will initiate sit<>stand transfers and gait training PT Short Term Goal 3 - Progress (Week 1): Met PT Short Term Goal 4 (Week 1): Pt will demonstrate understanding of w/c parts with min cuing and min assist to set-up for bed<>chair transfers PT Short Term Goal 4 - Progress (Week 1): Met Week 2:  PT Short Term Goal 1 (Week 2): Pt will perform bed mobility at mod I level PT Short Term Goal 2  (Week 2): Pt will perform least restrictive transfers at Supervision consistently PT Short Term Goal 3 (Week 2): Pt will perform sit to stand with min A  Skilled Therapeutic Interventions/Progress Updates:    Pt received semi-reclined in bed, agreeable to PT session. No complaints of pain. Pt has had incontinence of bowel, rolling L/R with Supervision and use of bedrail for max A pericare. Pt has another episode of bowel incontinence during cleanup. Pt is dependent to don new brief. Pt is setup A to don pants in long-sitting with rolling L/R to pull pants up over hips. Supine to sit with Supervision. Seated balance EOB with close Supervision while eating breakfast. Squat pivot transfer bed to w/c with CGA. Pt requires min cueing for management of w/c parts. Manual w/c propulsion x 150 ft with use of BUE and Supervision. Squat pivot transfer w/c to/from Nustep with CGA. Nustep level 1 x 5 min with use of BLE with some active-assist required for hip and knee extension. Pt left seated in w/c in room with needs in reach at end of session.  Therapy Documentation Precautions:  Precautions Precautions: Fall Precaution Comments: paraplegic Restrictions Weight Bearing Restrictions: No   Therapy/Group: Individual Therapy   Excell Seltzer, PT, DPT  07/12/2019, 12:10 PM

## 2019-07-12 NOTE — Progress Notes (Signed)
[place] PHYSICAL MEDICINE & REHABILITATION PROGRESS NOTE   Subjective/Complaints: Patient seen sitting up at the edge of his bed working with therapy this morning.  He states he slept "beautiful" last night.  He notes significant improvement with change in medications.  Also called by nursing overnight regarding sore throat.  Discussed with nursing at that time, patient has not had any visitors, white count, fevers, COVID negative PTA.  This morning patient notes throat soreness is improved.  ROS: Denies CP, SOB, N/V/D  Objective:   No results found. Recent Labs    07/11/19 0949  WBC 4.5  HGB 10.4*  HCT 32.0*  PLT 128*   Recent Labs    07/10/19 1422  NA 134*  K 4.5  CL 96*  CO2 24  GLUCOSE 100*  BUN 55*  CREATININE 9.60*  CALCIUM 9.1    Intake/Output Summary (Last 24 hours) at 07/12/2019 1323 Last data filed at 07/12/2019 0900 Gross per 24 hour  Intake 580 ml  Output -  Net 580 ml     Physical Exam: Vital Signs Blood pressure (!) 179/97, pulse 84, temperature 99.4 F (37.4 C), temperature source Oral, resp. rate 19, weight 85.7 kg, SpO2 92 %. Constitutional: No distress . Vital signs reviewed. HENT: Normocephalic.  Atraumatic. Eyes: EOMI. No discharge. Cardiovascular: No JVD. Respiratory: Normal effort.  No stridor. GI: Non-distended. Skin: Warm and dry.  Intact. Psych: Normal mood.  Normal behavior. Musc: No edema in extremities.  No tenderness in extremities. Neurological: Alert Motor: Bilateral upper extremities: 5/5 proximal distal Right lower extremity: Hip flexion, knee extension 2/5, ankle dorsiflexion 4/5, stable Left lower extremity: Hip flexion 2-/5, knee extension 2+/5, ankle dorsiflexion 4/5, stable  Assessment/Plan: 1. Functional deficits secondary to T10 Spinal cord infarct which require 3+ hours per day of interdisciplinary therapy in a comprehensive inpatient rehab setting.  Physiatrist is providing close team supervision and 24 hour  management of active medical problems listed below.  Physiatrist and rehab team continue to assess barriers to discharge/monitor patient progress toward functional and medical goals  Care Tool:  Bathing  Bathing activity did not occur: Refused Body parts bathed by patient: Right arm, Left arm, Chest, Abdomen, Front perineal area, Right upper leg, Left upper leg, Right lower leg, Left lower leg, Face   Body parts bathed by helper: Buttocks     Bathing assist Assist Level: Minimal Assistance - Patient > 75%     Upper Body Dressing/Undressing Upper body dressing   What is the patient wearing?: Pull over shirt    Upper body assist Assist Level: Set up assist    Lower Body Dressing/Undressing Lower body dressing      What is the patient wearing?: Pants     Lower body assist Assist for lower body dressing: Supervision/Verbal cueing(bed level)     Toileting Toileting    Toileting assist Assist for toileting: Total Assistance - Patient < 25%     Transfers Chair/bed transfer  Transfers assist     Chair/bed transfer assist level: Contact Guard/Touching assist     Locomotion Ambulation   Ambulation assist   Ambulation activity did not occur: Safety/medical concerns          Walk 10 feet activity   Assist  Walk 10 feet activity did not occur: Safety/medical concerns        Walk 50 feet activity   Assist Walk 50 feet with 2 turns activity did not occur: Safety/medical concerns         Walk  150 feet activity   Assist Walk 150 feet activity did not occur: Safety/medical concerns         Walk 10 feet on uneven surface  activity   Assist Walk 10 feet on uneven surfaces activity did not occur: Safety/medical concerns         Wheelchair     Assist Will patient use wheelchair at discharge?: Yes Type of Wheelchair: Manual    Wheelchair assist level: Supervision/Verbal cueing Max wheelchair distance: 150'    Wheelchair 50 feet  with 2 turns activity    Assist        Assist Level: Supervision/Verbal cueing   Wheelchair 150 feet activity     Assist  Wheelchair 150 feet activity did not occur: Safety/medical concerns   Assist Level: Supervision/Verbal cueing   Blood pressure (!) 179/97, pulse 84, temperature 99.4 F (37.4 C), temperature source Oral, resp. rate 19, weight 85.7 kg, SpO2 92 %.  Medical Problem List and Plan: 1.  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   Continue CIR 2.  Antithrombotics:  -DVT/anticoagulation: SCDs.  LE vascular study limited, but negative for DVT.             -antiplatelet therapy: Low-dose aspirin 81 mg daily 3. Pain Management:   Hydrocodone q 6 h prn increase to every 4 hours as needed, increased to 5-10/325 every 4 hours as needed on 9/16  Added oxy CR at noc low dose, monitor for nausea              -prn tylenol             -prn robaxin for spasms  Added tucks QID and lidocaine QID  Gabapentin 100 nightly started on 9/17, increased to 200 on 9/18 with good improvement for neuropathic pain  Will consider weaning medications next week 4. Mood: Provide emotional support. Pt somewhat disheartened by multiple medical setbacks.              -antipsychotic agents: N/A 5. Neuropsych: This patient is capable of making decisions on his own behalf. 6. Skin/Wound Care: Routine skin checks 7. Fluids/Electrolytes/Nutrition: Routine in and outs 8. ID: Continue vancomycin as well as Maxipime through 07/29/2019 per infectious disease  Random level WNL on 9/19 9. Rectal ulcers/H. pylori.  Underwent EGD and flexible sigmoidoscopy 06/24/2019.  Follow-up per GI services.  Received Carafate enema x2 weeks as well as Flagyl x14 days initiated 06/26/2019 and tetracycline x14 days, completed course on 9/17.  -GI following and have discussed with pt re: continuing enemas  -continue  to follow hgbs and if any significant bleeding recurs he will need flex sig per Dr. Doyne Keel note  Hemoglobin 10.4 on 9/18,?  Accuracy, labs ordered  Continue to monitor 10.  Acute on chronic normocytic anemia.  Patient did receive 2 units packed red blood cells 06/23/2019 and 1 unit PRBC 07/01/2019 and remains on Aranesp.    See #9. 11.  End-stage renal disease.  Continue hemodialysis per renal services.             -HD after therapies to maximize rehab participation 12.  Thoracic aortic dissection.  Status post endovascular stent placement 06/06/2019.  Follow-up per Dr. Oneida Alar 13.  Neurogenic bowel bladder. I's and O's reviewed.   -He is essentially anuric.    -He is moving bowels, patterns somewhat skewed by GIB noted above. Continue to monitor for pattern 14.  Hypertension as well as history  of first-degree AV block.  Norvasc 10 mg nightly, hydralazine 100 mg every 8 hours, labetalol 300 mg twice daily.  Monitor with increased mobility in therapies.   Spironolactone 25 daily started on 9/13  Losartan 100 daily started on 9/18 per Nephro recs  Remains extremely elevated on 9/19 with hypertensive crisis, patient missed his dose of losartan yesterday   Need to have better blood pressure control Vitals:   07/11/19 2039 07/12/19 0332  BP: (!) 185/99 (!) 179/97  Pulse: 84 84  Resp: 18 19  Temp: 99.8 F (37.7 C) 99.4 F (37.4 C)  SpO2: 98% 92%  15.  Hyperlipidemia.  Lipitor 16.  Tobacco abuse.  Counseling 17.  Thrombocytopenia  Platelets 128 on 9/18, labs ordered   Continue to monitor   LOS: 8 days A FACE TO FACE EVALUATION WAS PERFORMED   Lorie Phenix 07/12/2019, 1:23 PM

## 2019-07-13 ENCOUNTER — Inpatient Hospital Stay (HOSPITAL_COMMUNITY): Payer: Medicare Other | Admitting: Occupational Therapy

## 2019-07-13 DIAGNOSIS — K592 Neurogenic bowel, not elsewhere classified: Secondary | ICD-10-CM

## 2019-07-13 NOTE — Progress Notes (Signed)
[facility] PHYSICAL MEDICINE & REHABILITATION PROGRESS NOTE   Subjective/Complaints: Patient seen laying in bed working with therapy this morning.  He states he slept well overnight.  He notes he had a bowel movement this morning, but later had some leakage.  Discussed with therapies.  Discussed with nursing as well possibility of impaction.  ROS: Denies CP, SOB, N/V/D  Objective:   No results found. Recent Labs    07/11/19 0949 07/12/19 1357  WBC 4.5 4.8  HGB 10.4* 9.1*  HCT 32.0* 29.6*  PLT 128* 141*   Recent Labs    07/10/19 1422 07/12/19 1357  NA 134* 135  K 4.5 4.4  CL 96* 97*  CO2 24 25  GLUCOSE 100* 132*  BUN 55* 49*  CREATININE 9.60* 9.17*  CALCIUM 9.1 9.3    Intake/Output Summary (Last 24 hours) at 07/13/2019 1144 Last data filed at 07/12/2019 1847 Gross per 24 hour  Intake 100 ml  Output 4000 ml  Net -3900 ml     Physical Exam: Vital Signs Blood pressure (!) 182/108, pulse 81, temperature 99.2 F (37.3 C), temperature source Oral, resp. rate 16, weight 83.2 kg, SpO2 99 %. Constitutional: No distress . Vital signs reviewed. HENT: Normocephalic.  Atraumatic. Eyes: EOMI. No discharge. Cardiovascular: No JVD. Respiratory: Normal effort.  No stridor. GI: Non-distended. Skin: Warm and dry.  Intact. Psych: Normal mood.  Normal behavior. Musc: No edema in extremities.  No tenderness in extremities. Neurological: Alert Motor: Bilateral upper extremities: 5/5 proximal distal, unchanged Right lower extremity: Hip flexion, knee extension 2/5, ankle dorsiflexion 4/5, unchanged Left lower extremity: Hip flexion 2-/5, knee extension 2+/5, ankle dorsiflexion 4/5, unchanged  Assessment/Plan: 1. Functional deficits secondary to T10 Spinal cord infarct which require 3+ hours per day of interdisciplinary therapy in a comprehensive inpatient rehab setting.  Physiatrist is providing close team supervision and 24 hour management of active medical problems listed  below.  Physiatrist and rehab team continue to assess barriers to discharge/monitor patient progress toward functional and medical goals  Care Tool:  Bathing  Bathing activity did not occur: Refused Body parts bathed by patient: Right arm, Left arm, Chest, Abdomen, Front perineal area, Right upper leg, Left upper leg, Right lower leg, Left lower leg, Face   Body parts bathed by helper: Buttocks     Bathing assist Assist Level: Minimal Assistance - Patient > 75%     Upper Body Dressing/Undressing Upper body dressing   What is the patient wearing?: Pull over shirt    Upper body assist Assist Level: Set up assist    Lower Body Dressing/Undressing Lower body dressing      What is the patient wearing?: Pants, Incontinence brief     Lower body assist Assist for lower body dressing: Minimal Assistance - Patient > 75%(Assist for brief; alll completed at bed level)     Toileting Toileting    Toileting assist Assist for toileting: Total Assistance - Patient < 25%     Transfers Chair/bed transfer  Transfers assist     Chair/bed transfer assist level: Contact Guard/Touching assist     Locomotion Ambulation   Ambulation assist   Ambulation activity did not occur: Safety/medical concerns          Walk 10 feet activity   Assist  Walk 10 feet activity did not occur: Safety/medical concerns        Walk 50 feet activity   Assist Walk 50 feet with 2 turns activity did not occur: Safety/medical concerns  Walk 150 feet activity   Assist Walk 150 feet activity did not occur: Safety/medical concerns         Walk 10 feet on uneven surface  activity   Assist Walk 10 feet on uneven surfaces activity did not occur: Safety/medical concerns         Wheelchair     Assist Will patient use wheelchair at discharge?: Yes Type of Wheelchair: Manual    Wheelchair assist level: Supervision/Verbal cueing Max wheelchair distance: 150'     Wheelchair 50 feet with 2 turns activity    Assist        Assist Level: Supervision/Verbal cueing   Wheelchair 150 feet activity     Assist  Wheelchair 150 feet activity did not occur: Safety/medical concerns   Assist Level: Supervision/Verbal cueing   Blood pressure (!) 182/108, pulse 81, temperature 99.2 F (37.3 C), temperature source Oral, resp. rate 16, weight 83.2 kg, SpO2 99 %.  Medical Problem List and Plan: 1.  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   Continue CIR 2.  Antithrombotics:  -DVT/anticoagulation: SCDs.  LE vascular study limited, but negative for DVT.             -antiplatelet therapy: Low-dose aspirin 81 mg daily 3. Pain Management:   Hydrocodone q 6 h prn increase to every 4 hours as needed, increased to 5-10/325 every 4 hours as needed on 9/16  Added oxy CR at noc low dose, monitor for nausea              -prn tylenol             -prn robaxin for spasms  Added tucks QID and lidocaine QID  Gabapentin 100 nightly started on 9/17, increased to 200 on 9/18 with good improvement for neuropathic pain  Will consider weaning medications this week 4. Mood: Provide emotional support. Pt somewhat disheartened by multiple medical setbacks.              -antipsychotic agents: N/A 5. Neuropsych: This patient is capable of making decisions on his own behalf. 6. Skin/Wound Care: Routine skin checks 7. Fluids/Electrolytes/Nutrition: Routine in and outs 8. ID: Continue vancomycin as well as Maxipime through 07/29/2019 per infectious disease  Random level WNL on 9/19 9. Rectal ulcers/H. pylori.  Underwent EGD and flexible sigmoidoscopy 06/24/2019.  Follow-up per GI services.  Received Carafate enema x2 weeks as well as Flagyl x14 days initiated 06/26/2019 and tetracycline x14 days, completed course on 9/17.  -GI following and have discussed with pt re:  continuing enemas  -continue to follow hgbs and if any significant bleeding recurs he will need flex sig per Dr. Doyne Keel note  Hemoglobin 9.1 on 9/19  Continue to monitor 10.  Acute on chronic normocytic anemia.  Patient did receive 2 units packed red blood cells 06/23/2019 and 1 unit PRBC 07/01/2019 and remains on Aranesp.    See #9. 11.  End-stage renal disease.  Continue hemodialysis per renal services.             -HD after therapies to maximize rehab participation 12.  Thoracic aortic dissection.  Status post endovascular stent placement 06/06/2019.  Follow-up per Dr. Oneida Alar 13.  Neurogenic bowel bladder. I's and O's reviewed.   -He is essentially anuric.    -We will have patient sit up after administration for bowel movement, nursing to check for impaction 14.  Hypertension as well as history of first-degree AV  block.  Norvasc 10 mg nightly, hydralazine 100 mg every 8 hours, labetalol 300 mg twice daily.  Monitor with increased mobility in therapies.   Spironolactone 25 daily started on 9/13  Losartan 100 daily started on 9/18 per Nephro recs  Remains extremely elevated on 9/20 with hypertensive crisis, however patient missed several doses of medications yesterday.  Will need to be more diligent about administering medications, particularly given #12  Need to have better blood pressure control Vitals:   07/12/19 2003 07/13/19 0520  BP: (!) 195/96 (!) 182/108  Pulse: 85 81  Resp: 16 16  Temp: 99 F (37.2 C) 99.2 F (37.3 C)  SpO2: 96% 99%  15.  Hyperlipidemia.  Lipitor 16.  Tobacco abuse.  Counseling 17.  Thrombocytopenia  Platelets 141 on 9/19  Continue to monitor 18.  Hyperglycemia  Elevated on 9/19, continue to monitor   LOS: 9 days A FACE TO FACE EVALUATION WAS PERFORMED  Frank Fuller 07/13/2019, 11:44 AM

## 2019-07-13 NOTE — Progress Notes (Signed)
Occupational Therapy Session Note  Patient Details  Name: Deriel Cavanah MRN: [id number] Date of Birth: [birth year]  Today's Date: 07/13/2019 OT Individual Time: [id number] OT Individual Time Calculation (min): 75 min    Short Term Goals: Week 2:  OT Short Term Goal 1 (Week 2): Pt will complete 3/3 toileting tasks on BSC with mod A without use of mechanical lift in prep for d/c home at w/c level OT Short Term Goal 2 (Week 2): Pt will set-up all equipment needed for sliding board transfer with supervision/VC only in prep for transfers at mod I level OT Short Term Goal 3 (Week 2): Pt will don socks/shoes with supervision using AE PRN  Skilled Therapeutic Interventions/Progress Updates:    Pt seen for OT session focusing on ADL re-training and SCI education. Pt awake in supine upon arrival, agreeable to tx session. Upon therapist's arrival pt used call bell to call for nursing staff to assist him following incontinent bowel, pt reports he voided ~20 minutes ago, however, never called for assist in getting cleaned up. Therapist provided total A bed level for pericare/buttock hygiene and donning new brief following incontinent episode and extensive education provided regarding importance of skin protection and self-advocacy. Encouraged pt to get up to [facility] with nursing staff following enema admission as part of bowel program.   Pt came to sitting EOB with min A, one total posterior LOB back onto bed when attempting to get to EOB initially. Upon completing pericare seated EOB, pt noted to have had another incontinent BM. Returned back to supine and rolled with supervision using hospital bed functions while hygiene completed again and new brief donned. Pt becoming tearful with this, frustrated by bowels, education and empathetic listening provided.  He donned pants at bed level with supervision, combination of rolling and bridging to pull pants up. Completed min A squat pivot transfer to w/c with VCs for  technique. Grooming tasks from w/c level at sink mod I.  Pt self-propelled w/c to therapy gym. Able to set-up w/c in prep for transfer with min cuing. Squat pivot to mat with close supervision.  Completed push-ups along EOM focusing on anterior wegithtshift and clearing buttock in prep for transfers, completed up/down mat x2 with rest breaks required throughout.  Transitioned to long sitting on mat with mod A. Education provided regarding functional use of long sitting. Pt able to manage LEs into partial circle sit with each LE and extended hip flexor/groin stretch completed x2 each side. Pt returned to w/c via squat pivot with supervision, much improved technique and buttock clearance this time. Pt returned to room at end of session, left seated in w/c with all needs in reach and NT present.   Therapy Documentation Precautions:  Precautions Precautions: Fall Precaution Comments: paraplegic Restrictions Weight Bearing Restrictions: No   Therapy/Group: Individual Therapy  Vassie Kugel L 07/13/2019, 6:35 AM

## 2019-07-14 ENCOUNTER — Inpatient Hospital Stay (HOSPITAL_COMMUNITY): Payer: Medicare Other | Admitting: Occupational Therapy

## 2019-07-14 ENCOUNTER — Inpatient Hospital Stay (HOSPITAL_COMMUNITY): Payer: Medicare Other | Admitting: Physical Therapy

## 2019-07-14 MED ORDER — MAGIC MOUTHWASH W/LIDOCAINE
10.0000 mL | Freq: Four times a day (QID) | ORAL | Status: AC
  Administered 2019-07-14 – 2019-07-17 (×13): 10 mL via ORAL
  Filled 2019-07-14 (×28): qty 10

## 2019-07-14 MED ORDER — FLUCONAZOLE 200 MG PO TABS
200.0000 mg | ORAL_TABLET | Freq: Once | ORAL | Status: AC
  Administered 2019-07-14: 11:00:00 200 mg via ORAL
  Filled 2019-07-14: qty 1

## 2019-07-14 MED ORDER — FLUCONAZOLE 100 MG PO TABS
100.0000 mg | ORAL_TABLET | Freq: Every day | ORAL | Status: AC
  Administered 2019-07-15 – 2019-07-20 (×6): 100 mg via ORAL
  Filled 2019-07-14 (×6): qty 1

## 2019-07-14 MED FILL — Water For Irrigation, Sterile Irrigation Soln: Qty: 40 | Status: AC

## 2019-07-14 MED FILL — Sucralfate Susp 1 GM/10ML: ORAL | Qty: 20 | Status: AC

## 2019-07-14 NOTE — Progress Notes (Signed)
[facility] Progress Note   Subjective:  Seen in room. Sleepy. No new complaints.   Objective Vitals:   07/13/19 0520 07/13/19 1332 07/13/19 1957 07/14/19 0458  BP: (!) 182/108 (!) 171/95 (!) 189/91 (!) 179/94  Pulse: 81 83 80 80  Resp: 16 16 16 18   Temp: 99.2 F (37.3 C) 98 F (36.7 C) 99.1 F (37.3 C) 97.7 F (36.5 C)  TempSrc: Oral Oral Oral Oral  SpO2: 99% 98% 98% 99%  Weight:        Physical Exam General: WNWD lying in bed, snoring NAD Heart: RRR Lungs: CTAB  Abdomen: soft NTND Extremities: No LE edema  Dialysis Access: R IJ TDC   Weight change:    Additional Objective Labs: Basic Metabolic Panel: Recent Labs  Lab 07/10/19 1422 07/12/19 1357  NA 134* 135  K 4.5 4.4  CL 96* 97*  CO2 24 25  GLUCOSE 100* 132*  BUN 55* 49*  CREATININE 9.60* 9.17*  CALCIUM 9.1 9.3  PHOS 6.2* 6.1*   CBC: Recent Labs  Lab 07/08/19 0509 07/09/19 0640 07/11/19 0949 07/12/19 1357  WBC 4.4 4.1 4.5 4.8  HGB 9.0* 8.9* 10.4* 9.1*  HCT 28.3* 28.4* 32.0* 29.6*  MCV 92.5 93.4 93.3 95.5  PLT 169 165 128* 141*   Blood Culture    Component Value Date/Time   SDES BLOOD LEFT HAND 07/03/2019 1644   SPECREQUEST  07/03/2019 1644    BOTTLES DRAWN AEROBIC ONLY Blood Culture adequate volume   CULT  07/03/2019 1644    NO GROWTH 5 DAYS Performed at [facility], [place] [address], [place], [place] [address]    REPTSTATUS 07/08/2019 FINAL 07/03/2019 1644     Medications: . ceFEPime (MAXIPIME) IV 2 g (07/12/19 1819)  . vancomycin Stopped (07/12/19 1648)   . amLODipine  10 mg Oral QHS  . atorvastatin  20 mg Oral q1800  . Chlorhexidine Gluconate Cloth  6 each Topical Q0600  . citalopram  10 mg Oral Daily  . darbepoetin (ARANESP) injection - DIALYSIS  150 mcg Intravenous Q Sat-HD  . feeding supplement (NEPRO CARB STEADY)  237 mL Oral BID BM  . [START ON 07/15/2019] fluconazole  100 mg Oral QHS  . gabapentin  200 mg Oral QHS  . hydrALAZINE  100 mg Oral Q8H  .  labetalol  300 mg Oral BID  . lanthanum  1,000 mg Oral TID WC  . lidocaine  1 application Topical Once  . losartan  100 mg Oral Daily  . magic mouthwash w/lidocaine  10 mL Oral QID  . multivitamin  1 tablet Oral QHS  . oxyCODONE  10 mg Oral QHS  . pantoprazole  40 mg Oral BID  . spironolactone  25 mg Oral Daily  . Sucralfate Enema (Sucralfate 2 g/Sterile Water 60 ml Enema)  2 g Rectal Q12H  . witch hazel-glycerin   Topical QID    Dialysis Orders:  [facility]  4h 68min   400/1.5  87.5kg  2/2.25  P2   R TDC   Hep none - Parsabiv 2.5mg  IV q HD  - Calcitriol 1.38mcg PO q HD  Assessment/Plan: 1. Spinal cord infarct/ paraperesis: per spinal MRI 8/23at T9- T11, following presentation with inability to move his legs. Neuro, NSurg and VVS consulted, all felt etiology was likely spinal artery injury related to aortic dissection procedure.  All recommended supportive care and hope for recovery. Per NSurg, no indication for surgery. Continue treatment with aspirin recommended.  Now admitted to  CIR for ongoing physical therapy/Occupational Therapy along with psychotherapy and pain management. 2. ESRD:Continue HD on TTS schedule - no heparin. HD Sat 3. RecentType B aortic dissection (s/p stent graft repair 8/14):Goal SBP per VVS 120-170. Known severe resistant HTN. Continue to avoid intradialytic hypotension. On ASA 4. HTN/volume: Blood pressures remain high. Now on amlodipine 10, hydralazine 100 tid, labetalol 300 bid.  Spironolactone 25, losartan 100 added here .  If BP's still uncontrolled next step would try clonidine patch #2 or #3. Pushing UF as tolerated.  5. L4-L5 lumbar discitis: s/p recent aortic stentgraft. Plans noted for intravenous vancomycin and cefepime until 07/29/2019.  Earlier blood cultures negative. 6. Hematochezia- recurrent:GI consulted.Flex sig 9/1 showedstigmata of recent bleedinganddiffusely ulcerated rectum.EGD with fewnon-bleeding erosions in gastric antrum. Rx'd  w/ miralax, carafate < 2 weeks. Repeat Flex sig 9/4 withhemostatic spray to entire rectum.Rx'd Tetracycline + Flagyl for possible H.pylori 7. Anemia(ESRD + ABLA): Hgb 9.1. On Aranesp 150 q Sat. transfusions when hemoglobin <7 g/deciliter. 8. Secondary hyperparathyroidism - Phos elevated. Continue calcitriol. Parsabiv not on hospital formulary.Binders changed to Fosrenol d/t concern that Auryxia irritating his bowels 9. Nutrition: Significant hypoalbuminemia noted likely following acute hospitalization/surgery.  Continue renal diet with ONS. 10. Recent MSSA bacteremia/AVG infection:S/p AVG removal June/ July andAncefcoursecompleted. 11. Depression -following recent sequence of clinical events.   On citalopram.   Lynnda Child PA-C [facility] Pager [phone] 07/14/2019,11:33 AM  LOS: 10 days

## 2019-07-14 NOTE — Progress Notes (Signed)
Occupational Therapy Session Note  Patient Details  Name: Trendon Naimi MRN: [id number] Date of Birth: [birth year]  Today's Date: 07/14/2019 OT Individual Time: [id number] and 1015-1025 OT Individual Time Calculation (min): 25 min and 10 min OT Missed Time: 10 Min (fatigue/pain)   Short Term Goals: Week 2:  OT Short Term Goal 1 (Week 2): Pt will complete 3/3 toileting tasks on BSC with mod A without use of mechanical lift in prep for d/c home at w/c level OT Short Term Goal 2 (Week 2): Pt will set-up all equipment needed for sliding board transfer with supervision/VC only in prep for transfers at mod I level OT Short Term Goal 3 (Week 2): Pt will don socks/shoes with supervision using AE PRN  Skilled Therapeutic Interventions/Progress Updates:    Session One: PT seen for OT session focusing on functional mobility and ADL re-training. Pt awake in supine upon arrival, agreeable to tx session and denying pain. He transferred to sitting EOB with supervision using hospital bed functions.  Donned shoes seated EOB with min A overall, VCs for problem solving technique for increase ease and safety with task to bring LE propped on bed, pt successful and liking this method. CGA squat pivot transfer to w/c. Completed UB bathing and grooming tasks from w/c level at sink mod I. Self propelled w/c to therapy gym mod I. Transitioned onto therapy mat with min VCs for proper set-up of w/c in prep for transfer. Transitioned to long sitting on mat with guarding assist. Hamstring stretch in long sitting. Pt demonstrates ability to don/doff shoes in long sitting position with supervision. Pt provided with ltion and completed skin care from long sitting position on mat.  Pt left seated on mat at end of session with hand off to PT.   Session Two: Pt in supine upon arrival, sleeping soundly and requiring increased time and stimuli to awaken. Upon awakening, pt immediately refusing therapy 2/2 "butt pain" and feeling  "raw" in buttock. Reports being up to date on pain medication. Pt agreeable to reposition into sidelying for comfort. Transitioned to L side-lying using hospital bed functions. Pillows placed btwn legs for comfort. Pt voiced feeling relief with change in position, however, cont to refuse tx.  Pt left in side-lying with all needs in reach and bed alarm on.  Education provided throughout regarding benefits and importance of OOB and functional mobility/activity.   Therapy Documentation Precautions:  Precautions Precautions: Fall Precaution Comments: paraplegic Restrictions Weight Bearing Restrictions: No   Therapy/Group: Individual Therapy  Aidin Doane L 07/14/2019, 7:07 AM

## 2019-07-14 NOTE — Progress Notes (Signed)
Physical Therapy Session Note  Patient Details  Name: Frank Fuller MRN: [id number] Date of Birth: 1961/08/04  Today's Date: 07/14/2019 PT Individual Time: 0800-0853 PT Individual Time Calculation (min): 53 min   Short Term Goals: Week 2:  PT Short Term Goal 1 (Week 2): Pt will perform bed mobility at mod I level PT Short Term Goal 2 (Week 2): Pt will perform least restrictive transfers at Supervision consistently PT Short Term Goal 3 (Week 2): Pt will perform sit to stand with min A  Skilled Therapeutic Interventions/Progress Updates:    Pt received seated on therapy mat in gym from OT session. No complaints of pain. [name] [employer] present from [employer] for customized [employer] wheelchair evaluation. Pt is able to participate in evaluation and discuss his wants/needs from a wheelchair, discuss cushion types, wheelchair back options, and tire options. Pt is able to perform mobility on mat with Supervision assist. Squat pivot transfer w/c to/from mat table with close SBA to CGA. Supine BLE strengthening therex with AAROM for RLE: SKFO, hip abd, quad sets. Pt has incontinence of bowel during therex on mat. Pt is setup A to don shoes while in long-sitting on mat. Squat pivot transfer back to w/c with CGA. Manual w/c propulsion x 150 ft with use of BUE and Supervision. Squat pivot transfer back to bed with CGA. Sit to supine Supervision. Rolling L/R with Supervision and use of bedrails for dependent pericare, max A to change pants and brief following bowel incontinence. Pt left supine in bed with needs in reach at end of session.  Therapy Documentation Precautions:  Precautions Precautions: Fall Precaution Comments: paraplegic Restrictions Weight Bearing Restrictions: No    Therapy/Group: Individual Therapy   Excell Seltzer, PT, DPT  07/14/2019, 12:10 PM

## 2019-07-14 NOTE — Progress Notes (Signed)
[facility] PHYSICAL MEDICINE & REHABILITATION PROGRESS NOTE   Subjective/Complaints:   Pt reports unable to sleep- Due to "very very sore throat". For the last 2-3 days. Hard to swallow.   ROS: Denies CP, SOB, N/V/D  Objective:   No results found. Recent Labs    07/12/19 1357  WBC 4.8  HGB 9.1*  HCT 29.6*  PLT 141*   Recent Labs    07/12/19 1357  NA 135  K 4.4  CL 97*  CO2 25  GLUCOSE 132*  BUN 49*  CREATININE 9.17*  CALCIUM 9.3    Intake/Output Summary (Last 24 hours) at 07/14/2019 0959 Last data filed at 07/14/2019 0901 Gross per 24 hour  Intake 840 ml  Output -  Net 840 ml     Physical Exam: Vital Signs Blood pressure (!) 179/94, pulse 80, temperature 97.7 F (36.5 C), temperature source Oral, resp. rate 18, weight 83.2 kg, SpO2 99 %. Constitutional: No distress . Vital signs reviewed. Awake, alert, appropriate, up in manual w/c, in hallway, NAD HENT: Normocephalic.  Atraumatic. Eyes: EOMI. No discharge. Cardiovascular: RRR Respiratory: CTA B/L GI: Non-distended. Skin: Warm and dry.  Intact. Psych: Normal mood.  Normal behavior. Musc: No edema in extremities.  No tenderness in extremities. Neurological: Alert Motor: Bilateral upper extremities: 5/5 proximal distal, unchanged Right lower extremity: Hip flexion, knee extension 2/5, ankle dorsiflexion 4/5, unchanged Left lower extremity: Hip flexion 2-/5, knee extension 2+/5, ankle dorsiflexion 4/5, unchanged  Assessment/Plan: 1. 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  Physiatrist is providing close team supervision and 24 hour management of active medical problems listed below.  Physiatrist and rehab team continue to assess barriers to discharge/monitor patient progress toward functional and medical goals  Care Tool:  Bathing  Bathing activity did not occur:  Refused Body parts bathed by patient: Right arm, Left arm, Chest, Abdomen, Front perineal area, Right upper leg, Left upper leg, Right lower leg, Left lower leg, Face   Body parts bathed by helper: Buttocks     Bathing assist Assist Level: Minimal Assistance - Patient > 75%     Upper Body Dressing/Undressing Upper body dressing   What is the patient wearing?: Pull over shirt    Upper body assist Assist Level: Set up assist    Lower Body Dressing/Undressing Lower body dressing      What is the patient wearing?: Pants, Incontinence brief     Lower body assist Assist for lower body dressing: Minimal Assistance - Patient > 75%(Assist for brief; alll completed at bed level)     Toileting Toileting    Toileting assist Assist for toileting: Total Assistance - Patient < 25%     Transfers Chair/bed transfer  Transfers assist     Chair/bed transfer assist level: Contact Guard/Touching assist     Locomotion Ambulation   Ambulation assist   Ambulation activity did not occur: Safety/medical concerns          Walk 10 feet activity   Assist  Walk 10 feet activity did not occur: Safety/medical concerns        Walk 50 feet activity   Assist Walk 50 feet with 2 turns activity did not occur: Safety/medical concerns         Walk 150 feet activity   Assist Walk 150 feet activity did not occur: Safety/medical concerns  Walk 10 feet on uneven surface  activity   Assist Walk 10 feet on uneven surfaces activity did not occur: Safety/medical concerns         Wheelchair     Assist Will patient use wheelchair at discharge?: Yes Type of Wheelchair: Manual    Wheelchair assist level: Supervision/Verbal cueing Max wheelchair distance: 150'    Wheelchair 50 feet with 2 turns activity    Assist        Assist Level: Supervision/Verbal cueing   Wheelchair 150 feet activity     Assist  Wheelchair 150 feet activity did not  occur: Safety/medical concerns   Assist Level: Supervision/Verbal cueing   Blood pressure (!) 179/94, pulse 80, temperature 97.7 F (36.5 C), temperature source Oral, resp. rate 18, weight 83.2 kg, SpO2 99 %.  Medical Problem List and Plan: 1.  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   Continue CIR 2.  Antithrombotics:  -DVT/anticoagulation: SCDs.  LE vascular study limited, but negative for DVT.             -antiplatelet therapy: Low-dose aspirin 81 mg daily 3. Pain Management:   Hydrocodone q 6 h prn increase to every 4 hours as needed, increased to 5-10/325 every 4 hours as needed on 9/16  Added oxy CR at noc low dose, monitor for nausea              -prn tylenol             -prn robaxin for spasms  Added tucks QID and lidocaine QID  Gabapentin 100 nightly started on 9/17, increased to 200 on 9/18 with good improvement for neuropathic pain  Will consider weaning medications this week 4. Mood: Provide emotional support. Pt somewhat disheartened by multiple medical setbacks.              -antipsychotic agents: N/A 5. Neuropsych: This patient is capable of making decisions on his own behalf. 6. Skin/Wound Care: Routine skin checks 7. Fluids/Electrolytes/Nutrition: Routine in and outs 8. ID: Continue vancomycin as well as Maxipime through 07/29/2019 per infectious disease  Random level WNL on 9/19 9. Rectal ulcers/H. pylori.  Underwent EGD and flexible sigmoidoscopy 06/24/2019.  Follow-up per GI services.  Received Carafate enema x2 weeks as well as Flagyl x14 days initiated 06/26/2019 and tetracycline x14 days, completed course on 9/17.  -GI following and have discussed with pt re: continuing enemas  -continue to follow hgbs and if any significant bleeding recurs he will need flex sig per Dr. Doyne Keel note  Hemoglobin 9.1 on 9/19  Continue to monitor 10.  Acute on chronic  normocytic anemia.  Patient did receive 2 units packed red blood cells 06/23/2019 and 1 unit PRBC 07/01/2019 and remains on Aranesp.    See #9. 11.  End-stage renal disease.  Continue hemodialysis per renal services.             -HD after therapies to maximize rehab participation 12.  Thoracic aortic dissection.  Status post endovascular stent placement 06/06/2019.  Follow-up per Dr. Oneida Alar 13.  Neurogenic bowel bladder. I's and O's reviewed.   -He is essentially anuric.    -We will have patient sit up after administration for bowel movement, nursing to check for impaction 14.  Hypertension as well as history of first-degree AV block.  Norvasc 10 mg nightly, hydralazine 100 mg every 8 hours, labetalol 300 mg twice daily.  Monitor with increased mobility in therapies.  Spironolactone 25 daily started on 9/13  Losartan 100 daily started on 9/18 per Nephro recs  Remains extremely elevated on 9/20 with hypertensive crisis, however patient missed several doses of medications yesterday.  Will need to be more diligent about administering medications, particularly given #12  Need to have better blood pressure control  9/21- BP 180-190/90s still- will d/w Nephrology Vitals:   07/13/19 1957 07/14/19 0458  BP: (!) 189/91 (!) 179/94  Pulse: 80 80  Resp: 16 18  Temp: 99.1 F (37.3 C) 97.7 F (36.5 C)  SpO2: 98% 99%  15.  Hyperlipidemia.  Lipitor 16.  Tobacco abuse.  Counseling 17.  Thrombocytopenia  Platelets 141 on 9/19  Continue to monitor 18.  Hyperglycemia  Elevated on 9/19, continue to monitor 19. Thrush  9/21- will start Diflucan and dose in evening; and Magic Mouthwash    LOS: 10 days A FACE TO FACE EVALUATION WAS PERFORMED  Aliana Kreischer 07/14/2019, 9:59 AM

## 2019-07-14 NOTE — Progress Notes (Signed)
Occupational Therapy Session Note  Patient Details  Name: Frank Fuller MRN: [id number] Date of Birth: 1961/05/24  Today's Date: 07/14/2019 OT Individual Time: [id number] and [id number] OT Individual Time Calculation (min): 38 min  and 39 min Today's Date: 07/14/2019 OT Missed Time: 20 Minutes and 20 mind Missed Time Reason: Patient fatigue;Pain   Short Term Goals: Week 2:  OT Short Term Goal 1 (Week 2): Pt will complete 3/3 toileting tasks on BSC with mod A without use of mechanical lift in prep for d/c home at w/c level OT Short Term Goal 2 (Week 2): Pt will set-up all equipment needed for sliding board transfer with supervision/VC only in prep for transfers at mod I level OT Short Term Goal 3 (Week 2): Pt will don socks/shoes with supervision using AE PRN  Skilled Therapeutic Interventions/Progress Updates:    Session 1: Upon entering the room, pt supine in bed in dark room. Pt with 10/10 c/o pain in buttocks this session. OT assisted pt with further positioning in side lying position for comfort. RN notified of pt requesting pain medication. BP taken with results of 177/91 with pt in side lying position. Pt agreeable to education this session. OT provided pt with SCI binder with focus on education of bowel program and pressure relief. Pt declined practicing pressure relief positions and declined all other intervention at this time. Pt reports," I just hurt too bad. I'm not doing it." OT discussed pt's schedule with him in preparation for OT session this afternoon. Pt missed 20 minutes of skilled OT intervention secondary to pt refusal and increased pain.   Session 2: Upon entering the room, pt supine in bed with continued reports of buttocks pain. Pt in same position he was in several hours ago when this therapist left. OT providing education to pt regarding importance of change in position for pressure relief. Pt's BP taken with results of 159/118 and RN notified. Pt very tearful and reports, "  I can't handle this anymore." OT provided therapeutic use of self and problem solving with pt and RN about plans for bowel program to happen on drop arm commode chair in order to encourage emptying of bowels. Pt declined further OT intervention at this time. Call bell and all needed items within reach.   Therapy Documentation Precautions:  Precautions Precautions: Fall Precaution Comments: paraplegic Restrictions Weight Bearing Restrictions: No General: General OT Amount of Missed Time: 20 Minutes Vital Signs:  Pain: Pain Assessment Pain Scale: 0-10 Pain Score: 5  Pain Type: Chronic pain Pain Location: Buttocks Pain Descriptors / Indicators: Aching;Burning;Discomfort Pain Frequency: Constant Pain Onset: On-going Patients Stated Pain Goal: 0 Pain Intervention(s): Medication (See eMAR) ADL: ADL Eating: Independent Where Assessed-Eating: Bed level Grooming: Setup, Supervision/safety Where Assessed-Grooming: Sitting at sink, Wheelchair Upper Body Dressing: Supervision/safety, Setup Where Assessed-Upper Body Dressing: Wheelchair Lower Body Dressing: Maximal assistance Where Assessed-Lower Body Dressing: Bed level Toileting: Maximal assistance Where Assessed-Toileting: Bed level ADL Comments: not cleared for shower, incontinent of bowel requiring max A to clean up supine in bed   Therapy/Group: Individual Therapy  Frank Fuller 07/14/2019, 12:01 PM

## 2019-07-15 ENCOUNTER — Inpatient Hospital Stay (HOSPITAL_COMMUNITY): Payer: Medicare Other | Admitting: Occupational Therapy

## 2019-07-15 ENCOUNTER — Inpatient Hospital Stay (HOSPITAL_COMMUNITY): Payer: Medicare Other | Admitting: Physical Therapy

## 2019-07-15 LAB — CBC
HCT: 29.2 % — ABNORMAL LOW (ref 39.0–52.0)
Hemoglobin: 9 g/dL — ABNORMAL LOW (ref 13.0–17.0)
MCH: 29.7 pg (ref 26.0–34.0)
MCHC: 30.8 g/dL (ref 30.0–36.0)
MCV: 96.4 fL (ref 80.0–100.0)
Platelets: 150 10*3/uL (ref 150–400)
RBC: 3.03 MIL/uL — ABNORMAL LOW (ref 4.22–5.81)
RDW: 17.3 % — ABNORMAL HIGH (ref 11.5–15.5)
WBC: 4.4 10*3/uL (ref 4.0–10.5)
nRBC: 0 % (ref 0.0–0.2)

## 2019-07-15 LAB — RENAL FUNCTION PANEL
Albumin: 2.1 g/dL — ABNORMAL LOW (ref 3.5–5.0)
Anion gap: 19 — ABNORMAL HIGH (ref 5–15)
BUN: 70 mg/dL — ABNORMAL HIGH (ref 6–20)
CO2: 22 mmol/L (ref 22–32)
Calcium: 9.4 mg/dL (ref 8.9–10.3)
Chloride: 94 mmol/L — ABNORMAL LOW (ref 98–111)
Creatinine, Ser: 12.1 mg/dL — ABNORMAL HIGH (ref 0.61–1.24)
GFR calc Af Amer: 5 mL/min — ABNORMAL LOW (ref >60)
GFR calc non Af Amer: 4 mL/min — ABNORMAL LOW (ref >60)
Glucose, Bld: 121 mg/dL — ABNORMAL HIGH (ref 70–99)
Phosphorus: 5.8 mg/dL — ABNORMAL HIGH (ref 2.5–4.6)
Potassium: 5.2 mmol/L — ABNORMAL HIGH (ref 3.5–5.1)
Sodium: 135 mmol/L (ref 135–145)

## 2019-07-15 MED ORDER — HEPARIN SODIUM (PORCINE) 1000 UNIT/ML IJ SOLN
INTRAMUSCULAR | Status: AC
  Filled 2019-07-15: qty 4

## 2019-07-15 MED ORDER — SODIUM CHLORIDE 0.9 % IV SOLN: 100.0000 mL | INTRAVENOUS | Status: DC | PRN

## 2019-07-15 MED ORDER — ALTEPLASE 2 MG IJ SOLR: 2.0000 mg | Freq: Once | INTRAMUSCULAR | Status: DC | PRN

## 2019-07-15 MED ORDER — LIDOCAINE-PRILOCAINE 2.5-2.5 % EX CREA: 1.0000 "application " | TOPICAL_CREAM | CUTANEOUS | Status: DC | PRN

## 2019-07-15 MED ORDER — HEPARIN SODIUM (PORCINE) 1000 UNIT/ML DIALYSIS: 1000.0000 [IU] | INTRAMUSCULAR | Status: DC | PRN

## 2019-07-15 MED ORDER — VANCOMYCIN HCL IN DEXTROSE 1-5 GM/200ML-% IV SOLN
INTRAVENOUS | Status: AC
  Administered 2019-07-15: 1000 mg via INTRAVENOUS
  Filled 2019-07-15: qty 200

## 2019-07-15 MED ORDER — PENTAFLUOROPROP-TETRAFLUOROETH EX AERO: 1.0000 "application " | INHALATION_SPRAY | CUTANEOUS | Status: DC | PRN

## 2019-07-15 MED FILL — Water For Irrigation, Sterile Irrigation Soln: Qty: 40 | Status: AC

## 2019-07-15 MED FILL — Sucralfate Susp 1 GM/10ML: ORAL | Qty: 20 | Status: AC

## 2019-07-15 NOTE — Progress Notes (Signed)
Occupational Therapy Session Note  Patient Details  Name: Frank Fuller MRN: [id number] Date of Birth: 1961-05-17  Today's Date: 07/15/2019 OT Individual Time: [id number] OT Individual Time Calculation (min): 70 min    Short Term Goals: Week 2:  OT Short Term Goal 1 (Week 2): Pt will complete 3/3 toileting tasks on BSC with mod A without use of mechanical lift in prep for d/c home at w/c level OT Short Term Goal 2 (Week 2): Pt will set-up all equipment needed for sliding board transfer with supervision/VC only in prep for transfers at mod I level OT Short Term Goal 3 (Week 2): Pt will don socks/shoes with supervision using AE PRN  Skilled Therapeutic Interventions/Progress Updates:    Pt seen for OT ADL Bathing/dressing session. Pt sitting up in bed upon arrival, eating breakfast and agreeable to tx session. He reports having slept well last night and denying pain. He declined coming to EOB to eat breakfast. While pt finished breakfast, discussed d/c planning with recommendations for w/c only at d/c and modified ADLs for increased safety and independence. PT reports brother able to assist intermittently throughout the day. Educated regarding DME recommendations and potential option of hospital bed in order to increase independence and safety with ADLs.  Pt instructed in long sitting position in bed to complete LB bathing/dressing. Using bed rail, pt able to bring self into long sitting position with supervision and complete LB/UB bathing with set-up. He returned to supine to complete pericare/buttock hygiene with VCs for technique when completing at bed level. He threaded pants from long sitting position with supervision and completed bridging in order order to advance pants over hips. Shirt donned with set-up. He transferred to sitting EOB with supervision and completed supervision squat pivot transfer to w/c. VCs for w/c parts management.  Grooming tasks completed mod I from w/c level at sink.   Self propelled w/c throughout unit mod I. In ADL apartment, completed x2 trials w/c<> low soft surface couch. First trial with min A with limited buttock clearing. VCs provided for technique and importance of clearing buttock during transfer for skin protection. Second trial completed with supervision with much improved clearance.  Pt returned to room and left seated in w/c at end of session, all needs in reach.   Therapy Documentation Precautions:  Precautions Precautions: Fall Precaution Comments: paraplegic Restrictions Weight Bearing Restrictions: No   Therapy/Group: Individual Therapy  Zan Orlick L 07/15/2019, 6:52 AM

## 2019-07-15 NOTE — Progress Notes (Addendum)
[place] PHYSICAL MEDICINE & REHABILITATION PROGRESS NOTE   Subjective/Complaints:   Pt reports doing bowel program daily-  going "well" rectal pain is better this AM, however so bad yesterday,missed 1 hour of PT/OT. Again, felt like a lot better this AM.  ROS: Denies CP, SOB, N/V/D  Objective:   No results found. Recent Labs    07/12/19 1357  WBC 4.8  HGB 9.1*  HCT 29.6*  PLT 141*   Recent Labs    07/12/19 1357  NA 135  K 4.4  CL 97*  CO2 25  GLUCOSE 132*  BUN 49*  CREATININE 9.17*  CALCIUM 9.3    Intake/Output Summary (Last 24 hours) at 07/15/2019 1301 Last data filed at 07/15/2019 0845 Gross per 24 hour  Intake 740 ml  Output -  Net 740 ml     Physical Exam: Vital Signs Blood pressure (!) 163/89, pulse 82, temperature 98.6 F (37 C), temperature source Oral, resp. rate 18, weight 84.3 kg, SpO2 98 %. Constitutional: No distress . Vital signs reviewed. Awake, alert, appropriate, sitting up in bed, in room, OT at bedside, NAD HENT: Normocephalic.  Atraumatic. Eyes: EOMI. No discharge. Cardiovascular: RRR Respiratory: CTA B/L GI: Non-distended. Skin: Warm and dry.  Intact. Psych: appropriate but flat affect Musc: No edema in extremities.  No tenderness in extremities. Neurological: Alert Motor: Bilateral upper extremities: 5/5 proximal distal, unchanged Right lower extremity: Hip flexion, knee extension 2/5, ankle dorsiflexion 4/5, unchanged Left lower extremity: Hip flexion 2-/5, knee extension 2+/5, ankle dorsiflexion 4/5, unchanged  Assessment/Plan: 1. 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  Physiatrist is providing close team supervision and 24 hour management of active medical problems listed below.  Physiatrist and rehab team continue to assess barriers to discharge/monitor patient progress toward functional and  medical goals  Care Tool:  Bathing  Bathing activity did not occur: Refused Body parts bathed by patient: Right arm, Left arm, Chest, Abdomen, Front perineal area, Right upper leg, Left upper leg, Right lower leg, Left lower leg, Face, Buttocks   Body parts bathed by helper: Buttocks     Bathing assist Assist Level: Supervision/Verbal cueing(Bed level)     Upper Body Dressing/Undressing Upper body dressing   What is the patient wearing?: Pull over shirt    Upper body assist Assist Level: Set up assist    Lower Body Dressing/Undressing Lower body dressing      What is the patient wearing?: Pants, Incontinence brief     Lower body assist Assist for lower body dressing: Minimal Assistance - Patient > 75%(Bed level)     Toileting Toileting    Toileting assist Assist for toileting: Total Assistance - Patient < 25%     Transfers Chair/bed transfer  Transfers assist     Chair/bed transfer assist level: Contact Guard/Touching assist     Locomotion Ambulation   Ambulation assist   Ambulation activity did not occur: Safety/medical concerns          Walk 10 feet activity   Assist  Walk 10 feet activity did not occur: Safety/medical concerns        Walk 50 feet activity   Assist Walk 50 feet with 2 turns activity did not occur: Safety/medical concerns         Walk 150 feet activity   Assist Walk 150 feet activity did not occur: Safety/medical concerns  Walk 10 feet on uneven surface  activity   Assist Walk 10 feet on uneven surfaces activity did not occur: Safety/medical concerns         Wheelchair     Assist Will patient use wheelchair at discharge?: Yes Type of Wheelchair: Manual    Wheelchair assist level: Set up assist Max wheelchair distance: 150'    Wheelchair 50 feet with 2 turns activity    Assist        Assist Level: Set up assist   Wheelchair 150 feet activity     Assist  Wheelchair 150  feet activity did not occur: Safety/medical concerns   Assist Level: Set up assist   Blood pressure (!) 163/89, pulse 82, temperature 98.6 F (37 C), temperature source Oral, resp. rate 18, weight 84.3 kg, SpO2 98 %.  Medical Problem List and Plan: 1.  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   Continue CIR 2.  Antithrombotics:  -DVT/anticoagulation: SCDs.  LE vascular study limited, but negative for DVT.             -antiplatelet therapy: Low-dose aspirin 81 mg daily 3. Pain Management:   Hydrocodone q 6 h prn increase to every 4 hours as needed, increased to 5-10/325 every 4 hours as needed on 9/16  Added oxy CR at noc low dose, monitor for nausea              -prn tylenol             -prn robaxin for spasms  Added tucks QID and lidocaine QID  Gabapentin 100 nightly started on 9/17, increased to 200 on 9/18 with good improvement for neuropathic pain  Will consider weaning medications this week 4. Mood: Provide emotional support. Pt somewhat disheartened by multiple medical setbacks.              -antipsychotic agents: N/A 5. Neuropsych: This patient is capable of making decisions on his own behalf. 6. Skin/Wound Care: Routine skin checks 7. Fluids/Electrolytes/Nutrition: Routine in and outs 8. ID: Continue vancomycin as well as Maxipime through 07/29/2019 per infectious disease  Random level WNL on 9/19 9. Rectal ulcers/H. pylori.  Underwent EGD and flexible sigmoidoscopy 06/24/2019.  Follow-up per GI services.  Received Carafate enema x2 weeks as well as Flagyl x14 days initiated 06/26/2019 and tetracycline x14 days, completed course on 9/17.  -GI following and have discussed with pt re: continuing enemas  -continue to follow hgbs and if any significant bleeding recurs he will need flex sig per Dr. Doyne Keel note  Hemoglobin 9.1 on 9/19  Continue to monitor 10.  Acute on  chronic normocytic anemia.  Patient did receive 2 units packed red blood cells 06/23/2019 and 1 unit PRBC 07/01/2019 and remains on Aranesp.    See #9. 11.  End-stage renal disease.  Continue hemodialysis per renal services.             -HD after therapies to maximize rehab participation 12.  Thoracic aortic dissection.  Status post endovascular stent placement 06/06/2019.  Follow-up per Dr. Oneida Alar 13.  Neurogenic bowel/ bladder. I's and O's reviewed.   -He is essentially anuric.    -We will have patient sit up after administration for bowel movement, nursing to check for impaction  9/22- said doing bowel program daily- will check with nursing. 14.  Hypertension as well as history of first-degree AV block.  Norvasc 10 mg nightly, hydralazine 100 mg every  8 hours, labetalol 300 mg twice daily.  Monitor with increased mobility in therapies.   Spironolactone 25 daily started on 9/13  Losartan 100 daily started on 9/18 per Nephro recs  Remains extremely elevated on 9/20 with hypertensive crisis, however patient missed several doses of medications yesterday.  Will need to be more diligent about administering medications, particularly given #12  Need to have better blood pressure control  9/21- BP 180-190/90s still- will d/w Nephrology  9/22- will add Clonidine #2 patch- not many other choices and recommended by Nephrology. However per chart, clonidine causes dizziness- will speak with pt prior. Vitals:   07/14/19 1945 07/15/19 0359  BP: (!) 176/98 (!) 163/89  Pulse: 75 82  Resp: 17 18  Temp: 98.5 F (36.9 C) 98.6 F (37 C)  SpO2: 96% 98%  15.  Hyperlipidemia.  Lipitor 16.  Tobacco abuse.  Counseling 17.  Thrombocytopenia  Platelets 141 on 9/19  Continue to monitor 18.  Hyperglycemia  Elevated on 9/19, continue to monitor 19. Thrush  9/21- will start Diflucan and dose in evening; and Magic Mouthwash  9/22- says doing better   LOS: 11 days A FACE TO FACE EVALUATION WAS PERFORMED  Shacoya Burkhammer 07/15/2019, 1:01 PM

## 2019-07-15 NOTE — Progress Notes (Signed)
Patient returned from HD around 1830.  Vanc was given during HD.  Notified pharmacy for Cefepime.  Patient also due to have carafate enema.  Patient elected to eat dinner first.  Patient reports the urge to have a BM, assisted to [facility] x 2 assist.  Will administer carafate enema once finished.  Brita Romp, RN

## 2019-07-15 NOTE — Progress Notes (Signed)
Occupational Therapy Session Note  Patient Details  Name: Frank Fuller MRN: [id number] Date of Birth: [birth year]  Today's Date: 07/15/2019 OT Individual Time: [id number] OT Individual Time Calculation (min): 50 min  and Today's Date: 07/15/2019 OT Missed Time: 10 Minutes Missed Time Reason: Patient fatigue;Pain   Short Term Goals: Week 2:  OT Short Term Goal 1 (Week 2): Pt will complete 3/3 toileting tasks on BSC with mod A without use of mechanical lift in prep for d/c home at w/c level OT Short Term Goal 2 (Week 2): Pt will set-up all equipment needed for sliding board transfer with supervision/VC only in prep for transfers at mod I level OT Short Term Goal 3 (Week 2): Pt will don socks/shoes with supervision using AE PRN  Skilled Therapeutic Interventions/Progress Updates:    Treatment session with focus on BLE strengthening and trunk control.  Pt received upright in w/c asleep.  Pt reports fatigue but willing to engage in therapy session.  Pt requesting to address BLE strength.  Engaged in squat pivot transfers w/c <> therapy mat with CGA progressing to supervision when transferring toward w/c when arm rest present.  Engaged in lateral leans and kicking motion from edge mat as well as focus on attempting to obtain figure 4 position to provide additional means for donning/doffing shoes.  Therapist providing assistance and facilitation for figure 4 position to challenge trunk control.  Engaged in 4 reps of 1-2 mins on Kinnetron with focus on stepping motion for BLE strength and control.  Pt reports fatigue and propelled w/c back to room, requesting to rest in w/c prior to PT session.  Therapy Documentation Precautions:  Precautions Precautions: Fall Precaution Comments: paraplegic Restrictions Weight Bearing Restrictions: No General: General OT Amount of Missed Time: 10 Minutes Pain:  Pt with c/o pain buttocks.  Not rated.   Therapy/Group: Individual Therapy  Simonne Come 07/15/2019, 12:31 PM

## 2019-07-15 NOTE — Progress Notes (Addendum)
[facility] Progress Note   Subjective:   Working with PT this am. Denies CP, SOB, N/V. For HD today   Objective Vitals:   07/14/19 1414 07/14/19 1945 07/15/19 0359 07/15/19 0509  BP: (!) 159/118 (!) 176/98 (!) 163/89   Pulse: 82 75 82   Resp: 18 17 18    Temp: 98.5 F (36.9 C) 98.5 F (36.9 C) 98.6 F (37 C)   TempSrc: Oral Oral Oral   SpO2: 99% 96% 98%   Weight:    84.3 kg    Physical Exam General: WNWD male, sitting up in bed.  Heart: RRR Lungs: CTAB  Abdomen: soft NTND Extremities: No LE edema  Dialysis Access: R IJ TDC   Weight change:    Additional Objective Labs: Basic Metabolic Panel: Recent Labs  Lab 07/10/19 1422 07/12/19 1357  NA 134* 135  K 4.5 4.4  CL 96* 97*  CO2 24 25  GLUCOSE 100* 132*  BUN 55* 49*  CREATININE 9.60* 9.17*  CALCIUM 9.1 9.3  PHOS 6.2* 6.1*   CBC: Recent Labs  Lab 07/09/19 0640 07/11/19 0949 07/12/19 1357  WBC 4.1 4.5 4.8  HGB 8.9* 10.4* 9.1*  HCT 28.4* 32.0* 29.6*  MCV 93.4 93.3 95.5  PLT 165 128* 141*   Blood Culture    Component Value Date/Time   SDES BLOOD LEFT HAND 07/03/2019 1644   SPECREQUEST  07/03/2019 1644    BOTTLES DRAWN AEROBIC ONLY Blood Culture adequate volume   CULT  07/03/2019 1644    NO GROWTH 5 DAYS Performed at [facility], [place] [address] [place], [place] [address]    REPTSTATUS 07/08/2019 FINAL 07/03/2019 1644     Medications: . ceFEPime (MAXIPIME) IV 2 g (07/12/19 1819)  . vancomycin Stopped (07/12/19 1648)   . amLODipine  10 mg Oral QHS  . atorvastatin  20 mg Oral q1800  . Chlorhexidine Gluconate Cloth  6 each Topical Q0600  . citalopram  10 mg Oral Daily  . darbepoetin (ARANESP) injection - DIALYSIS  150 mcg Intravenous Q Sat-HD  . feeding supplement (NEPRO CARB STEADY)  237 mL Oral BID BM  . fluconazole  100 mg Oral QHS  . gabapentin  200 mg Oral QHS  . hydrALAZINE  100 mg Oral Q8H  . labetalol  300 mg Oral BID  . lanthanum  1,000 mg Oral TID WC  .  lidocaine  1 application Topical Once  . losartan  100 mg Oral Daily  . magic mouthwash w/lidocaine  10 mL Oral QID  . multivitamin  1 tablet Oral QHS  . oxyCODONE  10 mg Oral QHS  . pantoprazole  40 mg Oral BID  . spironolactone  25 mg Oral Daily  . Sucralfate Enema (Sucralfate 2 g/Sterile Water 60 ml Enema)  2 g Rectal Q12H  . witch hazel-glycerin   Topical QID    Dialysis Orders:  [facility]  4h 8min   400/1.5  87.5kg  2/2.25  P2   R TDC   Hep none - Parsabiv 2.5mg  IV q HD  - Calcitriol 1.26mcg PO q HD  Assessment/Plan: 1. Spinal cord infarct/ paraperesis: per spinal MRI 8/23at T9- T11, following presentation with inability to move his legs. Neuro, NSurg and VVS consulted, all felt etiology was likely spinal artery injury related to aortic dissection procedure.  All recommended supportive care and hope for recovery. Per NSurg, no indication for surgery. Continue treatment with aspirin recommended.  Now admitted to CIR for ongoing physical therapy/Occupational Therapy  along with psychotherapy and pain management. 2. ESRD:Continue HD on TTS schedule - no heparin.  3. RecentType B aortic dissection (s/p stent graft repair 8/14):Goal SBP per VVS 120-170. Known severe resistant HTN. Continue to avoid intradialytic hypotension. On ASA 4. HTN/volume: Blood pressures remain high. Now on amlodipine 10, hydralazine 100 tid, labetalol 300 bid.  Spironolactone 25, losartan 100 added here .  If BP's still uncontrolled next step would try clonidine patch #2 or #3. Pushing UF as tolerated.  5. L4-L5 lumbar discitis: s/p recent aortic stentgraft. Plans noted for intravenous vancomycin and cefepime until 07/29/2019.  Earlier blood cultures negative. 6. Hematochezia- recurrent:GI consulted.Flex sig 9/1 showedstigmata of recent bleedinganddiffusely ulcerated rectum.EGD with fewnon-bleeding erosions in gastric antrum. Rx'd w/ miralax, carafate < 2 weeks. Repeat Flex sig 9/4 withhemostatic  spray to entire rectum.Rx'd Tetracycline + Flagyl for possible H.pylori 7. Anemia(ESRD + ABLA): Hgb 9.1. On Aranesp 150 q Sat. transfusions when hemoglobin <7 g/deciliter. 8. Secondary hyperparathyroidism - Phos elevated. Continue calcitriol. Parsabiv not on hospital formulary.Binders changed to Fosrenol d/t concern that Auryxia irritating his bowels 9. Nutrition: Significant hypoalbuminemia noted likely following acute hospitalization/surgery.  Continue renal diet with ONS. 10. Recent MSSA bacteremia/AVG infection:S/p AVG removal June/ July andAncefcoursecompleted. 11. Depression -following recent sequence of clinical events.   On citalopram.   Lynnda Child PA-C [facility] Pager [phone] 07/15/2019,9:53 AM  LOS: 11 days    Nephrology attending: Patient is undergoing inpatient rehab.  Doing well.  ESRD on HD, dialysis today per TTS schedule.  2K bath, 3 L ultrafiltration.  No issue.  Lawson Radar, MD [facility].

## 2019-07-15 NOTE — Progress Notes (Signed)
Physical Therapy Session Note  Patient Details  Name: Frank Fuller MRN: [id number] Date of Birth: April 03, 1961  Today's Date: 07/15/2019 PT Individual Time: 1100-1200 PT Individual Time Calculation (min): 60 min   Short Term Goals: Week 2:  PT Short Term Goal 1 (Week 2): Pt will perform bed mobility at mod I level PT Short Term Goal 2 (Week 2): Pt will perform least restrictive transfers at Supervision consistently PT Short Term Goal 3 (Week 2): Pt will perform sit to stand with min A  Skilled Therapeutic Interventions/Progress Updates:    Pt received seated in w/c in room, agreeable to PT session. No complaints of pain. Pt agreeable to attempt more standing this date. Manual w/c propulsion x 150 ft with setup A for management of w/c parts. Sit to stand x 3 reps in // bars with min to mod A. Pt relies heavily on use of BUE in standing, intermittent LE buckling in standing. Sit to stand x 5 reps to stedy with mod A to stand from w/c seat height, min A from stedy seat height. Pt demos improved ability to stand with upright posture, hips and trunk extended. Pt continues to exhibit B shoulder elevation in standing. Car transfer via slide board with CGA, v/c for safe transfer technique. Reviewed how to perform w/c pushups and pressure relief schedule. Pt demos good ability to perform w/c pushups, x 10 reps for BUE strengthening. Pt requests to return to bed at end of session. Squat pivot transfer w/c to bed with Supervision. Sit to supine Supervision. Pt left supine in bed with needs in reach, bed alarm in place at end of session.  Therapy Documentation Precautions:  Precautions Precautions: Fall Precaution Comments: paraplegic Restrictions Weight Bearing Restrictions: No    Therapy/Group: Individual Therapy   Excell Seltzer, PT, DPT  07/15/2019, 12:59 PM

## 2019-07-15 NOTE — Plan of Care (Signed)
  Problem: Consults Goal: RH GENERAL PATIENT EDUCATION Description: See Patient Education module for education specifics. Outcome: Progressing   Problem: RH BOWEL ELIMINATION Goal: RH STG MANAGE BOWEL WITH ASSISTANCE Description: STG Manage Bowel with Assistance. mod Outcome: Progressing   Problem: RH SKIN INTEGRITY Goal: RH STG SKIN FREE OF INFECTION/BREAKDOWN Description: Remain free of breakdown with mod assist Outcome: Progressing Goal: RH STG MAINTAIN SKIN INTEGRITY WITH ASSISTANCE Description: STG Maintain Skin Integrity With Assistance. Mod Outcome: Progressing   Problem: RH SAFETY Goal: RH STG ADHERE TO SAFETY PRECAUTIONS W/ASSISTANCE/DEVICE Description: STG Adhere to Safety Precautions With Assistance/Device. mod Outcome: Progressing Goal: RH STG DECREASED RISK OF FALL WITH ASSISTANCE Description: STG Decreased Risk of Fall With Assistance. Adhere to safety precautions, will have no falls w/ mod assist Outcome: Progressing   Problem: RH PAIN MANAGEMENT Goal: RH STG PAIN MANAGED AT OR BELOW PT'S PAIN GOAL Description: Less than 3 Outcome: Progressing   Problem: RH KNOWLEDGE DEFICIT GENERAL Goal: RH STG INCREASE KNOWLEDGE OF SELF CARE AFTER HOSPITALIZATION Description: Patient will be able to describe self care at home following hospitalization with cues/reminders Outcome: Progressing

## 2019-07-15 NOTE — Progress Notes (Signed)
Pharmacy Antibiotic Note  Frank Fuller is a 58 y.o. male admitted on 07/04/2019.  Pharmacy has been consulted for Vanco/Cefepime dosing for vertebral osteomyelitis/discitis until 10/6 per ID recommendations. WBC is wnl. Patient remains afebrile.  Last Pre-HD vancomycin level 9/19 therapeutic at 24 (goal 15-25 mcg/mL) on current dose of Vancomycin. ESRD - Patient remains on TTS HD schedule - next HD scheduled for today.   8/23 BCx >> neg 8/25 IR disc aspirate >> neg 9/10 BCx - neg  Plan: Vancomycin 1g IV qHD TTS Cefepime 2g IV qTTS at 1800 Monitor clinical progress, c/s, abx plan/LOT Pre-HD vancomycin levels weekly (last 9/19) Monitor HD schedule/tolerance inpatient LOT - 6 weeks per ID   Weight: 185 lb 13.6 oz (84.3 kg)  Temp (24hrs), Avg:98.5 F (36.9 C), Min:98.5 F (36.9 C), Max:98.6 F (37 C)  Recent Labs  Lab 07/09/19 0640 07/10/19 1422 07/11/19 0949 07/12/19 1145 07/12/19 1357  WBC 4.1  --  4.5  --  4.8  CREATININE  --  9.60*  --   --  9.17*  VANCORANDOM  --   --   --  24  --     Estimated Creatinine Clearance: 10.2 mL/min (A) (by C-G formula based on SCr of 9.17 mg/dL (H)).    Allergies  Allergen Reactions  . Oxycodone Nausea Only  . Pramoxine     Other reaction(s): Skin Rash  . Chlorhexidine Other (See Comments)    Unknown reaction Patch skin test done at dialysis 06/26/17  - staff using clear dressing and alcohol to clean exit site of catheter  . Clonidine Derivatives Other (See Comments)    Dizziness   . Fluorouracil     Other reaction(s): Skin Rash  . Lisinopril Other (See Comments)    unresponsive  . Sensipar  [Cinacalcet Hcl]     Other reaction(s): agitation  . Carvedilol Rash   Elicia Lamp, PharmD, BCPS Please check AMION for all [facility] contact numbers Clinical Pharmacist 07/15/2019 10:20 AM

## 2019-07-16 ENCOUNTER — Inpatient Hospital Stay (HOSPITAL_COMMUNITY): Payer: Medicare Other | Admitting: Occupational Therapy

## 2019-07-16 ENCOUNTER — Inpatient Hospital Stay (HOSPITAL_COMMUNITY): Payer: Medicare Other | Admitting: Physical Therapy

## 2019-07-16 LAB — CBC WITH DIFFERENTIAL/PLATELET
Abs Immature Granulocytes: 0.01 10*3/uL (ref 0.00–0.07)
Basophils Absolute: 0 10*3/uL (ref 0.0–0.1)
Basophils Relative: 0 %
Eosinophils Absolute: 0.1 10*3/uL (ref 0.0–0.5)
Eosinophils Relative: 3 %
HCT: 30.7 % — ABNORMAL LOW (ref 39.0–52.0)
Hemoglobin: 9.6 g/dL — ABNORMAL LOW (ref 13.0–17.0)
Immature Granulocytes: 0 %
Lymphocytes Relative: 20 %
Lymphs Abs: 0.9 10*3/uL (ref 0.7–4.0)
MCH: 29.6 pg (ref 26.0–34.0)
MCHC: 31.3 g/dL (ref 30.0–36.0)
MCV: 94.8 fL (ref 80.0–100.0)
Monocytes Absolute: 0.8 10*3/uL (ref 0.1–1.0)
Monocytes Relative: 20 %
Neutro Abs: 2.4 10*3/uL (ref 1.7–7.7)
Neutrophils Relative %: 57 %
Platelets: 145 10*3/uL — ABNORMAL LOW (ref 150–400)
RBC: 3.24 MIL/uL — ABNORMAL LOW (ref 4.22–5.81)
RDW: 17.6 % — ABNORMAL HIGH (ref 11.5–15.5)
WBC: 4.3 10*3/uL (ref 4.0–10.5)
nRBC: 0 % (ref 0.0–0.2)

## 2019-07-16 MED ORDER — PREGABALIN 25 MG PO CAPS
25.0000 mg | ORAL_CAPSULE | Freq: Two times a day (BID) | ORAL | Status: DC
  Administered 2019-07-16 – 2019-07-18 (×5): 25 mg via ORAL
  Filled 2019-07-16 (×5): qty 1

## 2019-07-16 MED FILL — Sucralfate Susp 1 GM/10ML: ORAL | Qty: 20 | Status: AC

## 2019-07-16 MED FILL — Water For Irrigation, Sterile Irrigation Soln: Qty: 250 | Status: AC

## 2019-07-16 MED FILL — Water For Irrigation, Sterile Irrigation Soln: Qty: 40 | Status: AC

## 2019-07-16 NOTE — Progress Notes (Signed)
Occupational Therapy Note  Patient Details  Name: Frank Fuller MRN: [id number] Date of Birth: [birth year]  Today's Date: 07/16/2019 OT Missed Time: 91 Minutes Missed Time Reason: Pain  Upon entering the room, pt keeping eyes closed and declining OT intervention secondary to pain. OT will attempt again at next available time.     Gypsy Decant 07/16/2019, 3:22 PM

## 2019-07-16 NOTE — Progress Notes (Addendum)
[facility] Progress Note   Subjective:    Seen in room. Reports bleeding per rectum this am with lower abd pain (recurrent issue) . GI aware Completed HD yesterday with net UF 3L   Objective Vitals:   07/15/19 1807 07/15/19 1810 07/15/19 1930 07/16/19 0617  BP: (!) 171/88 (!) 176/106 (!) 158/87 (!) 176/99  Pulse: 82 79 68 80  Resp:  18 18 18   Temp:  98.8 F (37.1 C) 98.9 F (37.2 C) 99.7 F (37.6 C)  TempSrc:  Oral Oral Oral  SpO2:  96% 99% 100%  Weight:  83 kg      Physical Exam General: WNWD male, sitting up in bed.  Heart: RRR Lungs: CTAB  Abdomen: soft NTND Extremities: No LE edema  Dialysis Access: R IJ TDC   Weight change: 1.7 kg   Additional Objective Labs: Basic Metabolic Panel: Recent Labs  Lab 07/10/19 1422 07/12/19 1357 07/15/19 1411  NA 134* 135 135  K 4.5 4.4 5.2*  CL 96* 97* 94*  CO2 24 25 22   GLUCOSE 100* 132* 121*  BUN 55* 49* 70*  CREATININE 9.60* 9.17* 12.10*  CALCIUM 9.1 9.3 9.4  PHOS 6.2* 6.1* 5.8*   CBC: Recent Labs  Lab 07/11/19 0949 07/12/19 1357 07/15/19 1411 07/16/19 0956  WBC 4.5 4.8 4.4 4.3  NEUTROABS  --   --   --  2.4  HGB 10.4* 9.1* 9.0* 9.6*  HCT 32.0* 29.6* 29.2* 30.7*  MCV 93.3 95.5 96.4 94.8  PLT 128* 141* 150 145*   Blood Culture    Component Value Date/Time   SDES BLOOD LEFT HAND 07/03/2019 1644   SPECREQUEST  07/03/2019 1644    BOTTLES DRAWN AEROBIC ONLY Blood Culture adequate volume   CULT  07/03/2019 1644    NO GROWTH 5 DAYS Performed at [facility], [place] [address], [place], [place] [address]    REPTSTATUS 07/08/2019 FINAL 07/03/2019 1644     Medications: . ceFEPime (MAXIPIME) IV Stopped (07/15/19 2212)  . vancomycin Stopped (07/15/19 1828)   . amLODipine  10 mg Oral QHS  . atorvastatin  20 mg Oral q1800  . citalopram  10 mg Oral Daily  . darbepoetin (ARANESP) injection - DIALYSIS  150 mcg Intravenous Q Sat-HD  . feeding supplement (NEPRO CARB STEADY)  237 mL Oral BID  BM  . fluconazole  100 mg Oral QHS  . gabapentin  200 mg Oral QHS  . hydrALAZINE  100 mg Oral Q8H  . labetalol  300 mg Oral BID  . lanthanum  1,000 mg Oral TID WC  . lidocaine  1 application Topical Once  . losartan  100 mg Oral Daily  . magic mouthwash w/lidocaine  10 mL Oral QID  . multivitamin  1 tablet Oral QHS  . oxyCODONE  10 mg Oral QHS  . pantoprazole  40 mg Oral BID  . pregabalin  25 mg Oral BID  . spironolactone  25 mg Oral Daily  . Sucralfate Enema (Sucralfate 2 g/Sterile Water 60 ml Enema)  2 g Rectal Q12H  . witch hazel-glycerin   Topical QID    Dialysis Orders:  [facility]  4h 56min   400/1.5  87.5kg  2/2.25  P2   R TDC   Hep none - Parsabiv 2.5mg  IV q HD  - Calcitriol 1.58mcg PO q HD  Assessment/Plan: 1. Spinal cord infarct/ paraperesis: per spinal MRI 8/23at T9- T11, following presentation with inability to move his legs. Neuro, NSurg and VVS consulted,  all felt etiology was likely spinal artery injury related to aortic dissection procedure.  All recommended supportive care and hope for recovery. Per NSurg, no indication for surgery. Continue treatment with aspirin recommended.  Now admitted to CIR for ongoing physical therapy/Occupational Therapy along with psychotherapy and pain management. 2. ESRD:Continue HD on TTS schedule - no heparin.  3. RecentType B aortic dissection (s/p stent graft repair 8/14):Goal SBP per VVS 120-170. Known severe resistant HTN. Continue to avoid intradialytic hypotension. On ASA 4. HTN/volume: Blood pressures remain high. Now on amlodipine 10, hydralazine 100 tid, labetalol 300 bid.  Spironolactone 25, losartan 100 added here .  If BP's still uncontrolled next step would try clonidine patch #2 or #3. Pushing UF as tolerated.  5. L4-L5 lumbar discitis: s/p recent aortic stentgraft. Plans noted for intravenous vancomycin and cefepime until 07/29/2019.  Earlier blood cultures negative. 6. Hematochezia- recurrent:GI consulted.Flex sig  9/1 showedstigmata of recent bleedinganddiffusely ulcerated rectum.EGD with fewnon-bleeding erosions in gastric antrum. Rx'd w/ miralax, carafate < 2 weeks. Repeat Flex sig 9/4 withhemostatic spray to entire rectum.Rx'd Tetracycline + Flagyl for possible H.pylori. New bleeding event 9/23. GI following  7. Anemia(ESRD + ABLA): Hgb 9.6. On Aranesp 150 q Sat. Transfuse when hemoglobin <7 8. Secondary hyperparathyroidism - Phos elevated. Continue calcitriol. Parsabiv not on hospital formulary.Binders changed to Fosrenol d/t concern that Auryxia irritating his bowels 9. Nutrition: Significant hypoalbuminemia noted likely following acute hospitalization/surgery.  Continue renal diet with ONS. 10. Recent MSSA bacteremia/AVG infection:S/p AVG removal June/ July andAncefcoursecompleted. 11. Depression -following recent sequence of clinical events.   On citalopram.   Lynnda Child PA-C [facility] Pager [phone] 07/16/2019,11:51 AM  LOS: 12 days    Nephrology attending: Patient was seen and examined at bedside.  Chart reviewed.  I agree with assessment and plan as outlined above. Status post dialysis yesterday with 3 L ultrafiltration, tolerated well.  Today patient is complaining of bleeding with bowel movement.  GI was contacted and following.  May need repeat sigmoidoscopy.  Blood pressure acceptable.  Doing inpatient rehab.  Plan for next dialysis tomorrow.  Frank Radar, MD [facility].

## 2019-07-16 NOTE — Progress Notes (Signed)
Occupational Therapy Note  Patient Details  Name: Frank Fuller MRN: [id number] Date of Birth: 05/13/1961  Today's Date: 07/16/2019 OT Missed Time: 53 Minutes and 75 Minutes Missed Time Reason: Patient fatigue;Pain  Pt missed 45 mins followed by 75 mins of scheduled therapy due to pain.  Pt reports increased pain in rectum and having blood in stool this AM.  Medical team aware.  Encouraged pt to engage in bed mobility at very least to prevent skin breakdown, however pt reports unable to get comfortable in any other position.    Therapist returned during later scheduled session with pt continuing to refuse any therapy even at bed level.  Pt remained in supine, despite encouragement to roll in to sidelying.  Continue to follow per POC.  Simonne Come 07/16/2019, 1:35 PM

## 2019-07-16 NOTE — Progress Notes (Signed)
[place] PHYSICAL MEDICINE & REHABILITATION PROGRESS NOTE   Subjective/Complaints:   Pt reports enema last night only got "a pool of blood" out from enema- "blood was everywhere".  Rectal pain is so bad, can't stand it, per pt- worried about not being able to do therapy this AM- hasn't gotten pain meds as of yet.  ROS: Denies CP, SOB, N/V/D  Objective:   No results found. Recent Labs    07/15/19 1411  WBC 4.4  HGB 9.0*  HCT 29.2*  PLT 150   Recent Labs    07/15/19 1411  NA 135  K 5.2*  CL 94*  CO2 22  GLUCOSE 121*  BUN 70*  CREATININE 12.10*  CALCIUM 9.4    Intake/Output Summary (Last 24 hours) at 07/16/2019 0904 Last data filed at 07/16/2019 0802 Gross per 24 hour  Intake 1222 ml  Output 3000 ml  Net -1778 ml     Physical Exam: Vital Signs Blood pressure (!) 176/99, pulse 80, temperature 99.7 F (37.6 C), temperature source Oral, resp. rate 18, weight 83 kg, SpO2 100 %. Constitutional: No distress . Vital signs reviewed. Awake, alert, appropriate, sitting up in bed,when moved over just a little in bed; moaned in pain and wincing for tiny movements, NAD HENT: Normocephalic.  Atraumatic. Eyes: EOMI. No discharge. Cardiovascular: RRR Respiratory: CTA B/L GI: Non-distended. Skin: Warm and dry.  Intact. Psych: appropriate but flat affect Musc: No edema in extremities.  No tenderness in extremities. Neurological: Alert Motor: Bilateral upper extremities: 5/5 proximal distal, unchanged Right lower extremity: Hip flexion, knee extension 2/5, ankle dorsiflexion 4/5, unchanged Left lower extremity: Hip flexion 2-/5, knee extension 2+/5, ankle dorsiflexion 4/5, unchanged  Assessment/Plan: 1. 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  Physiatrist is providing close team supervision and 24 hour management of active medical problems  listed below.  Physiatrist and rehab team continue to assess barriers to discharge/monitor patient progress toward functional and medical goals  Care Tool:  Bathing  Bathing activity did not occur: Refused Body parts bathed by patient: Right arm, Left arm, Chest, Abdomen, Front perineal area, Right upper leg, Left upper leg, Right lower leg, Left lower leg, Face, Buttocks   Body parts bathed by helper: Buttocks     Bathing assist Assist Level: Supervision/Verbal cueing(Bed level)     Upper Body Dressing/Undressing Upper body dressing   What is the patient wearing?: Pull over shirt    Upper body assist Assist Level: Set up assist    Lower Body Dressing/Undressing Lower body dressing      What is the patient wearing?: Pants, Incontinence brief     Lower body assist Assist for lower body dressing: Minimal Assistance - Patient > 75%(Bed level)     Toileting Toileting    Toileting assist Assist for toileting: Total Assistance - Patient < 25%     Transfers Chair/bed transfer  Transfers assist     Chair/bed transfer assist level: Contact Guard/Touching assist     Locomotion Ambulation   Ambulation assist   Ambulation activity did not occur: Safety/medical concerns          Walk 10 feet activity   Assist  Walk 10 feet activity did not occur: Safety/medical concerns        Walk 50 feet activity   Assist Walk 50 feet with 2 turns activity did not occur: Safety/medical concerns  Walk 150 feet activity   Assist Walk 150 feet activity did not occur: Safety/medical concerns         Walk 10 feet on uneven surface  activity   Assist Walk 10 feet on uneven surfaces activity did not occur: Safety/medical concerns         Wheelchair     Assist Will patient use wheelchair at discharge?: Yes Type of Wheelchair: Manual    Wheelchair assist level: Set up assist Max wheelchair distance: 150'    Wheelchair 50 feet with 2 turns  activity    Assist        Assist Level: Set up assist   Wheelchair 150 feet activity     Assist  Wheelchair 150 feet activity did not occur: Safety/medical concerns   Assist Level: Set up assist   Blood pressure (!) 176/99, pulse 80, temperature 99.7 F (37.6 C), temperature source Oral, resp. rate 18, weight 83 kg, SpO2 100 %.  Medical Problem List and Plan: 1.  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   Continue CIR 2.  Antithrombotics:  -DVT/anticoagulation: SCDs.  LE vascular study limited, but negative for DVT.             -antiplatelet therapy: Low-dose aspirin 81 mg daily 3. Pain Management:   Hydrocodone q 6 h prn increase to every 4 hours as needed, increased to 5-10/325 every 4 hours as needed on 9/16  Added oxy CR at noc low dose, monitor for nausea              -prn tylenol             -prn robaxin for spasms  Added tucks QID and lidocaine QID  Gabapentin 100 nightly started on 9/17, increased to 200 on 9/18 with good improvement for neuropathic pain  9/23- will try lyrica- 25 mg BID- gabapentin making him sleepy- will wean Gabapentin once on higher dose of Lyrica.  4. Mood: Provide emotional support. Pt somewhat disheartened by multiple medical setbacks.              -antipsychotic agents: N/A 5. Neuropsych: This patient is capable of making decisions on his own behalf. 6. Skin/Wound Care: Routine skin checks 7. Fluids/Electrolytes/Nutrition: Routine in and outs 8. ID: Continue vancomycin as well as Maxipime through 07/29/2019 per infectious disease  Random level WNL on 9/19 9. Rectal ulcers/H. pylori.  Underwent EGD and flexible sigmoidoscopy 06/24/2019.  Follow-up per GI services.  Received Carafate enema x2 weeks as well as Flagyl x14 days initiated 06/26/2019 and tetracycline x14 days, completed course on 9/17.  -GI following and have discussed with  pt re: continuing enemas  -continue to follow hgbs and if any significant bleeding recurs he will need flex sig per Dr. Doyne Keel note  Hemoglobin 9.1 on 9/19  9/23- ordered labs for now and tomorrow- pt reports "pool of blood" from carafate enema last night.   Continue to monitor 10.  Acute on chronic normocytic anemia.  Patient did receive 2 units packed red blood cells 06/23/2019 and 1 unit PRBC 07/01/2019 and remains on Aranesp.    See #9. 11.  End-stage renal disease.  Continue hemodialysis per renal services.             -HD after therapies to maximize rehab participation 12.  Thoracic aortic dissection.  Status post endovascular stent placement 06/06/2019.  Follow-up per Dr. Oneida Alar 13.  Neurogenic bowel/ bladder. I's and  O's reviewed.   -He is essentially anuric.    -We will have patient sit up after administration for bowel movement, nursing to check for impaction  9/22- said doing bowel program daily- will check with nursing. 14.  Hypertension as well as history of first-degree AV block.  Norvasc 10 mg nightly, hydralazine 100 mg every 8 hours, labetalol 300 mg twice daily.  Monitor with increased mobility in therapies.   Spironolactone 25 daily started on 9/13  Losartan 100 daily started on 9/18 per Nephro recs  Remains extremely elevated on 9/20 with hypertensive crisis, however patient missed several doses of medications yesterday.  Will need to be more diligent about administering medications, particularly given #12  Need to have better blood pressure control  9/21- BP 180-190/90s still- will d/w Nephrology  9/22- will add Clonidine #2 patch- not many other choices and recommended by Nephrology. However per chart, clonidine causes dizziness- will speak with pt prior.  9/23- per pt, CANNOT tolerate Clonidine- will d/w Nephrology. Vitals:   07/15/19 1930 07/16/19 0617  BP: (!) 158/87 (!) 176/99  Pulse: 68 80  Resp: 18 18  Temp: 98.9 F (37.2 C) 99.7 F (37.6 C)  SpO2: 99% 100%   15.  Hyperlipidemia.  Lipitor 16.  Tobacco abuse.  Counseling 17.  Thrombocytopenia  Platelets 141 on 9/19  Continue to monitor 18.  Hyperglycemia  Elevated on 9/19, continue to monitor 19. Thrush  9/21- will start Diflucan and dose in evening; and Magic Mouthwash  9/22- says doing better   LOS: 12 days A FACE TO FACE EVALUATION WAS PERFORMED  Lisseth Brazeau 07/16/2019, 9:04 AM

## 2019-07-16 NOTE — Progress Notes (Signed)
Pt refused sucralfate enema. RN educated pt on importance of maintaining regular bowel pattern. Pt stated, "only blood came out from last enema." RN will discuss with oncoming nurse. RN will continue to monitor and educate.

## 2019-07-16 NOTE — Care Plan (Signed)
Pt's plan of care adjusted to 15/7 after speaking with care team and discussed with MD in team conference as pt currently unable to tolerate current therapy schedule with OT and PT. 

## 2019-07-16 NOTE — Progress Notes (Signed)
Notified by [facility] rehab PA of recurrent hematochezia in Frank Fuller.   Occurred this AM associated with administration of Carafate enema Hemodynamically stable, actually hypertensive and HR in 80s.    Known stercoral ulcers, dx established at 9/1 flex sig for hematochezia. 9/4 Flex sig with hemospray application for hematochezia.    Advised PA to hold pt's Carafate enema today, resume tmrw and if pt becomes unstable or repeat Hgb is markedly worse, to recontact GI and we could consider repeat flex sig or nuc bleeding study vs CTAP/angiography/embolization.    Will check back on pt's chart tmrw.    Thank you  Azucena Freed PA-c [phone].   Pager.

## 2019-07-16 NOTE — Progress Notes (Signed)
Physical Therapy Note  Patient Details  Name: Frank Fuller MRN: [id number] Date of Birth: 02/23/1961 Today's Date: 07/16/2019    Attempted to see patient for scheduled therapy session. Pt reports significant pain in rectum this AM as well as having blood in his stool. Medical team aware. Pt declines any participation this AM 2/2 pain. Will follow up per POC. Pt missed 45 min of scheduled skilled therapy services.   Excell Seltzer, PT, DPT  07/16/2019, 12:44 PM

## 2019-07-17 ENCOUNTER — Inpatient Hospital Stay (HOSPITAL_COMMUNITY): Payer: Medicare Other | Admitting: Occupational Therapy

## 2019-07-17 ENCOUNTER — Inpatient Hospital Stay (HOSPITAL_COMMUNITY): Payer: Medicare Other | Admitting: *Deleted

## 2019-07-17 ENCOUNTER — Inpatient Hospital Stay (HOSPITAL_COMMUNITY): Payer: Medicare Other | Admitting: Physical Therapy

## 2019-07-17 LAB — CBC WITH DIFFERENTIAL/PLATELET
Abs Immature Granulocytes: 0.03 10*3/uL (ref 0.00–0.07)
Basophils Absolute: 0 10*3/uL (ref 0.0–0.1)
Basophils Relative: 0 %
Eosinophils Absolute: 0.1 10*3/uL (ref 0.0–0.5)
Eosinophils Relative: 3 %
HCT: 29.6 % — ABNORMAL LOW (ref 39.0–52.0)
Hemoglobin: 9.5 g/dL — ABNORMAL LOW (ref 13.0–17.0)
Immature Granulocytes: 1 %
Lymphocytes Relative: 22 %
Lymphs Abs: 1.1 10*3/uL (ref 0.7–4.0)
MCH: 30.1 pg (ref 26.0–34.0)
MCHC: 32.1 g/dL (ref 30.0–36.0)
MCV: 93.7 fL (ref 80.0–100.0)
Monocytes Absolute: 0.9 10*3/uL (ref 0.1–1.0)
Monocytes Relative: 19 %
Neutro Abs: 2.7 10*3/uL (ref 1.7–7.7)
Neutrophils Relative %: 55 %
Platelets: 149 10*3/uL — ABNORMAL LOW (ref 150–400)
RBC: 3.16 MIL/uL — ABNORMAL LOW (ref 4.22–5.81)
RDW: 17.7 % — ABNORMAL HIGH (ref 11.5–15.5)
WBC: 4.9 10*3/uL (ref 4.0–10.5)
nRBC: 0 % (ref 0.0–0.2)

## 2019-07-17 LAB — COMPREHENSIVE METABOLIC PANEL
ALT: 10 U/L (ref 0–44)
AST: 17 U/L (ref 15–41)
Albumin: 2.1 g/dL — ABNORMAL LOW (ref 3.5–5.0)
Alkaline Phosphatase: 69 U/L (ref 38–126)
Anion gap: 13 (ref 5–15)
BUN: 41 mg/dL — ABNORMAL HIGH (ref 6–20)
CO2: 25 mmol/L (ref 22–32)
Calcium: 9.3 mg/dL (ref 8.9–10.3)
Chloride: 96 mmol/L — ABNORMAL LOW (ref 98–111)
Creatinine, Ser: 9.2 mg/dL — ABNORMAL HIGH (ref 0.61–1.24)
GFR calc Af Amer: 6 mL/min — ABNORMAL LOW (ref >60)
GFR calc non Af Amer: 6 mL/min — ABNORMAL LOW (ref >60)
Glucose, Bld: 90 mg/dL (ref 70–99)
Potassium: 5.1 mmol/L (ref 3.5–5.1)
Sodium: 134 mmol/L — ABNORMAL LOW (ref 135–145)
Total Bilirubin: 0.2 mg/dL — ABNORMAL LOW (ref 0.3–1.2)
Total Protein: 6.1 g/dL — ABNORMAL LOW (ref 6.5–8.1)

## 2019-07-17 MED ORDER — SODIUM CHLORIDE 0.9 % IV SOLN: 100.0000 mL | INTRAVENOUS | Status: DC | PRN

## 2019-07-17 MED ORDER — ALTEPLASE 2 MG IJ SOLR
2.0000 mg | Freq: Once | INTRAMUSCULAR | Status: DC | PRN
  Filled 2019-07-17: qty 2

## 2019-07-17 MED ORDER — PENTAFLUOROPROP-TETRAFLUOROETH EX AERO: 1.0000 "application " | INHALATION_SPRAY | CUTANEOUS | Status: DC | PRN

## 2019-07-17 MED ORDER — HEPARIN SODIUM (PORCINE) 1000 UNIT/ML DIALYSIS
1000.0000 [IU] | INTRAMUSCULAR | Status: DC | PRN
  Filled 2019-07-17: qty 1

## 2019-07-17 MED ORDER — HEPARIN SODIUM (PORCINE) 1000 UNIT/ML IJ SOLN
INTRAMUSCULAR | Status: AC
  Filled 2019-07-17: qty 4

## 2019-07-17 MED ORDER — LIDOCAINE-PRILOCAINE 2.5-2.5 % EX CREA
1.0000 "application " | TOPICAL_CREAM | CUTANEOUS | Status: DC | PRN
  Filled 2019-07-17: qty 5

## 2019-07-17 NOTE — Progress Notes (Addendum)
[facility] Progress Note   Subjective:    Seen in room. Feels much better today. No further bleeding episodes.  Denies CP, SOB, N/V/D.  For HD today   Objective Vitals:   07/16/19 0617 07/16/19 1428 07/16/19 1954 07/17/19 0533  BP: (!) 176/99 (!) 164/101 (!) 165/95 (!) 167/95  Pulse: 80 80 76 79  Resp: 18 17 16 18   Temp: 99.7 F (37.6 C) 98.5 F (36.9 C) 98.6 F (37 C) 99.6 F (37.6 C)  TempSrc: Oral Oral Oral Oral  SpO2: 100% 99% 97% 97%  Weight:        Physical Exam General: WNWD male, sitting up in wheelchair NAD  Heart: RRR Lungs: CTAB  Abdomen: soft NTND Extremities: No LE edema  Dialysis Access: R IJ TDC   Weight change:    Additional Objective Labs: Basic Metabolic Panel: Recent Labs  Lab 07/10/19 1422 07/12/19 1357 07/15/19 1411 07/17/19 0606  NA 134* 135 135 134*  K 4.5 4.4 5.2* 5.1  CL 96* 97* 94* 96*  CO2 24 25 22 25   GLUCOSE 100* 132* 121* 90  BUN 55* 49* 70* 41*  CREATININE 9.60* 9.17* 12.10* 9.20*  CALCIUM 9.1 9.3 9.4 9.3  PHOS 6.2* 6.1* 5.8*  --    CBC: Recent Labs  Lab 07/11/19 0949 07/12/19 1357 07/15/19 1411 07/16/19 0956 07/17/19 0606  WBC 4.5 4.8 4.4 4.3 4.9  NEUTROABS  --   --   --  2.4 2.7  HGB 10.4* 9.1* 9.0* 9.6* 9.5*  HCT 32.0* 29.6* 29.2* 30.7* 29.6*  MCV 93.3 95.5 96.4 94.8 93.7  PLT 128* 141* 150 145* 149*   Blood Culture    Component Value Date/Time   SDES BLOOD LEFT HAND 07/03/2019 1644   SPECREQUEST  07/03/2019 1644    BOTTLES DRAWN AEROBIC ONLY Blood Culture adequate volume   CULT  07/03/2019 1644    NO GROWTH 5 DAYS Performed at [facility], [place] [address], [place], [place] [address]    REPTSTATUS 07/08/2019 FINAL 07/03/2019 1644     Medications: . ceFEPime (MAXIPIME) IV Stopped (07/15/19 2212)  . vancomycin Stopped (07/15/19 1828)   . amLODipine  10 mg Oral QHS  . atorvastatin  20 mg Oral q1800  . citalopram  10 mg Oral Daily  . darbepoetin (ARANESP) injection - DIALYSIS   150 mcg Intravenous Q Sat-HD  . feeding supplement (NEPRO CARB STEADY)  237 mL Oral BID BM  . fluconazole  100 mg Oral QHS  . gabapentin  200 mg Oral QHS  . hydrALAZINE  100 mg Oral Q8H  . labetalol  300 mg Oral BID  . lanthanum  1,000 mg Oral TID WC  . lidocaine  1 application Topical Once  . losartan  100 mg Oral Daily  . magic mouthwash w/lidocaine  10 mL Oral QID  . multivitamin  1 tablet Oral QHS  . oxyCODONE  10 mg Oral QHS  . pantoprazole  40 mg Oral BID  . pregabalin  25 mg Oral BID  . spironolactone  25 mg Oral Daily  . Sucralfate Enema (Sucralfate 2 g/Sterile Water 60 ml Enema)  2 g Rectal Q12H  . witch hazel-glycerin   Topical QID    Dialysis Orders:  [facility]  4h 31min   400/1.5  87.5kg  2/2.25  P2   R TDC   Hep none - Parsabiv 2.5mg  IV q HD  - Calcitriol 1.66mcg PO q HD  Assessment/Plan: 1. Spinal cord infarct/ paraperesis: per  spinal MRI 8/23at T9- T11, following presentation with inability to move his legs. Neuro, NSurg and VVS consulted, all felt etiology was likely spinal artery injury related to aortic dissection procedure.  All recommended supportive care and hope for recovery. Per NSurg, no indication for surgery. Continue treatment with aspirin recommended.  Now admitted to CIR for ongoing physical therapy/Occupational Therapy along with psychotherapy and pain management. 2. ESRD:Continue HD on TTS schedule - no heparin.  3. RecentType B aortic dissection (s/p stent graft repair 8/14):Goal SBP per VVS 120-170. Known severe resistant HTN. Continue to avoid intradialytic hypotension. On ASA 4. HTN/volume: Blood pressures remain high. Now on amlodipine 10, hydralazine 100 tid, labetalol 300 bid.  Spironolactone 25, losartan 100 added here .  If BP's still uncontrolled next step would try clonidine patch #2 or #3. Pushing UF as tolerated.  5. L4-L5 lumbar discitis: s/p recent aortic stentgraft. Plans noted for intravenous vancomycin and cefepime until  07/29/2019.  Earlier blood cultures negative. 6. Hematochezia- recurrent:GI consulted.Flex sig 9/1 showedstigmata of recent bleedinganddiffusely ulcerated rectum.EGD with fewnon-bleeding erosions in gastric antrum. Rx'd w/ miralax, carafate < 2 weeks. Repeat Flex sig 9/4 withhemostatic spray to entire rectum.Rx'd Tetracycline + Flagyl for possible H.pylori. New bleeding event 9/23. GI following  7. Anemia(ESRD + ABLA): Hgb 9.6. On Aranesp 150 q Sat. Transfuse when hemoglobin <7 8. Secondary hyperparathyroidism - Phos elevated. Continue calcitriol. Parsabiv not on hospital formulary.Binders changed to Fosrenol d/t concern that Auryxia irritating his bowels 9. Nutrition: Significant hypoalbuminemia noted likely following acute hospitalization/surgery.  Continue renal diet with ONS. 10. Recent MSSA bacteremia/AVG infection:S/p AVG removal June/ July andAncefcoursecompleted. 11. Depression -following recent sequence of clinical events.   On citalopram.   Lynnda Child PA-C [facility] Pager [phone] 07/17/2019,10:15 AM  LOS: 13 days   Nephrology attending: Patient was seen and examined at bedside.  Chart reviewed.  I agree with assessment and plan as outlined above. No further GI bleeding today.  Potassium 5.1.  Plan for regular dialysis today.  He has been doing inpatient rehab.  No new event.  Lawson Radar, MD [facility].

## 2019-07-17 NOTE — Progress Notes (Signed)
Frank Fuller   Subjective/Complaints:   Frank Fuller reports doing much better today- thinks will go to Frank Fuller and OT today- is at least going to try to participate.  Thinks nerve pain meds starting yesterday was helpful.   ROS: Denies CP, SOB, N/V/D  Objective:   No results found. Recent Labs    07/16/19 0956 07/17/19 0606  WBC 4.3 4.9  HGB 9.6* 9.5*  HCT 30.7* 29.6*  PLT 145* 149*   Recent Labs    07/15/19 1411 07/17/19 0606  NA 135 134*  K 5.2* 5.1  CL 94* 96*  CO2 22 25  GLUCOSE 121* 90  BUN 70* 41*  CREATININE 12.10* 9.20*  CALCIUM 9.4 9.3    Intake/Output Summary (Last 24 hours) at 07/17/2019 0852 Last data filed at 07/17/2019 0700 Gross per 24 hour  Intake 670 ml  Output -  Net 670 ml     Physical Exam: Vital Signs Blood pressure (!) 167/95, pulse 79, temperature 99.6 F (37.6 C), temperature source Oral, resp. rate 18, weight 83 kg, SpO2 97 %. Constitutional: No distress . Vital signs reviewed. Awake, alert, appropriate, sitting up in bed; 2 NTs in room, helping Frank Fuller get dressed; much brighter affect, NAD HENT: Normocephalic.  Atraumatic. Eyes: EOMI. No discharge. Cardiovascular: RRR Respiratory: CTA B/L GI: Non-distended. Skin: Warm and dry.  Intact. Psych: appropriate and brighter affect; actually smiled Musc: No edema in extremities.  No tenderness in extremities. Neurological: Alert Motor: Bilateral upper extremities: 5/5 proximal distal, unchanged Right lower extremity: Hip flexion, knee extension 2/5, ankle dorsiflexion 4/5, unchanged Left lower extremity: Hip flexion 2-/5, knee extension 2+/5, ankle dorsiflexion 4/5, unchanged  Assessment/Plan: 1. 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  Physiatrist is providing close team supervision and 24 hour management of active  medical problems listed below.  Physiatrist and rehab team continue to assess barriers to discharge/monitor patient progress toward functional and medical goals  Care Tool:  Bathing  Bathing activity did not occur: Refused Body parts bathed by patient: Right arm, Left arm, Chest, Abdomen, Front perineal area, Right upper leg, Left upper leg, Right lower leg, Left lower leg, Face, Buttocks   Body parts bathed by helper: Buttocks     Bathing assist Assist Level: Supervision/Verbal cueing(Bed level)     Upper Body Dressing/Undressing Upper body dressing   What is the patient wearing?: Pull over shirt    Upper body assist Assist Level: Set up assist    Lower Body Dressing/Undressing Lower body dressing      What is the patient wearing?: Pants, Incontinence brief     Lower body assist Assist for lower body dressing: Minimal Assistance - Patient > 75%(Bed level)     Toileting Toileting    Toileting assist Assist for toileting: Total Assistance - Patient < 25%     Transfers Chair/bed transfer  Transfers assist     Chair/bed transfer assist level: Contact Guard/Touching assist     Locomotion Ambulation   Ambulation assist   Ambulation activity did not occur: Safety/medical concerns          Walk 10 feet activity   Assist  Walk 10 feet activity did not occur: Safety/medical concerns        Walk 50 feet activity   Assist Walk 50 feet with 2 turns activity did not occur: Safety/medical concerns  Walk 150 feet activity   Assist Walk 150 feet activity did not occur: Safety/medical concerns         Walk 10 feet on uneven surface  activity   Assist Walk 10 feet on uneven surfaces activity did not occur: Safety/medical concerns         Wheelchair     Assist Will patient use wheelchair at discharge?: Yes Type of Wheelchair: Manual    Wheelchair assist level: Set up assist Max wheelchair distance: 150'    Wheelchair 50  feet with 2 turns activity    Assist        Assist Level: Set up assist   Wheelchair 150 feet activity     Assist  Wheelchair 150 feet activity did not occur: Safety/medical concerns   Assist Level: Set up assist   Blood pressure (!) 167/95, pulse 79, temperature 99.6 F (37.6 C), temperature source Oral, resp. rate 18, weight 83 kg, SpO2 97 %.  Medical Problem List and Plan: 1.  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   Continue CIR 2.  Antithrombotics:  -DVT/anticoagulation: SCDs.  LE vascular study limited, but negative for DVT.             -antiplatelet therapy: Low-dose aspirin 81 mg daily 3. Pain Management:   Hydrocodone q 6 h prn increase to every 4 hours as needed, increased to 5-10/325 every 4 hours as needed on 9/16  Added oxy CR at noc low dose, monitor for nausea              -prn tylenol             -prn robaxin for spasms  Added tucks QID and lidocaine QID  Gabapentin 100 nightly started on 9/17, increased to 200 on 9/18 with good improvement for neuropathic pain  9/23- will try lyrica- 25 mg BID- gabapentin making him sleepy- will wean Gabapentin once on higher dose of Lyrica.   9/24- pain somewhat better this AM 4. Mood: Provide emotional support. Frank Fuller somewhat disheartened by multiple medical setbacks.              -antipsychotic agents: N/A 5. Neuropsych: This patient is capable of making decisions on his own behalf. 6. Skin/Wound Care: Routine skin checks 7. Fluids/Electrolytes/Nutrition: Routine in and outs 8. ID: Continue vancomycin as well as Maxipime through 07/29/2019 per infectious disease  Random level WNL on 9/19 9. Rectal ulcers/H. pylori.  Underwent EGD and flexible sigmoidoscopy 06/24/2019.  Follow-up per GI services.  Received Carafate enema x2 weeks as well as Flagyl x14 days initiated 06/26/2019 and tetracycline x14 days, completed  course on 9/17.  -GI following and have discussed with Frank Fuller re: continuing enemas  -continue to follow hgbs and if any significant bleeding recurs he will need flex sig per Frank Fuller  Hemoglobin 9.1 on 9/19  9/23- ordered labs for now and tomorrow- Frank Fuller reports "pool of blood" from carafate enema last night.   Continue to monitor  9/24- Hb stable- wil avoid enema x 24 hours per GI-  Will con't to attempt to do bowel program 10.  Acute on chronic normocytic anemia.  Patient did receive 2 units packed red blood cells 06/23/2019 and 1 unit PRBC 07/01/2019 and remains on Aranesp.    See #9. 11.  End-stage renal disease.  Continue hemodialysis per renal services.             -HD after therapies  to maximize rehab participation 12.  Thoracic aortic dissection.  Status post endovascular stent placement 06/06/2019.  Follow-up per Dr. Oneida Alar 13.  Neurogenic bowel/ bladder. I's and O's reviewed.   -He is essentially anuric.    -We will have patient sit up after administration for bowel movement, nursing to check for impaction  9/22- said doing bowel program daily- will check with nursing. 14.  Hypertension as well as history of first-degree AV block.  Norvasc 10 mg nightly, hydralazine 100 mg every 8 hours, labetalol 300 mg twice daily.  Monitor with increased mobility in therapies.   Spironolactone 25 daily started on 9/13  Losartan 100 daily started on 9/18 per Nephro recs  Remains extremely elevated on 9/20 with hypertensive crisis, however patient missed several doses of medications yesterday.  Will need to be more diligent about administering medications, particularly given #12  Need to have better blood pressure control  9/21- BP 180-190/90s still- will d/w Nephrology  9/22- will add Clonidine #2 patch- not many other choices and recommended by Nephrology. However per chart, clonidine causes dizziness- will speak with Frank Fuller prior.  9/23- per Frank Fuller, CANNOT tolerate Clonidine- will d/w  Nephrology.  9/24- BP better in 160s/90s was 170s-190/110s Vitals:   07/16/19 1954 07/17/19 0533  BP: (!) 165/95 (!) 167/95  Pulse: 76 79  Resp: 16 18  Temp: 98.6 F (37 C) 99.6 F (37.6 C)  SpO2: 97% 97%  15.  Hyperlipidemia.  Lipitor 16.  Tobacco abuse.  Counseling 17.  Thrombocytopenia  Platelets 141 on 9/19  Continue to monitor 18.  Hyperglycemia  Elevated on 9/19, continue to monitor 19. Thrush  9/21- will start Diflucan and dose in evening; and Magic Mouthwash  9/22- says doing better   LOS: 13 days A FACE TO FACE EVALUATION WAS PERFORMED  Frank Fuller 07/17/2019, 8:52 AM

## 2019-07-17 NOTE — Progress Notes (Signed)
Occupational Therapy Weekly Progress Note  Patient Details  Name: Frank Fuller MRN: [id number] Date of Birth: 10/03/1961  Beginning of progress report period: July 11, 2019 End of progress report period: July 17, 2019  Today's Date: 07/17/2019 OT Individual Time: 1000-1045 OT Individual Time Calculation (min): 45 min  and Today's Date: 07/17/2019 OT Missed Time: 15 Minutes Missed Time Reason: Other (comment)(Breakfast)   Patient has met 3 of 3 short term goals.  Pt is making slow but steady progress towards OT goals. He has had limited participation in therapies 2/2 rectal pain and fatigue/generalized weakness and deconditioning. When pt is feeling well, he can complete ADLs at bed level in long sitting with set-up/supervision and complete squat pivot transfers with supervision. However, when pt is fatigued, he does not participate in therapy and remains in bed with eyes closed, not initiating conversation or discussion with therapist.  Ongoing SCI education, planning for w/c level at d/c, bowel program, and modified ADLs/IADLs.  Will need to complete hands on family training prior to pt's d/c home at end of next week.   Patient continues to demonstrate the following deficits: muscle weakness and muscle paralysis, decreased cardiorespiratoy endurance and decreased sitting balance, decreased standing balance, decreased postural control and decreased balance strategies and therefore will continue to benefit from skilled OT intervention to enhance overall performance with BADL and Reduce care partner burden.  Patient progressing toward long term goals..  Continue plan of care.  OT Short Term Goals Week 2:  OT Short Term Goal 1 (Week 2): Pt will complete 3/3 toileting tasks on BSC with mod A without use of mechanical lift in prep for d/c home at w/c level OT Short Term Goal 1 - Progress (Week 2): Met OT Short Term Goal 2 (Week 2): Pt will set-up all equipment needed for sliding board  transfer with supervision/VC only in prep for transfers at mod I level OT Short Term Goal 2 - Progress (Week 2): Met OT Short Term Goal 3 (Week 2): Pt will don socks/shoes with supervision using AE PRN OT Short Term Goal 3 - Progress (Week 2): Met Week 3:  OT Short Term Goal 1 (Week 3): STG=LTG due to LOS  Skilled Therapeutic Interventions/Progress Updates:    Therapist arrived for 7:30 scheduled tx session. PT awake sitting up in bed eating breakfast. He reports feeling much better than yesterday. However, pt declining coming to EOB to eat breakfast and requesting therapist to return at later time. Will attempt to make up time as pt willing/able.   Therapist returned at 10:00, pt in bed and agreeable to tx session. Denied pain throughout session.  Addressed toileting task and toilet transfers. Completed squat pivot transfer w/c>drop arm BSC with min questioning cues for proper set-up of equipment. Once on toilet, education/demonstration for techniques of lateral leans in order to pull pants down. Return demonstrated ability to pull pants up/down via lateral leans with guarding assist and increased time/rest breaks required.  He returned to w/c with supervision.  Discussed at length, d/c planning, planning out of day and ordering things for increased independence/safety, for example donning pants at bed level prior to AM OOB activities. While pt did demonstrate some good functional problem solving ideas, he also cont to demonstrate poor insight into severity of deficits, including no standing/ambulation at d/c. Will need to cont to education regarding ADL/IADL routines at w/c level. Discussed home bathroom set-up, recommend removing shower door from walk in shower and use of TTB  To maneuver of   shower threshold, pt does not understand why he cant just step over threshold. Pt returned to ADL apartment at end of session with hand off to next OT.   Therapy Documentation Precautions:   Precautions Precautions: Fall Precaution Comments: paraplegic Restrictions Weight Bearing Restrictions: No   Therapy/Group: Individual Therapy  ,  L 07/17/2019, 6:55 AM  

## 2019-07-17 NOTE — Plan of Care (Signed)
  Problem: Consults Goal: RH GENERAL PATIENT EDUCATION Description: See Patient Education module for education specifics. Outcome: Progressing   Problem: RH BOWEL ELIMINATION Goal: RH STG MANAGE BOWEL WITH ASSISTANCE Description: STG Manage Bowel with Assistance. mod Outcome: Progressing Flowsheets (Taken 07/17/2019 1344) STG: Pt will manage bowels with assistance: 4-Minimum assistance   Problem: RH SKIN INTEGRITY Goal: RH STG SKIN FREE OF INFECTION/BREAKDOWN Description: Remain free of breakdown with mod assist Outcome: Progressing   Problem: RH SAFETY Goal: RH STG ADHERE TO SAFETY PRECAUTIONS W/ASSISTANCE/DEVICE Description: STG Adhere to Safety Precautions With Assistance/Device. mod Outcome: Progressing Flowsheets (Taken 07/17/2019 1344) STG:Pt will adhere to safety precautions with assistance/device: 4-Minimal assistance Goal: RH STG DECREASED RISK OF FALL WITH ASSISTANCE Description: STG Decreased Risk of Fall With Assistance. Adhere to safety precautions, will have no falls w/ mod assist Outcome: Progressing   Problem: RH PAIN MANAGEMENT Goal: RH STG PAIN MANAGED AT OR BELOW PT'S PAIN GOAL Description: Less than 3 Outcome: Progressing   Problem: RH KNOWLEDGE DEFICIT GENERAL Goal: RH STG INCREASE KNOWLEDGE OF SELF CARE AFTER HOSPITALIZATION Description: Patient will be able to describe self care at home following hospitalization with cues/reminders Outcome: Progressing

## 2019-07-17 NOTE — Progress Notes (Signed)
Occupational Therapy Session Note  Patient Details  Name: Frank Fuller MRN: [id number] Date of Birth: 1961-09-06  Today's Date: 07/17/2019 OT Individual Time: 1045-1200 OT Individual Time Calculation (min): 75 min    Short Term Goals: Week 3:  OT Short Term Goal 1 (Week 3): STG=LTG due to LOS  Skilled Therapeutic Interventions/Progress Updates:    Pt received in handoff from primary OT.  Engaged in w/c mobility in ADL apt, shower transfers, and BLE strengthening.  Educated on shower transfer with tub bench placed over ledge of walk-in shower.  Pt to speak with brother and landlord regarding removal of shower doors to allow for tub bench placement.  Pt completed transfer w/c <> tub bench with CGA x2 and min cues for placement of feet prior to transfers.  Engaged in home making activity with simulated meal prep, incorporating gathering items from refrigerator, low cabinets, and oven.  Increased time allowed for problem solving positioning of w/c to reach items.  Min cues and demonstration to increase positioning and setup to ensure increased safety while completing meal prep tasks.  Pt appreciative of information.  Engaged in [place] on [place] with pt able to complete 4 reps of 2 mins with improved control this session, however still reporting easily fatigued with activity.  Returned to room and left upright in w/c with all needs in reach.  Therapy Documentation Precautions:  Precautions Precautions: Fall Precaution Comments: paraplegic Restrictions Weight Bearing Restrictions: No General: General OT Amount of Missed Time: 15 Minutes Vital Signs: Therapy Vitals Temp: 99.2 F (37.3 C) Temp Source: Oral Pulse Rate: 71 Resp: 14 BP: (!) 163/91 Patient Position (if appropriate): Lying Oxygen Therapy SpO2: 93 % O2 Device: Room Air Pain: Pt reports pain 6/10.  Premedicated.  Therapy/Group: Individual Therapy  Simonne Come 07/17/2019, 12:40 PM

## 2019-07-17 NOTE — Progress Notes (Signed)
Physical Therapy Session Note  Patient Details  Name: Burtis Roscoe MRN: [id number] Date of Birth: [birth year]  Today's Date: 07/17/2019 PT Individual Time: 0900-0945 PT Individual Time Calculation (min): 45 min   Short Term Goals: Week 2:  PT Short Term Goal 1 (Week 2): Pt will perform bed mobility at mod I level PT Short Term Goal 2 (Week 2): Pt will perform least restrictive transfers at Supervision consistently PT Short Term Goal 3 (Week 2): Pt will perform sit to stand with min A  Skilled Therapeutic Interventions/Progress Updates:    Pt received seated in w/c in room, agreeable to PT session. Pt reports still feeling "weak" but much improved from yesterday. Pt has completed home measurement sheet from his brother. Pt's bed at home is 33" tall, he states he can have the box springs removed to decreased bed height. Pt also states if bed is too high he can just stand up and walk over to his bed from his w/c. Education with patient about expected functional level upon d/c and that he will be using a w/c for mobility and although we will continue to work on standing in therapy and working towards gait he will not be able to functionally ambulate upon d/c. Pt understanding of education. Squat pivot transfer w/c to/from 27" high mat table to better simulate bed height once box springs removed, CGA. Ascend/descend ramp in w/c with min A to initiate ascent, then Supervision. Education with patient about body positioning and anterior lean when ascending ramp for improved momentum and then control with use of BUE on w/c rims when descending. Manual w/c propulsion x 200 ft with use of BUE at mod I level. Pt just requires min cueing for management of w/c parts for squat pivot transfers. Nustep level 2 x 10 min with use of B UE/LE with focus on LE>UE for global endurance training. Pt left seated in w/c in room with needs in reach at end of session.  Therapy Documentation Precautions:   Precautions Precautions: Fall Precaution Comments: paraplegic Restrictions Weight Bearing Restrictions: No    Therapy/Group: Individual Therapy   Excell Seltzer, PT, DPT  07/17/2019, 12:38 PM

## 2019-07-18 ENCOUNTER — Inpatient Hospital Stay (HOSPITAL_COMMUNITY): Payer: Medicare Other

## 2019-07-18 ENCOUNTER — Inpatient Hospital Stay (HOSPITAL_COMMUNITY): Payer: Medicare Other | Admitting: Occupational Therapy

## 2019-07-18 MED ORDER — PREGABALIN 50 MG PO CAPS
50.0000 mg | ORAL_CAPSULE | Freq: Two times a day (BID) | ORAL | Status: DC
  Administered 2019-07-18 – 2019-07-28 (×20): 50 mg via ORAL
  Filled 2019-07-18 (×21): qty 1

## 2019-07-18 NOTE — Progress Notes (Signed)
[facility] PHYSICAL MEDICINE & REHABILITATION PROGRESS NOTE   Subjective/Complaints:   Pt reports doing MUCH better- pain is 3/10 unless doing bowel movements (bowel program as well) pain is 4-5/10 during BMs.  To be on IV ABX until 10/6  Pt and I discussed benefits and concerns about going to [place] for prolonged period this AM- 15 minutes going over pro's and con's-esp getting W/C as well as ramp and getting pt more estim as well. Pt said would think about it.     ROS: Denies CP, SOB, N/V/D  Objective:   No results found. Recent Labs    07/16/19 0956 07/17/19 0606  WBC 4.3 4.9  HGB 9.6* 9.5*  HCT 30.7* 29.6*  PLT 145* 149*   Recent Labs    07/15/19 1411 07/17/19 0606  NA 135 134*  K 5.2* 5.1  CL 94* 96*  CO2 22 25  GLUCOSE 121* 90  BUN 70* 41*  CREATININE 12.10* 9.20*  CALCIUM 9.4 9.3    Intake/Output Summary (Last 24 hours) at 07/18/2019 0951 Last data filed at 07/17/2019 1700 Gross per 24 hour  Intake 240 ml  Output 1900 ml  Net -1660 ml     Physical Exam: Vital Signs Blood pressure (!) 164/94, pulse 79, temperature 99.8 F (37.7 C), temperature source Oral, resp. rate 14, weight 84.7 kg, SpO2 99 %. Constitutional: No distress . Vital signs reviewed. Awake, alert, appropriate, rating pain 3/10- sitting up in manual w/c, brighter affect, NAD HENT: Normocephalic.  Atraumatic. Eyes: EOMI. No discharge. Cardiovascular: RRR Respiratory: CTA B/L GI: Non-distended. Skin: Warm and dry.  Intact. Psych: appropriate and brighter affect; actually smiled throughout interview Musc: No edema in extremities.  No tenderness in extremities. Neurological: Alert Motor: Bilateral upper extremities: 5/5 proximal distal, unchanged Right lower extremity: Hip flexion, knee extension 2/5, ankle dorsiflexion 4/5, unchanged Left lower extremity: Hip flexion 2-/5, knee extension 2+/5, ankle dorsiflexion 4/5, unchanged  Assessment/Plan: 1. 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  Physiatrist is providing close team supervision and 24 hour management of active medical problems listed below.  Physiatrist and rehab team continue to assess barriers to discharge/monitor patient progress toward functional and medical goals  Care Tool:  Bathing  Bathing activity did not occur: Refused Body parts bathed by patient: Right arm, Left arm, Chest, Abdomen, Front perineal area, Right upper leg, Left upper leg, Right lower leg, Left lower leg, Face, Buttocks   Body parts bathed by helper: Buttocks     Bathing assist Assist Level: Supervision/Verbal cueing(Bed level)     Upper Body Dressing/Undressing Upper body dressing   What is the patient wearing?: Pull over shirt    Upper body assist Assist Level: Set up assist    Lower Body Dressing/Undressing Lower body dressing      What is the patient wearing?: Pants, Incontinence brief     Lower body assist Assist for lower body dressing: Minimal Assistance - Patient > 75%(Bed level)     Toileting Toileting    Toileting assist Assist for toileting: Total Assistance - Patient < 25%     Transfers Chair/bed transfer  Transfers assist     Chair/bed transfer assist level: Contact Guard/Touching assist     Locomotion Ambulation   Ambulation assist   Ambulation activity did not occur: Safety/medical concerns          Walk 10 feet activity  Assist  Walk 10 feet activity did not occur: Safety/medical concerns        Walk 50 feet activity   Assist Walk 50 feet with 2 turns activity did not occur: Safety/medical concerns         Walk 150 feet activity   Assist Walk 150 feet activity did not occur: Safety/medical concerns         Walk 10 feet on uneven surface  activity   Assist Walk 10 feet on uneven surfaces activity did not occur: Safety/medical  concerns         Wheelchair     Assist Will patient use wheelchair at discharge?: Yes Type of Wheelchair: Manual    Wheelchair assist level: Independent Max wheelchair distance: 150'    Wheelchair 50 feet with 2 turns activity    Assist        Assist Level: Independent   Wheelchair 150 feet activity     Assist  Wheelchair 150 feet activity did not occur: Safety/medical concerns   Assist Level: Independent   Blood pressure (!) 164/94, pulse 79, temperature 99.8 F (37.7 C), temperature source Oral, resp. rate 14, weight 84.7 kg, SpO2 99 %.  Medical Problem List and Plan: 1.  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   Continue CIR 2.  Antithrombotics:  -DVT/anticoagulation: SCDs.  LE vascular study limited, but negative for DVT.             -antiplatelet therapy: Low-dose aspirin 81 mg daily 3. Pain Management:   Hydrocodone q 6 h prn increase to every 4 hours as needed, increased to 5-10/325 every 4 hours as needed on 9/16  Added oxy CR at noc low dose, monitor for nausea              -prn tylenol             -prn robaxin for spasms  Added tucks QID and lidocaine QID  Gabapentin 100 nightly started on 9/17, increased to 200 on 9/18 with good improvement for neuropathic pain  9/23- will try lyrica- 25 mg BID- gabapentin making him sleepy- will wean Gabapentin once on higher dose of Lyrica.   9/24- pain somewhat better this AM  9/25- Lyrica really helping- pain down to 3/10- will con't for now and try to reduce Gabapentin next week. Will increase Lyrica to 50 mg BID to cover Gabapentin dose and stop Gabapentin on Monday. 4. Mood: Provide emotional support. Pt somewhat disheartened by multiple medical setbacks.              -antipsychotic agents: N/A 5. Neuropsych: This patient is capable of making decisions on his own behalf. 6. Skin/Wound Care:  Routine skin checks 7. Fluids/Electrolytes/Nutrition: Routine in and outs 8. ID: Continue vancomycin as well as Maxipime through 07/29/2019 per infectious disease  Random level WNL on 9/19 9. Rectal ulcers/H. pylori.  Underwent EGD and flexible sigmoidoscopy 06/24/2019.  Follow-up per GI services.  Received Carafate enema x2 weeks as well as Flagyl x14 days initiated 06/26/2019 and tetracycline x14 days, completed course on 9/17.  -GI following and have discussed with pt re: continuing enemas  -continue to follow hgbs and if any significant bleeding recurs he will need flex sig per Dr. Doyne Keel note  Hemoglobin 9.1 on 9/19  9/23- ordered labs for now and tomorrow- pt reports "pool of blood" from carafate enema last night.   Continue to monitor  9/24- Hb stable-  wil avoid enema x 24 hours per GI-  Will con't to attempt to do bowel program  9/25- discussed need to con't bowel program- will check with nursing that they are doing- explained it takes 3-6 weeks to train gut. 10.  Acute on chronic normocytic anemia.  Patient did receive 2 units packed red blood cells 06/23/2019 and 1 unit PRBC 07/01/2019 and remains on Aranesp.    See #9. 11.  End-stage renal disease.  Continue hemodialysis per renal services.             -HD after therapies to maximize rehab participation 12.  Thoracic aortic dissection.  Status post endovascular stent placement 06/06/2019.  Follow-up per Dr. Oneida Alar 13.  Neurogenic bowel/ bladder. I's and O's reviewed.   -He is essentially anuric.    -We will have patient sit up after administration for bowel movement, nursing to check for impaction  14.  Hypertension as well as history of first-degree AV block.  Norvasc 10 mg nightly, hydralazine 100 mg every 8 hours, labetalol 300 mg twice daily.  Monitor with increased mobility in therapies.   Spironolactone 25 daily started on 9/13  Losartan 100 daily started on 9/18 per Nephro recs  Remains extremely elevated on 9/20 with  hypertensive crisis, however patient missed several doses of medications yesterday.  Will need to be more diligent about administering medications, particularly given #12  Need to have better blood pressure control  9/21- BP 180-190/90s still- will d/w Nephrology  9/22- will add Clonidine #2 patch- not many other choices and recommended by Nephrology. However per chart, clonidine causes dizziness- will speak with pt prior.  9/23- per pt, CANNOT tolerate Clonidine- will d/w Nephrology.  9/24- BP better in 160s/90s was 170s-190/110s Vitals:   07/17/19 2001 07/18/19 0451  BP: (!) 170/96 (!) 164/94  Pulse: 79 79  Resp: 12 14  Temp: 99.7 F (37.6 C) 99.8 F (37.7 C)  SpO2:  99%  15.  Hyperlipidemia.  Lipitor 16.  Tobacco abuse.  Counseling 17.  Thrombocytopenia  Platelets 141 on 9/19  Continue to monitor 18.  Hyperglycemia  Elevated on 9/19, continue to monitor 19. Thrush  9/21- will start Diflucan and dose in evening; and Magic Mouthwash  9/22- says doing better   LOS: 14 days A FACE TO FACE EVALUATION WAS PERFORMED  Shanay Woolman 07/18/2019, 9:51 AM

## 2019-07-18 NOTE — Progress Notes (Signed)
Recreational Therapy Session Note  Patient Details  Name: Frank Fuller MRN: [id number] Date of Birth: 09-13-1961 Today's Date: 07/18/2019   TR eval deferred at this time as pt is having difficulty tolerating currently therapy and schedule has been modified to 15/7.  Will continue to monitor through team. .[place] 07/18/2019, 8:16 AM

## 2019-07-18 NOTE — Progress Notes (Addendum)
[facility] Progress Note   Subjective:   Patient seen in room. Feeling better, happy with his PT progress but not able to walk yet. Denies SOB, dyspnea, CP, palpitations, abdominal pain, N/V/D. No new concerns.   Objective Vitals:   07/17/19 1630 07/17/19 1700 07/17/19 2001 07/18/19 0451  BP: (!) 139/94 (!) 157/93 (!) 170/96 (!) 164/94  Pulse: 80 80 79 79  Resp:  18 12 14   Temp:  98.6 F (37 C) 99.7 F (37.6 C) 99.8 F (37.7 C)  TempSrc:  Oral Oral Oral  SpO2:    99%  Weight:       Physical Exam General: Well developed, alert male in NAD Heart: RRR, no murmurs, rubs or gallops Lungs: Lungs CTA bilaterally without wheezing, rhonchi or rales Abdomen: Abdomen soft, non-tender, non-distended. + BS Extremities: No peripheral edema. Wiggles toes on both feet Dialysis Access:  R IJ TDC without erythema/drainage  Additional Objective Labs: Basic Metabolic Panel: Recent Labs  Lab 07/12/19 1357 07/15/19 1411 07/17/19 0606  NA 135 135 134*  K 4.4 5.2* 5.1  CL 97* 94* 96*  CO2 25 22 25   GLUCOSE 132* 121* 90  BUN 49* 70* 41*  CREATININE 9.17* 12.10* 9.20*  CALCIUM 9.3 9.4 9.3  PHOS 6.1* 5.8*  --    Liver Function Tests: Recent Labs  Lab 07/12/19 1357 07/15/19 1411 07/17/19 0606  AST  --   --  17  ALT  --   --  10  ALKPHOS  --   --  69  BILITOT  --   --  0.2*  PROT  --   --  6.1*  ALBUMIN 2.1* 2.1* 2.1*   CBC: Recent Labs  Lab 07/12/19 1357 07/15/19 1411 07/16/19 0956 07/17/19 0606  WBC 4.8 4.4 4.3 4.9  NEUTROABS  --   --  2.4 2.7  HGB 9.1* 9.0* 9.6* 9.5*  HCT 29.6* 29.2* 30.7* 29.6*  MCV 95.5 96.4 94.8 93.7  PLT 141* 150 145* 149*   Blood Culture    Component Value Date/Time   SDES BLOOD LEFT HAND 07/03/2019 1644   SPECREQUEST  07/03/2019 1644    BOTTLES DRAWN AEROBIC ONLY Blood Culture adequate volume   CULT  07/03/2019 1644    NO GROWTH 5 DAYS Performed at [facility], [place] [address] [place], [place] [address]     REPTSTATUS 07/08/2019 FINAL 07/03/2019 1644    Medications: . ceFEPime (MAXIPIME) IV Stopped (07/17/19 1751)  . vancomycin Stopped (07/17/19 1751)   . amLODipine  10 mg Oral QHS  . atorvastatin  20 mg Oral q1800  . citalopram  10 mg Oral Daily  . darbepoetin (ARANESP) injection - DIALYSIS  150 mcg Intravenous Q Sat-HD  . feeding supplement (NEPRO CARB STEADY)  237 mL Oral BID BM  . fluconazole  100 mg Oral QHS  . gabapentin  200 mg Oral QHS  . hydrALAZINE  100 mg Oral Q8H  . labetalol  300 mg Oral BID  . lanthanum  1,000 mg Oral TID WC  . lidocaine  1 application Topical Once  . losartan  100 mg Oral Daily  . magic mouthwash w/lidocaine  10 mL Oral QID  . multivitamin  1 tablet Oral QHS  . oxyCODONE  10 mg Oral QHS  . pantoprazole  40 mg Oral BID  . pregabalin  50 mg Oral BID  . spironolactone  25 mg Oral Daily  . Sucralfate Enema (Sucralfate 2 g/Sterile Water 60 ml Enema)  2  g Rectal Q12H  . witch hazel-glycerin   Topical QID    Dialysis Orders: [facility] 4h 75min 400/1.5 87.5kg 2/2.25 P2 R TDC Hep none - Parsabiv 2.5mg  IV q HD  - Calcitriol 1.67mcg PO q HD  Assessment/Plan: 1. Spinal cord infarct/ paraperesis: per spinal MRI 8/23at T9- T11, following presentation with inability to move his legs. Neuro, NSurg and VVS consulted, all felt etiology was likely spinal artery injury related to aortic dissection procedure. All recommended supportive care and hope for recovery. Per NSurg, no indication for surgery. Continue treatment with aspirin recommended. Now admitted to CIR for ongoing physical therapy/Occupational Therapy along with psychotherapy and pain management. 2. ESRD:Continue HD on TTS schedule - no heparin.K+ 5.1 pre-HD yesterday.   3. RecentType B aortic dissection (s/p stent graft repair 8/14):Goal SBP per VVS 120-170. Known severe resistant HTN. Continue to avoid intradialytic hypotension.  4. HTN/volume: Blood pressures remain high but overall  within goal of 120-170. Now on amlodipine 10, hydralazine 100 tid, labetalol 300 bid.  Spironolactone 25, losartan 100 added here .  If BP's still uncontrolled next step would try clonidine patch #2 or #3. Volume control with HD.  5. L4-L5 lumbar discitis: s/p recent aortic stentgraft. Plans noted for intravenous vancomycin and cefepime until 07/29/2019.  6. Hematochezia- recurrent:GI consulted.Flex sig 9/1 showedstigmata of recent bleedinganddiffusely ulcerated rectum.EGD with fewnon-bleeding erosions in gastric antrum. Rx'd w/ miralax, carafate < 2 weeks. Repeat Flex sig 9/4 withhemostatic spray to entire rectum.New bleeding event 9/23. GI following.  7. Anemia(ESRD + ABLA):Hgb stable today at 9.5. On Aranesp 150 q Sat. Transfuse when hemoglobin <7 8. Secondary hyperparathyroidism - Phos 6.1 > 5.8. Binders changed to Fosrenol d/t concern that Auryxia irritating his bowels. Calcium 9.3, corrected 10.8. Not currently on calcitriol. Use low calcium bath.Parsabiv not on hospital formulary. 9. Nutrition: Significant hypoalbuminemia noted likely following acute hospitalization/surgery. Continue renal diet with ONS. 10. Recent MSSA bacteremia/AVG infection:S/p AVG removal June/ July andAncefcoursecompleted. Reports plans for new AVF after discharge. 11. Depression -following recent sequence of clinical events.  On citalopram.   Anice Paganini, PA-C 07/18/2019, 12:30 PM  [facility] Pager: [phone]  Nephrology attending: Patient was seen and examined at bedside.  No new event.  Chart reviewed.  I agree with assessment and plan as outlined above. 58 year old male with aortic dissection, repair, spinal cord infarction with paraparesis, ESRD on HD TTS, had dialysis yesterday with around 2 L UF.  Plan for next dialysis tomorrow.  He looks comfortable without any complaint.  In room air.  Continue current medication.  Actively participating in inpatient  rehab.  Lawson Radar, MD [facility].

## 2019-07-18 NOTE — Progress Notes (Signed)
Occupational Therapy Session Note  Patient Details  Name: Frank Fuller MRN: [id number] Date of Birth: 1961/06/07  Today's Date: 07/18/2019 OT Individual Time: [id number] OT Individual Time Calculation (min): 60 min    Short Term Goals: Week 3:  OT Short Term Goal 1 (Week 3): STG=LTG due to LOS  Skilled Therapeutic Interventions/Progress Updates:    Treatment session with focus on BLE strengthening and functional transfers.  Pt received upright in w/c reporting already bathed and dressed and requesting to focus on BLE strengthening.  Engaged in sit > stand outside parallel bars with use of mirror for visual feedback in standing.  Pt required mod assist sit > stand and reports instability in BLE.  Pt able to correct stance in standing with small steps to straighten up.  Pt requested to attempt something of less challenge.  Engaged in sit> stand in Standing Frame with mod assist sit > stand.  Upon standing, pt reports need to have BM.  Returned to sitting in w/c and back to room.  Pt completed squat pivot transfer w/c > drop arm BSC with CGA.  Pt able to push up from [facility] to allow therapist to doff pants.  Pt required increased time on toilet, therefore left upright on BSC with call bell in reach and nurse tech alerted to pt position.  Therapy Documentation Precautions:  Precautions Precautions: Fall Precaution Comments: paraplegic Restrictions Weight Bearing Restrictions: No Pain: Pain Assessment Pain Scale: 0-10 Pain Score: 3  Pain Type: Acute pain   Therapy/Group: Individual Therapy  Simonne Come 07/18/2019, 9:11 AM

## 2019-07-18 NOTE — Patient Care Conference (Signed)
Inpatient RehabilitationTeam Conference and Plan of Care Update Date: 07/16/2019   Time: 9:55 AM    Patient Name: Frank Fuller      Medical Record Number: [id number]  Date of Birth: 07-10-1961 Sex: Male         Room/Bed: 4M04C/4M04C-01 Payor Info: Payor: MEDICARE / Plan: MEDICARE PART A AND B / Product Type: *No Product type* /    Admit Date/Time:  07/04/2019  5:39 PM  Primary Diagnosis:  Paraplegia [facility])  Patient Active Problem List   Diagnosis Date Noted  . Neurogenic bowel   . Renovascular hypertension   . Neuropathic pain   . Hypertensive crisis   . Essential hypertension   . Acute on chronic anemia   . Bacteremia   . Reactive depression   . Rectal bleeding 07/04/2019  . Vertebral osteomyelitis ([place]) 07/04/2019  . Spinal cord infarction ([place]) 07/04/2019  . Discitis 07/04/2019  . 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 Paraplegia ([place]) 07/03/2019  . H. pylori infection   . Gastric ulcer   . Rectal ulceration   . Lumbar discitis 06/16/2019  . Ileus ([place]) 06/13/2019  . Proctitis 06/13/2019  . H/O endovascular stent graft for abdominal aortic aneurysm 06/13/2019  . Sepsis ([place]) 06/13/2019  . Sepsis ([place]) 06/13/2019  . Aortic dissection ([place]) 06/03/2019  . Encounter for central line placement   . Hypertensive emergency   . Aortic dissection distal to left subclavian ([place]) 05/26/2019  . Problem with dialysis access ([place]) 04/22/2019  . Anemia 04/22/2019  . MSSA bacteremia 04/15/2019  . Pneumonia 04/14/2019  . Hospital-acquired pneumonia 04/14/2019  . ESRD needing dialysis ([place]) 11/11/2018  . Hemoptysis   . Pulmonary edema 08/02/2017  . Acute kidney injury superimposed on chronic kidney disease ([place]) 07/20/2017  . ESRD on dialysis ([place]) 07/19/2017  . ESRD on hemodialysis ([place]) 07/16/2017  . AAA (abdominal aortic aneurysm) without rupture ([place]) 07/15/2017  . Hypertensive urgency 07/14/2017  . Acute encephalopathy   . Wide-complex tachycardia ([place])   . CVA  (cerebral vascular accident) ([place]) 12/13/2016  . Hyponatremia 12/13/2016  . SVT (supraventricular tachycardia) ([place])   . 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 ARF (acute renal failure) ([place]) 12/10/2016  . Thrombocytopenia ([place]) 12/10/2016  . Elevated troponin 12/10/2016  . Accelerated hypertension 12/10/2016  . Hiccups 12/10/2016  . Elevated bilirubin 12/10/2016  . Nausea with vomiting 12/10/2016  . Uremia 12/10/2016    Expected Discharge Date: Expected Discharge Date: 07/25/19  Team Members Present: Physician leading conference: Dr. Courtney Heys Social Worker Present: Alfonse Alpers, LCSW Nurse Present: Rayetta Pigg, RN PT Present: Excell Seltzer, PT OT Present: Amy Rounds, OT PPS Coordinator present : Gunnar Fusi, SLP     Current Status/Progress Goal Weekly Team Focus  Bowel/Bladder   Pt anuric, Pt incontinent at times of bowel. Sucralfate enema Q12. LBM 07/15/2019  Continue enema adminstration to maintain regular bowel pattern.  Assist with toileting needs PRN.   Swallow/Nutrition/ Hydration             ADL's   Supervision squat pivot transfers, supervision-min A bathing/dressing bed level, total A toileting  Supervision-mod I overall w/c level  ADL/IADL re-training, OOB tolerance, functional activity tolerance, SCI education, family education and d/c planning   Mobility   Supervision bed mobility, Supervision to CGA squat pivot transfers, Supervision w/c mobility, min to mod sit to stand  Supervision overall, mod A short distance gait  LE NMR, standing, SCI Contractor  Safety/Cognition/ Behavioral Observations            Pain   Pt has pain of 7 to bilat buttocks. Pain managed with Norco.  Pain <4  Assess pain Q shift anf PRN.   Skin   No skin issues.  Maintian skin integrity and prevent skin breakdown.  Assess skin Q shift and PRN.    Rehab Goals Patient on target to meet rehab goals: Yes Rehab Goals Revised: none *See Care Plan  and progress notes for long and short-term goals.     Barriers to Discharge  Current Status/Progress Possible Resolutions Date Resolved   Nursing                  PT  Decreased caregiver support;Inaccessible home environment;Lack of/limited family support;Neurogenic Bowel & Bladder;Incontinence;Home environment access/layout                 OT                  SLP                SW                Discharge Planning/Teaching Needs:  Pt to d/c home with only intermittent assistance of brother.  Pt's brother to come next week for family education.   Team Discussion:  Pt reported to MD "tons" of blood with enema last night.  GI had been consulted and team will hold bowel program for 24 hours and labs will be checked.  Gabapentin is making pt a little too sleepy, so Dr. Dagoberto Ligas is starting pt on Lyrica.  RN stated that pt had more mucous than blood in stool and wondering if pt was going home on antibiotics.  Pt is supervision for squat pivot txs and doing ADLs at bed level.  OT is planning w/c level at home.  Pt refused PT this morning due to pain.  Pt is supervision with bed mobility and txs.  Pt is min/mod in standing.  W/C eval this week.     Revisions to Treatment Plan:  Schedule changed to 15/7 due to pt's tolerance level - MD agrees with therapy team    Medical Summary Current Status: rectal pain- working on titration of meds Weekly Focus/Goal: adding Lyrica  Barriers to Discharge: Behavior;Decreased family/caregiver support;Home enviroment access/layout;Neurogenic Bowel & Bladder;Incontinence;Other (comments)  Barriers to Discharge Comments: rectal pain Possible Resolutions to Barriers: titraiton of meds   Continued Need for Acute Rehabilitation Level of Care: The patient requires daily medical management by a physician with specialized training in physical medicine and rehabilitation for the following reasons: Direction of a multidisciplinary physical rehabilitation program to  maximize functional independence : Yes Medical management of patient stability for increased activity during participation in an intensive rehabilitation regime.: Yes Analysis of laboratory values and/or radiology reports with any subsequent need for medication adjustment and/or medical intervention. : Yes   I attest that I was present, lead the team conference, and concur with the assessment and plan of the team.Team conference was held via web/ teleconference due to [place] - 19.   Meagen Limones, Silvestre Mesi 07/18/2019, 9:19 PM

## 2019-07-18 NOTE — Progress Notes (Signed)
Pharmacy Antibiotic Note  Frank Fuller is a 58 y.o. male admitted on 07/04/2019.  Pharmacy has been consulted for Vanco/Cefepime dosing for vertebral osteomyelitis/discitis until 10/6 per ID recommendations. WBC is wnl. Patient remains afebrile.  ESRD - Patient remains on TTS HD schedule - next HD tomorrow.   8/23 BCx >> neg 8/25 IR disc aspirate >> neg 9/10 BCx - neg  Pre-HD vancomycin goal: 15-25 mcg/mL  Plan: -Vancomycin 1g IV qHD TTS -Cefepime 2g IV qTTS at 1800 -Pre-HD vancomycin level tomorrow -Monitor HD schedule/tolerance inpatient -LOT - 6 weeks per ID   Weight: 186 lb 11.7 oz (84.7 kg)  Temp (24hrs), Avg:99 F (37.2 C), Min:97.6 F (36.4 C), Max:99.8 F (37.7 C)  Recent Labs  Lab 07/12/19 1145 07/12/19 1357 07/15/19 1411 07/16/19 0956 07/17/19 0606  WBC  --  4.8 4.4 4.3 4.9  CREATININE  --  9.17* 12.10*  --  9.20*  VANCORANDOM 24  --   --   --   --     Estimated Creatinine Clearance: 10.2 mL/min (A) (by C-G formula based on SCr of 9.2 mg/dL (H)).    Allergies  Allergen Reactions  . Oxycodone Nausea Only  . Pramoxine     Other reaction(s): Skin Rash  . Chlorhexidine Other (See Comments)    Unknown reaction Patch skin test done at dialysis 06/26/17  - staff using clear dressing and alcohol to clean exit site of catheter  . Clonidine Derivatives Other (See Comments)    Dizziness   . Fluorouracil     Other reaction(s): Skin Rash  . Lisinopril Other (See Comments)    unresponsive  . Sensipar  [Cinacalcet Hcl]     Other reaction(s): agitation  . Carvedilol Rash     Harvel Quale 07/18/2019 11:20 AM

## 2019-07-18 NOTE — Progress Notes (Signed)
Physical Therapy Session Note  Patient Details  Name: Frank Fuller MRN: [id number] Date of Birth: November 16, 1960  Today's Date: 07/18/2019 PT Individual Time: 2197-5883 PT Individual Time Calculation (min): 74 min   Short Term Goals: Week 1:  PT Short Term Goal 1 (Week 1): Pt will perform supine<>sit with CGA PT Short Term Goal 1 - Progress (Week 1): Met PT Short Term Goal 2 (Week 1): Pt will perform bed<>chair transfers with CGA PT Short Term Goal 2 - Progress (Week 1): Met PT Short Term Goal 3 (Week 1): Pt will initiate sit<>stand transfers and gait training PT Short Term Goal 3 - Progress (Week 1): Met PT Short Term Goal 4 (Week 1): Pt will demonstrate understanding of w/c parts with min cuing and min assist to set-up for bed<>chair transfers PT Short Term Goal 4 - Progress (Week 1): Met Week 2:  PT Short Term Goal 1 (Week 2): Pt will perform bed mobility at mod I level PT Short Term Goal 2 (Week 2): Pt will perform least restrictive transfers at Supervision consistently PT Short Term Goal 3 (Week 2): Pt will perform sit to stand with min A Week 3:     Skilled Therapeutic Interventions/Progress Updates:     Supine to sit mod I Donned shoes total assist in sitting, cga for balance Bed to wc squat pivot w/cga and set up.  Pt noted to have knees higher than hips in current wc, footplates lowered to achieve 90/90 position and distribute pressure more evenly.   wc propulsion 110f mod I including turning and backing.  Sit to stand in parallel bars x 2w/mod assist, stood 265m, 30sec w/mod assist of 1 and mirror for visual feedback, verbal and tactile cues for midline and to promote extension at knees and hips. Pt became incontinent of bowels w/standing, returned to room  wc to [facility]al Healthquat pivot w/cga/setup. Sit to squat w/min assist and total assist for lowering pants and brief. Pt able to perform hygiene w/set up only. Sit to squat w/min assist and total assist for raising pants/brief.   Squat pivot BSC to wc w/set up assist and cga.  wc propulsion 15063f/bilat UE's for endurance mod I.  wc to/from Nustep squat pivot w/set up, cga NuStep L2 to L1 x 10 min total alternating between exts x 4 to attempting w/LE's only for bipedal activity.  wc propulsion w/bilat LE's initially w/mod assist to facilitatate quads/hams/hip flexion but decreased to min assist as pt developed rhythm w/task.  Propelled 46f30fin reverse x 2, 15 ft x 3 forward propulsion, notable ant tib activation w/activity.  Pt encouraged to unlock brakes w/feet planted on floor and rock chair forward back using quads/hams, performed 45sec x 3.  Discussed pressure releif.  Pt able to perfom wc boost, but maintains only 15 sec.  Instructed w/ant wt shift forearms on thighs and educated pt to repeat every 30 min x 2 min for adequate pressure relief.  Will need continued instruction w/this.  Pt returned to room and positioned w/needs/call bell in reach.  Requested to remain oob in wc.  Agreed to follow above pressure relief schedule.   Continues to demonstrate improvements in functional mobility and motor function.  Needs reinforcement w/pressure relief schedule/techniques.    Therapy Documentation Precautions:  Precautions Precautions: Fall Precaution Comments: paraplegic Restrictions Weight Bearing Restrictions: No PAIN 4/10 buttocks, treatment to tolerance   Therapy/Group: Individual Therapy  BarbCallie Fielding   07/18/2019, 4:22 PM

## 2019-07-18 NOTE — Progress Notes (Signed)
Social Work Patient ID: Frank Fuller, male   DOB: [birth year], 58 y.o.   MRN: [id number]   CSW met with pt and then with his brother via telephone to update them on team conference discussion and to confirm d/c date of 07-25-19.  Pt's brother to come for family education on 07-23-19.  Pt will only have intermittent assistance from brother and CSW is pursuing any aide resources pt can receive from [place].  CSW also refaxed information for ramp to [place].  CSW will f/u on these things next week.  Pt was in better spirits today and is able to work with therapies better.  CSW will continue to follow and assist as needed.

## 2019-07-19 ENCOUNTER — Inpatient Hospital Stay (HOSPITAL_COMMUNITY): Payer: Medicare Other | Admitting: Rehabilitation

## 2019-07-19 ENCOUNTER — Inpatient Hospital Stay (HOSPITAL_COMMUNITY): Payer: Medicare Other

## 2019-07-19 LAB — VANCOMYCIN, RANDOM: Vancomycin Rm: 27

## 2019-07-19 MED ORDER — HEPARIN SODIUM (PORCINE) 1000 UNIT/ML IJ SOLN
INTRAMUSCULAR | Status: AC
  Filled 2019-07-19: qty 4

## 2019-07-19 MED ORDER — VANCOMYCIN HCL IN DEXTROSE 750-5 MG/150ML-% IV SOLN
750.0000 mg | INTRAVENOUS | Status: AC
  Administered 2019-07-19 – 2019-07-26 (×4): 750 mg via INTRAVENOUS
  Filled 2019-07-19 (×4): qty 150

## 2019-07-19 MED ORDER — DARBEPOETIN ALFA 150 MCG/0.3ML IJ SOSY
PREFILLED_SYRINGE | INTRAMUSCULAR | Status: AC
  Administered 2019-07-19: 150 ug via INTRAVENOUS
  Filled 2019-07-19: qty 0.3

## 2019-07-19 NOTE — Progress Notes (Signed)
Occupational Therapy Session Note  Patient Details  Name: Frank Fuller MRN: [id number] Date of Birth: 1961-02-01  Today's Date: 07/19/2019 OT Individual Time: 1100-1200 OT Individual Time Calculation (min): 60 min    Short Term Goals: Week 1:  OT Short Term Goal 1 (Week 1): patient will complete functional transfers with SB CGA with good carryover of w/c set up OT Short Term Goal 1 - Progress (Week 1): Met OT Short Term Goal 2 (Week 1): patient will complete LB bathing and dressing with min A in safe position with ADs as needed OT Short Term Goal 2 - Progress (Week 1): Not met OT Short Term Goal 3 (Week 1): patient will tolerate OOB for 3 hours with good carryover of weight shifts OT Short Term Goal 3 - Progress (Week 1): Progressing toward goal  Skilled Therapeutic Interventions/Progress Updates:    1;1. Pt received in bed agreeable to OT already bathed and dressed with no pain. Pt completes squat pivot transfer with S and min VC for w/c parts management EOB<>w/c<>EOM. Pt propels w/c with MOD I to/from tx gym. Pt requesting to work on LE therex in prep for functional mobility and sit to stand to advance ADL performance level.  2x10 theres as follows  Sidelying: clamshells-min A for full ROM of hip abduciton Supine: bridging 2 sec isometric hold at top  Heel slides with mod manual resistance  SAQ-3# weights  Supine butterfly>adduction squeeze against gravity  Exited session with pt seated in w/c, call light in reach and all needs met    Therapy Documentation Precautions:  Precautions Precautions: Fall Precaution Comments: paraplegic Restrictions Weight Bearing Restrictions: No General:   Vital Signs:  Pain: Pain Assessment Pain Scale: 0-10 Pain Score: 0-No pain ADL: ADL Eating: Independent Where Assessed-Eating: Bed level Grooming: Setup, Supervision/safety Where Assessed-Grooming: Sitting at sink, Wheelchair Upper Body Dressing: Supervision/safety, Setup Where  Assessed-Upper Body Dressing: Wheelchair Lower Body Dressing: Maximal assistance Where Assessed-Lower Body Dressing: Bed level Toileting: Maximal assistance Where Assessed-Toileting: Bed level ADL Comments: not cleared for shower, incontinent of bowel requiring max A to clean up supine in bed Vision   Perception    Praxis   Exercises:   Other Treatments:     Therapy/Group: Individual Therapy  Tonny Branch 07/19/2019, 12:14 PM

## 2019-07-19 NOTE — Progress Notes (Signed)
Physical Therapy Session Note  Patient Details  Name: Frank Fuller MRN: [id number] Date of Birth: Aug 13, 1961  Today's Date: 07/19/2019 PT Individual Time: 0815-0857 PT Individual Time Calculation (min): 42 min  Missed 33 mins due to toileting and PT late  Short Term Goals: Week 1:  PT Short Term Goal 1 (Week 1): Pt will perform supine<>sit with CGA PT Short Term Goal 1 - Progress (Week 1): Met PT Short Term Goal 2 (Week 1): Pt will perform bed<>chair transfers with CGA PT Short Term Goal 2 - Progress (Week 1): Met PT Short Term Goal 3 (Week 1): Pt will initiate sit<>stand transfers and gait training PT Short Term Goal 3 - Progress (Week 1): Met PT Short Term Goal 4 (Week 1): Pt will demonstrate understanding of w/c parts with min cuing and min assist to set-up for bed<>chair transfers PT Short Term Goal 4 - Progress (Week 1): Met Week 2:  PT Short Term Goal 1 (Week 2): Pt will perform bed mobility at mod I level PT Short Term Goal 2 (Week 2): Pt will perform least restrictive transfers at Supervision consistently PT Short Term Goal 3 (Week 2): Pt will perform sit to stand with min A  Skilled Therapeutic Interventions/Progress Updates:   Pt received sitting in w/c in room, agreeable to therapy.  Pt not sure of therapy schedule today, therefore PT looked up schedule and wrote down so that pt would know.  Pt reports having dialysis later today.  Self propelled w/c x 300' with BUEs at S with min cues for improved technique.  Once in day room, transferred to nustep via squat pivot at S level.  Pt able to manage w/c parts at S level.  Performed seated nustep x 10 mins at level 4 resistance with BUEs/LEs for overall strength and endurance. Transferred back to w/c as before.  Self propelled to main therapy gym x another 150' at mod I level.  PT assisted pulling into // bars for standing exercises.  Performed 2 bouts of standing x 20-30 secs each with heavy UE support.  Utilized Geologist, engineering for visual  feedback on posture and improved LE activation/extension.  Note that he continues to fatigue quickly esp the RLE.  Upon second stand, pt reports having bowel movement, therefore pt propelled back to room x 150' as above.  Performed squat pivot transfer to bedside commode at CGA (due to longer distance to commode).  Pt pushed up with BUEs to allow PT to assist with clothing and brief management.  Pt requesting 10-15 mins in order to empty bowels (will reflect this in timed charge).  Pt performed peri care but needed assist pulling up brief and pants.  Transferred back to w/c at [facility] level.  Pt left in w/c with all needs in reach.    Therapy Documentation Precautions:  Precautions Precautions: Fall Precaution Comments: paraplegic Restrictions Weight Bearing Restrictions: No General:   Vital Signs: Therapy Vitals Temp: (!) 97.5 F (36.4 C) Temp Source: Oral Pulse Rate: 76 Resp: 18 BP: (!) 164/97 Patient Position (if appropriate): Lying Oxygen Therapy SpO2: 100 % O2 Device: Room Air Pain: 3/10 generalized pain, pt premedicated.    Therapy/Group: Individual Therapy  Denice Bors 07/19/2019, 8:24 AM

## 2019-07-19 NOTE — Progress Notes (Signed)
Pt last received enema on 9/22 @1837 . RN is aware GI provider's note to hold enema for 24 hrs, however pt is non-compliant with continuing bowel program. Pt LBM was 9/25 @2041 , it showed no traces of blood. RN educated pt on possible complications and need to develop a regular bowel program, however pt adamant on decision. Will continue to monitor.

## 2019-07-19 NOTE — Progress Notes (Signed)
[facility] PHYSICAL MEDICINE & REHABILITATION PROGRESS NOTE   Subjective/Complaints:   Pt without complaints. Sitting up in w/c. Dressed. Denies pain today  ROS: Patient denies fever, rash, sore throat, blurred vision, nausea, vomiting, diarrhea, cough, shortness of breath or chest pain,   headache, or mood change.     ROS: Denies CP, SOB, N/V/D  Objective:   No results found. Recent Labs    07/16/19 0956 07/17/19 0606  WBC 4.3 4.9  HGB 9.6* 9.5*  HCT 30.7* 29.6*  PLT 145* 149*   Recent Labs    07/17/19 0606  NA 134*  K 5.1  CL 96*  CO2 25  GLUCOSE 90  BUN 41*  CREATININE 9.20*  CALCIUM 9.3    Intake/Output Summary (Last 24 hours) at 07/19/2019 0843 Last data filed at 07/18/2019 1900 Gross per 24 hour  Intake 360 ml  Output -  Net 360 ml     Physical Exam: Vital Signs Blood pressure (!) 164/97, pulse 76, temperature (!) 97.5 F (36.4 C), temperature source Oral, resp. rate 18, weight 84.7 kg, SpO2 100 %. Constitutional: No distress . Vital signs reviewed. HEENT: EOMI, oral membranes moist Neck: supple Cardiovascular: RRR without murmur. No JVD    Respiratory: CTA Bilaterally without wheezes or rales. Normal effort    GI: BS +, non-tender, non-distended  Skin: Warm and dry.  Intact. Psych: appropriate and brighter affect; actually smiled throughout interview Musc: No edema in extremities.  No tenderness in extremities. Neurological: Alert Motor: Bilateral upper extremities: 5/5 proximal distal, unchanged Right lower extremity: Hip flexion, knee extension 2/5, ankle dorsiflexion 4/5, unchanged Left lower extremity: Hip flexion 2-/5, knee extension 2+/5, ankle dorsiflexion 4/5, unchanged  Assessment/Plan: 1. 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  Physiatrist is providing close team supervision and 24 hour management of  active medical problems listed below.  Physiatrist and rehab team continue to assess barriers to discharge/monitor patient progress toward functional and medical goals  Care Tool:  Bathing  Bathing activity did not occur: Refused Body parts bathed by patient: Right arm, Left arm, Chest, Abdomen, Front perineal area, Right upper leg, Left upper leg, Right lower leg, Left lower leg, Face, Buttocks   Body parts bathed by helper: Buttocks     Bathing assist Assist Level: Supervision/Verbal cueing(Bed level)     Upper Body Dressing/Undressing Upper body dressing   What is the patient wearing?: Pull over shirt    Upper body assist Assist Level: Set up assist    Lower Body Dressing/Undressing Lower body dressing      What is the patient wearing?: Pants, Incontinence brief     Lower body assist Assist for lower body dressing: Minimal Assistance - Patient > 75%(Bed level)     Toileting Toileting    Toileting assist Assist for toileting: Total Assistance - Patient < 25%     Transfers Chair/bed transfer  Transfers assist     Chair/bed transfer assist level: Contact Guard/Touching assist     Locomotion Ambulation   Ambulation assist   Ambulation activity did not occur: Safety/medical concerns          Walk 10 feet activity   Assist  Walk 10 feet activity did not occur: Safety/medical concerns        Walk 50 feet activity   Assist Walk 50 feet with 2 turns activity did not occur: Safety/medical concerns  Walk 150 feet activity   Assist Walk 150 feet activity did not occur: Safety/medical concerns         Walk 10 feet on uneven surface  activity   Assist Walk 10 feet on uneven surfaces activity did not occur: Safety/medical concerns         Wheelchair     Assist Will patient use wheelchair at discharge?: Yes Type of Wheelchair: Manual    Wheelchair assist level: Independent Max wheelchair distance: 150    Wheelchair  50 feet with 2 turns activity    Assist        Assist Level: Independent   Wheelchair 150 feet activity     Assist  Wheelchair 150 feet activity did not occur: Safety/medical concerns   Assist Level: Independent   Blood pressure (!) 164/97, pulse 76, temperature (!) 97.5 F (36.4 C), temperature source Oral, resp. rate 18, weight 84.7 kg, SpO2 100 %.  Medical Problem List and Plan: 1.  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   Continue CIR 2.  Antithrombotics:  -DVT/anticoagulation: SCDs.  LE vascular study limited, but negative for DVT.             -antiplatelet therapy: Low-dose aspirin 81 mg daily 3. Pain Management:   Hydrocodone q 6 h prn increase to every 4 hours as needed, increased to 5-10/325 every 4 hours as needed on 9/16  Added oxy CR at noc low dose, monitor for nausea              -prn tylenol             -prn robaxin for spasms  Added tucks QID and lidocaine QID  Gabapentin 100 nightly started on 9/17, increased to 200 on 9/18 with good improvement for neuropathic pain  9/23- will try lyrica- 25 mg BID- gabapentin making him sleepy- will wean Gabapentin once on higher dose of Lyrica.   9/24- pain somewhat better this AM  9/25- Lyrica really helping- pain down to 3/10- will con't for now and try to reduce Gabapentin next week. Will increase Lyrica to 50 mg BID to cover Gabapentin dose and stop Gabapentin on Monday.   9/26--pt reports pain is under reasonable control today 4. Mood: Provide emotional support. Pt somewhat disheartened by multiple medical setbacks.              -antipsychotic agents: N/A 5. Neuropsych: This patient is capable of making decisions on his own behalf. 6. Skin/Wound Care: Routine skin checks 7. Fluids/Electrolytes/Nutrition: Routine in and outs 8. ID: Continue vancomycin as well as Maxipime through 07/29/2019 per infectious  disease  Random level WNL on 9/19 9. Rectal ulcers/H. pylori.  Underwent EGD and flexible sigmoidoscopy 06/24/2019.  Follow-up per GI services.  Received Carafate enema x2 weeks as well as Flagyl x14 days initiated 06/26/2019 and tetracycline x14 days, completed course on 9/17.  -GI following and have discussed with pt re: continuing enemas  -continue to follow hgbs and if any significant bleeding recurs he will need flex sig per Dr. Doyne Keel note  Hemoglobin 9.1 on 9/19  9/23- ordered labs for now and tomorrow- pt reports "pool of blood" from carafate enema last night.   Continue to monitor  9/24- Hb stable- wil avoid enema x 24 hours per GI-  Will con't to attempt to do bowel program  9/25- discussed need to con't bowel program- will check with nursing that they are doing- explained  it takes 3-6 weeks to train gut.  9/26--continue bowel program per primary team 10.  Acute on chronic normocytic anemia.  Patient did receive 2 units packed red blood cells 06/23/2019 and 1 unit PRBC 07/01/2019 and remains on Aranesp.    See #9. 11.  End-stage renal disease.  Continue hemodialysis per renal services.             -HD after therapies to maximize rehab participation 12.  Thoracic aortic dissection.  Status post endovascular stent placement 06/06/2019.  Follow-up per Dr. Oneida Alar 13.  Neurogenic bowel/ bladder. I's and O's reviewed.   -He is essentially anuric.    -We will have patient sit up after administration for bowel movement, nursing to check for impaction  14.  Hypertension as well as history of first-degree AV block.  Norvasc 10 mg nightly, hydralazine 100 mg every 8 hours, labetalol 300 mg twice daily.  Monitor with increased mobility in therapies.   Spironolactone 25 daily started on 9/13  Losartan 100 daily started on 9/18 per Nephro recs  Remains extremely elevated on 9/20 with hypertensive crisis, however patient missed several doses of medications yesterday.  Will need to be more diligent  about administering medications, particularly given #12  Need to have better blood pressure control  9/21- BP 180-190/90s still- will d/w Nephrology  9/22- will add Clonidine #2 patch- not many other choices and recommended by Nephrology. However per chart, clonidine causes dizziness- will speak with pt prior.  9/23- per pt, CANNOT tolerate Clonidine- will d/w Nephrology.  9/24- BP better in 160s/90s was 170s-190/110s  9/26--better control, on near-max/max dose of a few meds Vitals:   07/18/19 2157 07/19/19 0458  BP: (!) 180/97 (!) 164/97  Pulse: 77 76  Resp:  18  Temp:  (!) 97.5 F (36.4 C)  SpO2:  100%  15.  Hyperlipidemia.  Lipitor 16.  Tobacco abuse.  Counseling 17.  Thrombocytopenia  Platelets 141 on 9/19  Continue to monitor 18.  Hyperglycemia  Elevated on 9/19, continue to monitor 19. Thrush  9/21- will start Diflucan and dose in evening; and Magic Mouthwash  9/22- says doing better   LOS: 15 days A FACE TO FACE EVALUATION WAS PERFORMED  [name] 07/19/2019, 8:43 AM

## 2019-07-19 NOTE — Progress Notes (Signed)
Pharmacy Antibiotic Note  Frank Fuller is a 58 y.o. male admitted on 07/04/2019.  Pharmacy has been consulted for Vanco/Cefepime dosing for vertebral osteomyelitis/discitis until 10/6 per ID recommendations. WBC is wnl. Patient remains afebrile.  ESRD - Patient remains on HD TTS, on schedule and tolerating full sessions.   8/23 BCx >> neg 8/25 IR disc aspirate >> neg 9/10 BCx - neg  Pre-HD vancomycin random is supratherapeutic at 27 today (goal 15-25). Levels have been trending up during therapy.  Plan: -Reduce vancomycin to 750mg  IV qHD TTS -Cefepime 2g IV qTTS at 1800 -Pre-HD vancomycin level weekly -Monitor HD schedule/tolerance inpatient -LOT - 6 weeks per ID   Weight: 186 lb 11.7 oz (84.7 kg)  Temp (24hrs), Avg:98.4 F (36.9 C), Min:97.5 F (36.4 C), Max:99.1 F (37.3 C)  Recent Labs  Lab 07/12/19 1357 07/15/19 1411 07/16/19 0956 07/17/19 0606 07/19/19 0548  WBC 4.8 4.4 4.3 4.9  --   CREATININE 9.17* 12.10*  --  9.20*  --   VANCORANDOM  --   --   --   --  27    Estimated Creatinine Clearance: 10.2 mL/min (A) (by C-G formula based on SCr of 9.2 mg/dL (H)).    Allergies  Allergen Reactions  . Oxycodone Nausea Only  . Pramoxine     Other reaction(s): Skin Rash  . Chlorhexidine Other (See Comments)    Unknown reaction Patch skin test done at dialysis 06/26/17  - staff using clear dressing and alcohol to clean exit site of catheter  . Clonidine Derivatives Other (See Comments)    Dizziness   . Fluorouracil     Other reaction(s): Skin Rash  . Lisinopril Other (See Comments)    unresponsive  . Sensipar  [Cinacalcet Hcl]     Other reaction(s): agitation  . Carvedilol Rash    Elicia Lamp, PharmD, BCPS Please check AMION for all [place] contact numbers Clinical Pharmacist 07/19/2019 1:56 PM

## 2019-07-19 NOTE — Procedures (Signed)
   I was present at this dialysis session, have reviewed the session itself and made  appropriate changes [name] MD [place] pager [phone]   07/19/2019, 4:27 PM

## 2019-07-20 ENCOUNTER — Inpatient Hospital Stay (HOSPITAL_COMMUNITY): Payer: Medicare Other

## 2019-07-20 ENCOUNTER — Inpatient Hospital Stay (HOSPITAL_COMMUNITY): Payer: Medicare Other | Admitting: Physical Therapy

## 2019-07-20 NOTE — Progress Notes (Signed)
Therapy successful after patient given time to stand at sink and empty bowels prior to session. Will hang note in room and report of to next staff, encouraged patient to remind staff for daily pre-therapy morning toileting.

## 2019-07-20 NOTE — Progress Notes (Signed)
Occupational Therapy Session Note  Patient Details  Name: Frank Fuller MRN: [id number] Date of Birth: 10/03/1961  Today's Date: 07/20/2019 OT Individual Time: 1100-1200 OT Individual Time Calculation (min): 60 min    Short Term Goals: Week 1:  OT Short Term Goal 1 (Week 1): patient will complete functional transfers with SB CGA with good carryover of w/c set up OT Short Term Goal 1 - Progress (Week 1): Met OT Short Term Goal 2 (Week 1): patient will complete LB bathing and dressing with min A in safe position with ADs as needed OT Short Term Goal 2 - Progress (Week 1): Not met OT Short Term Goal 3 (Week 1): patient will tolerate OOB for 3 hours with good carryover of weight shifts OT Short Term Goal 3 - Progress (Week 1): Progressing toward goal  Skilled Therapeutic Interventions/Progress Updates:    1:1. Pt received in bed asleep with time to arouse. Pt denies pain and agreeable to tx. Pt completes all squat pivot transfer with S and min VC for leg rest management prior to getting too close to surfaces EOB<>w/c<>EOM<>DAC. Pt completes mini squats from supported mid range in standing frame 3x10 to improve glute strength and activation required for terminal hip extension in standing. Pt sits EOM with no LE support on floor and 3#wrist weights on wrists to complete ball toss/wedge sit up combination for BUE and core strengthening required for dynamic sitting balane during ADLs (chest, bounce and overhead pass). Pt reporting need to use DAC for BM. Pt lifts up into tricep push up off or DAC arm rests for OT to manage clothing. When asked what he would do at home if he did not have A for CM, pt reports he hasnt practiced. Ot encouraged pt to problem solve in OT session tomorrow. Exited session with pt seated in bed, exit alarm on and call light in reach  Therapy Documentation Precautions:  Precautions Precautions: Fall Precaution Comments: paraplegic Restrictions Weight Bearing Restrictions:  No General:   Vital Signs: Therapy Vitals Pulse Rate: 71 BP: (!) 156/91 Pain: Pain Assessment Pain Scale: 0-10 Pain Score: 0-No pain Pain Type: Acute pain Pain Location: Buttocks Pain Descriptors / Indicators: Burning;Aching Pain Frequency: Intermittent Pain Intervention(s): Medication (See eMAR) ADL: ADL Eating: Independent Where Assessed-Eating: Bed level Grooming: Setup, Supervision/safety Where Assessed-Grooming: Sitting at sink, Wheelchair Upper Body Dressing: Supervision/safety, Setup Where Assessed-Upper Body Dressing: Wheelchair Lower Body Dressing: Maximal assistance Where Assessed-Lower Body Dressing: Bed level Toileting: Maximal assistance Where Assessed-Toileting: Bed level ADL Comments: not cleared for shower, incontinent of bowel requiring max A to clean up supine in bed Vision   Perception    Praxis   Exercises:   Other Treatments:     Therapy/Group: Individual Therapy  Tonny Branch 07/20/2019, 12:05 PM

## 2019-07-20 NOTE — Progress Notes (Signed)
Physical Therapy Session Note  Patient Details  Name: Frank Fuller MRN: [id number] Date of Birth: 1961-04-09  Today's Date: 07/20/2019 PT Individual Time: 0800-0900 PT Individual Time Calculation (min): 60 min   Short Term Goals: Week 1:  PT Short Term Goal 1 (Week 1): Pt will perform supine<>sit with CGA PT Short Term Goal 1 - Progress (Week 1): Met PT Short Term Goal 2 (Week 1): Pt will perform bed<>chair transfers with CGA PT Short Term Goal 2 - Progress (Week 1): Met PT Short Term Goal 3 (Week 1): Pt will initiate sit<>stand transfers and gait training PT Short Term Goal 3 - Progress (Week 1): Met PT Short Term Goal 4 (Week 1): Pt will demonstrate understanding of w/c parts with min cuing and min assist to set-up for bed<>chair transfers PT Short Term Goal 4 - Progress (Week 1): Met  Skilled Therapeutic Interventions/Progress Updates:  Pt was seen bedside in the am. Pt transferred supine to edge of bed with S. Pt transferred edge of bed to w/c with squat pivot transfer and S. Pt propelled w/c about 200 feet with B UEs, Ily with increased time. Pt transferred w/c to Nu-step, squat pivot with S and verbal cues. Pt rode Nu-step x 10 minutes with B UE and LEs at level 4. Pt transferred Nu-step to w/c with squat pivot and S. Pt propelled w/c to gym with B UEs and increased time. Pt stood x 2 in parallel bars with min A and verbal cues. Pt standing tolerance about 20 to 30 seconds with min A and verbal cues. Pt performed arm chair push-ups 3 sets x 10 reps each. Pt performed B hip flex and LAQs, 3 sets x 5 reps each. Pt propelled w/c back to room with B UEs and increased time. Pt transferred w.c to edge of bed with S. Pt transferred edge of bed to supine with S. Pt left sitting up in bed with call bell within reach and bed alarm on.   Therapy Documentation Precautions:  Precautions Precautions: Fall Precaution Comments: paraplegic Restrictions Weight Bearing Restrictions: No General:    Pain: Pt c/o 5/10 buttock pain.   Therapy/Group: Individual Therapy  Dub Amis 07/20/2019, 12:01 PM

## 2019-07-20 NOTE — Progress Notes (Signed)
[facility] PHYSICAL MEDICINE & REHABILITATION PROGRESS NOTE   Subjective/Complaints:   Up in w/c, dressed. Denies any pain. "I feel good"   ROS: Patient denies fever, rash, sore throat, blurred vision, nausea, vomiting, diarrhea, cough, shortness of breath or chest pain, joint or back pain, headache, or mood change.   Objective:   No results found. No results for input(s): WBC, HGB, HCT, PLT in the last 72 hours. No results for input(s): NA, K, CL, CO2, GLUCOSE, BUN, CREATININE, CALCIUM in the last 72 hours.  Intake/Output Summary (Last 24 hours) at 07/20/2019 1000 Last data filed at 07/20/2019 0500 Gross per 24 hour  Intake 702.38 ml  Output 2000 ml  Net -1297.62 ml     Physical Exam: Vital Signs Blood pressure (!) 156/91, pulse 71, temperature 97.8 F (36.6 C), temperature source Oral, resp. rate 18, weight 85 kg, SpO2 100 %. Constitutional: No distress . Vital signs reviewed. HEENT: EOMI, oral membranes moist Neck: supple Cardiovascular: RRR without murmur. No JVD    Respiratory: CTA Bilaterally without wheezes or rales. Normal effort    GI: BS +, non-tender, non-distended  Skin: Warm and dry.  Intact. Psych: appropriate and brighter affect; actually smiled throughout interview Musc: No edema in extremities.  No tenderness in extremities. Neurological: Alert Motor: Bilateral upper extremities: 5/5 proximal distal, unchanged Right lower extremity: Hip flexion, knee extension 2/5, ankle dorsiflexion 4/5, no changes Left lower extremity: Hip flexion 2-/5, knee extension 2+/5, ankle dorsiflexion 4/5, no changes  Assessment/Plan: 1. 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  Physiatrist is providing close team supervision and 24 hour management of active medical problems listed below.  Physiatrist and rehab team continue to assess barriers  to discharge/monitor patient progress toward functional and medical goals  Care Tool:  Bathing  Bathing activity did not occur: Refused Body parts bathed by patient: Right arm, Left arm, Chest, Abdomen, Front perineal area, Right upper leg, Left upper leg, Right lower leg, Left lower leg, Face, Buttocks   Body parts bathed by helper: Buttocks     Bathing assist Assist Level: Supervision/Verbal cueing(Bed level)     Upper Body Dressing/Undressing Upper body dressing   What is the patient wearing?: Pull over shirt    Upper body assist Assist Level: Set up assist    Lower Body Dressing/Undressing Lower body dressing      What is the patient wearing?: Pants, Incontinence brief     Lower body assist Assist for lower body dressing: Minimal Assistance - Patient > 75%(Bed level)     Toileting Toileting    Toileting assist Assist for toileting: Maximal Assistance - Patient 25 - 49%     Transfers Chair/bed transfer  Transfers assist     Chair/bed transfer assist level: Supervision/Verbal cueing     Locomotion Ambulation   Ambulation assist   Ambulation activity did not occur: Safety/medical concerns          Walk 10 feet activity   Assist  Walk 10 feet activity did not occur: Safety/medical concerns        Walk 50 feet activity   Assist Walk 50 feet with 2 turns activity did not occur: Safety/medical concerns         Walk 150 feet activity   Assist Walk 150 feet activity did not occur: Safety/medical concerns         Walk 10 feet  on uneven surface  activity   Assist Walk 10 feet on uneven surfaces activity did not occur: Safety/medical concerns         Wheelchair     Assist Will patient use wheelchair at discharge?: Yes Type of Wheelchair: Manual    Wheelchair assist level: Independent Max wheelchair distance: 150    Wheelchair 50 feet with 2 turns activity    Assist        Assist Level: Independent    Wheelchair 150 feet activity     Assist  Wheelchair 150 feet activity did not occur: Safety/medical concerns   Assist Level: Independent   Blood pressure (!) 156/91, pulse 71, temperature 97.8 F (36.6 C), temperature source Oral, resp. rate 18, weight 85 kg, SpO2 100 %.  Medical Problem List and Plan: 1.  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   Continue CIR 2.  Antithrombotics:  -DVT/anticoagulation: SCDs.  LE vascular study limited, but negative for DVT.             -antiplatelet therapy: Low-dose aspirin 81 mg daily 3. Pain Management:   Hydrocodone q 6 h prn increase to every 4 hours as needed, increased to 5-10/325 every 4 hours as needed on 9/16  Added oxy CR at noc low dose, monitor for nausea              -prn tylenol             -prn robaxin for spasms  Added tucks QID and lidocaine QID  Gabapentin 100 nightly started on 9/17, increased to 200 on 9/18 with good improvement for neuropathic pain  9/23- will try lyrica- 25 mg BID- gabapentin making him sleepy- will wean Gabapentin once on higher dose of Lyrica.   9/24- pain somewhat better this AM  9/25- Lyrica really helping- pain down to 3/10- will con't for now and try to reduce Gabapentin next week. Will increase Lyrica to 50 mg BID to cover Gabapentin dose and stop Gabapentin on Monday.   9/27--pt reports pain is under good control today 4. Mood: Provide emotional support. Pt somewhat disheartened by multiple medical setbacks.              -antipsychotic agents: N/A 5. Neuropsych: This patient is capable of making decisions on his own behalf. 6. Skin/Wound Care: Routine skin checks 7. Fluids/Electrolytes/Nutrition: Routine in and outs 8. ID: Continue vancomycin as well as Maxipime through 07/29/2019 per infectious disease  Random level WNL on 9/19 9. Rectal ulcers/H. pylori.  Underwent EGD and flexible  sigmoidoscopy 06/24/2019.  Follow-up per GI services.  Received Carafate enema x2 weeks as well as Flagyl x14 days initiated 06/26/2019 and tetracycline x14 days, completed course on 9/17.  -GI following and have discussed with pt re: continuing enemas  -continue to follow hgbs and if any significant bleeding recurs he will need flex sig per Dr. Doyne Keel note  Hemoglobin 9.1 on 9/19  9/23- ordered labs for now and tomorrow- pt reports "pool of blood" from carafate enema last night.   Continue to monitor  9/24- Hb stable- wil avoid enema x 24 hours per GI-  Will con't to attempt to do bowel program  9/25- discussed need to con't bowel program- will check with nursing that they are doing- explained it takes 3-6 weeks to train gut.  9/27--continue bowel program per primary team 10.  Acute on chronic normocytic anemia.  Patient did receive 2 units packed  red blood cells 06/23/2019 and 1 unit PRBC 07/01/2019 and remains on Aranesp.    See #9. 11.  End-stage renal disease.  Continue hemodialysis per renal services.             -HD after therapies to maximize rehab participation 12.  Thoracic aortic dissection.  Status post endovascular stent placement 06/06/2019.  Follow-up per Dr. Oneida Alar 13.  Neurogenic bowel/ bladder. I's and O's reviewed.   -He is essentially anuric.    -We will have patient sit up after administration for bowel movement, nursing to check for impaction  -pt having regular incontinent bm's most days  -defer to primary team re: ongoing adjustment of bowel program 14.  Hypertension as well as history of first-degree AV block.  Norvasc 10 mg nightly, hydralazine 100 mg every 8 hours, labetalol 300 mg twice daily.  Monitor with increased mobility in therapies.   Spironolactone 25 daily started on 9/13  Losartan 100 daily started on 9/18 per Nephro recs  Remains extremely elevated on 9/20 with hypertensive crisis, however patient missed several doses of medications yesterday.  Will need to  be more diligent about administering medications, particularly given #12  Need to have better blood pressure control  9/21- BP 180-190/90s still- will d/w Nephrology  9/22- will add Clonidine #2 patch- not many other choices and recommended by Nephrology. However per chart, clonidine causes dizziness- will speak with pt prior.  9/23- per pt, CANNOT tolerate Clonidine- will d/w Nephrology.  9/24- BP better in 160s/90s was 170s-190/110s  9/27--better but still borderline/poor control, on near-max/max dose of a few meds Vitals:   07/20/19 0501 07/20/19 0924  BP: (!) 172/92 (!) 156/91  Pulse: 78 71  Resp: 18   Temp: 97.8 F (36.6 C)   SpO2: 100%   15.  Hyperlipidemia.  Lipitor 16.  Tobacco abuse.  Counseling 17.  Thrombocytopenia  Platelets 141 on 9/19  Continue to monitor 18.  Hyperglycemia  Elevated on 9/19, continue to monitor 19. Thrush  9/21- will start Diflucan and dose in evening; and Magic Mouthwash  9/22- says doing better   LOS: 16 days A FACE TO [place] 07/20/2019, 10:00 AM

## 2019-07-20 NOTE — Progress Notes (Signed)
Per therapy, RN to have pt stand up at sink to properly empty bowels, which pt agreed to. Pt also compliant w/ administration of enema this morning. Pt stated he wanted to resolve his bowel incontinence. Will continue to educate and reinforce need to adhere w/ bowel program.

## 2019-07-21 ENCOUNTER — Inpatient Hospital Stay (HOSPITAL_COMMUNITY): Payer: Medicare Other | Admitting: Physical Therapy

## 2019-07-21 ENCOUNTER — Inpatient Hospital Stay (HOSPITAL_COMMUNITY): Payer: Medicare Other | Admitting: Occupational Therapy

## 2019-07-21 ENCOUNTER — Inpatient Hospital Stay (HOSPITAL_COMMUNITY): Payer: Medicare Other

## 2019-07-21 LAB — CBC WITH DIFFERENTIAL/PLATELET
Abs Immature Granulocytes: 0.03 10*3/uL (ref 0.00–0.07)
Basophils Absolute: 0 10*3/uL (ref 0.0–0.1)
Basophils Relative: 0 %
Eosinophils Absolute: 0.1 10*3/uL (ref 0.0–0.5)
Eosinophils Relative: 3 %
HCT: 32 % — ABNORMAL LOW (ref 39.0–52.0)
Hemoglobin: 9.9 g/dL — ABNORMAL LOW (ref 13.0–17.0)
Immature Granulocytes: 1 %
Lymphocytes Relative: 24 %
Lymphs Abs: 1.1 10*3/uL (ref 0.7–4.0)
MCH: 29.6 pg (ref 26.0–34.0)
MCHC: 30.9 g/dL (ref 30.0–36.0)
MCV: 95.5 fL (ref 80.0–100.0)
Monocytes Absolute: 0.8 10*3/uL (ref 0.1–1.0)
Monocytes Relative: 17 %
Neutro Abs: 2.7 10*3/uL (ref 1.7–7.7)
Neutrophils Relative %: 55 %
Platelets: 168 10*3/uL (ref 150–400)
RBC: 3.35 MIL/uL — ABNORMAL LOW (ref 4.22–5.81)
RDW: 18.4 % — ABNORMAL HIGH (ref 11.5–15.5)
WBC: 4.9 10*3/uL (ref 4.0–10.5)
nRBC: 0 % (ref 0.0–0.2)

## 2019-07-21 LAB — RENAL FUNCTION PANEL
Albumin: 2.4 g/dL — ABNORMAL LOW (ref 3.5–5.0)
Anion gap: 15 (ref 5–15)
BUN: 34 mg/dL — ABNORMAL HIGH (ref 6–20)
CO2: 24 mmol/L (ref 22–32)
Calcium: 9.6 mg/dL (ref 8.9–10.3)
Chloride: 97 mmol/L — ABNORMAL LOW (ref 98–111)
Creatinine, Ser: 8.27 mg/dL — ABNORMAL HIGH (ref 0.61–1.24)
GFR calc Af Amer: 7 mL/min — ABNORMAL LOW (ref >60)
GFR calc non Af Amer: 6 mL/min — ABNORMAL LOW (ref >60)
Glucose, Bld: 91 mg/dL (ref 70–99)
Phosphorus: 5.3 mg/dL — ABNORMAL HIGH (ref 2.5–4.6)
Potassium: 5 mmol/L (ref 3.5–5.1)
Sodium: 136 mmol/L (ref 135–145)

## 2019-07-21 MED ORDER — SUCRALFATE 1 GM/10ML PO SUSP
2.0000 g | Freq: Two times a day (BID) | ORAL | Status: AC
  Filled 2019-07-21 (×4): qty 20

## 2019-07-21 MED ORDER — CLONIDINE HCL 0.1 MG PO TABS
0.1000 mg | ORAL_TABLET | Freq: Once | ORAL | Status: AC
  Administered 2019-07-21: 0.1 mg via ORAL
  Filled 2019-07-21: qty 1

## 2019-07-21 MED ORDER — CHLORHEXIDINE GLUCONATE CLOTH 2 % EX PADS: 6.0000 | MEDICATED_PAD | Freq: Every day | CUTANEOUS | Status: DC

## 2019-07-21 NOTE — Progress Notes (Signed)
Physical Therapy Weekly Progress Note  Patient Details  Name: Frank Fuller MRN: [id number] Date of Birth: [birth year]  Beginning of progress report period: July 12, 2019 End of progress report period: July 21, 2019  Today's Date: 07/21/2019 PT Individual Time: 0900-1000 PT Individual Time Calculation (min): 60 min   Patient has met 3 of 3 short term goals.  Pt continues to make great progress towards goals. Pt is at mod I level for bed mobility, Supervision for squat pivot transfers bed to/from w/c, mod I with w/c mobility, Supervision with min cueing for management of w/c parts, and min A to stand. Pt is unable to perform gait at this time due to ongoing LE weakness and decreased tolerance for standing.  Patient continues to demonstrate the following deficits muscle weakness, abnormal tone and unbalanced muscle activation and decreased sitting balance, decreased standing balance, decreased postural control and decreased balance strategies and therefore will continue to benefit from skilled PT intervention to increase functional independence with mobility.  Patient not progressing toward long term goals.  See goal revision..  Plan of care revisions: Discharged short distance gait goal due to slow progress..Otherwise pt is making good progress towards all other long-term goals.  PT Short Term Goals Week 2:  PT Short Term Goal 1 (Week 2): Pt will perform bed mobility at mod I level PT Short Term Goal 1 - Progress (Week 2): Met PT Short Term Goal 2 (Week 2): Pt will perform least restrictive transfers at Supervision consistently PT Short Term Goal 2 - Progress (Week 2): Met PT Short Term Goal 3 (Week 2): Pt will perform sit to stand with min A PT Short Term Goal 3 - Progress (Week 2): Met Week 3:  PT Short Term Goal 1 (Week 3): =LTG due to ELOS  Skilled Therapeutic Interventions/Progress Updates:    Pt received seated in bed, agreeable to PT session. No complaints of pain. Bed  mobility mod I with use of bedrail. Squat pivot transfer bed to w/c with Supervision. Pt is at mod I level for w/c mobility x 150 ft with use of BUE. Sit to stand x 4 reps in standing frame. Pt tends to stand with hips flexed and with lateral lean to the L, manual cues for equal WBing through BLE and tactile cues for hip extension. Pt is able to achieve upright standing posture but unable to maintain due to muscle weakness and fatigue. Standing mini-squats with decreased assist from standing frame sling, 2 x 10 reps. Pt exhibits decreased control of RLE and tends to lean to the L with mini-squats. Supine to/from sit at mod I level. Supine BLE strengthening therex: bridges 2 x 10 reps, SKFO x 10 reps B. Squat pivot transfer mat table to w/c with Supervision. Pt requires only min cueing for management of w/c parts. Manual w/c propulsion x 150 ft at mod I level. Pt left seated in w/c in room with needs in reach at end of session.  Therapy Documentation Precautions:  Precautions Precautions: Fall Precaution Comments: paraplegic Restrictions Weight Bearing Restrictions: No   Therapy/Group: Individual Therapy  Excell Seltzer, PT, DPT  07/21/2019, 12:49 PM

## 2019-07-21 NOTE — Plan of Care (Signed)
  Problem: Consults Goal: RH GENERAL PATIENT EDUCATION Description: See Patient Education module for education specifics. Outcome: Progressing   Problem: RH BOWEL ELIMINATION Goal: RH STG MANAGE BOWEL WITH ASSISTANCE Description: STG Manage Bowel with Assistance. mod Outcome: Progressing   Problem: RH SKIN INTEGRITY Goal: RH STG SKIN FREE OF INFECTION/BREAKDOWN Description: Remain free of breakdown with mod assist Outcome: Progressing Goal: RH STG MAINTAIN SKIN INTEGRITY WITH ASSISTANCE Description: STG Maintain Skin Integrity With Assistance. Mod Outcome: Progressing   Problem: RH SAFETY Goal: RH STG ADHERE TO SAFETY PRECAUTIONS W/ASSISTANCE/DEVICE Description: STG Adhere to Safety Precautions With Assistance/Device. mod Outcome: Progressing Goal: RH STG DECREASED RISK OF FALL WITH ASSISTANCE Description: STG Decreased Risk of Fall With Assistance. Adhere to safety precautions, will have no falls w/ mod assist Outcome: Progressing   Problem: RH PAIN MANAGEMENT Goal: RH STG PAIN MANAGED AT OR BELOW PT'S PAIN GOAL Description: Less than 3 Outcome: Progressing   Problem: RH KNOWLEDGE DEFICIT GENERAL Goal: RH STG INCREASE KNOWLEDGE OF SELF CARE AFTER HOSPITALIZATION Description: Patient will be able to describe self care at home following hospitalization with cues/reminders Outcome: Progressing

## 2019-07-21 NOTE — Plan of Care (Signed)
Discharged short distance gait goal due to slow progress.

## 2019-07-21 NOTE — Progress Notes (Signed)
Patient with paraplegia and sensory loss secondary to T10 spinal cord infarction after aortic dissection with associated lumbar discitis and osteomyelitis in stable but guarded condition.  Needs a K005 ultralight weight custom wheelchair.  A face-to-face was performed.

## 2019-07-21 NOTE — Plan of Care (Signed)
  Problem: RH Balance Goal: LTG Patient will maintain dynamic standing with ADLs (OT) Description: LTG:  Patient will maintain dynamic standing balance with assist during activities of daily living (OT)  Outcome: Not Applicable Flowsheets (Taken 07/21/2019 1249) LTG: Pt will maintain dynamic standing balance during ADLs with: (Goal d/c 9/28) -- Note: Goal d/c as not recommending standing at d/c. Lameshia Hypolite, OTR/L   Problem: Sit to Stand Goal: LTG:  Patient will perform sit to stand in prep for activites of daily living with assistance level (OT) Description: LTG:  Patient will perform sit to stand in prep for activites of daily living with assistance level (OT) Outcome: Not Applicable Flowsheets (Taken 07/21/2019 1249) LTG: PT will perform sit to stand in prep for activites of daily living with assistance level: (Goal d/c 9/28-AR) -- Note: Goal d/c as not recommending standing at d/c. Konnar Ben, OTR/L   Problem: RH Laundry Goal: LTG Patient will perform laundry w/assist, cues (OT) Description: LTG: Patient will perform laundry with assistance, with/without cues (OT). Outcome: Not Applicable Flowsheets (Taken 07/21/2019 1249) LTG: Pt will perform laundry with assistance level of: (Goal d/c 9/28) -- Note: Goal d/c as pt's laundry room not accessible from w/c. Sherwin Hollingshed, OTR/L

## 2019-07-21 NOTE — Progress Notes (Signed)
Occupational Therapy Session Note  Patient Details  Name: Frank Fuller MRN: [id number] Date of Birth: 15-Nov-1960  Today's Date: 07/21/2019 OT Individual Time: [id number] OT Individual Time Calculation (min): 70 min    Short Term Goals: Week 3:  OT Short Term Goal 1 (Week 3): STG=LTG due to LOS  Skilled Therapeutic Interventions/Progress Updates:    Pt seen for OT session focusing on functional transfers and LE strengthening. Pt sitting up in w/c upon arrival, agreeable to tx session and denying pain. Reports having washed up this AM and declining bathing/dressing routine.  Squat pivot transfers completed throughout session to various heights/surfaces including BSC, standard bed, and therapy mat with supervision. Min VCs throughout from proper set-up of w/c in prep for transfer with good carryover of education.  Addressed toileting task and transfers. Transitioned to [facility]. With supervision/VCs for problem solving/technique and guarding assist, pt completed clothing management and simulated hygiene. Although able to get pants up/down, discussed transferring to  [facility] with just brief or nothing and then returning to bed to complete clothing management for energy conservation method, pt in agreement.  In ADL apartment, completed bed mobility on standard bed. Pt demonstrates ability to completed all bed mobility mod I to R and L side of bed.  In therapy gym, completed supine there- ex as follows:   x2 sets of 5 heel slides on R/L  x10 glute bridges  x5 hip rotations R/L Pt returned to room at end of session, opting to stay up in w/c. Left with all needs in reach and safety belt alarm on.   Therapy Documentation Precautions:  Precautions Precautions: Fall Precaution Comments: paraplegic Restrictions Weight Bearing Restrictions: No   Therapy/Group: Individual Therapy  Arti Trang L 07/21/2019, 7:13 AM

## 2019-07-21 NOTE — Progress Notes (Signed)
Patient c/o sob. Vitals reveled manual BP of 180/105, contacted Dr.Swartz who gave a one time order of Clonidine 0.1 po. Contacted James in Pharmacy d/t possible allergy and he stated a one time dose would be ok. Will continue to monitor.

## 2019-07-21 NOTE — Plan of Care (Deleted)
  Problem: RH Balance Goal: LTG Patient will maintain dynamic standing with ADLs (OT) Description: LTG:  Patient will maintain dynamic standing balance with assist during activities of daily living (OT)  Outcome: Not Applicable Flowsheets (Taken 07/21/2019 1249) LTG: Pt will maintain dynamic standing balance during ADLs with: (Goal d/c 9/28) -- Note: Goal d/c as not recommending standing at d/c. Kionna Brier, OTR/L

## 2019-07-21 NOTE — Progress Notes (Addendum)
[place] PHYSICAL MEDICINE & REHABILITATION PROGRESS NOTE   Subjective/Complaints:   Reports wants [place] to get in touch with PCP at [place], [place] about getting ramp and depends for home.  Is interested in going to [place] in [place] "for a few weeks".  Pain 2-3/10 rectal pain- minimal- likes the pain control currently. Michela Pitcher "almost walked" yesterday- stood with w/c in own room- not with therapy yesterday- with brother in room.    ROS: Patient denies fever, rash, sore throat, blurred vision, nausea, vomiting, diarrhea, cough, shortness of breath or chest pain, joint or back pain, headache, or mood change.   Objective:   No results found. Recent Labs    07/21/19 0548  WBC 4.9  HGB 9.9*  HCT 32.0*  PLT 168   Recent Labs    07/21/19 0548  NA 136  K 5.0  CL 97*  CO2 24  GLUCOSE 91  BUN 34*  CREATININE 8.27*  CALCIUM 9.6    Intake/Output Summary (Last 24 hours) at 07/21/2019 1122 Last data filed at 07/21/2019 0814 Gross per 24 hour  Intake 500 ml  Output -  Net 500 ml     Physical Exam: Vital Signs Blood pressure (!) 145/95, pulse 79, temperature 97.7 F (36.5 C), temperature source Oral, resp. rate 18, weight 85 kg, SpO2 97 %. Constitutional: No distress . Vital signs and labs reviewed. Awake, alert, appropriate, lying comfortablly in bed; much more talkative today; brighter affect, NAD HEENT: EOMI, oral membranes moist Neck: supple Cardiovascular: RRR without murmur.     Respiratory: CTA Bilaterally without wheezes or rales. Normal effort    GI: BS +, non-tender, non-distended  Skin: Warm and dry.  Intact. Psych: appropriate and brighter affect; actually smiled throughout interview Musc: No edema in extremities.  No tenderness in extremities. Neurological: Alert Motor: Bilateral upper extremities: 5/5 proximal distal, unchanged Right lower extremity: Hip flexion, knee extension 2/5, ankle dorsiflexion 4/5, no changes Left lower extremity: Hip flexion 2-/5, knee  extension 2+/5, ankle dorsiflexion 4/5, no changes  Assessment/Plan: 1. 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  Physiatrist is providing close team supervision and 24 hour management of active medical problems listed below.  Physiatrist and rehab team continue to assess barriers to discharge/monitor patient progress toward functional and medical goals  Care Tool:  Bathing  Bathing activity did not occur: Refused Body parts bathed by patient: Right arm, Left arm, Chest, Abdomen, Front perineal area, Right upper leg, Left upper leg, Right lower leg, Left lower leg, Face, Buttocks   Body parts bathed by helper: Buttocks     Bathing assist Assist Level: Supervision/Verbal cueing(Bed level)     Upper Body Dressing/Undressing Upper body dressing   What is the patient wearing?: Pull over shirt    Upper body assist Assist Level: Set up assist    Lower Body Dressing/Undressing Lower body dressing      What is the patient wearing?: Pants, Incontinence brief     Lower body assist Assist for lower body dressing: Minimal Assistance - Patient > 75%(Bed level)     Toileting Toileting    Toileting assist Assist for toileting: Maximal Assistance - Patient 25 - 49%     Transfers Chair/bed transfer  Transfers assist     Chair/bed transfer assist level: Supervision/Verbal cueing     Locomotion Ambulation   Ambulation assist   Ambulation activity did not  occur: Safety/medical concerns          Walk 10 feet activity   Assist  Walk 10 feet activity did not occur: Safety/medical concerns        Walk 50 feet activity   Assist Walk 50 feet with 2 turns activity did not occur: Safety/medical concerns         Walk 150 feet activity   Assist Walk 150 feet activity did not occur: Safety/medical concerns         Walk 10  feet on uneven surface  activity   Assist Walk 10 feet on uneven surfaces activity did not occur: Safety/medical concerns         Wheelchair     Assist Will patient use wheelchair at discharge?: Yes Type of Wheelchair: Manual    Wheelchair assist level: Independent Max wheelchair distance: 200    Wheelchair 50 feet with 2 turns activity    Assist        Assist Level: Independent   Wheelchair 150 feet activity     Assist  Wheelchair 150 feet activity did not occur: Safety/medical concerns   Assist Level: Independent   Blood pressure (!) 145/95, pulse 79, temperature 97.7 F (36.5 C), temperature source Oral, resp. rate 18, weight 85 kg, SpO2 97 %.  Medical Problem List and Plan: 1.  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   Continue CIR 2.  Antithrombotics:  -DVT/anticoagulation: SCDs.  LE vascular study limited, but negative for DVT.             -antiplatelet therapy: Low-dose aspirin 81 mg daily 3. Pain Management:   Hydrocodone q 6 h prn increase to every 4 hours as needed, increased to 5-10/325 every 4 hours as needed on 9/16  Added oxy CR at noc low dose, monitor for nausea              -prn tylenol             -prn robaxin for spasms  Added tucks QID and lidocaine QID  Gabapentin 100 nightly started on 9/17, increased to 200 on 9/18 with good improvement for neuropathic pain  9/23- will try lyrica- 25 mg BID- gabapentin making him sleepy- will wean Gabapentin once on higher dose of Lyrica.   9/24- pain somewhat better this AM  9/25- Lyrica really helping- pain down to 3/10- will con't for now and try to reduce Gabapentin next week. Will increase Lyrica to 50 mg BID to cover Gabapentin dose and stop Gabapentin on Monday.   9/28- d/c'd Gabapentin today- hopefully will not affect pain.   9/27--pt reports pain is under good control today 4.  Mood: Provide emotional support. Pt somewhat disheartened by multiple medical setbacks.  9/28- brighter affect, esp about going to [facility].               -antipsychotic agents: N/A 5. Neuropsych: This patient is capable of making decisions on his own behalf. 6. Skin/Wound Care: Routine skin checks 7. Fluids/Electrolytes/Nutrition: Routine in and outs 8. ID: Continue vancomycin as well as Maxipime through 07/29/2019 per infectious disease  Random level WNL on 9/19 9. Rectal ulcers/H. pylori.  Underwent EGD and flexible sigmoidoscopy 06/24/2019.  Follow-up per GI services.  Received Carafate enema x2 weeks as well as Flagyl x14 days initiated 06/26/2019 and tetracycline x14 days, completed course on 9/17.  -GI following and have discussed with pt re: continuing enemas  -  continue to follow hgbs and if any significant bleeding recurs he will need flex sig per Dr. Doyne Keel note  Hemoglobin 9.1 on 9/19  9/23- ordered labs for now and tomorrow- pt reports "pool of blood" from carafate enema last night.   Continue to monitor  9/24- Hb stable- wil avoid enema x 24 hours per GI-  Will con't to attempt to do bowel program  9/25- discussed need to con't bowel program- will check with nursing that they are doing- explained it takes 3-6 weeks to train gut.  9/27--continue bowel program per primary team 10.  Acute on chronic normocytic anemia.  Patient did receive 2 units packed red blood cells 06/23/2019 and 1 unit PRBC 07/01/2019 and remains on Aranesp.    See #9. 11.  End-stage renal disease.  Continue hemodialysis per renal services.             -HD after therapies to maximize rehab participation 12.  Thoracic aortic dissection.  Status post endovascular stent placement 06/06/2019.  Follow-up per Dr. Oneida Alar 13.  Neurogenic bowel/ bladder. I's and O's reviewed.   -He is essentially anuric.    -We will have patient sit up after administration for bowel movement, nursing to check for impaction  -pt  having regular incontinent bm's most days  -defer to primary team re: ongoing adjustment of bowel program 14.  Hypertension as well as history of first-degree AV block.  Norvasc 10 mg nightly, hydralazine 100 mg every 8 hours, labetalol 300 mg twice daily.  Monitor with increased mobility in therapies.   Spironolactone 25 daily started on 9/13  Losartan 100 daily started on 9/18 per Nephro recs  Remains extremely elevated on 9/20 with hypertensive crisis, however patient missed several doses of medications yesterday.  Will need to be more diligent about administering medications, particularly given #12  Need to have better blood pressure control  9/21- BP 180-190/90s still- will d/w Nephrology  9/22- will add Clonidine #2 patch- not many other choices and recommended by Nephrology. However per chart, clonidine causes dizziness- will speak with pt prior.  9/23- per pt, CANNOT tolerate Clonidine- will d/w Nephrology.  9/24- BP better in 160s/90s was 170s-190/110s  9/27--better but still borderline/poor control, on near-max/max dose of a few meds  9/28- 145/95- much better Vitals:   07/21/19 0506 07/21/19 0507  BP: (!) 145/95 (!) 145/95  Pulse:  79  Resp:  18  Temp:  97.7 F (36.5 C)  SpO2:  97%  15.  Hyperlipidemia.  Lipitor 16.  Tobacco abuse.  Counseling 17.  Thrombocytopenia  Platelets 141 on 9/19  Continue to monitor 18.  Hyperglycemia  Elevated on 9/19, continue to monitor 19. Thrush  9/21- will start Diflucan and dose in evening; and Magic Mouthwash  9/22- says doing better  20- Dispo  9/28- calling pt's PCP at [phone] Dr. Clare Charon. To ask about ramp and about Depends per pt request. Have left message for PCP to call me back- left my number. - he has ordered ramp for pt and will order Depends- fax d/c summary, etc to [phone]   LOS: 17 days A FACE TO FACE EVALUATION WAS PERFORMED  Kynzley Dowson 07/21/2019, 11:22 AM

## 2019-07-22 ENCOUNTER — Inpatient Hospital Stay (HOSPITAL_COMMUNITY): Payer: Medicare Other | Admitting: Occupational Therapy

## 2019-07-22 ENCOUNTER — Inpatient Hospital Stay (HOSPITAL_COMMUNITY): Payer: Medicare Other | Admitting: Physical Therapy

## 2019-07-22 LAB — RENAL FUNCTION PANEL
Albumin: 2.3 g/dL — ABNORMAL LOW (ref 3.5–5.0)
Anion gap: 16 — ABNORMAL HIGH (ref 5–15)
BUN: 47 mg/dL — ABNORMAL HIGH (ref 6–20)
CO2: 21 mmol/L — ABNORMAL LOW (ref 22–32)
Calcium: 9.2 mg/dL (ref 8.9–10.3)
Chloride: 98 mmol/L (ref 98–111)
Creatinine, Ser: 10.02 mg/dL — ABNORMAL HIGH (ref 0.61–1.24)
GFR calc Af Amer: 6 mL/min — ABNORMAL LOW (ref >60)
GFR calc non Af Amer: 5 mL/min — ABNORMAL LOW (ref >60)
Glucose, Bld: 89 mg/dL (ref 70–99)
Phosphorus: 5.6 mg/dL — ABNORMAL HIGH (ref 2.5–4.6)
Potassium: 5.4 mmol/L — ABNORMAL HIGH (ref 3.5–5.1)
Sodium: 135 mmol/L (ref 135–145)

## 2019-07-22 LAB — CBC
HCT: 27.3 % — ABNORMAL LOW (ref 39.0–52.0)
Hemoglobin: 8.5 g/dL — ABNORMAL LOW (ref 13.0–17.0)
MCH: 30.6 pg (ref 26.0–34.0)
MCHC: 31.1 g/dL (ref 30.0–36.0)
MCV: 98.2 fL (ref 80.0–100.0)
Platelets: 162 10*3/uL (ref 150–400)
RBC: 2.78 MIL/uL — ABNORMAL LOW (ref 4.22–5.81)
RDW: 18.6 % — ABNORMAL HIGH (ref 11.5–15.5)
WBC: 4.7 10*3/uL (ref 4.0–10.5)
nRBC: 0 % (ref 0.0–0.2)

## 2019-07-22 MED FILL — Water For Irrigation, Sterile Irrigation Soln: Qty: 250 | Status: AC

## 2019-07-22 MED FILL — Sucralfate Susp 1 GM/10ML: ORAL | Qty: 20 | Status: AC

## 2019-07-22 NOTE — Progress Notes (Signed)
In much better spirits today.  Making some progress with bowel bladder strength.  He is getting ready to go to [place].  Will schedule his appt with me in one month  Ruta Hinds, MD [facility] Office: [phone] Pager: [phone]

## 2019-07-22 NOTE — Progress Notes (Signed)
Patient transported to HD.  Sent both IV antibiotics with patient for administration there at end of treatment.  Brita Romp, RN

## 2019-07-22 NOTE — Plan of Care (Signed)
  Problem: Consults Goal: RH GENERAL PATIENT EDUCATION Description: See Patient Education module for education specifics. Outcome: Progressing   Problem: RH BOWEL ELIMINATION Goal: RH STG MANAGE BOWEL WITH ASSISTANCE Description: STG Manage Bowel with Assistance. mod Outcome: Progressing   Problem: RH SKIN INTEGRITY Goal: RH STG SKIN FREE OF INFECTION/BREAKDOWN Description: Remain free of breakdown with mod assist Outcome: Progressing Goal: RH STG MAINTAIN SKIN INTEGRITY WITH ASSISTANCE Description: STG Maintain Skin Integrity With Assistance. Mod Outcome: Progressing   Problem: RH SAFETY Goal: RH STG ADHERE TO SAFETY PRECAUTIONS W/ASSISTANCE/DEVICE Description: STG Adhere to Safety Precautions With Assistance/Device. mod Outcome: Progressing Goal: RH STG DECREASED RISK OF FALL WITH ASSISTANCE Description: STG Decreased Risk of Fall With Assistance. Adhere to safety precautions, will have no falls w/ mod assist Outcome: Progressing   Problem: RH PAIN MANAGEMENT Goal: RH STG PAIN MANAGED AT OR BELOW PT'S PAIN GOAL Description: Less than 3 Outcome: Progressing   Problem: RH KNOWLEDGE DEFICIT GENERAL Goal: RH STG INCREASE KNOWLEDGE OF SELF CARE AFTER HOSPITALIZATION Description: Patient will be able to describe self care at home following hospitalization with cues/reminders Outcome: Progressing

## 2019-07-22 NOTE — Progress Notes (Signed)
[place] PHYSICAL MEDICINE & REHABILITATION PROGRESS NOTE   Subjective/Complaints:   Reports that voided on his own this AM for the first time in 3 months.really excited/bright affect.  Frank Fuller is willing to go to [place] for longer than 2 weeks as long as gets to go home and see his dog first.  Spoke to [facility]- they have a waiting list right now, but are trying to prioritize taking him since he lives alone.       ROS: Patient denies fever, rash, sore throat, blurred vision, nausea, vomiting, diarrhea, cough, shortness of breath or chest pain, joint or back pain, headache, or mood change.   Objective:   No results found. Recent Labs    07/21/19 0548  WBC 4.9  HGB 9.9*  HCT 32.0*  PLT 168   Recent Labs    07/21/19 0548  NA 136  K 5.0  CL 97*  CO2 24  GLUCOSE 91  BUN 34*  CREATININE 8.27*  CALCIUM 9.6    Intake/Output Summary (Last 24 hours) at 07/22/2019 0935 Last data filed at 07/22/2019 0756 Gross per 24 hour  Intake 820 ml  Output -  Net 820 ml     Physical Exam: Vital Signs Blood pressure (!) 173/89, pulse 75, temperature 97.6 F (36.4 C), temperature source Oral, resp. rate 17, weight 85.6 kg, SpO2 96 %. Constitutional: No distress . Vital signs reviewed. Awake, alert, smiling big, sitting at sink doing grooming- said couldn't go back to sleep after voiding this AM, NAD HEENT: conjugate gaze;  oral membranes moist Neck: supple Cardiovascular: RRR without murmur.  Port in chest   Respiratory: CTA Bilaterally without wheezes or rales. Normal effort    GI: BS +, non-tender, non-distended  Skin: Warm and dry.  Intact. Psych: appropriate and brighter affect; actually smiled throughout interview Musc: No edema in extremities.  No tenderness in extremities. Neurological: Alert Motor: Bilateral upper extremities: 5/5 proximal distal, unchanged Right lower extremity: Hip flexion, knee extension 2/5, ankle dorsiflexion 4/5, no changes Left lower extremity:  Hip flexion 2-/5, knee extension 2+/5, ankle dorsiflexion 4/5, no changes  Assessment/Plan: 1. 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  Physiatrist is providing close team supervision and 24 hour management of active medical problems listed below.  Physiatrist and rehab team continue to assess barriers to discharge/monitor patient progress toward functional and medical goals  Care Tool:  Bathing  Bathing activity did not occur: Refused Body parts bathed by patient: Right arm, Left arm, Chest, Abdomen, Front perineal area, Right upper leg, Left upper leg, Right lower leg, Left lower leg, Face, Buttocks   Body parts bathed by helper: Buttocks     Bathing assist Assist Level: Supervision/Verbal cueing(Bed level)     Upper Body Dressing/Undressing Upper body dressing   What is the patient wearing?: Pull over shirt    Upper body assist Assist Level: Set up assist    Lower Body Dressing/Undressing Lower body dressing      What is the patient wearing?: Pants, Incontinence brief     Lower body assist Assist for lower body dressing: Minimal Assistance - Patient > 75%(Bed level)     Toileting Toileting    Toileting assist Assist for toileting: Minimal Assistance - Patient > 75%     Transfers Chair/bed transfer  Transfers assist     Chair/bed transfer assist level: Supervision/Verbal cueing  Locomotion Ambulation   Ambulation assist   Ambulation activity did not occur: Safety/medical concerns          Walk 10 feet activity   Assist  Walk 10 feet activity did not occur: Safety/medical concerns        Walk 50 feet activity   Assist Walk 50 feet with 2 turns activity did not occur: Safety/medical concerns         Walk 150 feet activity   Assist Walk 150 feet activity did not occur: Safety/medical concerns          Walk 10 feet on uneven surface  activity   Assist Walk 10 feet on uneven surfaces activity did not occur: Safety/medical concerns         Wheelchair     Assist Will patient use wheelchair at discharge?: Yes Type of Wheelchair: Manual    Wheelchair assist level: Independent Max wheelchair distance: 200    Wheelchair 50 feet with 2 turns activity    Assist        Assist Level: Independent   Wheelchair 150 feet activity     Assist  Wheelchair 150 feet activity did not occur: Safety/medical concerns   Assist Level: Independent   Blood pressure (!) 173/89, pulse 75, temperature 97.6 F (36.4 C), temperature source Oral, resp. rate 17, weight 85.6 kg, SpO2 96 %.  Medical Problem List and Plan: 1.  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   Continue CIR 2.  Antithrombotics:  -DVT/anticoagulation: SCDs.  LE vascular study limited, but negative for DVT.             -antiplatelet therapy: Low-dose aspirin 81 mg daily 3. Pain Management:   Hydrocodone q 6 h prn increase to every 4 hours as needed, increased to 5-10/325 every 4 hours as needed on 9/16  Added oxy CR at noc low dose, monitor for nausea              -prn tylenol             -prn robaxin for spasms  Added tucks QID and lidocaine QID  Gabapentin 100 nightly started on 9/17, increased to 200 on 9/18 with good improvement for neuropathic pain  9/23- will try lyrica- 25 mg BID- gabapentin making him sleepy- will wean Gabapentin once on higher dose of Lyrica.   9/24- pain somewhat better this AM  9/25- Lyrica really helping- pain down to 3/10- will con't for now and try to reduce Gabapentin next week. Will increase Lyrica to 50 mg BID to cover Gabapentin dose and stop Gabapentin on Monday.   9/28- d/c'd Gabapentin today- hopefully will not affect pain.  9/29- no change in pain control    9/27--pt reports pain is under good control today 4. Mood: Provide emotional support. Pt somewhat disheartened by multiple medical setbacks.  9/28- brighter affect, esp about going to [facility].               -antipsychotic agents: N/A 5. Neuropsych: This patient is capable of making decisions on his own behalf. 6. Skin/Wound Care: Routine skin checks 7. Fluids/Electrolytes/Nutrition: Routine in and outs 8. ID: Continue vancomycin as well as Maxipime through 07/29/2019 per infectious disease  Random level WNL on 9/19 9. Rectal ulcers/H. pylori.  Underwent EGD and flexible sigmoidoscopy 06/24/2019.  Follow-up per GI services.  Received Carafate enema x2 weeks as well as Flagyl x14 days initiated 06/26/2019 and  tetracycline x14 days, completed course on 9/17.  -GI following and have discussed with pt re: continuing enemas  -continue to follow hgbs and if any significant bleeding recurs he will need flex sig per Dr. Doyne Keel note  Hemoglobin 9.1 on 9/19  9/23- ordered labs for now and tomorrow- pt reports "pool of blood" from carafate enema last night.   Continue to monitor  9/24- Hb stable- wil avoid enema x 24 hours per GI-  Will con't to attempt to do bowel program  9/25- discussed need to con't bowel program- will check with nursing that they are doing- explained it takes 3-6 weeks to train gut.  9/27--continue bowel program per primary team 10.  Acute on chronic normocytic anemia.  Patient did receive 2 units packed red blood cells 06/23/2019 and 1 unit PRBC 07/01/2019 and remains on Aranesp.    See #9. 11.  End-stage renal disease.  Continue hemodialysis per renal services.             -HD after therapies to maximize rehab participation 12.  Thoracic aortic dissection.  Status post endovascular stent placement 06/06/2019.  Follow-up per Dr. Oneida Alar 13.  Neurogenic bowel/ bladder. I's and O's reviewed.   -He is essentially anuric.    -We will have patient sit up after administration for  bowel movement, nursing to check for impaction  -pt having regular incontinent bm's most days  -defer to primary team re: ongoing adjustment of bowel program  9/29- voided this AM for first time in 3 months- bodes well. 14.  Hypertension as well as history of first-degree AV block.  Norvasc 10 mg nightly, hydralazine 100 mg every 8 hours, labetalol 300 mg twice daily.  Monitor with increased mobility in therapies.   Spironolactone 25 daily started on 9/13  Losartan 100 daily started on 9/18 per Nephro recs  Remains extremely elevated on 9/20 with hypertensive crisis, however patient missed several doses of medications yesterday.  Will need to be more diligent about administering medications, particularly given #12  Need to have better blood pressure control  9/21- BP 180-190/90s still- will d/w Nephrology  9/22- will add Clonidine #2 patch- not many other choices and recommended by Nephrology. However per chart, clonidine causes dizziness- will speak with pt prior.  9/23- per pt, CANNOT tolerate Clonidine- will d/w Nephrology.  9/24- BP better in 160s/90s was 170s-190/110s  9/27--better but still borderline/poor control, on near-max/max dose of a few meds  9/28- 145/95- much better  9/29- no new notes from Nephrology- will add PA's to call Vitals:   07/21/19 2000 07/22/19 0526  BP: (!) 164/91 (!) 173/89  Pulse: 72 75  Resp: 16 17  Temp: 98.2 F (36.8 C) 97.6 F (36.4 C)  SpO2: 94% 96%  15.  Hyperlipidemia.  Lipitor 16.  Tobacco abuse.  Counseling 17.  Thrombocytopenia  Platelets 141 on 9/19  Continue to monitor 18.  Hyperglycemia  Elevated on 9/19, continue to monitor 19. Thrush  9/21- will start Diflucan and dose in evening; and Magic Mouthwash  9/22- says doing better  20- Dispo  9/28- calling pt's PCP at [phone] Dr[facility] Clare Charon. To ask about ramp and about Depends per pt request. Have left message for PCP to call me back- left my number. - he has ordered ramp for  pt and will order Depends- fax d/c summary, etc to [phone]   LOS: 18 days A FACE TO FACE EVALUATION WAS PERFORMED  Frank Fuller 07/22/2019, 9:35 AM

## 2019-07-22 NOTE — Progress Notes (Signed)
Physical Therapy Session Note  Patient Details  Name: Frank Fuller MRN: [id number] Date of Birth: 08/04/1961  Today's Date: 07/22/2019 PT Individual Time: 1045-1200 PT Individual Time Calculation (min): 75 min   Short Term Goals: Week 3:  PT Short Term Goal 1 (Week 3): =LTG due to ELOS  Skilled Therapeutic Interventions/Progress Updates:    Pt received supine in bed, agreeable to PT session. No complaints of pain. Bed mobility at mod I level. Squat pivot transfer bed to w/c with Supervision. Pt is at mod I level for w/c mobility with use of BUE. Squat pivot transfer w/c to/from Nustep with Supervision with some cueing for safe w/c setup. Nustep level 1 x 10 min with focus on use of B LE>UE x 10 min for BLE NMR and strengthening. Pt demos improved control of BLE while on Nustep. Pt with questions about his d/c plan, updated pt that his ramp may likely not be in place by end of the week. Pt states he will be able to "walk up the stairs". Again reiterated pt's current physical limitations with LE weakness. Pt reports his brother will be able to help up walk up the stairs and then becomes somewhat tearful. Provided emotional support and also reiterated that pt will not be physically able to walk up the stairs as right now he is able to stand with therapy but does not have the strength or endurance to even take steps. Provided pt with handout for how to bump w/c up stairs but requires assist x 2 to complete stairs in w/c via this method. Set pt with with maxisky. Sit to stand in // bars while in [employer]. Pt is able to stand x 30 sec maximum with onset of B knee buckling in stance after a few seconds of standing. Pt unable to activate quads for knee extension in stance. Pt unable to safely take steps in // bars due to significant LE weakness from SCI. Following standing trials pt exhibits improved insight into his deficits and reports his understanding he will not be able to navigate stairs at this time.  Ascend/descend ramp in w/c with CGA to initial ascend then SBA for safety. Adjusted B leg rests for improved clearance on ramp. Pt left seated in w/c in room with needs in reach at end of session.  Therapy Documentation Precautions:  Precautions Precautions: Fall Precaution Comments: paraplegic Restrictions Weight Bearing Restrictions: No    Therapy/Group: Individual Therapy   Excell Seltzer, PT, DPT  07/22/2019, 12:27 PM

## 2019-07-22 NOTE — Progress Notes (Signed)
[facility] Progress Note   Subjective:  No c/o, seen on HD>   Objective Vitals:   07/22/19 1500 07/22/19 1530 07/22/19 1600 07/22/19 1630  BP: (!) 147/91 (!) 153/97 136/87 (!) 174/99  Pulse: 76 80 76 80  Resp:      Temp:      TempSrc:      SpO2:      Weight:       Physical Exam General: Well developed, alert male in NAD Heart: RRR, no murmurs, rubs or gallops Lungs: Lungs CTA bilaterally without wheezing, rhonchi or rales Abdomen: Abdomen soft, non-tender, non-distended. + BS Extremities: No peripheral edema. Wiggles toes on both feet Dialysis Access:  R IJ TDC without erythema/drainage  Additional Objective Labs: Basic Metabolic Panel: Recent Labs  Lab 07/17/19 0606 07/21/19 0548 07/22/19 1409  NA 134* 136 135  K 5.1 5.0 5.4*  CL 96* 97* 98  CO2 25 24 21*  GLUCOSE 90 91 89  BUN 41* 34* 47*  CREATININE 9.20* 8.27* 10.02*  CALCIUM 9.3 9.6 9.2  PHOS  --  5.3* 5.6*   Liver Function Tests: Recent Labs  Lab 07/17/19 0606 07/21/19 0548 07/22/19 1409  AST 17  --   --   ALT 10  --   --   ALKPHOS 69  --   --   BILITOT 0.2*  --   --   PROT 6.1*  --   --   ALBUMIN 2.1* 2.4* 2.3*   CBC: Recent Labs  Lab 07/16/19 0956 07/17/19 0606 07/21/19 0548 07/22/19 1409  WBC 4.3 4.9 4.9 4.7  NEUTROABS 2.4 2.7 2.7  --   HGB 9.6* 9.5* 9.9* 8.5*  HCT 30.7* 29.6* 32.0* 27.3*  MCV 94.8 93.7 95.5 98.2  PLT 145* 149* 168 162   Blood Culture    Component Value Date/Time   SDES BLOOD LEFT HAND 07/03/2019 1644   SPECREQUEST  07/03/2019 1644    BOTTLES DRAWN AEROBIC ONLY Blood Culture adequate volume   CULT  07/03/2019 1644    NO GROWTH 5 DAYS Performed at [facility], [place] [address] [place], [place] [address]    REPTSTATUS 07/08/2019 FINAL 07/03/2019 1644    Medications: . ceFEPime (MAXIPIME) IV Stopped (07/19/19 1944)  . vancomycin Stopped (07/19/19 1728)   . amLODipine  10 mg Oral QHS  . atorvastatin  20 mg Oral q1800  . Chlorhexidine  Gluconate Cloth  6 each Topical Q0600  . citalopram  10 mg Oral Daily  . darbepoetin (ARANESP) injection - DIALYSIS  150 mcg Intravenous Q Sat-HD  . feeding supplement (NEPRO CARB STEADY)  237 mL Oral BID BM  . hydrALAZINE  100 mg Oral Q8H  . labetalol  300 mg Oral BID  . lanthanum  1,000 mg Oral TID WC  . lidocaine  1 application Topical Once  . losartan  100 mg Oral Daily  . multivitamin  1 tablet Oral QHS  . oxyCODONE  10 mg Oral QHS  . pantoprazole  40 mg Oral BID  . pregabalin  50 mg Oral BID  . spironolactone  25 mg Oral Daily  . sucralfate  2 g Rectal Q12H  . witch hazel-glycerin   Topical QID    Dialysis Orders: [facility] 4h 57min 400/1.5 87.5kg 2/2.25 P2 R TDC Hep none - Parsabiv 2.5mg  IV q HD  - Calcitriol 1.34mcg PO q HD  Assessment/Plan: 1. Spinal cord infarct/ paraperesis: per spinal MRI 8/23at T9- T11, following presentation with inability to move  his legs. Neuro, NSurg and VVS consulted, all felt etiology was likely spinal artery injury related to aortic dissection procedure. All recommended supportive care and hope for recovery. Per NSurg, no indication for surgery. Continue treatment with aspirin recommended. Now admitted to CIR for ongoing physical therapy/Occupational Therapy along with psychotherapy and pain management. 2. ESRD:Continue HD on TTS schedule. HD today.  3. RecentType B aortic dissection (s/p stent graft repair 8/14):Goal SBP per VVS 120-170. Known severe resistant HTN. Continue to avoid intradialytic hypotension.  4. HTN/volume: Blood pressures remain high but overall within goal of 120-170. Now on amlodipine 10, hydralazine 100 tid, labetalol 300 bid and losartan as prior.  Spironolactone 25 started here. New dry wt around 83- 85kg.  5. L4-L5 lumbar discitis: s/p recent aortic stentgraft. Plans noted for intravenous vancomycin and cefepime until 07/29/2019.  6. Hematochezia- recurrent:Flex sig 9/1 showedstigmata of recent  bleedinganddiffusely ulcerated rectum.EGD with fewnon-bleeding erosions in gastric antrum. Rx'd w/ miralax, carafate < 2 weeks. Repeat Flex sig done 9/4 withhemostatic spray to entire rectum.  7. Anemia(ESRD + ABLA):Hgb stable today at 9.5. On Aranesp 150 q Sat. Transfuse when hemoglobin <7 8. Secondary hyperparathyroidism - Phos 6.1 > 5.8. Binders changed to Fosrenol d/t concern that Auryxia irritating his bowels. Calcium 9.3, corrected 10.8. Not currently on calcitriol. Use low calcium bath.Parsabiv not on hospital formulary. 9. Nutrition: Significant hypoalbuminemia noted likely following acute hospitalization/surgery. Continue renal diet with ONS. 10. Recent MSSA bacteremia/AVG infection:S/p AVG removal June/ July andAncefcoursecompleted. Reports plans for new AVF after discharge. 11. Depression -following recent sequence of clinical events.  On citalopram.   [name], MD 07/22/2019, 5:18 PM

## 2019-07-22 NOTE — Progress Notes (Signed)
Occupational Therapy Session Note  Patient Details  Name: Frank Fuller MRN: [id number] Date of Birth: 1960/12/31  Today's Date: 07/22/2019 OT Individual Time: [id number] OT Individual Time Calculation (min): 55 min    Short Term Goals: Week 3:  OT Short Term Goal 1 (Week 3): STG=LTG due to LOS  Skilled Therapeutic Interventions/Progress Updates:    Treatment session with focus on functional transfers, dynamic sitting balance, and sit > stand.  Pt received upright in bed reporting need to toilet.  Completed squat pivot transfer to drop arm BSC with supervision.  Pt able to doff pants with lateral leans with increased time and effort.  Pt continent of BM and able to complete hygiene with lateral leans.  Pt requesting assist with clothing management post toileting due to fatigue.  Pt pushed up in partial stand to allow therapist to pull pants over hips.  Engaged in grooming from w/c level with setup assist.  Pt propelled w/c to therapy gym at Mod I level.  Engaged in sit <> stand in Standing Frame with focus on motor control and BLE strengthening.  Engaged in mini squats with loosened harness to allow for BLE strengthening as needed to progress with ADLs.  Pt returned to room and left upright in w/c with all needs in reach.  Therapy Documentation Precautions:  Precautions Precautions: Fall Precaution Comments: paraplegic Restrictions Weight Bearing Restrictions: No General:   Vital Signs: Therapy Vitals Temp: 97.6 F (36.4 C) Temp Source: Oral Pulse Rate: 75 Resp: 17 BP: (!) 173/89 Patient Position (if appropriate): Lying Oxygen Therapy SpO2: 96 % O2 Device: Room Air Pain: Pain Assessment Pain Scale: 0-10 Pain Score: 5  Pain Type: Acute pain Pain Location: Rectum Pain Orientation: Mid Pain Descriptors / Indicators: Aching Pain Frequency: Constant Pain Onset: On-going Patients Stated Pain Goal: 0 Pain Intervention(s): Medication (See eMAR)   Therapy/Group: Individual  Therapy  Simonne Come 07/22/2019, 8:50 AM

## 2019-07-23 ENCOUNTER — Inpatient Hospital Stay (HOSPITAL_COMMUNITY): Payer: Medicare Other | Admitting: Physical Therapy

## 2019-07-23 ENCOUNTER — Inpatient Hospital Stay (HOSPITAL_COMMUNITY): Payer: Medicare Other | Admitting: Occupational Therapy

## 2019-07-23 ENCOUNTER — Inpatient Hospital Stay: Payer: Medicare Other | Admitting: Family

## 2019-07-23 NOTE — Discharge Summary (Signed)
Physician Discharge Summary  Patient ID: Frank Fuller MRN: [id number] DOB/AGE: [birth year] 58 y.o.  Admit date: 07/04/2019 Discharge date: 07/29/2019  Discharge Diagnoses:  Principal Problem:   Paraplegia [facility]) Active Problems:   ESRD on hemodialysis ([place])   Gastric ulcer   Discitis   Myelopathy of thoracic region   Reactive depression   Hypertensive crisis   Essential hypertension   Acute on chronic anemia   Bacteremia   Renovascular hypertension   Neuropathic pain   Neurogenic bowel DVT prophylaxis H. Pylori End-stage renal disease with hemodialysis Neurogenic bowel and bladder  Discharged Condition: Stable  Significant Diagnostic Studies: Ct Abdomen Pelvis Wo Contrast  Result Date: 07/04/2019 CLINICAL DATA:  GI bleed EXAM: CT ABDOMEN AND PELVIS WITHOUT CONTRAST TECHNIQUE: Multidetector CT imaging of the abdomen and pelvis was performed following the standard protocol without IV contrast. COMPARISON:  June 22, 2019 FINDINGS: Lower chest: The visualized heart size within normal limits. No pericardial fluid/thickening. 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 A small hiatal hernia is seen. The visualized portions of the lungs are clear. Hepatobiliary: Although limited due to the lack of intravenous contrast, normal in appearance without gross focal abnormality. No evidence of calcified gallstones or biliary ductal dilatation. Pancreas:  Unremarkable.  No surrounding inflammatory changes. Spleen: Normal in size. Although limited due to the lack of intravenous contrast, normal in appearance. Adrenals/Urinary Tract: Both adrenal glands appear normal. Bilateral renal atrophy seen. Stomach/Bowel: The stomach and small bowel are grossly unremarkable. There is a moderate amount of colonic stool. Within the presacral and pre coccygeal space there is increased fat stranding, heterogeneous soft tissue density, and ill-defined mesenteric edema. No definite loculated fluid collection is  seen. There appears to be circumferential rectal bowel wall thickening as well. Vascular/Lymphatic: There are no enlarged abdominal or pelvic lymph nodes. Scattered aortic atherosclerotic calcifications are seen without aneurysmal dilatation. Reproductive: The prostate is unremarkable. Other: No evidence of abdominal wall mass or hernia.  No free air. Musculoskeletal: No acute or significant osseous findings. IMPRESSION: 1. Interval slight worsening in the presacral and pre coccygeal ill-defined heterogeneous edema and soft tissue density with circumferential rectal wall thickening. This is likely related to patient's gastrointestinal bleed. No definite loculated fluid collection Electronically Signed   By: Prudencio Pair M.D.   On: 07/04/2019 04:04   Vas [place] Lower Extremity Venous (dvt)  Result Date: 07/06/2019  Lower Venous Study Indications: Swelling, and Edema.  Limitations: Patient positioning. Comparison Study: no prior Performing Technologist: Abram Sander RVS  Examination Guidelines: A complete evaluation includes B-mode imaging, spectral Doppler, color Doppler, and power Doppler as needed of all accessible portions of each vessel. Bilateral testing is considered an integral part of a complete examination. Limited examinations for reoccurring indications may be performed as noted.  +---------+---------------+---------+-----------+----------+--------------+ RIGHT    CompressibilityPhasicitySpontaneityPropertiesThrombus Aging +---------+---------------+---------+-----------+----------+--------------+ CFV      Full           Yes      Yes                                 +---------+---------------+---------+-----------+----------+--------------+ SFJ      Full                                                        +---------+---------------+---------+-----------+----------+--------------+  FV Prox  Full                                                         +---------+---------------+---------+-----------+----------+--------------+ FV Mid   Full                                                        +---------+---------------+---------+-----------+----------+--------------+ FV DistalFull                                                        +---------+---------------+---------+-----------+----------+--------------+ PFV      Full                                                        +---------+---------------+---------+-----------+----------+--------------+ POP      Full           Yes      Yes                                 +---------+---------------+---------+-----------+----------+--------------+ PTV      Full                                                        +---------+---------------+---------+-----------+----------+--------------+ PERO     Full                                                        +---------+---------------+---------+-----------+----------+--------------+   +---------+---------------+---------+-----------+----------+--------------+ LEFT     CompressibilityPhasicitySpontaneityPropertiesThrombus Aging +---------+---------------+---------+-----------+----------+--------------+ CFV      Full           Yes      Yes                                 +---------+---------------+---------+-----------+----------+--------------+ SFJ      Full                                                        +---------+---------------+---------+-----------+----------+--------------+ FV Prox  Full                                                        +---------+---------------+---------+-----------+----------+--------------+  FV Mid   Full                                                        +---------+---------------+---------+-----------+----------+--------------+ FV DistalFull                                                         +---------+---------------+---------+-----------+----------+--------------+ PFV      Full                                                        +---------+---------------+---------+-----------+----------+--------------+ POP      Full           Yes      Yes                                 +---------+---------------+---------+-----------+----------+--------------+ PTV                                                   Not visualized +---------+---------------+---------+-----------+----------+--------------+ PERO                                                  Not visualized +---------+---------------+---------+-----------+----------+--------------+     Summary: Right: There is no evidence of deep vein thrombosis in the lower extremity. No cystic structure found in the popliteal fossa. Left: There is no evidence of deep vein thrombosis in the lower extremity. However, portions of this examination were limited- see technologist comments above. No cystic structure found in the popliteal fossa.  *See table(s) above for measurements and observations. Electronically signed by Servando Snare MD on 07/06/2019 at 11:36:28 AM.    Final     Labs:  Basic Metabolic Panel: Recent Labs  Lab 07/22/19 1409 07/24/19 1959 07/26/19 1339 07/28/19 0545  NA 135 135 134* 135  K 5.4* 4.8 4.2 3.8  CL 98 98 96* 98  CO2 21* _0 GLUCOSE 89 98 110* 90  BUN 47* 39* 27* 23*  CREATININE 10.02* 9.65* 8.11* 7.48*  CALCIUM 9.2 9.2 9.4 9.4  PHOS 5.6* 4.7* 4.6 5.2*    CBC: Recent Labs  Lab 07/25/19 0621 07/26/19 1339 07/28/19 0545  WBC 4.0 4.5 4.5  NEUTROABS 2.3  --  2.4  HGB 9.5* 9.1* 9.2*  HCT 29.2* 28.2* 28.7*  MCV 94.8 96.2 94.1  PLT 132* 121* 119*    CBG: No results for input(s): GLUCAP in the last 168 hours.  Family history.  Mother with hypertension.  Father with hypertension.  Denies any colon cancer or diabetes mellitus  Brief HPI:   Frank Fuller is a 58 y.o. right-handed  male with history of hypertension,  tobacco abuse, end-stage renal disease with recent MSSA bacteremia/AVG graft infection status post revision 03/12/2019 and then partial resection 04/12/2019, first-degree AV block, AAA status post stenting, chronic anemia, hepatitis C, CVA.  Lives alone independent prior to admission.  He does have a brother and sister in the area.  Patient with long complicated medical course recently hospitalized 06/03/2019 to 06/12/2019 for dissection of thoracic aorta type B treated with endovascular stent graft with spinal drain per Dr. Oneida Alar on 06/06/2019.  He was discharged home ambulating 60 feet modified independent but immediately returned with complaints of back pain and abdominal pain fever as well as lower extremity weakness and paraplegia.  Initially presented [facility]/13/[facility] to [facility] with fever 102.4, ESR 138, blood pressure 192/96, WBC 17,900, hemoglobin 10.2, creatinine 5.8, COVID negative.  CT angiogram of the chest abdomen and pelvis obtained showed interval repair of thoracic aortic dissection slight increase in left pleural effusion improved right lung nodular airspace disease likely infectious generalized ileus with slow bowel transient and large colonic stool burden with mild rectal wall thickening.  Placed on broad-spectrum antibiotics transferred to [facility] for further evaluation.  Cranial CT scan showed left caudate head lacunar infarct new since prior study but appeared remote no other acute abnormality.  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  More questionably abnormal spinal cord at T9-10 and T10-11 where central cord signal abnormality without cord expansion might indicate a segment cord infarct and clinical setting.  Nonspecific mild bilateral posterior paraspinal muscle inflammation at the  midthoracic level T6-T9.  MRI lumbar spine showed no new space-occupying material in the lower retroperitoneum and sub-sacral junction prevertebral space since CT 06/13/2019 favoring acute discitis osteomyelitis.  No drainable fluid collection abscess identified.  Neurosurgery Dr. Arnoldo Morale consulted no surgical intervention.  Patient did undergo disc aspiration per interventional radiology 06/18/2019 with culture negative.  Placed on a 6-week antibiotic course for discitis with vancomycin and cefepime for 6 weeks through 07/29/2019.  Hospital course complicated by multiple episodes of bloody bowel with clots FOBT positive with acute blood loss anemia.  Gastroenterology consulted underwent EGD and flexible sigmoidoscopy 06/24/2019 with findings of rectal ulceration likely due to recent impaction and underwent single session of hemo-spray 07/17/2019.  Patient's hemoglobin noted 6 1-6 9 06/27/2019 he did receive 2 units of packed red blood cells 06/23/2019 as well as 1 unit red blood cells 07/01/2019 latest hemoglobin 8.8.  Patient placed on therapy for H. pylori with tetracycline and Flagyl therapy is completed and admitted for a comprehensive rehab program   Hospital Course: Frank Fuller was admitted to rehab 07/04/2019 for inpatient therapies to consist of PT, ST and OT at least three hours five days a week. Past admission physiatrist, therapy team and rehab RN have worked together to provide customized collaborative inpatient rehab.  Pertaining to patient paraplegia and sensory loss secondary to T10 spinal cord infarction after 06/06/2019 aortic dissection with stenting associated lumbar discitis with osteomyelitis.  Patient remained on vancomycin as well as Maxipime through 07/29/2019 per infectious disease.  He remained afebrile.  DVT prophylaxis with SCD and vascular study negative.  He was on low-dose aspirin.  Pain managed with use of hydrocodone adjusted accordingly as well as nighttime scheduled OxyContin 10 mg  nightly.  Neurontin added for neuropathic pain and titrated to off and addition of Lyrica was added.  Noted rectal ulcers H. pylori  underwent EGD flexible sigmoidoscopy 06/24/2019 he would follow-up with gastroenterology services received Carafate enemas x2 weeks as well as Flagyl x14 days with tetracycline course completed.  He was initially needing some encouragement to continue with Carafate enemas.  Hemoglobin hematocrit remained stable and he did remain on Aranesp.  Hemodialysis ongoing as per renal services.  He would follow-up with vascular surgery regards to thoracic aortic dissection with stenting 06/06/2019.  Blood pressure continued to be somewhat of an issue with follow-up assistance by nephrology services maintained on Spironolactone 25 mg daily, hydralazine 100 mg every 8 hours, clonidine 0.1 mg BID, Norvasc 10 mg daily, labetalol 400 mg twice daily and cozaar 100 mg daily.  In regards to neurogenic bowel bladder he was essentially anuric.  His bowel program was established.  He was treated for thrush with Diflucan which was much improved.  Patient's plan of care along with hemodialysis was to follow-up with PCP at [place] at [phone].  Arrangements were made for a ramp to be placed at home that has been discussed with the [employer].  Arrangements made and patient has been accepted into the [facility] program through the [facility] administration however it was estimated a 2-week wait for available bed thus patient would be first discharged to home and then to the SCI program.   Blood pressures were monitored on TID basis and monitored  Diabetes has been monitored with ac/hs CBG checks and SSI was use prn for tighter BS control.   He/ has made gains during rehab stay and is attending therapies  He/She will continue to receive follow up therapies   after discharge  Rehab course: During patient's stay in rehab weekly team conferences  were held to monitor patient's progress, set goals and discuss barriers to discharge. At admission, patient required moderate assist supine to sit, max assist sit to supine, max assist sit to stand.  Minimal assist upper body bathing moderate assist lower body bathing minimal assist upper body dressing total assist lower body dressing  Physical exam.  Blood pressure 156/81 pulse 81 temperature 98.9 respiration 19 oxygen saturation 95% room air Constitutional alert and oriented HEENT Head.  Normocephalic and atraumatic Eyes.  Pupils round and reactive to light EOMs normal no nystagmus Neck.  Supple nontender normal range of motion no tracheal deviation no thyromegaly Respiratory effort normal breath sounds normal no respiratory distress no wheezes without rails GI.  Soft exhibits no distention nontender without rebound positive bowel sounds Musculoskeletal.  General.  No edema Comments.  Bilateral pes planus deformities Neurological no cranial nerve deficits upper extremities 4- to 5 out of 5.  Lower extremities 3 out of 5 hip flexors, knee extension and 3+ to 4 out of 5 ankle dorsi plantarflexion.  Sensation 1 out of 2 below the umbilicus for light touch and proprioception DTRs trace in all fours.  He/  has had improvement in activity tolerance, balance, postural control as well as ability to compensate for deficits. He/ has had improvement in functional use RUE/LUE  and RLE/LLE as well as improvement in awareness.  Working with energy conservation techniques.  Bed mobility modified independent.  Squat pivot transfers bed to wheelchair with supervision.  Manual wheelchair propulsion 150 feet modified independent.  Squat pivot transfers to the mat table with supervision.  Sit to supine modified independent.  Supine to prone with supervision.  Patient completed squat pivot transfers throughout a session supervision during ADLs.  Engaged in bridging, heel slides,  hip rotation and hip flexion in supine  with cues for controlled movements.  Patient able to doff clothing but required assistance to pull briefs and pants over hips after toileting.  Family teaching completed plan discharge to home       Disposition: Discharge disposition: 01-Home or Self Care     Discharge to home   Diet: Renal diet  Special Instructions: Continue hemodialysis as directed  Medications at discharge 1.  Tylenol as needed 2.  Norvasc 10 mg p.o. nightly 3.  Lipitor 20 mg p.o. daily 4.  Celexa 10 mg p.o. daily 5.  Hydralazine 100 mg every 8 hours 6.  Hydrocodone 7.5-325 mg 1 tablet every 4 hours as needed pain 7.  Labetalol 400 mg p.o. twice daily 8.fosrenol 1000 mg 3 times daily 9.  Cozaar 100 mg p.o. daily 10.  Rena-Vite 1 tablet nightly 11.  Nitroglycerin as needed 12.  Protonix 40 mg p.o. twice daily 13.  Lyrica 50 mg p.o. twice daily 14.  Aldactone 25 mg p.o. daily 15.  Tucks pads 4 times daily 16.Clonidine 0.1 mg BID 17.Fosrenol 1000 mg TID with meals 18.Aspirin 81 mg daily  Discharge Instructions    Ambulatory referral to Physical Medicine Rehab   Complete by: As directed    Moderate complexity follow up 1-2 weeks lumbar myelopathy      Follow-up Information    Lovorn, Jinny Blossom, MD Follow up.   Specialty: Physical Medicine and Rehabilitation Why: Office to call for appointment Contact information: [address] [place] [address] [phone]        Elam Dutch, MD Follow up.   Specialties: Vascular Surgery, Cardiology Why: Call for appointment Contact information: [place] [address] [phone]        Ladene Artist, MD Follow up.   Specialty: Gastroenterology Why: Call for appointment Contact information: [address] [place] [address] [phone]        Elmarie Shiley, MD Follow up.   Specialty: Nephrology Why: Call for appointment Contact information: [place] [address] [phone]         Michel Bickers, MD Follow up.   Specialty: Infectious Diseases Why: Call for appointment Contact information: [address] [place] [address] [phone]           Signed: Lavon Paganini Tynan 07/29/2019, 5:26 AM

## 2019-07-23 NOTE — Progress Notes (Signed)
[place] PHYSICAL MEDICINE & REHABILITATION PROGRESS NOTE   Subjective/Complaints:   Pt reports his brother isn't going to be at his house during the day because his son is doing virtual school during the day- will still come help with dog- asking for help, but explained might not be appropriate due to his level of function- per OT, too high level for bed/bath aide.  Pt emphatic he needs depends-explained I discussed with Dr Clare Charon. Explained we will d/c 10/6, after done with IV ABX.     ROS: Patient denies fever, rash, sore throat, blurred vision, nausea, vomiting, diarrhea, cough, shortness of breath or chest pain, joint or back pain, headache, or mood change.   Objective:   No results found. Recent Labs    07/21/19 0548 07/22/19 1409  WBC 4.9 4.7  HGB 9.9* 8.5*  HCT 32.0* 27.3*  PLT 168 162   Recent Labs    07/21/19 0548 07/22/19 1409  NA 136 135  K 5.0 5.4*  CL 97* 98  CO2 24 21*  GLUCOSE 91 89  BUN 34* 47*  CREATININE 8.27* 10.02*  CALCIUM 9.6 9.2    Intake/Output Summary (Last 24 hours) at 07/23/2019 1403 Last data filed at 07/23/2019 1314 Gross per 24 hour  Intake 660 ml  Output 2502 ml  Net -1842 ml     Physical Exam: Vital Signs Blood pressure (!) 180/90, pulse 79, temperature 99.9 F (37.7 C), temperature source Oral, resp. rate 18, weight 83.1 kg, SpO2 98 %. Constitutional: No distress . Vital signs reviewed. Awake, alert, relaxing with feet propped up on w/c, in bed, watching TV, NAD HEENT: conjugate gaze;  oral membranes moist Neck: supple Cardiovascular: RRR without murmur.  Port in chest   Respiratory: CTA Bilaterally without wheezes or rales. Normal effort    GI: BS +, non-tender, non-distended  Skin: Warm and dry.  Intact. Psych: appropriate and brighter affect; actually smiled throughout interview Musc: No edema in extremities.  No tenderness in extremities. Neurological: Alert Motor: Bilateral upper extremities: 5/5 proximal distal,  unchanged Right lower extremity: Hip flexion, knee extension 2/5, ankle dorsiflexion 4/5, no changes Left lower extremity: Hip flexion 2-/5, knee extension 2+/5, ankle dorsiflexion 4/5, no changes  Assessment/Plan: 1. 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  Physiatrist is providing close team supervision and 24 hour management of active medical problems listed below.  Physiatrist and rehab team continue to assess barriers to discharge/monitor patient progress toward functional and medical goals  Care Tool:  Bathing  Bathing activity did not occur: Refused Body parts bathed by patient: Right arm, Left arm, Chest, Abdomen, Front perineal area, Right upper leg, Left upper leg, Right lower leg, Left lower leg, Face, Buttocks   Body parts bathed by helper: Buttocks     Bathing assist Assist Level: Supervision/Verbal cueing(Bed level)     Upper Body Dressing/Undressing Upper body dressing   What is the patient wearing?: Pull over shirt    Upper body assist Assist Level: Set up assist    Lower Body Dressing/Undressing Lower body dressing      What is the patient wearing?: Pants, Incontinence brief     Lower body assist Assist for lower body dressing: Minimal Assistance - Patient > 75%(Bed level)     Toileting Toileting    Toileting assist Assist for toileting: Minimal Assistance - Patient > 75%  Transfers Chair/bed transfer  Transfers assist     Chair/bed transfer assist level: Supervision/Verbal cueing     Locomotion Ambulation   Ambulation assist   Ambulation activity did not occur: Safety/medical concerns          Walk 10 feet activity   Assist  Walk 10 feet activity did not occur: Safety/medical concerns        Walk 50 feet activity   Assist Walk 50 feet with 2 turns activity did not occur: Safety/medical  concerns         Walk 150 feet activity   Assist Walk 150 feet activity did not occur: Safety/medical concerns         Walk 10 feet on uneven surface  activity   Assist Walk 10 feet on uneven surfaces activity did not occur: Safety/medical concerns         Wheelchair     Assist Will patient use wheelchair at discharge?: Yes Type of Wheelchair: Manual    Wheelchair assist level: Independent Max wheelchair distance: 150'    Wheelchair 50 feet with 2 turns activity    Assist        Assist Level: Independent   Wheelchair 150 feet activity     Assist  Wheelchair 150 feet activity did not occur: Safety/medical concerns   Assist Level: Independent   Blood pressure (!) 180/90, pulse 79, temperature 99.9 F (37.7 C), temperature source Oral, resp. rate 18, weight 83.1 kg, SpO2 98 %.  Medical Problem List and Plan: 1.  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   Continue CIR 2.  Antithrombotics:  -DVT/anticoagulation: SCDs.  LE vascular study limited, but negative for DVT.             -antiplatelet therapy: Low-dose aspirin 81 mg daily 3. Pain Management:   Hydrocodone q 6 h prn increase to every 4 hours as needed, increased to 5-10/325 every 4 hours as needed on 9/16  Added oxy CR at noc low dose, monitor for nausea              -prn tylenol             -prn robaxin for spasms  Added tucks QID and lidocaine QID  Gabapentin 100 nightly started on 9/17, increased to 200 on 9/18 with good improvement for neuropathic pain  9/23- will try lyrica- 25 mg BID- gabapentin making him sleepy- will wean Gabapentin once on higher dose of Lyrica.   9/24- pain somewhat better this AM  9/25- Lyrica really helping- pain down to 3/10- will con't for now and try to reduce Gabapentin next week. Will increase Lyrica to 50 mg BID to cover Gabapentin dose and stop  Gabapentin on Monday.   9/28- d/c'd Gabapentin today- hopefully will not affect pain.  9/29- no change in pain control   9/27--pt reports pain is under good control today 4. Mood: Provide emotional support. Pt somewhat disheartened by multiple medical setbacks.  9/28- brighter affect, esp about going to [facility].               -antipsychotic agents: N/A 5. Neuropsych: This patient is capable of making decisions on his own behalf. 6. Skin/Wound Care: Routine skin checks 7. Fluids/Electrolytes/Nutrition: Routine in and outs 8. ID: Continue vancomycin as well as Maxipime through 07/29/2019 per infectious disease  Random level WNL on 9/19 9. Rectal ulcers/H. pylori.  Underwent EGD and flexible sigmoidoscopy 06/24/2019.  Follow-up per GI services.  Received Carafate enema x2 weeks as well as Flagyl x14 days initiated 06/26/2019 and tetracycline x14 days, completed course on 9/17.  -GI following and have discussed with pt re: continuing enemas  -continue to follow hgbs and if any significant bleeding recurs he will need flex sig per Dr. Doyne Keel note  Hemoglobin 9.1 on 9/19  9/23- ordered labs for now and tomorrow- pt reports "pool of blood" from carafate enema last night.   Continue to monitor  9/24- Hb stable- wil avoid enema x 24 hours per GI-  Will con't to attempt to do bowel program  9/25- discussed need to con't bowel program- will check with nursing that they are doing- explained it takes 3-6 weeks to train gut.  9/27--continue bowel program per primary team 10.  Acute on chronic normocytic anemia.  Patient did receive 2 units packed red blood cells 06/23/2019 and 1 unit PRBC 07/01/2019 and remains on Aranesp.    See #9. 11.  End-stage renal disease.  Continue hemodialysis per renal services.             -HD after therapies to maximize rehab participation 12.  Thoracic aortic dissection.  Status post endovascular stent placement 06/06/2019.  Follow-up per Dr. Oneida Alar 13.  Neurogenic  bowel/ bladder. I's and O's reviewed.   -He is essentially anuric.    -We will have patient sit up after administration for bowel movement, nursing to check for impaction  -pt having regular incontinent bm's most days  -defer to primary team re: ongoing adjustment of bowel program  9/29- voided this AM for first time in 3 months- bodes well. 14.  Hypertension as well as history of first-degree AV block.  Norvasc 10 mg nightly, hydralazine 100 mg every 8 hours, labetalol 300 mg twice daily.  Monitor with increased mobility in therapies.   Spironolactone 25 daily started on 9/13  Losartan 100 daily started on 9/18 per Nephro recs  Remains extremely elevated on 9/20 with hypertensive crisis, however patient missed several doses of medications yesterday.  Will need to be more diligent about administering medications, particularly given #12  Need to have better blood pressure control  9/21- BP 180-190/90s still- will d/w Nephrology  9/22- will add Clonidine #2 patch- not many other choices and recommended by Nephrology. However per chart, clonidine causes dizziness- will speak with pt prior.  9/23- per pt, CANNOT tolerate Clonidine- will d/w Nephrology.  9/24- BP better in 160s/90s was 170s-190/110s  9/27--better but still borderline/poor control, on near-max/max dose of a few meds  9/28- 145/95- much better  9/29- no new notes from Nephrology- will add PA's to call  9/30- Nephrology, base odn note, appears to be OK with his BP. Vitals:   07/23/19 0347 07/23/19 1306  BP: (!) 177/98 (!) 180/90  Pulse: 79 79  Resp: 16 18  Temp: 98.1 F (36.7 C) 99.9 F (37.7 C)  SpO2: 98% 98%  15.  Hyperlipidemia.  Lipitor 16.  Tobacco abuse.  Counseling 17.  Thrombocytopenia  Platelets 141 on 9/19  Continue to monitor 18.  Hyperglycemia  Elevated on 9/19, continue to monitor 19. Thrush  9/21- will start Diflucan and dose in evening; and Magic Mouthwash  9/22- says doing better  20- Dispo  9/28-  calling pt's PCP at [phone] Dr. Clare Charon. To ask about ramp and about Depends per pt request. Have left message for PCP to call me back- left my number. - he has ordered ramp for pt  and will order Depends- fax d/c summary, etc to [phone] -9/30- pt won't meet criteria for H/H bed/bath aide due to high level of function. He needs to learn bowel program as well, but is focused will learn at [facility]- explained won't be going to [place] for 1.5-2 weeks after d/c.   LOS: 19 days A FACE TO FACE EVALUATION WAS PERFORMED  Frank Fuller 07/23/2019, 2:03 PM

## 2019-07-23 NOTE — Progress Notes (Signed)
Occupational Therapy Session Note  Patient Details  Name: Frank Fuller MRN: [id number] Date of Birth: 03/25/1961  Today's Date: 07/23/2019 OT Individual Time: 0845-1000 OT Individual Time Calculation (min): 75 min    Short Term Goals: Week 3:  OT Short Term Goal 1 (Week 3): STG=LTG due to LOS  Skilled Therapeutic Interventions/Progress Updates:    Treatment session with focus on functional transfers and BLE strengthening/endurance.  Pt received upright in bed declining bathing/dressing but agreeable to therapy session.  Discussed pt typical routine regarding morning bowel program and bathing.  Pt completed squat pivot transfers throughout session Supervision, except final transfer in to bed at end of session with min assist due to BLE fatigue.  Engaged in 10 mins on Nustep resistance level 4 for BUE/BLE strengthening and endurance, pt requiring rest break after 5 mins.  Pt reports increased fatigue this session, requesting to focus on BLE in supine.  Engaged in bridging, heel slides, hip rotation, and hip flexion in supine with cues for controlled movements.  Pt reports need to toilet.  Returned to room and transferred to drop arm BSC with supervision.  Pt able to doff clothing, but required assistance to pull incontinence brief and pants over hips post toileting.  Returned to bed and left in supine with all needs in reach.  Therapy Documentation Precautions:  Precautions Precautions: Fall Precaution Comments: paraplegic Restrictions Weight Bearing Restrictions: No Pain: Pain Assessment Pain Scale: 0-10 Pain Score: 3  Pain Type: Chronic pain Pain Location: Rectum Pain Descriptors / Indicators: Burning Pain Onset: On-going Pain Intervention(s): Repositioned   Therapy/Group: Individual Therapy  Simonne Come 07/23/2019, 12:06 PM

## 2019-07-23 NOTE — Plan of Care (Signed)
  Problem: Consults Goal: RH GENERAL PATIENT EDUCATION Description: See Patient Education module for education specifics. Outcome: Progressing   Problem: RH BOWEL ELIMINATION Goal: RH STG MANAGE BOWEL WITH ASSISTANCE Description: STG Manage Bowel with Assistance. mod Outcome: Progressing   Problem: RH SKIN INTEGRITY Goal: RH STG SKIN FREE OF INFECTION/BREAKDOWN Description: Remain free of breakdown with mod assist Outcome: Progressing Goal: RH STG MAINTAIN SKIN INTEGRITY WITH ASSISTANCE Description: STG Maintain Skin Integrity With Assistance. Mod Outcome: Progressing   Problem: RH SAFETY Goal: RH STG ADHERE TO SAFETY PRECAUTIONS W/ASSISTANCE/DEVICE Description: STG Adhere to Safety Precautions With Assistance/Device. mod Outcome: Progressing Goal: RH STG DECREASED RISK OF FALL WITH ASSISTANCE Description: STG Decreased Risk of Fall With Assistance. Adhere to safety precautions, will have no falls w/ mod assist Outcome: Progressing   Problem: RH PAIN MANAGEMENT Goal: RH STG PAIN MANAGED AT OR BELOW PT'S PAIN GOAL Description: Less than 3 Outcome: Progressing   Problem: RH KNOWLEDGE DEFICIT GENERAL Goal: RH STG INCREASE KNOWLEDGE OF SELF CARE AFTER HOSPITALIZATION Description: Patient will be able to describe self care at home following hospitalization with cues/reminders Outcome: Progressing

## 2019-07-23 NOTE — Progress Notes (Signed)
Physical Therapy Session Note  Patient Details  Name: Frank Fuller MRN: [id number] Date of Birth: 15-Oct-1961  Today's Date: 07/23/2019 PT Individual Time: 1100-1150 PT Individual Time Calculation (min): 50 min  PT Missed Time: 10 min Missed Time Reason: fatigue  Short Term Goals: Week 3:  PT Short Term Goal 1 (Week 3): =LTG due to ELOS  Skilled Therapeutic Interventions/Progress Updates:    Pt received asleep in bed, arousable and agreeable to PT session. No complaints of pain. Bed mobility mod I. Squat pivot transfer bed to w/c with Supervision, v/c for safe setup of transfer. Manual w/c propulsion x 150 ft with use of BUE at mod I level. Squat pivot transfer to mat table with Supervision. Sit to supine mod I. Supine to prone with Supervision. Prone to quadruped with min A. Quadruped to tall-kneeling with use of BUE on bench on top of mat table and with min A for balance. Pt is able to come to tall-kneeling x 3 reps then returns to quadruped position. Pt fatigues quickly in this position. Quadruped to prone with min A. Pt is able to rest in prone x several minutes before returning to quadruped and performing tall-kneeling x 3 more repetitions. Pt reports feeling significantly fatigued following quadruped and tall kneeling positioning. Education with patient about importance of being able to achieve this position if he were to fall at home and need to be able to perform a floor transfer. Pt understanding of purpose of performing this activity and importance of avoiding falls at home. Pt declines any further activity due to feeling significantly fatigued. Prone to supine to sitting EOM with Supervision. Squat pivot transfer back to w/c then back to bed Supervision. Pt left supine in bed with needs in reach at end of session. Pt missed 10 min of scheduled therapy session due to fatigue.  Therapy Documentation Precautions:  Precautions Precautions: Fall Precaution Comments:  paraplegic Restrictions Weight Bearing Restrictions: No    Therapy/Group: Individual Therapy   Excell Seltzer, PT, DPT  07/23/2019, 12:44 PM

## 2019-07-24 ENCOUNTER — Inpatient Hospital Stay (HOSPITAL_COMMUNITY): Payer: Medicare Other | Admitting: Occupational Therapy

## 2019-07-24 ENCOUNTER — Inpatient Hospital Stay (HOSPITAL_COMMUNITY): Payer: Medicare Other | Admitting: Physical Therapy

## 2019-07-24 LAB — RENAL FUNCTION PANEL
Albumin: 2.2 g/dL — ABNORMAL LOW (ref 3.5–5.0)
Anion gap: 14 (ref 5–15)
BUN: 39 mg/dL — ABNORMAL HIGH (ref 6–20)
CO2: 23 mmol/L (ref 22–32)
Calcium: 9.2 mg/dL (ref 8.9–10.3)
Chloride: 98 mmol/L (ref 98–111)
Creatinine, Ser: 9.65 mg/dL — ABNORMAL HIGH (ref 0.61–1.24)
GFR calc Af Amer: 6 mL/min — ABNORMAL LOW (ref >60)
GFR calc non Af Amer: 5 mL/min — ABNORMAL LOW (ref >60)
Glucose, Bld: 98 mg/dL (ref 70–99)
Phosphorus: 4.7 mg/dL — ABNORMAL HIGH (ref 2.5–4.6)
Potassium: 4.8 mmol/L (ref 3.5–5.1)
Sodium: 135 mmol/L (ref 135–145)

## 2019-07-24 MED ORDER — VANCOMYCIN HCL IN DEXTROSE 750-5 MG/150ML-% IV SOLN
INTRAVENOUS | Status: AC
  Filled 2019-07-24: qty 150

## 2019-07-24 MED ORDER — HEPARIN SODIUM (PORCINE) 1000 UNIT/ML IJ SOLN
INTRAMUSCULAR | Status: AC
  Filled 2019-07-24: qty 4

## 2019-07-24 MED ORDER — HEPARIN SODIUM (PORCINE) 1000 UNIT/ML IJ SOLN
3400.0000 [IU] | Freq: Once | INTRAMUSCULAR | Status: AC
  Administered 2019-07-24: 3400 [IU]

## 2019-07-24 MED ORDER — CHLORHEXIDINE GLUCONATE CLOTH 2 % EX PADS: 6.0000 | MEDICATED_PAD | Freq: Every day | CUTANEOUS | Status: DC

## 2019-07-24 NOTE — Progress Notes (Signed)
Physical Therapy Session Note  Patient Details  Name: Frank Fuller MRN: [id number] Date of Birth: 1961/05/12  Today's Date: 07/24/2019 PT Individual Time: 0830-0945 PT Individual Time Calculation (min): 75 min   Short Term Goals: Week 3:  PT Short Term Goal 1 (Week 3): =LTG due to ELOS  Skilled Therapeutic Interventions/Progress Updates:    Pt received seated in w/c in room, agreeable to PT session. No complaints of pain. Manual w/c propulsion x 200 ft at mod I level. Pt requires minor cueing for safe management of w/c parts. Squat pivot transfer w/c to/from Nustep with Supervision. Nustep level 2 x 10 min with use of B UE/LE with focus on use of LE>UE for BLE NMR and reciprocal movement training. Squat pivot transfer w/c to/from mat table with Supervision. Sit to/from supine with Supervision. Supine BLE strengthening therex: heel slides x 10 reps, bridges 2 x 10 reps, SKFO with orange theraband 2 x 10 reps, SAQ 2 x 10 reps. Seated balance EOB performing volleyball toss against rebounder, x 22 reps, x 27 reps, x 30 reps each to fatigue. Pt requires close SBA with one instance of CGA required for dynamic sitting balance. Squat pivot transfer back to w/c Supervision. Pt requests to wash some of his clothes. Pt is setup A to start a load of laundry. Squat pivot transfer back to bed Supervision. Bed mobility mod I. Pt left supine in bed with needs in reach at end of session.  Therapy Documentation Precautions:  Precautions Precautions: Fall Precaution Comments: paraplegic Restrictions Weight Bearing Restrictions: No    Therapy/Group: Individual Therapy   Excell Seltzer, PT, DPT  07/24/2019, 12:51 PM

## 2019-07-24 NOTE — Progress Notes (Signed)
Social Work Patient ID: Nikoli Nasser, male   DOB: [birth year], 58 y.o.   MRN: [id number]   Met with patient this morning to review team conference and discuss d/c planning issues.  Pt aware that target d/c date tentatively changed to 10/6 and is agreed with this.  Updated pt on [facility] am [facility] since [facility].  Pt has been officially accepted into the program, however, they are estimating ~ 2 week wait for available bed.  Given this delay, he will need to d/c home until he can admit there.  Pt must have a ramp for home entry especially due to need for community HD transportation!  I have spoken several times with the [facility] social worker who continues to work on confirming how quickly they can get ramp in place but is concerned that this may also take several weeks.    Pt and his brother feel they can "bump up stairs" for home entry, however, have explained to both that this still won't for unless he can be there every time for HD transport.  I understand that there is only one step at brother's home.  Asked if pt might be able to go there temporarily but he does not see this as an option.    At this point, I am still awaiting word on the ramp through the [place].   Dossie Swor, LCSW

## 2019-07-24 NOTE — Progress Notes (Signed)
[facility] PHYSICAL MEDICINE & REHABILITATION PROGRESS NOTE   Subjective/Complaints:   Pt reports no issues today- just verified that going to [facility], when, how long should be at home and how long should be at [place]- strongly suggested, since brother not going to [place] for family training that he comes in here.   ROS: Patient denies fever, rash, sore throat, blurred vision, nausea, vomiting, diarrhea, cough, shortness of breath or chest pain, joint or back pain, headache, or mood change.   Objective:   No results found. Recent Labs    07/22/19 1409  WBC 4.7  HGB 8.5*  HCT 27.3*  PLT 162   Recent Labs    07/22/19 1409  NA 135  K 5.4*  CL 98  CO2 21*  GLUCOSE 89  BUN 47*  CREATININE 10.02*  CALCIUM 9.2    Intake/Output Summary (Last 24 hours) at 07/24/2019 0925 Last data filed at 07/24/2019 [id number] Gross per 24 hour  Intake 822 ml  Output -  Net 822 ml     Physical Exam: Vital Signs Blood pressure (!) 185/93, pulse 80, temperature 99.4 F (37.4 C), temperature source Oral, resp. rate 18, weight 83.1 kg, SpO2 90 %. Constitutional: No distress . Vital signs reviewed. Awake, alert, relaxing with feet propped up on w/c, in bed, watching TV in dark, NAD HEENT: conjugate gaze;  oral membranes moist Neck: supple Cardiovascular: RRR without murmur.  Port in chest   Respiratory: CTA Bilaterally without wheezes or rales. Normal effort    GI: BS +, non-tender, non-distended  Skin: Warm and dry.  Intact. Psych: less bright affect today, but otherwise, cordial Musc: No edema in extremities.  No tenderness in extremities. Neurological: Alert Motor: Bilateral upper extremities: 5/5 proximal distal, unchanged Right lower extremity: Hip flexion, knee extension 2/5, ankle dorsiflexion 4/5, no changes Left lower extremity: Hip flexion 2-/5, knee extension 2+/5, ankle dorsiflexion 4/5, no changes  Assessment/Plan: 1. 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  Physiatrist is providing close team supervision and 24 hour management of active medical problems listed below.  Physiatrist and rehab team continue to assess barriers to discharge/monitor patient progress toward functional and medical goals  Care Tool:  Bathing  Bathing activity did not occur: Refused Body parts bathed by patient: Right arm, Left arm, Chest, Abdomen, Front perineal area, Right upper leg, Left upper leg, Right lower leg, Left lower leg, Face, Buttocks   Body parts bathed by helper: Buttocks     Bathing assist Assist Level: Supervision/Verbal cueing(Bed level)     Upper Body Dressing/Undressing Upper body dressing   What is the patient wearing?: Pull over shirt    Upper body assist Assist Level: Set up assist    Lower Body Dressing/Undressing Lower body dressing      What is the patient wearing?: Pants, Incontinence brief     Lower body assist Assist for lower body dressing: Minimal Assistance - Patient > 75%(Bed level)     Toileting Toileting    Toileting assist Assist for toileting: Minimal Assistance - Patient > 75%     Transfers Chair/bed transfer  Transfers assist     Chair/bed transfer assist level: Supervision/Verbal cueing     Locomotion Ambulation   Ambulation assist   Ambulation activity did not occur: Safety/medical concerns          Walk 10 feet activity  Assist  Walk 10 feet activity did not occur: Safety/medical concerns        Walk 50 feet activity   Assist Walk 50 feet with 2 turns activity did not occur: Safety/medical concerns         Walk 150 feet activity   Assist Walk 150 feet activity did not occur: Safety/medical concerns         Walk 10 feet on uneven surface  activity   Assist Walk 10 feet on uneven surfaces activity did not occur: Safety/medical concerns          Wheelchair     Assist Will patient use wheelchair at discharge?: Yes Type of Wheelchair: Manual    Wheelchair assist level: Independent Max wheelchair distance: 150'    Wheelchair 50 feet with 2 turns activity    Assist        Assist Level: Independent   Wheelchair 150 feet activity     Assist  Wheelchair 150 feet activity did not occur: Safety/medical concerns   Assist Level: Independent   Blood pressure (!) 185/93, pulse 80, temperature 99.4 F (37.4 C), temperature source Oral, resp. rate 18, weight 83.1 kg, SpO2 90 %.  Medical Problem List and Plan: 1.  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   Continue CIR 2.  Antithrombotics:  -DVT/anticoagulation: SCDs.  LE vascular study limited, but negative for DVT.             -antiplatelet therapy: Low-dose aspirin 81 mg daily 3. Pain Management:   Hydrocodone q 6 h prn increase to every 4 hours as needed, increased to 5-10/325 every 4 hours as needed on 9/16  Added oxy CR at noc low dose, monitor for nausea              -prn tylenol             -prn robaxin for spasms  Added tucks QID and lidocaine QID  Gabapentin 100 nightly started on 9/17, increased to 200 on 9/18 with good improvement for neuropathic pain  9/23- will try lyrica- 25 mg BID- gabapentin making him sleepy- will wean Gabapentin once on higher dose of Lyrica.   9/24- pain somewhat better this AM  9/25- Lyrica really helping- pain down to 3/10- will con't for now and try to reduce Gabapentin next week. Will increase Lyrica to 50 mg BID to cover Gabapentin dose and stop Gabapentin on Monday.   9/28- d/c'd Gabapentin today- hopefully will not affect pain.  9/29- no change in pain control   9/27--pt reports pain is under good control today 4. Mood: Provide emotional support. Pt somewhat disheartened by multiple medical setbacks.  9/28-  brighter affect, esp about going to [facility].               -antipsychotic agents: N/A 5. Neuropsych: This patient is capable of making decisions on his own behalf. 6. Skin/Wound Care: Routine skin checks 7. Fluids/Electrolytes/Nutrition: Routine in and outs 8. ID: Continue vancomycin as well as Maxipime through 07/29/2019 per infectious disease  Random level WNL on 9/19 9. Rectal ulcers/H. pylori.  Underwent EGD and flexible sigmoidoscopy 06/24/2019.  Follow-up per GI services.  Received Carafate enema x2 weeks as well as Flagyl x14 days initiated 06/26/2019 and tetracycline x14 days, completed course on 9/17.  -GI following and have discussed with pt re: continuing enemas  -continue to follow hgbs and if any significant bleeding recurs he  will need flex sig per Dr. Doyne Keel note  Hemoglobin 9.1 on 9/19  9/23- ordered labs for now and tomorrow- pt reports "pool of blood" from carafate enema last night.   Continue to monitor  9/24- Hb stable- wil avoid enema x 24 hours per GI-  Will con't to attempt to do bowel program  9/25- discussed need to con't bowel program- will check with nursing that they are doing- explained it takes 3-6 weeks to train gut.  9/27--continue bowel program per primary team 10.  Acute on chronic normocytic anemia.  Patient did receive 2 units packed red blood cells 06/23/2019 and 1 unit PRBC 07/01/2019 and remains on Aranesp.    See #9. 11.  End-stage renal disease.  Continue hemodialysis per renal services.             -HD after therapies to maximize rehab participation 12.  Thoracic aortic dissection.  Status post endovascular stent placement 06/06/2019.  Follow-up per Dr. Oneida Alar 13.  Neurogenic bowel/ bladder. I's and O's reviewed.   -He is essentially anuric.    -We will have patient sit up after administration for bowel movement, nursing to check for impaction  -pt having regular incontinent bm's most days  -defer to primary team re: ongoing adjustment of bowel  program  9/29- voided this AM for first time in 3 months- bodes well. 14.  Hypertension as well as history of first-degree AV block.  Norvasc 10 mg nightly, hydralazine 100 mg every 8 hours, labetalol 300 mg twice daily.  Monitor with increased mobility in therapies.   Spironolactone 25 daily started on 9/13  Losartan 100 daily started on 9/18 per Nephro recs  Remains extremely elevated on 9/20 with hypertensive crisis, however patient missed several doses of medications yesterday.  Will need to be more diligent about administering medications, particularly given #12  Need to have better blood pressure control  9/21- BP 180-190/90s still- will d/w Nephrology  9/22- will add Clonidine #2 patch- not many other choices and recommended by Nephrology. However per chart, clonidine causes dizziness- will speak with pt prior.  9/23- per pt, CANNOT tolerate Clonidine- will d/w Nephrology.  9/24- BP better in 160s/90s was 170s-190/110s  9/27--better but still borderline/poor control, on near-max/max dose of a few meds  9/28- 145/95- much better  9/29- no new notes from Nephrology- will add PA's to call  9/30- Nephrology, based on note, appears to be OK with his BP. Vitals:   07/23/19 2000 07/24/19 0501  BP: (!) 178/106 (!) 185/93  Pulse: 83 80  Resp: 18 18  Temp: 97.9 F (36.6 C) 99.4 F (37.4 C)  SpO2: 98% 90%  15.  Hyperlipidemia.  Lipitor 16.  Tobacco abuse.  Counseling 17.  Thrombocytopenia  Platelets 141 on 9/19  Continue to monitor 18.  Hyperglycemia  Elevated on 9/19, continue to monitor 19. Thrush  9/21- will start Diflucan and dose in evening; and Magic Mouthwash  9/22- says doing better  10/1- resolved  20- Dispo  9/28- calling pt's PCP at [phone] Dr. Clare Charon. To ask about ramp and about Depends per pt request. Have left message for PCP to call me back- left my number. - he has ordered ramp for pt and will order Depends- fax d/c summary, etc to  [phone] -9/30- pt won't meet criteria for H/H bed/bath aide due to high level of function. He needs to learn bowel program as well, but is focused will learn at [facility]- explained won't be going to [place]  for 1.5-2 weeks after d/c. 10/1- suggested brother do family training here.    LOS: 20 days A FACE TO FACE EVALUATION WAS PERFORMED  Cherell Colvin 07/24/2019, 9:25 AM

## 2019-07-24 NOTE — Progress Notes (Signed)
Occupational Therapy Session Note  Patient Details  Name: Frank Fuller MRN: [id number] Date of Birth: 01-03-1961  Today's Date: 07/24/2019 OT Individual Time: 1100-1144 OT Individual Time Calculation (min): 44 min  and Today's Date: 07/24/2019 OT Missed Time: 16 Minutes Missed Time Reason: Patient fatigue   Short Term Goals: Week 3:  OT Short Term Goal 1 (Week 3): STG=LTG due to LOS  Skilled Therapeutic Interventions/Progress Updates:    Treatment session with focus on functional mobility with w/c and sit <>stand for LB strengthening.  Pt received supine in bed agreeable to therapy session.  Pt donned shoes with setup and transferred to w/c Supervision squat pivot.  Pt propelled w/c to laundry room and engaged in moving laundry from washing machine to dryer.  Engaged in education regarding improved technique and setup to increase ability to retreive items from top loader washing machine.  Pt utilized reacher to obtain items.  Engaged in sit > stand and squats in Standing Frame.  Did not utilize harness this session, however pt heavily relying on UE strength on top of frame and knee support.  Pt able to complete 2 sets of 10 squats.  Discussed energy conservation and awareness of energy expenditure to ensure safety when home.  Pt reports fatigue and requested to return to room.  Min cues for locking w/c brakes as pt not securely locking x2.  Pt left supine in bed with all needs in reach.  Therapy Documentation Precautions:  Precautions Precautions: Fall Precaution Comments: paraplegic Restrictions Weight Bearing Restrictions: No General: General OT Amount of Missed Time: 16 Minutes Pain:  Pt with no c/o pain   Therapy/Group: Individual Therapy  Simonne Come 07/24/2019, 12:18 PM

## 2019-07-24 NOTE — Patient Care Conference (Signed)
Inpatient RehabilitationTeam Conference and Plan of Care Update Date: 07/23/2019   Time: 9:45 AM    Patient Name: Frank Fuller      Medical Record Number: [id number]  Date of Birth: 1961-01-19 Sex: Male         Room/Bed: 4M04C/4M04C-01 Payor Info: Payor: MEDICARE / Plan: MEDICARE PART A AND B / Product Type: *No Product type* /    Admit Date/Time:  07/04/2019  5:39 PM  Primary Diagnosis:  Paraplegia [facility])  Patient Active Problem List   Diagnosis Date Noted  . Neurogenic bowel   . Renovascular hypertension   . Neuropathic pain   . Hypertensive crisis   . Essential hypertension   . Acute on chronic anemia   . Bacteremia   . Reactive depression   . Rectal bleeding 07/04/2019  . Vertebral osteomyelitis ([place]) 07/04/2019  . Spinal cord infarction ([place]) 07/04/2019  . Discitis 07/04/2019  . 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 Paraplegia ([place]) 07/03/2019  . H. pylori infection   . Gastric ulcer   . Rectal ulceration   . Lumbar discitis 06/16/2019  . Ileus ([place]) 06/13/2019  . Proctitis 06/13/2019  . H/O endovascular stent graft for abdominal aortic aneurysm 06/13/2019  . Sepsis ([place]) 06/13/2019  . Sepsis ([place]) 06/13/2019  . Aortic dissection ([place]) 06/03/2019  . Encounter for central line placement   . Hypertensive emergency   . Aortic dissection distal to left subclavian ([place]) 05/26/2019  . Problem with dialysis access ([place]) 04/22/2019  . Anemia 04/22/2019  . MSSA bacteremia 04/15/2019  . Pneumonia 04/14/2019  . Hospital-acquired pneumonia 04/14/2019  . ESRD needing dialysis ([place]) 11/11/2018  . Hemoptysis   . Pulmonary edema 08/02/2017  . Acute kidney injury superimposed on chronic kidney disease ([place]) 07/20/2017  . ESRD on dialysis ([place]) 07/19/2017  . ESRD on hemodialysis ([place]) 07/16/2017  . AAA (abdominal aortic aneurysm) without rupture ([place]) 07/15/2017  . Hypertensive urgency 07/14/2017  . Acute encephalopathy   . Wide-complex tachycardia ([place])   . CVA  (cerebral vascular accident) ([place]) 12/13/2016  . Hyponatremia 12/13/2016  . SVT (supraventricular tachycardia) ([place])   . 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 ARF (acute renal failure) ([place]) 12/10/2016  . Thrombocytopenia ([place]) 12/10/2016  . Elevated troponin 12/10/2016  . Accelerated hypertension 12/10/2016  . Hiccups 12/10/2016  . Elevated bilirubin 12/10/2016  . Nausea with vomiting 12/10/2016  . Uremia 12/10/2016    Expected Discharge Date: Expected Discharge Date: (TBD)  Team Members Present: Physician leading conference: Dr. Alysia Penna Social Worker Present: Lennart Pall, LCSW Nurse Present: Rayetta Pigg, RN PT Present: Excell Seltzer, PT OT Present: Amy Rounds, OT SLP Present: Other (comment)(Erin Tamala Julian, CF-SLP) PPS Coordinator present : Gunnar Fusi, SLP     Current Status/Progress Goal Weekly Team Focus  Bowel/Bladder   Pt anuric (did void large amount during the night of the 28th into the 29th) LBM 09/29  Pt refuses Sucralfate enema  Encourage and educate pt to carafate enema pt continues with HD  assist with toileting prn   Swallow/Nutrition/ Hydration             ADL's   Supervision-mod I squat pivot transfers, per pt report, Supervision toileting, set-up/ supervision bathing/dressing  Supervision-mod I overall w/c level  ADL/IADL re-training, SCI education, family education and d/c planning   Mobility   mod I bed mobility, Supervision squat pivot transfers, mod I w/c mobility, min A sit to stand  Supervision overall, gait goal d/c  d/c planning, LE NMR, SCI edu  Communication             Safety/Cognition/ Behavioral Observations            Pain   C/O pain to mid buttocks Managed with Norco prn  pain <=3/10  assess pain qshift and prn   Skin   Skin intact  Skin remains intact no s/sx of infection  Assess skin qshift and prn      *See Care Plan and progress notes for long and short-term goals.     Barriers to Discharge  Current  Status/Progress Possible Resolutions Date Resolved   Nursing                  PT  Decreased caregiver support;Inaccessible home environment;Lack of/limited family support;Neurogenic Bowel & Bladder;Incontinence;Home environment access/layout                 OT                  SLP                SW                Discharge Planning/Teaching Needs:  Pt now agreeable to admit to SCI inpatient program with [place] in [place].  May need to d/c to his home for a short period until a bed becomes available there.  Teachingplanned for this week   Team Discussion:  Still refusing enemas;  IV abx being done in HD.  Some emptying of bladder.  Self - limiting and still having rectal pain.  B/d at supervision.  S to mod ind with w/c mobility.  Min-mod A to standing a few seconds.  Poor functional problem solving.  SW reports continue to work on d/c plans of home/ [facility] and will keep team posted.  Revisions to Treatment Plan:  NA    Medical Summary Current Status: voided x1 on own; needs to learn bowel program- but wants depends Weekly Focus/Goal: finish IV ABX before d./c  Barriers to Discharge: Behavior;Incontinence;Home enviroment access/layout;Neurogenic Bowel & Bladder;Medication compliance  Barriers to Discharge Comments: n/a Possible Resolutions to Barriers: family training- brother has refused   Continued Need for Acute Rehabilitation Level of Care: The patient requires daily medical management by a physician with specialized training in physical medicine and rehabilitation for the following reasons: Direction of a multidisciplinary physical rehabilitation program to maximize functional independence : Yes Medical management of patient stability for increased activity during participation in an intensive rehabilitation regime.: Yes Analysis of laboratory values and/or radiology reports with any subsequent need for medication adjustment and/or medical intervention. : Yes   I attest that I  was present, lead the team conference, and concur with the assessment and plan of the team.   Naomii Kreger 07/25/2019, 8:10 AM   Team conference was held via web/ teleconference due to [place] - 19

## 2019-07-24 NOTE — Plan of Care (Signed)
  Problem: Consults Goal: RH GENERAL PATIENT EDUCATION Description: See Patient Education module for education specifics. Outcome: Progressing   Problem: RH BOWEL ELIMINATION Goal: RH STG MANAGE BOWEL WITH ASSISTANCE Description: STG Manage Bowel with Assistance. mod Outcome: Progressing Flowsheets (Taken 07/24/2019 1333) STG: Pt will manage bowels with assistance: 4-Minimum assistance   Problem: RH SKIN INTEGRITY Goal: RH STG SKIN FREE OF INFECTION/BREAKDOWN Description: Remain free of breakdown with mod assist Outcome: Progressing Goal: RH STG MAINTAIN SKIN INTEGRITY WITH ASSISTANCE Description: STG Maintain Skin Integrity With Assistance. Mod Outcome: Progressing Flowsheets (Taken 07/24/2019 1333) STG: Maintain skin integrity with assistance: 5-Supervision/cueing   Problem: RH SAFETY Goal: RH STG ADHERE TO SAFETY PRECAUTIONS W/ASSISTANCE/DEVICE Description: STG Adhere to Safety Precautions With Assistance/Device. mod Outcome: Progressing Flowsheets (Taken 07/24/2019 1333) STG:Pt will adhere to safety precautions with assistance/device: 4-Minimal assistance Goal: RH STG DECREASED RISK OF FALL WITH ASSISTANCE Description: STG Decreased Risk of Fall With Assistance. Adhere to safety precautions, will have no falls w/ mod assist Outcome: Progressing Flowsheets (Taken 07/24/2019 1333) [id number] risk of fall  with assistance/device: 4-Minimal assistance   Problem: RH PAIN MANAGEMENT Goal: RH STG PAIN MANAGED AT OR BELOW PT'S PAIN GOAL Description: Less than 3 Outcome: Progressing   Problem: RH KNOWLEDGE DEFICIT GENERAL Goal: RH STG INCREASE KNOWLEDGE OF SELF CARE AFTER HOSPITALIZATION Description: Patient will be able to describe self care at home following hospitalization with cues/reminders Outcome: Progressing

## 2019-07-25 ENCOUNTER — Inpatient Hospital Stay (HOSPITAL_COMMUNITY): Payer: Medicare Other

## 2019-07-25 ENCOUNTER — Inpatient Hospital Stay (HOSPITAL_COMMUNITY): Payer: Medicare Other | Admitting: Occupational Therapy

## 2019-07-25 LAB — CBC WITH DIFFERENTIAL/PLATELET
Abs Immature Granulocytes: 0.01 10*3/uL (ref 0.00–0.07)
Basophils Absolute: 0 10*3/uL (ref 0.0–0.1)
Basophils Relative: 1 %
Eosinophils Absolute: 0.2 10*3/uL (ref 0.0–0.5)
Eosinophils Relative: 5 %
HCT: 29.2 % — ABNORMAL LOW (ref 39.0–52.0)
Hemoglobin: 9.5 g/dL — ABNORMAL LOW (ref 13.0–17.0)
Immature Granulocytes: 0 %
Lymphocytes Relative: 22 %
Lymphs Abs: 0.9 10*3/uL (ref 0.7–4.0)
MCH: 30.8 pg (ref 26.0–34.0)
MCHC: 32.5 g/dL (ref 30.0–36.0)
MCV: 94.8 fL (ref 80.0–100.0)
Monocytes Absolute: 0.7 10*3/uL (ref 0.1–1.0)
Monocytes Relative: 17 %
Neutro Abs: 2.3 10*3/uL (ref 1.7–7.7)
Neutrophils Relative %: 55 %
Platelets: 132 10*3/uL — ABNORMAL LOW (ref 150–400)
RBC: 3.08 MIL/uL — ABNORMAL LOW (ref 4.22–5.81)
RDW: 18.9 % — ABNORMAL HIGH (ref 11.5–15.5)
WBC: 4 10*3/uL (ref 4.0–10.5)
nRBC: 0 % (ref 0.0–0.2)

## 2019-07-25 NOTE — Progress Notes (Signed)
Subjective: no cos , for hd tomor /  Objective Vital signs in last 24 hours: Vitals:   07/24/19 2230 07/24/19 2300 07/24/19 2339 07/25/19 0411  BP: (!) 200/102 (!) 187/100 (!) 190/97 (!) 189/97  Pulse: 78 80 82 79  Resp: 16 16 16 19   Temp:   98.2 F (36.8 C) 98.4 F (36.9 C)  TempSrc:   Oral Oral  SpO2:   98% 98%  Weight:   84.6 kg    Weight change:   Physical Exam General: alert , Well developed,  male in NAD Heart: RRR, no murmurs, rubs or gallops Lungs: Lungs CTA bilaterally without wheezing, rhonchi or rales Abdomen: Abdomen soft, non-tender, non-distended. + BS Extremities: No peripheral edema. Dialysis Access:  R IJ TDC without erythema/drainage   OP Dialysis Orders: [facility] 4h 33min 400/1.5 87.5kg 2/2.25 P2 R TDC Hep none - Parsabiv 2.5mg  IV q HD  - Calcitriol 1.73mcg PO q HD  Problem/Plan: 1. Spinal cord infarct/ paraperesis: per spinal MRI 8/23at T9- T11, following presentation with inability to move his legs. Neuro, NSurg and VVS consulted, all felt etiology was likely spinal artery injury related to aortic dissection procedure. All recommended supportive care and hope for recovery. Per NSurg, no indication for surgery. Continue treatment with aspirin recommended. Now admitted to CIR for ongoing physical therapy/Occupational Therapy along with psychotherapy and pain management. 2. ESRD:Continue HD on TTS schedule. HD tomorrow .  3. RecentType B aortic dissection (s/p stent graft repair 8/14):Goal SBP per VVS 120-170. Known severe resistant HTN. Continue to avoid intradialytic hypotension.  4. HTN/volume: Blood pressures remain high but overall within goal of 120-170. Now on amlodipine 10, hydralazine 100 tid, labetalol 300 bid and losartan as prior. Spironolactone 25 started here. New dry wt around 83- 85kg.  plan  2 l uf  5. L4-L5 lumbar discitis: s/p recent aortic stentgraft. Plans noted for intravenous vancomycin and cefepime until  07/29/2019.  6. Hematochezia- recurrent:Flex sig 9/1 showedstigmata of recent bleedinganddiffusely ulcerated rectum.EGD with fewnon-bleeding erosions in gastric antrum. Rx'd w/ miralax, carafate < 2 weeks. Repeat Flex sig done 9/4 withhemostatic spray to entire rectum.  7. Anemia(ESRD + ABLA):Hgb stable today at 9.5. On Aranesp 150 q Sat. Transfuse when hemoglobin <7 8. Secondary hyperparathyroidism -Phos 6.1 > 5.8>4.7 . Binders changed to Fosrenol d/t concern that Auryxia irritating his bowels. Calcium 9.2, corrected 10.7. Not currently on calcitriol. Use low calcium bath.Parsabiv not on hospital formulary. 9. Nutrition: Significant hypoalbuminemia noted likely following acute hospitalization/surgery. Continue renal diet with ONS. 10. Recent MSSA bacteremia/AVG infection:S/p AVG removal June/ July andAncefcoursecompleted. Reports plans for new AVF after discharge. 11. Depression -following recent sequence of clinical events. On citalopram.  Ernest Haber, PA-C [facility] [phone] 07/25/2019,3:40 PM  LOS: 21 days   Labs: Basic Metabolic Panel: Recent Labs  Lab 07/21/19 0548 07/22/19 1409 07/24/19 1959  NA 136 135 135  K 5.0 5.4* 4.8  CL 97* 98 98  CO2 24 21* 23  GLUCOSE 91 89 98  BUN 34* 47* 39*  CREATININE 8.27* 10.02* 9.65*  CALCIUM 9.6 9.2 9.2  PHOS 5.3* 5.6* 4.7*   Liver Function Tests: Recent Labs  Lab 07/21/19 0548 07/22/19 1409 07/24/19 1959  ALBUMIN 2.4* 2.3* 2.2*   No results for input(s): LIPASE, AMYLASE in the last 168 hours. No results for input(s): AMMONIA in the last 168 hours. CBC: Recent Labs  Lab 07/21/19 0548 07/22/19 1409 07/25/19 0621  WBC 4.9 4.7 4.0  NEUTROABS 2.7  --  2.3  HGB 9.9* 8.5* 9.5*  HCT 32.0* 27.3* 29.2*  MCV 95.5 98.2 94.8  PLT 168 162 132*   Cardiac Enzymes: No results for input(s): CKTOTAL, CKMB, CKMBINDEX, TROPONINI in the last 168 hours. CBG: No results for input(s): GLUCAP in  the last 168 hours.  Studies/Results: No results found. Medications: . ceFEPime (MAXIPIME) IV 2 g (07/25/19 0021)  . vancomycin 750 mg (07/24/19 2226)   . amLODipine  10 mg Oral QHS  . atorvastatin  20 mg Oral q1800  . Chlorhexidine Gluconate Cloth  6 each Topical Q0600  . citalopram  10 mg Oral Daily  . darbepoetin (ARANESP) injection - DIALYSIS  150 mcg Intravenous Q Sat-HD  . feeding supplement (NEPRO CARB STEADY)  237 mL Oral BID BM  . hydrALAZINE  100 mg Oral Q8H  . labetalol  300 mg Oral BID  . lanthanum  1,000 mg Oral TID WC  . lidocaine  1 application Topical Once  . losartan  100 mg Oral Daily  . multivitamin  1 tablet Oral QHS  . oxyCODONE  10 mg Oral QHS  . pantoprazole  40 mg Oral BID  . pregabalin  50 mg Oral BID  . spironolactone  25 mg Oral Daily  . witch hazel-glycerin   Topical QID

## 2019-07-25 NOTE — Progress Notes (Signed)
Occupational Therapy Weekly Progress Note  Patient Details  Name: Frank Fuller MRN: [id number] Date of Birth: [birth year]  Beginning of progress report period: July 17, 2019 End of progress report period: July 25, 2019  Today's Date: 07/25/2019 OT Individual [phone] and [id number] OT Individual Time Calculation (min): 12 min and 33 min (missed 30 mins)   Short term goals not set due to estimated length of stay.  Pt is making steady progress towards goals, however continues to be limited due to decreased endurance and self-limiting.  Pt able to complete squat pivot transfers bed <> w/c and bed <> drop arm BSC with supervision. Pt occasionally forgets to lock w/c brakes, therefore have encouraged pt to get in habit of double checking prior to transfers.  Pt able to complete toileting tasks with increased time and lateral leans to manage clothing.  Discharge plan modified as pt plans to d/c to [place] in [place], however bed not available.  [name] working with pt and brother regarding safe d/c plan until transfer to [place].    Patient continues to demonstrate the following deficits: muscle weakness and muscle paralysis, decreased cardiorespiratoy endurance and decreased sitting balance, decreased standing balance, decreased postural control and decreased balance strategies and therefore will continue to benefit from skilled OT intervention to enhance overall performance with BADL and Reduce care partner burden.  See Patient's Care Plan for progression toward long term goals.  Patient progressing toward long term goals..  Continue plan of care.  Skilled Therapeutic Interventions/Progress Updates:    1) Pt received supine in bed stating "weren't you just here". Provided pt with schedule, to which he notes that he recently just completed PT session and needs a longer rest between sessions.  Discussed current goals and need to see pt complete various self-care tasks to which pt reports already  bathing/dressing early this AM.  Pt continued to refuse therapy at this time, will attempt to return later.  2) Treatment session with focus on toilet transfers and toileting tasks.  Pt completed squat pivot transfer bed > drop arm BSC with Supervision.  Pt able to doff pants with lateral leans and pushing up from [facility] without physical assistance this session.  Returned to bed squat pivot and then transferred to w/c squat pivot with supervision.  Discussed double checking w/c brakes to ensure safety during transfers as pt does not always secure w/c brakes.  Pt requested to address BLE strengthening.  Pt completed 4 sets of 2 mins on Kinetron with focus on BLE strengthening and motor control.  Discussed progress towards goals during rest breaks and focus on personal goals and not worrying about what other pt's are doing.  Pt propelled w/c back to room and transferred back to EOB with supervision.  Therapy Documentation Precautions:  Precautions Precautions: Fall Precaution Comments: paraplegic Restrictions Weight Bearing Restrictions: No General:   Vital Signs: Therapy Vitals Temp: 98.4 F (36.9 C) Temp Source: Oral Pulse Rate: 79 Resp: 19 BP: (!) 189/97 Patient Position (if appropriate): Lying Oxygen Therapy SpO2: 98 % O2 Device: Room Air Pain:  Pt with no c/o pain   Therapy/Group: Individual Therapy  Frank Fuller 07/25/2019, 7:24 AM

## 2019-07-25 NOTE — Progress Notes (Signed)
Physical Therapy Session Note  Patient Details  Name: Frank Fuller MRN: [id number] Date of Birth: 05-12-1961  Today's Date: 07/25/2019 PT Individual Time: 0800-0900 PT Individual Time Calculation (min): 60 min   Short Term Goals: Week 1:  PT Short Term Goal 1 (Week 1): Pt will perform supine<>sit with CGA PT Short Term Goal 1 - Progress (Week 1): Met PT Short Term Goal 2 (Week 1): Pt will perform bed<>chair transfers with CGA PT Short Term Goal 2 - Progress (Week 1): Met PT Short Term Goal 3 (Week 1): Pt will initiate sit<>stand transfers and gait training PT Short Term Goal 3 - Progress (Week 1): Met PT Short Term Goal 4 (Week 1): Pt will demonstrate understanding of w/c parts with min cuing and min assist to set-up for bed<>chair transfers PT Short Term Goal 4 - Progress (Week 1): Met Week 2:  PT Short Term Goal 1 (Week 2): Pt will perform bed mobility at mod I level PT Short Term Goal 1 - Progress (Week 2): Met PT Short Term Goal 2 (Week 2): Pt will perform least restrictive transfers at Supervision consistently PT Short Term Goal 2 - Progress (Week 2): Met PT Short Term Goal 3 (Week 2): Pt will perform sit to stand with min A PT Short Term Goal 3 - Progress (Week 2): Met Week 3:  PT Short Term Goal 1 (Week 3): =LTG due to ELOS  Skilled Therapeutic Interventions/Progress Updates:   Pain  4/10 bottom and feet  States this is premorbid and related to [employer].  Treatment to tolerance. Treatment to tolerance wc propulsion 75f x 3 w/bilat LE's for hamstring/quad strengtheninig and bipedal motion.  wc to/from mat via lateral scoot mod I Sit to/from supine and lateral scoot Independent.  Seated LAQ's x 20 Seated hip flexion x 10 on L only Supine clamshells 2x10              bridgeing 2x10             Hip abd/add 2x12  sidelying w/powderboard bilat hip flexion 2x10, hamstring curls 2x10 (2-/5 hamstrings RLE) , hip extension 2x10 Supine bridge w/red theraball for  proprioceptive training , holds briefly 2-3sec. X 8  mat to wc lateral scoot mod I. wc propulsion x 116fw/bilat LE's.  Pt returned to room and left w/nursing tech who was in room changing bed.    Assessment:  Pt w/improving LE strength.  Limits self w/standing/gait activities due to fear of having "accident" w/exertion.     Therapy Documentation Precautions:  Precautions Precautions: Fall Precaution Comments: paraplegic Restrictions Weight Bearing Restrictions: No  Therapy/Group: Individual Therapy  BaCallie FieldingPT   BaJerrilyn Cairo0/11/2018, 12:53 PM

## 2019-07-25 NOTE — Progress Notes (Signed)
[place] PHYSICAL MEDICINE & REHABILITATION PROGRESS NOTE   Subjective/Complaints:   Pt reports having difficulty getting ahold of PCP- also "wants a walker without wheels".  Has to talk to PT about walker.   ROS: Patient denies fever, rash, sore throat, blurred vision, nausea, vomiting, diarrhea, cough, shortness of breath or chest pain, joint or back pain, headache, or mood change.   Objective:   No results found. Recent Labs    07/22/19 1409 07/25/19 0621  WBC 4.7 4.0  HGB 8.5* 9.5*  HCT 27.3* 29.2*  PLT 162 132*   Recent Labs    07/22/19 1409 07/24/19 1959  NA 135 135  K 5.4* 4.8  CL 98 98  CO2 21* 23  GLUCOSE 89 98  BUN 47* 39*  CREATININE 10.02* 9.65*  CALCIUM 9.2 9.2    Intake/Output Summary (Last 24 hours) at 07/25/2019 0909 Last data filed at 07/24/2019 2339 Gross per 24 hour  Intake 422 ml  Output 2000 ml  Net -1578 ml     Physical Exam: Vital Signs Blood pressure (!) 189/97, pulse 79, temperature 98.4 F (36.9 C), temperature source Oral, resp. rate 19, weight 84.6 kg, SpO2 98 %. Constitutional: No distress . Vital signs reviewed. Awake, alert, relaxing with feet propped up on w/c,in bed; bright affect, NAD HEENT: conjugate gaze;  oral membranes moist Neck: supple Cardiovascular: RRR without murmur.  Port in chest   Respiratory: CTA Bilaterally without wheezes or rales. Normal effort    GI: BS +, non-tender, non-distended  Skin: Warm and dry.  Intact. Psych: less bright affect today, but otherwise, cordial Musc: No edema in extremities.  No tenderness in extremities. Neurological: Alert Motor: Bilateral upper extremities: 5/5 proximal distal, unchanged Right lower extremity: Hip flexion, knee extension 2/5, ankle dorsiflexion 4/5, no changes Left lower extremity: Hip flexion 2-/5, knee extension 2+/5, ankle dorsiflexion 4/5, no changes  Assessment/Plan: 1. Functional deficits secondary to T10 Spinal cord infarct - T 10 ASIA C which require  3+ hours per day of interdisciplinary therapy in a comprehensive inpatient rehab setting.  Physiatrist is providing close team supervision and 24 hour management of active medical problems listed below.  Physiatrist and rehab team continue to assess barriers to discharge/monitor patient progress toward functional and medical goals  Care Tool:  Bathing  Bathing activity did not occur: Refused Body parts bathed by patient: Right arm, Left arm, Chest, Abdomen, Front perineal area, Right upper leg, Left upper leg, Right lower leg, Left lower leg, Face, Buttocks   Body parts bathed by helper: Buttocks     Bathing assist Assist Level: Supervision/Verbal cueing(Bed level)     Upper Body Dressing/Undressing Upper body dressing   What is the patient wearing?: Pull over shirt    Upper body assist Assist Level: Set up assist    Lower Body Dressing/Undressing Lower body dressing      What is the patient wearing?: Pants, Incontinence brief     Lower body assist Assist for lower body dressing: Minimal Assistance - Patient > 75%(Bed level)     Toileting Toileting    Toileting assist Assist for toileting: Contact Guard/Touching assist     Transfers Chair/bed transfer  Transfers assist     Chair/bed transfer assist level: Supervision/Verbal cueing     Locomotion Ambulation   Ambulation assist   Ambulation activity did not occur: Safety/medical concerns          Walk 10 feet activity   Assist  Walk 10 feet activity did not occur:  Safety/medical concerns        Walk 50 feet activity   Assist Walk 50 feet with 2 turns activity did not occur: Safety/medical concerns         Walk 150 feet activity   Assist Walk 150 feet activity did not occur: Safety/medical concerns         Walk 10 feet on uneven surface  activity   Assist Walk 10 feet on uneven surfaces activity did not occur: Safety/medical concerns         Wheelchair     Assist Will  patient use wheelchair at discharge?: Yes Type of Wheelchair: Manual    Wheelchair assist level: Independent Max wheelchair distance: 150'    Wheelchair 50 feet with 2 turns activity    Assist        Assist Level: Independent   Wheelchair 150 feet activity     Assist  Wheelchair 150 feet activity did not occur: Safety/medical concerns   Assist Level: Independent   Blood pressure (!) 189/97, pulse 79, temperature 98.4 F (36.9 C), temperature source Oral, resp. rate 19, weight 84.6 kg, SpO2 98 %.  Medical Problem List and Plan: 1.  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   Continue CIR 2.  Antithrombotics:  -DVT/anticoagulation: SCDs.  LE vascular study limited, but negative for DVT.             -antiplatelet therapy: Low-dose aspirin 81 mg daily 3. Pain Management:   Hydrocodone q 6 h prn increase to every 4 hours as needed, increased to 5-10/325 every 4 hours as needed on 9/16  Added oxy CR at noc low dose, monitor for nausea              -prn tylenol             -prn robaxin for spasms  Added tucks QID and lidocaine QID  Gabapentin 100 nightly started on 9/17, increased to 200 on 9/18 with good improvement for neuropathic pain  9/23- will try lyrica- 25 mg BID- gabapentin making him sleepy- will wean Gabapentin once on higher dose of Lyrica.   9/24- pain somewhat better this AM  9/25- Lyrica really helping- pain down to 3/10- will con't for now and try to reduce Gabapentin next week. Will increase Lyrica to 50 mg BID to cover Gabapentin dose and stop Gabapentin on Monday.   9/28- d/c'd Gabapentin today- hopefully will not affect pain.  9/29- no change in pain control   9/27--pt reports pain is under good control today 4. Mood: Provide emotional support. Pt somewhat disheartened by multiple medical setbacks.  9/28- brighter affect, esp about going to  [facility].               -antipsychotic agents: N/A 5. Neuropsych: This patient is capable of making decisions on his own behalf. 6. Skin/Wound Care: Routine skin checks 7. Fluids/Electrolytes/Nutrition: Routine in and outs 8. ID: Continue vancomycin as well as Maxipime through 07/29/2019 per infectious disease  Random level WNL on 9/19 9. Rectal ulcers/H. pylori.  Underwent EGD and flexible sigmoidoscopy 06/24/2019.  Follow-up per GI services.  Received Carafate enema x2 weeks as well as Flagyl x14 days initiated 06/26/2019 and tetracycline x14 days, completed course on 9/17.  -GI following and have discussed with pt re: continuing enemas  -continue to follow hgbs and if any significant bleeding recurs he will need flex sig per Dr. Doyne Keel note  Hemoglobin 9.1 on 9/19  9/23- ordered labs for now and tomorrow- pt reports "pool of blood" from carafate enema last night.   Continue to monitor  9/24- Hb stable- wil avoid enema x 24 hours per GI-  Will con't to attempt to do bowel program  9/25- discussed need to con't bowel program- will check with nursing that they are doing- explained it takes 3-6 weeks to train gut.  9/27--continue bowel program per primary team 10.  Acute on chronic normocytic anemia.  Patient did receive 2 units packed red blood cells 06/23/2019 and 1 unit PRBC 07/01/2019 and remains on Aranesp.    See #9. 11.  End-stage renal disease.  Continue hemodialysis per renal services.             -HD after therapies to maximize rehab participation 12.  Thoracic aortic dissection.  Status post endovascular stent placement 06/06/2019.  Follow-up per Dr. Oneida Alar 13.  Neurogenic bowel/ bladder. I's and O's reviewed.   -He is essentially anuric.    -We will have patient sit up after administration for bowel movement, nursing to check for impaction  -pt having regular incontinent bm's most days  -defer to primary team re: ongoing adjustment of bowel program  9/29- voided this AM for  first time in 3 months- bodes well. 14.  Hypertension as well as history of first-degree AV block.  Norvasc 10 mg nightly, hydralazine 100 mg every 8 hours, labetalol 300 mg twice daily.  Monitor with increased mobility in therapies.   Spironolactone 25 daily started on 9/13  Losartan 100 daily started on 9/18 per Nephro recs  Remains extremely elevated on 9/20 with hypertensive crisis, however patient missed several doses of medications yesterday.  Will need to be more diligent about administering medications, particularly given #12  Need to have better blood pressure control  9/21- BP 180-190/90s still- will d/w Nephrology  9/22- will add Clonidine #2 patch- not many other choices and recommended by Nephrology. However per chart, clonidine causes dizziness- will speak with pt prior.  9/23- per pt, CANNOT tolerate Clonidine- will d/w Nephrology.  9/24- BP better in 160s/90s was 170s-190/110s  9/27--better but still borderline/poor control, on near-max/max dose of a few meds  9/28- 145/95- much better  9/29- no new notes from Nephrology- will add PA's to call  9/30- Nephrology, based on note, appears to be OK with his BP.  10/2- BP 180s-190s over 90s- BP too high- will talk with pt about Clonidine  Vitals:   07/24/19 2339 07/25/19 0411  BP: (!) 190/97 (!) 189/97  Pulse: 82 79  Resp: 16 19  Temp: 98.2 F (36.8 C) 98.4 F (36.9 C)  SpO2: 98% 98%  15.  Hyperlipidemia.  Lipitor 16.  Tobacco abuse.  Counseling 17.  Thrombocytopenia  Platelets 141 on 9/19  Continue to monitor 18.  Hyperglycemia  Elevated on 9/19, continue to monitor 19. Thrush  9/21- will start Diflucan and dose in evening; and Magic Mouthwash  9/22- says doing better  10/1- resolved  20- Dispo  9/28- calling pt's PCP at [phone] Dr. Clare Charon. To ask about ramp and about Depends per pt request. Have left message for PCP to call me back- left my number. - he has ordered ramp for pt and will order Depends-  fax d/c summary, etc to [phone] -9/30- pt won't meet criteria for H/H bed/bath aide due to high level of function. He needs to learn bowel program as well, but is focused will learn at  [place]- explained won't be going to [place] for 1.5-2 weeks after d/c. 10/1- suggested brother do family training here.   10/2- ramp to be put in early next week- maybe Monday.   LOS: 21 days A FACE TO FACE EVALUATION WAS PERFORMED  Frank Fuller 07/25/2019, 9:09 AM

## 2019-07-26 ENCOUNTER — Inpatient Hospital Stay (HOSPITAL_COMMUNITY): Payer: Medicare Other | Admitting: Physical Therapy

## 2019-07-26 ENCOUNTER — Inpatient Hospital Stay (HOSPITAL_COMMUNITY): Payer: Medicare Other

## 2019-07-26 LAB — CBC
HCT: 28.2 % — ABNORMAL LOW (ref 39.0–52.0)
Hemoglobin: 9.1 g/dL — ABNORMAL LOW (ref 13.0–17.0)
MCH: 31.1 pg (ref 26.0–34.0)
MCHC: 32.3 g/dL (ref 30.0–36.0)
MCV: 96.2 fL (ref 80.0–100.0)
Platelets: 121 10*3/uL — ABNORMAL LOW (ref 150–400)
RBC: 2.93 MIL/uL — ABNORMAL LOW (ref 4.22–5.81)
RDW: 19.3 % — ABNORMAL HIGH (ref 11.5–15.5)
WBC: 4.5 10*3/uL (ref 4.0–10.5)
nRBC: 0 % (ref 0.0–0.2)

## 2019-07-26 LAB — RENAL FUNCTION PANEL
Albumin: 2.4 g/dL — ABNORMAL LOW (ref 3.5–5.0)
Anion gap: 15 (ref 5–15)
BUN: 27 mg/dL — ABNORMAL HIGH (ref 6–20)
CO2: 23 mmol/L (ref 22–32)
Calcium: 9.4 mg/dL (ref 8.9–10.3)
Chloride: 96 mmol/L — ABNORMAL LOW (ref 98–111)
Creatinine, Ser: 8.11 mg/dL — ABNORMAL HIGH (ref 0.61–1.24)
GFR calc Af Amer: 8 mL/min — ABNORMAL LOW (ref >60)
GFR calc non Af Amer: 6 mL/min — ABNORMAL LOW (ref >60)
Glucose, Bld: 110 mg/dL — ABNORMAL HIGH (ref 70–99)
Phosphorus: 4.6 mg/dL (ref 2.5–4.6)
Potassium: 4.2 mmol/L (ref 3.5–5.1)
Sodium: 134 mmol/L — ABNORMAL LOW (ref 135–145)

## 2019-07-26 LAB — VANCOMYCIN, RANDOM: Vancomycin Rm: 20

## 2019-07-26 MED ORDER — DARBEPOETIN ALFA 150 MCG/0.3ML IJ SOSY
PREFILLED_SYRINGE | INTRAMUSCULAR | Status: AC
  Filled 2019-07-26: qty 0.3

## 2019-07-26 MED ORDER — VANCOMYCIN HCL IN DEXTROSE 750-5 MG/150ML-% IV SOLN
INTRAVENOUS | Status: AC
  Administered 2019-07-26: 750 mg via INTRAVENOUS
  Filled 2019-07-26: qty 150

## 2019-07-26 NOTE — Progress Notes (Signed)
Subjective:  In rehab gym with no cos today / for HD  Today   Objective Vital signs in last 24 hours: Vitals:   07/25/19 0411 07/25/19 1548 07/25/19 2005 07/26/19 0417  BP: (!) 189/97 (!) 174/94 (!) 190/98 (!) 186/95  Pulse: 79 79 82 80  Resp: 19 14 18 18   Temp: 98.4 F (36.9 C) 98.2 F (36.8 C) 98.5 F (36.9 C) (!) 97.5 F (36.4 C)  TempSrc: Oral  Oral Oral  SpO2: 98% 93% 99% 95%  Weight:       Weight change:   Physical Exam General: alert , Well developed,  male in NAD Heart: RRR, no murmurs, rubs or gallops Lungs: Lungs CTA bilaterally without wheezing, rhonchi or rales Abdomen:  soft, NT, ND + BS Extremities: No peripheral edema. Dialysis Access: R IJ TDC    OP Dialysis Orders: [facility] 4h 53min 400/1.5 87.5kg 2/2.25 P2 R TDC Hep none - Parsabiv 2.5mg  IV q HD  - Calcitriol 1.20mcg PO q HD  Problem/Plan: 1. Spinal cord infarct/ paraperesis: per spinal MRI 8/23at T9- T11, following presentation with inability to move his legs. Neuro, NSurg and VVS consulted, all felt etiology was likely spinal artery injury related to aortic dissection procedure. All recommended supportive care and hope for recovery. Per NSurg, no indication for surgery. Continue treatment with aspirin recommended. Now admitted to CIR for ongoing physical therapy/Occupational Therapy along with psychotherapy and pain management. 2. ESRD:Continue HD on TTS schedule. HD tomorrow . 3. RecentType B aortic dissection (s/p stent graft repair 8/14):Goal SBP per VVS 120-170. Known severe resistant HTN. Continue to avoid intradialytic hypotension.  4. HTN/volume: Blood pressures remain high but overall within goal of 120-170. Now on amlodipine 10, hydralazine 100 tid, labetalol 300 bidand losartan as prior. Spironolactone 25started here. New dry wt around 83- 85kg. plan  2 l uf  5. L4-L5 lumbar discitis: s/p recent aortic stentgraft. Plans noted for intravenous vancomycin and cefepime  until 07/29/2019.  6. Hematochezia- recurrent:Flex sig 9/1 showedstigmata of recent bleedinganddiffusely ulcerated rectum.EGD with fewnon-bleeding erosions in gastric antrum. Rx'd w/ miralax, carafate < 2 weeks. Repeat Flex sigdone9/4 withhemostatic spray to entirerectum. 7. Anemia(ESRD + ABLA):Hgb stable 9.5. On Aranesp 150 q Sat. Transfuse when hemoglobin <7 8. Secondary hyperparathyroidism -Phos 6.1 > 5.8>4.7 . Binders changed to Fosrenol d/t concern that Auryxia irritating his bowels. Calcium 9.2, corrected 10.7. Not currently on calcitriol. Use low calcium bath.Parsabiv not on hospital formulary. 9. Nutrition: Significant hypoalbuminemia noted likely following acute hospitalization/surgery. Continue renal diet with ONS. 10. Recent MSSA bacteremia/AVG infection:S/p AVG removal June/ July andAncefcoursecompleted. Reports plans for new AVF after discharge. 11. Depression -following recent sequence of clinical events. On citalopram  Ernest Haber, PA-C [facility] Beeper [phone] 07/26/2019,11:27 AM  LOS: 22 days   Labs: Basic Metabolic Panel: Recent Labs  Lab 07/21/19 0548 07/22/19 1409 07/24/19 1959  NA 136 135 135  K 5.0 5.4* 4.8  CL 97* 98 98  CO2 24 21* 23  GLUCOSE 91 89 98  BUN 34* 47* 39*  CREATININE 8.27* 10.02* 9.65*  CALCIUM 9.6 9.2 9.2  PHOS 5.3* 5.6* 4.7*   Liver Function Tests: Recent Labs  Lab 07/21/19 0548 07/22/19 1409 07/24/19 1959  ALBUMIN 2.4* 2.3* 2.2*   No results for input(s): LIPASE, AMYLASE in the last 168 hours. No results for input(s): AMMONIA in the last 168 hours. CBC: Recent Labs  Lab 07/21/19 0548 07/22/19 1409 07/25/19 0621  WBC 4.9 4.7 4.0  NEUTROABS 2.7  --  2.3  HGB 9.9* 8.5* 9.5*  HCT 32.0* 27.3* 29.2*  MCV 95.5 98.2 94.8  PLT 168 162 132*   Cardiac Enzymes: No results for input(s): CKTOTAL, CKMB, CKMBINDEX, TROPONINI in the last 168 hours. CBG: No results for input(s): GLUCAP in the  last 168 hours.  Studies/Results: No results found. Medications: . ceFEPime (MAXIPIME) IV 2 g (07/25/19 0021)  . vancomycin 750 mg (07/24/19 2226)   . amLODipine  10 mg Oral QHS  . atorvastatin  20 mg Oral q1800  . Chlorhexidine Gluconate Cloth  6 each Topical Q0600  . citalopram  10 mg Oral Daily  . darbepoetin (ARANESP) injection - DIALYSIS  150 mcg Intravenous Q Sat-HD  . feeding supplement (NEPRO CARB STEADY)  237 mL Oral BID BM  . hydrALAZINE  100 mg Oral Q8H  . labetalol  300 mg Oral BID  . lanthanum  1,000 mg Oral TID WC  . lidocaine  1 application Topical Once  . losartan  100 mg Oral Daily  . multivitamin  1 tablet Oral QHS  . oxyCODONE  10 mg Oral QHS  . pantoprazole  40 mg Oral BID  . pregabalin  50 mg Oral BID  . spironolactone  25 mg Oral Daily  . witch hazel-glycerin   Topical QID

## 2019-07-26 NOTE — Progress Notes (Signed)
Physical Therapy Session Note  Patient Details  Name: Frank Fuller MRN: [id number] Date of Birth: 1961-03-22  Today's Date: 07/26/2019 PT Individual Time: 1100-1200 PT Individual Time Calculation (min): 60 min   Short Term Goals: Week 3:  PT Short Term Goal 1 (Week 3): =LTG due to ELOS  Skilled Therapeutic Interventions/Progress Updates:    Pt received seated in therapy gym from OT session, agreeable to PT. No complaints of pain. Pt transfers via squat pivot transfer at Supervision to mod I level. Manual w/c propulsion x 200 ft with use of BUE at mod I level. Sit to stand in standing frame x 4 reps, min A to stand with minimal reliance on sling. Standing 2 x 2 min with focus on decreasing BUE support and upright posture in standing before onset of fatigue. Standing mini-squats 2 x 15 reps with no sling support in standing frame, focus on decreased UE support when returning to full upright standing. Manual w/c propulsion with use of BLE 2 x 25 ft forward/backward for BLE strengthening. Seated w/c pushups x 5 reps, x 7 reps for BUE strengthening and pressure relief. Pt requests to return to bed at end of session. Squat pivot transfer w/c to bed Supervision. Pt left seated in bed with needs in reach at end of session.  Therapy Documentation Precautions:  Precautions Precautions: Fall Precaution Comments: paraplegic Restrictions Weight Bearing Restrictions: No    Therapy/Group: Individual Therapy   Excell Seltzer, PT, DPT  07/26/2019, 4:05 PM

## 2019-07-26 NOTE — Progress Notes (Signed)
Occupational Therapy Session Note  Patient Details  Name: Frank Fuller MRN: [id number] Date of Birth: 01-Apr-1961  Today's Date: 07/26/2019 OT Individual Time: 1005-1101 OT Individual Time Calculation (min): 56 min    Short Term Goals: Week 3:  OT Short Term Goal 1 (Week 3): STG=LTG due to LOS OT Short Term Goal 1 - Progress (Week 3): Progressing toward goal  Skilled Therapeutic Interventions/Progress Updates:    Pt received in w/c with no c/o pain. Pt completed 200 ft of w/c propulsion to Dayroom with mod I. Pt completed squat pivot transfer to the Nustep. Pt completed high intensity interval training with cueing for 30 sec periods of high exertion and step rate > 75 per minute, with resistance at 6-7. Pt had extended cool down periods with resistance at 3-4 with step rate down to ~30 per minute to recooperate. Pt edu on impacts of HIIT on cardiovascular health and strength. Pt very motivated by this activity and enjoying it. Pt then propelled to therapy gym where he completed squat pivot to the mat with (S). Pt transitioned into prone on mat and completed posterior chain strengthening circuit with focus on UE muscles needed to push up from w/c. Pt also completed 3x 10 push ups and back extensions. Pt transitioned back to sitting EOM where he completed elevated push ups to simulate pressure relief in his w/c. Pt was passed off to PT in the therapy gym.    Therapy Documentation Precautions:  Precautions Precautions: Fall Precaution Comments: paraplegic Restrictions Weight Bearing Restrictions: No   Therapy/Group: Individual Therapy  Curtis Sites 07/26/2019, 11:02 AM

## 2019-07-26 NOTE — Progress Notes (Signed)
[place] PHYSICAL MEDICINE & REHABILITATION PROGRESS NOTE   Subjective/Complaints:   Pt reports PT said he can't have walker- explained that's probably best since not standing/walking right now- explained Evelene Croon will provide if he needs it at d/c.  Otherwise, doing "great"- no complaints.   ROS: Patient denies fever, rash, sore throat, blurred vision, nausea, vomiting, diarrhea, cough, shortness of breath or chest pain, joint or back pain, headache, or mood change.   Objective:   No results found. Recent Labs    07/25/19 0621  WBC 4.0  HGB 9.5*  HCT 29.2*  PLT 132*   Recent Labs    07/24/19 1959  NA 135  K 4.8  CL 98  CO2 23  GLUCOSE 98  BUN 39*  CREATININE 9.65*  CALCIUM 9.2    Intake/Output Summary (Last 24 hours) at 07/26/2019 1122 Last data filed at 07/26/2019 0700 Gross per 24 hour  Intake 420 ml  Output -  Net 420 ml     Physical Exam: Vital Signs Blood pressure (!) 186/95, pulse 80, temperature (!) 97.5 F (36.4 C), temperature source Oral, resp. rate 18, weight 84.6 kg, SpO2 95 %. Constitutional: No distress . Vital signs reviewed. Awake, alert,appropriate, bright affect; lights off; watching TV, NAD HEENT: conjugate gaze;  oral membranes moist Neck: supple Cardiovascular: RRR without murmur.  Port in chest   Respiratory: CTA Bilaterally without wheezes or rales. Normal effort    GI: BS +, non-tender, non-distended  Skin: Warm and dry.  Intact. Psych: bright affect Musc: No edema in extremities.  No tenderness in extremities. Neurological: Alert Motor: Bilateral upper extremities: 5/5 proximal distal, unchanged Right lower extremity: Hip flexion, knee extension 2/5, ankle dorsiflexion 4/5, no changes Left lower extremity: Hip flexion 2-/5, knee extension 2+/5, ankle dorsiflexion 4/5, no changes  Assessment/Plan: 1. Functional deficits secondary to T10 Spinal cord infarct - T 10 ASIA C which require 3+ hours per day of interdisciplinary therapy  in a comprehensive inpatient rehab setting.  Physiatrist is providing close team supervision and 24 hour management of active medical problems listed below.  Physiatrist and rehab team continue to assess barriers to discharge/monitor patient progress toward functional and medical goals  Care Tool:  Bathing  Bathing activity did not occur: Refused Body parts bathed by patient: Right arm, Left arm, Chest, Abdomen, Front perineal area, Right upper leg, Left upper leg, Right lower leg, Left lower leg, Face, Buttocks   Body parts bathed by helper: Buttocks     Bathing assist Assist Level: Supervision/Verbal cueing(Bed level)     Upper Body Dressing/Undressing Upper body dressing   What is the patient wearing?: Pull over shirt    Upper body assist Assist Level: Set up assist    Lower Body Dressing/Undressing Lower body dressing      What is the patient wearing?: Pants, Incontinence brief     Lower body assist Assist for lower body dressing: Minimal Assistance - Patient > 75%(Bed level)     Toileting Toileting    Toileting assist Assist for toileting: Supervision/Verbal cueing     Transfers Chair/bed transfer  Transfers assist     Chair/bed transfer assist level: Independent(to /from mat)     Locomotion Ambulation   Ambulation assist   Ambulation activity did not occur: Safety/medical concerns          Walk 10 feet activity   Assist  Walk 10 feet activity did not occur: Safety/medical concerns        Walk 50 feet activity  Assist Walk 50 feet with 2 turns activity did not occur: Safety/medical concerns         Walk 150 feet activity   Assist Walk 150 feet activity did not occur: Safety/medical concerns         Walk 10 feet on uneven surface  activity   Assist Walk 10 feet on uneven surfaces activity did not occur: Safety/medical concerns         Wheelchair     Assist Will patient use wheelchair at discharge?: Yes Type  of Wheelchair: Manual    Wheelchair assist level: Independent Max wheelchair distance: 150'    Wheelchair 50 feet with 2 turns activity    Assist        Assist Level: Independent   Wheelchair 150 feet activity     Assist  Wheelchair 150 feet activity did not occur: Safety/medical concerns   Assist Level: Independent   Blood pressure (!) 186/95, pulse 80, temperature (!) 97.5 F (36.4 C), temperature source Oral, resp. rate 18, weight 84.6 kg, SpO2 95 %.  Medical Problem List and Plan: 1.  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   Continue CIR 2.  Antithrombotics:  -DVT/anticoagulation: SCDs.  LE vascular study limited, but negative for DVT.             -antiplatelet therapy: Low-dose aspirin 81 mg daily 3. Pain Management:   Hydrocodone q 6 h prn increase to every 4 hours as needed, increased to 5-10/325 every 4 hours as needed on 9/16  Added oxy CR at noc low dose, monitor for nausea              -prn tylenol             -prn robaxin for spasms  Added tucks QID and lidocaine QID  Gabapentin 100 nightly started on 9/17, increased to 200 on 9/18 with good improvement for neuropathic pain  9/23- will try lyrica- 25 mg BID- gabapentin making him sleepy- will wean Gabapentin once on higher dose of Lyrica.   9/24- pain somewhat better this AM  9/25- Lyrica really helping- pain down to 3/10- will con't for now and try to reduce Gabapentin next week. Will increase Lyrica to 50 mg BID to cover Gabapentin dose and stop Gabapentin on Monday.   9/28- d/c'd Gabapentin today- hopefully will not affect pain.  9/29- no change in pain control   10/3- will discuss with pt tomorrow stopping Oxycontin since has such good pain relief and hasn't taken Norco since 9/30- I think pain was nerve pain and controlled with Lyrica.    4. Mood: Provide emotional support. Pt somewhat  disheartened by multiple medical setbacks.  9/28- brighter affect, esp about going to [facility].               -antipsychotic agents: N/A 5. Neuropsych: This patient is capable of making decisions on his own behalf. 6. Skin/Wound Care: Routine skin checks 7. Fluids/Electrolytes/Nutrition: Routine in and outs 8. ID: Continue vancomycin as well as Maxipime through 07/29/2019 per infectious disease  Random level WNL on 9/19 9. Rectal ulcers/H. pylori.  Underwent EGD and flexible sigmoidoscopy 06/24/2019.  Follow-up per GI services.  Received Carafate enema x2 weeks as well as Flagyl x14 days initiated 06/26/2019 and tetracycline x14 days, completed course on 9/17.  -GI following and have discussed with pt re: continuing enemas  -continue to follow hgbs and if any significant bleeding  recurs he will need flex sig per Dr. Doyne Keel note  Hemoglobin 9.1 on 9/19  9/23- ordered labs for now and tomorrow- pt reports "pool of blood" from carafate enema last night.   Continue to monitor  9/24- Hb stable- wil avoid enema x 24 hours per GI-  Will con't to attempt to do bowel program  9/25- discussed need to con't bowel program- will check with nursing that they are doing- explained it takes 3-6 weeks to train gut.  9/27--continue bowel program per primary team 10.  Acute on chronic normocytic anemia.  Patient did receive 2 units packed red blood cells 06/23/2019 and 1 unit PRBC 07/01/2019 and remains on Aranesp.    See #9. 11.  End-stage renal disease.  Continue hemodialysis per renal services.             -HD after therapies to maximize rehab participation 12.  Thoracic aortic dissection.  Status post endovascular stent placement 06/06/2019.  Follow-up per Dr. Oneida Alar 13.  Neurogenic bowel/ bladder. I's and O's reviewed.   -He is essentially anuric.    -pt having regular incontinent bm's most days  -defer to primary team re: ongoing adjustment of bowel program  9/29- voided this AM for first time in 3  months- bodes well.  10/3- although pt is oliguric, still voids sometimes- will monitor 14.  Hypertension as well as history of first-degree AV block.  Norvasc 10 mg nightly, hydralazine 100 mg every 8 hours, labetalol 300 mg twice daily.  Monitor with increased mobility in therapies.   Spironolactone 25 daily started on 9/13  Losartan 100 daily started on 9/18 per Nephro recs  Remains extremely elevated on 9/20 with hypertensive crisis, however patient missed several doses of medications yesterday.  Will need to be more diligent about administering medications, particularly given #12  Need to have better blood pressure control  9/21- BP 180-190/90s still- will d/w Nephrology  9/22- will add Clonidine #2 patch- not many other choices and recommended by Nephrology. However per chart, clonidine causes dizziness- will speak with pt prior.  9/23- per pt, CANNOT tolerate Clonidine- will d/w Nephrology.  9/24- BP better in 160s/90s was 170s-190/110s  9/27--better but still borderline/poor control, on near-max/max dose of a few meds  9/28- 145/95- much better  9/29- no new notes from Nephrology- will add PA's to call  9/30- Nephrology, based on note, appears to be OK with his BP.  10/3- BP 180s-190s over 90s- BP too high- will talk with pt about Clonidine  Vitals:   07/25/19 2005 07/26/19 0417  BP: (!) 190/98 (!) 186/95  Pulse: 82 80  Resp: 18 18  Temp: 98.5 F (36.9 C) (!) 97.5 F (36.4 C)  SpO2: 99% 95%  15.  Hyperlipidemia.  Lipitor 16.  Tobacco abuse.  Counseling 17.  Thrombocytopenia  Platelets 141 on 9/19  Continue to monitor 18.  Hyperglycemia  Elevated on 9/19, continue to monitor 19. Thrush  9/21- will start Diflucan and dose in evening; and Magic Mouthwash  9/22- says doing better  10/1- resolved  20- Dispo  9/28- calling pt's PCP at [phone] Dr. Clare Charon. To ask about ramp and about Depends per pt request. Have left message for PCP to call me back- left my  number. - he has ordered ramp for pt and will order Depends- fax d/c summary, etc to [phone] -9/30- pt won't meet criteria for H/H bed/bath aide due to high level of function. He needs to learn bowel program as well,  but is focused will learn at [facility]- explained won't be going to [place] for 1.5-2 weeks after d/c. 10/1- suggested brother do family training here.   10/2- ramp to be put in early next week- maybe Monday.   LOS: 22 days A FACE TO FACE EVALUATION WAS PERFORMED  Damondre Pfeifle 07/26/2019, 11:22 AM

## 2019-07-26 NOTE — Progress Notes (Signed)
Pharmacy Antibiotic Note  Frank Fuller is a 58 y.o. male admitted on 07/04/2019.  Pharmacy has been consulted for Vanco/Cefepime dosing for vertebral osteomyelitis/discitis until 10/6 per ID recommendations. WBC is wnl. Patient remains afebrile.  ESRD - Patient remains on HD TTS, on schedule and tolerating full sessions.   8/23 BCx >> neg 8/25 IR disc aspirate >> neg 9/10 BCx - neg  Pre-HD vancomycin random is therapeutic at 20 today (goal 15-25).   Plan: -Continue vancomycin to 750mg  IV qHD TTS -Cefepime 2g IV qTTS at 1800 -Pre-HD vancomycin level weekly (Next due 10/10) -Monitor HD schedule/tolerance inpatient -LOT - 6 weeks per ID  Weight: 186 lb 8.2 oz (84.6 kg)  Temp (24hrs), Avg:98.1 F (36.7 C), Min:97.5 F (36.4 C), Max:98.5 F (36.9 C)  Recent Labs  Lab 07/21/19 0548 07/22/19 1409 07/24/19 1959 07/25/19 0621 07/26/19 0541 07/26/19 1339  WBC 4.9 4.7  --  4.0  --  4.5  CREATININE 8.27* 10.02* 9.65*  --   --   --   VANCORANDOM  --   --   --   --  20  --     Estimated Creatinine Clearance: 9.7 mL/min (A) (by C-G formula based on SCr of 9.65 mg/dL (H)).    Allergies  Allergen Reactions  . Oxycodone Nausea Only  . Pramoxine     Other reaction(s): Skin Rash  . Chlorhexidine Other (See Comments)    Unknown reaction Patch skin test done at dialysis 06/26/17  - staff using clear dressing and alcohol to clean exit site of catheter  . Clonidine Derivatives Other (See Comments)    Dizziness   . Fluorouracil     Other reaction(s): Skin Rash  . Lisinopril Other (See Comments)    unresponsive  . Sensipar  [Cinacalcet Hcl]     Other reaction(s): agitation  . Carvedilol Rash    Acey Lav, PharmD  PGY1 Acute Care Pharmacy Resident [phone] 07/26/2019 1:45 PM

## 2019-07-27 ENCOUNTER — Inpatient Hospital Stay (HOSPITAL_COMMUNITY): Payer: Medicare Other | Admitting: Occupational Therapy

## 2019-07-27 ENCOUNTER — Inpatient Hospital Stay (HOSPITAL_COMMUNITY): Payer: Medicare Other

## 2019-07-27 MED ORDER — VANCOMYCIN HCL IN DEXTROSE 750-5 MG/150ML-% IV SOLN
750.0000 mg | INTRAVENOUS | Status: AC
  Administered 2019-07-29: 11:00:00 750 mg via INTRAVENOUS
  Filled 2019-07-27: qty 150

## 2019-07-27 MED ORDER — CLONIDINE HCL 0.1 MG PO TABS
0.1000 mg | ORAL_TABLET | Freq: Two times a day (BID) | ORAL | Status: DC
  Administered 2019-07-27 – 2019-07-28 (×4): 0.1 mg via ORAL
  Filled 2019-07-27 (×5): qty 1

## 2019-07-27 MED ORDER — LABETALOL HCL 200 MG PO TABS
400.0000 mg | ORAL_TABLET | Freq: Two times a day (BID) | ORAL | Status: DC
  Administered 2019-07-27 – 2019-07-28 (×3): 400 mg via ORAL
  Filled 2019-07-27 (×4): qty 2

## 2019-07-27 NOTE — Progress Notes (Signed)
[facility] PHYSICAL MEDICINE & REHABILITATION PROGRESS NOTE   Subjective/Complaints:   Pt reports no issues- is willing to stop Oxycontin since rectal pain is basically gone.  Also, willing to try Clonidine since BP is so high.  ROS: Patient denies fever, rash, sore throat, blurred vision, nausea, vomiting, diarrhea, cough, shortness of breath or chest pain, joint or back pain, headache, or mood change.   Objective:   No results found. Recent Labs    07/25/19 0621 07/26/19 1339  WBC 4.0 4.5  HGB 9.5* 9.1*  HCT 29.2* 28.2*  PLT 132* 121*   Recent Labs    07/24/19 1959 07/26/19 1339  NA 135 134*  K 4.8 4.2  CL 98 96*  CO2 23 23  GLUCOSE 98 110*  BUN 39* 27*  CREATININE 9.65* 8.11*  CALCIUM 9.2 9.4    Intake/Output Summary (Last 24 hours) at 07/27/2019 1105 Last data filed at 07/27/2019 0700 Gross per 24 hour  Intake 160 ml  Output 2000 ml  Net -1840 ml     Physical Exam: Vital Signs Blood pressure (!) 179/96, pulse 77, temperature 99.1 F (37.3 C), temperature source Oral, resp. rate 18, weight 84 kg, SpO2 95 %. Constitutional: No distress . Vital signs reviewed. Awake, alert,appropriate, sitting up in manual w/c, on phone, NAD HEENT: conjugate gaze;  oral membranes moist Neck: supple Cardiovascular: RRR without murmur.  Port in chest   Respiratory: CTA Bilaterally without wheezes or rales. Normal effort    GI: BS +, non-tender, non-distended  Skin: Warm and dry.  Intact. Psych: bright affect Musc: No edema in extremities.  No tenderness in extremities. Neurological: Alert Motor: Bilateral upper extremities: 5/5 proximal distal, unchanged Right lower extremity: Hip flexion, knee extension 2/5, ankle dorsiflexion 4/5, no changes Left lower extremity: Hip flexion 2-/5, knee extension 2+/5, ankle dorsiflexion 4/5, no changes  Assessment/Plan: 1. Functional deficits secondary to T10 Spinal cord infarct - T 10 ASIA C which require 3+ hours per day of  interdisciplinary therapy in a comprehensive inpatient rehab setting.  Physiatrist is providing close team supervision and 24 hour management of active medical problems listed below.  Physiatrist and rehab team continue to assess barriers to discharge/monitor patient progress toward functional and medical goals  Care Tool:  Bathing  Bathing activity did not occur: Refused Body parts bathed by patient: Right arm, Left arm, Chest, Abdomen, Front perineal area, Right upper leg, Left upper leg, Right lower leg, Left lower leg, Face, Buttocks   Body parts bathed by helper: Buttocks     Bathing assist Assist Level: Supervision/Verbal cueing(Bed level)     Upper Body Dressing/Undressing Upper body dressing   What is the patient wearing?: Pull over shirt    Upper body assist Assist Level: Set up assist    Lower Body Dressing/Undressing Lower body dressing      What is the patient wearing?: Pants, Incontinence brief     Lower body assist Assist for lower body dressing: Minimal Assistance - Patient > 75%(Bed level)     Toileting Toileting    Toileting assist Assist for toileting: Supervision/Verbal cueing     Transfers Chair/bed transfer  Transfers assist     Chair/bed transfer assist level: Supervision/Verbal cueing     Locomotion Ambulation   Ambulation assist   Ambulation activity did not occur: Safety/medical concerns          Walk 10 feet activity   Assist  Walk 10 feet activity did not occur: Safety/medical concerns  Walk 50 feet activity   Assist Walk 50 feet with 2 turns activity did not occur: Safety/medical concerns         Walk 150 feet activity   Assist Walk 150 feet activity did not occur: Safety/medical concerns         Walk 10 feet on uneven surface  activity   Assist Walk 10 feet on uneven surfaces activity did not occur: Safety/medical concerns         Wheelchair     Assist Will patient use wheelchair  at discharge?: Yes Type of Wheelchair: Manual    Wheelchair assist level: Independent Max wheelchair distance: 150'    Wheelchair 50 feet with 2 turns activity    Assist        Assist Level: Independent   Wheelchair 150 feet activity     Assist  Wheelchair 150 feet activity did not occur: Safety/medical concerns   Assist Level: Independent   Blood pressure (!) 179/96, pulse 77, temperature 99.1 F (37.3 C), temperature source Oral, resp. rate 18, weight 84 kg, SpO2 95 %.  Medical Problem List and Plan: 1.  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   Continue CIR 2.  Antithrombotics:  -DVT/anticoagulation: SCDs.  LE vascular study limited, but negative for DVT.             -antiplatelet therapy: Low-dose aspirin 81 mg daily 3. Pain Management:   Hydrocodone q 6 h prn increase to every 4 hours as needed, increased to 5-10/325 every 4 hours as needed on 9/16  Added oxy CR at noc low dose, monitor for nausea              -prn tylenol             -prn robaxin for spasms  Added tucks QID and lidocaine QID  Gabapentin 100 nightly started on 9/17, increased to 200 on 9/18 with good improvement for neuropathic pain  9/23- will try lyrica- 25 mg BID- gabapentin making him sleepy- will wean Gabapentin once on higher dose of Lyrica.   9/24- pain somewhat better this AM  9/25- Lyrica really helping- pain down to 3/10- will con't for now and try to reduce Gabapentin next week. Will increase Lyrica to 50 mg BID to cover Gabapentin dose and stop Gabapentin on Monday.   9/28- d/c'd Gabapentin today- hopefully will not affect pain.  9/29- no change in pain control   10/3- will discuss with pt tomorrow stopping Oxycontin since has such good pain relief and hasn't taken Norco since 9/30- I think pain was nerve pain and controlled with Lyrica.   10/4- will stop Oxycontin - ok  with pt.    4. Mood: Provide emotional support. Pt somewhat disheartened by multiple medical setbacks.  9/28- brighter affect, esp about going to [facility].               -antipsychotic agents: N/A 5. Neuropsych: This patient is capable of making decisions on his own behalf. 6. Skin/Wound Care: Routine skin checks 7. Fluids/Electrolytes/Nutrition: Routine in and outs 8. ID: Continue vancomycin as well as Maxipime through 07/29/2019 per infectious disease  Random level WNL on 9/19 9. Rectal ulcers/H. pylori.  Underwent EGD and flexible sigmoidoscopy 06/24/2019.  Follow-up per GI services.  Received Carafate enema x2 weeks as well as Flagyl x14 days initiated 06/26/2019 and tetracycline x14 days, completed course on 9/17.  -GI following and have discussed  with pt re: continuing enemas  -continue to follow hgbs and if any significant bleeding recurs he will need flex sig per Dr. Doyne Keel note  Hemoglobin 9.1 on 9/19  9/23- ordered labs for now and tomorrow- pt reports "pool of blood" from carafate enema last night.   Continue to monitor  9/24- Hb stable- wil avoid enema x 24 hours per GI-  Will con't to attempt to do bowel program  9/25- discussed need to con't bowel program- will check with nursing that they are doing- explained it takes 3-6 weeks to train gut.  9/27--continue bowel program per primary team 10.  Acute on chronic normocytic anemia.  Patient did receive 2 units packed red blood cells 06/23/2019 and 1 unit PRBC 07/01/2019 and remains on Aranesp.    See #9. 11.  End-stage renal disease.  Continue hemodialysis per renal services.             -HD after therapies to maximize rehab participation 12.  Thoracic aortic dissection.  Status post endovascular stent placement 06/06/2019.  Follow-up per Dr. Oneida Alar 13.  Neurogenic bowel/ bladder. I's and O's reviewed.   -He is essentially anuric.    -pt having regular incontinent bm's most days  -defer to primary team re: ongoing adjustment  of bowel program  9/29- voided this AM for first time in 3 months- bodes well.  10/3- although pt is oliguric, still voids sometimes- will monitor 14.  Hypertension as well as history of first-degree AV block.  Norvasc 10 mg nightly, hydralazine 100 mg every 8 hours, labetalol 300 mg twice daily.  Monitor with increased mobility in therapies.   Spironolactone 25 daily started on 9/13  Losartan 100 daily started on 9/18 per Nephro recs  Remains extremely elevated on 9/20 with hypertensive crisis, however patient missed several doses of medications yesterday.  Will need to be more diligent about administering medications, particularly given #12  Need to have better blood pressure control  9/21- BP 180-190/90s still- will d/w Nephrology  9/22- will add Clonidine #2 patch- not many other choices and recommended by Nephrology. However per chart, clonidine causes dizziness- will speak with pt prior.  9/23- per pt, CANNOT tolerate Clonidine- will d/w Nephrology.  9/24- BP better in 160s/90s was 170s-190/110s  9/27--better but still borderline/poor control, on near-max/max dose of a few meds  9/28- 145/95- much better  9/29- no new notes from Nephrology- will add PA's to call  9/30- Nephrology, based on note, appears to be OK with his BP.  10/3- BP 180s-190s over 90s- BP too high- will talk with pt about Clonidine   10/4- will try Clonidine 0.1 mg daily Vitals:   07/27/19 0458 07/27/19 0814  BP: (!) 128/104 (!) 179/96  Pulse: 83 77  Resp: 18   Temp: 99.1 F (37.3 C)   SpO2: 95%   15.  Hyperlipidemia.  Lipitor 16.  Tobacco abuse.  Counseling 17.  Thrombocytopenia  Platelets 141 on 9/19  Continue to monitor 18.  Hyperglycemia  Elevated on 9/19, continue to monitor 19. Thrush  9/21- will start Diflucan and dose in evening; and Magic Mouthwash  9/22- says doing better  10/1- resolved  20- Dispo  9/28- calling pt's PCP at [phone] Dr. Clare Charon. To ask about ramp and about  Depends per pt request. Have left message for PCP to call me back- left my number. - he has ordered ramp for pt and will order Depends- fax d/c summary, etc to [phone] -9/30- pt won't meet  criteria for H/H bed/bath aide due to high level of function. He needs to learn bowel program as well, but is focused will learn at [facility]- explained won't be going to [place] for 1.5-2 weeks after d/c. 10/1- suggested brother do family training here.   10/2- ramp to be put in early next week- maybe Monday.   LOS: 23 days A FACE TO FACE EVALUATION WAS PERFORMED  Deshanna Kama 07/27/2019, 11:05 AM

## 2019-07-27 NOTE — Progress Notes (Addendum)
Subjective:  In gym , states tolerated HD last night ."Probale  dc Tuesday"   Objective Vital signs in last 24 hours: Vitals:   07/26/19 2033 07/26/19 2100 07/27/19 0458 07/27/19 0814  BP:  (!) 199/110 (!) 128/104 (!) 179/96  Pulse: 85  83 77  Resp: 16  18   Temp: 98.9 F (37.2 C)  99.1 F (37.3 C)   TempSrc: Oral  Oral   SpO2: 96%  95%   Weight:       Weight change:   Physical Exam General:alert ,Well developed, male in NAD Heart: RRR, no murmurs, rubs or gallops Lungs: Lungs CTA bilaterally, non labored  Abdomen:  soft, NT, ND + BS Extremities: No peripheral edema. Dialysis Access: R IJ TDC   OPDialysis Orders: [facility] 4h 71min 400/1.5 87.5kg 2/2.25 P2 R TDC Hep none - Parsabiv 2.5mg  IV q HD  - Calcitriol 1.64mcg PO q HD  Problem/Plan: 1. Spinal cord infarct/ paraperesis: per spinal MRI 8/23at T9- T11, following presentation with inability to move his legs. Neuro, NSurg and VVS consulted, all felt etiology was likely spinal artery injury related to aortic dissection procedure. All recommended supportive care and hope for recovery. Per NSurg, no indication for surgery. Continue treatment with aspirin recommended. Now admitted to CIR for ongoing physical therapy/Occupational Therapy along with psychotherapy and pain management. "possible DC Tuesday 10/06 " 2. ESRD:Continue HD on TTS schedule. HDtomorrow. 3. RecentType B aortic dissection (s/p stent graft repair 8/14):Goal SBP per VVS 120-170. Known severe resistant HTN. Continue to avoid intradialytic hypotension.  4. HTN/volume: Blood pressures remain high but overall within goal of 120-170. Now on amlodipine 10, hydralazine 100 tid, labetalol 300 bidand losartan as prior. Spironolactone 25started here. New dry wt around 83- 85kg.yesterday tolerated 2 l uf  5. L4-L5 lumbar discitis: s/p recent aortic stentgraft. Plans noted for intravenous vancomycin and cefepime until 07/29/2019.   6. Hematochezia- recurrent:Flex sig 9/1 showedstigmata of recent bleedinganddiffusely ulcerated rectum.EGD with fewnon-bleeding erosions in gastric antrum. Rx'd w/ miralax, carafate < 2 weeks. Repeat Flex sigdone9/4 withhemostatic spray to entirerectum. 7. Anemia(ESRD + ABLA):Hgb stable 9.1. On Aranesp 150 q Sat. Transfuse when hemoglobin <7 8. Secondary hyperparathyroidism -Phos 6.1 > 4.6 . Binders changed to Fosrenol d/t concern that Auryxia irritating his bowels. Calcium 9.4 corrected 10.7. Not currently on calcitriol. Use low calcium bath.Parsabiv not on hospital formulary. 9. Nutrition: Significant hypoalbuminemia noted likely following acute hospitalization/surgery. Continue renal diet with ONS. 10. Recent MSSA bacteremia/AVG infection:S/p AVG removal June/ July andAncefcoursecompleted. Reports plans for new AVF after discharge. 11. Depression -following recent sequence of clinical events. On citalopram  Ernest Haber, PA-C [facility] Beeper [phone] 07/27/2019,11:07 AM  LOS: 23 days   Pt seen, examined and agree w A/P as above.  [name]  MD 07/27/2019, 4:31 PM    Labs: Basic Metabolic Panel: Recent Labs  Lab 07/22/19 1409 07/24/19 1959 07/26/19 1339  NA 135 135 134*  K 5.4* 4.8 4.2  CL 98 98 96*  CO2 21* 23 23  GLUCOSE 89 98 110*  BUN 47* 39* 27*  CREATININE 10.02* 9.65* 8.11*  CALCIUM 9.2 9.2 9.4  PHOS 5.6* 4.7* 4.6   Liver Function Tests: Recent Labs  Lab 07/22/19 1409 07/24/19 1959 07/26/19 1339  ALBUMIN 2.3* 2.2* 2.4*   No results for input(s): LIPASE, AMYLASE in the last 168 hours. No results for input(s): AMMONIA in the last 168 hours. CBC: Recent Labs  Lab 07/21/19 0548 07/22/19 1409 07/25/19 0621 07/26/19 1339  WBC 4.9 4.7 4.0 4.5  NEUTROABS 2.7  --  2.3  --   HGB 9.9* 8.5* 9.5* 9.1*  HCT 32.0* 27.3* 29.2* 28.2*  MCV 95.5 98.2 94.8 96.2  PLT 168 162 132* 121*   Cardiac Enzymes: No results for  input(s): CKTOTAL, CKMB, CKMBINDEX, TROPONINI in the last 168 hours. CBG: No results for input(s): GLUCAP in the last 168 hours.  Studies/Results: No results found. Medications: . ceFEPime (MAXIPIME) IV 2 g (07/26/19 2339)  . [START ON 07/29/2019] vancomycin     . amLODipine  10 mg Oral QHS  . atorvastatin  20 mg Oral q1800  . Chlorhexidine Gluconate Cloth  6 each Topical Q0600  . citalopram  10 mg Oral Daily  . darbepoetin (ARANESP) injection - DIALYSIS  150 mcg Intravenous Q Sat-HD  . feeding supplement (NEPRO CARB STEADY)  237 mL Oral BID BM  . hydrALAZINE  100 mg Oral Q8H  . labetalol  300 mg Oral BID  . lanthanum  1,000 mg Oral TID WC  . lidocaine  1 application Topical Once  . losartan  100 mg Oral Daily  . multivitamin  1 tablet Oral QHS  . oxyCODONE  10 mg Oral QHS  . pantoprazole  40 mg Oral BID  . pregabalin  50 mg Oral BID  . spironolactone  25 mg Oral Daily  . witch hazel-glycerin   Topical QID   Addendum = concerning  HTN issue = noted has been missing daily doses of Labetalol,  Hydralazine past 3 days  And yesterday missed Losartan dose , Will Increase Labetalol to 400 mg  Bid  And noted Clonidine 0.1mg  po bid  By Rehab team started  Today/ Again BP Goal per Card SBP 120-170 . No excess vol on exam past 3days

## 2019-07-27 NOTE — Progress Notes (Signed)
Physical Therapy Session Note  Patient Details  Name: Michai Gonyeau MRN: [id number] Date of Birth: May 07, 1961  Today's Date: 07/27/2019 PT Individual Time: 1558-1640 PT Individual Time Calculation (min): 42 min   Short Term Goals: Week 3:  PT Short Term Goal 1 (Week 3): =LTG due to ELOS  Skilled Therapeutic Interventions/Progress Updates:    Pt supine in bed upon PT arrival, agreeable to therapy tx and denies pain. Pt transferred to sitting EOB with supervision and performed squat pivot to w/c with CGA. Pt performed w/c mobility throughout the unit this session mod I and performed all w/c parts management and setting up transfers without assist/cues. Pt performed squat pivot to the mat with CGA. Pt worked on standing balance, LE neuro re-ed and LE strengthening this session to perform the following tasks: mini squats 2 x 5 with UE support on therapist's shoulders, 2 x 5 sit<>stands from elevated mat with UE support around, static standing with single UE support on standard RW to perform horseshoe toss activity x 2 trials, sitting in hi-perch for increased LE weightbearing while performing horseshoe toss x 2 trials and sit<>stands throughout session with standard RW and min-mod assist from elevated mat. Pt transferred back to w/c with supervision and propelled back to room. Pt transferred to bed and left with needs in reach and bed alarm set.   Vitals monitored during activity- BP 171/95, HR 75 bpm  Therapy Documentation Precautions:  Precautions Precautions: Fall Precaution Comments: paraplegic Restrictions Weight Bearing Restrictions: No    Therapy/Group: Individual Therapy  Netta Corrigan, PT, DPT 07/27/2019, 12:05 PM

## 2019-07-27 NOTE — Progress Notes (Signed)
Occupational Therapy Session Note  Patient Details  Name: Frank Fuller MRN: [id number] Date of Birth: [birth year]  Today's Date: 07/27/2019 OT Individual Time: [id number] & 1258-1330 OT Individual Time Calculation (min): 60 min & 32 min   Short Term Goals: Week 3:  OT Short Term Goal 1 (Week 3): STG=LTG due to LOS OT Short Term Goal 1 - Progress (Week 3): Progressing toward goal  Skilled Therapeutic Interventions/Progress Updates:    session 1:  Patient seated in w/c and ready for therapy session.  Patient able to propel w/c to/from therapy gym.  Sit pivot transfer to/from w/c, mat table and bed with CS - he is able to set up w/c, lock brakes, remove/replace foot petals independently.  Tolerated unsupported sitting at edge of mat for 50 minutes with exercises/activities focused on core/trunk/pelvic mobility, posture/upright tolerance, UB & LB strengthening, sitting balance, functional reach and endurance.  He demonstrates good effort and tolerance for activity with improving balance and AROM noted.  He returned to bed at close of session with bed alarm set and call bell in reach.  Session 2:  He is in w/c and ready for pm session.  Self propelled w/c to and from therapy gym.  Completed LE kinetron stepping exercise 2 x 3 minutes.  UB ergometer 2 x 3 minutes.  Reviewed exercises and activities that he can complete in home environment.  Sit pivot transfer back to bed at close of session with CS.  Bed alarm set and call bell in reach.    Therapy Documentation Precautions:  Precautions Precautions: Fall Precaution Comments: paraplegic Restrictions Weight Bearing Restrictions: No General:   Vital Signs: Therapy Vitals Temp: 97.9 F (36.6 C) Pulse Rate: 79 Resp: 14 BP: (!) 170/99 Patient Position (if appropriate): Lying Oxygen Therapy SpO2: 100 % O2 Device: Room Air Pain: Pain Assessment Pain Scale: 0-10 Pain Score: 0-No pain  Therapy/Group: Individual Therapy  Carlos Levering 07/27/2019, 3:35 PM

## 2019-07-28 ENCOUNTER — Inpatient Hospital Stay (HOSPITAL_COMMUNITY): Payer: Medicare Other | Admitting: Occupational Therapy

## 2019-07-28 ENCOUNTER — Inpatient Hospital Stay (HOSPITAL_COMMUNITY): Payer: Medicare Other | Admitting: Physical Therapy

## 2019-07-28 LAB — CBC WITH DIFFERENTIAL/PLATELET
Abs Immature Granulocytes: 0.01 10*3/uL (ref 0.00–0.07)
Basophils Absolute: 0 10*3/uL (ref 0.0–0.1)
Basophils Relative: 0 %
Eosinophils Absolute: 0.2 10*3/uL (ref 0.0–0.5)
Eosinophils Relative: 4 %
HCT: 28.7 % — ABNORMAL LOW (ref 39.0–52.0)
Hemoglobin: 9.2 g/dL — ABNORMAL LOW (ref 13.0–17.0)
Immature Granulocytes: 0 %
Lymphocytes Relative: 24 %
Lymphs Abs: 1.1 10*3/uL (ref 0.7–4.0)
MCH: 30.2 pg (ref 26.0–34.0)
MCHC: 32.1 g/dL (ref 30.0–36.0)
MCV: 94.1 fL (ref 80.0–100.0)
Monocytes Absolute: 0.8 10*3/uL (ref 0.1–1.0)
Monocytes Relative: 19 %
Neutro Abs: 2.4 10*3/uL (ref 1.7–7.7)
Neutrophils Relative %: 53 %
Platelets: 119 10*3/uL — ABNORMAL LOW (ref 150–400)
RBC: 3.05 MIL/uL — ABNORMAL LOW (ref 4.22–5.81)
RDW: 19 % — ABNORMAL HIGH (ref 11.5–15.5)
WBC: 4.5 10*3/uL (ref 4.0–10.5)
nRBC: 0 % (ref 0.0–0.2)

## 2019-07-28 LAB — RENAL FUNCTION PANEL
Albumin: 2.3 g/dL — ABNORMAL LOW (ref 3.5–5.0)
Anion gap: 14 (ref 5–15)
BUN: 23 mg/dL — ABNORMAL HIGH (ref 6–20)
CO2: 23 mmol/L (ref 22–32)
Calcium: 9.4 mg/dL (ref 8.9–10.3)
Chloride: 98 mmol/L (ref 98–111)
Creatinine, Ser: 7.48 mg/dL — ABNORMAL HIGH (ref 0.61–1.24)
GFR calc Af Amer: 8 mL/min — ABNORMAL LOW (ref >60)
GFR calc non Af Amer: 7 mL/min — ABNORMAL LOW (ref >60)
Glucose, Bld: 90 mg/dL (ref 70–99)
Phosphorus: 5.2 mg/dL — ABNORMAL HIGH (ref 2.5–4.6)
Potassium: 3.8 mmol/L (ref 3.5–5.1)
Sodium: 135 mmol/L (ref 135–145)

## 2019-07-28 NOTE — Progress Notes (Signed)
Occupational Therapy Session Note  Patient Details  Name: Frank Fuller MRN: [id number] Date of Birth: [birth year]  Today's Date: 07/28/2019 OT Individual Time: 0847-1000 OT Individual Time Calculation (min): 73 min    Short Term Goals: Week 4:  OT Short Term Goal 1 (Week 4): STG=LTG due to LOS  Skilled Therapeutic Interventions/Progress Updates:    Treatment session with focus on w/c mobility in home environment, functional transfers, and BLE strengthening.  Pt received upright in w/c reporting already bathed and dressing early this morning after bowel program.  Pt reports completing all tasks at bed level without assistance.  Completed transfers bed <> w/c from 25" bed height (home height per pt report) at Mod I level, with pt able to complete setup of w/c and all parts management safely.  Engaged in trunk/core activity in supine and sitting EOB with pt improved control.  Engaged in w/c mobility in ADL apt with focus on functional transfers to w/c <> bed, w/c <> couch, bed <> drop arm BSC with distant supervision due to various heights and Mod I when transferring to level drop arm BSC.  Pt able to navigate around room and obtain items from dresser and closet with good awareness and setup of w/c positioning.    Educated on supervision goals with pt stating that his brother will be in and out but cannot stay 24/7.  Discussed safety concerns as pt voicing desire to stand with RW at home -educated on fall risk and educated on no standing without someone present, preferably therapy.    Therapy Documentation Precautions:  Precautions Precautions: Fall Precaution Comments: paraplegic Restrictions Weight Bearing Restrictions: No Pain: Pain Assessment Pain Scale: 0-10 Pain Score: 0-No pain   Therapy/Group: Individual Therapy  Regie Bunner, [place] 07/28/2019, 12:12 PM

## 2019-07-28 NOTE — Progress Notes (Signed)
Physical Therapy Discharge Summary  Patient Details  Name: Frank Fuller MRN: [id number] Date of Birth: 1961/05/15  Today's Date: 07/28/2019   Patient has met 7 of 7 long term goals due to improved activity tolerance, improved balance, improved postural control, increased strength and ability to compensate for deficits.  Patient to discharge at a wheelchair level Modified Independent.   Patient's care partner unavailable to provide the necessary assistance at discharge. Patient is at mod I level for transfers and w/c mobility however hands-on family education was offered to patient's brother and he declined to participate. Pt is safe to d/c home at this time at mod I level.  Reasons goals not met: Pt has met all rehab goals.  Recommendation:  Patient will benefit from ongoing skilled PT services in home health setting to continue to advance safe functional mobility, address ongoing impairments in balance, endurance, LE strength, independence with functional mobility, independence with bowel and bladder management, and minimize fall risk.  Equipment: 18x18 manual w/c  Reasons for discharge: treatment goals met and discharge from hospital  Patient/family agrees with progress made and goals achieved: Yes    PT Discharge Precautions/Restrictions Precautions Precautions: Fall Precaution Comments: paraplegic Restrictions Weight Bearing Restrictions: No Vision/Perception  Perception Perception: Within Functional Limits Praxis Praxis: Intact  Cognition Overall Cognitive Status: Within Functional Limits for tasks assessed Arousal/Alertness: Awake/alert Orientation Level: Oriented X4 Attention: Sustained;Focused Focused Attention: Appears intact Sustained Attention: Appears intact Memory: Appears intact Awareness: Impaired Awareness Impairment: Anticipatory impairment Problem Solving: Appears intact Safety/Judgment: Impaired Comments: At times exhibits decreased awareness of  deficits and their functional implications Sensation Sensation Light Touch: Impaired Detail Light Touch Impaired Details: Impaired RLE;Impaired LLE(improved since eval) Proprioception: Impaired by gross assessment(improved since eval) Coordination Gross Motor Movements are Fluid and Coordinated: No Fine Motor Movements are Fluid and Coordinated: Yes Coordination and Movement Description: impaired 2/2 BLE paresis Motor  Motor Motor: Paraplegia;Abnormal postural alignment and control Motor - Discharge Observations: impaired 2/2 pareplegia  Mobility Bed Mobility Bed Mobility: Rolling Right;Rolling Left;Supine to Sit;Sit to Supine Rolling Right: Independent Rolling Left: Independent Supine to Sit: Independent Sit to Supine: Independent Transfers Transfers: Lateral/Scoot Transport planner Transfers: Independent with assistive device Lateral/Scoot Transfers: Independent with assistive device Transfer (Assistive device): Other (Comment)(increased time) Locomotion  Gait Ambulation: No Gait Gait: No Stairs / Additional Locomotion Stairs: No Ramp: Independent with assistive Production manager Mobility: Yes Wheelchair Assistance: Independent with Camera operator: Both upper extremities Wheelchair Parts Management: Independent Distance: 250  Trunk/Postural Assessment  Cervical Assessment Cervical Assessment: Within Functional Limits Thoracic Assessment Thoracic Assessment: Within Functional Limits Lumbar Assessment Lumbar Assessment: Within Functional Limits Postural Control Postural Control: Deficits on evaluation Trunk Control: improved since eval Postural Limitations: decreased  Balance Balance Balance Assessed: Yes Static Sitting Balance Static Sitting - Balance Support: No upper extremity supported;Feet supported Static Sitting - Level of Assistance: 7: Independent;6: Modified independent  (Device/Increase time) Dynamic Sitting Balance Dynamic Sitting - Level of Assistance: 6: Modified independent (Device/Increase time) Extremity Assessment   RLE Assessment RLE Assessment: Exceptions to [facility] General Strength Comments: impaired, see below(improved since eval) RLE Strength Right Hip Flexion: 3+/5 Right Hip ABduction: 3/5 Right Hip ADduction: 3/5 Right Knee Flexion: 3+/5 Right Knee Extension: 3+/5 Right Ankle Dorsiflexion: 4/5 Right Ankle Plantar Flexion: 2+/5 LLE Assessment LLE Assessment: Exceptions to [facility] General Strength Comments: impaired, see below(improved since eval) LLE Strength Left Hip Flexion: 3+/5 Left Hip ABduction: 3/5 Left Hip ADduction: 3/5 Left Knee Flexion: 3+/5 Left  Knee Extension: 3+/5 Left Ankle Dorsiflexion: 4/5 Left Ankle Plantar Flexion: 2+/5     Excell Seltzer, PT, DPT 07/28/2019, 2:57 PM

## 2019-07-28 NOTE — Progress Notes (Signed)
Occupational Therapy Discharge Summary  Patient Details  Name: Mendy Lapinsky MRN: [id number] Date of Birth: 04-05-1961   Patient has met 64 of 11 long term goals due to improved activity tolerance, improved balance, postural control, ability to compensate for deficits and improved awareness.  Patient to discharge at overall Supervision level.  Patient's care partner never attended any formal family education or observed therapy sessions to provide the necessary physical and cognitive assistance at discharge.  Patient has demonstrated safety with self-care tasks of bathing and dressing at bed level with Supervision/setup and transfers to complete toileting needs at Mod I level.  Therapy continues to recommend supervision for bathing, LB dressing, and toileting to which pt reports that his brother will be around frequently but unable to provide 24/7 supervision.  Reasons goals not met: NA  Recommendation:  Patient will benefit from ongoing skilled OT services in home health setting to continue to advance functional skills in the area of BADL and Reduce care partner burden.  Equipment: pt reports has drop arm BSC from previous hospitalization  Reasons for discharge: treatment goals met and discharge from hospital  Patient/family agrees with progress made and goals achieved: Yes  OT Discharge Precautions/Restrictions  Precautions Precautions: Fall Precaution Comments: paraplegic Restrictions Weight Bearing Restrictions: No General   Vital Signs Therapy Vitals Temp: 97.8 F (36.6 C) Temp Source: Oral Pulse Rate: 65 Resp: 16 BP: (!) 157/98 Patient Position (if appropriate): Sitting Oxygen Therapy SpO2: 100 % O2 Device: Room Air Pain Pain Assessment Pain Scale: 0-10 Pain Score: 0-No pain ADL ADL Eating: Independent Where Assessed-Eating: Chair Grooming: Independent Where Assessed-Grooming: Sitting at sink, Wheelchair Upper Body Bathing: Supervision/safety, Setup Where  Assessed-Upper Body Bathing: Sitting at sink Lower Body Bathing: Supervision/safety, Setup Where Assessed-Lower Body Bathing: Bed level Upper Body Dressing: Independent Where Assessed-Upper Body Dressing: Wheelchair Lower Body Dressing: Setup Where Assessed-Lower Body Dressing: Bed level Toileting: Supervision/safety Where Assessed-Toileting: Bedside Commode Toilet Transfer: Modified independent Toilet Transfer Method: Engineer, water: Drop arm bedside Microbiologist: Close supervision Social research officer, government Method: Education officer, environmental: Radio broadcast assistant ADL Comments: not cleared for shower, incontinent of bowel requiring max A to clean up supine in bed Vision Baseline Vision/History: Wears glasses Wears Glasses: Reading only Patient Visual Report: No change from baseline Vision Assessment?: No apparent visual deficits Perception  Perception: Within Functional Limits Praxis Praxis: Intact Cognition Overall Cognitive Status: Within Functional Limits for tasks assessed Arousal/Alertness: Awake/alert Orientation Level: Oriented X4 Attention: Sustained;Focused Focused Attention: Appears intact Sustained Attention: Appears intact Memory: Appears intact Immediate Memory Recall: Sock;Blue;Bed Memory Recall Sock: Without Cue Memory Recall Blue: Without Cue Memory Recall Bed: Without Cue Awareness: Impaired Awareness Impairment: Anticipatory impairment Problem Solving: Appears intact Safety/Judgment: Impaired Comments: At times exhibits decreased awareness of deficits and their functional implications Sensation Sensation Light Touch: Impaired Detail Light Touch Impaired Details: Impaired RLE;Impaired LLE Hot/Cold: Not tested Proprioception: Impaired by gross assessment Stereognosis: Not tested Additional Comments: however improved since eval Coordination Gross Motor Movements are Fluid and Coordinated: No Fine Motor  Movements are Fluid and Coordinated: Yes Coordination and Movement Description: impaired 2/2 BLE paresis Finger Nose Finger Test: [facility] Motor  Motor Motor: Paraplegia;Abnormal postural alignment and control Motor - Discharge Observations: impaired 2/2 pareplegia Mobility  Bed Mobility Bed Mobility: Rolling Right;Rolling Left;Supine to Sit;Sit to Supine Rolling Right: Independent Rolling Left: Independent Supine to Sit: Independent Sit to Supine: Independent  Trunk/Postural Assessment  Cervical Assessment Cervical Assessment: Within Functional Limits Thoracic Assessment  Thoracic Assessment: Within Functional Limits Lumbar Assessment Lumbar Assessment: Within Functional Limits Postural Control Postural Control: Deficits on evaluation Trunk Control: improved since eval Postural Limitations: decreased  Balance Balance Balance Assessed: Yes Static Sitting Balance Static Sitting - Balance Support: No upper extremity supported;Feet supported Static Sitting - Level of Assistance: 7: Independent;6: Modified independent (Device/Increase time) Dynamic Sitting Balance Dynamic Sitting - Level of Assistance: 6: Modified independent (Device/Increase time) Extremity/Trunk Assessment RUE Assessment RUE Assessment: Within Functional Limits General Strength Comments: 4+/5 LUE Assessment LUE Assessment: Within Functional Limits General Strength Comments: 4+/5   Lawanna Cecere 07/28/2019, 3:24 PM

## 2019-07-28 NOTE — Progress Notes (Signed)
Physical Therapy Session Note  Patient Details  Name: Frank Fuller MRN: [id number] Date of Birth: 17-Jul-1961  Today's Date: 07/28/2019 PT Individual Time: 1415-1530 PT Individual Time Calculation (min): 75 min   Short Term Goals: Week 3:  PT Short Term Goal 1 (Week 3): =LTG due to ELOS  Skilled Therapeutic Interventions/Progress Updates:    Pt received seated in w/c in room, agreeable to PT session. No complaints of pain. Pt is at mod I level for w/c mobility and management of w/c parts up to 250 ft. Pt is able to perform bed mobility independently from flat bed, is mod I for squat pivot and/or lateral scoot transfers bed to/from w/c. Pt is setup A for car transfer simulating his brother's small SUV car seat height. Nustep level 3 x 10 min with use of B UE/LE with focus on increased speed x 30 dec, decreased speed x 1 min for interval training. Pt requires several seated rest breaks on Nustep. Pt demonstrates overall improved BLE strength, postural control, and independence and safety with functional transfers. Squat pivot transfer w/c to bed mod I. Sit to stand x 3 reps to SW with min A, R knee blocked, focus on decreased UE support in standing. Pt fatigues quickly with standing activity. Education with patient that he is not to attempt standing by himself at home, pt in agreement. Pt left seated in bed with needs in reach at end of session.  Therapy Documentation Precautions:  Precautions Precautions: Fall Precaution Comments: paraplegic Restrictions Weight Bearing Restrictions: No    Therapy/Group: Individual Therapy   Excell Seltzer, PT, DPT  07/28/2019, 3:48 PM

## 2019-07-28 NOTE — Progress Notes (Signed)
[facility] Progress Note   Subjective: Up in wheelchair. No C/Os. Excited about going home tomorrow.   Objective Vitals:   07/27/19 0814 07/27/19 1338 07/27/19 1947 07/28/19 0559  BP: (!) 179/96 (!) 170/99 (!) 179/93 (!) 179/101  Pulse: 77 79 74 74  Resp:  14 17 18   Temp:  97.9 F (36.6 C) 98.2 F (36.8 C) 98.1 F (36.7 C)  TempSrc:   Oral Oral  SpO2:  100% (!) 89% 96%  Weight:       Physical Exam General: Thin older male in NAD Heart: S1.S2 RRR Lungs: CTAB A/P Abdomen: S, NT  Extremities: No LE edema Dialysis Access: RIJ TDC drsg CDI.    Additional Objective Labs: Basic Metabolic Panel: Recent Labs  Lab 07/24/19 1959 07/26/19 1339 07/28/19 0545  NA 135 134* 135  K 4.8 4.2 3.8  CL 98 96* 98  CO2 23 23 23   GLUCOSE 98 110* 90  BUN 39* 27* 23*  CREATININE 9.65* 8.11* 7.48*  CALCIUM 9.2 9.4 9.4  PHOS 4.7* 4.6 5.2*   Liver Function Tests: Recent Labs  Lab 07/24/19 1959 07/26/19 1339 07/28/19 0545  ALBUMIN 2.2* 2.4* 2.3*   No results for input(s): LIPASE, AMYLASE in the last 168 hours. CBC: Recent Labs  Lab 07/22/19 1409 07/25/19 0621 07/26/19 1339 07/28/19 0545  WBC 4.7 4.0 4.5 4.5  NEUTROABS  --  2.3  --  2.4  HGB 8.5* 9.5* 9.1* 9.2*  HCT 27.3* 29.2* 28.2* 28.7*  MCV 98.2 94.8 96.2 94.1  PLT 162 132* 121* 119*   Blood Culture    Component Value Date/Time   SDES BLOOD LEFT HAND 07/03/2019 1644   SPECREQUEST  07/03/2019 1644    BOTTLES DRAWN AEROBIC ONLY Blood Culture adequate volume   CULT  07/03/2019 1644    NO GROWTH 5 DAYS Performed at [facility], [place] [address], [place], [place] [address]    REPTSTATUS 07/08/2019 FINAL 07/03/2019 1644    Cardiac Enzymes: No results for input(s): CKTOTAL, CKMB, CKMBINDEX, TROPONINI in the last 168 hours. CBG: No results for input(s): GLUCAP in the last 168 hours. Iron Studies: No results for input(s): IRON, TIBC, TRANSFERRIN, FERRITIN in the last 72  hours. @lablastinr3 @ Studies/Results: No results found. Medications: . ceFEPime (MAXIPIME) IV 2 g (07/26/19 2339)  . [START ON 07/29/2019] vancomycin     . amLODipine  10 mg Oral QHS  . atorvastatin  20 mg Oral q1800  . Chlorhexidine Gluconate Cloth  6 each Topical Q0600  . citalopram  10 mg Oral Daily  . cloNIDine  0.1 mg Oral BID  . darbepoetin (ARANESP) injection - DIALYSIS  150 mcg Intravenous Q Sat-HD  . feeding supplement (NEPRO CARB STEADY)  237 mL Oral BID BM  . hydrALAZINE  100 mg Oral Q8H  . labetalol  400 mg Oral BID  . lanthanum  1,000 mg Oral TID WC  . lidocaine  1 application Topical Once  . losartan  100 mg Oral Daily  . multivitamin  1 tablet Oral QHS  . pantoprazole  40 mg Oral BID  . pregabalin  50 mg Oral BID  . spironolactone  25 mg Oral Daily  . witch hazel-glycerin   Topical QID     OPDialysis Orders: [facility] 4h 59min 400/1.5 87.5kg 2/2.25 P2 R TDC Hep none - Parsabiv 2.5mg  IV q HD  - Calcitriol 1.20mcg PO q HD  Problem/Plan: 1. Spinal cord infarct/ paraperesis: per spinal MRI 8/23at T9- T11, following presentation  with inability to move his legs. Neuro, NSurg and VVS consulted, all felt etiology was likely spinal artery injury related to aortic dissection procedure. All recommended supportive care and hope for recovery. Per Neuro Surg, no indication for surgery. Continue treatment with aspirin recommended. Now admitted to CIR for ongoing physical therapy/Occupational Therapy along with psychotherapy and pain management. "possible DC Tuesday 10/06 " 2. ESRD:Continue HD on TTS schedule. Next HD 07/29/19 K+ 3.8-Use 3.0 K bath with HD.  3. RecentType B aortic dissection (s/p stent graft repair 8/14):Goal SBP per VVS 120-170. Known severe resistant HTN. Continue to avoid intradialytic hypotension.  4. HTN/volume: Blood pressures remain high but overall within goal of 120-170. Now on amlodipine 10, hydralazine 100 tid, labetalol 300  bidand losartan as prior. Spironolactone 25started here. New dry wt around 83- 85kg. 5. L4-L5 lumbar discitis: s/p recent aortic stentgraft. Plans noted for intravenous vancomycin and cefepime until 07/29/2019.  6. Hematochezia- recurrent:Flex sig 9/1 showedstigmata of recent bleedinganddiffusely ulcerated rectum.EGD with fewnon-bleeding erosions in gastric antrum. Rx'd w/ miralax, carafate < 2 weeks. Repeat Flex sigdone9/4 withhemostatic spray to entirerectum. 7. Anemia(ESRD + ABLA):Hgb stable 9.1. On Aranesp 150 q Sat. Transfuse when hemoglobin <7 8. Secondary hyperparathyroidism -Phos 6.1 > 4.6 . Binders changed to Fosrenol d/t concern that Auryxia irritating his bowels. Calcium 9.4 corrected 10.7. Not currently on calcitriol. Use low calcium bath.Parsabiv not on hospital formulary. 9. Nutrition: Significant hypoalbuminemia noted likely following acute hospitalization/surgery. Continue renal diet with ONS. 10. Recent MSSA bacteremia/AVG infection:S/p AVG removal June/ July andAncefcoursecompleted. Reports plans for new AVF after discharge. 11. Depression -following recent sequence of clinical events. On citalopram  Terryl Molinelli H. Josealberto Montalto NP-C 07/28/2019, 12:11 PM  [employer] [phone]

## 2019-07-28 NOTE — Plan of Care (Signed)
  Problem: RH BOWEL ELIMINATION Goal: RH STG MANAGE BOWEL WITH ASSISTANCE Description: STG Manage Bowel with Assistance. mod Outcome: Progressing   Problem: RH SKIN INTEGRITY Goal: RH STG SKIN FREE OF INFECTION/BREAKDOWN Description: Remain free of breakdown with mod assist Outcome: Progressing Goal: RH STG MAINTAIN SKIN INTEGRITY WITH ASSISTANCE Description: STG Maintain Skin Integrity With Assistance. Mod Outcome: Progressing   Problem: RH SAFETY Goal: RH STG ADHERE TO SAFETY PRECAUTIONS W/ASSISTANCE/DEVICE Description: STG Adhere to Safety Precautions With Assistance/Device. mod Outcome: Progressing Goal: RH STG DECREASED RISK OF FALL WITH ASSISTANCE Description: STG Decreased Risk of Fall With Assistance. Adhere to safety precautions, will have no falls w/ mod assist Outcome: Progressing   Problem: RH PAIN MANAGEMENT Goal: RH STG PAIN MANAGED AT OR BELOW PT'S PAIN GOAL Description: Less than 3 Outcome: Progressing   Problem: RH KNOWLEDGE DEFICIT GENERAL Goal: RH STG INCREASE KNOWLEDGE OF SELF CARE AFTER HOSPITALIZATION Description: Patient will be able to describe self care at home following hospitalization with cues/reminders Outcome: Progressing

## 2019-07-28 NOTE — Progress Notes (Signed)
[facility] PHYSICAL MEDICINE & REHABILITATION PROGRESS NOTE   Subjective/Complaints: Patient seen sitting up in his chair working with therapy this morning.  He states he slept well overnight.  He is in good spirits.  He is questions regarding dialysis at [place] in [place].  ROS: Denies CP, SOB, N/V/D  Objective:   No results found. Recent Labs    07/26/19 1339 07/28/19 0545  WBC 4.5 4.5  HGB 9.1* 9.2*  HCT 28.2* 28.7*  PLT 121* 119*   Recent Labs    07/26/19 1339 07/28/19 0545  NA 134* 135  K 4.2 3.8  CL 96* 98  CO2 23 23  GLUCOSE 110* 90  BUN 27* 23*  CREATININE 8.11* 7.48*  CALCIUM 9.4 9.4    Intake/Output Summary (Last 24 hours) at 07/28/2019 1032 Last data filed at 07/28/2019 0753 Gross per 24 hour  Intake 480 ml  Output -  Net 480 ml     Physical Exam: Vital Signs Blood pressure (!) 179/101, pulse 74, temperature 98.1 F (36.7 C), temperature source Oral, resp. rate 18, weight 84 kg, SpO2 96 %. Constitutional: No distress . Vital signs reviewed. HENT: Normocephalic.  Atraumatic. Eyes: EOMI. No discharge. Cardiovascular: No JVD. Respiratory: Normal effort.  No stridor. GI: Non-distended. Skin: Warm and dry.  Intact. Psych: Normal mood.  Normal behavior. Musc: No edema in extremities.  No tenderness in extremities. Neurological: Alert Motor: Bilateral upper extremities: 5/5 proximal distal, unchanged Right lower extremity: Hip flexion 3/5, knee extension 4/5, ankle dorsiflexion 4/5 Left lower extremity: Hip flexion 3-/5, knee extension 4-/5, ankle dorsiflexion 4/5  Assessment/Plan: 1. 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  Physiatrist is providing close team supervision and 24 hour management of active medical problems listed below.  Physiatrist and rehab team continue to assess barriers to discharge/monitor patient  progress toward functional and medical goals  Care Tool:  Bathing  Bathing activity did not occur: Refused Body parts bathed by patient: Right arm, Left arm, Chest, Abdomen, Front perineal area, Right upper leg, Left upper leg, Right lower leg, Left lower leg, Face, Buttocks   Body parts bathed by helper: Buttocks     Bathing assist Assist Level: Supervision/Verbal cueing(Bed level)     Upper Body Dressing/Undressing Upper body dressing   What is the patient wearing?: Pull over shirt    Upper body assist Assist Level: Set up assist    Lower Body Dressing/Undressing Lower body dressing      What is the patient wearing?: Pants, Incontinence brief     Lower body assist Assist for lower body dressing: Minimal Assistance - Patient > 75%(Bed level)     Toileting Toileting    Toileting assist Assist for toileting: Supervision/Verbal cueing     Transfers Chair/bed transfer  Transfers assist     Chair/bed transfer assist level: Contact Guard/Touching assist     Locomotion Ambulation   Ambulation assist   Ambulation activity did not occur: Safety/medical concerns          Walk 10 feet activity   Assist  Walk 10 feet activity did not occur: Safety/medical concerns        Walk 50 feet activity   Assist Walk 50 feet with 2 turns activity did not occur: Safety/medical concerns         Walk 150 feet activity   Assist Walk 150 feet activity did not occur:  Safety/medical concerns         Walk 10 feet on uneven surface  activity   Assist Walk 10 feet on uneven surfaces activity did not occur: Safety/medical concerns         Wheelchair     Assist Will patient use wheelchair at discharge?: Yes Type of Wheelchair: Manual    Wheelchair assist level: Independent Max wheelchair distance: 150'    Wheelchair 50 feet with 2 turns activity    Assist        Assist Level: Independent   Wheelchair 150 feet activity      Assist  Wheelchair 150 feet activity did not occur: Safety/medical concerns   Assist Level: Independent   Blood pressure (!) 179/101, pulse 74, temperature 98.1 F (36.7 C), temperature source Oral, resp. rate 18, weight 84 kg, SpO2 96 %.  Medical Problem List and Plan: 1.  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   Continue CIR 2.  Antithrombotics:  -DVT/anticoagulation: SCDs.  LE vascular study limited, but negative for DVT.             -antiplatelet therapy: Low-dose aspirin 81 mg daily 3. Pain Management:   Hydrocodone as needed  Continue Lyrica 50 twice daily  Controlled on 10/5 4. Mood: Provide emotional support. Pt somewhat disheartened by multiple medical setbacks.              -antipsychotic agents: N/A 5. Neuropsych: This patient is capable of making decisions on his own behalf. 6. Skin/Wound Care: Routine skin checks 7. Fluids/Electrolytes/Nutrition: Routine in and outs 8. ID: Continue vancomycin as well as Maxipime through 07/29/2019 per infectious disease  Random level WNL on 10/3 9. Rectal ulcers/H. pylori.  Underwent EGD and flexible sigmoidoscopy 06/24/2019.  Follow-up per GI services.  Received Carafate enema x2 weeks as well as Flagyl x14 days initiated 06/26/2019 and tetracycline x14 days, completed course on 9/17.  -GI following and have discussed with pt re: continuing enemas  -continue to follow hgbs and if any significant bleeding recurs he will need flex sig per Dr. Doyne Keel note  Continue bowel program 10.  Acute on chronic normocytic anemia.  Patient did receive 2 units packed red blood cells 06/23/2019 and 1 unit PRBC 07/01/2019 and remains on Aranesp.    See #9.  Hemoglobin 9.2 on 10/5  Continue to monitor 11.  End-stage renal disease.  Continue hemodialysis per renal services.             -HD after therapies to maximize rehab  participation 12.  Thoracic aortic dissection.  Status post endovascular stent placement 06/06/2019.  Follow-up per Dr. Oneida Alar 13.  Neurogenic bowel/ bladder. I's and O's reviewed.   Continue programs 14.  Hypertension as well as history of first-degree AV block.  Norvasc 10 mg nightly, hydralazine 100 mg every 8 hours, labetalol 300 mg twice daily.  Monitor with increased mobility in therapies.   Spironolactone 25 daily started on 9/13  Losartan 100 daily started on 9/18 per Nephro recs  Trial clonidine 0.1 mg daily  Blood pressure remains extremely elevated with most recent recording 179/101 Vitals:   07/27/19 1947 07/28/19 0559  BP: (!) 179/93 (!) 179/101  Pulse: 74 74  Resp: 17 18  Temp: 98.2 F (36.8 C) 98.1 F (36.7 C)  SpO2: (!) 89% 96%  15.  Hyperlipidemia.  Lipitor 16.  Tobacco abuse.  Counseling 17.  Thrombocytopenia  Platelets 119 on 10/5  Seems  to be fluctuating, will need outpatient follow-up  Continue to monitor 18.  Hyperglycemia: Resolved 19. Thrush  9/21- will start Diflucan and dose in evening; and Magic Mouthwash  9/22- says doing better  10/1- resolved   LOS: 24 days A FACE TO FACE EVALUATION WAS PERFORMED  Chosen Garron Lorie Phenix 07/28/2019, 10:32 AM

## 2019-07-29 ENCOUNTER — Inpatient Hospital Stay (HOSPITAL_COMMUNITY): Payer: Medicare Other | Admitting: Occupational Therapy

## 2019-07-29 ENCOUNTER — Inpatient Hospital Stay (HOSPITAL_COMMUNITY): Payer: Medicare Other | Admitting: Physical Therapy

## 2019-07-29 LAB — RENAL FUNCTION PANEL
Albumin: 2.2 g/dL — ABNORMAL LOW (ref 3.5–5.0)
Anion gap: 14 (ref 5–15)
BUN: 34 mg/dL — ABNORMAL HIGH (ref 6–20)
CO2: 22 mmol/L (ref 22–32)
Calcium: 9 mg/dL (ref 8.9–10.3)
Chloride: 98 mmol/L (ref 98–111)
Creatinine, Ser: 9.61 mg/dL — ABNORMAL HIGH (ref 0.61–1.24)
GFR calc Af Amer: 6 mL/min — ABNORMAL LOW (ref >60)
GFR calc non Af Amer: 5 mL/min — ABNORMAL LOW (ref >60)
Glucose, Bld: 96 mg/dL (ref 70–99)
Phosphorus: 5.6 mg/dL — ABNORMAL HIGH (ref 2.5–4.6)
Potassium: 3.9 mmol/L (ref 3.5–5.1)
Sodium: 134 mmol/L — ABNORMAL LOW (ref 135–145)

## 2019-07-29 LAB — CBC
HCT: 28.1 % — ABNORMAL LOW (ref 39.0–52.0)
Hemoglobin: 8.6 g/dL — ABNORMAL LOW (ref 13.0–17.0)
MCH: 29.9 pg (ref 26.0–34.0)
MCHC: 30.6 g/dL (ref 30.0–36.0)
MCV: 97.6 fL (ref 80.0–100.0)
Platelets: 123 10*3/uL — ABNORMAL LOW (ref 150–400)
RBC: 2.88 MIL/uL — ABNORMAL LOW (ref 4.22–5.81)
RDW: 19.2 % — ABNORMAL HIGH (ref 11.5–15.5)
WBC: 4.5 10*3/uL (ref 4.0–10.5)
nRBC: 0 % (ref 0.0–0.2)

## 2019-07-29 MED ORDER — WITCH HAZEL-GLYCERIN EX PADS: MEDICATED_PAD | Freq: Four times a day (QID) | CUTANEOUS | 12 refills | Status: DC

## 2019-07-29 MED ORDER — ASPIRIN 81 MG PO TBEC: 81.0000 mg | DELAYED_RELEASE_TABLET | Freq: Every day | ORAL | 0 refills | Status: AC

## 2019-07-29 MED ORDER — HYDRALAZINE HCL 100 MG PO TABS: 100.0000 mg | ORAL_TABLET | Freq: Three times a day (TID) | ORAL | 0 refills | Status: AC

## 2019-07-29 MED ORDER — POLYETHYLENE GLYCOL 3350 17 G PO PACK: 17.0000 g | PACK | Freq: Every day | ORAL | 0 refills | Status: AC

## 2019-07-29 MED ORDER — LOSARTAN POTASSIUM 100 MG PO TABS: 100.0000 mg | ORAL_TABLET | Freq: Every day | ORAL | 0 refills | Status: AC

## 2019-07-29 MED ORDER — HEPARIN SODIUM (PORCINE) 1000 UNIT/ML DIALYSIS: 1000.0000 [IU] | INTRAMUSCULAR | Status: DC | PRN

## 2019-07-29 MED ORDER — SPIRONOLACTONE 25 MG PO TABS: 25.0000 mg | ORAL_TABLET | Freq: Every day | ORAL | 0 refills | Status: AC

## 2019-07-29 MED ORDER — SODIUM CHLORIDE 0.9 % IV SOLN: 100.0000 mL | INTRAVENOUS | Status: DC | PRN

## 2019-07-29 MED ORDER — PENTAFLUOROPROP-TETRAFLUOROETH EX AERO: 1.0000 "application " | INHALATION_SPRAY | CUTANEOUS | Status: DC | PRN

## 2019-07-29 MED ORDER — LANTHANUM CARBONATE 1000 MG PO CHEW: 1000.0000 mg | CHEWABLE_TABLET | Freq: Three times a day (TID) | ORAL | 0 refills | Status: AC

## 2019-07-29 MED ORDER — LIDOCAINE-PRILOCAINE 2.5-2.5 % EX CREA: 1.0000 "application " | TOPICAL_CREAM | CUTANEOUS | Status: DC | PRN

## 2019-07-29 MED ORDER — HEPARIN SODIUM (PORCINE) 1000 UNIT/ML IJ SOLN
INTRAMUSCULAR | Status: AC
  Filled 2019-07-29: qty 4

## 2019-07-29 MED ORDER — NITROGLYCERIN 0.4 MG SL SUBL: 0.4000 mg | SUBLINGUAL_TABLET | SUBLINGUAL | 12 refills | Status: AC | PRN

## 2019-07-29 MED ORDER — VANCOMYCIN HCL IN DEXTROSE 750-5 MG/150ML-% IV SOLN
INTRAVENOUS | Status: AC
  Administered 2019-07-29: 750 mg via INTRAVENOUS
  Filled 2019-07-29: qty 150

## 2019-07-29 MED ORDER — HYDROCODONE-ACETAMINOPHEN 7.5-325 MG PO TABS: 1.0000 | ORAL_TABLET | ORAL | 0 refills | Status: AC | PRN

## 2019-07-29 MED ORDER — ACETAMINOPHEN 325 MG PO TABS: 650.0000 mg | ORAL_TABLET | Freq: Four times a day (QID) | ORAL | Status: AC | PRN

## 2019-07-29 MED ORDER — RENA-VITE PO TABS: 1.0000 | ORAL_TABLET | Freq: Every day | ORAL | 0 refills | Status: AC

## 2019-07-29 MED ORDER — LABETALOL HCL 200 MG PO TABS: 400.0000 mg | ORAL_TABLET | Freq: Two times a day (BID) | ORAL | 0 refills | Status: AC

## 2019-07-29 MED ORDER — AMLODIPINE BESYLATE 10 MG PO TABS: 10.0000 mg | ORAL_TABLET | Freq: Every day | ORAL | 0 refills | Status: AC

## 2019-07-29 MED ORDER — LIDOCAINE HCL (PF) 1 % IJ SOLN: 5.0000 mL | INTRAMUSCULAR | Status: DC | PRN

## 2019-07-29 MED ORDER — ALTEPLASE 2 MG IJ SOLR: 2.0000 mg | Freq: Once | INTRAMUSCULAR | Status: DC | PRN

## 2019-07-29 MED ORDER — PREGABALIN 50 MG PO CAPS: 50.0000 mg | ORAL_CAPSULE | Freq: Two times a day (BID) | ORAL | 0 refills | Status: AC

## 2019-07-29 MED ORDER — PANTOPRAZOLE SODIUM 40 MG PO TBEC: 40.0000 mg | DELAYED_RELEASE_TABLET | Freq: Two times a day (BID) | ORAL | 0 refills | Status: AC

## 2019-07-29 MED ORDER — CLONIDINE HCL 0.1 MG PO TABS: 0.1000 mg | ORAL_TABLET | Freq: Two times a day (BID) | ORAL | 11 refills | Status: AC

## 2019-07-29 MED ORDER — ATORVASTATIN CALCIUM 20 MG PO TABS: 20.0000 mg | ORAL_TABLET | Freq: Every day | ORAL | 0 refills | Status: AC

## 2019-07-29 MED ORDER — CITALOPRAM HYDROBROMIDE 10 MG PO TABS: 10.0000 mg | ORAL_TABLET | Freq: Every day | ORAL | 0 refills | Status: AC

## 2019-07-29 NOTE — Plan of Care (Signed)
  Problem: Consults Goal: RH GENERAL PATIENT EDUCATION Description: See Patient Education module for education specifics. Outcome: Completed/Met   Problem: RH BOWEL ELIMINATION Goal: RH STG MANAGE BOWEL WITH ASSISTANCE Description: STG Manage Bowel with Assistance. mod Outcome: Completed/Met   Problem: RH SKIN INTEGRITY Goal: RH STG SKIN FREE OF INFECTION/BREAKDOWN Description: Remain free of breakdown with mod assist Outcome: Completed/Met Goal: RH STG MAINTAIN SKIN INTEGRITY WITH ASSISTANCE Description: STG Maintain Skin Integrity With Assistance. Mod Outcome: Completed/Met   Problem: RH SAFETY Goal: RH STG ADHERE TO SAFETY PRECAUTIONS W/ASSISTANCE/DEVICE Description: STG Adhere to Safety Precautions With Assistance/Device. mod Outcome: Completed/Met Goal: RH STG DECREASED RISK OF FALL WITH ASSISTANCE Description: STG Decreased Risk of Fall With Assistance. Adhere to safety precautions, will have no falls w/ mod assist Outcome: Completed/Met   Problem: RH PAIN MANAGEMENT Goal: RH STG PAIN MANAGED AT OR BELOW PT'S PAIN GOAL Description: Less than 3 Outcome: Completed/Met   Problem: RH KNOWLEDGE DEFICIT GENERAL Goal: RH STG INCREASE KNOWLEDGE OF SELF CARE AFTER HOSPITALIZATION Description: Patient will be able to describe self care at home following hospitalization with cues/reminders Outcome: Completed/Met

## 2019-07-29 NOTE — Procedures (Signed)
Patient seen and examined on Hemodialysis. BP (!) 169/83   Pulse 70   Temp 98.7 F (37.1 C) (Oral)   Resp 16   Wt 87.2 kg   SpO2 100%   BMI 23.40 kg/m   QB 400 mL/ min via TDC, UF goal 3L  Tolerating treatment without complaints at this time.   Madelon Lips MD [facility] pgr [phone] 8:43 AM

## 2019-07-29 NOTE — Progress Notes (Signed)
Patient was discharged home and taken off the floor via wheelchair by NT Terri. Patient was A&O X 4 with no complaints of pain and in no apparent distress.

## 2019-07-29 NOTE — Progress Notes (Signed)
Social Work Discharge Note   The overall goal for the admission was met for:   Discharge location: Yes - pt returning to his home with intermittent support of brother  Length of Stay: Yes - 25 days  Discharge activity level: Yes - mod ind wheelchair mobility  Home/community participation: Yes  Services provided included: MD, RD, PT, OT, RN, Pharmacy, Neuropsych and SW  Financial Services: Medicare and Medicaid  Follow-up services arranged: Home Health: RN, PT, OT via [facility], DME: wheelchair, cushion via [employer] and Patient/Family has no preference for HH/DME agencies  Comments (or additional information):   contact info:  Pt @ [phone]     [place], Andrus @ [phone]  At time of d/c, pt aware that he remains on the list for admission to the SCI program at [facility] with estimated wait ~ 2 weeks.  Patient/Family verbalized understanding of follow-up arrangements: Yes  Individual responsible for coordination of the follow-up plan: pt  Confirmed correct DME delivered: Tanaya Dunigan 07/29/2019    Lannette Avellino

## 2019-07-29 NOTE — Discharge Instructions (Signed)
Inpatient Rehab Discharge Instructions  Frank Fuller Discharge date and time: No discharge date for patient encounter.   Activities/Precautions/ Functional Status: Activity: activity as tolerated Diet: renal diet Wound Care: Routine skin checks Functional status:  ___ No restrictions     ___ Walk up steps independently ___ 24/7 supervision/assistance   ___ Walk up steps with assistance ___ Intermittent supervision/assistance  ___ Bathe/dress independently ___ Walk with walker     _x__ Bathe/dress with assistance ___ Walk Independently    ___ Shower independently ___ Walk with assistance    ___ Shower with assistance ___ No alcohol     ___ Return to work/school ________    COMMUNITY REFERRALS UPON DISCHARGE:    Home Health:   PT     OT     RN                    Agency:  [facility]   Phone: [phone]   Medical Equipment/Items Ordered:  Wheelchair, cushion                                                      Agency/Supplier:  [facility]  @ [phone]    Special Instructions: Continue hemodialysis as directed  Follow up DR Hayden [phone]   My questions have been answered and I understand these instructions. I will adhere to these goals and the provided educational materials after my discharge from the hospital.  Patient/Caregiver Signature _______________________________ Date __________  Clinician Signature _______________________________________ Date __________  Please bring this form and your medication list with you to all your follow-up doctor's appointments.

## 2019-07-29 NOTE — Progress Notes (Signed)
[facility] PHYSICAL MEDICINE & REHABILITATION PROGRESS NOTE   Subjective/Complaints:   Pt reports doing great- family has lowered bed, made bathroom door wider, and ramp is in- ready for d/c today- wanted to verify [facility] had HD- agreed that is does- no issues there.   ROS: Denies CP, SOB, N/V/D  Objective:   No results found. Recent Labs    07/28/19 0545 07/29/19 0831  WBC 4.5 4.5  HGB 9.2* 8.6*  HCT 28.7* 28.1*  PLT 119* 123*   Recent Labs    07/28/19 0545 07/29/19 0831  NA 135 134*  K 3.8 3.9  CL 98 98  CO2 23 22  GLUCOSE 90 96  BUN 23* 34*  CREATININE 7.48* 9.61*  CALCIUM 9.4 9.0    Intake/Output Summary (Last 24 hours) at 07/29/2019 1323 Last data filed at 07/29/2019 1142 Gross per 24 hour  Intake 622 ml  Output 2500 ml  Net -1878 ml     Physical Exam: Vital Signs Blood pressure (!) 186/92, pulse 71, temperature 99 F (37.2 C), temperature source Oral, resp. rate 16, weight 84.4 kg, SpO2 98 %. Constitutional: No distress . Vital signs reviewed. Sitting up in manual w/c, in room, bright affect HENT: Normocephalic.  Atraumatic. Eyes: EOMI. No discharge. Cardiovascular: No JVD. Respiratory: Normal effort.  No stridor. GI: Non-distended. Skin: Warm and dry.  Intact. Psych: Normal mood.  Normal behavior. Musc: No edema in extremities.  No tenderness in extremities. Neurological: Alert Motor: Bilateral upper extremities: 5/5 proximal distal, unchanged Right lower extremity: Hip flexion 3/5, knee extension 4/5, ankle dorsiflexion 4/5 Left lower extremity: Hip flexion 3-/5, knee extension 4-/5, ankle dorsiflexion 4/5  Assessment/Plan: 1. 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  Physiatrist is providing close team supervision and 24 hour management of active medical problems listed below.  Physiatrist and rehab team continue  to assess barriers to discharge/monitor patient progress toward functional and medical goals  Care Tool:  Bathing  Bathing activity did not occur: Refused Body parts bathed by patient: Right arm, Left arm, Chest, Abdomen, Front perineal area, Right upper leg, Left upper leg, Right lower leg, Left lower leg, Face, Buttocks   Body parts bathed by helper: Buttocks     Bathing assist Assist Level: Supervision/Verbal cueing     Upper Body Dressing/Undressing Upper body dressing   What is the patient wearing?: Pull over shirt    Upper body assist Assist Level: Independent    Lower Body Dressing/Undressing Lower body dressing      What is the patient wearing?: Pants, Incontinence brief     Lower body assist Assist for lower body dressing: Set up assist     Toileting Toileting    Toileting assist Assist for toileting: Supervision/Verbal cueing     Transfers Chair/bed transfer  Transfers assist     Chair/bed transfer assist level: Independent with assistive device Chair/bed transfer assistive device: Armrests   Locomotion Ambulation   Ambulation assist   Ambulation activity did not occur: Safety/medical concerns          Walk 10 feet activity   Assist  Walk 10 feet activity did not occur: Safety/medical concerns        Walk 50 feet activity   Assist Walk 50 feet with 2 turns activity did not occur: Safety/medical concerns         Walk 150 feet activity  Assist Walk 150 feet activity did not occur: Safety/medical concerns         Walk 10 feet on uneven surface  activity   Assist Walk 10 feet on uneven surfaces activity did not occur: Safety/medical concerns         Wheelchair     Assist Will patient use wheelchair at discharge?: Yes Type of Wheelchair: Manual    Wheelchair assist level: Independent Max wheelchair distance: 250'    Wheelchair 50 feet with 2 turns activity    Assist        Assist Level:  Independent   Wheelchair 150 feet activity     Assist  Wheelchair 150 feet activity did not occur: Safety/medical concerns   Assist Level: Independent   Blood pressure (!) 186/92, pulse 71, temperature 99 F (37.2 C), temperature source Oral, resp. rate 16, weight 84.4 kg, SpO2 98 %.  Medical Problem List and Plan: 1.  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   Continue CIR 2.  Antithrombotics:  -DVT/anticoagulation: SCDs.  LE vascular study limited, but negative for DVT.             -antiplatelet therapy: Low-dose aspirin 81 mg daily 3. Pain Management:   Hydrocodone as needed  Continue Lyrica 50 twice daily  Controlled on 10/6- at d/c. 4. Mood: Provide emotional support. Pt somewhat disheartened by multiple medical setbacks.              -antipsychotic agents: N/A 5. Neuropsych: This patient is capable of making decisions on his own behalf. 6. Skin/Wound Care: Routine skin checks 7. Fluids/Electrolytes/Nutrition: Routine in and outs 8. ID: Continue vancomycin as well as Maxipime through 07/29/2019 per infectious disease  Random level WNL on 10/3 9. Rectal ulcers/H. pylori.  Underwent EGD and flexible sigmoidoscopy 06/24/2019.  Follow-up per GI services.  Received Carafate enema x2 weeks as well as Flagyl x14 days initiated 06/26/2019 and tetracycline x14 days, completed course on 9/17.  -GI following and have discussed with pt re: continuing enemas  -continue to follow hgbs and if any significant bleeding recurs he will need flex sig per Dr. Doyne Keel note  Continue bowel program 10.  Acute on chronic normocytic anemia.  Patient did receive 2 units packed red blood cells 06/23/2019 and 1 unit PRBC 07/01/2019 and remains on Aranesp.    See #9.  Hemoglobin 9.2 on 10/5  Continue to monitor 11.  End-stage renal disease.  Continue hemodialysis per renal services.              -HD after therapies to maximize rehab participation 12.  Thoracic aortic dissection.  Status post endovascular stent placement 06/06/2019.  Follow-up per Dr. Oneida Alar 13.  Neurogenic bowel/ bladder. I's and O's reviewed.   Continue programs 14.  Hypertension as well as history of first-degree AV block.  Norvasc 10 mg nightly, hydralazine 100 mg every 8 hours, labetalol 300 mg twice daily.  Monitor with increased mobility in therapies.   Spironolactone 25 daily started on 9/13  Losartan 100 daily started on 9/18 per Nephro recs  Trial clonidine 0.1 mg daily  Blood pressure remains extremely elevated with most recent recording 179/101  10/6- needs increased doses, but pt had a lot of dizziness with clonidine in past- reticent to increase.  Vitals:   07/29/19 1130 07/29/19 1142  BP: (!) 146/86 (!) 186/92  Pulse: 86 71  Resp:  16  Temp:  99 F (37.2 C)  SpO2:  98%  15.  Hyperlipidemia.  Lipitor 16.  Tobacco abuse.  Counseling 17.  Thrombocytopenia  Platelets 119 on 10/5  Seems to be fluctuating, will need outpatient follow-up  Continue to monitor 18.  Hyperglycemia: Resolved 19. Thrush  9/21- will start Diflucan and dose in evening; and Magic Mouthwash  9/22- says doing better  10/1- resolved   LOS: 25 days A FACE TO FACE EVALUATION WAS PERFORMED  Fabian Walder 07/29/2019, 1:23 PM

## 2019-07-29 NOTE — Progress Notes (Signed)
[facility] Progress Note   Subjective: Seen in dialysis.  For d/c today.  CM checking into [facility] availability of dialysis.    Objective Vitals:   07/29/19 0722 07/29/19 0730 07/29/19 0800 07/29/19 0830  BP: (!) 163/90 (!) 172/94 (!) 165/83 (!) 169/83  Pulse: 70 69 72 70  Resp:      Temp:      TempSrc:      SpO2:      Weight:       Physical Exam General: Thin in NAD Heart: S1.S2 RRR Lungs: CTAB A/P Abdomen: S, NT  Extremities: No LE edema Dialysis Access: RIJ TDC drsg CDI.    Additional Objective Labs: Basic Metabolic Panel: Recent Labs  Lab 07/24/19 1959 07/26/19 1339 07/28/19 0545  NA 135 134* 135  K 4.8 4.2 3.8  CL 98 96* 98  CO2 23 23 23   GLUCOSE 98 110* 90  BUN 39* 27* 23*  CREATININE 9.65* 8.11* 7.48*  CALCIUM 9.2 9.4 9.4  PHOS 4.7* 4.6 5.2*   Liver Function Tests: Recent Labs  Lab 07/24/19 1959 07/26/19 1339 07/28/19 0545  ALBUMIN 2.2* 2.4* 2.3*   No results for input(s): LIPASE, AMYLASE in the last 168 hours. CBC: Recent Labs  Lab 07/22/19 1409 07/25/19 0621 07/26/19 1339 07/28/19 0545  WBC 4.7 4.0 4.5 4.5  NEUTROABS  --  2.3  --  2.4  HGB 8.5* 9.5* 9.1* 9.2*  HCT 27.3* 29.2* 28.2* 28.7*  MCV 98.2 94.8 96.2 94.1  PLT 162 132* 121* 119*   Blood Culture    Component Value Date/Time   SDES BLOOD LEFT HAND 07/03/2019 1644   SPECREQUEST  07/03/2019 1644    BOTTLES DRAWN AEROBIC ONLY Blood Culture adequate volume   CULT  07/03/2019 1644    NO GROWTH 5 DAYS Performed at [facility], [place] [address], [place], [place] [address]    REPTSTATUS 07/08/2019 FINAL 07/03/2019 1644    Cardiac Enzymes: No results for input(s): CKTOTAL, CKMB, CKMBINDEX, TROPONINI in the last 168 hours. CBG: No results for input(s): GLUCAP in the last 168 hours. Iron Studies: No results for input(s): IRON, TIBC, TRANSFERRIN, FERRITIN in the last 72 hours. @lablastinr3 @ Studies/Results: No results found. Medications: . sodium  chloride    . sodium chloride    . ceFEPime (MAXIPIME) IV 2 g (07/26/19 2339)  . vancomycin     . amLODipine  10 mg Oral QHS  . atorvastatin  20 mg Oral q1800  . Chlorhexidine Gluconate Cloth  6 each Topical Q0600  . citalopram  10 mg Oral Daily  . cloNIDine  0.1 mg Oral BID  . darbepoetin (ARANESP) injection - DIALYSIS  150 mcg Intravenous Q Sat-HD  . feeding supplement (NEPRO CARB STEADY)  237 mL Oral BID BM  . hydrALAZINE  100 mg Oral Q8H  . labetalol  400 mg Oral BID  . lanthanum  1,000 mg Oral TID WC  . lidocaine  1 application Topical Once  . losartan  100 mg Oral Daily  . multivitamin  1 tablet Oral QHS  . pantoprazole  40 mg Oral BID  . pregabalin  50 mg Oral BID  . spironolactone  25 mg Oral Daily  . witch hazel-glycerin   Topical QID     OPDialysis Orders: [facility] 4h 75min 400/1.5 87.5kg 2/2.25 P2 R TDC Hep none - Parsabiv 2.5mg  IV q HD  - Calcitriol 1.46mcg PO q HD  Problem/Plan: 1. Spinal cord infarct/ paraperesis: per spinal MRI 8/23at  T9- T11, following presentation with inability to move his legs. Neuro, NSurg and VVS consulted, all felt etiology was likely spinal artery injury related to aortic dissection procedure. All recommended supportive care and hope for recovery. Per Neuro Surg, no indication for surgery. Continue treatment with aspirin recommended. Now admitted to CIR for ongoing physical therapy/Occupational Therapy along with psychotherapy and pain management. For d/c today 2. ESRD:Continue HD on TTS schedule. Next HD 07/29/19 K+ 3.8-Use 3.0 K bath with HD. Note that he is on aldactone and losartan--> would do weekly K as OP to make sure K doesn't swing the other way when he resumes his normal diet. 3. RecentType B aortic dissection (s/p stent graft repair 8/14):Goal SBP per VVS 120-170. Known severe resistant HTN. Continue to avoid intradialytic hypotension.  4. HTN/volume: Blood pressures remain high but overall within goal of  120-170. Now on amlodipine 10, hydralazine 100 tid, labetalol 300 bidand losartan as prior. Spironolactone 25started here. New dry wt around 83- 85kg. 5. L4-L5 lumbar discitis: s/p recent aortic stentgraft. Plans noted for intravenous vancomycin and cefepime until 07/29/2019.  6. Hematochezia- recurrent:Flex sig 9/1 showedstigmata of recent bleedinganddiffusely ulcerated rectum.EGD with fewnon-bleeding erosions in gastric antrum. Rx'd w/ miralax, carafate < 2 weeks. Repeat Flex sigdone9/4 withhemostatic spray to entirerectum. 7. Anemia(ESRD + ABLA):Hgb stable 9.1. On Aranesp 150 q Sat. Transfuse when hemoglobin <7 8. Secondary hyperparathyroidism -Phos 6.1 > 4.6 . Binders changed to Fosrenol d/t concern that Auryxia irritating his bowels. Calcium 9.4 corrected 10.7. Not currently on calcitriol. Use low calcium bath.Parsabiv not on hospital formulary. 9. Nutrition: Significant hypoalbuminemia noted likely following acute hospitalization/surgery. Continue renal diet with ONS. 10. Recent MSSA bacteremia/AVG infection:S/p AVG removal June/ July andAncefcoursecompleted. Reports plans for new AVF after discharge. 11. Depression -following recent sequence of clinical events. On citalopram  Madelon Lips MD 07/29/2019, 8:38 AM  [facility] [phone]

## 2019-07-31 ENCOUNTER — Telehealth: Payer: Self-pay

## 2019-07-31 NOTE — Telephone Encounter (Signed)
  [place]  Patient Name: Frank Fuller DOB: 06-26-1961 Appointment Date and Time: 08-11-2019 / 1:40pm With: Dr. Dagoberto Ligas  Questions for our staff to ask patients on Transitional care 48 hour phone call:   1. Are you/is patient experiencing any problems since coming home? NO  Are there any questions regarding any aspect of care? NO  2. Are there any questions regarding medications administration/dosing? NO  Are meds being taken as prescribed? YES  3. Have there been any falls? NO  4. Has Home Health been to the house and/or have they contacted you? YES  If not, have you tried to contact them? NA  Can we help you contact them? NA  5. Are bowels and bladder emptying properly? YES  Are there any unexpected incontinence issues?NA   If applicable, is patient following bowel/bladder programs? NA  6. Any fevers, problems with breathing, unexpected pain? NO  7. Are there any skin problems or new areas of breakdown? NO  8. Has the patient/family member arranged specialty MD follow up (ie cardiology/neurology/renal/surgical/etc)?   Can we help arrange? YES  9. Does the patient need any other services or support that we can help arrange? NO  10. Are caregivers following through as expected in assisting the patient? YES  11. Has the patient quit smoking, drinking alcohol, or using drugs as recommended? NA  [facility] [address] [phone]

## 2019-08-11 ENCOUNTER — Encounter: Payer: Medicare Other | Admitting: Physical Medicine and Rehabilitation

## 2019-08-14 ENCOUNTER — Ambulatory Visit: Payer: Medicare Other | Admitting: Vascular Surgery

## 2019-08-21 ENCOUNTER — Telehealth: Payer: Self-pay | Admitting: Vascular Surgery

## 2019-08-21 NOTE — Progress Notes (Signed)
   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   This diagnosis is expected to be lifelong and unlikely to change.

## 2019-08-21 NOTE — Telephone Encounter (Signed)
Called pt 3x to schedule CT. Will send letter.

## 2019-08-26 ENCOUNTER — Encounter: Payer: Self-pay | Admitting: Vascular Surgery

## 2019-09-11 ENCOUNTER — Ambulatory Visit: Payer: Medicare Other | Admitting: Vascular Surgery

## 2019-09-26 ENCOUNTER — Other Ambulatory Visit: Payer: Self-pay

## 2019-09-26 ENCOUNTER — Inpatient Hospital Stay (HOSPITAL_COMMUNITY): Payer: Medicare Other

## 2019-09-26 ENCOUNTER — Inpatient Hospital Stay (HOSPITAL_COMMUNITY)
Admission: EM | Admit: 2019-09-26 | Discharge: 2019-10-24 | DRG: 270 | Disposition: E | Payer: Medicare Other | Attending: Vascular Surgery | Admitting: Vascular Surgery

## 2019-09-26 ENCOUNTER — Encounter (HOSPITAL_COMMUNITY): Payer: Self-pay | Admitting: Student

## 2019-09-26 DIAGNOSIS — R040 Epistaxis: Secondary | ICD-10-CM | POA: Diagnosis present

## 2019-09-26 DIAGNOSIS — D62 Acute posthemorrhagic anemia: Secondary | ICD-10-CM | POA: Diagnosis present

## 2019-09-26 DIAGNOSIS — T82838A Hemorrhage of vascular prosthetic devices, implants and grafts, initial encounter: Secondary | ICD-10-CM | POA: Diagnosis present

## 2019-09-26 DIAGNOSIS — N186 End stage renal disease: Secondary | ICD-10-CM | POA: Diagnosis present

## 2019-09-26 DIAGNOSIS — I2699 Other pulmonary embolism without acute cor pulmonale: Secondary | ICD-10-CM | POA: Diagnosis not present

## 2019-09-26 DIAGNOSIS — I469 Cardiac arrest, cause unspecified: Secondary | ICD-10-CM | POA: Diagnosis not present

## 2019-09-26 DIAGNOSIS — Z79899 Other long term (current) drug therapy: Secondary | ICD-10-CM | POA: Diagnosis not present

## 2019-09-26 DIAGNOSIS — I711 Thoracic aortic aneurysm, ruptured, unspecified: Secondary | ICD-10-CM | POA: Diagnosis present

## 2019-09-26 DIAGNOSIS — T82310A Breakdown (mechanical) of aortic (bifurcation) graft (replacement), initial encounter: Secondary | ICD-10-CM | POA: Diagnosis present

## 2019-09-26 DIAGNOSIS — J9601 Acute respiratory failure with hypoxia: Secondary | ICD-10-CM | POA: Diagnosis not present

## 2019-09-26 DIAGNOSIS — I120 Hypertensive chronic kidney disease with stage 5 chronic kidney disease or end stage renal disease: Secondary | ICD-10-CM | POA: Diagnosis present

## 2019-09-26 DIAGNOSIS — N2581 Secondary hyperparathyroidism of renal origin: Secondary | ICD-10-CM | POA: Diagnosis present

## 2019-09-26 DIAGNOSIS — I959 Hypotension, unspecified: Secondary | ICD-10-CM | POA: Diagnosis not present

## 2019-09-26 DIAGNOSIS — I719 Aortic aneurysm of unspecified site, without rupture: Secondary | ICD-10-CM

## 2019-09-26 DIAGNOSIS — Z8673 Personal history of transient ischemic attack (TIA), and cerebral infarction without residual deficits: Secondary | ICD-10-CM

## 2019-09-26 DIAGNOSIS — Y712 Prosthetic and other implants, materials and accessory cardiovascular devices associated with adverse incidents: Secondary | ICD-10-CM | POA: Diagnosis present

## 2019-09-26 DIAGNOSIS — Z20828 Contact with and (suspected) exposure to other viral communicable diseases: Secondary | ICD-10-CM | POA: Diagnosis present

## 2019-09-26 DIAGNOSIS — D631 Anemia in chronic kidney disease: Secondary | ICD-10-CM | POA: Diagnosis present

## 2019-09-26 DIAGNOSIS — F17210 Nicotine dependence, cigarettes, uncomplicated: Secondary | ICD-10-CM | POA: Diagnosis present

## 2019-09-26 DIAGNOSIS — Z6820 Body mass index (BMI) 20.0-20.9, adult: Secondary | ICD-10-CM

## 2019-09-26 DIAGNOSIS — R509 Fever, unspecified: Secondary | ICD-10-CM

## 2019-09-26 DIAGNOSIS — Z992 Dependence on renal dialysis: Secondary | ICD-10-CM

## 2019-09-26 DIAGNOSIS — E43 Unspecified severe protein-calorie malnutrition: Secondary | ICD-10-CM | POA: Diagnosis present

## 2019-09-26 DIAGNOSIS — R042 Hemoptysis: Secondary | ICD-10-CM | POA: Diagnosis not present

## 2019-09-26 DIAGNOSIS — G8220 Paraplegia, unspecified: Secondary | ICD-10-CM | POA: Diagnosis present

## 2019-09-26 DIAGNOSIS — I712 Thoracic aortic aneurysm, without rupture: Secondary | ICD-10-CM | POA: Diagnosis not present

## 2019-09-26 LAB — CBC WITH DIFFERENTIAL/PLATELET
Abs Immature Granulocytes: 0.13 10*3/uL — ABNORMAL HIGH (ref 0.00–0.07)
Basophils Absolute: 0 10*3/uL (ref 0.0–0.1)
Basophils Relative: 0 %
Eosinophils Absolute: 0 10*3/uL (ref 0.0–0.5)
Eosinophils Relative: 0 %
HCT: 24.3 % — ABNORMAL LOW (ref 39.0–52.0)
Hemoglobin: 7.6 g/dL — ABNORMAL LOW (ref 13.0–17.0)
Immature Granulocytes: 1 %
Lymphocytes Relative: 5 %
Lymphs Abs: 0.8 10*3/uL (ref 0.7–4.0)
MCH: 26.2 pg (ref 26.0–34.0)
MCHC: 31.3 g/dL (ref 30.0–36.0)
MCV: 83.8 fL (ref 80.0–100.0)
Monocytes Absolute: 1.4 10*3/uL — ABNORMAL HIGH (ref 0.1–1.0)
Monocytes Relative: 9 %
Neutro Abs: 13.3 10*3/uL — ABNORMAL HIGH (ref 1.7–7.7)
Neutrophils Relative %: 85 %
Platelets: 323 10*3/uL (ref 150–400)
RBC: 2.9 MIL/uL — ABNORMAL LOW (ref 4.22–5.81)
RDW: 17.8 % — ABNORMAL HIGH (ref 11.5–15.5)
WBC: 15.7 10*3/uL — ABNORMAL HIGH (ref 4.0–10.5)
nRBC: 0 % (ref 0.0–0.2)

## 2019-09-26 LAB — COMPREHENSIVE METABOLIC PANEL
ALT: 11 U/L (ref 0–44)
AST: 19 U/L (ref 15–41)
Albumin: 1.9 g/dL — ABNORMAL LOW (ref 3.5–5.0)
Alkaline Phosphatase: 101 U/L (ref 38–126)
Anion gap: 15 (ref 5–15)
BUN: 28 mg/dL — ABNORMAL HIGH (ref 6–20)
CO2: 25 mmol/L (ref 22–32)
Calcium: 9.6 mg/dL (ref 8.9–10.3)
Chloride: 92 mmol/L — ABNORMAL LOW (ref 98–111)
Creatinine, Ser: 6.24 mg/dL — ABNORMAL HIGH (ref 0.61–1.24)
GFR calc Af Amer: 10 mL/min — ABNORMAL LOW (ref >60)
GFR calc non Af Amer: 9 mL/min — ABNORMAL LOW (ref >60)
Glucose, Bld: 107 mg/dL — ABNORMAL HIGH (ref 70–99)
Potassium: 4.3 mmol/L (ref 3.5–5.1)
Sodium: 132 mmol/L — ABNORMAL LOW (ref 135–145)
Total Bilirubin: 0.8 mg/dL (ref 0.3–1.2)
Total Protein: 6.5 g/dL (ref 6.5–8.1)

## 2019-09-26 LAB — LACTIC ACID, PLASMA: Lactic Acid, Venous: 1.4 mmol/L (ref 0.5–1.9)

## 2019-09-26 LAB — PROTIME-INR
INR: 1.4 — ABNORMAL HIGH (ref 0.8–1.2)
Prothrombin Time: 17.2 seconds — ABNORMAL HIGH (ref 11.4–15.2)

## 2019-09-26 LAB — PREPARE RBC (CROSSMATCH)

## 2019-09-26 LAB — SARS CORONAVIRUS 2 BY RT PCR (HOSPITAL ORDER, PERFORMED IN ~~LOC~~ HOSPITAL LAB): SARS Coronavirus 2: NEGATIVE

## 2019-09-26 LAB — TROPONIN I (HIGH SENSITIVITY): Troponin I (High Sensitivity): 39 ng/L — ABNORMAL HIGH (ref <18)

## 2019-09-26 LAB — BRAIN NATRIURETIC PEPTIDE: B Natriuretic Peptide: 831.7 pg/mL — ABNORMAL HIGH (ref 0.0–100.0)

## 2019-09-26 MED ORDER — HEPARIN SODIUM (PORCINE) 5000 UNIT/ML IJ SOLN
5000.0000 [IU] | Freq: Three times a day (TID) | INTRAMUSCULAR | Status: DC
  Filled 2019-09-26: qty 1

## 2019-09-26 MED ORDER — SODIUM CHLORIDE 0.9% FLUSH
3.0000 mL | Freq: Two times a day (BID) | INTRAVENOUS | Status: DC
  Administered 2019-09-27 – 2019-09-30 (×6): 3 mL via INTRAVENOUS

## 2019-09-26 MED ORDER — METOPROLOL TARTRATE 5 MG/5ML IV SOLN: 2.0000 mg | INTRAVENOUS | Status: DC | PRN

## 2019-09-26 MED ORDER — CITALOPRAM HYDROBROMIDE 20 MG PO TABS
10.0000 mg | ORAL_TABLET | Freq: Every day | ORAL | Status: DC
  Administered 2019-09-28 – 2019-09-29 (×2): 10 mg via ORAL
  Filled 2019-09-26 (×3): qty 1

## 2019-09-26 MED ORDER — PHENOL 1.4 % MT LIQD
1.0000 | OROMUCOSAL | Status: DC | PRN
  Filled 2019-09-26: qty 177

## 2019-09-26 MED ORDER — LABETALOL HCL 200 MG PO TABS: 400.0000 mg | ORAL_TABLET | Freq: Two times a day (BID) | ORAL | Status: DC

## 2019-09-26 MED ORDER — PANTOPRAZOLE SODIUM 40 MG PO TBEC: 40.0000 mg | DELAYED_RELEASE_TABLET | Freq: Two times a day (BID) | ORAL | Status: DC

## 2019-09-26 MED ORDER — POTASSIUM CHLORIDE CRYS ER 20 MEQ PO TBCR
20.0000 meq | EXTENDED_RELEASE_TABLET | Freq: Once | ORAL | Status: AC
  Administered 2019-09-26: 40 meq via ORAL
  Filled 2019-09-26: qty 2

## 2019-09-26 MED ORDER — HYDROCODONE-ACETAMINOPHEN 7.5-325 MG PO TABS
1.0000 | ORAL_TABLET | ORAL | Status: DC | PRN
  Administered 2019-09-27 – 2019-09-29 (×6): 1 via ORAL
  Filled 2019-09-26 (×6): qty 1

## 2019-09-26 MED ORDER — LABETALOL HCL 5 MG/ML IV SOLN: 10.0000 mg | INTRAVENOUS | Status: DC | PRN

## 2019-09-26 MED ORDER — PANTOPRAZOLE SODIUM 40 MG PO TBEC: 40.0000 mg | DELAYED_RELEASE_TABLET | Freq: Every day | ORAL | Status: DC

## 2019-09-26 MED ORDER — IOHEXOL 350 MG/ML SOLN
100.0000 mL | Freq: Once | INTRAVENOUS | Status: AC | PRN
  Administered 2019-09-26: 100 mL via INTRAVENOUS

## 2019-09-26 MED ORDER — ACETAMINOPHEN 325 MG PO TABS: 650.0000 mg | ORAL_TABLET | Freq: Four times a day (QID) | ORAL | Status: DC | PRN

## 2019-09-26 MED ORDER — AMLODIPINE BESYLATE 10 MG PO TABS
10.0000 mg | ORAL_TABLET | Freq: Every day | ORAL | Status: DC
  Administered 2019-09-28 – 2019-09-29 (×2): 10 mg via ORAL
  Filled 2019-09-26 (×2): qty 1
  Filled 2019-09-26: qty 2
  Filled 2019-09-26: qty 1

## 2019-09-26 MED ORDER — MORPHINE SULFATE (PF) 2 MG/ML IV SOLN
2.0000 mg | INTRAVENOUS | Status: DC | PRN
  Administered 2019-09-26: 5 mg via INTRAVENOUS
  Filled 2019-09-26: qty 3

## 2019-09-26 MED ORDER — SPIRONOLACTONE 25 MG PO TABS
25.0000 mg | ORAL_TABLET | Freq: Every day | ORAL | Status: DC
  Administered 2019-09-28 – 2019-09-29 (×2): 25 mg via ORAL
  Filled 2019-09-26 (×2): qty 1

## 2019-09-26 MED ORDER — HEPARIN SODIUM (PORCINE) 5000 UNIT/ML IJ SOLN
5000.0000 [IU] | Freq: Three times a day (TID) | INTRAMUSCULAR | Status: DC
  Administered 2019-09-27 – 2019-09-30 (×9): 5000 [IU] via SUBCUTANEOUS
  Filled 2019-09-26 (×9): qty 1

## 2019-09-26 MED ORDER — ONDANSETRON HCL 4 MG/2ML IJ SOLN: 4.0000 mg | Freq: Four times a day (QID) | INTRAMUSCULAR | Status: DC | PRN

## 2019-09-26 MED ORDER — NITROGLYCERIN 0.4 MG SL SUBL: 0.4000 mg | SUBLINGUAL_TABLET | SUBLINGUAL | Status: DC | PRN

## 2019-09-26 MED ORDER — LABETALOL HCL 5 MG/ML IV SOLN
0.5000 mg/min | INTRAVENOUS | Status: DC
  Administered 2019-09-26: 2 mg/min via INTRAVENOUS
  Filled 2019-09-26 (×4): qty 100

## 2019-09-26 MED ORDER — HYDRALAZINE HCL 20 MG/ML IJ SOLN: 5.0000 mg | INTRAMUSCULAR | Status: DC | PRN

## 2019-09-26 MED ORDER — GUAIFENESIN-DM 100-10 MG/5ML PO SYRP: 15.0000 mL | ORAL_SOLUTION | ORAL | Status: DC | PRN

## 2019-09-26 MED ORDER — PREGABALIN 50 MG PO CAPS
50.0000 mg | ORAL_CAPSULE | Freq: Two times a day (BID) | ORAL | Status: DC
  Administered 2019-09-26 – 2019-09-29 (×6): 50 mg via ORAL
  Filled 2019-09-26 (×3): qty 1
  Filled 2019-09-26: qty 2
  Filled 2019-09-26 (×2): qty 1

## 2019-09-26 MED ORDER — ALUM & MAG HYDROXIDE-SIMETH 200-200-20 MG/5ML PO SUSP: 15.0000 mL | ORAL | Status: DC | PRN

## 2019-09-26 MED ORDER — WITCH HAZEL-GLYCERIN EX PADS
MEDICATED_PAD | Freq: Three times a day (TID) | CUTANEOUS | Status: DC
  Administered 2019-09-27 – 2019-09-29 (×2): via TOPICAL
  Filled 2019-09-26: qty 100

## 2019-09-26 MED ORDER — ESMOLOL HCL-SODIUM CHLORIDE 2000 MG/100ML IV SOLN
25.0000 ug/kg/min | INTRAVENOUS | Status: DC
  Administered 2019-09-26: 150 ug/kg/min via INTRAVENOUS
  Filled 2019-09-26 (×2): qty 100

## 2019-09-26 MED ORDER — CEFAZOLIN SODIUM-DEXTROSE 2-4 GM/100ML-% IV SOLN
2.0000 g | INTRAVENOUS | Status: DC
  Filled 2019-09-26: qty 100

## 2019-09-26 MED ORDER — SODIUM CHLORIDE 0.9% IV SOLUTION: Freq: Once | INTRAVENOUS | Status: DC

## 2019-09-26 MED ORDER — LOSARTAN POTASSIUM 50 MG PO TABS
100.0000 mg | ORAL_TABLET | Freq: Every day | ORAL | Status: DC
  Administered 2019-09-28 – 2019-09-29 (×2): 100 mg via ORAL
  Filled 2019-09-26 (×2): qty 2

## 2019-09-26 MED ORDER — CLONIDINE HCL 0.1 MG PO TABS
0.1000 mg | ORAL_TABLET | Freq: Two times a day (BID) | ORAL | Status: DC
  Administered 2019-09-26 – 2019-09-29 (×5): 0.1 mg via ORAL
  Filled 2019-09-26 (×6): qty 1

## 2019-09-26 MED ORDER — RENA-VITE PO TABS
1.0000 | ORAL_TABLET | Freq: Every day | ORAL | Status: DC
  Administered 2019-09-27 – 2019-09-29 (×3): 1 via ORAL
  Filled 2019-09-26 (×3): qty 1

## 2019-09-26 MED ORDER — HYDRALAZINE HCL 25 MG PO TABS
100.0000 mg | ORAL_TABLET | Freq: Three times a day (TID) | ORAL | Status: DC
  Administered 2019-09-28 – 2019-09-29 (×6): 100 mg via ORAL
  Filled 2019-09-26 (×6): qty 4

## 2019-09-26 MED ORDER — SODIUM CHLORIDE 0.9 % IV SOLN: 250.0000 mL | INTRAVENOUS | Status: DC | PRN

## 2019-09-26 MED ORDER — ATORVASTATIN CALCIUM 10 MG PO TABS
20.0000 mg | ORAL_TABLET | Freq: Every day | ORAL | Status: DC
  Administered 2019-09-27 – 2019-09-29 (×3): 20 mg via ORAL
  Filled 2019-09-26 (×3): qty 2

## 2019-09-26 MED ORDER — SODIUM CHLORIDE 0.9% FLUSH: 3.0000 mL | INTRAVENOUS | Status: DC | PRN

## 2019-09-26 NOTE — Progress Notes (Signed)
VASCULAR SURGERY:  The patient had a CT angiogram of the neck and a CT dissection study.  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  Given that he had significant chest pain and back pain and hemoptysis I have recommended that we try to address this by an additional proximal stent.  Currently his pain is resolved and I think it would be safe to do this first thing in the morning.  His vertebrobasilar system is intact.  However if it we do have to cover the left subclavian artery he will also require carotid subclavian bypass.  I have discussed the indications for the procedure and the potential complications with the patient.  He understands this is a life-threatening problem.  All of his questions were answered and he is agreeable to proceed.  [name], MD Office: [phone]
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
## 2019-09-26 NOTE — ED Triage Notes (Signed)
31  The pt arrived by [facility] from [facility] ed  The pt had heart surgery  August 6th 2020  He has had some chest pain for the past few days getting worse tonight  hre had l;abs and scans at [facility]  He has a leaking valve   Dialysis pt that is due tomorrow  Dialysis catheter in his rt upper chest  Fistula lt ar.  Iv lt wrist he hasbrevifloc running for bp of 200/> there    On arrival bp 127/80

## 2019-09-26 NOTE — ED Notes (Signed)
Patient mother calling asking for an update on patient  Please call Natasha Moix [phone]

## 2019-09-26 NOTE — ED Notes (Signed)
The pt went to c-t and returned  He reports that his pain is better

## 2019-09-26 NOTE — ED Notes (Signed)
Rep;ort given to rn on 2h 

## 2019-09-27 ENCOUNTER — Encounter (HOSPITAL_COMMUNITY): Payer: Self-pay | Admitting: Anesthesiology

## 2019-09-27 ENCOUNTER — Encounter (HOSPITAL_COMMUNITY): Admission: EM | Disposition: E | Payer: Self-pay | Source: Home / Self Care | Attending: Vascular Surgery

## 2019-09-27 ENCOUNTER — Inpatient Hospital Stay (HOSPITAL_COMMUNITY): Payer: Medicare Other | Admitting: Anesthesiology

## 2019-09-27 ENCOUNTER — Inpatient Hospital Stay (HOSPITAL_COMMUNITY): Payer: Medicare Other

## 2019-09-27 DIAGNOSIS — I712 Thoracic aortic aneurysm, without rupture: Secondary | ICD-10-CM

## 2019-09-27 HISTORY — PX: THORACIC AORTIC ENDOVASCULAR STENT GRAFT: SHX6112

## 2019-09-27 HISTORY — PX: CAROTID-SUBCLAVIAN BYPASS GRAFT: SHX910

## 2019-09-27 HISTORY — PX: EMBOLIZATION: SHX5507

## 2019-09-27 LAB — CBC
HCT: 21.2 % — ABNORMAL LOW (ref 39.0–52.0)
HCT: 26.1 % — ABNORMAL LOW (ref 39.0–52.0)
Hemoglobin: 6.7 g/dL — CL (ref 13.0–17.0)
Hemoglobin: 8.3 g/dL — ABNORMAL LOW (ref 13.0–17.0)
MCH: 26.1 pg (ref 26.0–34.0)
MCH: 25.7 pg — ABNORMAL LOW (ref 26.0–34.0)
MCHC: 31.6 g/dL (ref 30.0–36.0)
MCHC: 31.8 g/dL (ref 30.0–36.0)
MCV: 82.5 fL (ref 80.0–100.0)
MCV: 80.8 fL (ref 80.0–100.0)
Platelets: 289 10*3/uL (ref 150–400)
Platelets: 250 10*3/uL (ref 150–400)
RBC: 2.57 MIL/uL — ABNORMAL LOW (ref 4.22–5.81)
RBC: 3.23 MIL/uL — ABNORMAL LOW (ref 4.22–5.81)
RDW: 17.7 % — ABNORMAL HIGH (ref 11.5–15.5)
RDW: 18 % — ABNORMAL HIGH (ref 11.5–15.5)
WBC: 12.8 10*3/uL — ABNORMAL HIGH (ref 4.0–10.5)
WBC: 13.4 10*3/uL — ABNORMAL HIGH (ref 4.0–10.5)
nRBC: 0 % (ref 0.0–0.2)
nRBC: 0 % (ref 0.0–0.2)

## 2019-09-27 LAB — SURGICAL PCR SCREEN
MRSA, PCR: NEGATIVE
Staphylococcus aureus: NEGATIVE

## 2019-09-27 LAB — RENAL FUNCTION PANEL
Albumin: 1.9 g/dL — ABNORMAL LOW (ref 3.5–5.0)
Anion gap: 11 (ref 5–15)
BUN: 38 mg/dL — ABNORMAL HIGH (ref 6–20)
CO2: 24 mmol/L (ref 22–32)
Calcium: 8.6 mg/dL — ABNORMAL LOW (ref 8.9–10.3)
Chloride: 96 mmol/L — ABNORMAL LOW (ref 98–111)
Creatinine, Ser: 6.76 mg/dL — ABNORMAL HIGH (ref 0.61–1.24)
GFR calc Af Amer: 9 mL/min — ABNORMAL LOW (ref >60)
GFR calc non Af Amer: 8 mL/min — ABNORMAL LOW (ref >60)
Glucose, Bld: 120 mg/dL — ABNORMAL HIGH (ref 70–99)
Phosphorus: 6.4 mg/dL — ABNORMAL HIGH (ref 2.5–4.6)
Potassium: 5.6 mmol/L — ABNORMAL HIGH (ref 3.5–5.1)
Sodium: 131 mmol/L — ABNORMAL LOW (ref 135–145)

## 2019-09-27 LAB — PREPARE RBC (CROSSMATCH)

## 2019-09-27 LAB — PROTIME-INR
INR: 1.4 — ABNORMAL HIGH (ref 0.8–1.2)
Prothrombin Time: 17.3 seconds — ABNORMAL HIGH (ref 11.4–15.2)

## 2019-09-27 LAB — TROPONIN I (HIGH SENSITIVITY): Troponin I (High Sensitivity): 43 ng/L — ABNORMAL HIGH (ref <18)

## 2019-09-27 LAB — APTT: aPTT: 61 seconds — ABNORMAL HIGH (ref 24–36)

## 2019-09-27 LAB — LACTIC ACID, PLASMA: Lactic Acid, Venous: 2 mmol/L (ref 0.5–1.9)

## 2019-09-27 LAB — GLUCOSE, CAPILLARY: Glucose-Capillary: 98 mg/dL (ref 70–99)

## 2019-09-27 SURGERY — INSERTION, ENDOVASCULAR STENT GRAFT, AORTA, THORACIC
Anesthesia: General

## 2019-09-27 MED ORDER — HEPARIN SODIUM (PORCINE) 1000 UNIT/ML IJ SOLN
INTRAMUSCULAR | Status: AC
  Filled 2019-09-27: qty 1

## 2019-09-27 MED ORDER — 0.9 % SODIUM CHLORIDE (POUR BTL) OPTIME
TOPICAL | Status: DC | PRN
  Administered 2019-09-27: 1000 mL

## 2019-09-27 MED ORDER — ALBUMIN HUMAN 5 % IV SOLN
INTRAVENOUS | Status: DC | PRN
  Administered 2019-09-27 (×2): via INTRAVENOUS

## 2019-09-27 MED ORDER — PHENOL 1.4 % MT LIQD: 1.0000 | OROMUCOSAL | Status: DC | PRN

## 2019-09-27 MED ORDER — PHENYLEPHRINE 40 MCG/ML (10ML) SYRINGE FOR IV PUSH (FOR BLOOD PRESSURE SUPPORT)
PREFILLED_SYRINGE | INTRAVENOUS | Status: DC | PRN
  Administered 2019-09-27: 120 ug via INTRAVENOUS

## 2019-09-27 MED ORDER — HYDROMORPHONE HCL 1 MG/ML IJ SOLN
0.5000 mg | INTRAMUSCULAR | Status: DC | PRN
  Administered 2019-09-28 – 2019-09-29 (×4): 1 mg via INTRAVENOUS
  Filled 2019-09-27 (×5): qty 1

## 2019-09-27 MED ORDER — PROPOFOL 10 MG/ML IV BOLUS
INTRAVENOUS | Status: AC
  Filled 2019-09-27: qty 20

## 2019-09-27 MED ORDER — PHENYLEPHRINE 40 MCG/ML (10ML) SYRINGE FOR IV PUSH (FOR BLOOD PRESSURE SUPPORT)
PREFILLED_SYRINGE | INTRAVENOUS | Status: AC
  Filled 2019-09-27: qty 10

## 2019-09-27 MED ORDER — SUGAMMADEX SODIUM 200 MG/2ML IV SOLN
INTRAVENOUS | Status: DC | PRN
  Administered 2019-09-27: 168.8 mg via INTRAVENOUS
  Administered 2019-09-27: 80 mg via INTRAVENOUS

## 2019-09-27 MED ORDER — ONDANSETRON HCL 4 MG/2ML IJ SOLN
INTRAMUSCULAR | Status: DC | PRN
  Administered 2019-09-27: 4 mg via INTRAVENOUS

## 2019-09-27 MED ORDER — HYDRALAZINE HCL 20 MG/ML IJ SOLN: 5.0000 mg | INTRAMUSCULAR | Status: DC | PRN

## 2019-09-27 MED ORDER — PROTAMINE SULFATE 10 MG/ML IV SOLN
INTRAVENOUS | Status: DC | PRN
  Administered 2019-09-27 (×4): 10 mg via INTRAVENOUS

## 2019-09-27 MED ORDER — DOCUSATE SODIUM 100 MG PO CAPS
100.0000 mg | ORAL_CAPSULE | Freq: Every day | ORAL | Status: DC
  Administered 2019-09-28 – 2019-09-29 (×2): 100 mg via ORAL
  Filled 2019-09-27 (×3): qty 1

## 2019-09-27 MED ORDER — SODIUM CHLORIDE 0.9 % IV SOLN
INTRAVENOUS | Status: AC
  Filled 2019-09-27: qty 1.2

## 2019-09-27 MED ORDER — ONDANSETRON HCL 4 MG/2ML IJ SOLN
INTRAMUSCULAR | Status: AC
  Filled 2019-09-27: qty 2

## 2019-09-27 MED ORDER — LACTATED RINGERS IV SOLN: INTRAVENOUS | Status: DC

## 2019-09-27 MED ORDER — CEFAZOLIN SODIUM-DEXTROSE 1-4 GM/50ML-% IV SOLN
1.0000 g | Freq: Once | INTRAVENOUS | Status: AC
  Administered 2019-09-28: 1 g via INTRAVENOUS
  Filled 2019-09-27: qty 50

## 2019-09-27 MED ORDER — PROTAMINE SULFATE 10 MG/ML IV SOLN
INTRAVENOUS | Status: AC
  Filled 2019-09-27: qty 5

## 2019-09-27 MED ORDER — CEFAZOLIN SODIUM 1 G IJ SOLR
INTRAMUSCULAR | Status: AC
  Filled 2019-09-27: qty 20

## 2019-09-27 MED ORDER — CALCIUM CHLORIDE 10 % IV SOLN
INTRAVENOUS | Status: DC | PRN
  Administered 2019-09-27 (×7): 100 mg via INTRAVENOUS

## 2019-09-27 MED ORDER — SODIUM CHLORIDE 0.9% IV SOLUTION: Freq: Once | INTRAVENOUS | Status: DC

## 2019-09-27 MED ORDER — ACETAMINOPHEN 325 MG PO TABS: 325.0000 mg | ORAL_TABLET | Freq: Once | ORAL | Status: DC | PRN

## 2019-09-27 MED ORDER — PROMETHAZINE HCL 25 MG/ML IJ SOLN: 6.2500 mg | INTRAMUSCULAR | Status: DC | PRN

## 2019-09-27 MED ORDER — SODIUM CHLORIDE 0.9 % IV SOLN
INTRAVENOUS | Status: DC | PRN
  Administered 2019-09-27: 10:00:00 via INTRAVENOUS

## 2019-09-27 MED ORDER — PROPOFOL 10 MG/ML IV BOLUS
INTRAVENOUS | Status: DC | PRN
  Administered 2019-09-27: 500 mg via INTRAVENOUS

## 2019-09-27 MED ORDER — CEFAZOLIN SODIUM-DEXTROSE 2-4 GM/100ML-% IV SOLN
2.0000 g | Freq: Three times a day (TID) | INTRAVENOUS | Status: DC
  Filled 2019-09-27: qty 100

## 2019-09-27 MED ORDER — FENTANYL CITRATE (PF) 250 MCG/5ML IJ SOLN
INTRAMUSCULAR | Status: AC
  Filled 2019-09-27: qty 5

## 2019-09-27 MED ORDER — SODIUM CHLORIDE 0.9 % IV SOLN
INTRAVENOUS | Status: DC | PRN
  Administered 2019-09-27: 500 mL

## 2019-09-27 MED ORDER — MIDAZOLAM HCL 5 MG/5ML IJ SOLN
INTRAMUSCULAR | Status: DC | PRN
  Administered 2019-09-27: 2 mg via INTRAVENOUS

## 2019-09-27 MED ORDER — MUPIROCIN 2 % EX OINT
1.0000 "application " | TOPICAL_OINTMENT | Freq: Two times a day (BID) | CUTANEOUS | Status: DC
  Administered 2019-09-27 – 2019-09-30 (×7): 1 via NASAL
  Filled 2019-09-27 (×3): qty 22

## 2019-09-27 MED ORDER — FENTANYL CITRATE (PF) 250 MCG/5ML IJ SOLN
INTRAMUSCULAR | Status: DC | PRN
  Administered 2019-09-27: 50 ug via INTRAVENOUS

## 2019-09-27 MED ORDER — CALCIUM CHLORIDE 10 % IV SOLN
INTRAVENOUS | Status: AC
  Filled 2019-09-27: qty 10

## 2019-09-27 MED ORDER — VASOPRESSIN 20 UNIT/ML IV SOLN
INTRAVENOUS | Status: AC
  Filled 2019-09-27: qty 1

## 2019-09-27 MED ORDER — ACETAMINOPHEN 325 MG RE SUPP: 325.0000 mg | RECTAL | Status: DC | PRN

## 2019-09-27 MED ORDER — HEPARIN SODIUM (PORCINE) 1000 UNIT/ML IJ SOLN
INTRAMUSCULAR | Status: DC | PRN
  Administered 2019-09-27: 2000 [IU] via INTRAVENOUS
  Administered 2019-09-27: 8000 [IU] via INTRAVENOUS
  Administered 2019-09-27: 2000 [IU] via INTRAVENOUS

## 2019-09-27 MED ORDER — MEPERIDINE HCL 25 MG/ML IJ SOLN: 6.2500 mg | INTRAMUSCULAR | Status: DC | PRN

## 2019-09-27 MED ORDER — CEFAZOLIN SODIUM-DEXTROSE 2-3 GM-%(50ML) IV SOLR
INTRAVENOUS | Status: DC | PRN
  Administered 2019-09-27: 2 g via INTRAVENOUS

## 2019-09-27 MED ORDER — IODIXANOL 320 MG/ML IV SOLN
INTRAVENOUS | Status: DC | PRN
  Administered 2019-09-27: 150 mL

## 2019-09-27 MED ORDER — POTASSIUM CHLORIDE CRYS ER 20 MEQ PO TBCR: 20.0000 meq | EXTENDED_RELEASE_TABLET | Freq: Every day | ORAL | Status: DC | PRN

## 2019-09-27 MED ORDER — HYDROMORPHONE HCL 1 MG/ML IJ SOLN: 0.2500 mg | INTRAMUSCULAR | Status: DC | PRN

## 2019-09-27 MED ORDER — ACETAMINOPHEN 10 MG/ML IV SOLN: 1000.0000 mg | Freq: Once | INTRAVENOUS | Status: DC | PRN

## 2019-09-27 MED ORDER — HEPARIN SODIUM (PORCINE) 5000 UNIT/ML IJ SOLN: 5000.0000 [IU] | Freq: Three times a day (TID) | INTRAMUSCULAR | Status: DC

## 2019-09-27 MED ORDER — ACETAMINOPHEN 160 MG/5ML PO SOLN: 325.0000 mg | Freq: Once | ORAL | Status: DC | PRN

## 2019-09-27 MED ORDER — GUAIFENESIN-DM 100-10 MG/5ML PO SYRP: 15.0000 mL | ORAL_SOLUTION | ORAL | Status: DC | PRN

## 2019-09-27 MED ORDER — LABETALOL HCL 5 MG/ML IV SOLN: 10.0000 mg | INTRAVENOUS | Status: DC | PRN

## 2019-09-27 MED ORDER — VASOPRESSIN 20 UNIT/ML IV SOLN
INTRAVENOUS | Status: DC | PRN
  Administered 2019-09-27: 1 [IU] via INTRAVENOUS
  Administered 2019-09-27 (×2): 2 [IU] via INTRAVENOUS
  Administered 2019-09-27 (×2): 1 [IU] via INTRAVENOUS
  Administered 2019-09-27: 2 [IU] via INTRAVENOUS
  Administered 2019-09-27 (×3): 1 [IU] via INTRAVENOUS
  Administered 2019-09-27: 2 [IU] via INTRAVENOUS

## 2019-09-27 MED ORDER — ACETAMINOPHEN 325 MG PO TABS
325.0000 mg | ORAL_TABLET | ORAL | Status: DC | PRN
  Administered 2019-09-29 – 2019-09-30 (×3): 650 mg via ORAL
  Filled 2019-09-27 (×3): qty 2

## 2019-09-27 MED ORDER — METOPROLOL TARTRATE 5 MG/5ML IV SOLN: 2.0000 mg | INTRAVENOUS | Status: DC | PRN

## 2019-09-27 MED ORDER — ROCURONIUM BROMIDE 10 MG/ML (PF) SYRINGE
PREFILLED_SYRINGE | INTRAVENOUS | Status: AC
  Filled 2019-09-27: qty 20

## 2019-09-27 MED ORDER — ALUM & MAG HYDROXIDE-SIMETH 200-200-20 MG/5ML PO SUSP: 15.0000 mL | ORAL | Status: DC | PRN

## 2019-09-27 MED ORDER — MAGNESIUM SULFATE 2 GM/50ML IV SOLN: 2.0000 g | Freq: Every day | INTRAVENOUS | Status: DC | PRN

## 2019-09-27 MED ORDER — MIDAZOLAM HCL 2 MG/2ML IJ SOLN
INTRAMUSCULAR | Status: AC
  Filled 2019-09-27: qty 2

## 2019-09-27 MED ORDER — ROCURONIUM BROMIDE 10 MG/ML (PF) SYRINGE
PREFILLED_SYRINGE | INTRAVENOUS | Status: DC | PRN
  Administered 2019-09-27 (×3): 50 mg via INTRAVENOUS

## 2019-09-27 MED ORDER — SODIUM CHLORIDE 0.9 % IV SOLN: 500.0000 mL | Freq: Once | INTRAVENOUS | Status: DC | PRN

## 2019-09-27 MED ORDER — ONDANSETRON HCL 4 MG/2ML IJ SOLN: 4.0000 mg | Freq: Four times a day (QID) | INTRAMUSCULAR | Status: DC | PRN

## 2019-09-27 MED ORDER — NOREPINEPHRINE 4 MG/250ML-% IV SOLN
0.0000 ug/min | INTRAVENOUS | Status: DC
  Administered 2019-09-27: 2 ug/min via INTRAVENOUS
  Filled 2019-09-27: qty 250

## 2019-09-27 MED ORDER — LIDOCAINE 2% (20 MG/ML) 5 ML SYRINGE
INTRAMUSCULAR | Status: AC
  Filled 2019-09-27: qty 5

## 2019-09-27 MED ORDER — PHENYLEPHRINE HCL-NACL 10-0.9 MG/250ML-% IV SOLN
INTRAVENOUS | Status: DC | PRN
  Administered 2019-09-27: 50 ug/min via INTRAVENOUS

## 2019-09-27 MED ORDER — PANTOPRAZOLE SODIUM 40 MG PO TBEC
40.0000 mg | DELAYED_RELEASE_TABLET | Freq: Every day | ORAL | Status: DC
  Administered 2019-09-28 – 2019-09-30 (×3): 40 mg via ORAL
  Filled 2019-09-27 (×3): qty 1

## 2019-09-27 SURGICAL SUPPLY — 70 items
CANISTER SUCT 3000ML PPV (MISCELLANEOUS) ×4 IMPLANT
CANNULA VESSEL 3MM 2 BLNT TIP (CANNULA) ×4 IMPLANT
CATH ACCU-VU SIZ PIG 5F 100CM (CATHETERS) ×4 IMPLANT
CATH ANGIO 5F BER2 100CM (CATHETERS) ×4 IMPLANT
CATH BEACON 5 .035 40 KMP TP (CATHETERS) ×2 IMPLANT
CATH BEACON 5 .038 40 KMP TP (CATHETERS) ×2
CATH BEACON 5.038 65CM KMP-01 (CATHETERS) ×4 IMPLANT
CLIP VESOCCLUDE MED 6/CT (CLIP) ×4 IMPLANT
CLIP VESOCCLUDE SM WIDE 6/CT (CLIP) ×4 IMPLANT
COIL NESTER 14X12 (Embolic) ×16 IMPLANT
COVER PROBE W GEL 5X96 (DRAPES) ×4 IMPLANT
COVER WAND RF STERILE (DRAPES) ×4 IMPLANT
DERMABOND ADVANCED (GAUZE/BANDAGES/DRESSINGS) ×4
DERMABOND ADVANCED .7 DNX12 (GAUZE/BANDAGES/DRESSINGS) ×4 IMPLANT
DEVICE CLOSURE PERCLS PRGLD 6F (VASCULAR PRODUCTS) IMPLANT
DRSG TEGADERM 2-3/8X2-3/4 SM (GAUZE/BANDAGES/DRESSINGS) IMPLANT
DRYSEAL FLEXSHEATH 22FR 33CM (SHEATH) ×2
ELECT CAUTERY BLADE 6.4 (BLADE) ×4 IMPLANT
ELECT REM PT RETURN 9FT ADLT (ELECTROSURGICAL) ×4
ELECTRODE REM PT RTRN 9FT ADLT (ELECTROSURGICAL) ×2 IMPLANT
GLOVE BIO SURGEON STRL SZ 6.5 (GLOVE) ×3 IMPLANT
GLOVE BIO SURGEON STRL SZ7.5 (GLOVE) ×4 IMPLANT
GLOVE BIO SURGEONS STRL SZ 6.5 (GLOVE) ×1
GLOVE BIOGEL PI IND STRL 7.5 (GLOVE) ×2 IMPLANT
GLOVE BIOGEL PI IND STRL 8 (GLOVE) ×8 IMPLANT
GLOVE BIOGEL PI INDICATOR 7.5 (GLOVE) ×2
GLOVE BIOGEL PI INDICATOR 8 (GLOVE) ×8
GOWN STRL REUS W/ TWL LRG LVL3 (GOWN DISPOSABLE) ×6 IMPLANT
GOWN STRL REUS W/TWL LRG LVL3 (GOWN DISPOSABLE) ×6
GRAFT HEMASHIELD 8MM (Vascular Products) ×2 IMPLANT
GRAFT VASC STRG 30X8KNIT (Vascular Products) ×2 IMPLANT
KIT BASIN OR (CUSTOM PROCEDURE TRAY) ×4 IMPLANT
KIT TURNOVER KIT B (KITS) ×4 IMPLANT
LOOP VESSEL MAXI BLUE (MISCELLANEOUS) ×8 IMPLANT
LOOP VESSEL MINI RED (MISCELLANEOUS) ×8 IMPLANT
NEEDLE PERC 18GX7CM (NEEDLE) ×4 IMPLANT
NS IRRIG 1000ML POUR BTL (IV SOLUTION) ×4 IMPLANT
PACK ENDOVASCULAR (PACKS) ×4 IMPLANT
PACK UNIVERSAL I (CUSTOM PROCEDURE TRAY) IMPLANT
PAD ARMBOARD 7.5X6 YLW CONV (MISCELLANEOUS) ×8 IMPLANT
PENCIL BUTTON HOLSTER BLD 10FT (ELECTRODE) ×4 IMPLANT
PERCLOSE PROGLIDE 6F (VASCULAR PRODUCTS)
PUNCH AORTIC ROTATE 5MM 8IN (MISCELLANEOUS) ×4 IMPLANT
SHEATH AVANTI 11CM 8FR (SHEATH) IMPLANT
SHEATH DRYSEAL FLEX 22FR 33CM (SHEATH) ×2 IMPLANT
SHEATH PINNACLE 8F 10CM (SHEATH) ×4 IMPLANT
STAPLER VISISTAT (STAPLE) IMPLANT
STENT GRFT THORAC ACS 34X34X15 (Endovascular Graft) ×4 IMPLANT
STOPCOCK MORSE 400PSI 3WAY (MISCELLANEOUS) ×4 IMPLANT
SUT PROLENE 5 0 C 1 24 (SUTURE) ×20 IMPLANT
SUT SILK 2 0 (SUTURE)
SUT SILK 2-0 18XBRD TIE 12 (SUTURE) IMPLANT
SUT SILK 3 0 (SUTURE) ×2
SUT SILK 3-0 18XBRD TIE 12 (SUTURE) ×2 IMPLANT
SUT SILK 4 0 (SUTURE) ×2
SUT SILK 4-0 18XBRD TIE 12 (SUTURE) ×2 IMPLANT
SUT VIC AB 2-0 CTB1 (SUTURE) ×8 IMPLANT
SUT VIC AB 3-0 SH 27 (SUTURE) ×4
SUT VIC AB 3-0 SH 27X BRD (SUTURE) ×4 IMPLANT
SUT VICRYL 4-0 PS2 18IN ABS (SUTURE) ×8 IMPLANT
SYR 30ML LL (SYRINGE) ×4 IMPLANT
SYR 50ML LL SCALE MARK (SYRINGE) IMPLANT
SYR BULB IRRIGATION 50ML (SYRINGE) ×4 IMPLANT
TOWEL GREEN STERILE (TOWEL DISPOSABLE) ×4 IMPLANT
TRAY FOLEY MTR SLVR 16FR STAT (SET/KITS/TRAYS/PACK) IMPLANT
TUBING HIGH PRESSURE 120CM (CONNECTOR) ×4 IMPLANT
TUBING INJECTOR 48 (MISCELLANEOUS) ×4 IMPLANT
WIRE BENTSON .035X145CM (WIRE) ×4 IMPLANT
WIRE HI TORQ VERSACORE J 260CM (WIRE) ×4 IMPLANT
WIRE STIFF LUNDERQUIST 260CM (WIRE) ×4 IMPLANT

## 2019-09-27 NOTE — Anesthesia Procedure Notes (Signed)
Arterial Line Insertion Start/End12/17/2020 10:00 AM, 09/27/2019 10:05 AM Performed by: CRNA  Patient location: OR. Preanesthetic checklist: patient identified, IV checked, site marked, risks and benefits discussed, surgical consent, monitors and equipment checked, pre-op evaluation, timeout performed and anesthesia consent Lidocaine 1% used for infiltration Right, radial was placed Catheter size: 20 G Hand hygiene performed , maximum sterile barriers used  and Seldinger technique used Allen's test indicative of satisfactory collateral circulation Attempts: 2 Procedure performed without using ultrasound guided technique. Following insertion, dressing applied and Biopatch. Post procedure assessment: normal  Patient tolerated the procedure well with no immediate complications.

## 2019-09-27 NOTE — Anesthesia Preprocedure Evaluation (Addendum)
Anesthesia Evaluation  Patient identified by MRN, date of birth, ID band Patient awake    Reviewed: Allergy & Precautions, NPO status , Patient's Chart, lab work & pertinent test results  Airway Mallampati: II  TM Distance: >3 FB Neck ROM: Full    Dental  (+) Upper Dentures   Pulmonary Current Smoker,    breath sounds clear to auscultation       Cardiovascular hypertension, Pt. on medications and Pt. on home beta blockers + dysrhythmias  Rhythm:Regular Rate:Normal     Neuro/Psych PSYCHIATRIC DISORDERS Depression CVA    GI/Hepatic PUD, (+) Hepatitis -, C  Endo/Other    Renal/GU ESRF and DialysisRenal disease     Musculoskeletal  (+) Arthritis , Osteoarthritis,    Abdominal Normal abdominal exam  (+)   Peds  Hematology   Anesthesia Other Findings   Reproductive/Obstetrics                            Echo:   1. The left ventricle has normal systolic function, with an ejection fraction of 55-60%. No evidence of left ventricular regional wall motion abnormalities.  2. The right ventricle has normal systolc function. The cavity was normal.  3. Trivial pericardial effusion is present.  4. The aortic valve is tricuspid Mild thickening of the aortic valve. No stenosis of the aortic valve.  5. There is evidence of mild plaque in the descending aorta.  6. Normal LV function; no vegetations.  Anesthesia Physical Anesthesia Plan  ASA: IV  Anesthesia Plan: General   Post-op Pain Management:    Induction: Intravenous  PONV Risk Score and Plan: 2 and Ondansetron and Treatment may vary due to age or medical condition  Airway Management Planned: Oral ETT  Additional Equipment: Arterial line and CVP  Intra-op Plan:   Post-operative Plan: Possible Post-op intubation/ventilation  Informed Consent: I have reviewed the patients History and Physical, chart, labs and discussed the procedure  including the risks, benefits and alternatives for the proposed anesthesia with the patient or authorized representative who has indicated his/her understanding and acceptance.     Dental advisory given  Plan Discussed with: CRNA  Anesthesia Plan Comments:         Anesthesia Quick Evaluation

## 2019-09-27 NOTE — Transfer of Care (Signed)
Immediate Anesthesia Transfer of Care Note  Patient: Frank Fuller  Procedure(s) Performed: Right Femoral artery groin exposure,THORACIC  ENDOVASCULAR STENT PLACEMENT with 73mm stent (N/A ) Open subclavian-carotid transposition with tevar using velour vascular graft size 80mm (Left ) Embolization coils times four size 25mm placed in left sublclavian artery near aortic arch (Left )  Patient Location: PACU  Anesthesia Type:General  Level of Consciousness: awake, drowsy and patient cooperative  Airway & Oxygen Therapy: Patient Spontanous Breathing and Patient connected to face mask oxygen  Post-op Assessment: Report given to RN and Post -op Vital signs reviewed and stable  Post vital signs: Reviewed and stable  Last Vitals:  Vitals Value Taken Time  BP 113/64 10/15/2019 1328  Temp    Pulse 72 10/09/2019 1333  Resp 19 09/26/2019 1333  SpO2 98 % 10/21/2019 1333  Vitals shown include unvalidated device data.  Last Pain:  Vitals:   10/19/2019 0819  TempSrc: Oral  PainSc:          Complications: No apparent anesthesia complications

## 2019-09-27 NOTE — Progress Notes (Addendum)
CRITICAL VALUE ALERT  Critical Value:  Hgb 6.7  Date & Time Notied:  [id number]  0600  Provider Notified: Dr. Gae Gallop  Orders Received/Actions taken: Transfuse 2 units of PRBC

## 2019-09-27 NOTE — Op Note (Signed)
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  Placement of left femoral double-lumen catheter 2.  Left carotid subclavian bypass 3.  Right common femoral artery exposure 4.  Nonselective catheter placement in ascending aorta 5.  Endovascular repair of thoracic aortic pseudoaneurysm 6.  Coil embolization of left subclavian artery with 4 x 12 mm Nester coils  SURGEON: Judeth Cornfield. Scot Dock, MD  ASSIST: Izetta Dakin, RNFA  ANESTHESIA: General  EBL: 150 cc  INDICATIONS:    Frank Fuller is a 58 y.o. 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 Oneida Alar.  He presented with a sudden onset of chest and back pain and was found to have a pseudoaneurysm at the proximal stent and an endoleak likely explaining his symptoms.  He is brought for extension of the proximal graft and carotid subclavian bypass.  FINDINGS:   Completion film showed preservation of the innominate artery and excellent positioning of the extension.  He had biphasic Doppler signals in the right foot at the completion of the procedure.  TECHNIQUE:   Initially because of poor IV access I placed a double-lumen left femoral catheter under sterile conditions.  Next the right groin abdomen chest and left supraclavicular area were prepped and draped in usual sterile fashion.  A transverse incision was made above the clavicle and the dissection carried down through the platysma to expose scalene fat pad which was mobilized superiorly and laterally.  The anterior scalene muscle and phrenic nerve were exposed with careful preservation of the phrenic nerve.  The anterior scalene muscle was divided carefully using electrocautery which exposed the left subclavian artery.   This was controlled with a vessel loop.  Through the same incision the internal jugular vein was mobilized and posterior to this the carotid artery was identified and the vagus nerve identified.  The carotid artery was controlled with a vessel loop.  The patient was heparinized.  The subclavian artery was clamped proximally and distally and a longitudinal arteriotomy was made.  An 8 mm graft was slightly spatulated and sewn end-to-side to the left subclavian artery using a continuous 5-0 Prolene suture.  The there was excellent backbleeding the graft was flushed.  The graft was clamped.  The common carotid artery was then clamped proximally and distally and a longitudinal arteriotomy was made.  The graft cut the appropriate length and sewn end-to-side to the left common carotid artery using continuous 5-0 Prolene suture.  Prior to completing anastomosis, the patient was placed in Trendelenburg and the arteries were backbled and flushed and the anastomosis completed.  Clamps were released and there was good hemostasis.  This wound was left open for now as the patient was fully heparinized.  Next a transverse incision was made just below the inguinal crease on the right where the common femoral artery was dissected free.  There was significant scar tissue where had previously been Perclose.  The artery was exposed proximally and distally and loops placed proximally and distally.  The artery was cannulated with a needle and then a Bentson wire introduced into the graft under fluoroscopic control.  An 8 French sheath was placed over the wire.  Next I advanced the wire up  into the ascending aorta and using a catheter exchange this for the Amplatz wire.  The 61 French dry seal sheath was advanced over the wire and positioned just below the old graft.  Using a buddy wire technique the pigtail catheter was advanced into the ascending aorta and the appropriate angles were ascertained and arch aortogram obtained.  The  position of the innominate artery was identified with plans to position the graft at this level preserving flow to the innominate artery and covering the left subclavian artery.  The graft was then advanced so that its slightly beyond the innominate position on the left side.  This was then deployed in 2 stages.  After deployment the innominate artery was fully preserved.  The graft was in excellent position.  At this point in order to prevent a type II endoleak at the subclavian level we elected to coil embolize the subclavian artery above the graft.  The patient had a left radial A-line.  A micropuncture wire was advanced through the A-line and a 6 French radial sheath placed without difficulty.  I then used a long Bentson wire which was directed into the subclavian artery up to the thoracic graft.  Kumpe catheter was advanced over the wire and the subclavian artery was then coil embolized using 4 12 mm Nester coils.  Prior to placing the coils position of the vertebral artery was identified with subclavian arteriogram.  There was no evidence of a type II endoleak after injecting contrast into the subclavian artery.  The heparin was partially reversed with protamine.  The supraclavicular incision on the left was irrigated and hemostasis obtained.  This was closed with 2 deep layers of 3-0 Vicryl and the skin closed with 4-0 Vicryl.    Attention was then turned to the sheath in the right groin.  A wire was advanced through the sheath and then the sheath was retracted over the wire and the artery clamped proximally and distally.  The wire was then removed after flushing demonstrated excellent inflow.  The hole in the artery was repaired with interrupted 5-0 Prolene sutures.  There was good hemostasis and a good pulse distal to the repair.  The right groin was irrigated and hemostasis obtained.   This wound was closed with a deep layer of 3-0 Vicryl, a subcutaneous layer with 3-0 Vicryl and the skin closed with  4-0 Vicryl.  There was a biphasic Doppler signal in the dorsalis pedis and posterior tibial position of the right foot at the completion.  Patient tolerated the procedure well was transferred to the recovery room in stable condition.  All needle and sponge counts were correct.    [name], MD, FACS [facility]  DATE OF DICTATION:   09/25/2019

## 2019-09-27 NOTE — Anesthesia Procedure Notes (Signed)
Procedure Name: Intubation Date/Time: 10/21/2019 10:15 AM Performed by: Renato Shin, CRNA Pre-anesthesia Checklist: Patient identified, Emergency Drugs available, Suction available and Patient being monitored Patient Re-evaluated:Patient Re-evaluated prior to induction Oxygen Delivery Method: Circle system utilized Preoxygenation: Pre-oxygenation with 100% oxygen Induction Type: IV induction Ventilation: Mask ventilation without difficulty Laryngoscope Size: Miller and 3 Grade View: Grade I Tube type: Oral Tube size: 7.5 mm Number of attempts: 1 Airway Equipment and Method: Stylet and Oral airway Placement Confirmation: ETT inserted through vocal cords under direct vision,  positive ETCO2 and breath sounds checked- equal and bilateral Secured at: 21 cm Tube secured with: Tape Dental Injury: Teeth and Oropharynx as per pre-operative assessment
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
## 2019-09-27 NOTE — Anesthesia Postprocedure Evaluation (Signed)
Anesthesia Post Note  Patient: Pellegrino Baine  Procedure(s) Performed: Right Femoral artery groin exposure,THORACIC  ENDOVASCULAR STENT PLACEMENT with 53mm stent (N/A ) Open subclavian-carotid transposition with tevar using velour vascular graft size 32mm (Left ) Embolization coils times four size 57mm placed in left sublclavian artery near aortic arch (Left )     Patient location during evaluation: PACU Anesthesia Type: General Level of consciousness: awake and alert Pain management: pain level controlled Vital Signs Assessment: post-procedure vital signs reviewed and stable Respiratory status: spontaneous breathing, nonlabored ventilation, respiratory function stable and patient connected to nasal cannula oxygen Cardiovascular status: blood pressure returned to baseline and stable Postop Assessment: no apparent nausea or vomiting Anesthetic complications: no    Last Vitals:  Vitals:   10/14/2019 1343 09/26/2019 1357  BP: 115/66 113/65  Pulse: 72 77  Resp: 19 18  Temp:  36.4 C  SpO2: 99% 100%    Last Pain:  Vitals:   10/21/2019 1357  TempSrc:   PainSc: 0-No pain                 Effie Berkshire

## 2019-09-27 NOTE — Progress Notes (Signed)
CRITICAL VALUE ALERT  Critical Value:  Lactic 2.0  Date & Time Notied:  [id number]  Provider Notified: Dr. Gae Gallop   Orders Received/Actions taken: MD to follow up

## 2019-09-27 NOTE — Anesthesia Procedure Notes (Signed)
Central Venous Catheter Insertion Performed by: attending Start/End12/19/2020 10:30 AM, 09/27/2019 10:30 AM Patient location: Pre-op. Preanesthetic checklist: patient identified, IV checked, site marked, risks and benefits discussed, surgical consent, monitors and equipment checked, pre-op evaluation, timeout performed and anesthesia consent Lidocaine 1% used for infiltration and patient sedated Hand hygiene performed  and maximum sterile barriers used  Catheter size: 8 Fr Total catheter length 16. Central line was placed.Double lumen Procedure performed using ultrasound guided technique. Ultrasound Notes:image(s) printed for medical record Attempts: 1 Following insertion, dressing applied and line sutured. Post procedure assessment: blood return through all ports  Patient tolerated the procedure well with no immediate complications. Additional procedure comments: Dr Scot Dock place CVC.

## 2019-09-27 NOTE — Progress Notes (Signed)
Patient arrives to Short Stay alert and oriented x 4. NSR on monitor.

## 2019-09-27 NOTE — Progress Notes (Signed)
   VASCULAR SURGERY ASSESSMENT & PLAN:   PSEUDOANEURYSM AT THE PROXIMAL STENT GRAFT SITE: This patient has a type I endoleak of his thoracic stent that was done in August of this year.  He has a pseudoaneurysm which is symptomatic.  He was comfortable last night but given the risk of rupture I have recommended that we proceed with repair of the type I endoleak.  This will involve extending the thoracic graft possibly past the left subclavian artery.  I have explained that if this is necessary we would have to do a carotid subclavian bypass.  He understands that this is a limb threatening problem.  We have reviewed the procedure and potential complications and he is agreeable to proceed.  He states that he does not have any family that he wishes me to call.  ACUTE BLOOD LOSS ANEMIA: Hemoglobin this morning is 6.8.  He is being transfused 2 units.  END-STAGE RENAL DISEASE: We will need to arrange for hemodialysis after surgery today.  SUBJECTIVE:   No pain overnight.  PHYSICAL EXAM:   Vitals:   10/20/2019 0400 10/23/2019 0500 09/26/2019 0600 10/09/2019 0615  BP: 105/69 138/65 112/67 112/70  Pulse: 80 81 80 81  Resp: 19 (!) 22 20 16   Temp: 98.9 F (37.2 C)     TempSrc: Oral     SpO2: 95% 97% 98% 96%  Weight:       Palpable radial pulses.  LABS:   Lab Results  Component Value Date   WBC 12.8 (H) 09/29/2019   HGB 6.7 (LL) 10/01/2019   HCT 21.2 (L) 10/21/2019   MCV 82.5 10/18/2019   PLT 289 09/23/2019   Lab Results  Component Value Date   CREATININE 6.24 (H) 10/10/2019   Lab Results  Component Value Date   INR 1.4 (H) 09/28/2019   CBG (last 3)  Recent Labs    09/23/2019 0338  GLUCAP 98    PROBLEM LIST:    Active Problems:   Thoracic aneurysm, ruptured (H[facility])   CURRENT MEDS:   . sodium chloride   Intravenous Once  . sodium chloride   Intravenous Once  . amLODipine  10 mg Oral QHS  . atorvastatin  20 mg Oral q1800  . citalopram  10 mg Oral Daily  . cloNIDine  0.1 mg  Oral BID  . heparin injection (subcutaneous)  5,000 Units Subcutaneous Q8H  . hydrALAZINE  100 mg Oral TID  . labetalol  400 mg Oral BID  . losartan  100 mg Oral Daily  . multivitamin  1 tablet Oral QHS  . mupirocin ointment  1 application Nasal BID  . pantoprazole  40 mg Oral Daily  . pregabalin  50 mg Oral BID  . sodium chloride flush  3 mL Intravenous Q12H  . spironolactone  25 mg Oral Daily  . witch hazel-glycerin   Topical TID [facility]    [name] Office: [phone] 09/26/2019

## 2019-09-27 NOTE — Anesthesia Procedure Notes (Signed)
Arterial Line Insertion Start/End12/03/2019 9:15 AM, 09/27/2019 9:20 AM Performed by: CRNA  Patient location: Pre-op. Preanesthetic checklist: patient identified, IV checked, site marked, risks and benefits discussed, surgical consent, monitors and equipment checked, pre-op evaluation, timeout performed and anesthesia consent Lidocaine 1% used for infiltration Left, radial was placed Catheter size: 20 G Hand hygiene performed , maximum sterile barriers used  and Seldinger technique used Allen's test indicative of satisfactory collateral circulation Attempts: 1 Procedure performed without using ultrasound guided technique. Following insertion, dressing applied and Biopatch. Post procedure assessment: normal  Patient tolerated the procedure well with no immediate complications.

## 2019-09-28 LAB — CBC
HCT: 27.7 % — ABNORMAL LOW (ref 39.0–52.0)
Hemoglobin: 9 g/dL — ABNORMAL LOW (ref 13.0–17.0)
MCH: 26.3 pg (ref 26.0–34.0)
MCHC: 32.5 g/dL (ref 30.0–36.0)
MCV: 81 fL (ref 80.0–100.0)
Platelets: 272 10*3/uL (ref 150–400)
RBC: 3.42 MIL/uL — ABNORMAL LOW (ref 4.22–5.81)
RDW: 18.6 % — ABNORMAL HIGH (ref 11.5–15.5)
WBC: 11.2 10*3/uL — ABNORMAL HIGH (ref 4.0–10.5)
nRBC: 0 % (ref 0.0–0.2)

## 2019-09-28 LAB — BASIC METABOLIC PANEL
Anion gap: 11 (ref 5–15)
BUN: 17 mg/dL (ref 6–20)
CO2: 27 mmol/L (ref 22–32)
Calcium: 8.3 mg/dL — ABNORMAL LOW (ref 8.9–10.3)
Chloride: 96 mmol/L — ABNORMAL LOW (ref 98–111)
Creatinine, Ser: 3.63 mg/dL — ABNORMAL HIGH (ref 0.61–1.24)
GFR calc Af Amer: 20 mL/min — ABNORMAL LOW (ref >60)
GFR calc non Af Amer: 17 mL/min — ABNORMAL LOW (ref >60)
Glucose, Bld: 95 mg/dL (ref 70–99)
Potassium: 3.8 mmol/L (ref 3.5–5.1)
Sodium: 134 mmol/L — ABNORMAL LOW (ref 135–145)

## 2019-09-28 MED ORDER — SODIUM CHLORIDE 0.9 % IV SOLN: 100.0000 mL | INTRAVENOUS | Status: DC | PRN

## 2019-09-28 MED ORDER — LABETALOL HCL 300 MG PO TABS
600.0000 mg | ORAL_TABLET | Freq: Two times a day (BID) | ORAL | Status: DC
  Administered 2019-09-28 – 2019-09-29 (×4): 600 mg via ORAL
  Filled 2019-09-28 (×4): qty 2

## 2019-09-28 MED ORDER — HEPARIN SODIUM (PORCINE) 1000 UNIT/ML IJ SOLN
INTRAMUSCULAR | Status: AC
  Administered 2019-09-28: 3400 [IU]
  Filled 2019-09-28: qty 4

## 2019-09-28 NOTE — Progress Notes (Signed)
Patient ID: Frank Fuller, male   DOB: 04-13-1961, 58 y.o.   MRN: [id number]  [facility] Progress Note   Assessment/ Plan:   1.  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 Scot Dock with left carotid subclavian bypass and coil embolization of left subclavian artery. 2.  End-stage renal disease: On a Tuesday/Thursday/Saturday schedule, underwent hemodialysis yesterday without problems.  Labs/clinical exam today without indications for additional dialysis. 3.  Anemia of ESRD with acute blood loss secondary to endoleak: Status post PRBC transfusion, will continue to trend hemoglobin/hematocrit to decide on additional PRBC needs. 4.  Hypertension: Blood pressures elevated, will increase labetalol dose to match his home dose. 5.  Secondary hyperparathyroidism: Not on binders as an outpatient, not on VDR A.  We will continue to trend calcium/phosphorus.  Subjective:   Reports to be feeling fair, fatigued from events yesterday.   Objective:   BP (!) 160/86   Pulse 93   Temp 99.4 F (37.4 C) (Oral)   Resp (!) 26   Wt 76.3 kg   SpO2 98%   BMI 20.48 kg/m   Physical Exam: Gen: Comfortably resting in bed CVS: Pulse regular rhythm, normal rate, S1 and S2 normal Resp: Clear to auscultation, no rales/rhonchi Abd: Soft, flat, nontender Ext: Trace lower extremity edema.  Right IJ [facility]  Labs: BMET Recent Labs  Lab 09/29/2019 2005 09/29/2019 1503 09/28/19 0531  NA 132* 131* 134*  K 4.3 5.6* 3.8  CL 92* 96* 96*  CO2 25 24 27   GLUCOSE 107* 120* 95  BUN 28* 38* 17  CREATININE 6.24* 6.76* 3.63*  CALCIUM 9.6 8.6* 8.3*  PHOS  --  6.4*  --    CBC Recent Labs  Lab 10/17/2019 2005 09/26/2019 0345 09/23/2019 1503 09/28/19 0531  WBC 15.7* 12.8* 13.4* 11.2*  NEUTROABS 13.3*  --   --   --   HGB 7.6* 6.7* 8.3* 9.0*  HCT 24.3* 21.2* 26.1* 27.7*  MCV 83.8 82.5 80.8  81.0  PLT 323 289 250 272   Medications:    . sodium chloride   Intravenous Once  . sodium chloride   Intravenous Once  . amLODipine  10 mg Oral QHS  . atorvastatin  20 mg Oral q1800  . citalopram  10 mg Oral Daily  . cloNIDine  0.1 mg Oral BID  . docusate sodium  100 mg Oral Daily  . heparin injection (subcutaneous)  5,000 Units Subcutaneous Q8H  . hydrALAZINE  100 mg Oral TID  . labetalol  400 mg Oral BID  . losartan  100 mg Oral Daily  . multivitamin  1 tablet Oral QHS  . mupirocin ointment  1 application Nasal BID  . pantoprazole  40 mg Oral Daily  . pregabalin  50 mg Oral BID  . sodium chloride flush  3 mL Intravenous Q12H  . spironolactone  25 mg Oral Daily  . witch hazel-glycerin   Topical TID [facility] & HS   Elmarie Shiley, MD 09/28/2019, 8:17 AM

## 2019-09-28 NOTE — Progress Notes (Signed)
Pt arrived tom 4e from 2h. Pt oriented to room and staff. Pt has allergy to CHG, so pt was bathed with soap and water. Vitals obtained. Telemetry box applied and CCMD notified x2 verifiers. Pt complained of back pain. PRN norco given. Will continue current plan of care.

## 2019-09-28 NOTE — Progress Notes (Signed)
   VASCULAR SURGERY ASSESSMENT & PLAN:   POD 1: LEFT CAROTID SUBCLAVIAN BYPASS/ENDOVASCULAR REPAIR OF THORACIC AORTIC PSEUDOANEURYSM: The patient is doing well.  He has no further chest pain or back pain.  He ate yesterday and did well with dialysis.  I will transfer him to 4 E. today.  We will let him rest today and plan on a CT of the chest on Monday.  He will likely be ready for discharge after dialysis on Tuesday.  END-STAGE RENAL DISEASE: He dialyzes Tuesdays Thursdays and Saturdays.  Appreciate Dr. Serita Grit help.  ACUTE BLOOD LOSS ANEMIA: His hemoglobin today is 9.0.  SUBJECTIVE:   Alert and oriented.  No specific complaints.  He is no longer having chest pain or back pain.  PHYSICAL EXAM:   Vitals:   09/28/19 0730 09/28/19 0749 09/28/19 0800 09/28/19 0830  BP: (!) 153/87  (!) 156/89 (!) 159/97  Pulse: 94  94 95  Resp: (!) 21  (!) 23 20  Temp:  99.4 F (37.4 C)    TempSrc:  Oral    SpO2: 93%  95% 92%  Weight:       Both feet are warm and well-perfused. The left hand is warm and well-perfused.  LABS:   Lab Results  Component Value Date   WBC 11.2 (H) 09/28/2019   HGB 9.0 (L) 09/28/2019   HCT 27.7 (L) 09/28/2019   MCV 81.0 09/28/2019   PLT 272 09/28/2019   Lab Results  Component Value Date   CREATININE 3.63 (H) 09/28/2019   Lab Results  Component Value Date   INR 1.4 (H) 09/26/2019   CBG (last 3)  Recent Labs    10/15/2019 0338  GLUCAP 98    PROBLEM LIST:    Active Problems:   Thoracic aneurysm, ruptured (HCC)   CURRENT MEDS:   . sodium chloride   Intravenous Once  . sodium chloride   Intravenous Once  . amLODipine  10 mg Oral QHS  . atorvastatin  20 mg Oral q1800  . citalopram  10 mg Oral Daily  . cloNIDine  0.1 mg Oral BID  . docusate sodium  100 mg Oral Daily  . heparin injection (subcutaneous)  5,000 Units Subcutaneous Q8H  . hydrALAZINE  100 mg Oral TID  . labetalol  600 mg Oral BID  . losartan  100 mg Oral Daily  . multivitamin  1 tablet  Oral QHS  . mupirocin ointment  1 application Nasal BID  . pantoprazole  40 mg Oral Daily  . pregabalin  50 mg Oral BID  . sodium chloride flush  3 mL Intravenous Q12H  . spironolactone  25 mg Oral Daily  . witch hazel-glycerin   Topical TID [facility] & HS    [name] Office: [phone] 09/28/2019

## 2019-09-28 NOTE — Plan of Care (Signed)
Poc progressing.  

## 2019-09-29 ENCOUNTER — Inpatient Hospital Stay (HOSPITAL_COMMUNITY): Payer: Medicare Other

## 2019-09-29 ENCOUNTER — Encounter (HOSPITAL_COMMUNITY): Payer: Self-pay | Admitting: Vascular Surgery

## 2019-09-29 LAB — POCT I-STAT 7, (LYTES, BLD GAS, ICA,H+H)
Bicarbonate: 24.8 mmol/L (ref 20.0–28.0)
Calcium, Ion: 1.13 mmol/L — ABNORMAL LOW (ref 1.15–1.40)
HCT: 15 % — ABNORMAL LOW (ref 39.0–52.0)
Hemoglobin: 5.1 g/dL — CL (ref 13.0–17.0)
O2 Saturation: 92 %
Patient temperature: 36.1
Potassium: 4.9 mmol/L (ref 3.5–5.1)
Sodium: 133 mmol/L — ABNORMAL LOW (ref 135–145)
TCO2: 26 mmol/L (ref 22–32)
pCO2 arterial: 39.7 mmHg (ref 32.0–48.0)
pH, Arterial: 7.4 (ref 7.350–7.450)
pO2, Arterial: 61 mmHg — ABNORMAL LOW (ref 83.0–108.0)

## 2019-09-29 LAB — POCT I-STAT, CHEM 8
BUN: 34 mg/dL — ABNORMAL HIGH (ref 6–20)
Calcium, Ion: 1.19 mmol/L (ref 1.15–1.40)
Chloride: 97 mmol/L — ABNORMAL LOW (ref 98–111)
Creatinine, Ser: 6.4 mg/dL — ABNORMAL HIGH (ref 0.61–1.24)
Glucose, Bld: 105 mg/dL — ABNORMAL HIGH (ref 70–99)
HCT: 25 % — ABNORMAL LOW (ref 39.0–52.0)
Hemoglobin: 8.5 g/dL — ABNORMAL LOW (ref 13.0–17.0)
Potassium: 5.5 mmol/L — ABNORMAL HIGH (ref 3.5–5.1)
Sodium: 131 mmol/L — ABNORMAL LOW (ref 135–145)
TCO2: 25 mmol/L (ref 22–32)

## 2019-09-29 LAB — POCT ACTIVATED CLOTTING TIME
Activated Clotting Time: 213 seconds
Activated Clotting Time: 164 seconds
Activated Clotting Time: 180 seconds

## 2019-09-29 MED ORDER — IOHEXOL 350 MG/ML SOLN
100.0000 mL | Freq: Once | INTRAVENOUS | Status: AC | PRN
  Administered 2019-09-29: 100 mL via INTRAVENOUS

## 2019-09-29 MED ORDER — ENSURE ENLIVE PO LIQD
237.0000 mL | Freq: Two times a day (BID) | ORAL | Status: DC
  Administered 2019-09-29 – 2019-09-30 (×2): 237 mL via ORAL

## 2019-09-29 MED ORDER — PRO-STAT SUGAR FREE PO LIQD
30.0000 mL | Freq: Two times a day (BID) | ORAL | Status: DC
  Filled 2019-09-29 (×2): qty 30

## 2019-09-29 NOTE — Progress Notes (Signed)
1.88 inch 20 gauge IV catheter placed under ultrasound guidance in left AC.  Brisk blood return and flushed with ease.  No leaking noted at upper arm PIV.  Plan to leave this upper arm IV in until after CT scan is completed.  Bedside nurse updated.

## 2019-09-29 NOTE — Progress Notes (Signed)
Patient to CT scan at this time

## 2019-09-29 NOTE — Progress Notes (Addendum)
[facility] Progress Note   Subjective:  Seen in room - just getting back from CT. C/o pain "all over" - requesting pain medication, RN informed. Denies sharp chest pains. No dyspnea.  Objective Vitals:   09/29/19 0741 09/29/19 0800 09/29/19 0917 09/29/19 1052  BP: 131/78   140/70  Pulse: 84 83  81  Resp: 16 18 15    Temp: 98.5 F (36.9 C)     TempSrc: Oral     SpO2: 95% (!) 87% 92% 92%  Weight:       Physical Exam General: Well appearing man, NAD Heart: RRR; 2/6 murmur Lungs: CTA anteriorly Abdomen: soft, non-tender Extremities: No LE edema Dialysis Access: [facility]  Additional Objective Labs: Basic Metabolic Panel: Recent Labs  Lab 10/13/2019 2005  10/12/2019 1216 10/11/2019 1503 09/28/19 0531  NA 132*   < > 131* 131* 134*  K 4.3   < > 5.5* 5.6* 3.8  CL 92*  --  97* 96* 96*  CO2 25  --   --  24 27  GLUCOSE 107*  --  105* 120* 95  BUN 28*  --  34* 38* 17  CREATININE 6.24*  --  6.40* 6.76* 3.63*  CALCIUM 9.6  --   --  8.6* 8.3*  PHOS  --   --   --  6.4*  --    < > = values in this interval not displayed.   Liver Function Tests: Recent Labs  Lab 10/04/2019 2005 10/14/2019 1503  AST 19  --   ALT 11  --   ALKPHOS 101  --   BILITOT 0.8  --   PROT 6.5  --   ALBUMIN 1.9* 1.9*   CBC: Recent Labs  Lab 10/08/2019 2005 10/11/2019 0345  10/05/2019 1216 10/19/2019 1503 09/28/19 0531  WBC 15.7* 12.8*  --   --  13.4* 11.2*  NEUTROABS 13.3*  --   --   --   --   --   HGB 7.6* 6.7*   < > 8.5* 8.3* 9.0*  HCT 24.3* 21.2*   < > 25.0* 26.1* 27.7*  MCV 83.8 82.5  --   --  80.8 81.0  PLT 323 289  --   --  250 272   < > = values in this interval not displayed.   Blood Culture    Component Value Date/Time   SDES BLOOD LEFT HAND 10/15/2019 0345   SPECREQUEST  10/03/2019 0345    BOTTLES DRAWN AEROBIC ONLY Blood Culture results may not be optimal due to an inadequate volume of blood received in culture bottles   CULT  09/26/2019 0345    NO GROWTH 1 DAY Performed at [facility], [place] [address], [place], [place] [address]    REPTSTATUS PENDING 10/07/2019 0345   Medications: . sodium chloride    . sodium chloride    . sodium chloride    . sodium chloride    . magnesium sulfate bolus IVPB     . sodium chloride   Intravenous Once  . sodium chloride   Intravenous Once  . amLODipine  10 mg Oral QHS  . atorvastatin  20 mg Oral q1800  . citalopram  10 mg Oral Daily  . cloNIDine  0.1 mg Oral BID  . docusate sodium  100 mg Oral Daily  . heparin injection (subcutaneous)  5,000 Units Subcutaneous Q8H  . hydrALAZINE  100 mg Oral TID  . labetalol  600 mg Oral BID  . losartan  100 mg Oral Daily  . multivitamin  1 tablet Oral QHS  . mupirocin ointment  1 application Nasal BID  . pantoprazole  40 mg Oral Daily  . pregabalin  50 mg Oral BID  . sodium chloride flush  3 mL Intravenous Q12H  . spironolactone  25 mg Oral Daily  . witch hazel-glycerin   Topical TID AC & HS    Dialysis Orders: TTS at [facility] 4:15hr, 400/A1.5, EDW 74kg, 2K/2Ca, TDC, no heparin - Getting course of IV venofer - Mircera 145mcg Iv q 2 weeks  Assessment/Plan: 1.Chest pain -> Symptomatic Pseudoaneurysm with Type 1 endoleak: S/p extensive vascular repair 12/5 by Dr. Scot Dock (L carotid subclavian bypass, endovascular repair thoracic aortic pseudoaneurysm, coil embolization L subclavian artery). Per vascular surgery. 2. End-stage renal disease: Continue HD per usual TTS sched - next tomorrow. 3. Anemia of ESRD with acute blood loss secondary to endoleak: Hgb 9 today. 4U PRBCs 12/5. 4. Hypertension:BP decent today - ^ labetalol dose on 12/6. 5. Secondary hyperparathyroidism:CorrCa and Phos slightly high - not on binders or VDRA as outpatient. Repeat labs in AM. If still ^, will plan to start binder. 6. Nutrition: Alb very low - start pro-stat supplements.  Veneta Penton, PA-C 09/29/2019, 11:09 AM  [facility] Pager: [phone]  Pt seen, examined and  agree w A/P as above.  [name]  MD 09/29/2019, 1:43 PM

## 2019-09-29 NOTE — Progress Notes (Addendum)
  Progress Note    09/29/2019 7:25 AM 2 Days Post-Op  Subjective:  Says his chest pain is gone  Tm 100.2  HR [id number] NSR [id number] systolic [id number] RA   Vitals:   09/29/19 0009 09/29/19 0424  BP: 127/68 119/68  Pulse: 87 87  Resp: 13 19  Temp: 99 F (37.2 C) 100.2 F (37.9 C)  SpO2: 98% 99%    Physical Exam: Cardiac:  regular Lungs:  Non labored Incisions:  Left neck and right groin incisions are clean and dry without hematoma.  Left groin soft without hematoma. Extremities:  radial/ulnar pulses not palpable, but monophasic left radial and brisk left ulnar pulses present.  Motor and sensation are in tact.  Abdomen:  Soft, NT/ND  CBC    Component Value Date/Time   WBC 11.2 (H) 09/28/2019 0531   RBC 3.42 (L) 09/28/2019 0531   HGB 9.0 (L) 09/28/2019 0531   HCT 27.7 (L) 09/28/2019 0531   PLT 272 09/28/2019 0531   MCV 81.0 09/28/2019 0531   MCH 26.3 09/28/2019 0531   MCHC 32.5 09/28/2019 0531   RDW 18.6 (H) 09/28/2019 0531   LYMPHSABS 0.8 10/12/2019 2005   MONOABS 1.4 (H) 10/05/2019 2005   EOSABS 0.0 10/07/2019 2005   BASOSABS 0.0 10/20/2019 2005    BMET    Component Value Date/Time   NA 134 (L) 09/28/2019 0531   K 3.8 09/28/2019 0531   CL 96 (L) 09/28/2019 0531   CO2 27 09/28/2019 0531   GLUCOSE 95 09/28/2019 0531   BUN 17 09/28/2019 0531   CREATININE 3.63 (H) 09/28/2019 0531   CALCIUM 8.3 (L) 09/28/2019 0531   GFRNONAA 17 (L) 09/28/2019 0531   GFRAA 20 (L) 09/28/2019 0531    INR    Component Value Date/Time   INR 1.4 (H) 10/06/2019 0345     Intake/Output Summary (Last 24 hours) at 09/29/2019 0725 Last data filed at 09/29/2019 0424 Gross per 24 hour  Intake 640 ml  Output -  Net 640 ml     Assessment:  58 y.o. male is s/p:  1.  Placement of left femoral double-lumen catheter 2.  Left carotid subclavian bypass 3.  Right common femoral artery exposure 4.  Nonselective catheter placement in ascending aorta 5.  Endovascular repair of thoracic aortic  pseudoaneurysm 6.  Coil embolization of left subclavian artery with 4 x 12 mm Nester coils  2 Days Post-Op  Plan: -pt doing well this morning.  His CP has resolved.  -radial/ulnar pulses not palpable, but monophasic left radial and brisk left ulnar pulses present.  Motor and sensation are in tact.  -pt for CTA chest today and plan for discharge tomorrow after dialysis per Dr. Nicole Cella note yesterday.    Leontine Locket, PA-C [facility] [phone] 09/29/2019 7:25 AM  Baseline paraplegia from initial repair.  Uses wheelchair to get around.  2+ left brachial pulse, no radial or ulnar pulse.  Left hand warm no numbness tingling or motor deficit.  Left neck incision clean. Groin incision not viewed since pt eating breakfast.  Plan as above. CTA chest today. D/c home tomorrow.  Ruta Hinds, MD [facility] Office: [phone]

## 2019-09-29 NOTE — Progress Notes (Signed)
Spoke with [place] regarding IV needed for scan. IV in upper arm was found to be leaking with flush when patient was in radiology.   Currently only site that will accommodate a 20 gauge catheter is below the leaking IV in the same vein.  Per [place], if upper arm catheter does not leak when new site is placed then new IV will be able to be used for scan.

## 2019-09-29 NOTE — Progress Notes (Addendum)
Initial Nutrition Assessment  DOCUMENTATION CODES:   Severe malnutrition in context of acute illness/injury  INTERVENTION:   -Ensure Enlive po BID, each supplement provides 350 kcal and 20 grams of protein -Continue Rena-Vit daily   NUTRITION DIAGNOSIS:   Severe Malnutrition related to acute illness(aoritic dissection requiring multiple surgeries and hospital stays) as evidenced by moderate fat depletion, moderate muscle depletion.  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  Patient was sitting up asleep in bed. Patient had recently received pain medications and was a poor historian. Patient reported he typically eats a breakfast of sausage, eggs and grits, a lunch of chicken and rice and dinner of broccoli, meatloaf and rice. Snacks on oatmeal cookies. Drinks 2 vanilla Ensure per day at home. Patient reported on dialysis days he does not eat breakfast but eats lunch and dinner when he returns home.  Baseline paraplegia uses wheelchair.   Patient meets clinical characteristics of malnutrition in the context of acute illness with possible chronic malnutrition. Noted phosphorus elevated currently not on phosphorus binders, on low phos diet will monitor trend.  Patient is unsure of usual EDW but feels as though he has lost 20# since first surgery. Charted EDW is 74 kg with current wt is 76.3kg. Patient is eating 75-100% of meals.   Medications: 0.9% NaCl, Colace, heparin, rena-vit, aldactone, magnesium sulfate IVPB 2g 50 ml  Labs: Sodium 134, calcium 8.3,  corrected calcium 10.3, Phosphorus 6.4  NUTRITION - FOCUSED PHYSICAL EXAM:    Most Recent Value  Orbital Region  Moderate depletion  Upper Arm Region  Severe depletion  Thoracic and Lumbar Region  Severe depletion  Buccal Region  Moderate depletion  Temple Region  Moderate depletion  Clavicle Bone Region  Moderate depletion  Clavicle and Acromion Bone Region  Mild depletion  Scapular Bone Region  Mild depletion  Dorsal Hand  Moderate depletion  Patellar Region  Moderate depletion  Anterior Thigh Region  Moderate depletion  Posterior Calf Region  Moderate depletion  Edema (RD Assessment)  None  Hair  Unable to assess  Eyes  Unable to assess  Mouth  Reviewed  Skin  Reviewed  Nails  Reviewed       Diet Order:   Diet Order            Diet renal with fluid restriction Fluid restriction: 1200 mL Fluid; Room service appropriate? Yes; Fluid consistency: Thin  Diet effective now              EDUCATION NEEDS:   Not appropriate for education at this time  Skin:  Skin Assessment: Reviewed RN Assessment  Last BM:  12/6  Height:   Ht Readings from Last 1 Encounters:  06/27/19 6\' 4"  (1.93 m)    Weight:   Wt Readings from Last 1 Encounters:  09/28/19 76.3 kg    Ideal Body Weight:  91.8 kg  BMI:  Body mass index is 20.48 kg/m.  Estimated Nutritional Needs:   Kcal:  [id number]  Protein:  115-125  Fluid:  1033ml plus [place]  Intern Pager # [phone]

## 2019-09-29 NOTE — Plan of Care (Signed)
Continue to monitor

## 2019-09-30 ENCOUNTER — Inpatient Hospital Stay (HOSPITAL_COMMUNITY): Payer: Medicare Other

## 2019-09-30 ENCOUNTER — Inpatient Hospital Stay (HOSPITAL_COMMUNITY): Payer: Medicare Other | Admitting: Certified Registered Nurse Anesthetist

## 2019-09-30 DIAGNOSIS — E43 Unspecified severe protein-calorie malnutrition: Secondary | ICD-10-CM | POA: Insufficient documentation

## 2019-09-30 DIAGNOSIS — J9601 Acute respiratory failure with hypoxia: Secondary | ICD-10-CM | POA: Diagnosis not present

## 2019-09-30 LAB — BPAM RBC
Blood Product Expiration Date: 202101082359
Blood Product Expiration Date: 202101082359
Blood Product Expiration Date: 202101082359
Blood Product Expiration Date: 202101092359
Blood Product Expiration Date: 202101092359
Blood Product Expiration Date: 202101102359
Blood Product Expiration Date: 202101102359
Blood Product Expiration Date: 202101102359
ISSUE DATE / TIME: 202012050747
ISSUE DATE / TIME: 202012050840
ISSUE DATE / TIME: 202012051056
ISSUE DATE / TIME: 202012051056
ISSUE DATE / TIME: 202012051118
ISSUE DATE / TIME: 202012051118
Unit Type and Rh: 5100
Unit Type and Rh: 5100
Unit Type and Rh: 5100
Unit Type and Rh: 5100
Unit Type and Rh: 5100
Unit Type and Rh: 5100
Unit Type and Rh: 5100
Unit Type and Rh: 5100

## 2019-09-30 LAB — BASIC METABOLIC PANEL
Anion gap: 16 — ABNORMAL HIGH (ref 5–15)
BUN: 49 mg/dL — ABNORMAL HIGH (ref 6–20)
CO2: 22 mmol/L (ref 22–32)
Calcium: 8.9 mg/dL (ref 8.9–10.3)
Chloride: 94 mmol/L — ABNORMAL LOW (ref 98–111)
Creatinine, Ser: 7.44 mg/dL — ABNORMAL HIGH (ref 0.61–1.24)
GFR calc Af Amer: 8 mL/min — ABNORMAL LOW (ref >60)
GFR calc non Af Amer: 7 mL/min — ABNORMAL LOW (ref >60)
Glucose, Bld: 99 mg/dL (ref 70–99)
Potassium: 4.9 mmol/L (ref 3.5–5.1)
Sodium: 132 mmol/L — ABNORMAL LOW (ref 135–145)

## 2019-09-30 LAB — TYPE AND SCREEN
ABO/RH(D): O POS
Antibody Screen: NEGATIVE
Unit division: 0
Unit division: 0
Unit division: 0
Unit division: 0
Unit division: 0
Unit division: 0
Unit division: 0
Unit division: 0

## 2019-09-30 LAB — CBC
HCT: 23.1 % — ABNORMAL LOW (ref 39.0–52.0)
Hemoglobin: 7.5 g/dL — ABNORMAL LOW (ref 13.0–17.0)
MCH: 26 pg (ref 26.0–34.0)
MCHC: 32.5 g/dL (ref 30.0–36.0)
MCV: 80.2 fL (ref 80.0–100.0)
Platelets: 242 10*3/uL (ref 150–400)
RBC: 2.88 MIL/uL — ABNORMAL LOW (ref 4.22–5.81)
RDW: 18.6 % — ABNORMAL HIGH (ref 11.5–15.5)
WBC: 13.3 10*3/uL — ABNORMAL HIGH (ref 4.0–10.5)
nRBC: 0 % (ref 0.0–0.2)

## 2019-09-30 LAB — BLOOD GAS, ARTERIAL
Acid-base deficit: 0.3 mmol/L (ref 0.0–2.0)
Bicarbonate: 23.5 mmol/L (ref 20.0–28.0)
FIO2: 28
O2 Saturation: 95.1 %
Patient temperature: 38.2
pCO2 arterial: 38.6 mmHg (ref 32.0–48.0)
pH, Arterial: 7.408 (ref 7.350–7.450)
pO2, Arterial: 82.5 mmHg — ABNORMAL LOW (ref 83.0–108.0)

## 2019-09-30 LAB — LACTIC ACID, PLASMA
Lactic Acid, Venous: 1.2 mmol/L (ref 0.5–1.9)
Lactic Acid, Venous: 1 mmol/L (ref 0.5–1.9)

## 2019-09-30 LAB — GLUCOSE, CAPILLARY: Glucose-Capillary: 109 mg/dL — ABNORMAL HIGH (ref 70–99)

## 2019-09-30 MED ORDER — ALBUMIN HUMAN 5 % IV SOLN
INTRAVENOUS | Status: AC
  Filled 2019-09-30: qty 500

## 2019-09-30 MED ORDER — VANCOMYCIN HCL 10 G IV SOLR
1500.0000 mg | Freq: Once | INTRAVENOUS | Status: AC
  Administered 2019-09-30: 1500 mg via INTRAVENOUS
  Filled 2019-09-30: qty 1500

## 2019-09-30 MED ORDER — NALOXONE HCL 0.4 MG/ML IJ SOLN
INTRAMUSCULAR | Status: AC
  Administered 2019-09-30: 0.4 mg via INTRAVENOUS
  Filled 2019-09-30: qty 1

## 2019-09-30 MED ORDER — NALOXONE HCL 0.4 MG/ML IJ SOLN
0.4000 mg | INTRAMUSCULAR | Status: DC | PRN
  Administered 2019-09-30: 01:00:00 0.4 mg via INTRAVENOUS

## 2019-09-30 MED ORDER — DARBEPOETIN ALFA 200 MCG/0.4ML IJ SOSY: 200.0000 ug | PREFILLED_SYRINGE | INTRAMUSCULAR | Status: DC

## 2019-09-30 MED ORDER — DARBEPOETIN ALFA 200 MCG/0.4ML IJ SOSY
200.0000 ug | PREFILLED_SYRINGE | INTRAMUSCULAR | Status: DC
  Filled 2019-09-30: qty 0.4

## 2019-09-30 MED ORDER — SODIUM CHLORIDE 0.9 % IV BOLUS
500.0000 mL | Freq: Once | INTRAVENOUS | Status: AC
  Administered 2019-09-30: 500 mL via INTRAVENOUS

## 2019-09-30 MED ORDER — PIPERACILLIN-TAZOBACTAM 3.375 G IVPB
3.3750 g | Freq: Two times a day (BID) | INTRAVENOUS | Status: DC
  Administered 2019-09-30: 3.375 g via INTRAVENOUS
  Filled 2019-09-30: qty 50

## 2019-10-01 LAB — CULTURE, BLOOD (ROUTINE X 2): Culture: NO GROWTH

## 2019-10-01 MED FILL — Medication: Qty: 1 | Status: AC

## 2019-10-02 LAB — CULTURE, BLOOD (ROUTINE X 2): Culture: NO GROWTH

## 2019-10-02 LAB — SURGICAL PATHOLOGY

## 2019-10-07 ENCOUNTER — Telehealth: Payer: Self-pay | Admitting: Pulmonary Disease

## 2019-10-07 NOTE — Telephone Encounter (Signed)
Spoke to [place] with [employer] and made him aware that death certificate should go to Fatima Sanger, MD.  Nothing further is needed.
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
## 2019-10-08 ENCOUNTER — Telehealth: Payer: Self-pay | Admitting: *Deleted

## 2019-10-08 NOTE — Telephone Encounter (Signed)
Received original D/C from [facility]-D/C Forwarded to Dr.Wert for Signature.

## 2019-10-09 ENCOUNTER — Telehealth: Payer: Self-pay

## 2019-10-09 NOTE — Telephone Encounter (Signed)
Received a phone call from Doctor Halford Chessman indicating that the dc needs to go to [facility].  I called the funeral home to let them know this information and they are going to come by and pickup the original dc.

## 2019-10-24 NOTE — Evaluation (Signed)
Occupational Therapy Evaluation Patient Details Name: Frank Fuller MRN: [id number] DOB: 14-May-1961 Today's Date: 10-20-2019    History of Present Illness Patient is a 59 y/o male who presents as tx from [facility] with chest/back pain with thoracic aortic aneurysm s/p thoracic stent graft. CT-pseudoaneurysm related to a leak from the top of the stent graft. s/p left carotid subclavian bypass/endovascular repair of thoracic aortic pseudoaneurysm 12/5. PMH includes HTN. ESRD on HD (TTS), AAA s/p stent, Hep C, depression, CVA.   Clinical Impression   PTA patient reports independent with ADLs, mobility using mainly w/c (some RW using) and transferring independently. Admitted for above and limited by problem list below, including generalized weakness, decreased activity tolerance, lethargy, impaired cognition, and impaired balance.  He currently requires mod assist for bed mobility, min guard for sitting balance EOB, and min to total assist for ADLs.  Limited eval to EOB due to generalized pain, lethargy, and fatigue.  Able to follow 1 step commands with increased time, slow processing and min assist for problem solving; he is initially alert and oriented, but noted some confusion asking if it was January at the end of the session.  VSS on 2L supplemental oxygen via . Will follow acutely and believe he will best benefit from intensive CIR level rehab in order to optimize independence and safety with ADLs, mobility prior to dc home.     Follow Up Recommendations  CIR;Supervision/Assistance - 24 hour    Equipment Recommendations  None recommended by OT    Recommendations for Other Services       Precautions / Restrictions Precautions Precautions: Fall Restrictions Weight Bearing Restrictions: No      Mobility Bed Mobility Overal bed mobility: Needs Assistance Bed Mobility: Sit to Supine;Supine to Sit     Supine to sit: HOB elevated;Mod assist Sit to supine: Mod assist;HOB elevated    General bed mobility comments: Using BUEs to bring LEs to EOB, assist with trunk and to scoot bottom to EOB.  Transfers                 General transfer comment: deferred due to lethargy    Balance Overall balance assessment: Needs assistance Sitting-balance support: Feet supported;No upper extremity supported Sitting balance-Leahy Scale: Fair Sitting balance - Comments: min guard to sit EOB, limited sustained upright position due to fatigue, lethargy and weakness                                   ADL either performed or assessed with clinical judgement   ADL Overall ADL's : Needs assistance/impaired     Grooming: Minimal assistance;Sitting   Upper Body Bathing: Minimal assistance;Sitting   Lower Body Bathing: Moderate assistance;Bed level             Toilet Transfer Details (indicate cue type and reason): deferred due to lethargy         Functional mobility during ADLs: Moderate assistance(limited to EOB) General ADL Comments: pt limited by weakness, confusion, impaired balance, and decreased activity tolerance     Vision   Vision Assessment?: No apparent visual deficits     Perception     Praxis      Pertinent Vitals/Pain Pain Assessment: Faces Faces Pain Scale: Hurts little more Pain Location: everywhere Pain Descriptors / Indicators: Aching;Sore Pain Intervention(s): Monitored during session;Repositioned;Limited activity within patient's tolerance     Hand Dominance Right   Extremity/Trunk Assessment Upper Extremity Assessment Upper  Extremity Assessment: Generalized weakness   Lower Extremity Assessment Lower Extremity Assessment: Defer to PT evaluation RLE Deficits / Details: Grossly ~2/5 knee extension, difficulty with hip abduction using UEs to bring RLE to EOB. RLE Sensation: decreased light touch LLE Deficits / Details: Grossly ~2/5 knee extension, difficulty with hip adduction using UEs to bring LLE to EOB. LLE  Sensation: decreased light touch       Communication Communication Communication: Other (comment)(low raspy voice)   Cognition Arousal/Alertness: Lethargic Behavior During Therapy: WFL for tasks assessed/performed Overall Cognitive Status: Impaired/Different from baseline Area of Impairment: Orientation;Attention;Awareness;Problem solving;Memory;Following commands                 Orientation Level: Disoriented to;Time Current Attention Level: Sustained Memory: Decreased short-term memory Following Commands: Follows one step commands with increased time(and repitition)   Awareness: Intellectual Problem Solving: Slow processing;Requires verbal cues;Decreased initiation General Comments: initally A&Ox4, then at end of session asking if it was the 51 or 22nd of january.  Lethargy in and out of session.   General Comments  VSS    Exercises     Shoulder Instructions      Home Living Family/patient expects to be discharged to:: Private residence Living Arrangements: Alone   Type of Home: House Home Access: Stairs to enter;Ramped entrance Entrance Stairs-Number of Steps: 3   Home Layout: One level     Bathroom Shower/Tub: Occupational psychologist: Standard     Home Equipment: Grab bars - tub/shower;Shower seat;Hand held Tourist information centre manager - 4 wheels;Wheelchair - manual;Bedside commode          Prior Functioning/Environment Level of Independence: Needs assistance  Gait / Transfers Assistance Needed: uses rollator vs w/c- more so the w/c. Transfers on his own ADL's / Homemaking Assistance Needed: Does own ADLs, IADLs            OT Problem List: Decreased strength;Impaired balance (sitting and/or standing);Decreased activity tolerance;Decreased cognition;Decreased safety awareness;Cardiopulmonary status limiting activity;Pain      OT Treatment/Interventions: Self-care/ADL training;Energy conservation;DME and/or AE instruction;Therapeutic  activities;Patient/family education;Balance training;Cognitive remediation/compensation    OT Goals(Current goals can be found in the care plan section) Acute Rehab OT Goals Patient Stated Goal: none stated  OT Frequency: Min 2X/week   Barriers to D/C: Decreased caregiver support          Co-evaluation PT/OT/SLP Co-Evaluation/Treatment: Yes Reason for Co-Treatment: Necessary to address cognition/behavior during functional activity;For patient/therapist safety PT goals addressed during session: Mobility/safety with mobility OT goals addressed during session: ADL's and self-care      AM-PAC OT "6 Clicks" Daily Activity     Outcome Measure Help from another person eating meals?: A Little Help from another person taking care of personal grooming?: A Little Help from another person toileting, which includes using toliet, bedpan, or urinal?: Total Help from another person bathing (including washing, rinsing, drying)?: A Lot Help from another person to put on and taking off regular upper body clothing?: A Lot Help from another person to put on and taking off regular lower body clothing?: Total 6 Click Score: 12   End of Session Equipment Utilized During Treatment: Oxygen Nurse Communication: Mobility status  Activity Tolerance: Patient limited by lethargy Patient left: in bed;with call bell/phone within reach;with bed alarm set  OT Visit Diagnosis: Pain;Muscle weakness (generalized) (M62.81);Other symptoms and signs involving cognitive function Pain - part of body: (generalized)                Time: [id number] OT Time Calculation (  min): 16 min Charges:  OT General Charges $OT Visit: 1 Visit OT Evaluation $OT Eval Moderate Complexity: [place], [place] Acute Rehabilitation Services Pager [phone] Office [phone]   Delight Stare 06-Oct-2019, 12:54 PM

## 2019-10-24 NOTE — Progress Notes (Signed)
Attempted to reach Jayme Cloud, mother multiple times for funeral home placement. No answer and unable to leave a voicemail.

## 2019-10-24 NOTE — Progress Notes (Signed)
Pharmacy Antibiotic Note  Iliya Josephson is a 59 y.o. male admitted on 10/19/2019 for repair thoracic pseudoaneurysm.  Sppiked fever overnight, Tm 100.8, WBC elevated 13, hypotensive, ESRD.   Pharmacy has been consulted for vancomycin and zosyn dosing.  Plan: Vancomycin 1500mg  x1 then follow up HD schedule to redose after HD Zosyn 3.375gm IV q12h - EI  Weight: 168 lb 3.4 oz (76.3 kg)  Temp (24hrs), Avg:99.6 F (37.6 C), Min:98.5 F (36.9 C), Max:100.8 F (38.2 C)  Recent Labs  Lab 10/22/2019 2005 09/29/2019 2015 10/10/2019 0332 09/25/2019 0345 10/04/2019 1216 10/23/2019 1503 09/28/19 0531 27-Oct-2019 0635  WBC 15.7*  --   --  12.8*  --  13.4* 11.2* 13.3*  CREATININE 6.24*  --   --   --  6.40* 6.76* 3.63* 7.44*  LATICACIDVEN  --  1.4 2.0*  --   --   --   --  1.2    Estimated Creatinine Clearance: 11.4 mL/min (A) (by C-G formula based on SCr of 7.44 mg/dL (H)).    Allergies  Allergen Reactions  . Oxycodone Nausea Only  . Pramoxine     Other reaction(s): Skin Rash  . Chlorhexidine Other (See Comments)    Unknown reaction Patch skin test done at dialysis 06/26/17  - staff using clear dressing and alcohol to clean exit site of catheter  . Clonidine Derivatives Other (See Comments)    Dizziness   . Fluorouracil     Other reaction(s): Skin Rash  . Lisinopril Other (See Comments)    unresponsive  . Sensipar  [Cinacalcet Hcl]     Other reaction(s): agitation  . Carvedilol Rash    Antimicrobials this admission:   Dose adjustments this admission:   Microbiology results: 12/8 BCx   Bonnita Nasuti Pharm.D. CPP, BCPS Clinical Pharmacist [phone] 10-27-19 9:14 AM

## 2019-10-24 NOTE — Progress Notes (Signed)
Patients sleepy . Can't keep his eyes open. Resp using abdo. Muscles. Still has a lot of congestion in his throat and to sleepy to cough it up. Tried I.S. with poor effort. V.S. stable Stephanie R.N. aware cont. To monitor patient.

## 2019-10-24 NOTE — Progress Notes (Signed)
Called mother Daishon Hedman to inform her that Faysal had died.  Spoke with additional family and agreed to call back later to discuss further.

## 2019-10-24 NOTE — Progress Notes (Signed)
Updated Mindy R.N. w/ [facility]. On patient V.S. and patient remains sleepy but awakens alert and oriented then goes back to sleep. 2 Tylenol p.o. given for Temp. 100.8. Still has congestion in back of throat will not cough and get it up.

## 2019-10-24 NOTE — Progress Notes (Signed)
CODE BLUE NOTE  Patient Name: Frank Fuller   MRN: [id number]   Date of Birth/ Sex: 1961-09-01 , male      Admission Date: 09/25/2019  Attending Provider: Angelia Mould, *  Primary Diagnosis: Pseudoaneurysm of aorta [facility]) [I71.9]    Indication: Pt was in his usual state of health until this PM, when he was noted to have eyes rolling back, sats 60s and HR 50s, then found to have blood and blood clots coming out from his mouth suddenly. Code blue was subsequently called. At the time of arrival on scene, ACLS protocol was underway.   Technical Description:  - CPR performance duration:  39 minutes  - Was defibrillation or cardioversion used? No   - Was external pacer placed? No  - Was patient intubated pre/post CPR? Yes    Medications Administered: Y = Yes; Blank = No Amiodarone    Atropine    Calcium    Epinephrine  y  Lidocaine    Magnesium    Norepinephrine    Phenylephrine    Sodium bicarbonate  y  Vasopressin    Other     Post CPR evaluation:  - Final Status - Was patient successfully resuscitated ? No   Miscellaneous Information:  - Time of death:  20.24  - Primary team notified?  Yes, Vascular   - Family Notified? Yes    Dr Patsey Berthold from PCCM was present at code. Used bronchoscope to visualize airway which was present with extensive hemmorage and clots. See below of clot images.           Lattie Haw, MD   10-15-2019, 8:33 PM

## 2019-10-24 NOTE — Progress Notes (Signed)
The chaplain responded to a Code Blue and spoke with the family over the phone.  The chaplain is available if further assistance is needed.  Brion Aliment Chaplain Resident For questions concerning this note please contact me by pager [phone]

## 2019-10-24 NOTE — Progress Notes (Signed)
Chaplain spoke with nurse about patient's current state, and nurse stated that patient would not be able to complete an Advanced Directive at this time because he is very lethargic.  Chaplain will close consult for AD, but continue to check with patient status.

## 2019-10-24 NOTE — Progress Notes (Signed)
Rapid resp. And Charge R.N. aware of recent events. Awaiting lab and cxr results

## 2019-10-24 NOTE — Progress Notes (Signed)
Report called to [place], RN on [place]. Will transfer when bed available.

## 2019-10-24 NOTE — Progress Notes (Signed)
Patient belongings including black pants, black tobaggon, grey sweatshirt, pink towel, dentures, ring and black shoes placed in patient belongings back and sent to the morgue with the patient.

## 2019-10-24 NOTE — Progress Notes (Signed)
Rehab Admissions Coordinator Note:  Per PT and OT recommendation, this patient was screened by Raechel Ache for appropriateness for an Inpatient Acute Rehab Consult.    Will follow along for more progress and tolerance with therapies prior to requesting consult order.   Raechel Ache 10-23-2019, 1:05 PM  I can be reached at [phone].

## 2019-10-24 NOTE — Progress Notes (Signed)
Informed floor Rn [employer] HD tx rescheduled to 10/01/19 per Dr.Schertz.

## 2019-10-24 NOTE — Progress Notes (Signed)
Patient cont. To be sleepy and tries to follow commands but falls back to sleep. Called rapid resp. To come a assess patient.[place] R.N. here and assess patient. See orders for Narcan 0.4 mg I.V. given by Mindy R.N. also did C.B.G.sugar 109. Patient did more awake and cough up a bright red thick blood mucus. Dr. Donzetta Matters was called and made aware.See orders to [facility]. Dilaudid and give only P.O. pain meds.

## 2019-10-24 NOTE — Evaluation (Signed)
Physical Therapy Evaluation Patient Details Name: Frank Fuller MRN: [id number] DOB: 06-02-1961 Today's Date: 10-02-2019   History of Present Illness  Patient is a 59 y/o male who presents as tx from [place] with chest/back pain with thoracic aortic aneurysm s/p thoracic stent graft. CT-pseudoaneurysm related to a leak from the top of the stent graft. s/p left carotid subclavian bypass/endovascular repair of thoracic aortic pseudoaneurysm 12/5. PMH includes HTN. ESRD on HD (TTS), AAA s/p stent, Hep C, depression, CVA.  Clinical Impression  Patient presents with generalized weakness (hx of paraplegia), lethargy, impaired cognition, decreased activity tolerance and impaired mobility s/p above. Pt reports living alone and being Mod I PTA mostly using w/c for mobility but walking some with RW (not sure of accuracy). Today, pt required Mod A for bed mobility and able to sit EOB with close min guard for safety. Noted to have confusion during session, initially knowing month/year and then asking if it was January at end of session. VSS throughout with pt on 2L/min 02 [place]. Would benefit from CIR to maximize independence and mobility prior to return home. Will follow acutely.    Follow Up Recommendations CIR    Equipment Recommendations  Other (comment)(TBA)    Recommendations for Other Services Rehab consult     Precautions / Restrictions Precautions Precautions: Fall Restrictions Weight Bearing Restrictions: No      Mobility  Bed Mobility Overal bed mobility: Needs Assistance Bed Mobility: Sit to Supine     Supine to sit: HOB elevated;Mod assist Sit to supine: Mod assist;HOB elevated   General bed mobility comments: Using BUEs to bring LEs to EOB, assist with trunk and to scoot bottom to EOB.  Transfers                 General transfer comment: Deferred due to arousal and safety concerns.  Ambulation/Gait                Stairs            Wheelchair Mobility     Modified Rankin (Stroke Patients Only)       Balance Overall balance assessment: Needs assistance Sitting-balance support: Feet supported;No upper extremity supported Sitting balance-Leahy Scale: Fair Sitting balance - Comments: Min guard to sit EOB, posterior pelvic tilt. Able to sit upright and facilitate trunk extension however not sustain due to fatigue/lethargy/weakness.                                     Pertinent Vitals/Pain Pain Assessment: Faces Faces Pain Scale: Hurts little more Pain Location: everywhere Pain Descriptors / Indicators: Aching;Sore Pain Intervention(s): Monitored during session;Repositioned    Home Living Family/patient expects to be discharged to:: Private residence Living Arrangements: Alone   Type of Home: House Home Access: Stairs to enter;Ramped entrance   Entrance Stairs-Number of Steps: 3 Home Layout: One level Home Equipment: Grab bars - tub/shower;Shower seat;Hand held Tourist information centre manager - 4 wheels;Wheelchair - manual;Bedside commode      Prior Function Level of Independence: Needs assistance   Gait / Transfers Assistance Needed: uses rollator vs w/c- more so the w/c. Transfers on his own  ADL's / Homemaking Assistance Needed: Does own ADLs.        Hand Dominance   Dominant Hand: Right    Extremity/Trunk Assessment   Upper Extremity Assessment Upper Extremity Assessment: Defer to OT evaluation    Lower Extremity Assessment Lower Extremity Assessment: Generalized  weakness;RLE deficits/detail;LLE deficits/detail;Difficult to assess due to impaired cognition RLE Deficits / Details: Grossly ~2/5 knee extension, difficulty with hip abduction using UEs to bring RLE to EOB. RLE Sensation: decreased light touch LLE Deficits / Details: Grossly ~2/5 knee extension, difficulty with hip adduction using UEs to bring LLE to EOB. LLE Sensation: decreased light touch       Communication   Communication: Other  (comment)(low raspy voice, sounds wet)  Cognition Arousal/Alertness: Lethargic Behavior During Therapy: WFL for tasks assessed/performed Overall Cognitive Status: Impaired/Different from baseline Area of Impairment: Orientation;Attention;Awareness;Problem solving;Memory;Following commands                 Orientation Level: Disoriented to;Time Current Attention Level: Sustained Memory: Decreased short-term memory Following Commands: (and repetition)   Awareness: Intellectual Problem Solving: Slow processing;Requires verbal cues General Comments: A&ox4 initially however at end of session, pt asking if it is the 21st or 22nd of january. Lethargy going in and out during session.      General Comments General comments (skin integrity, edema, etc.): VSS.    Exercises     Assessment/Plan    PT Assessment Patient needs continued PT services  PT Problem List Decreased strength;Decreased mobility;Decreased range of motion;Decreased activity tolerance;Decreased cognition;Cardiopulmonary status limiting activity;Pain;Decreased balance;Decreased knowledge of use of DME       PT Treatment Interventions Therapeutic activities;Gait training;Therapeutic exercise;Patient/family education;Wheelchair mobility training;Cognitive remediation;Balance training;Functional mobility training;Neuromuscular re-education;DME instruction    PT Goals (Current goals can be found in the Care Plan section)  Acute Rehab PT Goals Patient Stated Goal: none stated PT Goal Formulation: With patient Time For Goal Achievement: 10/14/19 Potential to Achieve Goals: Fair    Frequency Min 3X/week   Barriers to discharge Decreased caregiver support lives alone    Co-evaluation PT/OT/SLP Co-Evaluation/Treatment: Yes Reason for Co-Treatment: Necessary to address cognition/behavior during functional activity;For patient/therapist safety PT goals addressed during session: Mobility/safety with mobility          AM-PAC PT "6 Clicks" Mobility  Outcome Measure Help needed turning from your back to your side while in a flat bed without using bedrails?: A Little Help needed moving from lying on your back to sitting on the side of a flat bed without using bedrails?: A Lot Help needed moving to and from a bed to a chair (including a wheelchair)?: A Lot Help needed standing up from a chair using your arms (e.g., wheelchair or bedside chair)?: A Lot Help needed to walk in hospital room?: A Lot Help needed climbing 3-5 steps with a railing? : Total 6 Click Score: 12    End of Session Equipment Utilized During Treatment: Oxygen Activity Tolerance: Patient limited by lethargy Patient left: in bed;with call bell/phone within reach;with bed alarm set Nurse Communication: Mobility status PT Visit Diagnosis: Pain;Muscle weakness (generalized) (M62.81);Difficulty in walking, not elsewhere classified (R26.2) Pain - part of body: ("everywhere")    Time: 1023-1040 PT Time Calculation (min) (ACUTE ONLY): 17 min   Charges:   PT Evaluation $PT Eval Moderate Complexity: 1 Mod          Frank Fuller, PT, DPT Acute Rehabilitation Services Pager [phone] Office [phone]      Frank Fuller 10/16/2019, 12:40 PM

## 2019-10-24 NOTE — Progress Notes (Addendum)
[facility] Progress Note   Subjective:  Transferred to [place] this morning with lethargy, hypotension, confusion. Had spiked fever overnight - cultured and started on Vanc/Zosyn. Seen in room - resting quietly, NAD. He is on schedule for dialysis today.  Objective Vitals:   2019/10/28 1100 10/28/19 1200 28-Oct-2019 1300 Oct 28, 2019 1400  BP: 112/74 118/78 114/79 110/72  Pulse: 80 79 78 79  Resp: (!) 21 17 17 17   Temp: 97.7 F (36.5 C)     TempSrc:      SpO2: 100% 100% 94% 98%  Weight:      Height:       Physical Exam General: Resting quietly, slightly rouseable - did not verbally answer my questions Heart: RRR; 2/6 murmur Lungs: CTA anteriorly Abdomen: soft, non-tender Extremities: No LE edema Dialysis Access: [facility]  Additional Objective Labs: Basic Metabolic Panel: Recent Labs  Lab 09/26/2019 1503 09/28/19 0531 28-Oct-2019 0635  NA 131* 134* 132*  K 5.6* 3.8 4.9  CL 96* 96* 94*  CO2 24 27 22   GLUCOSE 120* 95 99  BUN 38* 17 49*  CREATININE 6.76* 3.63* 7.44*  CALCIUM 8.6* 8.3* 8.9  PHOS 6.4*  --   --    Liver Function Tests: Recent Labs  Lab 10/20/2019 2005 09/24/2019 1503  AST 19  --   ALT 11  --   ALKPHOS 101  --   BILITOT 0.8  --   PROT 6.5  --   ALBUMIN 1.9* 1.9*   CBC: Recent Labs  Lab 10/11/2019 2005 09/23/2019 0345  10/03/2019 1503 09/28/19 0531 October 28, 2019 0635  WBC 15.7* 12.8*  --  13.4* 11.2* 13.3*  NEUTROABS 13.3*  --   --   --   --   --   HGB 7.6* 6.7*   < > 8.3* 9.0* 7.5*  HCT 24.3* 21.2*   < > 26.1* 27.7* 23.1*  MCV 83.8 82.5  --  80.8 81.0 80.2  PLT 323 289  --  250 272 242   < > = values in this interval not displayed.   Blood Culture    Component Value Date/Time   SDES BLOOD LEFT HAND 09/25/2019 0345   SPECREQUEST  10/01/2019 0345    BOTTLES DRAWN AEROBIC ONLY Blood Culture results may not be optimal due to an inadequate volume of blood received in culture bottles   CULT  10/23/2019 0345    NO GROWTH 3 DAYS Performed at [facility], [place] [address] [place], [place] [address]    REPTSTATUS PENDING 10/21/2019 0345   Studies/Results: Dg Chest Port 1 View  Result Date: 10/28/2019 CLINICAL DATA:  Fever and chills. EXAM: PORTABLE CHEST 1 VIEW COMPARISON:  Chest CT from yesterday FINDINGS: Diffuse opacification of the left chest with similar appearance when compared to scanogram from CT yesterday. This was from atelectasis, airspace opacification, pleural fluid, and pseudoaneurysm. There is aortic stent graft with left subclavian coil embolization. Dialysis catheter on the right with tip at the SVC. Lower tracheal deviation due to stent graft. IMPRESSION: 1. Unchanged appearance of the chest when compared to CT yesterday. 2. Diffuse left chest opacification which is a combination of aortic pseudoaneurysm, atelectasis, and airspace disease. Electronically Signed   By: Monte Fantasia M.D.   On: October 28, 2019 07:05   Ct Angio Chest Aorta W/cm & Or Wo/cm  Result Date: 09/29/2019 CLINICAL DATA:  Thoracic aortic aneurysm. EXAM: CT ANGIOGRAPHY CHEST WITH CONTRAST TECHNIQUE: Multidetector CT imaging of the chest was performed using the standard protocol during  bolus administration of intravenous contrast. Multiplanar CT image reconstructions and MIPs were obtained to evaluate the vascular anatomy. CONTRAST:  138mL OMNIPAQUE IOHEXOL 350 MG/ML SOLN COMPARISON:  September 26, 2019. FINDINGS: Cardiovascular: There appears to have been interval placement of stent graft more proximally into the transverse aortic arch. There also appears to have been coil embolization involving the origin of the left subclavian artery, with interval placement of bypass graft extending from left common carotid artery into more distal left subclavian artery. The pre-existing stent graft appears to be widely patent and extends as far distally as the diaphragmatic hiatus. There remains a crescent-shaped collection of contrast around the distal portion of the stent which  communicates with at least the right-sided lumbar branch at this level, suggesting type 2 endoleak. There is again noted severe aneurysmal dilatation of the distal portion of the transverse aortic arch and descending thoracic aorta which is predominantly low density. It has a maximum measured diameter of approximately 11 cm. There does not appear to be any definite evidence of endoleak seen in this proximal portion of the descending thoracic aorta. This most likely represents residual hemorrhage or hematoma from the previous type 1 endoleak involving the proximal portion of the stent. No definite active extravasation is seen. Mediastinum/Nodes: Thyroid gland is unremarkable. Displacement of the trachea and esophagus is noted to the right due to the large descending thoracic aortic aneurysm. Food debris is seen within the midportion of the esophagus. No definite adenopathy is noted. Lungs/Pleura: No pneumothorax is noted. Small left pleural effusion is noted with adjacent atelectasis of the left lower lobe. Minimal right basilar subsegmental atelectasis is noted. Upper Abdomen: No acute abnormality. Musculoskeletal: No chest wall abnormality. No acute or significant osseous findings. Review of the MIP images confirms the above findings. IMPRESSION: There is been interval proximal extension of thoracic aortic stent graft since prior exam, for treatment of type 1 endoleak noted on prior exam. The type 1 endoleak appears to have been resolved. The patient has also undergone embolization of the origin of the left subclavian artery, with creation of bypass graft extending from left common carotid artery to more distal left subclavian artery. There is significant enlargement of the aneurysm involving the distal transverse aortic arch and descending thoracic aorta, now measuring 11 cm. However, no definite active extravasation is seen in this portion of the excluded aneurysmal sac, and this therefore most likely represents  residual hematoma from the prior type 1 endoleak noted proximally on prior exam. There is a persistent type 2 endoleak around the distal portion of the stent graft near the diaphragmatic hiatus, which appears to be supplied by at least 1 posterior lumbar artery. This is not significantly changed compared to prior exam. Small left pleural effusion is noted with adjacent atelectasis of the left lower lobe. Minimal to mild right posterior basilar subsegmental atelectasis is noted as well. Electronically Signed   By: Marijo Conception M.D.   On: 09/29/2019 15:42   Medications: . sodium chloride    . sodium chloride    . sodium chloride    . sodium chloride    . magnesium sulfate bolus IVPB    . piperacillin-tazobactam (ZOSYN)  IV Stopped (17-Oct-2019 1358)   . sodium chloride   Intravenous Once  . sodium chloride   Intravenous Once  . amLODipine  10 mg Oral QHS  . atorvastatin  20 mg Oral q1800  . citalopram  10 mg Oral Daily  . cloNIDine  0.1 mg Oral BID  .  docusate sodium  100 mg Oral Daily  . feeding supplement (ENSURE ENLIVE)  237 mL Oral BID BM  . feeding supplement (PRO-STAT SUGAR FREE 64)  30 mL Oral BID  . heparin injection (subcutaneous)  5,000 Units Subcutaneous Q8H  . multivitamin  1 tablet Oral QHS  . mupirocin ointment  1 application Nasal BID  . pantoprazole  40 mg Oral Daily  . sodium chloride flush  3 mL Intravenous Q12H  . witch hazel-glycerin   Topical TID AC & HS    Dialysis Orders: TTS at [facility] 4:15hr, 400/A1.5, EDW 74kg, 2K/2Ca, TDC, no heparin - Getting course of IV venofer - Mircera 175mcg IV q 2 weeks  Assessment/Plan: 1.Chest pain -> Symptomatic Pseudoaneurysm with Type 1 endoleak: S/p extensive vascular repair 12/5 by Dr. Scot Dock (L carotid subclavian bypass, endovascular repair thoracic aortic pseudoaneurysm, coil embolization L subclavian artery). Per vascular surgery. 2. End-stage renal disease: Continue HD per usual TTS sched - next 12/9. 3.  Fever/lethargy (12/8): Cultures pending - started on Vanc/Zosyn - now in ICU. ?possible pneumonia. 4. Anemia of ESRD with acute blood loss secondary to endoleak: Hgb 7.5 today. 4U PRBCs 12/5, ?another unit 12/8. Will give Aranesp 200 with HD. 5. Hypertension:BP low this morning - improving now. Labetalol on hold. 6. Secondary hyperparathyroidism:CorrCa and Phos slightly high - not on binders or VDRA as outpatient.  7.  Nutrition: Alb very low - continue Ensure and pro-stat supplements.  Veneta Penton, PA-C Oct 22, 2019, 2:13 PM  [facility] Pager: [phone]

## 2019-10-24 NOTE — Progress Notes (Signed)
Patient B.P. down 86/60 . Keeps taking o2 off and thinks his Dog is here and he has [place] home. Mindy R.N. rapid resp. Made aware. Call Dr. Donzetta Matters and made aware of fever earlier tonight and V.S. and patient being sleepy and ABG's done earlier and made aware of the results. Stat PCXR ,CBC BMP L.A. order. And N.S. 500 ml bolus order

## 2019-10-24 NOTE — Progress Notes (Signed)
Pt actively coding, code team in room.   Chaplain called pt's mom to update and provide support and notify mom of pt's critical condition. Mom unable to get to hospital. Mom updated by this RN, aware pt's heart has stopped, CPR being performed, mom aware pt has poor prognosis and unlikely to survive. Mom understands but unable to drive to hospital. Emotional support provided over the phone by this RN. Assured mom we would call back with any updates.

## 2019-10-24 NOTE — Progress Notes (Signed)
Walked out of another patient's room to see Frank Fuller's call light was going off. Went into the room and patient with eyes rolled in the back of his head. HR in the 50s and sats in the 60s. Patient with cardiac rhythm and pulse at this time. Attempted to sternal rub patient for response. Blood and blood clots began to come from patient's mouth. This RN called for help. Subsequently, patient was starting to brady down with PEA and eventual asystole. Code blue started -- see code blue sheet. See provider notes.   Family notified. Time of death called at 02/09/23.

## 2019-10-24 NOTE — Procedures (Signed)
Bronchoscopy Procedure Note Frank Fuller [id number] [birth year]  Procedure: Bronchoscopy Indications: difficult to bag, hemoptysis  Procedure Details Consent: Unable to obtain consent because of emergent medical necessity.   In preparation for procedure, patient was being bagged during cardiac arrest. Sedation: none  Airway entered and the following bronchi were examined:  Small blood clot removed from et tube.   Large blood clot overlying carina.  Was not able to remove with suction despite multiple attempts.  Inspected past the carina during pause during chest compressions, clot appeared to extend bilaterally into subsegmental bronchi on both the left and the right.  Could not visualize continued bleeding, only clot.  No active bleeding from the airway, just extensive clot on both sides.    Evaluation Hemodynamic Status:cardiac arrest ; O2 sats: 0-40% Patient's Current Condition: unstable  There were some apparent blood clots near his airway that I am told were pulled out of the airway on intubation.  One appeared about 2cm in diameter and very spongy and filled with small air sacks.  Could be a cast of the trachea?  Additional clot appears like typical airway cast clot.  Sending large spongy  clot like material for pathology.    Frank Fuller 2019/10/19

## 2019-10-24 NOTE — Progress Notes (Signed)
Mindy R.N. w/  Rapid resp. aware of ABG results and recent V.S.

## 2019-10-24 NOTE — Progress Notes (Signed)
PT Cancellation Note  Patient Details Name: Silvester Withrow MRN: [id number] DOB: 1961/08/28   Cancelled Treatment:    Reason Eval/Treat Not Completed: Medical issues which prohibited therapy Pt about to be transferred to [place] due to hypotension and confusion. Will follow.   Marguarite Arbour A Wendelin Reader October 18, 2019, 9:12 AM Marisa Severin, PT, DPT Acute Rehabilitation Services Pager [phone] Office [phone]

## 2019-10-24 NOTE — Anesthesia Procedure Notes (Signed)
Procedure Name: Intubation Date/Time: 10-18-19 7:59 PM Performed by: Oletta Lamas, CRNA Pre-anesthesia Checklist: Patient identified, Emergency Drugs available, Suction available and Patient being monitored Patient Re-evaluated:Patient Re-evaluated prior to induction Oxygen Delivery Method: Circle System Utilized Preoxygenation: Pre-oxygenation with 100% oxygen Ventilation: Mask ventilation without difficulty Laryngoscope Size: Mac and 3 Tube type: Oral Tube size: 7.5 mm Number of attempts: 1 Airway Equipment and Method: Stylet Placement Confirmation: ETT inserted through vocal cords under direct vision,  breath sounds checked- equal and bilateral and CO2 detector Secured at: 23 cm Tube secured with: Tape Dental Injury: Teeth and Oropharynx as per pre-operative assessment  Comments: Code blue in progress. 1st attempt successful but ET tube came out when compressions were resumed prior to RRT securing tube.  Reintubated with 7.5 ett without incident.

## 2019-10-24 NOTE — Significant Event (Addendum)
Rapid Response Event Note  Overview: Called d/t lethargy. Pt here s/p L carotid  bypass/endovascular repair of thoracic aortic pseudoaneurysm. Pt has been out of ICU since 12/6.  Time Called: 0024 Arrival Time: 0030 Event Type: Neurologic  Initial Focused Assessment: Pt laying in bed with eyes closed, in no distress. Pt will open eyes, follow commands, and move all extremities only with repeated verbal stimuli. Pt alert and oriented while awake but falls back to sleep very easily. Pupils 4 and brisk, lungs diminished t/o, pt with congested cough. Pt unable to cough to clear secretions. Pt able to pull 250 on IS with repeated verbal stimulation. Pt given total of 2 tab norco and 2mg  dilaudid on day shift and is an HD pt. T-100.3, HR-94, BP-111/69, RR-25, SpO2-96% on 2L [place], CBG-109. Interventions: 0.4mg  narcan given x 1 Plan of Care (if not transferred): Pt now more awake and able to clear secretions-large amount bloody sputum coughed up. Alert MD of happenings. Continue to monitor pt. Call RRT if further assistance needed.  0340-Update: RN called because pt lethargic again with congested breathing, VSS. Will get ABG.7.40/38.6/82.5/23.5 0640-Update: SBP-80s, pt confused: 500cc NS bolus x 1, PCXR, CBC, BMP, LA PCXR: 1. Unchanged appearance of the chest when compared to CT yesterday. 2. Diffuse left chest opacification which is a combination of aortic pseudoaneurysm, atelectasis, and airspace disease., RR-20s, mildly increased WOB,SpO2-98% on 2L [place]. L lungs sounds with little air movement, R lung sounds diminished t/o. Event Summary:   at      at          [facility], Carren Rang

## 2019-10-24 NOTE — Care Management Important Message (Signed)
Important Message  Patient Details  Name: Frank Fuller MRN: [id number] Date of Birth: 1960/11/17   Medicare Important Message Given:  Yes     Shelda Altes 10/15/19, 12:51 PM

## 2019-10-24 NOTE — Progress Notes (Signed)
[facility]  Subjective  - confused lethargic   Objective (!) 89/61 83 98.5 F (36.9 C) (Oral) 19 99%  Intake/Output Summary (Last 24 hours) at 10-13-19 0741 Last data filed at Oct 13, 2019 0606 Gross per 24 hour  Intake 180 ml  Output 1 ml  Net 179 ml   2+ DP pulse no left pedal pulse 2+ brachial 2+ right radial Incisions groin and supraclavicular healing No tenderness over right side catheter  Chest xray and CT reviewed.  Mediastinal aortic hematoma no rupture Maybe some early LLL pneumonia  ABG PO 2 85  CBC    Component Value Date/Time   WBC 13.3 (H) 13-Oct-2019 0635   RBC 2.88 (L) 10/13/2019 0635   HGB 7.5 (L) 2019-10-13 0635   HCT 23.1 (L) 13-Oct-2019 0635   PLT 242 10/13/19 0635   MCV 80.2 10-13-2019 0635   MCH 26.0 October 13, 2019 0635   MCHC 32.5 10/13/2019 0635   RDW 18.6 (H) 10/13/2019 0635   LYMPHSABS 0.8 10/20/2019 2005   MONOABS 1.4 (H) 09/25/2019 2005   EOSABS 0.0 09/23/2019 2005   BASOSABS 0.0 10/07/2019 2005    BMET    Component Value Date/Time   NA 132 (L) October 13, 2019 0635   K 4.9 Oct 13, 2019 0635   CL 94 (L) Oct 13, 2019 0635   CO2 22 10/13/19 0635   GLUCOSE 99 October 13, 2019 0635   BUN 49 (H) 13-Oct-2019 0635   CREATININE 7.44 (H) 10/13/2019 0635   CALCIUM 8.9 October 13, 2019 0635   GFRNONAA 7 (L) October 13, 2019 0635   GFRAA 8 (L) Oct 13, 2019 0635     Assessment/Planning: Blood culture Transfer to ICU for worsening confusion hypoxia Transfuse 2 u rbc for symptomatic acute blood loss anemia HD today No narcotics  Ruta Hinds 10-13-2019 7:41 AM --  Laboratory Lab Results: Recent Labs    09/28/19 0531 13-Oct-2019 0635  WBC 11.2* 13.3*  HGB 9.0* 7.5*  HCT 27.7* 23.1*  PLT 272 242   BMET Recent Labs    09/28/2019 1503 09/28/19 0531  NA 131* 134*  K 5.6* 3.8  CL 96* 96*  CO2 24 27  GLUCOSE 120* 95  BUN 38* 17  CREATININE 6.76* 3.63*  CALCIUM 8.6* 8.3*    COAG Lab Results  Component Value Date   INR 1.4 (H) 10/22/2019   INR 1.4 (H) 10/03/2019   INR 1.3 (H) 06/17/2019   No results found for: PTT

## 2019-10-24 DEATH — deceased

## 2019-12-04 IMAGING — CT CT ABD-PELV W/O CM
2 of 4 series · 16 of 46 positions shown, 18 images · non-contrast
Comparison: June 22, 2019

CLINICAL DATA: GI bleed

EXAM:
CT ABDOMEN AND PELVIS WITHOUT CONTRAST
TECHNIQUE: Multidetector CT imaging of the abdomen and pelvis was performed
following the standard protocol without IV contrast.

[Series 3: a/p w/o 5mm · axial · non-contrast · 0.82mm/px · z∈[+834,+1299]mm · 13 of 103 slices shown, 15 images]
[im 5/103  soft-tissue]
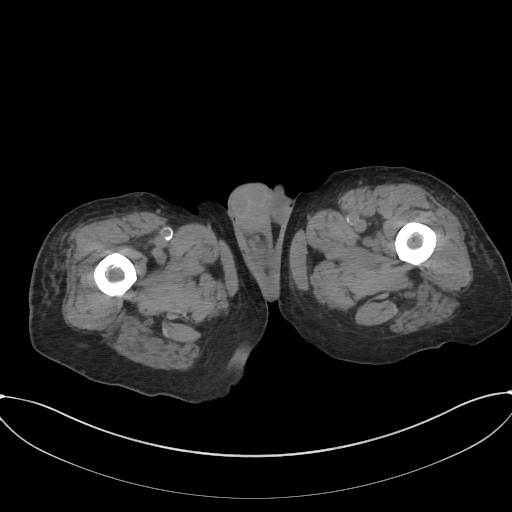
[im 5/103  bone]
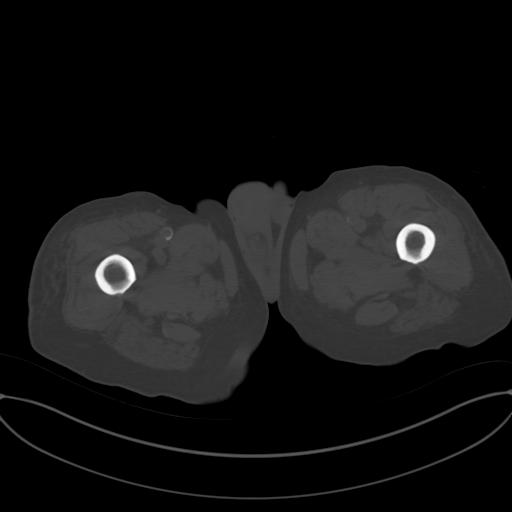
[im 13/103  soft-tissue]
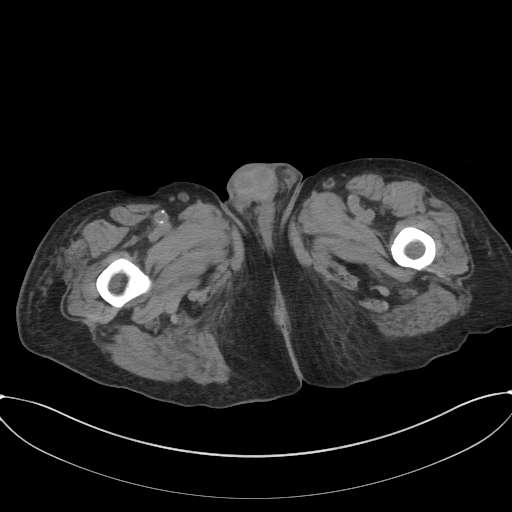
[im 22/103  soft-tissue]
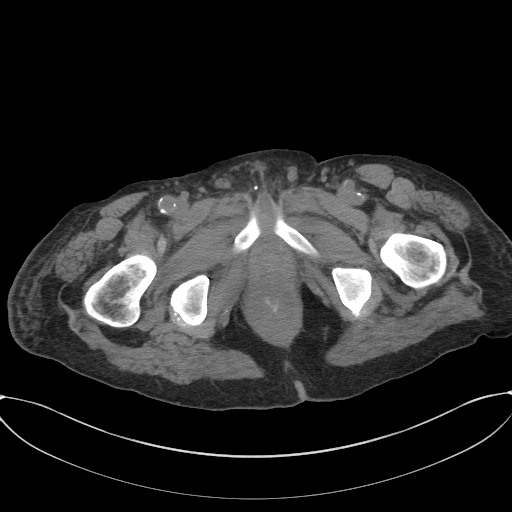
[im 30/103  soft-tissue]
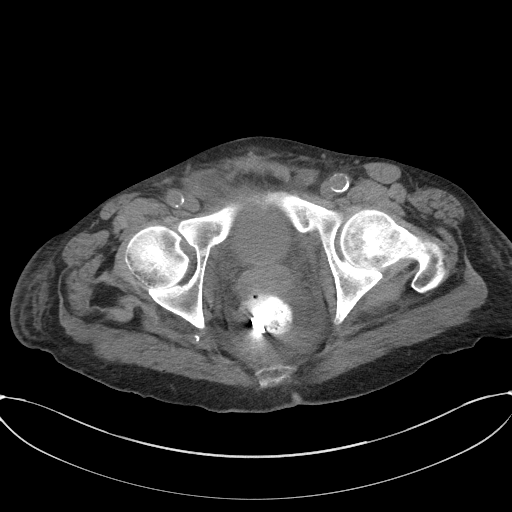
[im 35/103  soft-tissue]
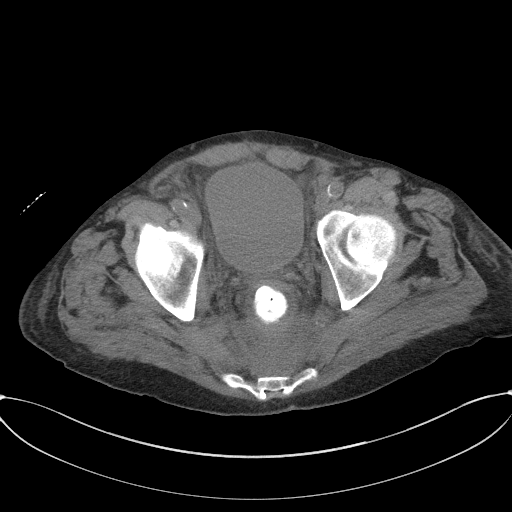
[im 43/103  soft-tissue]
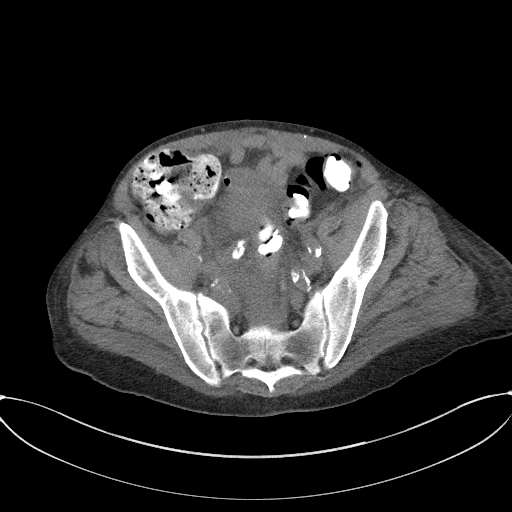
[im 52/103  soft-tissue]
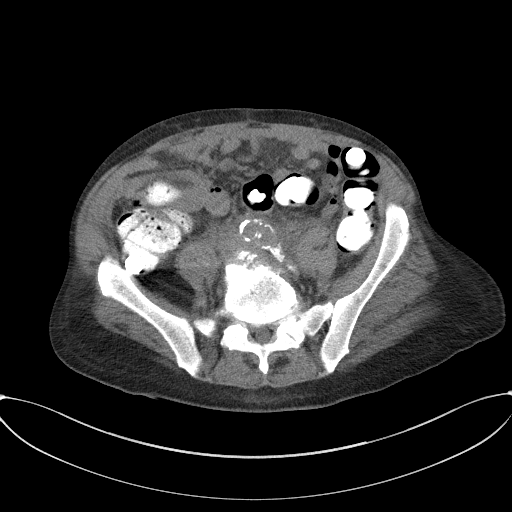
[im 60/103  soft-tissue]
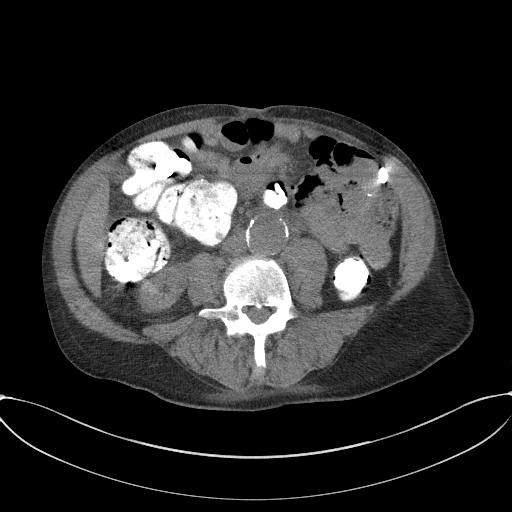
[im 69/103  soft-tissue]
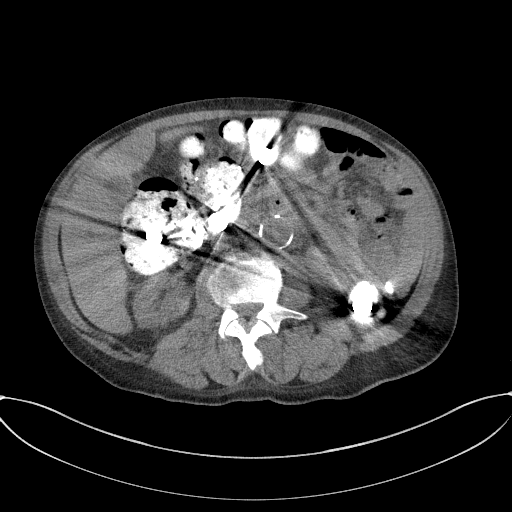
[im 69/103  bone]
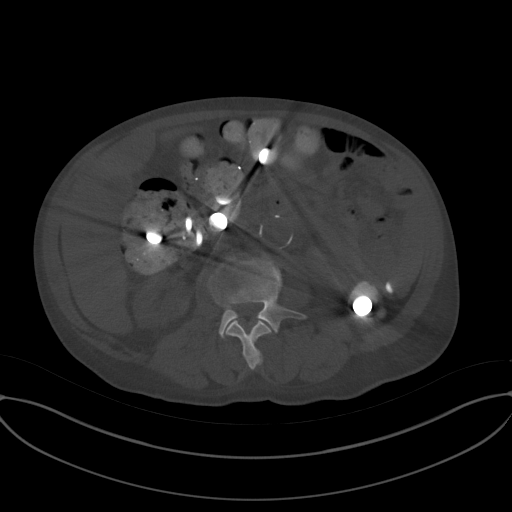
[im 73/103  soft-tissue]
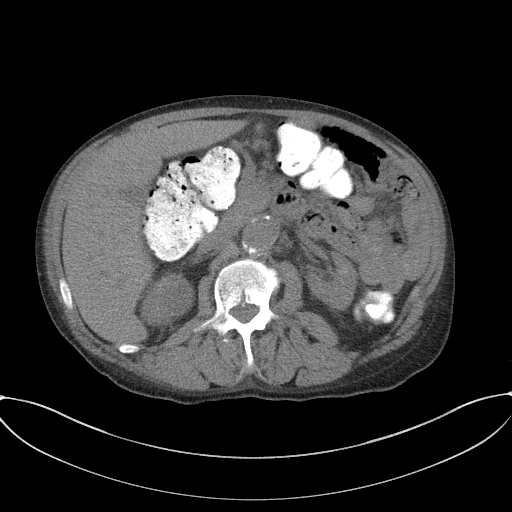
[im 81/103  soft-tissue]
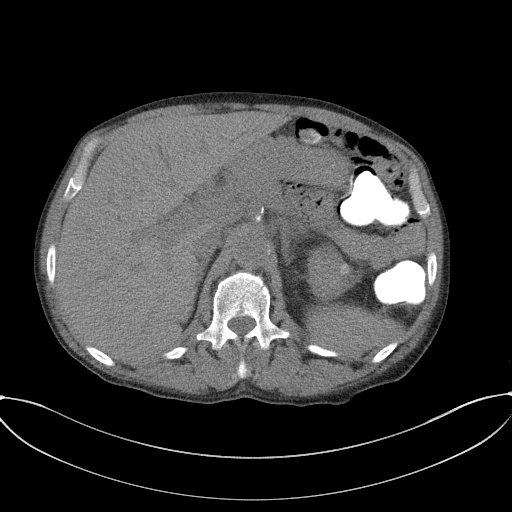
[im 90/103  soft-tissue]
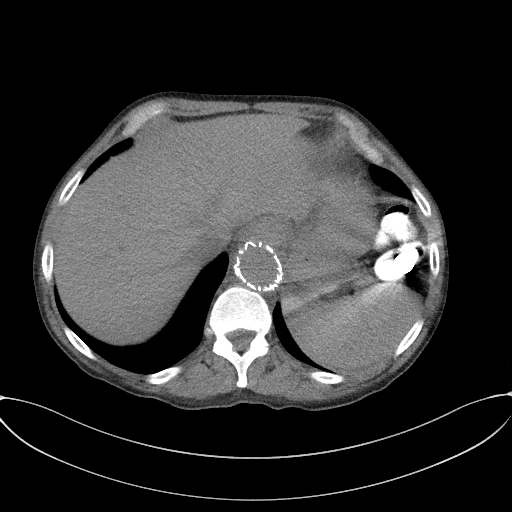
[im 98/103  soft-tissue]
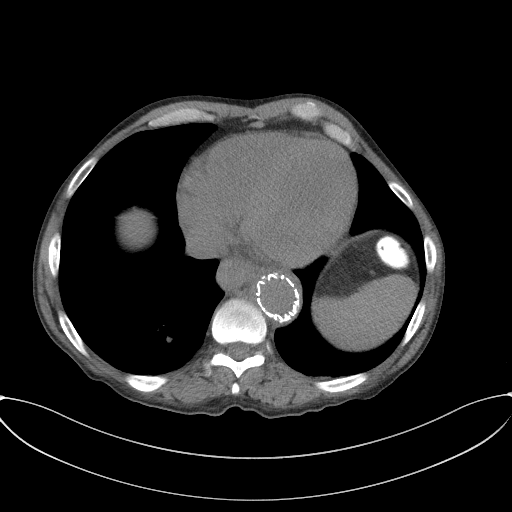

[Series 6: a/p w/o cor · coronal · non-contrast · 0.85mm/px · 3 of 151 slices shown]
[im 51/151  soft-tissue]
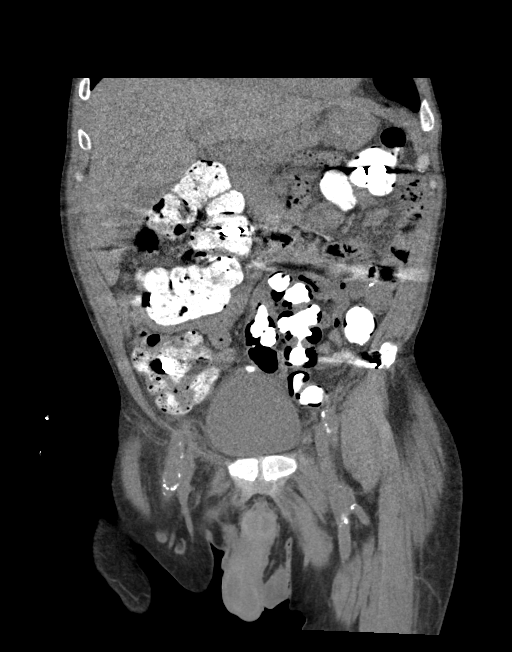
[im 67/151  soft-tissue]
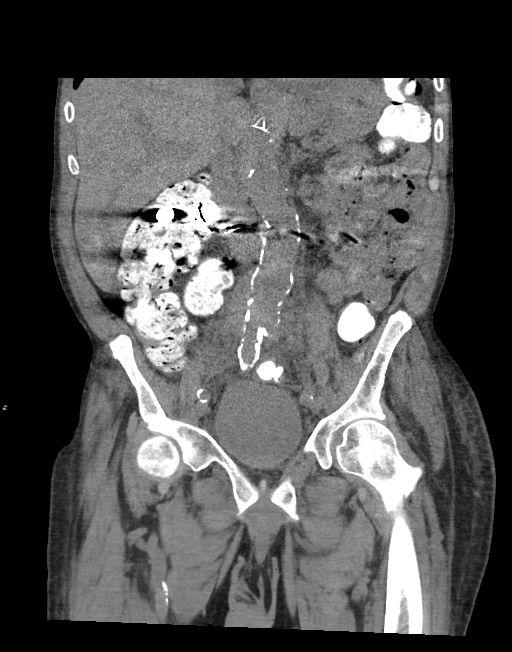
[im 84/151  soft-tissue]
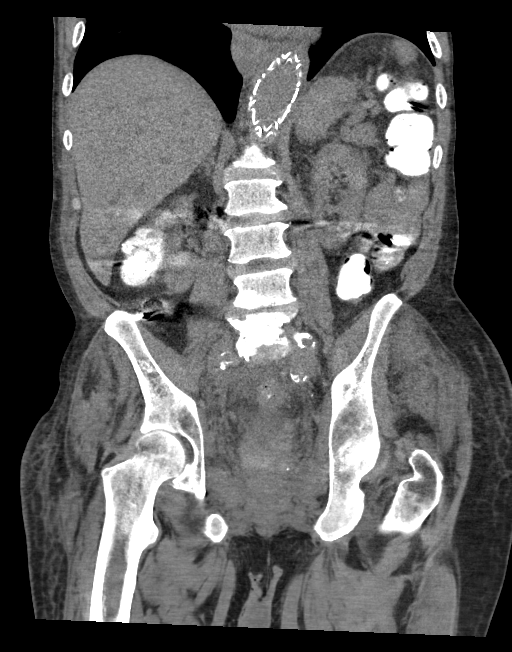

[16 of 46 positions shown; findings below may reference images not displayed]

FINDINGS: Lower chest: The visualized heart size within normal limits. No
pericardial fluid/thickening.

Again noted is a thoracic aorta stent graft. A small hiatal hernia
is seen.

The visualized portions of the lungs are clear.

Hepatobiliary: Although limited due to the lack of intravenous
contrast, normal in appearance without gross focal abnormality. No
evidence of calcified gallstones or biliary ductal dilatation.

Pancreas:  Unremarkable.  No surrounding inflammatory changes.

Spleen: Normal in size. Although limited due to the lack of
intravenous contrast, normal in appearance.

Adrenals/Urinary Tract: Both adrenal glands appear normal. Bilateral
renal atrophy seen.

Stomach/Bowel: The stomach and small bowel are grossly unremarkable.
There is a moderate amount of colonic stool. Within the presacral
and pre coccygeal space there is increased fat stranding,
heterogeneous soft tissue density, and ill-defined mesenteric edema.
No definite loculated fluid collection is seen. There appears to be
circumferential rectal bowel wall thickening as well.

Vascular/Lymphatic: There are no enlarged abdominal or pelvic lymph
nodes. Scattered aortic atherosclerotic calcifications are seen
without aneurysmal dilatation.

Reproductive: The prostate is unremarkable.

Other: No evidence of abdominal wall mass or hernia.  No free air.

Musculoskeletal: No acute or significant osseous findings.
IMPRESSION: 1. Interval slight worsening in the presacral and pre coccygeal
ill-defined heterogeneous edema and soft tissue density with
circumferential rectal wall thickening. This is likely related to
patient's gastrointestinal bleed. No definite loculated fluid
collection

## 2020-01-27 LAB — ALDOSTERONE + RENIN ACTIVITY W/ RATIO
ALDO / PRA Ratio: 1.5 (ref 0.0–30.0)
Aldosterone: 1.8 ng/dL (ref 0.0–30.0)
PRA LC/MS/MS: 1.168 ng/mL/hr (ref 0.167–5.380)

## 2022-07-20 NOTE — Telephone Encounter (Signed)
Opened in error
